# Patient Record
Sex: Female | Born: 1950 | Race: Black or African American | Hispanic: No | Marital: Single | Smoking: Former smoker
Health system: Southern US, Community
[De-identification: ages and names within clinical notes are randomized; demographics above are authoritative.]

## PROBLEM LIST (undated history)

## (undated) ENCOUNTER — Emergency Department (HOSPITAL_COMMUNITY): Admission: EM | Payer: Medicare Other | Source: Home / Self Care

## (undated) DIAGNOSIS — H9190 Unspecified hearing loss, unspecified ear: Secondary | ICD-10-CM

## (undated) DIAGNOSIS — R531 Weakness: Secondary | ICD-10-CM

## (undated) DIAGNOSIS — M8580 Other specified disorders of bone density and structure, unspecified site: Secondary | ICD-10-CM

## (undated) DIAGNOSIS — F79 Unspecified intellectual disabilities: Secondary | ICD-10-CM

## (undated) DIAGNOSIS — I951 Orthostatic hypotension: Secondary | ICD-10-CM

## (undated) DIAGNOSIS — N183 Chronic kidney disease, stage 3 unspecified: Secondary | ICD-10-CM

## (undated) DIAGNOSIS — I5030 Unspecified diastolic (congestive) heart failure: Secondary | ICD-10-CM

## (undated) DIAGNOSIS — K08109 Complete loss of teeth, unspecified cause, unspecified class: Secondary | ICD-10-CM

## (undated) DIAGNOSIS — F419 Anxiety disorder, unspecified: Secondary | ICD-10-CM

## (undated) DIAGNOSIS — F32A Depression, unspecified: Secondary | ICD-10-CM

## (undated) DIAGNOSIS — T466X5A Adverse effect of antihyperlipidemic and antiarteriosclerotic drugs, initial encounter: Secondary | ICD-10-CM

## (undated) DIAGNOSIS — R569 Unspecified convulsions: Secondary | ICD-10-CM

## (undated) DIAGNOSIS — R112 Nausea with vomiting, unspecified: Secondary | ICD-10-CM

## (undated) DIAGNOSIS — I69398 Other sequelae of cerebral infarction: Secondary | ICD-10-CM

## (undated) DIAGNOSIS — N184 Chronic kidney disease, stage 4 (severe): Secondary | ICD-10-CM

## (undated) DIAGNOSIS — Z800 Family history of malignant neoplasm of digestive organs: Secondary | ICD-10-CM

## (undated) DIAGNOSIS — E785 Hyperlipidemia, unspecified: Secondary | ICD-10-CM

## (undated) DIAGNOSIS — R001 Bradycardia, unspecified: Secondary | ICD-10-CM

## (undated) DIAGNOSIS — I6529 Occlusion and stenosis of unspecified carotid artery: Secondary | ICD-10-CM

## (undated) DIAGNOSIS — F4310 Post-traumatic stress disorder, unspecified: Secondary | ICD-10-CM

## (undated) DIAGNOSIS — M791 Myalgia, unspecified site: Secondary | ICD-10-CM

## (undated) DIAGNOSIS — H539 Unspecified visual disturbance: Secondary | ICD-10-CM

## (undated) DIAGNOSIS — I639 Cerebral infarction, unspecified: Secondary | ICD-10-CM

## (undated) DIAGNOSIS — M109 Gout, unspecified: Secondary | ICD-10-CM

## (undated) DIAGNOSIS — G4733 Obstructive sleep apnea (adult) (pediatric): Secondary | ICD-10-CM

## (undated) DIAGNOSIS — I10 Essential (primary) hypertension: Secondary | ICD-10-CM

## (undated) DIAGNOSIS — I2510 Atherosclerotic heart disease of native coronary artery without angina pectoris: Secondary | ICD-10-CM

## (undated) DIAGNOSIS — R55 Syncope and collapse: Secondary | ICD-10-CM

## (undated) DIAGNOSIS — I493 Ventricular premature depolarization: Secondary | ICD-10-CM

## (undated) DIAGNOSIS — K279 Peptic ulcer, site unspecified, unspecified as acute or chronic, without hemorrhage or perforation: Secondary | ICD-10-CM

## (undated) DIAGNOSIS — E119 Type 2 diabetes mellitus without complications: Secondary | ICD-10-CM

## (undated) DIAGNOSIS — Z9889 Other specified postprocedural states: Secondary | ICD-10-CM

## (undated) DIAGNOSIS — I509 Heart failure, unspecified: Secondary | ICD-10-CM

## (undated) HISTORY — DX: Unspecified diastolic (congestive) heart failure: I50.30

## (undated) HISTORY — DX: Other specified disorders of bone density and structure, unspecified site: M85.80

## (undated) HISTORY — DX: Family history of malignant neoplasm of digestive organs: Z80.0

## (undated) HISTORY — PX: DENTAL SURGERY: SHX609

## (undated) HISTORY — PX: BLADDER SURGERY: SHX569

## (undated) HISTORY — DX: Chronic kidney disease, stage 4 (severe): N18.4

## (undated) HISTORY — PX: CARDIAC CATHETERIZATION: SHX172

## (undated) HISTORY — PX: CORONARY ANGIOPLASTY WITH STENT PLACEMENT: SHX49

## (undated) HISTORY — PX: OTHER SURGICAL HISTORY: SHX169

## (undated) HISTORY — DX: Gout, unspecified: M10.9

## (undated) HISTORY — DX: Hyperlipidemia, unspecified: E78.5

---

## 1997-11-08 ENCOUNTER — Inpatient Hospital Stay (HOSPITAL_COMMUNITY): Admission: EM | Admit: 1997-11-08 | Discharge: 1997-11-09 | Payer: Self-pay | Admitting: Emergency Medicine

## 1998-03-25 ENCOUNTER — Encounter: Payer: Self-pay | Admitting: Emergency Medicine

## 1998-03-25 ENCOUNTER — Inpatient Hospital Stay (HOSPITAL_COMMUNITY): Admission: EM | Admit: 1998-03-25 | Discharge: 1998-03-26 | Payer: Self-pay | Admitting: Emergency Medicine

## 1998-06-15 ENCOUNTER — Encounter: Admission: RE | Admit: 1998-06-15 | Discharge: 1998-06-15 | Payer: Self-pay | Admitting: Internal Medicine

## 1998-06-29 ENCOUNTER — Encounter: Admission: RE | Admit: 1998-06-29 | Discharge: 1998-06-29 | Payer: Self-pay | Admitting: Hematology and Oncology

## 1998-10-14 ENCOUNTER — Encounter: Admission: RE | Admit: 1998-10-14 | Discharge: 1998-10-14 | Payer: Self-pay | Admitting: Internal Medicine

## 1998-10-21 ENCOUNTER — Encounter: Admission: RE | Admit: 1998-10-21 | Discharge: 1998-10-21 | Payer: Self-pay | Admitting: Internal Medicine

## 1999-03-18 ENCOUNTER — Emergency Department (HOSPITAL_COMMUNITY): Admission: EM | Admit: 1999-03-18 | Discharge: 1999-03-18 | Payer: Self-pay | Admitting: Emergency Medicine

## 1999-05-16 ENCOUNTER — Emergency Department (HOSPITAL_COMMUNITY): Admission: EM | Admit: 1999-05-16 | Discharge: 1999-05-16 | Payer: Self-pay | Admitting: Emergency Medicine

## 1999-11-07 ENCOUNTER — Ambulatory Visit (HOSPITAL_COMMUNITY): Admission: RE | Admit: 1999-11-07 | Discharge: 1999-11-07 | Payer: Self-pay | Admitting: Internal Medicine

## 1999-11-13 ENCOUNTER — Encounter: Payer: Self-pay | Admitting: Internal Medicine

## 1999-11-13 ENCOUNTER — Ambulatory Visit (HOSPITAL_COMMUNITY): Admission: RE | Admit: 1999-11-13 | Discharge: 1999-11-13 | Payer: Self-pay | Admitting: Internal Medicine

## 1999-11-27 ENCOUNTER — Encounter: Admission: RE | Admit: 1999-11-27 | Discharge: 1999-11-27 | Payer: Self-pay | Admitting: Internal Medicine

## 2000-06-01 ENCOUNTER — Emergency Department (HOSPITAL_COMMUNITY): Admission: EM | Admit: 2000-06-01 | Discharge: 2000-06-01 | Payer: Self-pay | Admitting: Emergency Medicine

## 2000-06-11 ENCOUNTER — Encounter: Payer: Self-pay | Admitting: Emergency Medicine

## 2000-06-11 ENCOUNTER — Inpatient Hospital Stay (HOSPITAL_COMMUNITY): Admission: EM | Admit: 2000-06-11 | Discharge: 2000-06-14 | Payer: Self-pay | Admitting: Emergency Medicine

## 2000-06-12 ENCOUNTER — Encounter: Payer: Self-pay | Admitting: Internal Medicine

## 2000-07-25 ENCOUNTER — Encounter: Admission: RE | Admit: 2000-07-25 | Discharge: 2000-07-25 | Payer: Self-pay | Admitting: Internal Medicine

## 2001-08-01 ENCOUNTER — Encounter: Payer: Self-pay | Admitting: Emergency Medicine

## 2001-08-01 ENCOUNTER — Emergency Department (HOSPITAL_COMMUNITY): Admission: EM | Admit: 2001-08-01 | Discharge: 2001-08-01 | Payer: Self-pay | Admitting: Emergency Medicine

## 2001-11-03 ENCOUNTER — Encounter: Admission: RE | Admit: 2001-11-03 | Discharge: 2001-11-03 | Payer: Self-pay | Admitting: Internal Medicine

## 2001-11-03 ENCOUNTER — Encounter: Payer: Self-pay | Admitting: Internal Medicine

## 2001-11-03 ENCOUNTER — Ambulatory Visit (HOSPITAL_COMMUNITY): Admission: RE | Admit: 2001-11-03 | Discharge: 2001-11-03 | Payer: Self-pay | Admitting: Internal Medicine

## 2001-12-28 ENCOUNTER — Emergency Department (HOSPITAL_COMMUNITY): Admission: EM | Admit: 2001-12-28 | Discharge: 2001-12-28 | Payer: Self-pay | Admitting: Emergency Medicine

## 2002-09-21 ENCOUNTER — Inpatient Hospital Stay (HOSPITAL_COMMUNITY): Admission: EM | Admit: 2002-09-21 | Discharge: 2002-09-23 | Payer: Self-pay | Admitting: Emergency Medicine

## 2002-09-21 ENCOUNTER — Encounter: Payer: Self-pay | Admitting: Emergency Medicine

## 2002-09-21 ENCOUNTER — Encounter: Payer: Self-pay | Admitting: *Deleted

## 2002-09-22 ENCOUNTER — Encounter: Payer: Self-pay | Admitting: Internal Medicine

## 2004-02-06 ENCOUNTER — Emergency Department: Payer: Self-pay | Admitting: Emergency Medicine

## 2004-02-06 ENCOUNTER — Other Ambulatory Visit: Payer: Self-pay

## 2004-02-11 ENCOUNTER — Ambulatory Visit: Payer: Self-pay | Admitting: Internal Medicine

## 2004-02-11 ENCOUNTER — Ambulatory Visit (HOSPITAL_COMMUNITY): Admission: RE | Admit: 2004-02-11 | Discharge: 2004-02-11 | Payer: Self-pay | Admitting: Internal Medicine

## 2004-02-21 ENCOUNTER — Ambulatory Visit: Payer: Self-pay | Admitting: Internal Medicine

## 2004-03-20 ENCOUNTER — Encounter (INDEPENDENT_AMBULATORY_CARE_PROVIDER_SITE_OTHER): Payer: Self-pay | Admitting: Internal Medicine

## 2004-03-20 ENCOUNTER — Ambulatory Visit: Payer: Self-pay | Admitting: Internal Medicine

## 2004-03-21 ENCOUNTER — Encounter: Admission: RE | Admit: 2004-03-21 | Discharge: 2004-05-12 | Payer: Self-pay | Admitting: Internal Medicine

## 2004-06-12 ENCOUNTER — Ambulatory Visit (HOSPITAL_COMMUNITY): Admission: RE | Admit: 2004-06-12 | Discharge: 2004-06-12 | Payer: Self-pay | Admitting: Internal Medicine

## 2004-06-12 ENCOUNTER — Ambulatory Visit: Payer: Self-pay | Admitting: Internal Medicine

## 2004-06-21 ENCOUNTER — Ambulatory Visit (HOSPITAL_COMMUNITY): Admission: RE | Admit: 2004-06-21 | Discharge: 2004-06-21 | Payer: Self-pay | Admitting: Internal Medicine

## 2005-01-07 ENCOUNTER — Emergency Department: Payer: Self-pay | Admitting: Emergency Medicine

## 2005-04-10 ENCOUNTER — Other Ambulatory Visit: Payer: Self-pay

## 2005-04-10 ENCOUNTER — Emergency Department: Payer: Self-pay | Admitting: Emergency Medicine

## 2005-08-31 ENCOUNTER — Ambulatory Visit: Payer: Self-pay | Admitting: Internal Medicine

## 2005-09-05 ENCOUNTER — Ambulatory Visit: Payer: Self-pay | Admitting: Internal Medicine

## 2005-09-07 ENCOUNTER — Ambulatory Visit: Payer: Self-pay | Admitting: Internal Medicine

## 2005-09-10 ENCOUNTER — Encounter (INDEPENDENT_AMBULATORY_CARE_PROVIDER_SITE_OTHER): Payer: Self-pay | Admitting: Internal Medicine

## 2005-09-10 ENCOUNTER — Ambulatory Visit (HOSPITAL_COMMUNITY): Admission: RE | Admit: 2005-09-10 | Discharge: 2005-09-10 | Payer: Self-pay | Admitting: Internal Medicine

## 2005-09-17 ENCOUNTER — Ambulatory Visit: Payer: Self-pay | Admitting: Internal Medicine

## 2006-03-04 DIAGNOSIS — E669 Obesity, unspecified: Secondary | ICD-10-CM

## 2006-03-04 DIAGNOSIS — K219 Gastro-esophageal reflux disease without esophagitis: Secondary | ICD-10-CM | POA: Insufficient documentation

## 2006-03-04 DIAGNOSIS — I10 Essential (primary) hypertension: Secondary | ICD-10-CM | POA: Insufficient documentation

## 2006-03-04 DIAGNOSIS — M25569 Pain in unspecified knee: Secondary | ICD-10-CM | POA: Insufficient documentation

## 2006-03-04 DIAGNOSIS — K279 Peptic ulcer, site unspecified, unspecified as acute or chronic, without hemorrhage or perforation: Secondary | ICD-10-CM

## 2006-03-04 DIAGNOSIS — N951 Menopausal and female climacteric states: Secondary | ICD-10-CM | POA: Insufficient documentation

## 2006-03-04 DIAGNOSIS — M25559 Pain in unspecified hip: Secondary | ICD-10-CM | POA: Insufficient documentation

## 2006-03-04 DIAGNOSIS — Z8711 Personal history of peptic ulcer disease: Secondary | ICD-10-CM | POA: Insufficient documentation

## 2006-03-04 DIAGNOSIS — F411 Generalized anxiety disorder: Secondary | ICD-10-CM | POA: Insufficient documentation

## 2011-10-19 ENCOUNTER — Emergency Department: Payer: Self-pay | Admitting: Emergency Medicine

## 2013-01-06 ENCOUNTER — Emergency Department: Payer: Self-pay | Admitting: Emergency Medicine

## 2013-01-09 ENCOUNTER — Emergency Department: Payer: Self-pay | Admitting: Emergency Medicine

## 2013-01-13 ENCOUNTER — Emergency Department: Payer: Self-pay | Admitting: Internal Medicine

## 2014-04-11 ENCOUNTER — Inpatient Hospital Stay: Payer: Self-pay | Admitting: Internal Medicine

## 2014-04-11 DIAGNOSIS — N2889 Other specified disorders of kidney and ureter: Secondary | ICD-10-CM | POA: Diagnosis not present

## 2014-04-11 DIAGNOSIS — D649 Anemia, unspecified: Secondary | ICD-10-CM | POA: Diagnosis not present

## 2014-04-11 DIAGNOSIS — I10 Essential (primary) hypertension: Secondary | ICD-10-CM | POA: Diagnosis not present

## 2014-04-11 DIAGNOSIS — Z720 Tobacco use: Secondary | ICD-10-CM | POA: Diagnosis not present

## 2014-04-11 DIAGNOSIS — R197 Diarrhea, unspecified: Secondary | ICD-10-CM | POA: Diagnosis not present

## 2014-04-11 DIAGNOSIS — N139 Obstructive and reflux uropathy, unspecified: Secondary | ICD-10-CM | POA: Diagnosis not present

## 2014-04-11 DIAGNOSIS — N320 Bladder-neck obstruction: Secondary | ICD-10-CM | POA: Diagnosis not present

## 2014-04-11 DIAGNOSIS — R1030 Lower abdominal pain, unspecified: Secondary | ICD-10-CM | POA: Diagnosis not present

## 2014-04-11 DIAGNOSIS — R112 Nausea with vomiting, unspecified: Secondary | ICD-10-CM | POA: Diagnosis not present

## 2014-04-11 DIAGNOSIS — N179 Acute kidney failure, unspecified: Secondary | ICD-10-CM | POA: Diagnosis not present

## 2014-04-11 LAB — COMPREHENSIVE METABOLIC PANEL
ALT: 21 U/L
ANION GAP: 10 (ref 7–16)
Albumin: 3.9 g/dL (ref 3.4–5.0)
Alkaline Phosphatase: 68 U/L
BILIRUBIN TOTAL: 0.5 mg/dL (ref 0.2–1.0)
BUN: 56 mg/dL — AB (ref 7–18)
CALCIUM: 9.9 mg/dL (ref 8.5–10.1)
CREATININE: 5.92 mg/dL — AB (ref 0.60–1.30)
Chloride: 115 mmol/L — ABNORMAL HIGH (ref 98–107)
Co2: 18 mmol/L — ABNORMAL LOW (ref 21–32)
EGFR (African American): 9 — ABNORMAL LOW
GFR CALC NON AF AMER: 8 — AB
GLUCOSE: 88 mg/dL (ref 65–99)
Osmolality: 300 (ref 275–301)
Potassium: 5.1 mmol/L (ref 3.5–5.1)
SGOT(AST): 20 U/L (ref 15–37)
SODIUM: 143 mmol/L (ref 136–145)
Total Protein: 8 g/dL (ref 6.4–8.2)

## 2014-04-11 LAB — LIPASE, BLOOD: LIPASE: 197 U/L (ref 73–393)

## 2014-04-11 LAB — URINALYSIS, COMPLETE
Bilirubin,UR: NEGATIVE
Glucose,UR: NEGATIVE mg/dL (ref 0–75)
Ketone: NEGATIVE
Nitrite: NEGATIVE
PH: 7 (ref 4.5–8.0)
Protein: NEGATIVE
SPECIFIC GRAVITY: 1.005 (ref 1.003–1.030)
Squamous Epithelial: 3

## 2014-04-11 LAB — CBC WITH DIFFERENTIAL/PLATELET
BASOS PCT: 0.8 %
Basophil #: 0 10*3/uL (ref 0.0–0.1)
EOS PCT: 1.6 %
Eosinophil #: 0.1 10*3/uL (ref 0.0–0.7)
HCT: 32.9 % — ABNORMAL LOW (ref 35.0–47.0)
HGB: 10.7 g/dL — AB (ref 12.0–16.0)
LYMPHS PCT: 22.7 %
Lymphocyte #: 0.8 10*3/uL — ABNORMAL LOW (ref 1.0–3.6)
MCH: 31.9 pg (ref 26.0–34.0)
MCHC: 32.6 g/dL (ref 32.0–36.0)
MCV: 98 fL (ref 80–100)
Monocyte #: 0.3 x10 3/mm (ref 0.2–0.9)
Monocyte %: 9.1 %
NEUTROS PCT: 65.8 %
Neutrophil #: 2.3 10*3/uL (ref 1.4–6.5)
PLATELETS: 151 10*3/uL (ref 150–440)
RBC: 3.36 10*6/uL — ABNORMAL LOW (ref 3.80–5.20)
RDW: 12.5 % (ref 11.5–14.5)
WBC: 3.5 10*3/uL — ABNORMAL LOW (ref 3.6–11.0)

## 2014-04-12 DIAGNOSIS — N179 Acute kidney failure, unspecified: Secondary | ICD-10-CM | POA: Diagnosis not present

## 2014-04-12 DIAGNOSIS — N320 Bladder-neck obstruction: Secondary | ICD-10-CM | POA: Diagnosis not present

## 2014-04-12 DIAGNOSIS — R9431 Abnormal electrocardiogram [ECG] [EKG]: Secondary | ICD-10-CM | POA: Diagnosis not present

## 2014-04-12 DIAGNOSIS — D649 Anemia, unspecified: Secondary | ICD-10-CM | POA: Diagnosis not present

## 2014-04-12 DIAGNOSIS — I10 Essential (primary) hypertension: Secondary | ICD-10-CM | POA: Diagnosis not present

## 2014-04-12 LAB — CK TOTAL AND CKMB (NOT AT ARMC)
CK, TOTAL: 88 U/L (ref 26–192)
CK-MB: 2.5 ng/mL (ref 0.5–3.6)

## 2014-04-12 LAB — BASIC METABOLIC PANEL
ANION GAP: 8 (ref 7–16)
BUN: 44 mg/dL — ABNORMAL HIGH (ref 7–18)
CALCIUM: 9 mg/dL (ref 8.5–10.1)
CHLORIDE: 115 mmol/L — AB (ref 98–107)
CREATININE: 4.4 mg/dL — AB (ref 0.60–1.30)
Co2: 21 mmol/L (ref 21–32)
EGFR (African American): 13 — ABNORMAL LOW
GFR CALC NON AF AMER: 11 — AB
Glucose: 86 mg/dL (ref 65–99)
OSMOLALITY: 297 (ref 275–301)
POTASSIUM: 4.9 mmol/L (ref 3.5–5.1)
Sodium: 144 mmol/L (ref 136–145)

## 2014-04-12 LAB — CBC WITH DIFFERENTIAL/PLATELET
BASOS ABS: 0 10*3/uL (ref 0.0–0.1)
Basophil %: 0.7 %
Eosinophil #: 0.1 10*3/uL (ref 0.0–0.7)
Eosinophil %: 2.1 %
HCT: 32.3 % — ABNORMAL LOW (ref 35.0–47.0)
HGB: 10.6 g/dL — AB (ref 12.0–16.0)
LYMPHS PCT: 19.1 %
Lymphocyte #: 0.9 10*3/uL — ABNORMAL LOW (ref 1.0–3.6)
MCH: 32 pg (ref 26.0–34.0)
MCHC: 32.8 g/dL (ref 32.0–36.0)
MCV: 98 fL (ref 80–100)
MONOS PCT: 6.8 %
Monocyte #: 0.3 x10 3/mm (ref 0.2–0.9)
Neutrophil #: 3.2 10*3/uL (ref 1.4–6.5)
Neutrophil %: 71.3 %
PLATELETS: 164 10*3/uL (ref 150–440)
RBC: 3.3 10*6/uL — ABNORMAL LOW (ref 3.80–5.20)
RDW: 12.6 % (ref 11.5–14.5)
WBC: 4.5 10*3/uL (ref 3.6–11.0)

## 2014-04-12 LAB — CLOSTRIDIUM DIFFICILE(ARMC)

## 2014-04-12 LAB — TROPONIN I
TROPONIN-I: 0.03 ng/mL
TROPONIN-I: 0.03 ng/mL
Troponin-I: 0.03 ng/mL

## 2014-04-13 DIAGNOSIS — N320 Bladder-neck obstruction: Secondary | ICD-10-CM | POA: Diagnosis not present

## 2014-04-13 DIAGNOSIS — I10 Essential (primary) hypertension: Secondary | ICD-10-CM | POA: Diagnosis not present

## 2014-04-13 DIAGNOSIS — R198 Other specified symptoms and signs involving the digestive system and abdomen: Secondary | ICD-10-CM | POA: Diagnosis not present

## 2014-04-13 DIAGNOSIS — N1330 Unspecified hydronephrosis: Secondary | ICD-10-CM | POA: Diagnosis not present

## 2014-04-13 DIAGNOSIS — D649 Anemia, unspecified: Secondary | ICD-10-CM | POA: Diagnosis not present

## 2014-04-13 DIAGNOSIS — N179 Acute kidney failure, unspecified: Secondary | ICD-10-CM | POA: Diagnosis not present

## 2014-04-13 LAB — CBC WITH DIFFERENTIAL/PLATELET
Basophil #: 0 10*3/uL (ref 0.0–0.1)
Basophil %: 0.5 %
EOS PCT: 2 %
Eosinophil #: 0.1 10*3/uL (ref 0.0–0.7)
HCT: 31.8 % — ABNORMAL LOW (ref 35.0–47.0)
HGB: 10.3 g/dL — ABNORMAL LOW (ref 12.0–16.0)
Lymphocyte #: 1.1 10*3/uL (ref 1.0–3.6)
Lymphocyte %: 24 %
MCH: 31.7 pg (ref 26.0–34.0)
MCHC: 32.3 g/dL (ref 32.0–36.0)
MCV: 98 fL (ref 80–100)
MONOS PCT: 8.5 %
Monocyte #: 0.4 x10 3/mm (ref 0.2–0.9)
NEUTROS PCT: 65 %
Neutrophil #: 3 10*3/uL (ref 1.4–6.5)
Platelet: 149 10*3/uL — ABNORMAL LOW (ref 150–440)
RBC: 3.24 10*6/uL — ABNORMAL LOW (ref 3.80–5.20)
RDW: 12.7 % (ref 11.5–14.5)
WBC: 4.6 10*3/uL (ref 3.6–11.0)

## 2014-04-13 LAB — BASIC METABOLIC PANEL
Anion Gap: 8 (ref 7–16)
BUN: 29 mg/dL — AB (ref 7–18)
CALCIUM: 9.1 mg/dL (ref 8.5–10.1)
Chloride: 115 mmol/L — ABNORMAL HIGH (ref 98–107)
Co2: 20 mmol/L — ABNORMAL LOW (ref 21–32)
Creatinine: 3.3 mg/dL — ABNORMAL HIGH (ref 0.60–1.30)
EGFR (African American): 18 — ABNORMAL LOW
EGFR (Non-African Amer.): 15 — ABNORMAL LOW
Glucose: 90 mg/dL (ref 65–99)
OSMOLALITY: 290 (ref 275–301)
POTASSIUM: 4.8 mmol/L (ref 3.5–5.1)
Sodium: 143 mmol/L (ref 136–145)

## 2014-04-13 LAB — URINE CULTURE

## 2014-04-14 DIAGNOSIS — D649 Anemia, unspecified: Secondary | ICD-10-CM | POA: Diagnosis not present

## 2014-04-14 DIAGNOSIS — I10 Essential (primary) hypertension: Secondary | ICD-10-CM | POA: Diagnosis not present

## 2014-04-14 DIAGNOSIS — N179 Acute kidney failure, unspecified: Secondary | ICD-10-CM | POA: Diagnosis not present

## 2014-04-14 DIAGNOSIS — N320 Bladder-neck obstruction: Secondary | ICD-10-CM | POA: Diagnosis not present

## 2014-04-14 DIAGNOSIS — Z720 Tobacco use: Secondary | ICD-10-CM | POA: Diagnosis not present

## 2014-04-14 LAB — BASIC METABOLIC PANEL
Anion Gap: 6 — ABNORMAL LOW (ref 7–16)
BUN: 22 mg/dL — ABNORMAL HIGH (ref 7–18)
Calcium, Total: 8.7 mg/dL (ref 8.5–10.1)
Chloride: 113 mmol/L — ABNORMAL HIGH (ref 98–107)
Co2: 23 mmol/L (ref 21–32)
Creatinine: 2.51 mg/dL — ABNORMAL HIGH (ref 0.60–1.30)
EGFR (African American): 25 — ABNORMAL LOW
GFR CALC NON AF AMER: 21 — AB
Glucose: 84 mg/dL (ref 65–99)
OSMOLALITY: 286 (ref 275–301)
POTASSIUM: 4.1 mmol/L (ref 3.5–5.1)
Sodium: 142 mmol/L (ref 136–145)

## 2014-04-14 LAB — CBC WITH DIFFERENTIAL/PLATELET
Basophil #: 0 10*3/uL (ref 0.0–0.1)
Basophil %: 0.4 %
Eosinophil #: 0.1 10*3/uL (ref 0.0–0.7)
Eosinophil %: 3.7 %
HCT: 32.7 % — ABNORMAL LOW (ref 35.0–47.0)
HGB: 10.6 g/dL — ABNORMAL LOW (ref 12.0–16.0)
Lymphocyte #: 1 10*3/uL (ref 1.0–3.6)
Lymphocyte %: 24.9 %
MCH: 31.5 pg (ref 26.0–34.0)
MCHC: 32.6 g/dL (ref 32.0–36.0)
MCV: 97 fL (ref 80–100)
Monocyte #: 0.3 x10 3/mm (ref 0.2–0.9)
Monocyte %: 7.7 %
NEUTROS ABS: 2.4 10*3/uL (ref 1.4–6.5)
Neutrophil %: 63.3 %
PLATELETS: 175 10*3/uL (ref 150–440)
RBC: 3.38 10*6/uL — AB (ref 3.80–5.20)
RDW: 12.1 % (ref 11.5–14.5)
WBC: 3.8 10*3/uL (ref 3.6–11.0)

## 2014-04-14 LAB — WBCS, STOOL

## 2014-04-15 DIAGNOSIS — D649 Anemia, unspecified: Secondary | ICD-10-CM | POA: Diagnosis not present

## 2014-04-15 DIAGNOSIS — Z720 Tobacco use: Secondary | ICD-10-CM | POA: Diagnosis not present

## 2014-04-15 DIAGNOSIS — I10 Essential (primary) hypertension: Secondary | ICD-10-CM | POA: Diagnosis not present

## 2014-04-15 DIAGNOSIS — N320 Bladder-neck obstruction: Secondary | ICD-10-CM | POA: Diagnosis not present

## 2014-04-15 DIAGNOSIS — N179 Acute kidney failure, unspecified: Secondary | ICD-10-CM | POA: Diagnosis not present

## 2014-04-15 LAB — BASIC METABOLIC PANEL
Anion Gap: 6 — ABNORMAL LOW (ref 7–16)
BUN: 21 mg/dL — ABNORMAL HIGH (ref 7–18)
CALCIUM: 9.2 mg/dL (ref 8.5–10.1)
CHLORIDE: 113 mmol/L — AB (ref 98–107)
CREATININE: 2.37 mg/dL — AB (ref 0.60–1.30)
Co2: 22 mmol/L (ref 21–32)
EGFR (African American): 27 — ABNORMAL LOW
GFR CALC NON AF AMER: 22 — AB
GLUCOSE: 107 mg/dL — AB (ref 65–99)
OSMOLALITY: 285 (ref 275–301)
Potassium: 4.2 mmol/L (ref 3.5–5.1)
Sodium: 141 mmol/L (ref 136–145)

## 2014-04-16 LAB — STOOL CULTURE

## 2014-04-21 ENCOUNTER — Ambulatory Visit: Payer: Self-pay | Admitting: Nephrology

## 2014-04-21 DIAGNOSIS — R339 Retention of urine, unspecified: Secondary | ICD-10-CM | POA: Diagnosis not present

## 2014-04-21 DIAGNOSIS — N179 Acute kidney failure, unspecified: Secondary | ICD-10-CM | POA: Diagnosis not present

## 2014-04-21 LAB — BASIC METABOLIC PANEL
Anion Gap: 4 — ABNORMAL LOW (ref 7–16)
BUN: 28 mg/dL — AB (ref 7–18)
CHLORIDE: 108 mmol/L — AB (ref 98–107)
Calcium, Total: 9.7 mg/dL (ref 8.5–10.1)
Co2: 26 mmol/L (ref 21–32)
Creatinine: 1.95 mg/dL — ABNORMAL HIGH (ref 0.60–1.30)
EGFR (African American): 33 — ABNORMAL LOW
EGFR (Non-African Amer.): 27 — ABNORMAL LOW
GLUCOSE: 104 mg/dL — AB (ref 65–99)
Osmolality: 281 (ref 275–301)
Potassium: 4.4 mmol/L (ref 3.5–5.1)
Sodium: 138 mmol/L (ref 136–145)

## 2014-05-10 DIAGNOSIS — R339 Retention of urine, unspecified: Secondary | ICD-10-CM | POA: Diagnosis not present

## 2014-05-10 DIAGNOSIS — N184 Chronic kidney disease, stage 4 (severe): Secondary | ICD-10-CM | POA: Diagnosis not present

## 2014-05-10 DIAGNOSIS — I10 Essential (primary) hypertension: Secondary | ICD-10-CM | POA: Diagnosis not present

## 2014-05-10 DIAGNOSIS — I509 Heart failure, unspecified: Secondary | ICD-10-CM | POA: Diagnosis not present

## 2014-05-10 DIAGNOSIS — E119 Type 2 diabetes mellitus without complications: Secondary | ICD-10-CM | POA: Diagnosis not present

## 2014-05-12 DIAGNOSIS — N179 Acute kidney failure, unspecified: Secondary | ICD-10-CM | POA: Diagnosis not present

## 2014-05-12 DIAGNOSIS — E1129 Type 2 diabetes mellitus with other diabetic kidney complication: Secondary | ICD-10-CM | POA: Diagnosis not present

## 2014-05-12 DIAGNOSIS — N390 Urinary tract infection, site not specified: Secondary | ICD-10-CM | POA: Diagnosis not present

## 2014-05-12 DIAGNOSIS — D649 Anemia, unspecified: Secondary | ICD-10-CM | POA: Diagnosis not present

## 2014-05-12 DIAGNOSIS — E559 Vitamin D deficiency, unspecified: Secondary | ICD-10-CM | POA: Diagnosis not present

## 2014-05-12 DIAGNOSIS — I10 Essential (primary) hypertension: Secondary | ICD-10-CM | POA: Diagnosis not present

## 2014-05-12 DIAGNOSIS — N189 Chronic kidney disease, unspecified: Secondary | ICD-10-CM | POA: Diagnosis not present

## 2014-05-25 DIAGNOSIS — N179 Acute kidney failure, unspecified: Secondary | ICD-10-CM | POA: Diagnosis not present

## 2014-05-25 DIAGNOSIS — Z466 Encounter for fitting and adjustment of urinary device: Secondary | ICD-10-CM | POA: Diagnosis not present

## 2014-05-25 DIAGNOSIS — R339 Retention of urine, unspecified: Secondary | ICD-10-CM | POA: Diagnosis not present

## 2014-05-31 DIAGNOSIS — R531 Weakness: Secondary | ICD-10-CM

## 2014-05-31 HISTORY — DX: Weakness: R53.1

## 2014-06-03 DIAGNOSIS — R339 Retention of urine, unspecified: Secondary | ICD-10-CM | POA: Diagnosis not present

## 2014-06-08 DIAGNOSIS — N2581 Secondary hyperparathyroidism of renal origin: Secondary | ICD-10-CM | POA: Diagnosis not present

## 2014-06-08 DIAGNOSIS — N183 Chronic kidney disease, stage 3 (moderate): Secondary | ICD-10-CM | POA: Diagnosis not present

## 2014-06-08 DIAGNOSIS — I10 Essential (primary) hypertension: Secondary | ICD-10-CM | POA: Diagnosis not present

## 2014-06-08 DIAGNOSIS — D631 Anemia in chronic kidney disease: Secondary | ICD-10-CM | POA: Diagnosis not present

## 2014-06-16 ENCOUNTER — Emergency Department: Payer: Self-pay | Admitting: Emergency Medicine

## 2014-06-16 DIAGNOSIS — Z79899 Other long term (current) drug therapy: Secondary | ICD-10-CM | POA: Diagnosis not present

## 2014-06-16 DIAGNOSIS — Z87891 Personal history of nicotine dependence: Secondary | ICD-10-CM | POA: Diagnosis not present

## 2014-06-16 DIAGNOSIS — E119 Type 2 diabetes mellitus without complications: Secondary | ICD-10-CM | POA: Diagnosis not present

## 2014-06-16 DIAGNOSIS — R404 Transient alteration of awareness: Secondary | ICD-10-CM | POA: Diagnosis not present

## 2014-06-16 DIAGNOSIS — I10 Essential (primary) hypertension: Secondary | ICD-10-CM | POA: Diagnosis not present

## 2014-06-16 DIAGNOSIS — R42 Dizziness and giddiness: Secondary | ICD-10-CM | POA: Diagnosis not present

## 2014-06-22 ENCOUNTER — Inpatient Hospital Stay: Payer: Self-pay | Admitting: Internal Medicine

## 2014-06-22 DIAGNOSIS — I10 Essential (primary) hypertension: Secondary | ICD-10-CM | POA: Diagnosis not present

## 2014-06-22 DIAGNOSIS — N390 Urinary tract infection, site not specified: Secondary | ICD-10-CM | POA: Diagnosis not present

## 2014-06-22 DIAGNOSIS — R42 Dizziness and giddiness: Secondary | ICD-10-CM | POA: Diagnosis not present

## 2014-06-22 DIAGNOSIS — R29818 Other symptoms and signs involving the nervous system: Secondary | ICD-10-CM | POA: Diagnosis not present

## 2014-06-22 DIAGNOSIS — I6523 Occlusion and stenosis of bilateral carotid arteries: Secondary | ICD-10-CM | POA: Diagnosis not present

## 2014-06-22 DIAGNOSIS — I63532 Cerebral infarction due to unspecified occlusion or stenosis of left posterior cerebral artery: Secondary | ICD-10-CM

## 2014-06-22 DIAGNOSIS — I5030 Unspecified diastolic (congestive) heart failure: Secondary | ICD-10-CM | POA: Diagnosis not present

## 2014-06-22 DIAGNOSIS — I639 Cerebral infarction, unspecified: Secondary | ICD-10-CM | POA: Diagnosis not present

## 2014-06-22 DIAGNOSIS — R531 Weakness: Secondary | ICD-10-CM | POA: Diagnosis not present

## 2014-06-22 HISTORY — DX: Cerebral infarction due to unspecified occlusion or stenosis of left posterior cerebral artery: I63.532

## 2014-06-23 ENCOUNTER — Ambulatory Visit: Payer: Self-pay | Admitting: Neurology

## 2014-06-23 DIAGNOSIS — I639 Cerebral infarction, unspecified: Secondary | ICD-10-CM | POA: Diagnosis not present

## 2014-06-23 DIAGNOSIS — E119 Type 2 diabetes mellitus without complications: Secondary | ICD-10-CM | POA: Diagnosis not present

## 2014-06-23 DIAGNOSIS — I10 Essential (primary) hypertension: Secondary | ICD-10-CM | POA: Diagnosis not present

## 2014-06-23 DIAGNOSIS — N390 Urinary tract infection, site not specified: Secondary | ICD-10-CM | POA: Diagnosis not present

## 2014-06-24 DIAGNOSIS — I10 Essential (primary) hypertension: Secondary | ICD-10-CM | POA: Diagnosis not present

## 2014-06-24 DIAGNOSIS — N390 Urinary tract infection, site not specified: Secondary | ICD-10-CM | POA: Diagnosis not present

## 2014-06-24 DIAGNOSIS — I639 Cerebral infarction, unspecified: Secondary | ICD-10-CM | POA: Diagnosis not present

## 2014-06-30 ENCOUNTER — Emergency Department: Payer: Self-pay | Admitting: Emergency Medicine

## 2014-06-30 DIAGNOSIS — I639 Cerebral infarction, unspecified: Secondary | ICD-10-CM | POA: Diagnosis not present

## 2014-06-30 DIAGNOSIS — I10 Essential (primary) hypertension: Secondary | ICD-10-CM | POA: Diagnosis not present

## 2014-06-30 DIAGNOSIS — I638 Other cerebral infarction: Secondary | ICD-10-CM | POA: Diagnosis not present

## 2014-06-30 DIAGNOSIS — E119 Type 2 diabetes mellitus without complications: Secondary | ICD-10-CM | POA: Diagnosis not present

## 2014-06-30 DIAGNOSIS — R209 Unspecified disturbances of skin sensation: Secondary | ICD-10-CM | POA: Diagnosis not present

## 2014-07-02 DIAGNOSIS — I10 Essential (primary) hypertension: Secondary | ICD-10-CM | POA: Diagnosis not present

## 2014-07-02 DIAGNOSIS — Z8673 Personal history of transient ischemic attack (TIA), and cerebral infarction without residual deficits: Secondary | ICD-10-CM | POA: Diagnosis not present

## 2014-07-02 DIAGNOSIS — E785 Hyperlipidemia, unspecified: Secondary | ICD-10-CM | POA: Diagnosis not present

## 2014-07-02 DIAGNOSIS — I509 Heart failure, unspecified: Secondary | ICD-10-CM | POA: Diagnosis not present

## 2014-07-02 DIAGNOSIS — I2510 Atherosclerotic heart disease of native coronary artery without angina pectoris: Secondary | ICD-10-CM | POA: Diagnosis not present

## 2014-07-05 DIAGNOSIS — N189 Chronic kidney disease, unspecified: Secondary | ICD-10-CM | POA: Diagnosis not present

## 2014-07-05 DIAGNOSIS — R339 Retention of urine, unspecified: Secondary | ICD-10-CM | POA: Diagnosis not present

## 2014-07-05 DIAGNOSIS — I10 Essential (primary) hypertension: Secondary | ICD-10-CM | POA: Diagnosis not present

## 2014-07-05 DIAGNOSIS — E669 Obesity, unspecified: Secondary | ICD-10-CM | POA: Diagnosis not present

## 2014-07-12 DIAGNOSIS — R339 Retention of urine, unspecified: Secondary | ICD-10-CM | POA: Diagnosis not present

## 2014-07-17 ENCOUNTER — Emergency Department: Payer: Self-pay | Admitting: Emergency Medicine

## 2014-07-17 DIAGNOSIS — R112 Nausea with vomiting, unspecified: Secondary | ICD-10-CM | POA: Diagnosis not present

## 2014-07-17 DIAGNOSIS — R197 Diarrhea, unspecified: Secondary | ICD-10-CM | POA: Diagnosis not present

## 2014-07-17 DIAGNOSIS — I10 Essential (primary) hypertension: Secondary | ICD-10-CM | POA: Diagnosis not present

## 2014-07-17 DIAGNOSIS — N3090 Cystitis, unspecified without hematuria: Secondary | ICD-10-CM | POA: Diagnosis not present

## 2014-07-17 DIAGNOSIS — E119 Type 2 diabetes mellitus without complications: Secondary | ICD-10-CM | POA: Diagnosis not present

## 2014-07-24 ENCOUNTER — Emergency Department: Payer: Self-pay | Admitting: Internal Medicine

## 2014-07-24 DIAGNOSIS — R030 Elevated blood-pressure reading, without diagnosis of hypertension: Secondary | ICD-10-CM | POA: Diagnosis not present

## 2014-07-24 DIAGNOSIS — I10 Essential (primary) hypertension: Secondary | ICD-10-CM | POA: Diagnosis not present

## 2014-07-24 DIAGNOSIS — F419 Anxiety disorder, unspecified: Secondary | ICD-10-CM | POA: Diagnosis not present

## 2014-07-24 DIAGNOSIS — I517 Cardiomegaly: Secondary | ICD-10-CM | POA: Diagnosis not present

## 2014-07-24 DIAGNOSIS — I639 Cerebral infarction, unspecified: Secondary | ICD-10-CM | POA: Diagnosis not present

## 2014-07-24 DIAGNOSIS — R51 Headache: Secondary | ICD-10-CM | POA: Diagnosis not present

## 2014-07-24 DIAGNOSIS — Z79899 Other long term (current) drug therapy: Secondary | ICD-10-CM | POA: Diagnosis not present

## 2014-07-24 DIAGNOSIS — E119 Type 2 diabetes mellitus without complications: Secondary | ICD-10-CM | POA: Diagnosis not present

## 2014-07-24 DIAGNOSIS — R110 Nausea: Secondary | ICD-10-CM | POA: Diagnosis not present

## 2014-07-24 LAB — COMPREHENSIVE METABOLIC PANEL
ALK PHOS: 53 U/L
ALT: 29 U/L
Albumin: 4.1 g/dL
Anion Gap: 9 (ref 7–16)
BUN: 22 mg/dL — ABNORMAL HIGH
Bilirubin,Total: 0.9 mg/dL
CREATININE: 1.6 mg/dL — AB
Calcium, Total: 9.5 mg/dL
Chloride: 107 mmol/L
Co2: 23 mmol/L
EGFR (African American): 39 — ABNORMAL LOW
EGFR (Non-African Amer.): 33 — ABNORMAL LOW
Glucose: 112 mg/dL — ABNORMAL HIGH
Potassium: 4.7 mmol/L
SGOT(AST): 31 U/L
SODIUM: 139 mmol/L
Total Protein: 7.3 g/dL

## 2014-07-24 LAB — URINALYSIS, COMPLETE
Bacteria: NONE SEEN
Bilirubin,UR: NEGATIVE
Blood: NEGATIVE
GLUCOSE, UR: NEGATIVE mg/dL (ref 0–75)
Ketone: NEGATIVE
Leukocyte Esterase: NEGATIVE
Nitrite: NEGATIVE
Ph: 5 (ref 4.5–8.0)
Protein: NEGATIVE
SPECIFIC GRAVITY: 1.008 (ref 1.003–1.030)
Squamous Epithelial: 1

## 2014-07-24 LAB — CBC
HCT: 41.3 % (ref 35.0–47.0)
HGB: 13.4 g/dL (ref 12.0–16.0)
MCH: 31.2 pg (ref 26.0–34.0)
MCHC: 32.4 g/dL (ref 32.0–36.0)
MCV: 96 fL (ref 80–100)
Platelet: 205 10*3/uL (ref 150–440)
RBC: 4.29 10*6/uL (ref 3.80–5.20)
RDW: 12.8 % (ref 11.5–14.5)
WBC: 4.9 10*3/uL (ref 3.6–11.0)

## 2014-07-24 LAB — TROPONIN I

## 2014-08-02 DIAGNOSIS — N3941 Urge incontinence: Secondary | ICD-10-CM | POA: Diagnosis not present

## 2014-08-02 DIAGNOSIS — R339 Retention of urine, unspecified: Secondary | ICD-10-CM | POA: Diagnosis not present

## 2014-08-09 ENCOUNTER — Emergency Department
Admit: 2014-08-09 | Disposition: A | Discharge status: Home / Self Care | Payer: Self-pay | Admitting: Emergency Medicine

## 2014-08-09 DIAGNOSIS — R110 Nausea: Secondary | ICD-10-CM | POA: Diagnosis not present

## 2014-08-09 DIAGNOSIS — J019 Acute sinusitis, unspecified: Secondary | ICD-10-CM | POA: Diagnosis not present

## 2014-08-09 DIAGNOSIS — I6789 Other cerebrovascular disease: Secondary | ICD-10-CM | POA: Diagnosis not present

## 2014-08-09 DIAGNOSIS — I10 Essential (primary) hypertension: Secondary | ICD-10-CM | POA: Diagnosis not present

## 2014-08-09 DIAGNOSIS — J309 Allergic rhinitis, unspecified: Secondary | ICD-10-CM | POA: Diagnosis not present

## 2014-08-09 DIAGNOSIS — R4781 Slurred speech: Secondary | ICD-10-CM | POA: Diagnosis not present

## 2014-08-11 DIAGNOSIS — R0982 Postnasal drip: Secondary | ICD-10-CM | POA: Diagnosis not present

## 2014-08-11 DIAGNOSIS — J309 Allergic rhinitis, unspecified: Secondary | ICD-10-CM | POA: Diagnosis not present

## 2014-08-11 DIAGNOSIS — R110 Nausea: Secondary | ICD-10-CM | POA: Diagnosis not present

## 2014-08-11 DIAGNOSIS — Z8673 Personal history of transient ischemic attack (TIA), and cerebral infarction without residual deficits: Secondary | ICD-10-CM | POA: Diagnosis not present

## 2014-08-17 ENCOUNTER — Encounter (HOSPITAL_COMMUNITY): Payer: Self-pay | Admitting: Emergency Medicine

## 2014-08-17 ENCOUNTER — Emergency Department (HOSPITAL_COMMUNITY)
Admission: EM | Admit: 2014-08-17 | Discharge: 2014-08-17 | Disposition: A | Discharge status: Home / Self Care | Payer: Medicare Other | Attending: Emergency Medicine | Admitting: Emergency Medicine

## 2014-08-17 DIAGNOSIS — R55 Syncope and collapse: Secondary | ICD-10-CM

## 2014-08-17 DIAGNOSIS — Z87891 Personal history of nicotine dependence: Secondary | ICD-10-CM | POA: Insufficient documentation

## 2014-08-17 DIAGNOSIS — I509 Heart failure, unspecified: Secondary | ICD-10-CM | POA: Diagnosis not present

## 2014-08-17 DIAGNOSIS — N289 Disorder of kidney and ureter, unspecified: Secondary | ICD-10-CM | POA: Diagnosis not present

## 2014-08-17 DIAGNOSIS — I252 Old myocardial infarction: Secondary | ICD-10-CM | POA: Insufficient documentation

## 2014-08-17 DIAGNOSIS — E119 Type 2 diabetes mellitus without complications: Secondary | ICD-10-CM | POA: Diagnosis not present

## 2014-08-17 DIAGNOSIS — Z8673 Personal history of transient ischemic attack (TIA), and cerebral infarction without residual deficits: Secondary | ICD-10-CM | POA: Insufficient documentation

## 2014-08-17 DIAGNOSIS — R110 Nausea: Secondary | ICD-10-CM | POA: Insufficient documentation

## 2014-08-17 DIAGNOSIS — R404 Transient alteration of awareness: Secondary | ICD-10-CM | POA: Diagnosis not present

## 2014-08-17 HISTORY — DX: Cerebral infarction, unspecified: I63.9

## 2014-08-17 HISTORY — DX: Heart failure, unspecified: I50.9

## 2014-08-17 LAB — CBC WITH DIFFERENTIAL/PLATELET
BASOS ABS: 0 10*3/uL (ref 0.0–0.1)
Basophils Relative: 0 % (ref 0–1)
EOS PCT: 1 % (ref 0–5)
Eosinophils Absolute: 0.1 10*3/uL (ref 0.0–0.7)
HCT: 39.7 % (ref 36.0–46.0)
Hemoglobin: 13.2 g/dL (ref 12.0–15.0)
LYMPHS PCT: 40 % (ref 12–46)
Lymphs Abs: 1.6 10*3/uL (ref 0.7–4.0)
MCH: 31.7 pg (ref 26.0–34.0)
MCHC: 33.2 g/dL (ref 30.0–36.0)
MCV: 95.2 fL (ref 78.0–100.0)
Monocytes Absolute: 0.2 10*3/uL (ref 0.1–1.0)
Monocytes Relative: 6 % (ref 3–12)
Neutro Abs: 2.1 10*3/uL (ref 1.7–7.7)
Neutrophils Relative %: 53 % (ref 43–77)
PLATELETS: 205 10*3/uL (ref 150–400)
RBC: 4.17 MIL/uL (ref 3.87–5.11)
RDW: 12.2 % (ref 11.5–15.5)
WBC: 4 10*3/uL (ref 4.0–10.5)

## 2014-08-17 LAB — COMPREHENSIVE METABOLIC PANEL
ALBUMIN: 4.1 g/dL (ref 3.5–5.2)
ALK PHOS: 57 U/L (ref 39–117)
ALT: 19 U/L (ref 0–35)
ANION GAP: 9 (ref 5–15)
AST: 18 U/L (ref 0–37)
BUN: 14 mg/dL (ref 6–23)
CALCIUM: 9.9 mg/dL (ref 8.4–10.5)
CO2: 27 mmol/L (ref 19–32)
CREATININE: 1.48 mg/dL — AB (ref 0.50–1.10)
Chloride: 104 mmol/L (ref 96–112)
GFR calc non Af Amer: 36 mL/min — ABNORMAL LOW (ref >90)
GFR, EST AFRICAN AMERICAN: 42 mL/min — AB (ref >90)
GLUCOSE: 81 mg/dL (ref 70–99)
Potassium: 3.8 mmol/L (ref 3.5–5.1)
Sodium: 140 mmol/L (ref 135–145)
TOTAL PROTEIN: 7.3 g/dL (ref 6.0–8.3)
Total Bilirubin: 0.7 mg/dL (ref 0.3–1.2)

## 2014-08-17 LAB — URINALYSIS, ROUTINE W REFLEX MICROSCOPIC
Bilirubin Urine: NEGATIVE
Glucose, UA: NEGATIVE mg/dL
HGB URINE DIPSTICK: NEGATIVE
Ketones, ur: NEGATIVE mg/dL
Leukocytes, UA: NEGATIVE
NITRITE: NEGATIVE
Protein, ur: NEGATIVE mg/dL
SPECIFIC GRAVITY, URINE: 1.007 (ref 1.005–1.030)
UROBILINOGEN UA: 0.2 mg/dL (ref 0.0–1.0)
pH: 7 (ref 5.0–8.0)

## 2014-08-17 LAB — TROPONIN I: Troponin I: <0.03 ng/mL (ref <0.031)

## 2014-08-17 MED ORDER — CLONIDINE HCL 0.2 MG PO TABS: 0.2000 mg | ORAL_TABLET | Freq: Two times a day (BID) | ORAL | Status: DC

## 2014-08-17 MED ORDER — ONDANSETRON HCL 4 MG/2ML IJ SOLN
4.0000 mg | Freq: Once | INTRAMUSCULAR | Status: AC
  Administered 2014-08-17: 4 mg via INTRAVENOUS
  Filled 2014-08-17: qty 2

## 2014-08-17 NOTE — ED Notes (Addendum)
Per EMS, pt had near syncopal episode. Pt had recent stroke in January. Pt noted to have difficult speaking and pt sts that is baseline. Pt c/o abdominal pain and right eye pressure after taking new medication hydrochlorothiazide . EMS vitals 148/68, 73HR, 98% Rm Air, CBG 72

## 2014-08-17 NOTE — ED Provider Notes (Signed)
CSN: [id number]     Arrival date & time 08/17/14  1710 History   First MD Initiated Contact with Patient 08/17/14 1743     Chief Complaint  Patient presents with  . Near Syncope     (Consider location/radiation/quality/duration/timing/severity/associated sxs/prior Treatment) Patient is a 64 y.o. female presenting with near-syncope. The history is provided by the patient.  Near Syncope  She relates that she was started on a blood pressure medicine after a stroke that she suffered 2 months ago. It is an orange pill which she takes 3 times a day and she frequently develops nausea after taking it. She will also have some episodes of lightheadedness. She had a similar episode today. She had to sit down but did not pass out. There is no associated chest pain, heaviness, tightness, pressure. There was no diaphoresis. Symptoms have resolved. She states that it is not consistent. There are times she takes the medication she does not get sick but she never had problems before being put on a medication and only occurs after taking it.  Past Medical History  Diagnosis Date  . Stroke   . CHF (congestive heart failure)   . Diabetes mellitus without complication   . MI (myocardial infarction)    History reviewed. No pertinent past surgical history. History reviewed. No pertinent family history. History  Substance Use Topics  . Smoking status: Former Smoker    Quit date: 03/30/2014  . Smokeless tobacco: Not on file  . Alcohol Use: No   OB History    No data available     Review of Systems  Cardiovascular: Positive for near-syncope.  All other systems reviewed and are negative.     Allergies  Review of patient's allergies indicates no known allergies.  Home Medications   Prior to Admission medications   Not on File   BP 177/75 mmHg  Pulse 60  Temp(Src) 98.3 F (36.8 C)  Resp 18  SpO2 99% Physical Exam  Nursing note and vitals reviewed.  64 year old female, resting  comfortably and in no acute distress. Vital signs are significant for hypertension. Oxygen saturation is 99%, which is normal. Head is normocephalic and atraumatic. PERRLA, EOMI. fundi show no hemorrhage, exudate, or papilledema. Oropharynx is clear. Neck is nontender and supple without adenopathy or JVD. There are no carotid bruits. Back is nontender and there is no CVA tenderness. Lungs are clear without rales, wheezes, or rhonchi. Chest is nontender. Heart has regular rate and rhythm without murmur. Abdomen is soft, flat, nontender without masses or hepatosplenomegaly and peristalsis is normoactive. Extremities have no cyanosis or edema, full range of motion is present. Skin is warm and dry without rash. Neurologic: Mental status is normal, cranial nerves are intact, there are no motor or sensory deficits.  ED Course  Procedures (including critical care time) Labs Review Results for orders placed or performed during the hospital encounter of 08/17/14  Comprehensive metabolic panel  Result Value Ref Range   Sodium 140 135 - 145 mmol/L   Potassium 3.8 3.5 - 5.1 mmol/L   Chloride 104 96 - 112 mmol/L   CO2 27 19 - 32 mmol/L   Glucose, Bld 81 70 - 99 mg/dL   BUN 14 6 - 23 mg/dL   Creatinine, Ser 1.48 (H) 0.50 - 1.10 mg/dL   Calcium 9.9 8.4 - 10.5 mg/dL   Total Protein 7.3 6.0 - 8.3 g/dL   Albumin 4.1 3.5 - 5.2 g/dL   AST 18 0 - 37 U/L  ALT 19 0 - 35 U/L   Alkaline Phosphatase 57 39 - 117 U/L   Total Bilirubin 0.7 0.3 - 1.2 mg/dL   GFR calc non Af Amer 36 (L) >90 mL/min   GFR calc Af Amer 42 (L) >90 mL/min   Anion gap 9 5 - 15  CBC with Differential  Result Value Ref Range   WBC 4.0 4.0 - 10.5 K/uL   RBC 4.17 3.87 - 5.11 MIL/uL   Hemoglobin 13.2 12.0 - 15.0 g/dL   HCT 39.7 36.0 - 46.0 %   MCV 95.2 78.0 - 100.0 fL   MCH 31.7 26.0 - 34.0 pg   MCHC 33.2 30.0 - 36.0 g/dL   RDW 12.2 11.5 - 15.5 %   Platelets 205 150 - 400 K/uL   Neutrophils Relative % 53 43 - 77 %   Neutro Abs  2.1 1.7 - 7.7 K/uL   Lymphocytes Relative 40 12 - 46 %   Lymphs Abs 1.6 0.7 - 4.0 K/uL   Monocytes Relative 6 3 - 12 %   Monocytes Absolute 0.2 0.1 - 1.0 K/uL   Eosinophils Relative 1 0 - 5 %   Eosinophils Absolute 0.1 0.0 - 0.7 K/uL   Basophils Relative 0 0 - 1 %   Basophils Absolute 0.0 0.0 - 0.1 K/uL  Urinalysis, Routine w reflex microscopic  Result Value Ref Range   Color, Urine YELLOW YELLOW   APPearance CLEAR CLEAR   Specific Gravity, Urine 1.007 1.005 - 1.030   pH 7.0 5.0 - 8.0   Glucose, UA NEGATIVE NEGATIVE mg/dL   Hgb urine dipstick NEGATIVE NEGATIVE   Bilirubin Urine NEGATIVE NEGATIVE   Ketones, ur NEGATIVE NEGATIVE mg/dL   Protein, ur NEGATIVE NEGATIVE mg/dL   Urobilinogen, UA 0.2 0.0 - 1.0 mg/dL   Nitrite NEGATIVE NEGATIVE   Leukocytes, UA NEGATIVE NEGATIVE  Troponin I  Result Value Ref Range   Troponin I <0.03 <0.031 ng/mL    EKG Interpretation   Date/Time:  Tuesday August 17 2014 17:15:23 EDT Ventricular Rate:  58 PR Interval:  255 QRS Duration: 91 QT Interval:  467 QTC Calculation: 459 R Axis:   52 Text Interpretation:  Sinus rhythm Prolonged PR interval Nonspecific T  abnormalities, lateral leads When compared with ECG of 09/22/2002, No  significant change was found Confirmed by [facility]  MD, Myisha Pickerel ([id number]) on  08/17/2014 7:08:09 PM      MDM   Final diagnoses:  Near syncope  Nausea  Renal insufficiency    Nausea and dizziness which appeared to be related to medication. I suspect that the medication that is causing her problems is hydralazine. Her records from [facility] are not viewable in [place]. Medication reconciliation is in process.  It does appear that the medication that she has been taking his hydralazine. I will discontinue hydralazine and replace it with clonidine. She is to follow-up with her PCP in one week to assess whether she needs to stand clonidine isn't be switched to a different medication.   Delora Fuel,  MD [id number]

## 2014-08-17 NOTE — Discharge Instructions (Signed)
Stop taking Hydralazine - you seem to be having a reaction to it. Start taking Clonidine - take it twice a day. See your doctor in one week to recheck your blood pressure and to decide if you should stay on the clonidine.  Clonidine tablets What is this medicine? CLONIDINE (KLOE ni deen) is used to treat high blood pressure. This medicine may be used for other purposes; ask your health care provider or pharmacist if you have questions. COMMON BRAND NAME(S): Catapres What should I tell my health care provider before I take this medicine? They need to know if you have any of these conditions: -kidney disease -an unusual or allergic reaction to clonidine, other medicines, foods, dyes, or preservatives -pregnant or trying to get pregnant -breast-feeding How should I use this medicine? Take this medicine by mouth with a glass of water. Follow the directions on the prescription label. Take your doses at regular intervals. Do not take your medicine more often than directed. Do not suddenly stop taking this medicine. You must gradually reduce the dose or you may get a dangerous increase in blood pressure. Ask your doctor or health care professional for advice. Talk to your pediatrician regarding the use of this medicine in children. Special care may be needed. Overdosage: If you think you have taken too much of this medicine contact a poison control center or emergency room at once. NOTE: This medicine is only for you. Do not share this medicine with others. What if I miss a dose? If you miss a dose, take it as soon as you can. If it is almost time for your next dose, take only that dose. Do not take double or extra doses. What may interact with this medicine? Do not take this medicine with any of the following medications: -MAOIs like Carbex, Eldepryl, Marplan, Nardil, and Parnate This medicine may also interact with the following medications: -barbiturate medicines for inducing sleep or treating  seizures like phenobarbital -certain medicines for blood pressure, heart disease, irregular heart beat -certain medicines for depression, anxiety, or psychotic disturbances -prescription pain medicines This list may not describe all possible interactions. Give your health care provider a list of all the medicines, herbs, non-prescription drugs, or dietary supplements you use. Also tell them if you smoke, drink alcohol, or use illegal drugs. Some items may interact with your medicine. What should I watch for while using this medicine? Visit your doctor or health care professional for regular checks on your progress. Check your heart rate and blood pressure regularly while you are taking this medicine. Ask your doctor or health care professional what your heart rate should be and when you should contact him or her. You may get drowsy or dizzy. Do not drive, use machinery, or do anything that needs mental alertness until you know how this medicine affects you. To avoid dizzy or fainting spells, do not stand or sit up quickly, especially if you are an older person. Alcohol can make you more drowsy and dizzy. Avoid alcoholic drinks. Your mouth may get dry. Chewing sugarless gum or sucking hard candy, and drinking plenty of water will help. Do not treat yourself for coughs, colds, or pain while you are taking this medicine without asking your doctor or health care professional for advice. Some ingredients may increase your blood pressure. If you are going to have surgery tell your doctor or health care professional that you are taking this medicine. What side effects may I notice from receiving this medicine? Side effects  that you should report to your doctor or health care professional as soon as possible: -allergic reactions like skin rash, itching or hives, swelling of the face, lips, or tongue -anxiety, nervousness -chest pain -depression -fast, irregular heartbeat -swelling of feet or  legs -unusually weak or tired Side effects that usually do not require medical attention (report to your doctor or health care professional if they continue or are bothersome): -change in sex drive or performance -constipation -headache This list may not describe all possible side effects. Call your doctor for medical advice about side effects. You may report side effects to FDA at 1-800-FDA-1088. Where should I keep my medicine? Keep out of the reach of children. Store at room temperature between 15 and 30 degrees C (59 and 86 degrees F). Protect from light. Keep container tightly closed. Throw away any unused medicine after the expiration date. NOTE: This sheet is a summary. It may not cover all possible information. If you have questions about this medicine, talk to your doctor, pharmacist, or health care provider.  2015, Elsevier/Gold Standard. (2010-10-11 13:01:28)

## 2014-08-18 ENCOUNTER — Emergency Department (HOSPITAL_COMMUNITY): Payer: Medicare Other

## 2014-08-18 ENCOUNTER — Emergency Department (HOSPITAL_COMMUNITY)
Admission: EM | Admit: 2014-08-18 | Discharge: 2014-08-18 | Disposition: A | Discharge status: Home / Self Care | Payer: Medicare Other | Attending: Emergency Medicine | Admitting: Emergency Medicine

## 2014-08-18 ENCOUNTER — Encounter (HOSPITAL_COMMUNITY): Payer: Self-pay | Admitting: Emergency Medicine

## 2014-08-18 DIAGNOSIS — R112 Nausea with vomiting, unspecified: Secondary | ICD-10-CM | POA: Diagnosis not present

## 2014-08-18 DIAGNOSIS — R7989 Other specified abnormal findings of blood chemistry: Secondary | ICD-10-CM | POA: Insufficient documentation

## 2014-08-18 DIAGNOSIS — Z79899 Other long term (current) drug therapy: Secondary | ICD-10-CM | POA: Diagnosis not present

## 2014-08-18 DIAGNOSIS — Z7951 Long term (current) use of inhaled steroids: Secondary | ICD-10-CM | POA: Diagnosis not present

## 2014-08-18 DIAGNOSIS — R55 Syncope and collapse: Secondary | ICD-10-CM | POA: Insufficient documentation

## 2014-08-18 DIAGNOSIS — I252 Old myocardial infarction: Secondary | ICD-10-CM | POA: Insufficient documentation

## 2014-08-18 DIAGNOSIS — Z8673 Personal history of transient ischemic attack (TIA), and cerebral infarction without residual deficits: Secondary | ICD-10-CM | POA: Diagnosis not present

## 2014-08-18 DIAGNOSIS — I509 Heart failure, unspecified: Secondary | ICD-10-CM | POA: Diagnosis not present

## 2014-08-18 DIAGNOSIS — Z87891 Personal history of nicotine dependence: Secondary | ICD-10-CM | POA: Insufficient documentation

## 2014-08-18 DIAGNOSIS — E119 Type 2 diabetes mellitus without complications: Secondary | ICD-10-CM | POA: Insufficient documentation

## 2014-08-18 DIAGNOSIS — R339 Retention of urine, unspecified: Secondary | ICD-10-CM | POA: Diagnosis not present

## 2014-08-18 DIAGNOSIS — K2970 Gastritis, unspecified, without bleeding: Secondary | ICD-10-CM | POA: Diagnosis not present

## 2014-08-18 DIAGNOSIS — R110 Nausea: Secondary | ICD-10-CM | POA: Diagnosis not present

## 2014-08-18 DIAGNOSIS — R1013 Epigastric pain: Secondary | ICD-10-CM | POA: Diagnosis not present

## 2014-08-18 DIAGNOSIS — R109 Unspecified abdominal pain: Secondary | ICD-10-CM | POA: Diagnosis present

## 2014-08-18 DIAGNOSIS — N3289 Other specified disorders of bladder: Secondary | ICD-10-CM | POA: Diagnosis not present

## 2014-08-18 DIAGNOSIS — J9811 Atelectasis: Secondary | ICD-10-CM | POA: Diagnosis not present

## 2014-08-18 LAB — CBC WITH DIFFERENTIAL/PLATELET
BASOS PCT: 1 % (ref 0–1)
Basophils Absolute: 0 10*3/uL (ref 0.0–0.1)
EOS ABS: 0 10*3/uL (ref 0.0–0.7)
Eosinophils Relative: 1 % (ref 0–5)
HCT: 40.7 % (ref 36.0–46.0)
HEMOGLOBIN: 13 g/dL (ref 12.0–15.0)
Lymphocytes Relative: 34 % (ref 12–46)
Lymphs Abs: 1 10*3/uL (ref 0.7–4.0)
MCH: 30.4 pg (ref 26.0–34.0)
MCHC: 31.9 g/dL (ref 30.0–36.0)
MCV: 95.1 fL (ref 78.0–100.0)
Monocytes Absolute: 0.2 10*3/uL (ref 0.1–1.0)
Monocytes Relative: 5 % (ref 3–12)
NEUTROS PCT: 59 % (ref 43–77)
Neutro Abs: 1.8 10*3/uL (ref 1.7–7.7)
PLATELETS: 190 10*3/uL (ref 150–400)
RBC: 4.28 MIL/uL (ref 3.87–5.11)
RDW: 12.3 % (ref 11.5–15.5)
WBC: 3 10*3/uL — ABNORMAL LOW (ref 4.0–10.5)

## 2014-08-18 LAB — URINALYSIS, ROUTINE W REFLEX MICROSCOPIC
BILIRUBIN URINE: NEGATIVE
Bilirubin Urine: NEGATIVE
GLUCOSE, UA: NEGATIVE mg/dL
Glucose, UA: NEGATIVE mg/dL
Hgb urine dipstick: NEGATIVE
Hgb urine dipstick: NEGATIVE
Ketones, ur: NEGATIVE mg/dL
Ketones, ur: NEGATIVE mg/dL
LEUKOCYTES UA: NEGATIVE
Leukocytes, UA: NEGATIVE
Nitrite: NEGATIVE
Nitrite: NEGATIVE
PH: 7 (ref 5.0–8.0)
PROTEIN: NEGATIVE mg/dL
Protein, ur: NEGATIVE mg/dL
Specific Gravity, Urine: 1.008 (ref 1.005–1.030)
Specific Gravity, Urine: 1.018 (ref 1.005–1.030)
UROBILINOGEN UA: 0.2 mg/dL (ref 0.0–1.0)
Urobilinogen, UA: 0.2 mg/dL (ref 0.0–1.0)
pH: 7.5 (ref 5.0–8.0)

## 2014-08-18 LAB — BASIC METABOLIC PANEL
Anion gap: 11 (ref 5–15)
BUN: 11 mg/dL (ref 6–23)
CO2: 22 mmol/L (ref 19–32)
Calcium: 9.6 mg/dL (ref 8.4–10.5)
Chloride: 106 mmol/L (ref 96–112)
Creatinine, Ser: 1.35 mg/dL — ABNORMAL HIGH (ref 0.50–1.10)
GFR calc Af Amer: 47 mL/min — ABNORMAL LOW (ref >90)
GFR calc non Af Amer: 40 mL/min — ABNORMAL LOW (ref >90)
GLUCOSE: 74 mg/dL (ref 70–99)
POTASSIUM: 3.9 mmol/L (ref 3.5–5.1)
Sodium: 139 mmol/L (ref 135–145)

## 2014-08-18 LAB — COMPREHENSIVE METABOLIC PANEL
ALK PHOS: 56 U/L (ref 39–117)
ALT: 26 U/L (ref 0–35)
AST: 50 U/L — AB (ref 0–37)
Albumin: 4.1 g/dL (ref 3.5–5.2)
Anion gap: 9 (ref 5–15)
BILIRUBIN TOTAL: 1.7 mg/dL — AB (ref 0.3–1.2)
BUN: 14 mg/dL (ref 6–23)
CALCIUM: 9.7 mg/dL (ref 8.4–10.5)
CO2: 26 mmol/L (ref 19–32)
Chloride: 105 mmol/L (ref 96–112)
Creatinine, Ser: 1.47 mg/dL — ABNORMAL HIGH (ref 0.50–1.10)
GFR, EST AFRICAN AMERICAN: 42 mL/min — AB (ref >90)
GFR, EST NON AFRICAN AMERICAN: 36 mL/min — AB (ref >90)
GLUCOSE: 96 mg/dL (ref 70–99)
POTASSIUM: 5.8 mmol/L — AB (ref 3.5–5.1)
Sodium: 140 mmol/L (ref 135–145)
Total Protein: 7.1 g/dL (ref 6.0–8.3)

## 2014-08-18 LAB — LIPASE, BLOOD: Lipase: 24 U/L (ref 11–59)

## 2014-08-18 MED ORDER — SODIUM CHLORIDE 0.9 % IV BOLUS (SEPSIS)
250.0000 mL | Freq: Once | INTRAVENOUS | Status: AC
  Administered 2014-08-18: 250 mL via INTRAVENOUS

## 2014-08-18 MED ORDER — IOHEXOL 300 MG/ML  SOLN
100.0000 mL | Freq: Once | INTRAMUSCULAR | Status: AC | PRN
  Administered 2014-08-18: 80 mL via INTRAVENOUS

## 2014-08-18 MED ORDER — SODIUM CHLORIDE 0.9 % IV SOLN
INTRAVENOUS | Status: DC
  Administered 2014-08-18: 13:00:00 via INTRAVENOUS

## 2014-08-18 MED ORDER — IOHEXOL 300 MG/ML  SOLN
25.0000 mL | Freq: Once | INTRAMUSCULAR | Status: AC | PRN
  Administered 2014-08-18: 25 mL via ORAL

## 2014-08-18 MED ORDER — ONDANSETRON HCL 4 MG/2ML IJ SOLN
4.0000 mg | Freq: Once | INTRAMUSCULAR | Status: AC
  Administered 2014-08-18: 4 mg via INTRAVENOUS
  Filled 2014-08-18: qty 2

## 2014-08-18 MED ORDER — HYDROMORPHONE HCL 1 MG/ML IJ SOLN
0.5000 mg | Freq: Once | INTRAMUSCULAR | Status: AC
  Administered 2014-08-18: 0.5 mg via INTRAVENOUS
  Filled 2014-08-18: qty 1

## 2014-08-18 NOTE — ED Notes (Signed)
Pt given a leg bag and a standard drainage bag for her catheter and instructed on use. Pt verbalized understanding.

## 2014-08-18 NOTE — ED Notes (Signed)
Pt up to restroom, 1-assist. Does well, good balance with steady gait

## 2014-08-18 NOTE — ED Notes (Signed)
Pt leaving for x ray at this time with [facility], transporter.

## 2014-08-18 NOTE — ED Notes (Signed)
Pt had 614 cc of urine in bladder

## 2014-08-18 NOTE — ED Provider Notes (Signed)
CSN: [id number]     Arrival date & time 08/18/14  1039 History   First MD Initiated Contact with Patient 08/18/14 1050     Chief Complaint  Patient presents with  . Near Syncope  . Abdominal Pain     (Consider location/radiation/quality/duration/timing/severity/associated sxs/prior Treatment) Patient is a 64 y.o. female presenting with near-syncope and abdominal pain. The history is provided by the patient.  Near Syncope Associated symptoms include abdominal pain. Pertinent negatives include no chest pain, no headaches and no shortness of breath.  Abdominal Pain Associated symptoms: nausea and vomiting   Associated symptoms: no chest pain, no diarrhea, no dysuria, no fever and no shortness of breath    patient seen in the emergency department on April 19 for the same complaint. Patient with persistent complaint of epigastric abdominal pain for 2-3 days. Associated with some nausea vomiting 1 day ago and none since. No diarrhea. No fever. Patient also with the feeling of near-syncope as if she's going to pass out but has not passed out. Denies chest pain shortness of breath fever headache.  Past Medical History  Diagnosis Date  . Stroke   . CHF (congestive heart failure)   . Diabetes mellitus without complication   . MI (myocardial infarction)    History reviewed. No pertinent past surgical history. History reviewed. No pertinent family history. History  Substance Use Topics  . Smoking status: Former Smoker    Quit date: 03/30/2014  . Smokeless tobacco: Not on file  . Alcohol Use: No   OB History    No data available     Review of Systems  Constitutional: Negative for fever.  HENT: Negative for congestion.   Respiratory: Negative for shortness of breath.   Cardiovascular: Positive for near-syncope. Negative for chest pain.  Gastrointestinal: Positive for nausea, vomiting and abdominal pain. Negative for diarrhea.  Genitourinary: Negative for dysuria.  Musculoskeletal:  Negative for back pain.  Skin: Negative for rash.  Neurological: Negative for headaches.  Hematological: Does not bruise/bleed easily.  Psychiatric/Behavioral: Negative for confusion.      Allergies  Review of patient's allergies indicates no known allergies.  Home Medications   Prior to Admission medications   Medication Sig Start Date End Date Taking? Authorizing Provider  amLODipine (NORVASC) 10 MG tablet Take 10 mg by mouth daily.   Yes Historical Provider, MD  aspirin EC 81 MG tablet Take 81 mg by mouth daily.   Yes Historical Provider, MD  fluticasone (FLONASE) 50 MCG/ACT nasal spray Place 2 sprays into both nostrils daily.   Yes Historical Provider, MD  hydrALAZINE (APRESOLINE) 50 MG tablet Take 50 mg by mouth 3 (three) times daily.   Yes Historical Provider, MD  isosorbide mononitrate (IMDUR) 30 MG 24 hr tablet Take 30 mg by mouth daily.   Yes Historical Provider, MD  loratadine (CLARITIN) 10 MG tablet Take 10 mg by mouth daily.   Yes Historical Provider, MD  lovastatin (MEVACOR) 40 MG tablet Take 40 mg by mouth at bedtime.   Yes Historical Provider, MD  metoprolol succinate (TOPROL-XL) 25 MG 24 hr tablet Take 25 mg by mouth daily.   Yes Historical Provider, MD  ondansetron (ZOFRAN) 4 MG tablet Take 4 mg by mouth every 8 (eight) hours as needed for nausea or vomiting.   Yes Historical Provider, MD  cloNIDine (CATAPRES) 0.2 MG tablet Take 1 tablet (0.2 mg total) by mouth 2 (two) times daily. Patient not taking: Reported on 08/18/2014 [id number]   Delora Fuel, MD  BP 171/75 mmHg  Pulse 48  Resp 13  SpO2 96% Physical Exam  Constitutional: She is oriented to person, place, and time. She appears well-developed and well-nourished. No distress.  HENT:  Head: Normocephalic and atraumatic.  Mouth/Throat: Oropharynx is clear and moist.  Eyes: Conjunctivae and EOM are normal. Pupils are equal, round, and reactive to light.  Neck: Normal range of motion. Neck supple.  Cardiovascular:  Normal rate, regular rhythm and normal heart sounds.   Pulmonary/Chest: Effort normal and breath sounds normal. No respiratory distress.  Abdominal: Soft. Bowel sounds are normal. She exhibits no distension. There is tenderness. There is no guarding.  Musculoskeletal: Normal range of motion.  Neurological: She is alert and oriented to person, place, and time. No cranial nerve deficit. She exhibits normal muscle tone. Coordination normal.  Skin: Skin is warm. No rash noted.  Nursing note and vitals reviewed.   ED Course  Procedures (including critical care time) Labs Review Labs Reviewed  COMPREHENSIVE METABOLIC PANEL - Abnormal; Notable for the following:    Potassium 5.8 (*)    Creatinine, Ser 1.47 (*)    AST 50 (*)    Total Bilirubin 1.7 (*)    GFR calc non Af Amer 36 (*)    GFR calc Af Amer 42 (*)    All other components within normal limits  CBC WITH DIFFERENTIAL/PLATELET - Abnormal; Notable for the following:    WBC 3.0 (*)    All other components within normal limits  LIPASE, BLOOD  BASIC METABOLIC PANEL   Results for orders placed or performed during the hospital encounter of 08/18/14  Comprehensive metabolic panel  Result Value Ref Range   Sodium 140 135 - 145 mmol/L   Potassium 5.8 (H) 3.5 - 5.1 mmol/L   Chloride 105 96 - 112 mmol/L   CO2 26 19 - 32 mmol/L   Glucose, Bld 96 70 - 99 mg/dL   BUN 14 6 - 23 mg/dL   Creatinine, Ser 1.47 (H) 0.50 - 1.10 mg/dL   Calcium 9.7 8.4 - 10.5 mg/dL   Total Protein 7.1 6.0 - 8.3 g/dL   Albumin 4.1 3.5 - 5.2 g/dL   AST 50 (H) 0 - 37 U/L   ALT 26 0 - 35 U/L   Alkaline Phosphatase 56 39 - 117 U/L   Total Bilirubin 1.7 (H) 0.3 - 1.2 mg/dL   GFR calc non Af Amer 36 (L) >90 mL/min   GFR calc Af Amer 42 (L) >90 mL/min   Anion gap 9 5 - 15  Lipase, blood  Result Value Ref Range   Lipase 24 11 - 59 U/L  CBC with Differential/Platelet  Result Value Ref Range   WBC 3.0 (L) 4.0 - 10.5 K/uL   RBC 4.28 3.87 - 5.11 MIL/uL   Hemoglobin  13.0 12.0 - 15.0 g/dL   HCT 40.7 36.0 - 46.0 %   MCV 95.1 78.0 - 100.0 fL   MCH 30.4 26.0 - 34.0 pg   MCHC 31.9 30.0 - 36.0 g/dL   RDW 12.3 11.5 - 15.5 %   Platelets 190 150 - 400 K/uL   Neutrophils Relative % 59 43 - 77 %   Neutro Abs 1.8 1.7 - 7.7 K/uL   Lymphocytes Relative 34 12 - 46 %   Lymphs Abs 1.0 0.7 - 4.0 K/uL   Monocytes Relative 5 3 - 12 %   Monocytes Absolute 0.2 0.1 - 1.0 K/uL   Eosinophils Relative 1 0 - 5 %  Eosinophils Absolute 0.0 0.0 - 0.7 K/uL   Basophils Relative 1 0 - 1 %   Basophils Absolute 0.0 0.0 - 0.1 K/uL    Imaging Review Dg Chest 2 View  08/18/2014   CLINICAL DATA:  Epigastric pain.  Nausea and vomiting  EXAM: CHEST  2 VIEW  COMPARISON:  None.  FINDINGS: Mild bibasilar airspace disease most consistent with atelectasis. No definite pneumonia or effusion. Negative for heart failure.  IMPRESSION: Mild bibasilar atelectasis.   Electronically Signed   By: Franchot Gallo M.D.   On: 08/18/2014 12:33   Ct Abdomen Pelvis W Contrast  08/18/2014   CLINICAL DATA:  64 year old female with epigastric abdominal pain for 3 days with nausea and vomiting. Initial encounter.  EXAM: CT ABDOMEN AND PELVIS WITH CONTRAST  TECHNIQUE: Multidetector CT imaging of the abdomen and pelvis was performed using the standard protocol following bolus administration of intravenous contrast.  CONTRAST:  77mL OMNIPAQUE IOHEXOL 300 MG/ML  SOLN  COMPARISON:  CT Abdomen and Pelvis 04/11/2014.  FINDINGS: Cardiomegaly. No pericardial or pleural effusion. Improved ventilation at the lung bases with less ground-glass and mosaic attenuation. Mild dependent atelectasis now.  Degenerative changes in the spine. No acute osseous abnormality identified.  No pelvic free fluid.  Negative rectum.  Negative uterus and adnexa.  Severe bladder distension. Estimated bladder volume 588 mL. No bladder wall thickening or perivesical stranding.  Negative sigmoid colon. Decompressed and negative left colon. Negative  transverse colon with gas and stool. Redundant hepatic flexure. Stool and oral contrast in the right colon. Normal appendix. Negative terminal ileum. No dilated small bowel. Decompressed stomach and duodenum.  Liver, gallbladder, spleen, pancreas and adrenal glands are within normal limits. The portal venous system is patent. Aortoiliac calcified atherosclerosis noted. Major arterial structures are patent. No abdominal free fluid.  Chronic hydronephrosis and extra renal pelvis. Symmetric renal contrast enhancement and excretion. Bilateral hydroureter without periureteral stranding or urologic calculus. No lymphadenopathy.  IMPRESSION: 1. Severe bladder distension (estimated bladder volume 588 mL) is felt responsible for bilateral hydroureter and hydronephrosis. Query bladder outlet obstruction. 2. No other acute or inflammatory process in the abdomen or pelvis. Normal appendix.   Electronically Signed   By: Genevie Ann M.D.   On: 08/18/2014 16:00     EKG Interpretation   Date/Time:  Wednesday August 18 2014 10:54:12 EDT Ventricular Rate:  52 PR Interval:  256 QRS Duration: 99 QT Interval:  466 QTC Calculation: 433 R Axis:   65 Text Interpretation:  Sinus rhythm Prolonged PR interval Borderline T  abnormalities, diffuse leads No significant change since last tracing  Confirmed by Sharlize Hoar  MD, Salif Tay [phone]) on 08/18/2014 4:11:56 PM      MDM   Final diagnoses:  Epigastric pain  Epigastric pain    Patient returned today with same complaint for which she was seen on the 19th. No new symptoms. Patient still feeling like she's going to pass out. Patient had normal labs with the exception of a slight elevation in the creatinine from the 19th. Patient did not had CT of the abdomen. Patient's reporting near syncopal feeling also associated with epigastric abdominal pain. States that these been present for 2-3 days. Associated with nausea just vomited just wants none lately and no diarrhea no  fevers.  Workup revealed elevated potassium wonder whether that's may be hemolysis causing creatinine is unchanged from before. EKG without acute changes of hyperkalemia. Repeat potassium ordered. CT raise concerns for overdistended bladder. Patient P following the CT scan. We'll  do a bladder scan to make sure she's emptying her bladder reasonably.  And to make sure that there is not an obstructive type picture. From a bowel standpoint no significant findings. Labs without any significant changes compared to the other day.  Patient's primary care doctor is in the [place] area.    Fredia Sorrow, MD 08/18/14 [phone]

## 2014-08-18 NOTE — ED Notes (Signed)
Patient repositioned after foley insertion

## 2014-08-18 NOTE — ED Notes (Signed)
Patient cleaned as per protocol, foley inserted.  2nd RN Robbi Garter, RN present

## 2014-08-18 NOTE — ED Provider Notes (Signed)
Care assumed from Dr. Rogene Houston. 65 yo female with epigastric pain.  CT showed distended bladder and metabolic panel showed mild hyperkalemia (question hemolysis).  Plan at time of transfer of care was to check bladder scan and recheck BMP.   Repeat BMP with normal K.  I suspect initial K was lab error.    Bladder scan positive for retention, foley placed with initial output of about 600 cc's.  On re-eval, pt stated that she is in the process of being evaluated for urinary retention.  She says she has a follow up appointment with Urology early next week.  In light of this, I think it is reasonable to discharge since she has close follow up.  She will also follow up with her PCP tomorrow.    Clinical Impression: 1. Urinary retention   2. Epigastric pain       Serita Grit, MD 08/18/14 [phone]

## 2014-08-18 NOTE — ED Notes (Signed)
Notified CT pt is finished with contrast. According to CT, pt is in line for procedure.

## 2014-08-18 NOTE — Discharge Instructions (Signed)
Abdominal Pain, Women Abdominal (stomach, pelvic, or belly) pain can be caused by many things. It is important to tell your doctor:  The location of the pain.  Does it come and go or is it present all the time?  Are there things that start the pain (eating certain foods, exercise)?  Are there other symptoms associated with the pain (fever, nausea, vomiting, diarrhea)? All of this is helpful to know when trying to find the cause of the pain. CAUSES   Stomach: virus or bacteria infection, or ulcer.  Intestine: appendicitis (inflamed appendix), regional ileitis (Crohn's disease), ulcerative colitis (inflamed colon), irritable bowel syndrome, diverticulitis (inflamed diverticulum of the colon), or cancer of the stomach or intestine.  Gallbladder disease or stones in the gallbladder.  Kidney disease, kidney stones, or infection.  Pancreas infection or cancer.  Fibromyalgia (pain disorder).  Diseases of the female organs:  Uterus: fibroid (non-cancerous) tumors or infection.  Fallopian tubes: infection or tubal pregnancy.  Ovary: cysts or tumors.  Pelvic adhesions (scar tissue).  Endometriosis (uterus lining tissue growing in the pelvis and on the pelvic organs).  Pelvic congestion syndrome (female organs filling up with blood just before the menstrual period).  Pain with the menstrual period.  Pain with ovulation (producing an egg).  Pain with an IUD (intrauterine device, birth control) in the uterus.  Cancer of the female organs.  Functional pain (pain not caused by a disease, may improve without treatment).  Psychological pain.  Depression. DIAGNOSIS  Your doctor will decide the seriousness of your pain by doing an examination.  Blood tests.  X-rays.  Ultrasound.  CT scan (computed tomography, special type of X-ray).  MRI (magnetic resonance imaging).  Cultures, for infection.  Barium enema (dye inserted in the large intestine, to better view it with  X-rays).  Colonoscopy (looking in intestine with a lighted tube).  Laparoscopy (minor surgery, looking in abdomen with a lighted tube).  Major abdominal exploratory surgery (looking in abdomen with a large incision). TREATMENT  The treatment will depend on the cause of the pain.   Many cases can be observed and treated at home.  Over-the-counter medicines recommended by your caregiver.  Prescription medicine.  Antibiotics, for infection.  Birth control pills, for painful periods or for ovulation pain.  Hormone treatment, for endometriosis.  Nerve blocking injections.  Physical therapy.  Antidepressants.  Counseling with a psychologist or psychiatrist.  Minor or major surgery. HOME CARE INSTRUCTIONS   Do not take laxatives, unless directed by your caregiver.  Take over-the-counter pain medicine only if ordered by your caregiver. Do not take aspirin because it can cause an upset stomach or bleeding.  Try a clear liquid diet (broth or water) as ordered by your caregiver. Slowly move to a bland diet, as tolerated, if the pain is related to the stomach or intestine.  Have a thermometer and take your temperature several times a day, and record it.  Bed rest and sleep, if it helps the pain.  Avoid sexual intercourse, if it causes pain.  Avoid stressful situations.  Keep your follow-up appointments and tests, as your caregiver orders.  If the pain does not go away with medicine or surgery, you may try:  Acupuncture.  Relaxation exercises (yoga, meditation).  Group therapy.  Counseling. SEEK MEDICAL CARE IF:   You notice certain foods cause stomach pain.  Your home care treatment is not helping your pain.  You need stronger pain medicine.  You want your IUD removed.  You feel faint or  lightheaded.  You develop nausea and vomiting.  You develop a rash.  You are having side effects or an allergy to your medicine. SEEK IMMEDIATE MEDICAL CARE IF:   Your  pain does not go away or gets worse.  You have a fever.  Your pain is felt only in portions of the abdomen. The right side could possibly be appendicitis. The left lower portion of the abdomen could be colitis or diverticulitis.  You are passing blood in your stools (bright red or black tarry stools, with or without vomiting).  You have blood in your urine.  You develop chills, with or without a fever.  You pass out. MAKE SURE YOU:   Understand these instructions.  Will watch your condition.  Will get help right away if you are not doing well or get worse. Document Released: 02/11/2007 Document Revised: 08/31/2013 Document Reviewed: 03/03/2009 [facility] Patient Information 2015 [place], [place]. This information is not intended to replace advice given to you by your health care provider. Make sure you discuss any questions you have with your health care provider.  Acute Urinary Retention Urinary retention means you are unable to pee completely or at all (empty your bladder). HOME CARE  Drink enough fluids to keep your pee (urine) clear or pale yellow.  If you are sent home with a tube that drains the bladder (catheter), there will be a drainage bag attached to it. There are two types of bags. One is big that you can wear at night without having to empty it. One is smaller and needs to be emptied more often.  Keep the drainage bag emptied.  Keep the drainage bag lower than the tube.  Only take medicine as told by your doctor. GET HELP IF:  You have a low-grade fever.  You have spasms or you are leaking pee when you have spasms. GET HELP RIGHT AWAY IF:   You have chills or a fever.  Your catheter stops draining pee.  Your catheter falls out.  You have increased bleeding that does not stop after you have rested and increased the amount of fluids you had been drinking. MAKE SURE YOU:   Understand these instructions.  Will watch your condition.  Will get help right  away if you are not doing well or get worse. Document Released: 10/03/2007 Document Revised: 04/21/2013 Document Reviewed: 09/25/2012 [facility] Patient Information 2015 [place], [place]. This information is not intended to replace advice given to you by your health care provider. Make sure you discuss any questions you have with your health care provider.

## 2014-08-18 NOTE — ED Notes (Signed)
Notified radiology that pt is ready for scans.

## 2014-08-18 NOTE — ED Notes (Signed)
Pt back from x-ray.

## 2014-08-18 NOTE — ED Notes (Signed)
Per EMS- pt from home to be evaluated for epigastric abdominal pain that has been going on for 2-3 days associated with nausea and vomiting. Pt was seen at [facility] ED last night for the same. Pt given 4 mg of Zofran en route by EMS. Pt has hx of stroke in January and has mild aphasia from it as a deficit. Pt also states she "was going down, almost passed out."; per EMS, syncopal episode was not witnessed. Pt is alert and oriented x4. VSS at this time.

## 2014-08-19 ENCOUNTER — Observation Stay (HOSPITAL_COMMUNITY)
Admission: EM | Admit: 2014-08-19 | Discharge: 2014-08-22 | Disposition: A | Discharge status: Home / Self Care | Payer: Medicare Other | Attending: Internal Medicine | Admitting: Internal Medicine

## 2014-08-19 ENCOUNTER — Encounter (HOSPITAL_COMMUNITY): Payer: Self-pay | Admitting: Emergency Medicine

## 2014-08-19 ENCOUNTER — Emergency Department
Admit: 2014-08-19 | Disposition: A | Discharge status: Home / Self Care | Payer: Self-pay | Admitting: Emergency Medicine

## 2014-08-19 DIAGNOSIS — R339 Retention of urine, unspecified: Secondary | ICD-10-CM

## 2014-08-19 DIAGNOSIS — R42 Dizziness and giddiness: Principal | ICD-10-CM | POA: Insufficient documentation

## 2014-08-19 DIAGNOSIS — R55 Syncope and collapse: Secondary | ICD-10-CM | POA: Insufficient documentation

## 2014-08-19 DIAGNOSIS — R064 Hyperventilation: Secondary | ICD-10-CM | POA: Diagnosis not present

## 2014-08-19 DIAGNOSIS — R51 Headache: Secondary | ICD-10-CM | POA: Diagnosis not present

## 2014-08-19 DIAGNOSIS — I129 Hypertensive chronic kidney disease with stage 1 through stage 4 chronic kidney disease, or unspecified chronic kidney disease: Secondary | ICD-10-CM | POA: Insufficient documentation

## 2014-08-19 DIAGNOSIS — H539 Unspecified visual disturbance: Secondary | ICD-10-CM | POA: Diagnosis not present

## 2014-08-19 DIAGNOSIS — I252 Old myocardial infarction: Secondary | ICD-10-CM | POA: Insufficient documentation

## 2014-08-19 DIAGNOSIS — I69351 Hemiplegia and hemiparesis following cerebral infarction affecting right dominant side: Secondary | ICD-10-CM

## 2014-08-19 DIAGNOSIS — Z79899 Other long term (current) drug therapy: Secondary | ICD-10-CM | POA: Diagnosis not present

## 2014-08-19 DIAGNOSIS — I509 Heart failure, unspecified: Secondary | ICD-10-CM | POA: Diagnosis not present

## 2014-08-19 DIAGNOSIS — I10 Essential (primary) hypertension: Secondary | ICD-10-CM | POA: Diagnosis not present

## 2014-08-19 DIAGNOSIS — Z8673 Personal history of transient ischemic attack (TIA), and cerebral infarction without residual deficits: Secondary | ICD-10-CM

## 2014-08-19 DIAGNOSIS — Z87891 Personal history of nicotine dependence: Secondary | ICD-10-CM | POA: Insufficient documentation

## 2014-08-19 DIAGNOSIS — E785 Hyperlipidemia, unspecified: Secondary | ICD-10-CM | POA: Insufficient documentation

## 2014-08-19 DIAGNOSIS — R519 Headache, unspecified: Secondary | ICD-10-CM

## 2014-08-19 DIAGNOSIS — E119 Type 2 diabetes mellitus without complications: Secondary | ICD-10-CM | POA: Insufficient documentation

## 2014-08-19 DIAGNOSIS — R262 Difficulty in walking, not elsewhere classified: Secondary | ICD-10-CM | POA: Diagnosis not present

## 2014-08-19 DIAGNOSIS — I69398 Other sequelae of cerebral infarction: Secondary | ICD-10-CM | POA: Insufficient documentation

## 2014-08-19 DIAGNOSIS — I69328 Other speech and language deficits following cerebral infarction: Secondary | ICD-10-CM | POA: Diagnosis not present

## 2014-08-19 DIAGNOSIS — N184 Chronic kidney disease, stage 4 (severe): Secondary | ICD-10-CM | POA: Diagnosis present

## 2014-08-19 DIAGNOSIS — R531 Weakness: Secondary | ICD-10-CM | POA: Diagnosis not present

## 2014-08-19 DIAGNOSIS — Z7982 Long term (current) use of aspirin: Secondary | ICD-10-CM | POA: Insufficient documentation

## 2014-08-19 DIAGNOSIS — R110 Nausea: Secondary | ICD-10-CM | POA: Diagnosis not present

## 2014-08-19 DIAGNOSIS — N183 Chronic kidney disease, stage 3 unspecified: Secondary | ICD-10-CM | POA: Diagnosis present

## 2014-08-19 DIAGNOSIS — I639 Cerebral infarction, unspecified: Secondary | ICD-10-CM | POA: Diagnosis not present

## 2014-08-19 DIAGNOSIS — R404 Transient alteration of awareness: Secondary | ICD-10-CM | POA: Diagnosis not present

## 2014-08-19 LAB — CBC WITH DIFFERENTIAL/PLATELET
BASOS ABS: 0 10*3/uL (ref 0.0–0.1)
BASOS ABS: 0 10*3/uL (ref 0.0–0.1)
BASOS PCT: 0.5 %
Basophils Relative: 0 % (ref 0–1)
EOS PCT: 1 % (ref 0–5)
Eosinophil #: 0.1 10*3/uL (ref 0.0–0.7)
Eosinophil %: 1.7 %
Eosinophils Absolute: 0 10*3/uL (ref 0.0–0.7)
HCT: 44.8 % (ref 35.0–47.0)
HCT: 42.8 % (ref 36.0–46.0)
HGB: 14.7 g/dL (ref 12.0–16.0)
Hemoglobin: 14.3 g/dL (ref 12.0–15.0)
LYMPHS ABS: 0.8 10*3/uL — AB (ref 1.0–3.6)
LYMPHS PCT: 22 % (ref 12–46)
Lymphocyte %: 20.6 %
Lymphs Abs: 1.1 10*3/uL (ref 0.7–4.0)
MCH: 31.3 pg (ref 26.0–34.0)
MCH: 32.1 pg (ref 26.0–34.0)
MCHC: 32.8 g/dL (ref 32.0–36.0)
MCHC: 33.4 g/dL (ref 30.0–36.0)
MCV: 96 fL (ref 80–100)
MCV: 96 fL (ref 78.0–100.0)
Monocyte #: 0.3 x10 3/mm (ref 0.2–0.9)
Monocyte %: 7.5 %
Monocytes Absolute: 0.4 10*3/uL (ref 0.1–1.0)
Monocytes Relative: 7 % (ref 3–12)
NEUTROS ABS: 2.8 10*3/uL (ref 1.4–6.5)
NEUTROS PCT: 70 % (ref 43–77)
Neutro Abs: 3.5 10*3/uL (ref 1.7–7.7)
Neutrophil %: 69.7 %
PLATELETS: 197 10*3/uL (ref 150–400)
Platelet: 186 10*3/uL (ref 150–440)
RBC: 4.69 10*6/uL (ref 3.80–5.20)
RBC: 4.46 MIL/uL (ref 3.87–5.11)
RDW: 12.9 % (ref 11.5–14.5)
RDW: 12.1 % (ref 11.5–15.5)
WBC: 4 10*3/uL (ref 3.6–11.0)
WBC: 5 10*3/uL (ref 4.0–10.5)

## 2014-08-19 LAB — URINALYSIS, COMPLETE
BILIRUBIN, UR: NEGATIVE
Bacteria: NONE SEEN
GLUCOSE, UR: NEGATIVE mg/dL (ref 0–75)
Ketone: NEGATIVE
Nitrite: NEGATIVE
Ph: 5 (ref 4.5–8.0)
Protein: 30
SPECIFIC GRAVITY: 1.017 (ref 1.003–1.030)

## 2014-08-19 LAB — COMPREHENSIVE METABOLIC PANEL
ALK PHOS: 59 U/L (ref 39–117)
ALT: 17 U/L (ref 0–35)
AST: 21 U/L (ref 0–37)
Albumin: 4.3 g/dL (ref 3.5–5.2)
Anion gap: 8 (ref 5–15)
BILIRUBIN TOTAL: 0.8 mg/dL (ref 0.3–1.2)
BUN: 14 mg/dL (ref 6–23)
CHLORIDE: 108 mmol/L (ref 96–112)
CO2: 23 mmol/L (ref 19–32)
Calcium: 10.2 mg/dL (ref 8.4–10.5)
Creatinine, Ser: 1.28 mg/dL — ABNORMAL HIGH (ref 0.50–1.10)
GFR, EST AFRICAN AMERICAN: 50 mL/min — AB (ref >90)
GFR, EST NON AFRICAN AMERICAN: 43 mL/min — AB (ref >90)
GLUCOSE: 90 mg/dL (ref 70–99)
POTASSIUM: 3.7 mmol/L (ref 3.5–5.1)
Sodium: 139 mmol/L (ref 135–145)
Total Protein: 7.9 g/dL (ref 6.0–8.3)

## 2014-08-19 LAB — BASIC METABOLIC PANEL
ANION GAP: 7 (ref 7–16)
BUN: 16 mg/dL
CALCIUM: 10.2 mg/dL
CHLORIDE: 109 mmol/L
Co2: 26 mmol/L
Creatinine: 1.39 mg/dL — ABNORMAL HIGH
EGFR (Non-African Amer.): 40 — ABNORMAL LOW
GFR CALC AF AMER: 46 — AB
Glucose: 110 mg/dL — ABNORMAL HIGH
POTASSIUM: 4.1 mmol/L
Sodium: 142 mmol/L

## 2014-08-19 LAB — TROPONIN I

## 2014-08-19 NOTE — Progress Notes (Signed)
EDCM spoke to patient at bedside.  Patient confirms she lives at home with her mother and her son.  Patient confirms her pcp is Dr. Jiles Crocker.  Patient confirms she saw Dr. Jiles Crocker last week and has a follow up appointment scheduled for next Tuesday. Patient reports her urologist is Dr. [name] in [place].  Patient reports she has seen her urologist 3 weeks ago and thinks she has an upcoming appointment on May 10th or 11th.  Patient confirms she has a history of CHF, but she does not have a cardiologist per patient.  Patient reports her pcp manages her blood pressure.  Patient thinks she has a neurologist but cannot remember his name at this time.  Patient confirms she has had a stroke in Jan 2016.  After stroke patient now has right leg weakness, damaged eye sight and speech is slightly slurred per patient.  Patient reports she has been on hydralizine since her stroke.  Patient reports "They gave me two today at the hospital."  Patient was at [facility] yesterday, where a foley catheter was placed for urinary retention.  Patient reports she is comfortable with foley care.  Patient was also seen at [facility] today for similar symptoms.  Patient reports when she stands up, "I get dizzy and my blood pressure goes up."  [facility] asked patient how does she know her blood pressure goes up?  Patient replied, "Because they took it.  It goes up to 200 over something."  Patient reports her blood pressure was taken at the hospital.  Patient went on to say, her "head gets full and feels tight, I feel like I'm going to pass out."  Patient also reports she has not been eating or drinking anything much lately. Patient reports she usually uses a cane to walk and is able to complete her ADL's on her own.  No further EDCM needs at this time.

## 2014-08-19 NOTE — H&P (Signed)
[facility] History and Physical  Leslie Houston [id number] DOB: 22-Oct-1950 DOA: 08/19/2014  Referring physician: Dr. Ralene Bathe ER physician. PCP: [name],[name] Dr. Bobetta Lime at [facility].  Chief Complaint: Dizziness.  HPI: Leslie Houston is a 64 y.o. female with history of stroke in February 2016 which left patient with right-sided visual defect and difficulty speaking, hypertension, hyperlipidemia, previous history of cigarette smoking presents to the ER with persistent dizziness. Patient states he has been dizzy whenever she tries to walk and patient is not sure if she had loss consciousness. Patient's symptoms have been going on for last 3 weeks and had 3 ER visits in the last 3 days. CT head done during her previous visit was negative for anything acute. In the ER patient feels dizzy on ambulating. Patient's EKG shows sinus bradycardia rate around 56 bpm but patient's heart rate improves on walking. Patient on exam has decreased vision on the right eye and has chronic headache on the right side from her previous stroke patient states. Patient is able to move all extremities. Denies any chest pain or shortness of breath or palpitations nausea vomiting diarrhea or any new medications recently taken. Since patient is getting dizzy on trying to walk patient will be admitted for further management including getting MRI of the brain and physical therapy.   Review of Systems: As presented in the history of presenting illness, rest negative.  Past Medical History  Diagnosis Date  . Stroke   . CHF (congestive heart failure)   . Diabetes mellitus without complication   . MI (myocardial infarction)    Past Surgical History  Procedure Laterality Date  . Cesarean section     Social History:  reports that she quit smoking about 4 months ago. She does not have any smokeless tobacco history on file. She reports that she does not drink alcohol or use illicit  drugs. Where does patient live home. Can patient participate in ADLs? Yes.  No Known Allergies  Family History:  Family History  Problem Relation Age of Onset  . Stroke Mother   . Stroke Father   . Prostate cancer Father   . Diabetes Mellitus II Daughter       Prior to Admission medications   Medication Sig Start Date End Date Taking? Authorizing Provider  amLODipine (NORVASC) 10 MG tablet Take 10 mg by mouth daily.   Yes Historical Provider, MD  aspirin EC 81 MG tablet Take 81 mg by mouth daily.   Yes Historical Provider, MD  cloNIDine (CATAPRES) 0.2 MG tablet Take 1 tablet (0.2 mg total) by mouth 2 (two) times daily. Patient taking differently: Take 0.2 mg by mouth 3 (three) times daily.  [id number]  Yes Delora Fuel, MD  fluticasone [facility]) 50 MCG/ACT nasal spray Place 2 sprays into both nostrils daily as needed for allergies (allergies).    Yes Historical Provider, MD  hydrALAZINE (APRESOLINE) 50 MG tablet Take 50 mg by mouth 3 (three) times daily.   Yes Historical Provider, MD  isosorbide mononitrate (IMDUR) 30 MG 24 hr tablet Take 30 mg by mouth daily.   Yes Historical Provider, MD  loratadine (CLARITIN) 10 MG tablet Take 10 mg by mouth daily.   Yes Historical Provider, MD  lovastatin (MEVACOR) 40 MG tablet Take 40 mg by mouth at bedtime.   Yes Historical Provider, MD  metoprolol succinate (TOPROL-XL) 25 MG 24 hr tablet Take 25 mg by mouth at bedtime.    Yes Historical Provider, MD  ondansetron (ZOFRAN) 4 MG tablet  Take 4 mg by mouth every 8 (eight) hours as needed for nausea or vomiting (nausea).    Yes Historical Provider, MD    Physical Exam: Filed Vitals:   08/19/14 2200 08/19/14 2230 08/19/14 2248 08/19/14 2300  BP: 149/64 158/56  146/63  Pulse: 59 53  52  Temp:   97.6 F (36.4 C)   TempSrc:      Resp: 15 15  19   Height:      Weight:      SpO2: 94% 95%  95%     General:  Moderately built and nourished.  Eyes: Anicteric no pallor. Poor vision on the right  eye.  ENT: No discharge from the ears eyes nose and mouth.  Neck: No mass felt. No JVD appreciated.  Cardiovascular: S1 and S2 heard.  Respiratory: No rhonchi or crepitations.  Abdomen: Soft nontender bowel sounds present.  Skin: No rash.  Musculoskeletal: No edema.  Psychiatric: Appears normal.  Neurologic: Alert awake oriented to time place and person. Moves all extremities. No facial asymmetry. Poor vision of the right eye. PERRLA positive. No tongue deviation.  Labs on Admission:  Basic Metabolic Panel:  Recent Labs Lab 08/17/14 1846 08/18/14 1150 08/18/14 1823 08/19/14 1822  NA 140 140 139 139  K 3.8 5.8* 3.9 3.7  CL 104 105 106 108  CO2 27 26 22 23   GLUCOSE 81 96 74 90  BUN 14 14 11 14   CREATININE 1.48* 1.47* 1.35* 1.28*  CALCIUM 9.9 9.7 9.6 10.2   Liver Function Tests:  Recent Labs Lab 08/17/14 1846 08/18/14 1150 08/19/14 1822  AST 18 50* 21  ALT 19 26 17   ALKPHOS 57 56 59  BILITOT 0.7 1.7* 0.8  PROT 7.3 7.1 7.9  ALBUMIN 4.1 4.1 4.3    Recent Labs Lab 08/18/14 1150  LIPASE 24   No results for input(s): AMMONIA in the last 168 hours. CBC:  Recent Labs Lab 08/17/14 1846 08/18/14 1150 08/19/14 1822  WBC 4.0 3.0* 5.0  NEUTROABS 2.1 1.8 3.5  HGB 13.2 13.0 14.3  HCT 39.7 40.7 42.8  MCV 95.2 95.1 96.0  PLT 205 190 197   Cardiac Enzymes:  Recent Labs Lab 08/17/14 1846 08/19/14 1822  TROPONINI <0.03 <0.03    BNP (last 3 results) No results for input(s): BNP in the last 8760 hours.  ProBNP (last 3 results) No results for input(s): PROBNP in the last 8760 hours.  CBG: No results for input(s): GLUCAP in the last 168 hours.  Radiological Exams on Admission: Dg Chest 2 View  08/18/2014   CLINICAL DATA:  Epigastric pain.  Nausea and vomiting  EXAM: CHEST  2 VIEW  COMPARISON:  None.  FINDINGS: Mild bibasilar airspace disease most consistent with atelectasis. No definite pneumonia or effusion. Negative for heart failure.  IMPRESSION:  Mild bibasilar atelectasis.   Electronically Signed   By: Franchot Gallo M.D.   On: 08/18/2014 12:33   Ct Abdomen Pelvis W Contrast  08/18/2014   CLINICAL DATA:  64 year old female with epigastric abdominal pain for 3 days with nausea and vomiting. Initial encounter.  EXAM: CT ABDOMEN AND PELVIS WITH CONTRAST  TECHNIQUE: Multidetector CT imaging of the abdomen and pelvis was performed using the standard protocol following bolus administration of intravenous contrast.  CONTRAST:  30mL OMNIPAQUE IOHEXOL 300 MG/ML  SOLN  COMPARISON:  CT Abdomen and Pelvis 04/11/2014.  FINDINGS: Cardiomegaly. No pericardial or pleural effusion. Improved ventilation at the lung bases with less ground-glass and mosaic attenuation. Mild dependent atelectasis  now.  Degenerative changes in the spine. No acute osseous abnormality identified.  No pelvic free fluid.  Negative rectum.  Negative uterus and adnexa.  Severe bladder distension. Estimated bladder volume 588 mL. No bladder wall thickening or perivesical stranding.  Negative sigmoid colon. Decompressed and negative left colon. Negative transverse colon with gas and stool. Redundant hepatic flexure. Stool and oral contrast in the right colon. Normal appendix. Negative terminal ileum. No dilated small bowel. Decompressed stomach and duodenum.  Liver, gallbladder, spleen, pancreas and adrenal glands are within normal limits. The portal venous system is patent. Aortoiliac calcified atherosclerosis noted. Major arterial structures are patent. No abdominal free fluid.  Chronic hydronephrosis and extra renal pelvis. Symmetric renal contrast enhancement and excretion. Bilateral hydroureter without periureteral stranding or urologic calculus. No lymphadenopathy.  IMPRESSION: 1. Severe bladder distension (estimated bladder volume 588 mL) is felt responsible for bilateral hydroureter and hydronephrosis. Query bladder outlet obstruction. 2. No other acute or inflammatory process in the abdomen  or pelvis. Normal appendix.   Electronically Signed   By: Genevie Ann M.D.   On: 08/18/2014 16:00   Ct Head Limited W/o Cm  08/19/2014   CLINICAL DATA:  Dizzy spells for 2 weeks. Presyncope. Recent cerebral infarction.  EXAM: CT HEAD WITHOUT CONTRAST  TECHNIQUE: Contiguous axial images were obtained from the base of the skull through the vertex without intravenous contrast.  COMPARISON:  MR brain 06/22/2014. CT head 06/22/2014. Most recent CT 07/24/2014.  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 No new areas of cerebral infarction are definitely identified.  No hemorrhage, mass lesion, hydrocephalus, or extra-axial fluid. Mild atrophy and small vessel disease. Calvarium intact. No sinus or mastoid disease. No change from March scan.  IMPRESSION: Chronic LEFT occipital infarct.  No acute features are evident.   Electronically Signed   By: Rolla Flatten M.D.   On: 08/19/2014 13:39    EKG: Independently reviewed. Sinus bradycardia with QTC of 437 ms and nonspecific T-wave changes.  Assessment/Plan Principal Problem:   Dizziness Active Problems:   Essential hypertension   History of stroke   CKD (chronic kidney disease) stage 3, GFR 30-59 ml/min   1. Dizziness - at this time since patient is having persistent dizziness I have ordered MRI brain and we will get physical therapy consult. Closely monitor in telemetry. Check orthostatics. Since patient also complains of headache have ordered sedimentation rate. 2. History of CVA - continue antiplatelet agents. 3. Hypertension - continue home medications and I have placed patient on when necessary IV hydralazine for systolic blood pressure more than 160. 4. Chronic in disease stage III - appears to be at baseline. Closely follow metabolic panel intake output.  Patient's charts mention of diabetes mellitus and CHF. Patient does not take any medication for these.  We will check hemoglobin A1c.  I have reviewed patient's charts which was faxed from [facility]. I also reviewed patient's recent chest x-ray done yesterday.   DVT Prophylaxis Lovenox.  Code Status: Full code.  Family Communication: Discussed with patient.  Disposition Plan: Admit for observation.    Teegan Guinther N. [facility] Pager [phone].  If 7PM-7AM, please contact night-coverage [email or web address] Password [facility] 08/19/2014, 11:43 PM

## 2014-08-19 NOTE — ED Notes (Signed)
Bed: WA07 Expected date:  Expected time:  Means of arrival:  Comments: EMS- 63yo F, dizziness, n/v

## 2014-08-19 NOTE — ED Notes (Signed)
MD Ralene Bathe to bedside updating patient about admission.

## 2014-08-19 NOTE — ED Notes (Signed)
Hospitalist at bedside 

## 2014-08-19 NOTE — ED Notes (Signed)
Pt presents to ED via [facility].  Per EMS, pt complains of headache, nausea, and vomiting.  Pt seen at [facility] less than two hours ago and discharged after full workup.  Called EMS less than an hour after discharge and requested [facility].  Pt seen multiple times in multiple ERs over the past week for the same complaints.  Pt states symptom onset was in January 2016 after CVA.  Pt reports to EMS that she may have had a syncopal episode in her recliner at home.  Pt A/Ox4 upon EMS arrival. No injuries noted, no acute distress noted.  Pt has foley catheter that was placed 4/20 for urinary retention.  Pt states when she lays down she feels fine.  Pt complaint of headache behind both eyes.  Pt reports blurred vision in the right eye following her stroke in January.  Pt reports occasional pressure behind right eye. Pt has a stutter since CVA that worsens when anxious. Stutter is mild at this time.

## 2014-08-19 NOTE — ED Provider Notes (Signed)
CSN: [id number]     Arrival date & time 08/19/14  1713 History   First MD Initiated Contact with Patient 08/19/14 1722     Chief Complaint  Patient presents with  . Headache  . Nausea    Patient is a 64 y.o. female presenting with headaches. The history is provided by the patient. No language interpreter was used.  Headache  Ms. Kau presents for evaluation of dizziness.  She reports that when she goes to stand she feels dizzy like she will pass out.  She has associated hot sensation in her face, nausea, frontal headache and right eye pressure.  Sxs improve when she goes to lie down.  She was seen in the [place] ER earlier today for similar sxs and discharged home.  She was also seen at [facility] two days ago.  She denies fevers, vomiting, SOB.  She occasionally has chest tightness with standing.  She had abdominal pain and diarrhea a few days ago, now gone.  She lives with her son and mother.  Sxs are moderate, waxing and waning, worsening.    Past Medical History  Diagnosis Date  . Stroke   . CHF (congestive heart failure)   . Diabetes mellitus without complication   . MI (myocardial infarction)    History reviewed. No pertinent past surgical history. History reviewed. No pertinent family history. History  Substance Use Topics  . Smoking status: Former Smoker    Quit date: 03/30/2014  . Smokeless tobacco: Not on file  . Alcohol Use: No   OB History    No data available     Review of Systems  Neurological: Positive for headaches.  All other systems reviewed and are negative.     Allergies  Review of patient's allergies indicates no known allergies.  Home Medications   Prior to Admission medications   Medication Sig Start Date End Date Taking? Authorizing Provider  amLODipine (NORVASC) 10 MG tablet Take 10 mg by mouth daily.   Yes Historical Provider, MD  aspirin EC 81 MG tablet Take 81 mg by mouth daily.   Yes Historical Provider, MD  cloNIDine (CATAPRES) 0.2 MG  tablet Take 1 tablet (0.2 mg total) by mouth 2 (two) times daily. Patient taking differently: Take 0.2 mg by mouth 3 (three) times daily.  [id number]  Yes Delora Fuel, MD  fluticasone [facility]) 50 MCG/ACT nasal spray Place 2 sprays into both nostrils daily as needed for allergies (allergies).    Yes Historical Provider, MD  hydrALAZINE (APRESOLINE) 50 MG tablet Take 50 mg by mouth 3 (three) times daily.   Yes Historical Provider, MD  isosorbide mononitrate (IMDUR) 30 MG 24 hr tablet Take 30 mg by mouth daily.   Yes Historical Provider, MD  loratadine (CLARITIN) 10 MG tablet Take 10 mg by mouth daily.   Yes Historical Provider, MD  lovastatin (MEVACOR) 40 MG tablet Take 40 mg by mouth at bedtime.   Yes Historical Provider, MD  metoprolol succinate (TOPROL-XL) 25 MG 24 hr tablet Take 25 mg by mouth at bedtime.    Yes Historical Provider, MD  ondansetron (ZOFRAN) 4 MG tablet Take 4 mg by mouth every 8 (eight) hours as needed for nausea or vomiting (nausea).    Yes Historical Provider, MD   BP 142/67 mmHg  Pulse 57  Temp(Src) 97.8 F (36.6 C) (Oral)  Resp 16  Ht 5\' 1"  (1.549 m)  Wt 174 lb (78.926 kg)  BMI 32.89 kg/m2  SpO2 100% Physical Exam  Constitutional: She  is oriented to person, place, and time. She appears well-developed and well-nourished.  HENT:  Head: Normocephalic and atraumatic.  Eyes: EOM are normal. Pupils are equal, round, and reactive to light.  Cardiovascular: Normal rate and regular rhythm.   No murmur heard. Pulmonary/Chest: Effort normal and breath sounds normal. No respiratory distress.  Abdominal: Soft. There is no rebound and no guarding.  Mild suprapubic tenderness  Genitourinary:  Foley catheter in place  Musculoskeletal: She exhibits no edema or tenderness.  Neurological: She is alert and oriented to person, place, and time. Coordination normal.  No facial asymmetry  Skin: Skin is warm and dry.  Psychiatric: She has a normal mood and affect. Her behavior is  normal.  Nursing note and vitals reviewed.   ED Course  Procedures (including critical care time) Labs Review Labs Reviewed - No data to display  Imaging Review Dg Chest 2 View  08/18/2014   CLINICAL DATA:  Epigastric pain.  Nausea and vomiting  EXAM: CHEST  2 VIEW  COMPARISON:  None.  FINDINGS: Mild bibasilar airspace disease most consistent with atelectasis. No definite pneumonia or effusion. Negative for heart failure.  IMPRESSION: Mild bibasilar atelectasis.   Electronically Signed   By: Franchot Gallo M.D.   On: 08/18/2014 12:33   Ct Abdomen Pelvis W Contrast  08/18/2014   CLINICAL DATA:  64 year old female with epigastric abdominal pain for 3 days with nausea and vomiting. Initial encounter.  EXAM: CT ABDOMEN AND PELVIS WITH CONTRAST  TECHNIQUE: Multidetector CT imaging of the abdomen and pelvis was performed using the standard protocol following bolus administration of intravenous contrast.  CONTRAST:  17mL OMNIPAQUE IOHEXOL 300 MG/ML  SOLN  COMPARISON:  CT Abdomen and Pelvis 04/11/2014.  FINDINGS: Cardiomegaly. No pericardial or pleural effusion. Improved ventilation at the lung bases with less ground-glass and mosaic attenuation. Mild dependent atelectasis now.  Degenerative changes in the spine. No acute osseous abnormality identified.  No pelvic free fluid.  Negative rectum.  Negative uterus and adnexa.  Severe bladder distension. Estimated bladder volume 588 mL. No bladder wall thickening or perivesical stranding.  Negative sigmoid colon. Decompressed and negative left colon. Negative transverse colon with gas and stool. Redundant hepatic flexure. Stool and oral contrast in the right colon. Normal appendix. Negative terminal ileum. No dilated small bowel. Decompressed stomach and duodenum.  Liver, gallbladder, spleen, pancreas and adrenal glands are within normal limits. The portal venous system is patent. Aortoiliac calcified atherosclerosis noted. Major arterial structures are patent. No  abdominal free fluid.  Chronic hydronephrosis and extra renal pelvis. Symmetric renal contrast enhancement and excretion. Bilateral hydroureter without periureteral stranding or urologic calculus. No lymphadenopathy.  IMPRESSION: 1. Severe bladder distension (estimated bladder volume 588 mL) is felt responsible for bilateral hydroureter and hydronephrosis. Query bladder outlet obstruction. 2. No other acute or inflammatory process in the abdomen or pelvis. Normal appendix.   Electronically Signed   By: Genevie Ann M.D.   On: 08/18/2014 16:00   Ct Head Limited W/o Cm  08/19/2014   CLINICAL DATA:  Dizzy spells for 2 weeks. Presyncope. Recent cerebral infarction.  EXAM: CT HEAD WITHOUT CONTRAST  TECHNIQUE: Contiguous axial images were obtained from the base of the skull through the vertex without intravenous contrast.  COMPARISON:  MR brain 06/22/2014. CT head 06/22/2014. Most recent CT 07/24/2014.  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 No new areas of cerebral infarction are definitely identified.  No hemorrhage, mass  lesion, hydrocephalus, or extra-axial fluid. Mild atrophy and small vessel disease. Calvarium intact. No sinus or mastoid disease. No change from March scan.  IMPRESSION: Chronic LEFT occipital infarct.  No acute features are evident.   Electronically Signed   By: Rolla Flatten M.D.   On: 08/19/2014 13:39     EKG Interpretation   Date/Time:  Thursday August 19 2014 18:31:50 EDT Ventricular Rate:  56 PR Interval:  243 QRS Duration: 91 QT Interval:  453 QTC Calculation: 437 R Axis:   64 Text Interpretation:  Sinus rhythm Prolonged PR interval Consider left  ventricular hypertrophy Nonspecific T abnormalities, diffuse leads  Confirmed by Hazle Coca [phone]) on 08/19/2014 6:34:50 PM      MDM   Final diagnoses:  Syncope, unspecified syncope type  Bad headache    Patient here for evaluation  of headache and near syncope when she stands. On repeat evaluation when the family arrived, family endorse that she had the patient has had multiple syncopal events when she stands, 2 of them occurred today while she was at another hospital. Patient denies any current headache but she does endorse headaches when she gets up. Reviewed [facility] from [facility], patient had a CT head performed earlier today that was negative for any acute abnormalities. Discussed with hospitalist regarding admission for evaluation of syncope.    Quintella Reichert, MD 08/19/14 [phone]

## 2014-08-19 NOTE — ED Notes (Signed)
Medical Records obtained and given to MD Ralene Bathe by NT Candice. Will update patient.

## 2014-08-19 NOTE — ED Notes (Signed)
Attempted to call report to 5 E. RN is off floor. Will call back.

## 2014-08-19 NOTE — ED Notes (Addendum)
Pt appears to be anxious upon ambulation. Pt ambulated with two RN's at side with steady gait but visitor reports not as normal. She reports that when she sit up and stands she gets a pressure in her face and states "I feel like I'm in a stance". Upon sitting and standing patient states "I'm going to pass out" she begins crying. Pt told to take slow deep breaths and she recovers and able to perform task. Will notify MD.

## 2014-08-19 NOTE — ED Notes (Signed)
Pt reporting that she  Passes out when she stands up. Per family her BP goes up when she stands and does not drop.

## 2014-08-19 NOTE — ED Notes (Signed)
Nurse currently starting IV 

## 2014-08-19 NOTE — ED Notes (Signed)
Attempted to cal report to 5 th floor.

## 2014-08-20 ENCOUNTER — Ambulatory Visit (HOSPITAL_COMMUNITY): Payer: Medicare Other

## 2014-08-20 ENCOUNTER — Observation Stay (HOSPITAL_COMMUNITY): Payer: Medicare Other

## 2014-08-20 DIAGNOSIS — I6523 Occlusion and stenosis of bilateral carotid arteries: Secondary | ICD-10-CM | POA: Diagnosis not present

## 2014-08-20 DIAGNOSIS — R339 Retention of urine, unspecified: Secondary | ICD-10-CM | POA: Diagnosis not present

## 2014-08-20 DIAGNOSIS — I10 Essential (primary) hypertension: Secondary | ICD-10-CM | POA: Diagnosis not present

## 2014-08-20 DIAGNOSIS — I639 Cerebral infarction, unspecified: Secondary | ICD-10-CM | POA: Diagnosis not present

## 2014-08-20 DIAGNOSIS — R42 Dizziness and giddiness: Secondary | ICD-10-CM | POA: Diagnosis not present

## 2014-08-20 DIAGNOSIS — N183 Chronic kidney disease, stage 3 (moderate): Secondary | ICD-10-CM | POA: Diagnosis not present

## 2014-08-20 LAB — COMPREHENSIVE METABOLIC PANEL
ALBUMIN: 4.3 g/dL (ref 3.5–5.2)
ALK PHOS: 57 U/L (ref 39–117)
ALT: 16 U/L (ref 0–35)
AST: 16 U/L (ref 0–37)
Anion gap: 7 (ref 5–15)
BUN: 12 mg/dL (ref 6–23)
CO2: 24 mmol/L (ref 19–32)
CREATININE: 1.17 mg/dL — AB (ref 0.50–1.10)
Calcium: 9.8 mg/dL (ref 8.4–10.5)
Chloride: 108 mmol/L (ref 96–112)
GFR calc non Af Amer: 48 mL/min — ABNORMAL LOW (ref >90)
GFR, EST AFRICAN AMERICAN: 55 mL/min — AB (ref >90)
Glucose, Bld: 88 mg/dL (ref 70–99)
Potassium: 3.6 mmol/L (ref 3.5–5.1)
SODIUM: 139 mmol/L (ref 135–145)
TOTAL PROTEIN: 7.5 g/dL (ref 6.0–8.3)
Total Bilirubin: 0.8 mg/dL (ref 0.3–1.2)

## 2014-08-20 LAB — RAPID URINE DRUG SCREEN, HOSP PERFORMED
Amphetamines: NOT DETECTED
BENZODIAZEPINES: NOT DETECTED
Barbiturates: NOT DETECTED
Cocaine: NOT DETECTED
OPIATES: NOT DETECTED
Tetrahydrocannabinol: NOT DETECTED

## 2014-08-20 LAB — CBC
HEMATOCRIT: 43.5 % (ref 36.0–46.0)
HEMOGLOBIN: 13.9 g/dL (ref 12.0–15.0)
MCH: 30.7 pg (ref 26.0–34.0)
MCHC: 32 g/dL (ref 30.0–36.0)
MCV: 96 fL (ref 78.0–100.0)
Platelets: 188 10*3/uL (ref 150–400)
RBC: 4.53 MIL/uL (ref 3.87–5.11)
RDW: 12.2 % (ref 11.5–15.5)
WBC: 3.5 10*3/uL — AB (ref 4.0–10.5)

## 2014-08-20 LAB — SEDIMENTATION RATE: Sed Rate: 13 mm/hr (ref 0–22)

## 2014-08-20 LAB — CORTISOL: Cortisol, Plasma: 14.5 ug/dL

## 2014-08-20 LAB — TSH: TSH: 0.793 u[IU]/mL (ref 0.350–4.500)

## 2014-08-20 MED ORDER — PRAVASTATIN SODIUM 40 MG PO TABS
40.0000 mg | ORAL_TABLET | Freq: Every day | ORAL | Status: DC
  Administered 2014-08-20 – 2014-08-21 (×2): 40 mg via ORAL
  Filled 2014-08-20 (×3): qty 1

## 2014-08-20 MED ORDER — ISOSORBIDE MONONITRATE ER 30 MG PO TB24
30.0000 mg | ORAL_TABLET | Freq: Every day | ORAL | Status: DC
  Administered 2014-08-20: 30 mg via ORAL
  Filled 2014-08-20 (×2): qty 1

## 2014-08-20 MED ORDER — SODIUM CHLORIDE 0.9 % IV SOLN
INTRAVENOUS | Status: DC
  Administered 2014-08-20 – 2014-08-21 (×2): via INTRAVENOUS

## 2014-08-20 MED ORDER — SODIUM CHLORIDE 0.9 % IV BOLUS (SEPSIS)
500.0000 mL | Freq: Once | INTRAVENOUS | Status: AC
Start: 2014-08-20 — End: 2014-08-20
  Administered 2014-08-20: 500 mL via INTRAVENOUS

## 2014-08-20 MED ORDER — ENOXAPARIN SODIUM 40 MG/0.4ML ~~LOC~~ SOLN
40.0000 mg | Freq: Every day | SUBCUTANEOUS | Status: DC
  Administered 2014-08-20 – 2014-08-22 (×3): 40 mg via SUBCUTANEOUS
  Filled 2014-08-20 (×4): qty 0.4

## 2014-08-20 MED ORDER — CLONIDINE HCL 0.2 MG PO TABS
0.2000 mg | ORAL_TABLET | Freq: Three times a day (TID) | ORAL | Status: DC
Start: 2014-08-20 — End: 2014-08-20
  Administered 2014-08-20 (×2): 0.2 mg via ORAL
  Filled 2014-08-20 (×3): qty 1

## 2014-08-20 MED ORDER — SODIUM CHLORIDE 0.9 % IJ SOLN
3.0000 mL | Freq: Two times a day (BID) | INTRAMUSCULAR | Status: DC
  Administered 2014-08-20 – 2014-08-22 (×5): 3 mL via INTRAVENOUS

## 2014-08-20 MED ORDER — ACETAMINOPHEN 325 MG PO TABS
650.0000 mg | ORAL_TABLET | Freq: Four times a day (QID) | ORAL | Status: DC | PRN
  Administered 2014-08-20 – 2014-08-21 (×2): 650 mg via ORAL
  Filled 2014-08-20 (×2): qty 2

## 2014-08-20 MED ORDER — SENNOSIDES-DOCUSATE SODIUM 8.6-50 MG PO TABS: 1.0000 | ORAL_TABLET | Freq: Every evening | ORAL | Status: DC | PRN

## 2014-08-20 MED ORDER — HYDRALAZINE HCL 50 MG PO TABS
50.0000 mg | ORAL_TABLET | Freq: Three times a day (TID) | ORAL | Status: DC
Start: 2014-08-20 — End: 2014-08-20
  Administered 2014-08-20 (×2): 50 mg via ORAL
  Filled 2014-08-20 (×3): qty 1

## 2014-08-20 MED ORDER — FLUTICASONE PROPIONATE 50 MCG/ACT NA SUSP
2.0000 | Freq: Every day | NASAL | Status: DC | PRN
  Filled 2014-08-20: qty 16

## 2014-08-20 MED ORDER — ASPIRIN EC 81 MG PO TBEC
81.0000 mg | DELAYED_RELEASE_TABLET | Freq: Every day | ORAL | Status: DC
  Administered 2014-08-20 – 2014-08-22 (×3): 81 mg via ORAL
  Filled 2014-08-20 (×3): qty 1

## 2014-08-20 MED ORDER — AMLODIPINE BESYLATE 10 MG PO TABS
10.0000 mg | ORAL_TABLET | Freq: Every day | ORAL | Status: DC
Start: 2014-08-20 — End: 2014-08-21
  Administered 2014-08-20: 10 mg via ORAL
  Filled 2014-08-20 (×2): qty 1

## 2014-08-20 MED ORDER — LORATADINE 10 MG PO TABS
10.0000 mg | ORAL_TABLET | Freq: Every day | ORAL | Status: DC
  Administered 2014-08-20 – 2014-08-22 (×3): 10 mg via ORAL
  Filled 2014-08-20 (×3): qty 1

## 2014-08-20 MED ORDER — ONDANSETRON HCL 4 MG/2ML IJ SOLN
4.0000 mg | Freq: Four times a day (QID) | INTRAMUSCULAR | Status: DC | PRN
  Administered 2014-08-20: 4 mg via INTRAVENOUS
  Filled 2014-08-20: qty 2

## 2014-08-20 MED ORDER — METOPROLOL SUCCINATE ER 25 MG PO TB24
25.0000 mg | ORAL_TABLET | Freq: Every day | ORAL | Status: DC
  Administered 2014-08-20: 25 mg via ORAL
  Filled 2014-08-20 (×3): qty 1

## 2014-08-20 MED ORDER — IOHEXOL 350 MG/ML SOLN
100.0000 mL | Freq: Once | INTRAVENOUS | Status: AC | PRN
  Administered 2014-08-20: 100 mL via INTRAVENOUS

## 2014-08-20 MED ORDER — HYDRALAZINE HCL 20 MG/ML IJ SOLN: 5.0000 mg | INTRAMUSCULAR | Status: DC | PRN

## 2014-08-20 MED ORDER — LORAZEPAM 2 MG/ML IJ SOLN: 0.5000 mg | Freq: Once | INTRAMUSCULAR | Status: DC

## 2014-08-20 NOTE — Progress Notes (Signed)
UR completed 

## 2014-08-20 NOTE — Progress Notes (Addendum)
[facility] Progress Note   Tannika Copeman [id number] DOB: Jan 21, 1951 DOA: 08/19/2014 PCP: Rico Sheehan  Brief narrative: Leslie Houston is a 64 y.o. female history of recent stroke in February 2016 with residual right-sided visual defect and difficulty speaking, history of hypertension hyperlipidemia who presented to the hospital for dizziness on standing. CT of the head has been negative. He continues to be dizzy and had a near syncopal event today and stood up at the bedside to do orthostatic vitals.   Subjective: Does not feel dizzy when sitting in bed. Does not complain of vertigo when she stands but mostly feels lightheaded. She has no other complaints  Assessment/Plan: Principal Problem:   Dizziness- near syncope today when standing -Orthostatic vitals are negative - she states she is too claustrophobic to have an MRI - We'll obtain a CTA of the head and neck and a carotid duplex to further evaluate  Active Problems:   Urinary retention -Likely catheter placed in the ER she was unable to void-we'll remove Foley and do voiding trial today    Essential hypertension - BP noted to drop late this AM- will have RN recheck and hold antihypertensives if continues to be low    History of stroke - cont baby aspirin    CKD (chronic kidney disease) stage 3, GFR 30-59 ml/min - stable   Appt with PCP: Code Status: full code Family Communication:  Disposition Plan: home when DVT prophylaxis: Lovenox Consultants: Procedures:  Antibiotics: Anti-infectives    None      Objective: Filed Weights   08/19/14 1725  Weight: 78.926 kg (174 lb)   No intake or output data in the 24 hours ending 08/20/14 1555   Vitals Filed Vitals:   08/20/14 0030 08/20/14 0045 08/20/14 0900 08/20/14 1443  BP: 142/56 167/72 154/45 86/51  Pulse: 54 51 56 54  Temp:  97.7 F (36.5 C) 97.7 F (36.5 C) 97.6 F (36.4 C)  TempSrc:  Oral Oral Oral  Resp: 16 16 18 18   Height:       Weight:      SpO2: 94% 99% 100% 100%    Exam:  General:  Pt is alert, not in acute distress  HEENT: No icterus, No thrush  Cardiovascular: regular rate and rhythm, S1/S2 No murmur  Respiratory: clear to auscultation bilaterally   Abdomen: Soft, +Bowel sounds, non tender, non distended, no guarding  MSK: No LE edema, cyanosis or clubbing  Data Reviewed: Basic Metabolic Panel:  Recent Labs Lab 08/17/14 1846 08/18/14 1150 08/18/14 1823 08/19/14 1822 08/20/14 0539  NA 140 140 139 139 139  K 3.8 5.8* 3.9 3.7 3.6  CL 104 105 106 108 108  CO2 27 26 22 23 24   GLUCOSE 81 96 74 90 88  BUN 14 14 11 14 12   CREATININE 1.48* 1.47* 1.35* 1.28* 1.17*  CALCIUM 9.9 9.7 9.6 10.2 9.8   Liver Function Tests:  Recent Labs Lab 08/17/14 1846 08/18/14 1150 08/19/14 1822 08/20/14 0539  AST 18 50* 21 16  ALT 19 26 17 16   ALKPHOS 57 56 59 57  BILITOT 0.7 1.7* 0.8 0.8  PROT 7.3 7.1 7.9 7.5  ALBUMIN 4.1 4.1 4.3 4.3    Recent Labs Lab 08/18/14 1150  LIPASE 24   No results for input(s): AMMONIA in the last 168 hours. CBC:  Recent Labs Lab 08/17/14 1846 08/18/14 1150 08/19/14 1822 08/20/14 0539  WBC 4.0 3.0* 5.0 3.5*  NEUTROABS 2.1 1.8 3.5  --   HGB 13.2 13.0 14.3  13.9  HCT 39.7 40.7 42.8 43.5  MCV 95.2 95.1 96.0 96.0  PLT 205 190 197 188   Cardiac Enzymes:  Recent Labs Lab 08/17/14 1846 08/19/14 1822  TROPONINI <0.03 <0.03   BNP (last 3 results) No results for input(s): BNP in the last 8760 hours.  ProBNP (last 3 results) No results for input(s): PROBNP in the last 8760 hours.  CBG: No results for input(s): GLUCAP in the last 168 hours.  No results found for this or any previous visit (from the past 240 hour(s)).   Studies:  Recent x-ray studies have been reviewed in detail by the Attending Physician  Scheduled Meds:  Scheduled Meds: . amLODipine  10 mg Oral Daily  . aspirin EC  81 mg Oral Daily  . cloNIDine  0.2 mg Oral TID  . enoxaparin  (LOVENOX) injection  40 mg Subcutaneous QHS  . hydrALAZINE  50 mg Oral TID  . isosorbide mononitrate  30 mg Oral Daily  . loratadine  10 mg Oral Daily  . LORazepam  0.5 mg Intravenous Once  . metoprolol succinate  25 mg Oral QHS  . pravastatin  40 mg Oral q1800  . sodium chloride  3 mL Intravenous Q12H   Continuous Infusions:   Time spent on care of this patient: 55 min  [place], MD 08/20/2014, 3:55 PM    [facility] Office  [phone] Pager - Text Page per [email or web address]  If 7PM-7AM, please contact night-coverage [email or web address]

## 2014-08-20 NOTE — Evaluation (Signed)
Physical Therapy Evaluation Patient Details Name: Leslie Houston MRN: [id number] DOB: [birth year] Today's Date: 08/20/2014   History of Present Illness  pt was admitted with dizziness.  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  Clinical Impression  RN in room to take Orthostatics as PT/OT performing evakluations., patient assisted to standing  And within 15 seconds noted to  Stiffen in trunk, head back, unresponsive. Assisted to supine. Patient "comes to" in less than a minute. Patient reports that her face begins to feel full and painful as episode  Increases. This occurred x 2 trials. Does not exhibit any symptoms in sitting with legs dependent. Patient will benefit from PT to address problems listed in note below.    Follow Up Recommendations Home health PT;Supervision/Assistance - 24 hour    Equipment Recommendations   (tbd)    Recommendations for Other Services       Precautions / Restrictions Precautions Precautions: Fall Precaution Comments: may "Pass out" when standing up Restrictions Weight Bearing Restrictions: No      Mobility  Bed Mobility Overal bed mobility: Needs Assistance Bed Mobility: Rolling;Sidelying to Sit Rolling: Min assist Sidelying to sit: Min assist       General bed mobility comments: assist for trunk when sitting up, after standing  and patient has 'syncopal" episode, patient assisted to sitting and  fallss back onto bed., this was x 2 experiences  Transfers Overall transfer level: Needs assistance Equipment used: 2 person hand held assist Transfers: Sit to/from Stand Sit to Stand: Min assist;Total assist         General transfer comment: pt became unresponsive twice when we stood twice,   Ambulation/Gait                Stairs            Wheelchair Mobility    Modified Rankin (Stroke Patients Only)       Balance Overall balance assessment: Needs assistance Sitting-balance support: Feet supported;Bilateral upper  extremity supported Sitting balance-Leahy Scale: Fair     Standing balance support: Bilateral upper extremity supported;During functional activity Standing balance-Leahy Scale: Poor Standing balance comment: becomes unresponsive                             Pertinent Vitals/Pain Pain Assessment: Faces Faces Pain Scale: Hurts even more Pain Location: R side of  face and head when sensation of "going out" starts. Pain Descriptors / Indicators: Pressure;Pounding Pain Intervention(s): Limited activity within patient's tolerance    Home Living Family/patient expects to be discharged to:: Private residence Living Arrangements: Children;Parent             Home Equipment: Cane - quad (no BSC) Additional Comments: pt always has someone with her    Prior Function Level of Independence: Needs assistance         Comments: recent decline due to dizziness/lightheadedness     Hand Dominance        Extremity/Trunk Assessment   Upper Extremity Assessment: Overall WFL for tasks assessed           Lower Extremity Assessment: Overall WFL for tasks assessed      Cervical / Trunk Assessment: Normal  Communication   Communication: No difficulties (does stutter)  Cognition Arousal/Alertness: Awake/alert Behavior During Therapy: WFL for tasks assessed/performed Overall Cognitive Status: Within Functional Limits for tasks assessed (pt does have word finding difficulties)  General Comments      Exercises        Assessment/Plan    PT Assessment Patient needs continued PT services  PT Diagnosis Difficulty walking;Other (comment) ("syncope-like episodes")   PT Problem List Decreased activity tolerance;Decreased balance;Decreased mobility;Cardiopulmonary status limiting activity;Decreased safety awareness;Pain  PT Treatment Interventions DME instruction;Gait training;Functional mobility training;Therapeutic activities;Therapeutic  exercise;Patient/family education   PT Goals (Current goals can be found in the Care Plan section) Acute Rehab PT Goals Patient Stated Goal: get this taken care of so I can go home and not come back PT Goal Formulation: With patient Time For Goal Achievement: 09/03/14 Potential to Achieve Goals: Good    Frequency Min 3X/week   Barriers to discharge        Co-evaluation PT/OT/SLP Co-Evaluation/Treatment: Yes Reason for Co-Treatment: Complexity of the patient's impairments (multi-system involvement);For patient/therapist safety PT goals addressed during session: Mobility/safety with mobility OT goals addressed during session: ADL's and self-care       End of Session   Activity Tolerance: Treatment limited secondary to medical complications (Comment) Patient left: in bed;with call bell/phone within reach;with bed alarm set Nurse Communication: Mobility status (RN in room during syncopal episodes, RN attempting to get orthostatics.)    Functional Assessment Tool Used: clinical judgement Functional Limitation: Mobility: Walking and moving around Mobility: Walking and Moving Around Current Status [id number]): 100 percent impaired, limited or restricted Mobility: Walking and Moving Around Goal Status [id number]): At least 1 percent but less than 20 percent impaired, limited or restricted    Time: 1313-1344 PT Time Calculation (min) (ACUTE ONLY): 31 min   Charges:   PT Evaluation $Initial PT Evaluation Tier I: 1 Procedure     PT G Codes:   PT G-Codes **NOT FOR INPATIENT CLASS** Functional Assessment Tool Used: clinical judgement Functional Limitation: Mobility: Walking and moving around Mobility: Walking and Moving Around Current Status [id number]): 100 percent impaired, limited or restricted Mobility: Walking and Moving Around Goal Status [id number]): At least 1 percent but less than 20 percent impaired, limited or restricted    Claretha Cooper 08/20/2014, 4:00 PM Tresa Endo  PT [phone]

## 2014-08-20 NOTE — Evaluation (Signed)
Occupational Therapy Evaluation Patient Details Name: Leslie Houston MRN: [id number] DOB: 02-16-1951 Today's Date: 08/20/2014    History of Present Illness pt was admitted with dizziness.  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   Clinical Impression   This 64 year old female was admitted for the above.  During evaluation, RN was present to take orthostatic vitals, PT BLACKED OUT/BECAME UNRESPONSIVE WHEN STANDING FOR BP X 2.  ADLs are limited by this.  Prior to onset of these episodes, pt was mod I with adls.  Goals are set for supervision to min guard in acute.    Follow Up Recommendations  Supervision/Assistance - 24 hour;SNF (depending upon progress)    Equipment Recommendations   (possibly 3:1 commode)    Recommendations for Other Services       Precautions / Restrictions Precautions Precautions: Fall Restrictions Weight Bearing Restrictions: No      Mobility Bed Mobility Overal bed mobility: Needs Assistance Bed Mobility: Rolling;Sidelying to Sit Rolling: Min assist Sidelying to sit: Min assist       General bed mobility comments: assist for trunk when sitting up  Transfers Overall transfer level: Needs assistance Equipment used: 2 person hand held assist Transfers: Sit to/from Stand Sit to Stand: Min assist;Total assist (min A to stand; total A to control descent:  unresponsive)         General transfer comment: pt became unresponsive twice when we stood twice    Balance Overall balance assessment: Needs assistance Sitting-balance support: Feet supported Sitting balance-Leahy Scale: Fair     Standing balance support: Bilateral upper extremity supported Standing balance-Leahy Scale: Poor                              ADL Overall ADL's : Needs assistance/impaired     Grooming: Set up;Sitting   Upper Body Bathing: Set up;Sitting   Lower Body Bathing: Minimal assistance;Bed level   Upper Body Dressing : Set up;Sitting   Lower  Body Dressing: Minimal assistance;Bed level   Toilet Transfer: Total assistance (bedpan)             General ADL Comments: Assessed pt for ADLs, bed level.  RN present taking orthostatic vitals:  unable to get vitals in standing as pt became unresponsive.  After a rest, tried again, but same result.  Initially pt said she was spinning when she got dizzy, and after episodes, she said this is what I've been trying to tell them.  She states she feels something, but she was unable to communicate that she felt anything different second time it happened.  Likely not a vestibular problem--this needs to be addressed first and if spinning is present, we can further assess that.  Nursing was able to get orthostatic vitals yesterday     Vision     Perception     Praxis      Pertinent Vitals/Pain Pain Assessment: Faces Faces Pain Scale: Hurts even more Pain Location: head/face Pain Descriptors / Indicators:  (fullness/pressure) Pain Intervention(s): Limited activity within patient's tolerance     Hand Dominance     Extremity/Trunk Assessment Upper Extremity Assessment Upper Extremity Assessment: Overall WFL for tasks assessed           Communication Communication Communication: No difficulties   Cognition Arousal/Alertness: Awake/alert Behavior During Therapy: WFL for tasks assessed/performed Overall Cognitive Status: Within Functional Limits for tasks assessed (pt does have word finding difficulties)  General Comments       Exercises       Shoulder Instructions      Home Living Family/patient expects to be discharged to:: Private residence Living Arrangements: Children;Parent                           Home Equipment: Cane - quad (no BSC)   Additional Comments: pt always has someone with her      Prior Functioning/Environment Level of Independence: Needs assistance        Comments: recent decline due to  dizziness/lightheadedness    OT Diagnosis: Generalized weakness;Acute pain;Disturbance of vision   OT Problem List: Decreased activity tolerance;Impaired balance (sitting and/or standing);Cardiopulmonary status limiting activity;Pain;Impaired vision/perception   OT Treatment/Interventions: Self-care/ADL training;DME and/or AE instruction;Balance training;Patient/family education;Therapeutic activities    OT Goals(Current goals can be found in the care plan section) Acute Rehab OT Goals Patient Stated Goal: get this taken care of so I can go home and not come back OT Goal Formulation: With patient Time For Goal Achievement: 08/27/14 Potential to Achieve Goals: Good ADL Goals Pt Will Perform Lower Body Bathing: with supervision;sit to/from stand Pt Will Perform Lower Body Dressing: with supervision;sit to/from stand Pt Will Transfer to Toilet: with min guard assist;ambulating;bedside commode;regular height toilet (vs) Pt Will Perform Toileting - Clothing Manipulation and hygiene: with supervision;sit to/from stand Additional ADL Goal #1: pt will complete bed mobility at supervision level from flat bed in preparation for adls/toilet transfers  OT Frequency: Min 2X/week   Barriers to D/C:            Co-evaluation PT/OT/SLP Co-Evaluation/Treatment: Yes Reason for Co-Treatment: For patient/therapist safety PT goals addressed during session: Mobility/safety with mobility OT goals addressed during session: ADL's and self-care      End of Session Nurse Communication:  (present for incident)  Activity Tolerance: Treatment limited secondary to medical complications (Comment) (pt "blacked out") Patient left: in bed;with call bell/phone within reach;with bed alarm set   Time: [id number] OT Time Calculation (min): 29 min Charges:  OT General Charges $OT Visit: 1 Procedure OT Evaluation $Initial OT Evaluation Tier I: 1 Procedure G-Codes: OT G-codes **NOT FOR INPATIENT  CLASS** Functional Assessment Tool Used: clinical observation and judgment Functional Limitation: Self care Self Care Current Status [id number]): At least 80 percent but less than 100 percent impaired, limited or restricted Self Care Goal Status [id number]): At least 1 percent but less than 20 percent impaired, limited or restricted  Leslie Houston 08/20/2014, 2:40 PM   Lesle Chris, OTR/L [phone] 08/20/2014

## 2014-08-20 NOTE — Progress Notes (Addendum)
RN spoke with patient about possibility of removing foley and attempting voiding trial. Patient shared with RN that urinary retention has been a problem since her stroke. States "I can empty my bladder but the doctors tell me I'm not getting all of it out." She mentioned "The doctor said he might have to put a tube in the front to drain my bladder." She stated that her current foley was placed at [facility] and she was sent home with it. Due to the retention issue being more than acute, this RN will leave the foley in tonight to assure this is addressed appropriately. Night MD aware.

## 2014-08-21 DIAGNOSIS — I10 Essential (primary) hypertension: Secondary | ICD-10-CM | POA: Diagnosis not present

## 2014-08-21 DIAGNOSIS — R42 Dizziness and giddiness: Secondary | ICD-10-CM | POA: Diagnosis not present

## 2014-08-21 DIAGNOSIS — R339 Retention of urine, unspecified: Secondary | ICD-10-CM | POA: Diagnosis not present

## 2014-08-21 DIAGNOSIS — N183 Chronic kidney disease, stage 3 (moderate): Secondary | ICD-10-CM | POA: Diagnosis not present

## 2014-08-21 LAB — HEMOGLOBIN A1C
Hgb A1c MFr Bld: 5 % (ref 4.8–5.6)
Mean Plasma Glucose: 97 mg/dL

## 2014-08-21 LAB — URINE CULTURE

## 2014-08-21 MED ORDER — METOPROLOL SUCCINATE ER 25 MG PO TB24
25.0000 mg | ORAL_TABLET | Freq: Every day | ORAL | Status: DC
  Administered 2014-08-22: 25 mg via ORAL
  Filled 2014-08-21 (×2): qty 1

## 2014-08-21 MED ORDER — AMLODIPINE BESYLATE 10 MG PO TABS
10.0000 mg | ORAL_TABLET | Freq: Every day | ORAL | Status: DC
  Administered 2014-08-21 – 2014-08-22 (×2): 10 mg via ORAL
  Filled 2014-08-21 (×2): qty 1

## 2014-08-21 MED ORDER — SODIUM CHLORIDE 0.45 % IV SOLN
INTRAVENOUS | Status: DC
  Administered 2014-08-21: 12:00:00 via INTRAVENOUS
  Administered 2014-08-22: 75 mL/h via INTRAVENOUS

## 2014-08-21 NOTE — H&P (Signed)
PATIENT NAME:  Leslie Houston, Leslie Houston MR#:  [id number] DATE OF BIRTH:  08/21/1950  DATE OF ADMISSION:  04/11/2014  PRIMARY CARE PHYSICIAN: Delia Chimes, NP in [place].  CHIEF COMPLAINT: Abdominal discomfort.   HISTORY OF PRESENT ILLNESS: This is a 64 year old female who thought she had a stomach virus. She had a lot of abdominal discomfort and pressure in her lower abdomen 2 days ago. She took some senna and took a woman's laxative, started having diarrhea today. She vomited on Thursday. She has been feeling sick to her stomach. She came into the ER for further evaluation, was found to be in acute renal failure with a creatinine of 5.92. Bladder scan showed greater than 999 mL in the bladder. A Foley catheter was placed and 1500 mL drained out. A CT scan showed symmetric bilateral mild to moderate dilatation of the ureters and extrarenal pelvis ease and bladder distention even though Foley was placed. Hospitalist services were contacted for further evaluation.   PAST MEDICAL HISTORY: CVA, congestive heart failure, heart disease.   PAST SURGICAL HISTORY: None.   ALLERGIES: No known drug allergies.   MEDICATIONS: The patient takes a blood pressure medication, but ran out 3 days ago; takes a medication for shingles, does not know what that is.   SOCIAL HISTORY: Positive smoker, 1/2 cigarette per day. No alcohol. No drug use. She used to do housework in the past, currently on disability.   FAMILY HISTORY: Father died of colon cancer, also had prostate cancer. Mother living, age 34, has a stroke and heart disease. Sister died of kidney failure, had diabetes.   REVIEW OF SYSTEMS: CONSTITUTIONAL: No fever, no chills, no sweats. No weight loss. No weight gain. No weakness or fatigue.  EYES: No blurry vision.  EARS, NOSE, MOUTH AND THROAT: No hearing loss. No sore throat. No difficulty swallowing.  CARDIOVASCULAR: No chest pain. No palpitations.  RESPIRATORY: Positive for shortness of breath. No  cough. No sputum. No hemoptysis.  GASTROINTESTINAL: No nausea. No vomiting. No abdominal pain. No diarrhea. No constipation. No bright red blood per rectum. No melena.  GENITOURINARY: No burning on urination or hematuria.  MUSCULOSKELETAL: No joint pain or muscle pain.  INTEGUMENT: No rashes or eruptions.  NEUROLOGIC: No fainting or blackouts.  PSYCHIATRIC: No anxiety or depression.  ENDOCRINE: No thyroid problems.  HEMATOLOGIC AND LYMPHATIC: No anemia. No easy bruising or bleeding.   PHYSICAL EXAMINATION:  VITAL SIGNS: Temperature 97.8, pulse 64, respirations 16, blood pressure 208/73, pulse oximetry 94% on room air.  GENERAL: No respiratory distress.  EYES: Conjunctivae and lids normal. Pupils equal, round and reactive to light. Extraocular muscles intact. No nystagmus.  EARS, NOSE, MOUTH AND THROAT: Tympanic membranes: No erythema. Nasal mucosa: No erythema. Throat: No erythema. No exudates seen. Lips and gums: No lesions.  NECK: No JVD. No bruits. No lymphadenopathy. No thyromegaly. No thyroid nodules palpated.  LUNGS: Clear to auscultation. No use of accessory muscles to breathe. No rhonchi, rales or wheeze heard.  CARDIOVASCULAR: S1, S2 normal. No gallops, rubs or murmurs heard. Carotid upstroke 2+ bilaterally. No bruits. Dorsalis pedis pulses 2+ bilaterally. No edema of the lower extremity.  ABDOMEN: Soft. After Foley catheter placed, nontender. No organomegaly/splenomegaly. Normoactive bowel sounds. No masses felt.  LYMPHATIC: No lymph nodes in the neck.  MUSCULOSKELETAL: No clubbing, edema, cyanosis.  SKIN: No rashes or ulcers seen. The patient does have some scabs on the lower extremity. NEUROLOGIC: Cranial nerves II-XII grossly intact. Deep tendon reflexes 2+ bilateral lower extremity.  PSYCHIATRIC: The patient  is alert, oriented to person, place and time.   LABORATORY AND RADIOLOGICAL DATA: CT scan of the abdomen and pelvis showed symmetric bilateral mild to moderate dilatation  of the ureters, an extra pelvic ease. Dilatation may be secondary to bladder distention. Urinalysis 1+ blood, 1+ leukocyte esterase. White blood cell count 3.5, H and H 10.7 and 32.9, platelet count of 151,000. Glucose 88, BUN 56, creatinine 5.92, sodium 143, potassium 5.1, chloride 115, CO2 of 18, calcium 9.9. Liver function tests normal range. Lipase 197.   ASSESSMENT AND PLAN:  1.  Acute renal failure, likely secondary to bladder outlet obstruction. The patient does have hydroureter, and mild hyperkalemia. Foley catheter placed and relieved the patient's lower abdominal pain. We will continue to monitor creatinine with IV fluid hydration. I will get a urology consultation for bladder outlet obstruction. Will need outpatient followup for this. Continue decompression with Foley catheter.  2.  Accelerated hypertension. The patient does not know her medications. Her pharmacy is closed, unable to contact for that. We will start Norvasc 5 mg stat and daily, and if blood pressure stays elevated then we will start metoprolol.  3.  Cystitis with hematuria. We will empirically give Rocephin, send off a urine culture.  4.  Anemia. We will continue to monitor.  5.  Tobacco abuse. The patient smokes a half cigarette per day. This can be quit easily. Smoking cessation counseling done 3 minutes by me. No need for nicotine patch.   TIME SPENT ON ADMISSION: Fifty-five minutes.    ____________________________ Tana Conch. [name], MD rjw:TT D: 04/11/2014 21:31:21 ET T: 04/11/2014 21:45:09 ET JOB#: [id number]  cc: Tana Conch. [name], MD, <Dictator> Delia Chimes, NP in [facility] MD ELECTRONICALLY SIGNED 04/21/2014 15:50

## 2014-08-21 NOTE — Consult Note (Signed)
Chief Complaint:  Subjective/Chief Complaint Feeling much better, ready for discharge.  Foley remains in place.  Renal ultrasound with minimal bilateral hydronephrosis.   VITAL SIGNS/ANCILLARY NOTES: **Vital Signs.:   16-Dec-15 09:00  Vital Signs Type Q 4hr  Temperature Temperature (F) 97.6  Celsius 36.4  Temperature Source oral  Pulse Pulse 61  Respirations Respirations 17  Systolic BP Systolic BP 401  Diastolic BP (mmHg) Diastolic BP (mmHg) 57  Mean BP 81  Pulse Ox % Pulse Ox % 99  Pulse Ox Activity Level  With exertion  Oxygen Delivery Room Air/ 21 %  *Intake and Output.:   Daily 16-Dec-15 07:00  Grand Totals Intake:  2046.65 Output:  6950    Net:  -4903.35 24 Hr.:  -4903.35  Oral Intake      In:  120  IV (Primary)      In:  1926.65  Urine ml     Out:  6950  Length of Stay Totals Intake:  6423.65 Output:  16400    Net:  -9976.35   Brief Assessment:  GEN well developed, well nourished, no acute distress   Cardiac Regular  -- LE edema   Respiratory normal resp effort   Gastrointestinal Normal  bladder nonpalpable   EXTR negative cyanosis/clubbing   Additional Physical Exam Foley catheter in place draining clear yellow urine   Lab Results: Routine Chem:  16-Dec-15 04:04   Glucose, Serum 84  BUN  22  Creatinine (comp)  2.51  Sodium, Serum 142  Potassium, Serum 4.1  Chloride, Serum  113  CO2, Serum 23  Calcium (Total), Serum 8.7  Anion Gap  6  Osmolality (calc) 286  eGFR (African American)  25  eGFR (Non-African American)  21 (eGFR values <50m/min/1.73 m2 may be an indication of chronic kidney disease (CKD). Calculated eGFR, using the MRDR Study equation, is useful in  patients with stable renal function. The eGFR calculation will not be reliable in acutely ill patients when serum creatinine is changing rapidly. It is not useful in patients on dialysis. The eGFR calculation may not be applicable to patients at the low and high extremes of body sizes,  pregnant women, and vegetarians.)  Routine Hem:  16-Dec-15 04:04   WBC (CBC) 3.8  RBC (CBC)  3.38  Hemoglobin (CBC)  10.6  Hematocrit (CBC)  32.7  Platelet Count (CBC) 175  MCV 97  MCH 31.5  MCHC 32.6  RDW 12.1  Neutrophil % 63.3  Lymphocyte % 24.9  Monocyte % 7.7  Eosinophil % 3.7  Basophil % 0.4  Neutrophil # 2.4  Lymphocyte # 1.0  Monocyte # 0.3  Eosinophil # 0.1  Basophil # 0.0 (Result(s) reported on 14 Apr 2014 at 04:52AM.)   Assessment/Plan:  Invasive Device Daily Assessment of Necessity:  Does the patient currently have any of the following indwelling devices? foley   Indwelling Urinary Catheter continued, requirement due to   Reason to continue Indwelling Urinary Catheter renal failure   Assessment/Plan:  Assessment 64year old female admitted with acute renal failure, moderate bilateral hydronephrosis, massive urinary retention with 1.5 L status post Foley catheter placement.  Her creatinine continues to trend down to 2.5 today, very high urine output consistent with postobstructive diuresis.  Underlying etiology of retention unclear, no evidence of infection, suspect neurogenic bladder.  Follow up renal ultrasound with resolution of hydronephrosis.   Plan -continue to trend creatinine, monitor electrolytes -Consider fluid replacement for postobstructive diuresis -Maintain Foley catheter ??1 week to decompress system -Will likely need outpatient urodynamics  evaluate bladder function -will need outpatient voiding trial next week in the office (please call [place] [phone] to arrange) -discharge per primary team   Electronic Signatures: Sherlynn Stalls (MD)  (Signed 16-Dec-15 09:53)  Authored: Chief Complaint, VITAL SIGNS/ANCILLARY NOTES, Brief Assessment, Lab Results, Assessment/Plan   Last Updated: 16-Dec-15 09:53 by Sherlynn Stalls (MD)

## 2014-08-21 NOTE — Progress Notes (Signed)
[facility] Progress Note   Leslie Houston [id number] DOB: 1950-10-28 DOA: 08/19/2014 PCP: Rico Sheehan  Brief narrative: Leslie Houston is a 64 y.o. female history of recent stroke in February 2016 with residual right-sided visual defect and difficulty speaking, history of hypertension hyperlipidemia who presented to the hospital for dizziness on standing. CT of the head has been negative. He continues to be dizzy and had a near syncopal event today and stood up at the bedside to do orthostatic vitals.   Subjective: Does not feel dizzy when sitting in bed. I have gotten her out of bed and ambulated her in the room out to the hall. She is not dizzy but does feel weak. Called later by RN that she is feeling dizzy again.   Assessment/Plan: Principal Problem:   Dizziness- near syncope on 4/22 when standing -Orthostatic vitals are negative but blood pressure did drop in the middle of the day likely secondary to antihypertensives-I did start some fluids for her yesterday to correct any dehydration that might be present. - dizziness clear had resolved when I ambulated her this AM but apparently recurred  - she states she is too claustrophobic to have an MRI -  CTA of the head and neck reveals 50-60% bilateral carotid stenosis- have discussed with vascular surgery, Dr Bridgett Larsson who does not feel that further work up is needed -Obtain echo today - symptoms not consistent with vertigo  Active Problems:   Urinary retention -Foley catheter placed in the ER apparently because she was unable to void (per patient) but there is no documentation of this-we'll remove Foley and do voiding trial today    Essential hypertension - BP noted to drop late yesterday- have cut back on antihypertensives -holding clonidine, Imdur were and hydralazine- continue Norvasc and metoprolol follow BP today   History of stroke - cont baby aspirin    CKD (chronic kidney disease) stage 3, GFR 30-59 ml/min -  stable   Appt with PCP: Code Status: full code Family Communication:  Disposition Plan: home when stable  DVT prophylaxis: Lovenox Consultants: Procedures:  Antibiotics: Anti-infectives    None      Objective: Filed Weights   08/19/14 1725  Weight: 78.926 kg (174 lb)    Intake/Output Summary (Last 24 hours) at 08/21/14 1304 Last data filed at 08/21/14 0616  Gross per 24 hour  Intake      0 ml  Output   1450 ml  Net  -1450 ml     Vitals Filed Vitals:   08/21/14 0103 08/21/14 0605 08/21/14 0829 08/21/14 1044  BP: 104/58 114/50 146/61 165/55  Pulse:  49 51 70  Temp:  97.5 F (36.4 C) 97.3 F (36.3 C) 97.4 F (36.3 C)  TempSrc:  Oral Oral Oral  Resp:  18 20 20   Height:      Weight:      SpO2:  100% 100% 100%    Exam:  General:  Pt is alert, not in acute distress  HEENT: No icterus, No thrush  Cardiovascular: regular rate and rhythm, S1/S2 No murmur  Respiratory: clear to auscultation bilaterally   Abdomen: Soft, +Bowel sounds, non tender, non distended, no guarding  MSK: No LE edema, cyanosis or clubbing  Data Reviewed: Basic Metabolic Panel:  Recent Labs Lab 08/17/14 1846 08/18/14 1150 08/18/14 1823 08/19/14 1822 08/20/14 0539  NA 140 140 139 139 139  K 3.8 5.8* 3.9 3.7 3.6  CL 104 105 106 108 108  CO2 27 26 22 23 24   GLUCOSE  81 96 74 90 88  BUN 14 14 11 14 12   CREATININE 1.48* 1.47* 1.35* 1.28* 1.17*  CALCIUM 9.9 9.7 9.6 10.2 9.8   Liver Function Tests:  Recent Labs Lab 08/17/14 1846 08/18/14 1150 08/19/14 1822 08/20/14 0539  AST 18 50* 21 16  ALT 19 26 17 16   ALKPHOS 57 56 59 57  BILITOT 0.7 1.7* 0.8 0.8  PROT 7.3 7.1 7.9 7.5  ALBUMIN 4.1 4.1 4.3 4.3    Recent Labs Lab 08/18/14 1150  LIPASE 24   No results for input(s): AMMONIA in the last 168 hours. CBC:  Recent Labs Lab 08/17/14 1846 08/18/14 1150 08/19/14 1822 08/20/14 0539  WBC 4.0 3.0* 5.0 3.5*  NEUTROABS 2.1 1.8 3.5  --   HGB 13.2 13.0 14.3 13.9  HCT  39.7 40.7 42.8 43.5  MCV 95.2 95.1 96.0 96.0  PLT 205 190 197 188   Cardiac Enzymes:  Recent Labs Lab 08/17/14 1846 08/19/14 1822  TROPONINI <0.03 <0.03   BNP (last 3 results) No results for input(s): BNP in the last 8760 hours.  ProBNP (last 3 results) No results for input(s): PROBNP in the last 8760 hours.  CBG: No results for input(s): GLUCAP in the last 168 hours.  No results found for this or any previous visit (from the past 240 hour(s)).   Studies:  Recent x-ray studies have been reviewed in detail by the Attending Physician  Scheduled Meds:  Scheduled Meds: . amLODipine  10 mg Oral Daily  . aspirin EC  81 mg Oral Daily  . enoxaparin (LOVENOX) injection  40 mg Subcutaneous QHS  . loratadine  10 mg Oral Daily  . LORazepam  0.5 mg Intravenous Once  . metoprolol succinate  25 mg Oral QHS  . pravastatin  40 mg Oral q1800  . sodium chloride  3 mL Intravenous Q12H   Continuous Infusions: . sodium chloride 75 mL/hr at 08/21/14 1218    Time spent on care of this patient: 27 min  [place], MD 08/21/2014, 1:04 PM    [facility] Office  [phone] Pager - Text Page per [email or web address]  If 7PM-7AM, please contact night-coverage [email or web address]

## 2014-08-21 NOTE — Consult Note (Signed)
Chief Complaint:  Subjective/Chief Complaint no complaints this morning.  Urine output via Foley high, 6 L.   VITAL SIGNS/ANCILLARY NOTES: **Vital Signs.:   15-Dec-15 04:49  Vital Signs Type Q 4hr  Temperature Temperature (F) 98.4  Celsius 36.8  Temperature Source oral  Pulse Pulse 69  Respirations Respirations 18  Systolic BP Systolic BP 628  Diastolic BP (mmHg) Diastolic BP (mmHg) 78  Mean BP 115  Pulse Ox % Pulse Ox % 97  Pulse Ox Activity Level  At rest  Oxygen Delivery Room Air/ 21 %  *Intake and Output.:   Daily 15-Dec-15 07:00  Grand Totals Intake:  3477 Output:  6000    Net:  -2523 24 Hr.:  -2523  Oral Intake      In:  1280  IV (Primary)      In:  2147  IV (Secondary)      In:  50  Urine ml     Out:  6000  Length of Stay Totals Intake:  4377 Output:  9450    Net:  -5073   Brief Assessment:  GEN well developed, well nourished, no acute distress   Cardiac Regular  -- LE edema   Respiratory normal resp effort   Gastrointestinal Normal  bladder nonpalpable   EXTR negative cyanosis/clubbing   Additional Physical Exam Foley catheter in place draining clear yellow urine   Lab Results: Routine Chem:  15-Dec-15 04:05   Glucose, Serum 90  BUN  29  Creatinine (comp)  3.30  Sodium, Serum 143  Potassium, Serum 4.8  Chloride, Serum  115  CO2, Serum  20  Calcium (Total), Serum 9.1  Anion Gap 8  Osmolality (calc) 290  eGFR (African American)  18  eGFR (Non-African American)  15 (eGFR values <56m/min/1.73 m2 may be an indication of chronic kidney disease (CKD). Calculated eGFR, using the MRDR Study equation, is useful in  patients with stable renal function. The eGFR calculation will not be reliable in acutely ill patients when serum creatinine is changing rapidly. It is not useful in patients on dialysis. The eGFR calculation may not be applicable to patients at the low and high extremes of body sizes, pregnant women, and vegetarians.)  Result Comment  POTASSIUM/CREATININE/BUN - Slight hemolysis, interpret results with  - caution.  Result(s) reported on 13 Apr 2014 at 05:07AM.  Routine Hem:  15-Dec-15 04:05   WBC (CBC) 4.6  RBC (CBC)  3.24  Hemoglobin (CBC)  10.3  Hematocrit (CBC)  31.8  Platelet Count (CBC)  149  MCV 98  MCH 31.7  MCHC 32.3  RDW 12.7  Neutrophil % 65.0  Lymphocyte % 24.0  Monocyte % 8.5  Eosinophil % 2.0  Basophil % 0.5  Neutrophil # 3.0  Lymphocyte # 1.1  Monocyte # 0.4  Eosinophil # 0.1  Basophil # 0.0 (Result(s) reported on 13 Apr 2014 at 05:07AM.)   Assessment/Plan:  Invasive Device Daily Assessment of Necessity:  Does the patient currently have any of the following indwelling devices? foley   Indwelling Urinary Catheter continued, requirement due to   Reason to continue Indwelling Urinary Catheter renal failure   Assessment/Plan:  Assessment 64year old female admitted with acute renal failure, moderate bilateral hydronephrosis, massive urinary retention with 1.5 L status post Foley catheter placement.  Her creatinine continues to trend down to 3.3 today, very high urine output consistent with postobstructive diuresis.  Underlying etiology of retention unclear, no evidence of infection, suspect neurogenic bladder.   Plan -continue to trend creatinine, monitor electrolytes -  Consider fluid replacement for postobstructive diuresis -recommend repeat renal ultrasound today to ensure resolution of hydronephrosis -Maintain Foley catheter ??1 week to decompress system -Will likely need outpatient urodynamics evaluate bladder function   Electronic Signatures: Sherlynn Stalls (MD)  (Signed 15-Dec-15 08:43)  Authored: Chief Complaint, VITAL SIGNS/ANCILLARY NOTES, Brief Assessment, Lab Results, Assessment/Plan   Last Updated: 15-Dec-15 08:43 by Sherlynn Stalls (MD)

## 2014-08-21 NOTE — Progress Notes (Signed)
Utilization Review completed.  

## 2014-08-22 DIAGNOSIS — R42 Dizziness and giddiness: Secondary | ICD-10-CM | POA: Diagnosis not present

## 2014-08-22 DIAGNOSIS — N183 Chronic kidney disease, stage 3 (moderate): Secondary | ICD-10-CM | POA: Diagnosis not present

## 2014-08-22 DIAGNOSIS — I10 Essential (primary) hypertension: Secondary | ICD-10-CM | POA: Diagnosis not present

## 2014-08-22 DIAGNOSIS — R339 Retention of urine, unspecified: Secondary | ICD-10-CM | POA: Diagnosis not present

## 2014-08-22 DIAGNOSIS — R55 Syncope and collapse: Secondary | ICD-10-CM | POA: Diagnosis not present

## 2014-08-22 MED ORDER — HYDRALAZINE HCL 10 MG PO TABS: 50.0000 mg | ORAL_TABLET | Freq: Three times a day (TID) | ORAL | Status: DC

## 2014-08-22 MED ORDER — HYDRALAZINE HCL 10 MG PO TABS: 10.0000 mg | ORAL_TABLET | Freq: Three times a day (TID) | ORAL | Status: DC

## 2014-08-22 NOTE — Progress Notes (Signed)
  Echocardiogram 2D Echocardiogram has been performed.  Lysle Rubens 08/22/2014, 1:34 PM

## 2014-08-22 NOTE — Discharge Summary (Signed)
Physician Discharge Summary  Leslie Houston [id number] DOB: [birth year] DOA: 08/19/2014  PCP: Rico Sheehan  Admit date: 08/19/2014 Discharge date: 08/22/2014  Time spent: 50 minutes  Recommendations for Outpatient Follow-up:  1. F/u BP and adjust medications as needed  Discharge Condition: stable Diet recommendation: low sodium, heart healthy  Discharge Diagnoses:  Principal Problem:   Dizziness/ near syncope Active Problems:   Hypotension with h/o Essential hypertension   History of stroke   CKD (chronic kidney disease) stage 3, GFR 30-59 ml/min   Urinary retention   History of present illness:  Leslie Houston is a 64 y.o. female history of recent stroke in February 2016 with residual right-sided visual defect and difficulty speaking, history of hypertension hyperlipidemia who presented to the hospital for dizziness on standing. CT of the head has been negative. She continues to be dizzy and had a near syncopal event in the hospital when she stood up at the bedside to do orthostatic vitals.   Hospital Course:  Principal Problem:  Dizziness- near syncope on 4/22 when standing - symptoms not consistent with vertigo -likely secondary to BP dropping in the daytime- noted to drop to SBP of 80s- antihypertensives adjusted and IVF given to treat any underlying dehydration which may be causing orthostasis and symptoms have resolved - for the past 24 hrs she has not had any further dizziness or near syncope with standing ans has been able to ambulate in the hall. - prior to noted above drop in BP further work up with a CTA of the head and neck was done (she refused and MRI) this reveals 50-60% bilateral carotid stenosis- have discussed with vascular surgery, Dr Bridgett Larsson who does not feel that further work up is needed -ECHO obtained as well as part of work up- this reveals G2 diastolic dysfunction- see report below    Active Problems:  Urinary retention -Foley catheter placed in  the ER apparently because she was unable to void (per patient) but there is no documentation of this-we'll removed - she did well and was able to urinate -she states she is seeing a urologist for issues with her bladder not emptying properly    Essential hypertension - BP noted to drop in mid afternoon to 79/46 on 4/22 which persisted through out the day  - see above- have cut back on antihypertensives which has resulted in a BP which is stable at 128/51 this afternoon -will be holding clonidine, and Norvasc on discharge -will decrease Hydralazine to 10 mg TID and resume it along with Imdur and Toprol  History of stroke - cont baby aspirin   CKD (chronic kidney disease) stage 3, GFR 30-59 ml/min - stable   Procedures:  2D ECHO  Left ventricle: The cavity size was normal. Wall thickness was normal. Systolic function was normal. The estimated ejection fraction was in the range of 55% to 60%. Wall motion was normal; there were no regional wall motion abnormalities. Features are consistent with a pseudonormal left ventricular filling pattern, with concomitant abnormal relaxation and increased filling pressure (grade 2 diastolic dysfunction). - Left atrium: The atrium was mildly to moderately dilated.  Consultations:  none  Discharge Exam: Filed Weights   08/19/14 1725  Weight: 78.926 kg (174 lb)   Filed Vitals:   08/22/14 1429  BP: 128/51  Pulse: 56  Temp: 97.3 F (36.3 C)  Resp: 18    General: AAO x 3, no distress Cardiovascular: RRR, no murmurs  Respiratory: clear to auscultation bilaterally GI: soft, non-tender, non-distended, bowel sound  positive  Discharge Instructions You were cared for by a hospitalist during your hospital stay. If you have any questions about your discharge medications or the care you received while you were in the hospital after you are discharged, you can call the unit and asked to speak with the hospitalist on call if the  hospitalist that took care of you is not available. Once you are discharged, your primary care physician will handle any further medical issues. Please note that NO REFILLS for any discharge medications will be authorized once you are discharged, as it is imperative that you return to your primary care physician (or establish a relationship with a primary care physician if you do not have one) for your aftercare needs so that they can reassess your need for medications and monitor your lab values.      Discharge Instructions    Diet - low sodium heart healthy    Complete by:  As directed      Increase activity slowly    Complete by:  As directed             Medication List    STOP taking these medications        amLODipine 10 MG tablet  Commonly known as:  NORVASC     cloNIDine 0.2 MG tablet  Commonly known as:  CATAPRES      TAKE these medications        aspirin EC 81 MG tablet  Take 81 mg by mouth daily.     fluticasone 50 MCG/ACT nasal spray  Commonly known as:  FLONASE  Place 2 sprays into both nostrils daily as needed for allergies (allergies).     hydrALAZINE 10 MG tablet  Commonly known as:  APRESOLINE  Take 1 tablet (10 mg total) by mouth 3 (three) times daily.     isosorbide mononitrate 30 MG 24 hr tablet  Commonly known as:  IMDUR  Take 30 mg by mouth daily.     loratadine 10 MG tablet  Commonly known as:  CLARITIN  Take 10 mg by mouth daily.     lovastatin 40 MG tablet  Commonly known as:  MEVACOR  Take 40 mg by mouth at bedtime.     metoprolol succinate 25 MG 24 hr tablet  Commonly known as:  TOPROL-XL  Take 25 mg by mouth at bedtime.     ondansetron 4 MG tablet  Commonly known as:  ZOFRAN  Take 4 mg by mouth every 8 (eight) hours as needed for nausea or vomiting (nausea).       No Known Allergies Follow-up Information    Follow up with [name],[name] In 1 week.   Specialty:  Internal Medicine   Why:  to have blood pressure checked        The results of significant diagnostics from this hospitalization (including imaging, microbiology, ancillary and laboratory) are listed below for reference.    Significant Diagnostic Studies: Ct Angio Head W/cm &/or Wo Cm  08/20/2014   CLINICAL DATA:  Left PCA infarct 2 months ago. New onset dizziness and presyncopal symptoms upon standing.  EXAM: CT ANGIOGRAPHY HEAD AND NECK  TECHNIQUE: Multidetector CT imaging of the head and neck was performed using the standard protocol during bolus administration of intravenous contrast. Multiplanar CT image reconstructions and MIPs were obtained to evaluate the vascular anatomy. Carotid stenosis measurements (when applicable) are obtained utilizing NASCET criteria, using the distal internal carotid diameter as the denominator.  CONTRAST:  172mL OMNIPAQUE IOHEXOL  350 MG/ML SOLN  COMPARISON:  None.  CT head without contrast 08/19/2014. MRI brain without contrast 06/22/2014.  FINDINGS: CT HEAD  Brain: The remote left PCA territory infarct is again seen. No acute infarct, hemorrhage, or mass lesion is present. The basal ganglia are intact. Gray-white differentiation is otherwise preserved.  Calvarium and skull base: Negative  Paranasal sinuses: Clear  Orbits: Within normal limits.  CTA NECK  Aortic arch: A common origin of the left common carotid artery and innominate artery are noted.  Right carotid system: The right common carotid artery is within normal limits. Dense atherosclerotic calcifications are present at the aortic arch. Calcified and noncalcified plaque narrows the proximal right internal carotid artery to approximately 50% of the distal lumen. The cervical left ICA scratch the cervical right ICA is tortuous without additional stenoses.  Left carotid system: A left common carotid artery is within normal limits. Calcified and noncalcified plaque narrow the lumen of the proximal left internal carotid artery to 1.9 mm. This compares with 4.1 mm distally. There  is mild tortuosity of the cervical left ICA otherwise without additional stenoses.  Vertebral arteries:The vertebral arteries originate from the subclavian arteries bilaterally. The right vertebral artery is the dominant vessel. There are no significant stenoses in the neck scratch the there are no significant vertebral artery stenoses in the neck.  Skeleton: There straightening of the normal cervical lordosis. Mild degenerative changes are present at each level from C2-3 through C6-7. Mild osseous foraminal narrowing is evident. No focal lytic or blastic lesions are present.  Other neck: Incidental note is made of or foraminal lobe of the thyroid. No discrete lesions are present within the thyroid. There is no significant adenopathy. Salivary glands are within normal limits. No focal mucosal or submucosal lesions are present.  CTA HEAD  Anterior circulation: Minimal atherosclerotic calcifications are present within the cavernous internal carotid arteries bilaterally. There is no significant stenosis. The terminal ICA is normal. A1 and M1 segments are within normal limits. The anterior communicating artery is patent. MCA bifurcations are within normal limits. ACA and MCA branch vessels are normal.  Posterior circulation: The right vertebral artery is slightly dominant to the left. The PICA origins are visualized and normal bilaterally. The vertebrobasilar junction is within normal limits. The basilar artery is normal. Both posterior cerebral arteries originate from the basilar tip. The PCA branch vessels are unremarkable.  Venous sinuses: The dural sinuses are patent. The right transverse sinus is the dominant vessel. The left internal jugular vein and sigmoid sinus are hypoplastic. The straight sinus and deep cerebral veins are patent.  Anatomic variants: None.  Delayed phase:No pathologic enhancement is present.  IMPRESSION: 1. Stable appearance of two-month old left PCA territory infarct. 2. No evidence for acute  infarct. 3. 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 4. No significant of posterior circulation stenosis identified. 5. Mild spondylosis of the cervical spine.   Electronically Signed   By: San Morelle M.D.   On: 08/20/2014 17:53   Dg Chest 2 View  08/18/2014   CLINICAL DATA:  Epigastric pain.  Nausea and vomiting  EXAM: CHEST  2 VIEW  COMPARISON:  None.  FINDINGS: Mild bibasilar airspace disease most consistent with atelectasis. No definite pneumonia or effusion. Negative for heart failure.  IMPRESSION: Mild bibasilar atelectasis.   Electronically Signed   By: Franchot Gallo M.D.   On: 08/18/2014 12:33   Ct Angio Neck W/cm &/or Wo/cm  08/20/2014   CLINICAL DATA:  Left PCA infarct 2 months ago. New onset dizziness and presyncopal symptoms upon standing.  EXAM: CT ANGIOGRAPHY HEAD AND NECK  TECHNIQUE: Multidetector CT imaging of the head and neck was performed using the standard protocol during bolus administration of intravenous contrast. Multiplanar CT image reconstructions and MIPs were obtained to evaluate the vascular anatomy. Carotid stenosis measurements (when applicable) are obtained utilizing NASCET criteria, using the distal internal carotid diameter as the denominator.  CONTRAST:  174mL OMNIPAQUE IOHEXOL 350 MG/ML SOLN  COMPARISON:  None.  CT head without contrast 08/19/2014. MRI brain without contrast 06/22/2014.  FINDINGS: CT HEAD  Brain: The remote left PCA territory infarct is again seen. No acute infarct, hemorrhage, or mass lesion is present. The basal ganglia are intact. Gray-white differentiation is otherwise preserved.  Calvarium and skull base: Negative  Paranasal sinuses: Clear  Orbits: Within normal limits.  CTA NECK  Aortic arch: A common origin of the left common carotid artery and innominate artery are noted.  Right carotid system: The right common carotid artery is within normal limits. Dense atherosclerotic calcifications are present at the  aortic arch. Calcified and noncalcified plaque narrows the proximal right internal carotid artery to approximately 50% of the distal lumen. The cervical left ICA scratch the cervical right ICA is tortuous without additional stenoses.  Left carotid system: A left common carotid artery is within normal limits. Calcified and noncalcified plaque narrow the lumen of the proximal left internal carotid artery to 1.9 mm. This compares with 4.1 mm distally. There is mild tortuosity of the cervical left ICA otherwise without additional stenoses.  Vertebral arteries:The vertebral arteries originate from the subclavian arteries bilaterally. The right vertebral artery is the dominant vessel. There are no significant stenoses in the neck scratch the there are no significant vertebral artery stenoses in the neck.  Skeleton: There straightening of the normal cervical lordosis. Mild degenerative changes are present at each level from C2-3 through C6-7. Mild osseous foraminal narrowing is evident. No focal lytic or blastic lesions are present.  Other neck: Incidental note is made of or foraminal lobe of the thyroid. No discrete lesions are present within the thyroid. There is no significant adenopathy. Salivary glands are within normal limits. No focal mucosal or submucosal lesions are present.  CTA HEAD  Anterior circulation: Minimal atherosclerotic calcifications are present within the cavernous internal carotid arteries bilaterally. There is no significant stenosis. The terminal ICA is normal. A1 and M1 segments are within normal limits. The anterior communicating artery is patent. MCA bifurcations are within normal limits. ACA and MCA branch vessels are normal.  Posterior circulation: The right vertebral artery is slightly dominant to the left. The PICA origins are visualized and normal bilaterally. The vertebrobasilar junction is within normal limits. The basilar artery is normal. Both posterior cerebral arteries originate from  the basilar tip. The PCA branch vessels are unremarkable.  Venous sinuses: The dural sinuses are patent. The right transverse sinus is the dominant vessel. The left internal jugular vein and sigmoid sinus are hypoplastic. The straight sinus and deep cerebral veins are patent.  Anatomic variants: None.  Delayed phase:No pathologic enhancement is present.  IMPRESSION: 1. Stable appearance of two-month old left PCA territory infarct. 2. No evidence for acute infarct. 3. 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 4. No significant of posterior circulation stenosis identified. 5. Mild spondylosis of the cervical spine.   Electronically Signed   By: San Morelle M.D.   On: 08/20/2014 17:53   Ct  Abdomen Pelvis W Contrast  08/18/2014   CLINICAL DATA:  64 year old female with epigastric abdominal pain for 3 days with nausea and vomiting. Initial encounter.  EXAM: CT ABDOMEN AND PELVIS WITH CONTRAST  TECHNIQUE: Multidetector CT imaging of the abdomen and pelvis was performed using the standard protocol following bolus administration of intravenous contrast.  CONTRAST:  18mL OMNIPAQUE IOHEXOL 300 MG/ML  SOLN  COMPARISON:  CT Abdomen and Pelvis 04/11/2014.  FINDINGS: Cardiomegaly. No pericardial or pleural effusion. Improved ventilation at the lung bases with less ground-glass and mosaic attenuation. Mild dependent atelectasis now.  Degenerative changes in the spine. No acute osseous abnormality identified.  No pelvic free fluid.  Negative rectum.  Negative uterus and adnexa.  Severe bladder distension. Estimated bladder volume 588 mL. No bladder wall thickening or perivesical stranding.  Negative sigmoid colon. Decompressed and negative left colon. Negative transverse colon with gas and stool. Redundant hepatic flexure. Stool and oral contrast in the right colon. Normal appendix. Negative terminal ileum. No dilated small bowel. Decompressed stomach and duodenum.  Liver, gallbladder,  spleen, pancreas and adrenal glands are within normal limits. The portal venous system is patent. Aortoiliac calcified atherosclerosis noted. Major arterial structures are patent. No abdominal free fluid.  Chronic hydronephrosis and extra renal pelvis. Symmetric renal contrast enhancement and excretion. Bilateral hydroureter without periureteral stranding or urologic calculus. No lymphadenopathy.  IMPRESSION: 1. Severe bladder distension (estimated bladder volume 588 mL) is felt responsible for bilateral hydroureter and hydronephrosis. Query bladder outlet obstruction. 2. No other acute or inflammatory process in the abdomen or pelvis. Normal appendix.   Electronically Signed   By: Genevie Ann M.D.   On: 08/18/2014 16:00   Mr Attempted Daymon Larsen Report  08/20/2014   This examination belongs to an outside facility and is stored  here for comparison purposes only.  Contact the originating outside  institution for any associated report or interpretation.  Ct Head Limited W/o Cm  08/19/2014   CLINICAL DATA:  Dizzy spells for 2 weeks. Presyncope. Recent cerebral infarction.  EXAM: CT HEAD WITHOUT CONTRAST  TECHNIQUE: Contiguous axial images were obtained from the base of the skull through the vertex without intravenous contrast.  COMPARISON:  MR brain 06/22/2014. CT head 06/22/2014. Most recent CT 07/24/2014.  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 No new areas of cerebral infarction are definitely identified.  No hemorrhage, mass lesion, hydrocephalus, or extra-axial fluid. Mild atrophy and small vessel disease. Calvarium intact. No sinus or mastoid disease. No change from March scan.  IMPRESSION: Chronic LEFT occipital infarct.  No acute features are evident.   Electronically Signed   By: Rolla Flatten M.D.   On: 08/19/2014 13:39    Microbiology: No results found for this or any previous visit (from the past 240  hour(s)).   Labs: Basic Metabolic Panel:  Recent Labs Lab 08/17/14 1846 08/18/14 1150 08/18/14 1823 08/19/14 1822 08/20/14 0539  NA 140 140 139 139 139  K 3.8 5.8* 3.9 3.7 3.6  CL 104 105 106 108 108  CO2 27 26 22 23 24   GLUCOSE 81 96 74 90 88  BUN 14 14 11 14 12   CREATININE 1.48* 1.47* 1.35* 1.28* 1.17*  CALCIUM 9.9 9.7 9.6 10.2 9.8   Liver Function Tests:  Recent Labs Lab 08/17/14 1846 08/18/14 1150 08/19/14 1822 08/20/14 0539  AST 18 50* 21 16  ALT 19 26 17 16   ALKPHOS 57 56 59 57  BILITOT 0.7 1.7* 0.8 0.8  PROT 7.3 7.1 7.9 7.5  ALBUMIN 4.1 4.1 4.3 4.3    Recent Labs Lab 08/18/14 1150  LIPASE 24   No results for input(s): AMMONIA in the last 168 hours. CBC:  Recent Labs Lab 08/17/14 1846 08/18/14 1150 08/19/14 1822 08/20/14 0539  WBC 4.0 3.0* 5.0 3.5*  NEUTROABS 2.1 1.8 3.5  --   HGB 13.2 13.0 14.3 13.9  HCT 39.7 40.7 42.8 43.5  MCV 95.2 95.1 96.0 96.0  PLT 205 190 197 188   Cardiac Enzymes:  Recent Labs Lab 08/17/14 1846 08/19/14 1822  TROPONINI <0.03 <0.03   BNP: BNP (last 3 results) No results for input(s): BNP in the last 8760 hours.  ProBNP (last 3 results) No results for input(s): PROBNP in the last 8760 hours.  CBG: No results for input(s): GLUCAP in the last 168 hours.     SignedDebbe Odea, MD [facility] 08/22/2014, 3:37 PM

## 2014-08-22 NOTE — Progress Notes (Signed)
CARE MANAGEMENT NOTE 08/22/2014  Patient:  NATACIA, JUILLERAT   Account Number:  [id number]  Date Initiated:  08/22/2014  Documentation initiated by:  [facility]  Subjective/Objective Assessment:   dizziness     Action/Plan:   Anticipated DC Date:  08/22/2014   Anticipated DC Plan:  [place]  CM consult      [facility] Choice  HOME HEALTH   Choice offered to / List presented to:  C-1 Patient   DME arranged  Vassie Moselle      DME agency  [facility].   Status of service:  Completed, signed off Medicare Important Message given?   (If response is "NO", the following Medicare IM given date fields will be blank) Date Medicare IM given:   Medicare IM given by:   Date Additional Medicare IM given:   Additional Medicare IM given by:    Discharge Disposition:  [place]  Per UR Regulation:    If discussed at Long Length of Stay Meetings, dates discussed:    Comments:  08/22/2014 1620 NCM spoke to pt and provided HHA list. Offered choice for [facility]. Pt requested [facility] for HH. Requesting RW for home. Contacted [facility] for DME and HH for scheduled dc home today. Jonnie Finner RN CCM Case Mgmt [phone]

## 2014-08-22 NOTE — Progress Notes (Addendum)
Pt VSS. Pt IV removed. Reviewed d/c instructions at bedside w/ family present. Reinforced medication regimen to pt.  No further questions.  Pt states she is missing her earrings but cannot recall when they were removed, there is no documentation on earrings. Charge nurse notified.  At [facility] 2 hours prior to arrival at [facility], where CT was performed. Kizzie Ide, RN

## 2014-08-23 ENCOUNTER — Encounter (HOSPITAL_COMMUNITY): Payer: Self-pay | Admitting: Emergency Medicine

## 2014-08-23 ENCOUNTER — Observation Stay (HOSPITAL_COMMUNITY)
Admission: EM | Admit: 2014-08-23 | Discharge: 2014-08-26 | Disposition: A | Discharge status: Home / Self Care | Payer: Medicare Other | Attending: Internal Medicine | Admitting: Internal Medicine

## 2014-08-23 ENCOUNTER — Emergency Department (HOSPITAL_COMMUNITY)
Admission: EM | Admit: 2014-08-23 | Discharge: 2014-08-23 | Disposition: A | Discharge status: Home / Self Care | Payer: Medicare Other | Source: Home / Self Care | Attending: Emergency Medicine | Admitting: Emergency Medicine

## 2014-08-23 DIAGNOSIS — Z7951 Long term (current) use of inhaled steroids: Secondary | ICD-10-CM | POA: Insufficient documentation

## 2014-08-23 DIAGNOSIS — H6692 Otitis media, unspecified, left ear: Secondary | ICD-10-CM | POA: Insufficient documentation

## 2014-08-23 DIAGNOSIS — Z7982 Long term (current) use of aspirin: Secondary | ICD-10-CM | POA: Insufficient documentation

## 2014-08-23 DIAGNOSIS — N390 Urinary tract infection, site not specified: Secondary | ICD-10-CM | POA: Diagnosis not present

## 2014-08-23 DIAGNOSIS — I252 Old myocardial infarction: Secondary | ICD-10-CM | POA: Insufficient documentation

## 2014-08-23 DIAGNOSIS — I2510 Atherosclerotic heart disease of native coronary artery without angina pectoris: Secondary | ICD-10-CM | POA: Diagnosis not present

## 2014-08-23 DIAGNOSIS — E669 Obesity, unspecified: Secondary | ICD-10-CM | POA: Insufficient documentation

## 2014-08-23 DIAGNOSIS — R569 Unspecified convulsions: Secondary | ICD-10-CM | POA: Diagnosis not present

## 2014-08-23 DIAGNOSIS — E119 Type 2 diabetes mellitus without complications: Secondary | ICD-10-CM

## 2014-08-23 DIAGNOSIS — Z79899 Other long term (current) drug therapy: Secondary | ICD-10-CM | POA: Insufficient documentation

## 2014-08-23 DIAGNOSIS — I509 Heart failure, unspecified: Secondary | ICD-10-CM

## 2014-08-23 DIAGNOSIS — Z8711 Personal history of peptic ulcer disease: Secondary | ICD-10-CM | POA: Diagnosis not present

## 2014-08-23 DIAGNOSIS — Z794 Long term (current) use of insulin: Secondary | ICD-10-CM | POA: Diagnosis not present

## 2014-08-23 DIAGNOSIS — R001 Bradycardia, unspecified: Principal | ICD-10-CM | POA: Insufficient documentation

## 2014-08-23 DIAGNOSIS — R42 Dizziness and giddiness: Secondary | ICD-10-CM | POA: Diagnosis not present

## 2014-08-23 DIAGNOSIS — Z8673 Personal history of transient ischemic attack (TIA), and cerebral infarction without residual deficits: Secondary | ICD-10-CM

## 2014-08-23 DIAGNOSIS — F419 Anxiety disorder, unspecified: Secondary | ICD-10-CM | POA: Diagnosis not present

## 2014-08-23 DIAGNOSIS — N179 Acute kidney failure, unspecified: Secondary | ICD-10-CM | POA: Insufficient documentation

## 2014-08-23 DIAGNOSIS — I10 Essential (primary) hypertension: Secondary | ICD-10-CM | POA: Diagnosis present

## 2014-08-23 DIAGNOSIS — K219 Gastro-esophageal reflux disease without esophagitis: Secondary | ICD-10-CM | POA: Diagnosis not present

## 2014-08-23 DIAGNOSIS — Z87891 Personal history of nicotine dependence: Secondary | ICD-10-CM

## 2014-08-23 DIAGNOSIS — I129 Hypertensive chronic kidney disease with stage 1 through stage 4 chronic kidney disease, or unspecified chronic kidney disease: Secondary | ICD-10-CM | POA: Insufficient documentation

## 2014-08-23 DIAGNOSIS — R339 Retention of urine, unspecified: Secondary | ICD-10-CM | POA: Diagnosis not present

## 2014-08-23 DIAGNOSIS — N184 Chronic kidney disease, stage 4 (severe): Secondary | ICD-10-CM | POA: Diagnosis present

## 2014-08-23 DIAGNOSIS — I69351 Hemiplegia and hemiparesis following cerebral infarction affecting right dominant side: Secondary | ICD-10-CM | POA: Diagnosis not present

## 2014-08-23 DIAGNOSIS — Z6831 Body mass index (BMI) 31.0-31.9, adult: Secondary | ICD-10-CM | POA: Insufficient documentation

## 2014-08-23 DIAGNOSIS — R404 Transient alteration of awareness: Secondary | ICD-10-CM | POA: Diagnosis not present

## 2014-08-23 DIAGNOSIS — R51 Headache: Secondary | ICD-10-CM | POA: Diagnosis not present

## 2014-08-23 DIAGNOSIS — R0789 Other chest pain: Secondary | ICD-10-CM | POA: Diagnosis not present

## 2014-08-23 DIAGNOSIS — R55 Syncope and collapse: Secondary | ICD-10-CM

## 2014-08-23 DIAGNOSIS — N183 Chronic kidney disease, stage 3 unspecified: Secondary | ICD-10-CM | POA: Diagnosis present

## 2014-08-23 LAB — CBC WITH DIFFERENTIAL/PLATELET
BASOS PCT: 0 % (ref 0–1)
Basophils Absolute: 0 10*3/uL (ref 0.0–0.1)
EOS PCT: 1 % (ref 0–5)
Eosinophils Absolute: 0 10*3/uL (ref 0.0–0.7)
HEMATOCRIT: 41.2 % (ref 36.0–46.0)
Hemoglobin: 13.6 g/dL (ref 12.0–15.0)
LYMPHS ABS: 1 10*3/uL (ref 0.7–4.0)
Lymphocytes Relative: 27 % (ref 12–46)
MCH: 31.6 pg (ref 26.0–34.0)
MCHC: 33 g/dL (ref 30.0–36.0)
MCV: 95.6 fL (ref 78.0–100.0)
MONO ABS: 0.2 10*3/uL (ref 0.1–1.0)
Monocytes Relative: 6 % (ref 3–12)
NEUTROS ABS: 2.6 10*3/uL (ref 1.7–7.7)
NEUTROS PCT: 66 % (ref 43–77)
Platelets: 186 10*3/uL (ref 150–400)
RBC: 4.31 MIL/uL (ref 3.87–5.11)
RDW: 12.1 % (ref 11.5–15.5)
WBC: 3.8 10*3/uL — ABNORMAL LOW (ref 4.0–10.5)

## 2014-08-23 LAB — BASIC METABOLIC PANEL
Anion gap: 7 (ref 5–15)
BUN: 17 mg/dL (ref 6–23)
CHLORIDE: 111 mmol/L (ref 96–112)
CO2: 24 mmol/L (ref 19–32)
Calcium: 10 mg/dL (ref 8.4–10.5)
Creatinine, Ser: 1.22 mg/dL — ABNORMAL HIGH (ref 0.50–1.10)
GFR, EST AFRICAN AMERICAN: 53 mL/min — AB (ref >90)
GFR, EST NON AFRICAN AMERICAN: 45 mL/min — AB (ref >90)
Glucose, Bld: 93 mg/dL (ref 70–99)
Potassium: 4.2 mmol/L (ref 3.5–5.1)
Sodium: 142 mmol/L (ref 135–145)

## 2014-08-23 LAB — CBG MONITORING, ED: Glucose-Capillary: 90 mg/dL (ref 70–99)

## 2014-08-23 NOTE — ED Notes (Signed)
Pt arrived via EMS with report of headache, dizziness and nausea. Pt reported that after taking a hot shower she felt sudden onset of dizziness and nausea and son called EMS. Speech clear, no facial drooping noted, bil equal hand grip, no pronation drifting, able to push/pull examiner's hand with feet. No numbness/tingling noted. Denies pain.

## 2014-08-23 NOTE — ED Provider Notes (Signed)
CSN: [id number]     Arrival date & time 08/23/14  1106 History   First MD Initiated Contact with Patient 08/23/14 1111     Chief Complaint  Patient presents with  . Dizziness     (Consider location/radiation/quality/duration/timing/severity/associated sxs/prior Treatment) HPI   Pt with hx stroke, DM, CHF, MI  With recent hospitalization for near syncope and hypotension, d/c yesterday, presents today with episode of lightheadedness while in a hot shower this morning.  States that her son became worried and called EMS.  After she got out of the shower her symptoms resolved.  States she has some fullness/pressure in her right maxillary sinus and nasal congestion but otherwise denies any current symptoms.  Denies dizziness (spinning), and current lightheadedness, headache, CP, SOB, focal neurologic deficits.  Has not taken her medications this morning and is aware of the changes that were made in her antihypertensives upon d/c from the hospital yesterday.    Past Medical History  Diagnosis Date  . Stroke   . CHF (congestive heart failure)   . Diabetes mellitus without complication   . MI (myocardial infarction)    Past Surgical History  Procedure Laterality Date  . Cesarean section     Family History  Problem Relation Age of Onset  . Stroke Mother   . Stroke Father   . Prostate cancer Father   . Diabetes Mellitus II Daughter    History  Substance Use Topics  . Smoking status: Former Smoker    Quit date: 03/30/2014  . Smokeless tobacco: Not on file  . Alcohol Use: No   OB History    No data available     Review of Systems  All other systems reviewed and are negative.     Allergies  Review of patient's allergies indicates no known allergies.  Home Medications   Prior to Admission medications   Medication Sig Start Date End Date Taking? Authorizing Provider  aspirin EC 81 MG tablet Take 81 mg by mouth daily.   Yes Historical Provider, MD  fluticasone (FLONASE) 50  MCG/ACT nasal spray Place 2 sprays into both nostrils daily as needed for allergies (allergies).    Yes Historical Provider, MD  hydrALAZINE (APRESOLINE) 10 MG tablet Take 1 tablet (10 mg total) by mouth 3 (three) times daily. 08/22/14  Yes Debbe Odea, MD  isosorbide mononitrate (IMDUR) 30 MG 24 hr tablet Take 30 mg by mouth daily.   Yes Historical Provider, MD  loratadine (CLARITIN) 10 MG tablet Take 10 mg by mouth daily.   Yes Historical Provider, MD  lovastatin (MEVACOR) 40 MG tablet Take 40 mg by mouth at bedtime.   Yes Historical Provider, MD  metoprolol succinate (TOPROL-XL) 25 MG 24 hr tablet Take 25 mg by mouth at bedtime.    Yes Historical Provider, MD  ondansetron (ZOFRAN) 4 MG tablet Take 4 mg by mouth every 8 (eight) hours as needed for nausea or vomiting (nausea).    Yes Historical Provider, MD   BP 133/58 mmHg  Pulse 54  Temp(Src) 98.4 F (36.9 C) (Oral)  Resp 18  SpO2 99% Physical Exam  Constitutional: She appears well-developed and well-nourished. No distress.  HENT:  Head: Normocephalic and atraumatic.  Neck: Neck supple.  Cardiovascular: Normal rate and regular rhythm.   Pulmonary/Chest: Effort normal and breath sounds normal. No respiratory distress. She has no wheezes. She has no rales.  Abdominal: Soft. She exhibits no distension. There is no tenderness. There is no rebound and no guarding.  Neurological: She  is alert.  CN II-XII intact, EOMs intact, no pronator drift, grip strengths equal bilaterally; strength 5/5 in all extremities, sensation intact in all extremities; finger to nose, heel to shin, rapid alternating movements normal.     Skin: She is not diaphoretic.  Nursing note and vitals reviewed.   ED Course  Procedures (including critical care time) Labs Review Labs Reviewed  BASIC METABOLIC PANEL - Abnormal; Notable for the following:    Creatinine, Ser 1.22 (*)    GFR calc non Af Amer 45 (*)    GFR calc Af Amer 53 (*)    All other components within  normal limits  CBC WITH DIFFERENTIAL/PLATELET - Abnormal; Notable for the following:    WBC 3.8 (*)    All other components within normal limits  CBG MONITORING, ED    Imaging Review No results found.   EKG Interpretation   Date/Time:  Monday August 23 2014 11:10:35 EDT Ventricular Rate:  49 PR Interval:  207 QRS Duration: 87 QT Interval:  451 QTC Calculation: 407 R Axis:   76 Text Interpretation:  Sinus bradycardia Probable LVH with secondary repol  abnrm Similar to prior Confirmed by Dina Rich  MD, Loma Sousa ([id number]) on  08/23/2014 12:14:20 PM       11:54 AM Discussed patient with Dr Tomi Bamberger.  Will order basic labs, EKG, continue to monitor patient for any symptoms.    MDM   Final diagnoses:  Lightheadedness    Afebrile, nontoxic patient with recent hospitalization for near syncope, found to be hypotensive due to antihypertensive medications.  Hypotension resolved with change of medications inpatient.  Pt had isolated episode of lightheadedness while in hot shower this morning, completely asymptomatic now.  No events on monitor, normotensive in department, ambulatory without difficulty in department.  Labs, EKG unremarkable.   D/C home with PCP follow up.  Discussed result, findings, treatment, and follow up  with patient.  Pt given return precautions.  Pt verbalizes understanding and agrees with plan.         Clayton Bibles, PA-C 08/23/14 [place], MD 08/26/14 [phone]

## 2014-08-23 NOTE — Discharge Instructions (Signed)
Read the information below.  You may return to the Emergency Department at any time for worsening condition or any new symptoms that concern you.  Please make sure to follow all instructions given to you yesterday when you were discharged from the hospital, including the change in your blood pressure medications.  Follow up with your primary care provider.  If you develop recurrent or worsening lightheadedness or you pass out, or develop any chest pain, shortness of breath, or stroke-like symptoms (difficulty walking or talking, numbness or weakness on one side of your body), call 911 or return to the Emergency Department immediately for a recheck.   Dizziness  Dizziness means you feel unsteady or lightheaded. You might feel like you are going to pass out (faint). HOME CARE   Drink enough fluids to keep your pee (urine) clear or pale yellow.  Take your medicines exactly as told by your doctor. If you take blood pressure medicine, always stand up slowly from the lying or sitting position. Hold on to something to steady yourself.  If you need to stand in one place for a long time, move your legs often. Tighten and relax your leg muscles.  Have someone stay with you until you feel okay.  Do not drive or use heavy machinery if you feel dizzy.  Do not drink alcohol. GET HELP RIGHT AWAY IF:   You feel dizzy or lightheaded and it gets worse.  You feel sick to your stomach (nauseous), or you throw up (vomit).  You have trouble talking or walking.  You feel weak or have trouble using your arms, hands, or legs.  You cannot think clearly or have trouble forming sentences.  You have chest pain, belly (abdominal) pain, sweating, or you are short of breath.  Your vision changes.  You are bleeding.  You have problems from your medicine that seem to be getting worse. MAKE SURE YOU:   Understand these instructions.  Will watch your condition.  Will get help right away if you are not doing  well or get worse. Document Released: 04/05/2011 Document Revised: 07/09/2011 Document Reviewed: 04/05/2011 [facility] Patient Information 2015 [place], [place]. This information is not intended to replace advice given to you by your health care provider. Make sure you discuss any questions you have with your health care provider.

## 2014-08-23 NOTE — ED Notes (Signed)
Pt alert and oriented x4. Respirations even and unlabored, bilateral symmetrical rise and fall of chest. Skin warm and dry. In no acute distress. Denies needs.   

## 2014-08-23 NOTE — ED Notes (Signed)
Pt was seen at [facility] earlier today and discharged home.  Pt's son st's when she got home she had a syncopal episode and he called 911 to bring pt back to hosp.  Pt alert and oriented at this time

## 2014-08-23 NOTE — ED Notes (Signed)
Bed: [id number] Expected date:  Expected time:  Means of arrival:  Comments: EMS- headache

## 2014-08-23 NOTE — ED Notes (Signed)
Pt escorted to discharge window. Pt verbalized understanding discharge instructions. In no acute distress.  

## 2014-08-23 NOTE — ED Notes (Signed)
Called to see patient, family stated that patient was having a seizure. Pt is alert and oriented x4. Equal grips strengths, sitting upright in wheelchair.

## 2014-08-24 ENCOUNTER — Observation Stay (HOSPITAL_COMMUNITY): Payer: Medicare Other

## 2014-08-24 DIAGNOSIS — N183 Chronic kidney disease, stage 3 (moderate): Secondary | ICD-10-CM

## 2014-08-24 DIAGNOSIS — R55 Syncope and collapse: Secondary | ICD-10-CM | POA: Insufficient documentation

## 2014-08-24 DIAGNOSIS — I10 Essential (primary) hypertension: Secondary | ICD-10-CM | POA: Diagnosis not present

## 2014-08-24 DIAGNOSIS — Z8673 Personal history of transient ischemic attack (TIA), and cerebral infarction without residual deficits: Secondary | ICD-10-CM

## 2014-08-24 DIAGNOSIS — J9811 Atelectasis: Secondary | ICD-10-CM | POA: Diagnosis not present

## 2014-08-24 DIAGNOSIS — R001 Bradycardia, unspecified: Secondary | ICD-10-CM

## 2014-08-24 LAB — CBC WITH DIFFERENTIAL/PLATELET
Basophils Absolute: 0 10*3/uL (ref 0.0–0.1)
Basophils Relative: 0 % (ref 0–1)
EOS ABS: 0.1 10*3/uL (ref 0.0–0.7)
Eosinophils Relative: 2 % (ref 0–5)
LYMPHS PCT: 34 % (ref 12–46)
Lymphs Abs: 1.3 10*3/uL (ref 0.7–4.0)
MONOS PCT: 9 % (ref 3–12)
Monocytes Absolute: 0.3 10*3/uL (ref 0.1–1.0)
Neutro Abs: 2.1 10*3/uL (ref 1.7–7.7)
Neutrophils Relative %: 55 % (ref 43–77)

## 2014-08-24 LAB — CBC
HCT: 42.9 % (ref 36.0–46.0)
HEMATOCRIT: 44.1 % (ref 36.0–46.0)
Hemoglobin: 14.7 g/dL (ref 12.0–15.0)
Hemoglobin: 14.3 g/dL (ref 12.0–15.0)
MCH: 31.3 pg (ref 26.0–34.0)
MCH: 31.6 pg (ref 26.0–34.0)
MCHC: 33.3 g/dL (ref 30.0–36.0)
MCHC: 33.3 g/dL (ref 30.0–36.0)
MCV: 94 fL (ref 78.0–100.0)
MCV: 94.7 fL (ref 78.0–100.0)
PLATELETS: 184 10*3/uL (ref 150–400)
Platelets: 173 10*3/uL (ref 150–400)
RBC: 4.69 MIL/uL (ref 3.87–5.11)
RBC: 4.53 MIL/uL (ref 3.87–5.11)
RDW: 12.1 % (ref 11.5–15.5)
RDW: 12.2 % (ref 11.5–15.5)
WBC: 4.1 10*3/uL (ref 4.0–10.5)
WBC: 3.9 10*3/uL — AB (ref 4.0–10.5)

## 2014-08-24 LAB — COMPREHENSIVE METABOLIC PANEL
ALBUMIN: 4 g/dL (ref 3.5–5.2)
ALBUMIN: 4.2 g/dL (ref 3.5–5.2)
ALT: 19 U/L (ref 0–35)
ALT: 21 U/L (ref 0–35)
AST: 23 U/L (ref 0–37)
AST: 26 U/L (ref 0–37)
Alkaline Phosphatase: 52 U/L (ref 39–117)
Alkaline Phosphatase: 53 U/L (ref 39–117)
Anion gap: 13 (ref 5–15)
Anion gap: 12 (ref 5–15)
BUN: 14 mg/dL (ref 6–23)
BUN: 13 mg/dL (ref 6–23)
CHLORIDE: 106 mmol/L (ref 96–112)
CO2: 21 mmol/L (ref 19–32)
CO2: 22 mmol/L (ref 19–32)
CREATININE: 1.28 mg/dL — AB (ref 0.50–1.10)
Calcium: 10.4 mg/dL (ref 8.4–10.5)
Calcium: 10.3 mg/dL (ref 8.4–10.5)
Chloride: 108 mmol/L (ref 96–112)
Creatinine, Ser: 1.25 mg/dL — ABNORMAL HIGH (ref 0.50–1.10)
GFR, EST AFRICAN AMERICAN: 50 mL/min — AB (ref >90)
GFR, EST AFRICAN AMERICAN: 51 mL/min — AB (ref >90)
GFR, EST NON AFRICAN AMERICAN: 43 mL/min — AB (ref >90)
GFR, EST NON AFRICAN AMERICAN: 44 mL/min — AB (ref >90)
GLUCOSE: 91 mg/dL (ref 70–99)
Glucose, Bld: 99 mg/dL (ref 70–99)
Potassium: 3.7 mmol/L (ref 3.5–5.1)
Potassium: 3.7 mmol/L (ref 3.5–5.1)
SODIUM: 140 mmol/L (ref 135–145)
Sodium: 142 mmol/L (ref 135–145)
TOTAL PROTEIN: 7.5 g/dL (ref 6.0–8.3)
Total Bilirubin: 0.8 mg/dL (ref 0.3–1.2)
Total Bilirubin: 0.5 mg/dL (ref 0.3–1.2)
Total Protein: 7.2 g/dL (ref 6.0–8.3)

## 2014-08-24 LAB — URINALYSIS, ROUTINE W REFLEX MICROSCOPIC
Bilirubin Urine: NEGATIVE
Glucose, UA: NEGATIVE mg/dL
Hgb urine dipstick: NEGATIVE
Ketones, ur: NEGATIVE mg/dL
Nitrite: NEGATIVE
PROTEIN: NEGATIVE mg/dL
SPECIFIC GRAVITY, URINE: 1.011 (ref 1.005–1.030)
Urobilinogen, UA: 0.2 mg/dL (ref 0.0–1.0)
pH: 6.5 (ref 5.0–8.0)

## 2014-08-24 LAB — GLUCOSE, CAPILLARY
GLUCOSE-CAPILLARY: 79 mg/dL (ref 70–99)
GLUCOSE-CAPILLARY: 90 mg/dL (ref 70–99)
GLUCOSE-CAPILLARY: 88 mg/dL (ref 70–99)
GLUCOSE-CAPILLARY: 102 mg/dL — AB (ref 70–99)
GLUCOSE-CAPILLARY: 110 mg/dL — AB (ref 70–99)

## 2014-08-24 LAB — TROPONIN I: Troponin I: 0.03 ng/mL (ref <0.031)

## 2014-08-24 LAB — URINE MICROSCOPIC-ADD ON

## 2014-08-24 LAB — CREATININE, SERUM
CREATININE: 1.25 mg/dL — AB (ref 0.50–1.10)
GFR calc Af Amer: 51 mL/min — ABNORMAL LOW (ref >90)
GFR calc non Af Amer: 44 mL/min — ABNORMAL LOW (ref >90)

## 2014-08-24 LAB — TSH: TSH: 1.351 u[IU]/mL (ref 0.350–4.500)

## 2014-08-24 MED ORDER — INSULIN ASPART 100 UNIT/ML ~~LOC~~ SOLN: 0.0000 [IU] | SUBCUTANEOUS | Status: DC

## 2014-08-24 MED ORDER — DEXTROSE 5 % IV SOLN
1.0000 g | INTRAVENOUS | Status: DC
  Administered 2014-08-25 – 2014-08-26 (×2): 1 g via INTRAVENOUS
  Filled 2014-08-24 (×3): qty 10

## 2014-08-24 MED ORDER — ENOXAPARIN SODIUM 30 MG/0.3ML ~~LOC~~ SOLN
30.0000 mg | SUBCUTANEOUS | Status: DC
Start: 2014-08-24 — End: 2014-08-26
  Administered 2014-08-25: 30 mg via SUBCUTANEOUS
  Filled 2014-08-24 (×3): qty 0.3

## 2014-08-24 MED ORDER — DEXTROSE 5 % IV SOLN
1.0000 g | Freq: Once | INTRAVENOUS | Status: AC
  Administered 2014-08-24: 1 g via INTRAVENOUS
  Filled 2014-08-24: qty 10

## 2014-08-24 MED ORDER — PRAVASTATIN SODIUM 40 MG PO TABS
40.0000 mg | ORAL_TABLET | Freq: Every day | ORAL | Status: DC
  Administered 2014-08-24 – 2014-08-25 (×2): 40 mg via ORAL
  Filled 2014-08-24 (×3): qty 1

## 2014-08-24 MED ORDER — ASPIRIN EC 81 MG PO TBEC
81.0000 mg | DELAYED_RELEASE_TABLET | Freq: Every day | ORAL | Status: DC
  Administered 2014-08-24 – 2014-08-26 (×3): 81 mg via ORAL
  Filled 2014-08-24 (×3): qty 1

## 2014-08-24 MED ORDER — SODIUM CHLORIDE 0.9 % IJ SOLN
3.0000 mL | Freq: Two times a day (BID) | INTRAMUSCULAR | Status: DC
  Administered 2014-08-24 – 2014-08-25 (×3): 3 mL via INTRAVENOUS

## 2014-08-24 MED ORDER — LORAZEPAM 2 MG/ML IJ SOLN: 1.0000 mg | Freq: Once | INTRAMUSCULAR | Status: DC

## 2014-08-24 MED ORDER — AMLODIPINE BESYLATE 10 MG PO TABS
10.0000 mg | ORAL_TABLET | Freq: Every day | ORAL | Status: DC
Start: 2014-08-24 — End: 2014-08-26
  Administered 2014-08-24 – 2014-08-26 (×3): 10 mg via ORAL
  Filled 2014-08-24 (×3): qty 1

## 2014-08-24 NOTE — Progress Notes (Signed)
PATIENT ARRIVED TO TELE UNIT FROM E.D. VIA STRETCHER. ASSISTED TO BED. TELE APPLIED. VITALS OBTAINED. ASSESSMENT PERFORMED.  PATIENT INSTRUCTED TO CALL FOR ASSISTANCE WHEN NEEDED.

## 2014-08-24 NOTE — Progress Notes (Signed)
PT Cancellation Note  Patient Details Name: Leslie Houston MRN: [id number] DOB: 04/19/1951   Cancelled Treatment:    Reason Eval/Treat Not Completed: Patient at procedure or test/unavailable. Pt off of the unit.  Will check back as schedule permits.   Andri Prestia LUBECK 08/24/2014, 9:56 AM

## 2014-08-24 NOTE — ED Notes (Signed)
Bladder scanner showed 398cc.

## 2014-08-24 NOTE — Consult Note (Signed)
NEURO HOSPITALIST CONSULT NOTE    Reason for Consult: Syncope versus Seizure.   HPI:                                                                                                                                          Leslie Houston is an 64 y.o. female brought to hospital after suffering recurrent episodes of syncope when standing or ambulating. patient has history of CVA back in February 2016 which has left her with a right upper quadrant field cut. During that hospitalization she was suffering dizzy spells when she stood up. patient was again admitted to hospital on 4/22 for dizziness. During that hospitalization her BP was noted to drop systolic when standing and resolved with hydration. 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 Patietn was discharged on 4/24.  Patient returned to hospital 4/25 after she felt dizzy and nauseated while taking a hot shower. Her son was worried and brought her to ED. She was discharged home but upon returning home her son stated she had a second syncopal episode. Per family--she had jerking during the syncopal episode. Patient has had multiple orthostatic BP while in hospital which showed no drop in BP. Her HR has remained brady cardic while in hospital. EEG has been obtained showing no abnormalities.  Pateint is a poor historian but states she will stand up and then feel as if the room is closing in, legs become weak, and she will fall to her knees. She tells me she has not lost consciousness but no one is in room to second this.    Son states she gets red in face and starts staring.  He he keep calling her name she will start to respond.  During event she was able to count fingers and state what day it is.  He states yesterday she she fell on the floor but she was able to respond to him with  nodding her head. When asked if she could hear him she nodded her head yes and then stuttered how many fingers he was holding up.   Past Medical History  Diagnosis Date  . Stroke   . CHF (congestive heart failure)   . Diabetes mellitus without complication   . MI (myocardial infarction)     Past Surgical History  Procedure Laterality Date  . Cesarean section      Family History  Problem Relation Age of Onset  . Stroke Mother   . Stroke Father   . Prostate cancer Father   . Diabetes Mellitus II Daughter      Social History:  reports that she quit smoking about 4  months ago. She does not have any smokeless tobacco history on file. She reports that she does not drink alcohol or use illicit drugs.  No Known Allergies  MEDICATIONS:                                                                                                                     Prior to Admission:  Prescriptions prior to admission  Medication Sig Dispense Refill Last Dose  . aspirin EC 81 MG tablet Take 81 mg by mouth daily.   08/23/2014 at Unknown time  . fluticasone (FLONASE) 50 MCG/ACT nasal spray Place 2 sprays into both nostrils daily as needed for allergies (allergies).    08/22/2014 at Unknown time  . hydrALAZINE (APRESOLINE) 10 MG tablet Take 1 tablet (10 mg total) by mouth 3 (three) times daily. 90 tablet 0 08/22/2014 at Unknown time  . isosorbide mononitrate (IMDUR) 30 MG 24 hr tablet Take 30 mg by mouth daily.   08/22/2014 at Unknown time  . loratadine (CLARITIN) 10 MG tablet Take 10 mg by mouth daily.   08/22/2014 at Unknown time  . lovastatin (MEVACOR) 40 MG tablet Take 40 mg by mouth at bedtime.   08/22/2014 at Unknown time  . metoprolol succinate (TOPROL-XL) 25 MG 24 hr tablet Take 25 mg by mouth at bedtime.    08/22/2014 at 1030 epm  . ondansetron (ZOFRAN) 4 MG tablet Take 4 mg by mouth every 8 (eight) hours as needed for nausea or vomiting (nausea).    08/22/2014 at Unknown time   Scheduled: .  amLODipine  10 mg Oral Daily  . aspirin EC  81 mg Oral Daily  . cefTRIAXone (ROCEPHIN)  IV  1 g Intravenous Q24H  . enoxaparin (LOVENOX) injection  30 mg Subcutaneous Q24H  . insulin aspart  0-9 Units Subcutaneous 6 times per day  . LORazepam  1 mg Intravenous Once  . pravastatin  40 mg Oral q1800  . sodium chloride  3 mL Intravenous Q12H     ROS:                                                                                                                                       History obtained from the patient  General ROS: negative for - chills, fatigue, fever, night sweats, weight gain or weight loss Psychological ROS: negative for - behavioral disorder, hallucinations, memory difficulties, mood swings or suicidal  ideation Ophthalmic ROS: negative for - blurry vision, double vision, eye pain or loss of vision ENT ROS: negative for - epistaxis, nasal discharge, oral lesions, sore throat, tinnitus or vertigo Allergy and Immunology ROS: negative for - hives or itchy/watery eyes Hematological and Lymphatic ROS: negative for - bleeding problems, bruising or swollen lymph nodes Endocrine ROS: negative for - galactorrhea, hair pattern changes, polydipsia/polyuria or temperature intolerance Respiratory ROS: negative for - cough, hemoptysis, shortness of breath or wheezing Cardiovascular ROS: negative for - chest pain, dyspnea on exertion, edema or irregular heartbeat Gastrointestinal ROS: negative for - abdominal pain, diarrhea, hematemesis, nausea/vomiting or stool incontinence Genito-Urinary ROS: negative for - dysuria, hematuria, incontinence or urinary frequency/urgency Musculoskeletal ROS: negative for - joint swelling or muscular weakness Neurological ROS: as noted in HPI Dermatological ROS: negative for rash and skin lesion changes   Blood pressure 179/67, pulse 60, temperature 97.7 F (36.5 C), temperature source Oral, resp. rate 18, height 5\' 1"  (1.549 m), weight 76.5 kg (168 lb  10.4 oz), SpO2 100 %.   Neurologic Examination:                                                                                                      HEENT-  Normocephalic, no lesions, without obvious abnormality.  Normal external eye and conjunctiva.  Normal TM's bilaterally.  Normal auditory canals and external ears. Normal external nose, mucus membranes and septum.  Normal pharynx. Cardiovascular- S1, S2 normal, pulses palpable throughout   Lungs- chest clear, no wheezing, rales, normal symmetric air entry Abdomen- normal findings: bowel sounds normal Extremities- no edema Lymph-no adenopathy palpable Musculoskeletal-no joint tenderness, deformity or swelling Skin-warm and dry, no hyperpigmentation, vitiligo, or suspicious lesions  Neurological Examination Mental Status: Alert, oriented, thought content appropriate.  Speech with stutter but fluent without evidence of aphasia.  Able to follow 3 step commands without difficulty. Cranial Nerves: II: Discs flat bilaterally; Visual fields shows a right upper quadrant field cut, pupils equal, round, reactive to light and accommodation III,IV, VI: ptosis not present, extra-ocular motions intact bilaterally V,VII: smile symmetric, facial light touch sensation normal bilaterally VIII: hearing normal bilaterally IX,X: uvula rises symmetrically XI: bilateral shoulder shrug XII: midline tongue extension Motor: Right : Upper extremity   5/5    Left:     Upper extremity   5/5  Lower extremity   5/5     Lower extremity   5/5 Tone and bulk:normal tone throughout; no atrophy noted Sensory: Pinprick and light touch intact throughout, bilaterally Deep Tendon Reflexes: 2+ and symmetric throughout Plantars: Right: downgoing   Left: downgoing Cerebellar: normal finger-to-nose,and normal heel-to-shin test Gait: not tested due to safety      Lab Results: Basic Metabolic Panel:  Recent Labs Lab 08/19/14 1822 08/20/14 0539 08/23/14 1213  08/24/14 0038 08/24/14 0424 08/24/14 0615  NA 139 139 142 142  --  140  K 3.7 3.6 4.2 3.7  --  3.7  CL 108 108 111 108  --  106  CO2 23 24 24 21   --  22  GLUCOSE 90 88 93  91  --  99  BUN 14 12 17 14   --  13  CREATININE 1.28* 1.17* 1.22* 1.28* 1.25* 1.25*  CALCIUM 10.2 9.8 10.0 10.4  --  10.3    Liver Function Tests:  Recent Labs Lab 08/18/14 1150 08/19/14 1822 08/20/14 0539 08/24/14 0038 08/24/14 0615  AST 50* 21 16 23 26   ALT 26 17 16 19 21   ALKPHOS 56 59 57 52 53  BILITOT 1.7* 0.8 0.8 0.8 0.5  PROT 7.1 7.9 7.5 7.5 7.2  ALBUMIN 4.1 4.3 4.3 4.2 4.0    Recent Labs Lab 08/18/14 1150  LIPASE 24   No results for input(s): AMMONIA in the last 168 hours.  CBC:  Recent Labs Lab 08/17/14 1846 08/18/14 1150 08/19/14 1822 08/20/14 0539 08/23/14 1213 08/24/14 0038 08/24/14 0424 08/24/14 0615  WBC 4.0 3.0* 5.0 3.5* 3.8* 4.1 3.9* DUPLICATE REQUEST  NEUTROABS 2.1 1.8 3.5  --  2.6  --   --  2.1  HGB 13.2 13.0 14.3 13.9 [id number] DUPLICATE REQUEST  HCT [id number] 40.7 42.8 43.5 [id number] Q000111Q 0000000 DUPLICATE REQUEST  MCV 123XX123 95.1 96.0 96.0 99991111 123456 [id number] DUPLICATE REQUEST  PLT 99991111 190 197 0000000 99991111 [id number] Q000111Q DUPLICATE REQUEST    Cardiac Enzymes:  Recent Labs Lab 08/17/14 1846 08/19/14 1822 08/24/14 0038  TROPONINI <0.03 <0.03 0.03    Lipid Panel: No results for input(s): CHOL, TRIG, HDL, CHOLHDL, VLDL, LDLCALC in the last 168 hours.  CBG:  Recent Labs Lab 08/23/14 1126 08/24/14 0606 08/24/14 0812  GLUCAP 90 79 90    Microbiology: Results for orders placed or performed in visit on 04/11/14  Urine culture     Status: None   Collection Time: 04/11/14  3:15 PM  Result Value Ref Range Status   Micro Text Report   Final       SOURCE: INDWELLING CATHETER    COMMENT                   MIXED BACTERIAL ORGANISMS   COMMENT                   RESULTS SUGGESTIVE OF CONTAMINATION   COMMENT                   SUGGEST RECOLLECTION   ANTIBIOTIC                                                       Stool culture     Status: None   Collection Time: 04/12/14 10:18 PM  Result Value Ref Range Status   Micro Text Report   Final       COMMENT                   NO SALMONELLA OR SHIGELLA ISOLATED   COMMENT                   NO PATHOGENIC E.COLI DETECTED   COMMENT                   NO CAMPYLOBACTER ANTIGEN DETECTED   ANTIBIOTIC  Clostridium Difficile [facility])     Status: None   Collection Time: 04/12/14 10:18 PM  Result Value Ref Range Status   Micro Text Report   Final       C.DIFFICILE ANTIGEN       C.DIFFICILE GDH ANTIGEN : NEGATIVE   C.DIFFICILE TOXIN A/B     C.DIFFICILE TOXINS A AND B : NEGATIVE   INTERPRETATION            Negative for C. difficile.    ANTIBIOTIC                                                        Coagulation Studies: No results for input(s): LABPROT, INR in the last 72 hours.  Imaging: X-ray Chest Pa And Lateral  08/24/2014   CLINICAL DATA:  Acute onset of syncope.  Initial encounter.  EXAM: CHEST  2 VIEW  COMPARISON:  Chest radiograph performed 08/18/2014  FINDINGS: The lungs are well-aerated. Mild bibasilar atelectasis is noted. There is no evidence of pleural effusion or pneumothorax.  The heart is normal in size; the mediastinal contour is within normal limits. No acute osseous abnormalities are seen.  IMPRESSION: Mild bibasilar atelectasis is noted.  Lungs otherwise clear.   Electronically Signed   By: Garald Balding M.D.   On: 08/24/2014 05:11    Etta Quill PA-C [facility] [phone]  08/24/2014, 12:01 PM   Assessment/Plan: 64 YO female with multiple recurrent episodes of syncope/presyncopal events.  EEG obtained and showed no epileptiform activity.  Multiple orthostatic BP showed elevation of SBP while standing and no orthostasis. Etiology for syncopal spells remains unclear, but is unlikely of CNS origin.  Recommend: 1) MRI brain (ordered) 2)  Agree with cardiology to have 1 month event monitor.  3) No further neurodiagnostic studies are indicated if MRI of the brain is unremarkable.  I personally participated in this patient's evaluation and management, including formulating the above clinical impression and management recommendations.  Rush Farmer M.D. [facility] [phone]

## 2014-08-24 NOTE — Procedures (Signed)
EEG report.  Brief clinical history:  64 year old moderately female readmitted for witnessed loss of consciousness. She was recently discharged on 08/22/14 for similar episodes. She has a history of hypertension and a recent PCA stroke 2 months ago. A CT of her head and neck showed an old posterior circulation stroke without acute abnormalities performed within the last week, no evidence of posterior circulation obstruction and moderate bilateral ICA stenosis  Technique: this is a 17 channel routine scalp EEG performed at the bedside with bipolar and monopolar montages arranged in accordance to the international 10/20 system of electrode placement. One channel was dedicated to EKG recording.  The study was performed during wakefulness, drowsiness, and stage 2 sleep. No activating procedures performed.  Description:In the wakeful state, the best background consisted of a medium amplitude, posterior dominant, well sustained, symmetric and reactive 10 Hz rhythm. Drowsiness demonstrated dropout of the alpha rhythm. Stage 2 sleep showed symmetric and synchronous sleep spindles without intermixed epileptiform discharges. No focal or generalized epileptiform discharges noted.  No pathologic areas of slowing seen.  EKG showed sinus rhythm.  Impression: this is a normal awake and asleep EEG. Please, be aware that a normal EEG does not exclude the possibility of epilepsy.  Clinical correlation is advised.   Dorian Pod, MD [facility]

## 2014-08-24 NOTE — Progress Notes (Signed)
EEG completed, results pending. 

## 2014-08-24 NOTE — H&P (Signed)
Hospitalist Admission History and Physical  Patient name: Leslie Houston Medical record number: [id number] Date of birth: [birth year] Age: 64 y.o. Gender: female  Primary Care Provider: [facility]  Chief Complaint: syncope   History of Present Illness:This is a 64 y.o. year old female with significant past medical history of grade 2 diastolic dysfunction, CVA, DM, HTN, CAD presenting with syncope. Patient noted to have been admitted March 21 of March 24 for syncope. Workup included 2-D echocardiogram which showed grade 2 diastolic dysfunction. CT angiogram of the head and neck negative for any acute infarct though with 50-60% stenosis of the internal carotid arteries bilaterally. Case discussed w/ vascular surgery per report-not felt to be candidate for intervention. Please see discharge summary for full details. Patient with noted marked orthostasis with systolic blood pressures dropping into the 80s. Patient's antihypertensive medications were held. Per patient she has not taken any of her antihypertensive medications since leaving the hospital. However, has had recurrent episodes of syncope upon ambulation. Denies any chest pain or shortness of breath. No neurocardiogenic prodrome. Symptoms are recurrent every time patient tends to ambulate. Presents to the ER afebrile, heart rate in the 40s to 90s still mainly in the 50s to 60s, heart rate in the tens, blood pressure in the 130s to 200s over 50s to 70s, satting 100% on room air. CBC and BMP within normal limits/stable from last admission. EKG sinus bradycardia, prolonged pr interval  Assessment and Plan: Leslie Houston is a 64 y.o. year old female presenting with syncope  Active Problems:   Syncope   1- Syncope -recurrent from last admission -2D ECHO w/ noted diastolic dysfunction -MRI w/ sedation (claustrophobia) -noted persistent bradycardia  -hold BB -cards c/s in am   2-Diastolic dysfunction  -2D ECHO 08/22/14 EF 50-55%,  grade 2 diastolic dysfunction  -hold BB given above -appears to be euvolemic -cards c/s in am   3-CVA -residual R sided weakness s/p CVA per pt -no worsening  -MRI  -follow   4-HTN  -elevated BP on presentation  -hold BPs meds given above -follow   5-DM  -SSI  -A1C   6-CAD -no active CP -trop neg x1 -cont ASA -tele bed  7-UTI -rocephin -urine culture  8-urinary retention  -foley  -[facility]rology studies w/ [facility] per report -follow up as clinically indicated   FEN/GI: heart healthy-carb modified diet  Prophylaxis: sub q heparin  Disposition: pending further evaluation  Code Status:Full Code    Patient Active Problem List   Diagnosis Date Noted  . Urinary retention 08/20/2014  . Syncope 08/19/2014  . Dizziness 08/19/2014  . History of stroke 08/19/2014  . CKD (chronic kidney disease) stage 3, GFR 30-59 ml/min 08/19/2014  . OBESITY 03/04/2006  . ANXIETY 03/04/2006  . Essential hypertension 03/04/2006  . GERD 03/04/2006  . PEPTIC ULCER DISEASE 03/04/2006  . MENOPAUSAL SYNDROME 03/04/2006  . HIP PAIN, LEFT 03/04/2006  . KNEE PAIN 03/04/2006   Past Medical History: Past Medical History  Diagnosis Date  . Stroke   . CHF (congestive heart failure)   . Diabetes mellitus without complication   . MI (myocardial infarction)     Past Surgical History: Past Surgical History  Procedure Laterality Date  . Cesarean section      Social History: History   Social History  . Marital Status: Single    Spouse Name: N/A  . Number of Children: N/A  . Years of Education: N/A   Social History Main Topics  . Smoking status: Former Smoker  Quit date: 03/30/2014  . Smokeless tobacco: Not on file  . Alcohol Use: No  . Drug Use: No  . Sexual Activity: Not on file   Other Topics Concern  . None   Social History Narrative    Family History: Family History  Problem Relation Age of Onset  . Stroke Mother   . Stroke Father   . Prostate cancer Father    . Diabetes Mellitus II Daughter     Allergies: No Known Allergies  Current Facility-Administered Medications  Medication Dose Route Frequency Provider Last Rate Last Dose  . cefTRIAXone (ROCEPHIN) 1 g in dextrose 5 % 50 mL IVPB  1 g Intravenous Q24H Deneise Lever, MD      . enoxaparin (LOVENOX) injection 30 mg  30 mg Subcutaneous Q24H Deneise Lever, MD      . sodium chloride 0.9 % injection 3 mL  3 mL Intravenous Q12H Deneise Lever, MD       Current Outpatient Prescriptions  Medication Sig Dispense Refill  . aspirin EC 81 MG tablet Take 81 mg by mouth daily.    . fluticasone (FLONASE) 50 MCG/ACT nasal spray Place 2 sprays into both nostrils daily as needed for allergies (allergies).     . hydrALAZINE (APRESOLINE) 10 MG tablet Take 1 tablet (10 mg total) by mouth 3 (three) times daily. 90 tablet 0  . isosorbide mononitrate (IMDUR) 30 MG 24 hr tablet Take 30 mg by mouth daily.    [name] loratadine (CLARITIN) 10 MG tablet Take 10 mg by mouth daily.    [name] lovastatin (MEVACOR) 40 MG tablet Take 40 mg by mouth at bedtime.    . metoprolol succinate (TOPROL-XL) 25 MG 24 hr tablet Take 25 mg by mouth at bedtime.     . ondansetron (ZOFRAN) 4 MG tablet Take 4 mg by mouth every 8 (eight) hours as needed for nausea or vomiting (nausea).      Review Of Systems: 12 point ROS negative except as noted above in HPI.  Physical Exam: Filed Vitals:   08/24/14 0300  BP: 169/79  Pulse: 50  Temp:   Resp: 15    General: alert, cooperative and mildly obese HEENT: PERRLA and extra ocular movement intact Heart: S1, S2 normal, no murmur, rub or gallop, regular rate and rhythm Lungs: clear to auscultation, no wheezes or rales and unlabored breathing Abdomen: abdomen is soft without significant tenderness, masses, organomegaly or guarding Extremities: extremities normal, atraumatic, no cyanosis or edema Skin:no rashes Neurology: grossly normal exam  Labs and Imaging: Lab Results  Component Value  Date/Time   NA 142 08/24/2014 12:38 AM   K 3.7 08/24/2014 12:38 AM   CL 108 08/24/2014 12:38 AM   CO2 21 08/24/2014 12:38 AM   BUN 14 08/24/2014 12:38 AM   CREATININE 1.28* 08/24/2014 12:38 AM   GLUCOSE 91 08/24/2014 12:38 AM   Lab Results  Component Value Date   WBC 4.1 08/24/2014   HGB 14.7 08/24/2014   HCT 44.1 08/24/2014   MCV 94.0 08/24/2014   PLT 173 08/24/2014   Urinalysis    Component Value Date/Time   COLORURINE YELLOW 08/24/2014 0220   APPEARANCEUR CLOUDY* 08/24/2014 0220   LABSPEC 1.011 08/24/2014 0220   PHURINE 6.5 08/24/2014 0220   GLUCOSEU NEGATIVE 08/24/2014 0220   HGBUR NEGATIVE 08/24/2014 0220   BILIRUBINUR NEGATIVE 08/24/2014 0220   KETONESUR NEGATIVE 08/24/2014 0220   PROTEINUR NEGATIVE 08/24/2014 0220   UROBILINOGEN 0.2 08/24/2014 0220   NITRITE NEGATIVE 08/24/2014 0220  LEUKOCYTESUR SMALL* 08/24/2014 0220      No results found.         Shanda Howells MD  Pager: [phone]

## 2014-08-24 NOTE — Evaluation (Signed)
Clinical/Bedside Swallow Evaluation Patient Details  Name: Leslie Houston MRN: [id number] Date of Birth: 05-Jul-1950  Today's Date: 08/24/2014 Time: SLP Start Time (ACUTE ONLY): 1340 SLP Stop Time (ACUTE ONLY): 1346 SLP Time Calculation (min) (ACUTE ONLY): 6 min  Past Medical History:  Past Medical History  Diagnosis Date  . Stroke   . CHF (congestive heart failure)   . Diabetes mellitus without complication   . MI (myocardial infarction)    Past Surgical History:  Past Surgical History  Procedure Laterality Date  . Cesarean section     HPI:  64 y.o. year old female with significant past medical history of grade 2 diastolic dysfunction, CVA, DM, HTN, CAD presenting with syncope. Per MD note patient noted to have been admitted March 21 of March 24 for syncope. Found to have  50-60% stenosis of the internal carotid arteries bilaterally without recommendations for intervention (surgical). CXR Mild bibasilar atelectasis is noted. Lungs otherwise clear. Pt passed RN swallow screen however swallow assessment per speech ordered by MD.   Assessment / Plan / Recommendation Clinical Impression  Swallow assessment brief as pt with high BP per RN with need for head in semi reclined position. Pt verbalizing she "does not feel good." Pt denies current or prior dysphagia with CVA 03/2014. Observed with thin water and 2 pills without difficulty. Transit functional, no indications of airway compromise. CXR without evidence pna; afebrile; room air. Recommend she continue regular texture diet (does not don upper denture plate wtih po's at home), pills with water. No follow up needed.    Aspiration Risk  Mild    Diet Recommendation Regular;Thin liquid   Liquid Administration via: Cup;Straw Medication Administration: Whole meds with liquid Supervision: Patient able to self feed Compensations: Slow rate;Small sips/bites Postural Changes and/or Swallow Maneuvers: Seated upright 90 degrees    Other   Recommendations Oral Care Recommendations: Oral care BID   Follow Up Recommendations  None    Frequency and Duration        Pertinent Vitals/Pain "I don't feel good". RN present and aware         Swallow Study Prior Functional Status  Type of Home: House Available Help at Discharge: Family;Available 24 hours/day       Oral/Motor/Sensory Function Overall Oral Motor/Sensory Function: Appears within functional limits for tasks assessed   Ice Chips Ice chips: Not tested   Thin Liquid Thin Liquid: Within functional limits Presentation: Cup    Nectar Thick Nectar Thick Liquid: Not tested   Honey Thick Honey Thick Liquid: Not tested   Puree Puree: Not tested   Solid   GO Functional Assessment Tool Used:  (skilled clinical observation) Functional Limitations: Swallowing Swallow Current Status [id number]): 0 percent impaired, limited or restricted Swallow Goal Status [id number]): 0 percent impaired, limited or restricted Swallow Discharge Status [id number]): 0 percent impaired, limited or restricted  Solid: Not tested       [name] 08/24/2014,2:01 PM  Orbie Pyo Colvin Caroli.[employer] [phone]

## 2014-08-24 NOTE — Progress Notes (Signed)
Patient Demographics  Leslie Houston, is a 64 y.o. female, DOB - Oct 12, 1950, [id number]  Admit date - 08/23/2014   Admitting Physician Deneise Lever, MD  Outpatient Primary MD for the patient is [facility]  LOS -    Chief Complaint  Patient presents with  . Loss of Consciousness        Subjective:   Leslie Houston today has, No headache, No chest pain, No abdominal pain - No Nausea, No new weakness tingling or numbness, No Cough - SOB. Lightheaded upon standing up.  Assessment & Plan    1. Near syncope upon standing up. Not orthostatic, blood pressure appropriately rising upon standing up. Recent posterior circulation stroke. Still has little slurred speech. Borderline bradycardic on telemetry. All rate controlling agents stopped. Cardiology on board. Planning for a Myoview tomorrow. EEG is unremarkable. Neurology also requested to evaluate.   2. Chronic grade 2 diastolic dysfunction EF [id number]. Currently euvolemic, beta blocker discontinued due to bradycardia. Monitor.   3. Recent CVA with right-sided weakness and some slurred speech. On aspirin and statin for secondary prevention, PT on board, repeat MRI ordered.   4. Essential hypertension. Will place on low-dose Norvasc and monitor.    5. DM type II - doubt she has diabetes, question diagnosis sliding scale for now in case sugars rise.  Lab Results  Component Value Date   HGBA1C 5.0 08/20/2014    CBG (last 3)   Recent Labs  08/23/14 1126 08/24/14 0606 08/24/14 0812  GLUCAP 90 79 90     6. UTI. On Rocephin. Outpatient urology follow-up for intermittent urinary retention.     Code Status: Full  Family Communication: None  Disposition Plan: TBD   Procedures   CXR, EEG, pending Myoview,     Consults  Cards, Neuro   Medications  Scheduled Meds: . aspirin EC  81 mg Oral Daily  . cefTRIAXone (ROCEPHIN)  IV  1 g Intravenous Q24H  . enoxaparin (LOVENOX) injection  30 mg Subcutaneous Q24H  . insulin aspart  0-9 Units Subcutaneous 6 times per day  . LORazepam  1 mg Intravenous Once  . pravastatin  40 mg Oral q1800  . sodium chloride  3 mL Intravenous Q12H   Continuous Infusions:  PRN Meds:.  DVT Prophylaxis  Lovenox    Lab Results  Component Value Date   PLT DUPLICATE REQUEST 123456    Antibiotics     Anti-infectives    Start     Dose/Rate Route Frequency Ordered Stop   08/24/14 0430  cefTRIAXone (ROCEPHIN) 1 g in dextrose 5 % 50 mL IVPB     1 g 100 mL/hr over 30 Minutes Intravenous Every 24 hours 08/24/14 0426     08/24/14 0315  cefTRIAXone (ROCEPHIN) 1 g in dextrose 5 % 50 mL IVPB     1 g 100 mL/hr over 30 Minutes Intravenous  Once 08/24/14 0303 08/24/14 0431          Objective:   Filed Vitals:   08/24/14 0330 08/24/14 0345 08/24/14 0400 08/24/14 0529  BP: 182/73 174/69 179/67   Pulse: 64 57 58 60  Temp:    97.7 F (36.5 C)  TempSrc:    Oral  Resp: 15 15 15 18   Height:  Weight:    76.5 kg (168 lb 10.4 oz)  SpO2: 100% 99% 100% 100%    Wt Readings from Last 3 Encounters:  08/24/14 76.5 kg (168 lb 10.4 oz)  08/19/14 78.926 kg (174 lb)     Intake/Output Summary (Last 24 hours) at 08/24/14 1133 Last data filed at 08/24/14 0900  Gross per 24 hour  Intake    120 ml  Output    600 ml  Net   -480 ml     Physical Exam  Awake Alert, Oriented X 3, No new F.N deficits, Normal affect, does have baseline slurred speech [place].AT,PERRAL Supple Neck,No JVD, No cervical lymphadenopathy appriciated.  Symmetrical Chest wall movement, Good air movement bilaterally, CTAB RRR,No Gallops,Rubs or new Murmurs, No Parasternal Heave +ve B.Sounds, Abd Soft, No tenderness, No organomegaly appriciated, No rebound - guarding or rigidity. No Cyanosis,  Clubbing or edema, No new Rash or bruise      Data Review   Micro Results No results found for this or any previous visit (from the past 240 hour(s)).  Radiology Reports    X-ray Chest Pa And Lateral  08/24/2014   CLINICAL DATA:  Acute onset of syncope.  Initial encounter.  EXAM: CHEST  2 VIEW  COMPARISON:  Chest radiograph performed 08/18/2014  FINDINGS: The lungs are well-aerated. Mild bibasilar atelectasis is noted. There is no evidence of pleural effusion or pneumothorax.  The heart is normal in size; the mediastinal contour is within normal limits. No acute osseous abnormalities are seen.  IMPRESSION: Mild bibasilar atelectasis is noted.  Lungs otherwise clear.   Electronically Signed   By: Garald Balding M.D.   On: 08/24/2014 05:11        CBC  Recent Labs Lab 08/17/14 1846 08/18/14 1150 08/19/14 1822 08/20/14 0539 08/23/14 1213 08/24/14 0038 08/24/14 0424 08/24/14 0615  WBC 4.0 3.0* 5.0 3.5* 3.8* 4.1 3.9* DUPLICATE REQUEST  HGB Q000111Q 13.0 14.3 13.9 [id number] 123456 DUPLICATE REQUEST  HCT [id number] 40.7 42.8 43.5 XX123456 Q000111Q 0000000 DUPLICATE REQUEST  PLT 99991111 190 197 0000000 99991111 A999333 Q000111Q DUPLICATE REQUEST  MCV 123XX123 95.1 96.0 96.0 99991111 123456 XX123456 DUPLICATE REQUEST  MCH AB-123456789 30.4 32.1 30.7 XX123456 123456 XX123456 DUPLICATE REQUEST  MCHC 33.2 31.9 33.4 32.0 123456 XX123456 XX123456 DUPLICATE REQUEST  RDW 123456 12.3 12.1 12.2 Q000111Q Q000111Q 123456 DUPLICATE REQUEST  LYMPHSABS 1.6 1.0 1.1  --  1.0  --   --  1.3  MONOABS 0.2 0.2 0.4  --  0.2  --   --  0.3  EOSABS 0.1 0.0 0.0  --  0.0  --   --  0.1  BASOSABS 0.0 0.0 0.0  --  0.0  --   --  0.0    Chemistries   Recent Labs Lab 08/18/14 1150  08/19/14 1822 08/20/14 0539 08/23/14 1213 08/24/14 0038 08/24/14 0424 08/24/14 0615  NA 140  < > 139 139 142 142  --  140  K 5.8*  < > 3.7 3.6 4.2 3.7  --  3.7  CL 105  < > 108 108 111 108  --  106  CO2 26  < > 23 24 24 21   --  22  GLUCOSE 96  < > 90 88 93 91  --  99  BUN 14  < > 14 12 17 14   --  13  CREATININE 1.47*   < > 1.28* 1.17* 1.22* 1.28* 1.25* 1.25*  CALCIUM 9.7  < > 10.2 9.8 10.0 10.4  --  10.3  AST 50*  --  21 16  --  23  --  26  ALT 26  --  17 16  --  19  --  21  ALKPHOS 56  --  59 57  --  52  --  53  BILITOT 1.7*  --  0.8 0.8  --  0.8  --  0.5  < > = values in this interval not displayed. ------------------------------------------------------------------------------------------------------------------ estimated creatinine clearance is 42 mL/min (by C-G formula based on Cr of 1.25). ------------------------------------------------------------------------------------------------------------------ No results for input(s): HGBA1C in the last 72 hours. ------------------------------------------------------------------------------------------------------------------ No results for input(s): CHOL, HDL, LDLCALC, TRIG, CHOLHDL, LDLDIRECT in the last 72 hours. ------------------------------------------------------------------------------------------------------------------  Recent Labs  08/24/14 0430  TSH 1.351   ------------------------------------------------------------------------------------------------------------------ No results for input(s): VITAMINB12, FOLATE, FERRITIN, TIBC, IRON, RETICCTPCT in the last 72 hours.  Coagulation profile No results for input(s): INR, PROTIME in the last 168 hours.  No results for input(s): DDIMER in the last 72 hours.  Cardiac Enzymes  Recent Labs Lab 08/17/14 1846 08/19/14 1822 08/24/14 0038  TROPONINI <0.03 <0.03 0.03   ------------------------------------------------------------------------------------------------------------------ Invalid input(s): POCBNP     Time Spent in minutes   35   Bennett Ram K M.D on 08/24/2014 at 11:33 AM  Between 7am to 7pm - Pager - [phone]  After 7pm go to [email or web address] - password [facility]  [facility]   Office  [phone]

## 2014-08-24 NOTE — Progress Notes (Signed)
UR completed 

## 2014-08-24 NOTE — Consult Note (Signed)
Reason for Consult: Witnessed syncope  Requesting Physician: Candiss Norse  HPI: Leslie Houston is a 64 year old moderately overweight single African-American female mother of 2 children readmitted for witnessed loss of consciousness. She was recently discharged on 08/22/14 for similar episodes. She has a history of hypertension and a recent PCA stroke 2 months ago. A CT of her head and neck showed an old posterior circulation stroke without acute abnormalities performed within the last week, no evidence of posterior circulation obstruction and moderate bilateral ICA stenosis. 2-D echo was essentially normal except for diastolic dysfunction. She is on beta blocker, nitrates and hydralazine for labile hypertension and there is a question of an orthostatic component. When she lost consciousness yesterday there also is a question of seizure activity witnessed by her son and his in-home teacher. Her rhythm has been sinus bradycardia with occasional PVCs. She is otherwise asymptomatic.   Problem List: Patient Active Problem List   Diagnosis Date Noted  . Urinary retention 08/20/2014  . Syncope 08/19/2014  . Dizziness 08/19/2014  . History of stroke 08/19/2014  . CKD (chronic kidney disease) stage 3, GFR 30-59 ml/min 08/19/2014  . OBESITY 03/04/2006  . ANXIETY 03/04/2006  . Essential hypertension 03/04/2006  . GERD 03/04/2006  . PEPTIC ULCER DISEASE 03/04/2006  . MENOPAUSAL SYNDROME 03/04/2006  . HIP PAIN, LEFT 03/04/2006  . KNEE PAIN 03/04/2006    PMHx:  Past Medical History  Diagnosis Date  . Stroke   . CHF (congestive heart failure)   . Diabetes mellitus without complication   . MI (myocardial infarction)    Past Surgical History  Procedure Laterality Date  . Cesarean section      FAMHx: Family History  Problem Relation Age of Onset  . Stroke Mother   . Stroke Father   . Prostate cancer Father   . Diabetes Mellitus II Daughter     SOCHx:  reports that she quit smoking  about 4 months ago. She does not have any smokeless tobacco history on file. She reports that she does not drink alcohol or use illicit drugs.  ALLERGIES: No Known Allergies  ROS: Pertinent items are noted in HPI.  HOME MEDICATIONS: Prescriptions prior to admission  Medication Sig Dispense Refill Last Dose  . aspirin EC 81 MG tablet Take 81 mg by mouth daily.   08/23/2014 at Unknown time  . fluticasone (FLONASE) 50 MCG/ACT nasal spray Place 2 sprays into both nostrils daily as needed for allergies (allergies).    08/22/2014 at Unknown time  . hydrALAZINE (APRESOLINE) 10 MG tablet Take 1 tablet (10 mg total) by mouth 3 (three) times daily. 90 tablet 0 08/22/2014 at Unknown time  . isosorbide mononitrate (IMDUR) 30 MG 24 hr tablet Take 30 mg by mouth daily.   08/22/2014 at Unknown time  . loratadine (CLARITIN) 10 MG tablet Take 10 mg by mouth daily.   08/22/2014 at Unknown time  . lovastatin (MEVACOR) 40 MG tablet Take 40 mg by mouth at bedtime.   08/22/2014 at Unknown time  . metoprolol succinate (TOPROL-XL) 25 MG 24 hr tablet Take 25 mg by mouth at bedtime.    08/22/2014 at 1030 epm  . ondansetron (ZOFRAN) 4 MG tablet Take 4 mg by mouth every 8 (eight) hours as needed for nausea or vomiting (nausea).    08/22/2014 at Unknown time    HOSPITAL MEDICATIONS: I have reviewed the patient's current medications.  VITALS: Blood pressure 179/67, pulse 60, temperature 97.7 F (36.5 C), temperature source Oral, resp. rate  18, height 5\' 1"  (1.549 m), weight 168 lb 10.4 oz (76.5 kg), SpO2 100 %.  INPUT/OUTPUT I/O last 3 completed shifts: In: -  Out: 200 [Urine:200]      PHYSICAL EXAM: General appearance: alert and no distress Neck: no adenopathy, no carotid bruit, no JVD, supple, symmetrical, trachea midline and thyroid not enlarged, symmetric, no tenderness/mass/nodules Lungs: clear to auscultation bilaterally Heart: regular rate and rhythm, S1, S2 normal, no murmur, click, rub or  gallop Extremities: extremities normal, atraumatic, no cyanosis or edema  LABS:  BMP  Recent Labs  08/23/14 1213 08/24/14 0038 08/24/14 0424 08/24/14 0615  NA 142 142  --  140  K 4.2 3.7  --  3.7  CL 111 108  --  106  CO2 24 21  --  22  GLUCOSE 93 91  --  99  BUN 17 14  --  13  CREATININE 1.22* 1.28* 1.25* 1.25*  CALCIUM 10.0 10.4  --  10.3  GFRNONAA 45* 43* 44* 44*  GFRAA 53* 50* 51* 51*    CBC  Recent Labs Lab [id number]  WBC DUPLICATE REQUEST  RBC DUPLICATE REQUEST  HGB DUPLICATE REQUEST  HCT DUPLICATE REQUEST  PLT DUPLICATE REQUEST  MCV DUPLICATE REQUEST    HEMOGLOBIN A1C Lab Results  Component Value Date   HGBA1C 5.0 08/20/2014   MPG 97 08/20/2014    Cardiac Panel (last 3 results)  Recent Labs  08/17/14 1846 08/19/14 1822 08/24/14 0038  TROPONINI <0.03 <0.03 0.03    BNP (last 3 results) No results for input(s): PROBNP in the last 8760 hours.  TSH  Recent Labs  08/20/14 0539 08/24/14 0430  TSH 0.793 1.351    CHOLESTEROL No results for input(s): CHOL in the last 8760 hours.  Hepatic Function Panel  Recent Labs  08/20/14 0539 08/24/14 0038 08/24/14 0615  PROT 7.5 7.5 7.2  ALBUMIN 4.3 4.2 4.0  AST 16 23 26   ALT 16 19 21   ALKPHOS 57 52 53  BILITOT 0.8 0.8 0.5    IMAGING: X-ray Chest Pa And Lateral  08/24/2014   CLINICAL DATA:  Acute onset of syncope.  Initial encounter.  EXAM: CHEST  2 VIEW  COMPARISON:  Chest radiograph performed 08/18/2014  FINDINGS: The lungs are well-aerated. Mild bibasilar atelectasis is noted. There is no evidence of pleural effusion or pneumothorax.  The heart is normal in size; the mediastinal contour is within normal limits. No acute osseous abnormalities are seen.  IMPRESSION: Mild bibasilar atelectasis is noted.  Lungs otherwise clear.   Electronically Signed   By: Garald Balding M.D.   On: 08/24/2014 05:11   EKG- Sinus bradycardia with nonspecific ST and T-wave changes, LVH with repolarization  changes. (I personally reviewed this EKG).  IMPRESSION: 1. syncope-patient has had new episode of syncope with the last 2 weeks. There is a question of an orthostatic component. CT scan of head and neck showed old PCA infarct with moderate bilateral ICA stenosis. Her rhythm has been sinus bradycardia. She has normal LV function.   RECOMMENDATION: 1. Hold beta blocker, neurology evaluation  to rule out a neurologic etiology such as seizure activity in the setting of recent PCA stroke, 1 month event monitor. She will also need orthostatic blood pressures checked while in the hospital. We will continue to follow her.  Time Spent Directly with Patient: 30 minutes  Cabrini Ruggieri J 08/24/2014, 8:28 AM

## 2014-08-24 NOTE — ED Provider Notes (Signed)
CSN: [id number]     Arrival date & time 08/23/14  1750 History   First MD Initiated Contact with Patient 08/23/14 2355     Chief Complaint  Patient presents with  . Loss of Consciousness     (Consider location/radiation/quality/duration/timing/severity/associated sxs/prior Treatment) Patient is a 64 y.o. female presenting with syncope. The history is provided by the patient and a friend.  Loss of Consciousness Associated symptoms: no chest pain, no confusion, no fever, no headaches, no shortness of breath, no vomiting and no weakness   pt with hx occipital cva a few months ago, c/o recurrent near syncopal/syncopal events in the past month. Today, had 2 recurrent syncopal events, once while standing, the other while sitting upright in ED.  Generally worse w stands up. Notes several episodes and recent admission for same.  Indicates was told was on too much bp medication and was recently adjusted - pt unsure of specifics, states did not fill new med yet. Pt  w several recent evals for same symptoms.  Within past week pt w ct abd, ct head, and ct angio head/neck neg for acute process.  Pt states bp med recently adjusted, but hasnt filled new rx yet.   Pt/friend deny any injury/trauma with any prior syncopal events. No headaches. No neck or back pain. Pt states normal appetite. No nvd. No recent blood loss or melena. Pt denies cp or discomfort. No unusual doe. No cough or uri c/o. No fever or chills. No abd pain. No dysuria or gu c/o. Pt denies palpitations. Denies hx dysrhythmia. Hx heart disease. No leg pain or swelling. No pleuritic pain. No hx dvt or pe.       Past Medical History  Diagnosis Date  . Stroke   . CHF (congestive heart failure)   . Diabetes mellitus without complication   . MI (myocardial infarction)    Past Surgical History  Procedure Laterality Date  . Cesarean section     Family History  Problem Relation Age of Onset  . Stroke Mother   . Stroke Father   . Prostate  cancer Father   . Diabetes Mellitus II Daughter    History  Substance Use Topics  . Smoking status: Former Smoker    Quit date: 03/30/2014  . Smokeless tobacco: Not on file  . Alcohol Use: No   OB History    No data available     Review of Systems  Constitutional: Negative for fever and chills.  HENT: Negative for sore throat.   Eyes: Negative for pain, redness and visual disturbance.  Respiratory: Negative for cough and shortness of breath.   Cardiovascular: Positive for syncope. Negative for chest pain and leg swelling.  Gastrointestinal: Negative for vomiting, abdominal pain, diarrhea and blood in stool.  Endocrine: Negative for polyuria.  Genitourinary: Negative for dysuria and flank pain.  Musculoskeletal: Negative for back pain and neck pain.  Skin: Negative for rash.  Neurological: Positive for syncope and light-headedness. Negative for speech difficulty, weakness, numbness and headaches.  Hematological: Does not bruise/bleed easily.  Psychiatric/Behavioral: Negative for confusion.      Allergies  Review of patient's allergies indicates no known allergies.  Home Medications   Prior to Admission medications   Medication Sig Start Date End Date Taking? Authorizing Provider  aspirin EC 81 MG tablet Take 81 mg by mouth daily.   Yes Historical Provider, MD  fluticasone (FLONASE) 50 MCG/ACT nasal spray Place 2 sprays into both nostrils daily as needed for allergies (allergies).  Yes Historical Provider, MD  hydrALAZINE (APRESOLINE) 10 MG tablet Take 1 tablet (10 mg total) by mouth 3 (three) times daily. 08/22/14  Yes Debbe Odea, MD  isosorbide mononitrate (IMDUR) 30 MG 24 hr tablet Take 30 mg by mouth daily.   Yes Historical Provider, MD  loratadine (CLARITIN) 10 MG tablet Take 10 mg by mouth daily.   Yes Historical Provider, MD  lovastatin (MEVACOR) 40 MG tablet Take 40 mg by mouth at bedtime.   Yes Historical Provider, MD  metoprolol succinate (TOPROL-XL) 25 MG 24  hr tablet Take 25 mg by mouth at bedtime.    Yes Historical Provider, MD  ondansetron (ZOFRAN) 4 MG tablet Take 4 mg by mouth every 8 (eight) hours as needed for nausea or vomiting (nausea).    Yes Historical Provider, MD   BP 217/78 mmHg  Pulse 68  Temp(Src) 97.8 F (36.6 C) (Oral)  Resp 18  Ht 5\' 1"  (1.549 m)  Wt 174 lb (78.926 kg)  BMI 32.89 kg/m2  SpO2 100% Physical Exam  Constitutional: She is oriented to person, place, and time. She appears well-developed and well-nourished. No distress.  HENT:  Head: Atraumatic.  Mouth/Throat: Oropharynx is clear and moist.  Eyes: Conjunctivae and EOM are normal. Pupils are equal, round, and reactive to light. No scleral icterus.  Neck: Normal range of motion. Neck supple. No tracheal deviation present. No thyromegaly present.  No bruit  Cardiovascular: Normal rate, regular rhythm, normal heart sounds and intact distal pulses.  Exam reveals no gallop and no friction rub.   No murmur heard. Pulmonary/Chest: Effort normal and breath sounds normal. No respiratory distress.  Abdominal: Soft. Normal appearance and bowel sounds are normal. She exhibits no distension and no mass. There is no tenderness. There is no rebound and no guarding.  No puls mass  Genitourinary:  No cva tenderness  Musculoskeletal: She exhibits no edema or tenderness.  Neurological: She is alert and oriented to person, place, and time. No cranial nerve deficit.  No pronator drift. Equal grips. stre 5/5 bil. sens grossly intact.   Skin: Skin is warm and dry. No rash noted. She is not diaphoretic.  Psychiatric: She has a normal mood and affect.  Nursing note and vitals reviewed.   ED Course  Procedures (including critical care time) Labs Review  Results for orders placed or performed during the hospital encounter of 08/23/14  CBC  Result Value Ref Range   WBC 4.1 4.0 - 10.5 K/uL   RBC 4.69 3.87 - 5.11 MIL/uL   Hemoglobin 14.7 12.0 - 15.0 g/dL   HCT 44.1 36.0 - 46.0 %    MCV 94.0 78.0 - 100.0 fL   MCH 31.3 26.0 - 34.0 pg   MCHC 33.3 30.0 - 36.0 g/dL   RDW 12.1 11.5 - 15.5 %   Platelets 173 150 - 400 K/uL  Comprehensive metabolic panel  Result Value Ref Range   Sodium 142 135 - 145 mmol/L   Potassium 3.7 3.5 - 5.1 mmol/L   Chloride 108 96 - 112 mmol/L   CO2 21 19 - 32 mmol/L   Glucose, Bld 91 70 - 99 mg/dL   BUN 14 6 - 23 mg/dL   Creatinine, Ser 1.28 (H) 0.50 - 1.10 mg/dL   Calcium 10.4 8.4 - 10.5 mg/dL   Total Protein 7.5 6.0 - 8.3 g/dL   Albumin 4.2 3.5 - 5.2 g/dL   AST 23 0 - 37 U/L   ALT 19 0 - 35 U/L  Alkaline Phosphatase 52 39 - 117 U/L   Total Bilirubin 0.8 0.3 - 1.2 mg/dL   GFR calc non Af Amer 43 (L) >90 mL/min   GFR calc Af Amer 50 (L) >90 mL/min   Anion gap 13 5 - 15  Urinalysis, Routine w reflex microscopic  Result Value Ref Range   Color, Urine YELLOW YELLOW   APPearance CLOUDY (A) CLEAR   Specific Gravity, Urine 1.011 1.005 - 1.030   pH 6.5 5.0 - 8.0   Glucose, UA NEGATIVE NEGATIVE mg/dL   Hgb urine dipstick NEGATIVE NEGATIVE   Bilirubin Urine NEGATIVE NEGATIVE   Ketones, ur NEGATIVE NEGATIVE mg/dL   Protein, ur NEGATIVE NEGATIVE mg/dL   Urobilinogen, UA 0.2 0.0 - 1.0 mg/dL   Nitrite NEGATIVE NEGATIVE   Leukocytes, UA SMALL (A) NEGATIVE  Troponin I  Result Value Ref Range   Troponin I 0.03 <0.031 ng/mL  Urine microscopic-add on  Result Value Ref Range   Squamous Epithelial / LPF MANY (A) RARE   WBC, UA 21-50 <3 WBC/hpf   RBC / HPF 0-2 <3 RBC/hpf   Bacteria, UA MANY (A) RARE   Ct Angio Head W/cm &/or Wo Cm  08/20/2014   CLINICAL DATA:  Left PCA infarct 2 months ago. New onset dizziness and presyncopal symptoms upon standing.  EXAM: CT ANGIOGRAPHY HEAD AND NECK  TECHNIQUE: Multidetector CT imaging of the head and neck was performed using the standard protocol during bolus administration of intravenous contrast. Multiplanar CT image reconstructions and MIPs were obtained to evaluate the vascular anatomy. Carotid stenosis  measurements (when applicable) are obtained utilizing NASCET criteria, using the distal internal carotid diameter as the denominator.  CONTRAST:  170mL OMNIPAQUE IOHEXOL 350 MG/ML SOLN  COMPARISON:  None.  CT head without contrast 08/19/2014. MRI brain without contrast 06/22/2014.  FINDINGS: CT HEAD  Brain: The remote left PCA territory infarct is again seen. No acute infarct, hemorrhage, or mass lesion is present. The basal ganglia are intact. Gray-white differentiation is otherwise preserved.  Calvarium and skull base: Negative  Paranasal sinuses: Clear  Orbits: Within normal limits.  CTA NECK  Aortic arch: A common origin of the left common carotid artery and innominate artery are noted.  Right carotid system: The right common carotid artery is within normal limits. Dense atherosclerotic calcifications are present at the aortic arch. Calcified and noncalcified plaque narrows the proximal right internal carotid artery to approximately 50% of the distal lumen. The cervical left ICA scratch the cervical right ICA is tortuous without additional stenoses.  Left carotid system: A left common carotid artery is within normal limits. Calcified and noncalcified plaque narrow the lumen of the proximal left internal carotid artery to 1.9 mm. This compares with 4.1 mm distally. There is mild tortuosity of the cervical left ICA otherwise without additional stenoses.  Vertebral arteries:The vertebral arteries originate from the subclavian arteries bilaterally. The right vertebral artery is the dominant vessel. There are no significant stenoses in the neck scratch the there are no significant vertebral artery stenoses in the neck.  Skeleton: There straightening of the normal cervical lordosis. Mild degenerative changes are present at each level from C2-3 through C6-7. Mild osseous foraminal narrowing is evident. No focal lytic or blastic lesions are present.  Other neck: Incidental note is made of or foraminal lobe of the  thyroid. No discrete lesions are present within the thyroid. There is no significant adenopathy. Salivary glands are within normal limits. No focal mucosal or submucosal lesions are present.  CTA  HEAD  Anterior circulation: Minimal atherosclerotic calcifications are present within the cavernous internal carotid arteries bilaterally. There is no significant stenosis. The terminal ICA is normal. A1 and M1 segments are within normal limits. The anterior communicating artery is patent. MCA bifurcations are within normal limits. ACA and MCA branch vessels are normal.  Posterior circulation: The right vertebral artery is slightly dominant to the left. The PICA origins are visualized and normal bilaterally. The vertebrobasilar junction is within normal limits. The basilar artery is normal. Both posterior cerebral arteries originate from the basilar tip. The PCA branch vessels are unremarkable.  Venous sinuses: The dural sinuses are patent. The right transverse sinus is the dominant vessel. The left internal jugular vein and sigmoid sinus are hypoplastic. The straight sinus and deep cerebral veins are patent.  Anatomic variants: None.  Delayed phase:No pathologic enhancement is present.  IMPRESSION: 1. Stable appearance of two-month old left PCA territory infarct. 2. No evidence for acute infarct. 3. 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 4. No significant of posterior circulation stenosis identified. 5. Mild spondylosis of the cervical spine.   Electronically Signed   By: San Morelle M.D.   On: 08/20/2014 17:53   Dg Chest 2 View  08/18/2014   CLINICAL DATA:  Epigastric pain.  Nausea and vomiting  EXAM: CHEST  2 VIEW  COMPARISON:  None.  FINDINGS: Mild bibasilar airspace disease most consistent with atelectasis. No definite pneumonia or effusion. Negative for heart failure.  IMPRESSION: Mild bibasilar atelectasis.   Electronically Signed   By: Franchot Gallo M.D.   On: 08/18/2014  12:33   Ct Angio Neck W/cm &/or Wo/cm  08/20/2014   CLINICAL DATA:  Left PCA infarct 2 months ago. New onset dizziness and presyncopal symptoms upon standing.  EXAM: CT ANGIOGRAPHY HEAD AND NECK  TECHNIQUE: Multidetector CT imaging of the head and neck was performed using the standard protocol during bolus administration of intravenous contrast. Multiplanar CT image reconstructions and MIPs were obtained to evaluate the vascular anatomy. Carotid stenosis measurements (when applicable) are obtained utilizing NASCET criteria, using the distal internal carotid diameter as the denominator.  CONTRAST:  137mL OMNIPAQUE IOHEXOL 350 MG/ML SOLN  COMPARISON:  None.  CT head without contrast 08/19/2014. MRI brain without contrast 06/22/2014.  FINDINGS: CT HEAD  Brain: The remote left PCA territory infarct is again seen. No acute infarct, hemorrhage, or mass lesion is present. The basal ganglia are intact. Gray-white differentiation is otherwise preserved.  Calvarium and skull base: Negative  Paranasal sinuses: Clear  Orbits: Within normal limits.  CTA NECK  Aortic arch: A common origin of the left common carotid artery and innominate artery are noted.  Right carotid system: The right common carotid artery is within normal limits. Dense atherosclerotic calcifications are present at the aortic arch. Calcified and noncalcified plaque narrows the proximal right internal carotid artery to approximately 50% of the distal lumen. The cervical left ICA scratch the cervical right ICA is tortuous without additional stenoses.  Left carotid system: A left common carotid artery is within normal limits. Calcified and noncalcified plaque narrow the lumen of the proximal left internal carotid artery to 1.9 mm. This compares with 4.1 mm distally. There is mild tortuosity of the cervical left ICA otherwise without additional stenoses.  Vertebral arteries:The vertebral arteries originate from the subclavian arteries bilaterally. The right  vertebral artery is the dominant vessel. There are no significant stenoses in the neck scratch the there are no significant vertebral artery stenoses  in the neck.  Skeleton: There straightening of the normal cervical lordosis. Mild degenerative changes are present at each level from C2-3 through C6-7. Mild osseous foraminal narrowing is evident. No focal lytic or blastic lesions are present.  Other neck: Incidental note is made of or foraminal lobe of the thyroid. No discrete lesions are present within the thyroid. There is no significant adenopathy. Salivary glands are within normal limits. No focal mucosal or submucosal lesions are present.  CTA HEAD  Anterior circulation: Minimal atherosclerotic calcifications are present within the cavernous internal carotid arteries bilaterally. There is no significant stenosis. The terminal ICA is normal. A1 and M1 segments are within normal limits. The anterior communicating artery is patent. MCA bifurcations are within normal limits. ACA and MCA branch vessels are normal.  Posterior circulation: The right vertebral artery is slightly dominant to the left. The PICA origins are visualized and normal bilaterally. The vertebrobasilar junction is within normal limits. The basilar artery is normal. Both posterior cerebral arteries originate from the basilar tip. The PCA branch vessels are unremarkable.  Venous sinuses: The dural sinuses are patent. The right transverse sinus is the dominant vessel. The left internal jugular vein and sigmoid sinus are hypoplastic. The straight sinus and deep cerebral veins are patent.  Anatomic variants: None.  Delayed phase:No pathologic enhancement is present.  IMPRESSION: 1. Stable appearance of two-month old left PCA territory infarct. 2. No evidence for acute infarct. 3. 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 4. No significant of posterior circulation stenosis identified. 5. Mild spondylosis of the cervical  spine.   Electronically Signed   By: San Morelle M.D.   On: 08/20/2014 17:53   Ct Abdomen Pelvis W Contrast  08/18/2014   CLINICAL DATA:  64 year old female with epigastric abdominal pain for 3 days with nausea and vomiting. Initial encounter.  EXAM: CT ABDOMEN AND PELVIS WITH CONTRAST  TECHNIQUE: Multidetector CT imaging of the abdomen and pelvis was performed using the standard protocol following bolus administration of intravenous contrast.  CONTRAST:  70mL OMNIPAQUE IOHEXOL 300 MG/ML  SOLN  COMPARISON:  CT Abdomen and Pelvis 04/11/2014.  FINDINGS: Cardiomegaly. No pericardial or pleural effusion. Improved ventilation at the lung bases with less ground-glass and mosaic attenuation. Mild dependent atelectasis now.  Degenerative changes in the spine. No acute osseous abnormality identified.  No pelvic free fluid.  Negative rectum.  Negative uterus and adnexa.  Severe bladder distension. Estimated bladder volume 588 mL. No bladder wall thickening or perivesical stranding.  Negative sigmoid colon. Decompressed and negative left colon. Negative transverse colon with gas and stool. Redundant hepatic flexure. Stool and oral contrast in the right colon. Normal appendix. Negative terminal ileum. No dilated small bowel. Decompressed stomach and duodenum.  Liver, gallbladder, spleen, pancreas and adrenal glands are within normal limits. The portal venous system is patent. Aortoiliac calcified atherosclerosis noted. Major arterial structures are patent. No abdominal free fluid.  Chronic hydronephrosis and extra renal pelvis. Symmetric renal contrast enhancement and excretion. Bilateral hydroureter without periureteral stranding or urologic calculus. No lymphadenopathy.  IMPRESSION: 1. Severe bladder distension (estimated bladder volume 588 mL) is felt responsible for bilateral hydroureter and hydronephrosis. Query bladder outlet obstruction. 2. No other acute or inflammatory process in the abdomen or pelvis.  Normal appendix.   Electronically Signed   By: Genevie Ann M.D.   On: 08/18/2014 16:00   Mr Attempted Daymon Larsen Report  08/20/2014   This examination belongs to an outside facility and is stored  here  for comparison purposes only.  Contact the originating outside  institution for any associated report or interpretation.  Ct Head Limited W/o Cm  08/19/2014   CLINICAL DATA:  Dizzy spells for 2 weeks. Presyncope. Recent cerebral infarction.  EXAM: CT HEAD WITHOUT CONTRAST  TECHNIQUE: Contiguous axial images were obtained from the base of the skull through the vertex without intravenous contrast.  COMPARISON:  MR brain 06/22/2014. CT head 06/22/2014. Most recent CT 07/24/2014.  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 No new areas of cerebral infarction are definitely identified.  No hemorrhage, mass lesion, hydrocephalus, or extra-axial fluid. Mild atrophy and small vessel disease. Calvarium intact. No sinus or mastoid disease. No change from March scan.  IMPRESSION: Chronic LEFT occipital infarct.  No acute features are evident.   Electronically Signed   By: Rolla Flatten M.D.   On: 08/19/2014 13:39       EKG Interpretation   Date/Time:  Tuesday August 24 2014 01:09:59 EDT Ventricular Rate:  53 PR Interval:  236 QRS Duration: 89 QT Interval:  527 QTC Calculation: 495 R Axis:   72 Text Interpretation:  Sinus bradycardia Prolonged PR interval Nonspecific  T abnormalities, diffuse leads Borderline prolonged QT interval Confirmed  by Ashok Cordia  MD, Lennette Bihari ([id number]) on 08/24/2014 1:38:12 AM      MDM   Iv ns. Labs. Monitor.  Reviewed nursing notes and prior charts for additional history.   Pt  w several recent evals for same symptoms.  Within past week pt w ct abd, ct head, and ct angio head/neck neg for acute process.  Pt states bp med recently adjusted, but hasnt filled new rx yet.   Pt/friend deny any  injury/trauma with any prior syncopal events.   Of note, on current ecg hr low 50's, and on review recent prior ecgs hr as low as upper 40's.  ?multifactorial recurrent near syncopal episodes related to bp meds, brady, b blocker, positional changes.   Labs remain pending.   Po fluids, meal.   uti on labs. Urine culture sent.  Given recurrent syncopal events, uti, will admit.  In addition to tx uti, pt may benefit from holding bblocker, and possibly cardiology eval/f/u/holter re recurrent syncope.   Hospitalists paged for admission.     Lajean Saver, MD 08/24/14 [phone]

## 2014-08-24 NOTE — Evaluation (Signed)
Physical Therapy Evaluation Patient Details Name: Leslie Houston MRN: [id number] DOB: March 14, 1951 Today's Date: 08/24/2014   History of Present Illness  Pt admitted with syncope and dizziness.  Pt recently d/c from [facility] for same issues.  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   Clinical Impression  Pt admitted with above diagnosis. Pt currently with functional limitations due to the deficits listed below (see PT Problem List).  Pt will benefit from skilled PT to increase their independence and safety with mobility to allow discharge to the venue listed below.  Orthostatics taken and with standing BP up to 202/88 and pt having syncopal episode and having to be A to EOB.  Pt was able to make a quick verbalization prior to passing out, that she was going to.  Pt returned supine and BP was 204/79 with HR 67 upon exit with nursing aware.  See orthostatics in vital section/flowsheets for all values.     Follow Up Recommendations Home health PT;Supervision/Assistance - 24 hour;Supervision for mobility/OOB    Equipment Recommendations  None recommended by PT    Recommendations for Other Services       Precautions / Restrictions Precautions Precautions: Fall Precaution Comments: Syncopal episode with standing during PT eval Restrictions Weight Bearing Restrictions: No      Mobility  Bed Mobility Overal bed mobility: Modified Independent Bed Mobility: Supine to Sit;Sit to Supine     Supine to sit: Modified independent (Device/Increase time) Sit to supine: Mod assist   General bed mobility comments: After BP taken in supine, came to EOB with MOD I.  Pt returned supine with MOD A after having syncopal episode.  Transfers Overall transfer level: Needs assistance Equipment used: Rolling walker (2 wheeled) Transfers: Sit to/from Stand Sit to Stand: Min assist         General transfer comment: Pt stood with MIN A for orthostatic BP.  At end of BP reading (202/88), pt had syncopal  episode ( pt able to verbalize she was having feeling of going out before passing out) and began to give way with PT assisting pt to EOB.  Pt came to within a few seconds but a little groggy initally.  Ambulation/Gait                Stairs            Wheelchair Mobility    Modified Rankin (Stroke Patients Only)       Balance   Sitting-balance support: Feet supported;Bilateral upper extremity supported Sitting balance-Leahy Scale: Good     Standing balance support: Bilateral upper extremity supported;During functional activity Standing balance-Leahy Scale: Poor Standing balance comment: Pt had syncopal episode                             Pertinent Vitals/Pain Pain Assessment: No/denies pain  Supine BP 153/97 HR 64 Sitting 170/70  HR 65 Standing 202/88 HR 84 **SYNCOPAL episode** Supine BP ~ 8 minutes later 204/79    Home Living Family/patient expects to be discharged to:: Private residence Living Arrangements: Children;Parent Available Help at Discharge: Family;Available 24 hours/day Type of Home: House Home Access: Stairs to enter   [employer] of Steps: 1 Home Layout: One level Home Equipment: Walker - 2 wheels      Prior Function Level of Independence: Independent with assistive device(s);Needs assistance   Gait / Transfers Assistance Needed: Pt reports she was using RW and does fine until she has a  spell of dizziness.       Comments: recent decline due to dizziness/lightheadedness     Hand Dominance        Extremity/Trunk Assessment   Upper Extremity Assessment: Overall WFL for tasks assessed           Lower Extremity Assessment: Generalized weakness      Cervical / Trunk Assessment: Normal  Communication   Communication: No difficulties  Cognition Arousal/Alertness: Awake/alert Behavior During Therapy: WFL for tasks assessed/performed Overall Cognitive Status: Within Functional Limits for tasks assessed                       General Comments General comments (skin integrity, edema, etc.): Pt states she can feel when she is going to pass out and feels a heaviness in her head and a feeling in her eyes.  Pt was able to make a quick verbalization to PT prior to her actual syncopal episode.  Pt reports there is not consistency with it, and she can sometimes get up and walk, and then other times, passes out.  Son called and spoke with PT on phone, and he is very concerned that this keeps occuring.    Exercises        Assessment/Plan    PT Assessment Patient needs continued PT services  PT Diagnosis Difficulty walking;Other (comment) (syncope)   PT Problem List Decreased activity tolerance;Decreased balance;Cardiopulmonary status limiting activity;Decreased mobility  PT Treatment Interventions Gait training;Functional mobility training;Therapeutic exercise;Therapeutic activities;Patient/family education   PT Goals (Current goals can be found in the Care Plan section) Acute Rehab PT Goals Patient Stated Goal: get this figured out so it can stop happening.  Pt concerned that people think she is making it up. PT Goal Formulation: With patient Time For Goal Achievement: 09/07/14 Potential to Achieve Goals: Good    Frequency Min 3X/week   Barriers to discharge        Co-evaluation               End of Session Equipment Utilized During Treatment: Gait belt Activity Tolerance: Treatment limited secondary to medical complications (Comment) (increased BP and syncopal episode) Patient left: in bed;with call bell/phone within reach Nurse Communication: Mobility status;Other (comment) (BP, syncope)    Functional Assessment Tool Used: clinical judgement Functional Limitation: Mobility: Walking and moving around Mobility: Walking and Moving Around Current Status [id number]): 100 percent impaired, limited or restricted Mobility: Walking and Moving Around Goal Status [phone]): At least 1  percent but less than 20 percent impaired, limited or restricted    Time: 1024-1052 PT Time Calculation (min) (ACUTE ONLY): 28 min   Charges:   PT Evaluation $Initial PT Evaluation Tier I: 1 Procedure PT Treatments $Therapeutic Activity: 8-22 mins   PT G Codes:   PT G-Codes **NOT FOR INPATIENT CLASS** Functional Assessment Tool Used: clinical judgement Functional Limitation: Mobility: Walking and moving around Mobility: Walking and Moving Around Current Status [id number]): 100 percent impaired, limited or restricted Mobility: Walking and Moving Around Goal Status [id number]): At least 1 percent but less than 20 percent impaired, limited or restricted    [facility] LUBECK 08/24/2014, 12:04 PM

## 2014-08-25 ENCOUNTER — Observation Stay (HOSPITAL_COMMUNITY): Payer: Medicare Other

## 2014-08-25 DIAGNOSIS — R001 Bradycardia, unspecified: Secondary | ICD-10-CM | POA: Diagnosis not present

## 2014-08-25 DIAGNOSIS — N390 Urinary tract infection, site not specified: Secondary | ICD-10-CM | POA: Insufficient documentation

## 2014-08-25 DIAGNOSIS — I638 Other cerebral infarction: Secondary | ICD-10-CM | POA: Diagnosis not present

## 2014-08-25 DIAGNOSIS — R42 Dizziness and giddiness: Secondary | ICD-10-CM | POA: Diagnosis not present

## 2014-08-25 DIAGNOSIS — I10 Essential (primary) hypertension: Secondary | ICD-10-CM | POA: Diagnosis not present

## 2014-08-25 DIAGNOSIS — R55 Syncope and collapse: Secondary | ICD-10-CM | POA: Diagnosis not present

## 2014-08-25 DIAGNOSIS — N183 Chronic kidney disease, stage 3 (moderate): Secondary | ICD-10-CM | POA: Diagnosis not present

## 2014-08-25 LAB — CBC WITH DIFFERENTIAL/PLATELET
BASOS ABS: 0 10*3/uL (ref 0.0–0.1)
Basophils Relative: 1 % (ref 0–1)
EOS ABS: 0.1 10*3/uL (ref 0.0–0.7)
EOS PCT: 2 % (ref 0–5)
HCT: 45.4 % (ref 36.0–46.0)
Hemoglobin: 15.3 g/dL — ABNORMAL HIGH (ref 12.0–15.0)
Lymphocytes Relative: 35 % (ref 12–46)
Lymphs Abs: 1.3 10*3/uL (ref 0.7–4.0)
MCH: 31.9 pg (ref 26.0–34.0)
MCHC: 33.7 g/dL (ref 30.0–36.0)
MCV: 94.8 fL (ref 78.0–100.0)
MONOS PCT: 12 % (ref 3–12)
Monocytes Absolute: 0.4 10*3/uL (ref 0.1–1.0)
Neutro Abs: 1.9 10*3/uL (ref 1.7–7.7)
Neutrophils Relative %: 50 % (ref 43–77)
Platelets: 185 10*3/uL (ref 150–400)
RBC: 4.79 MIL/uL (ref 3.87–5.11)
RDW: 12.2 % (ref 11.5–15.5)
WBC: 3.8 10*3/uL — AB (ref 4.0–10.5)

## 2014-08-25 LAB — COMPREHENSIVE METABOLIC PANEL
ALK PHOS: 53 U/L (ref 39–117)
ALT: 20 U/L (ref 0–35)
ANION GAP: 13 (ref 5–15)
AST: 21 U/L (ref 0–37)
Albumin: 3.8 g/dL (ref 3.5–5.2)
BILIRUBIN TOTAL: 0.9 mg/dL (ref 0.3–1.2)
BUN: 16 mg/dL (ref 6–23)
CHLORIDE: 104 mmol/L (ref 96–112)
CO2: 22 mmol/L (ref 19–32)
Calcium: 10.1 mg/dL (ref 8.4–10.5)
Creatinine, Ser: 1.45 mg/dL — ABNORMAL HIGH (ref 0.50–1.10)
GFR, EST AFRICAN AMERICAN: 43 mL/min — AB (ref >90)
GFR, EST NON AFRICAN AMERICAN: 37 mL/min — AB (ref >90)
GLUCOSE: 79 mg/dL (ref 70–99)
Potassium: 4 mmol/L (ref 3.5–5.1)
Sodium: 139 mmol/L (ref 135–145)
Total Protein: 7.4 g/dL (ref 6.0–8.3)

## 2014-08-25 LAB — GLUCOSE, CAPILLARY
GLUCOSE-CAPILLARY: 89 mg/dL (ref 70–99)
GLUCOSE-CAPILLARY: 70 mg/dL (ref 70–99)
GLUCOSE-CAPILLARY: 110 mg/dL — AB (ref 70–99)
Glucose-Capillary: 102 mg/dL — ABNORMAL HIGH (ref 70–99)
Glucose-Capillary: 90 mg/dL (ref 70–99)

## 2014-08-25 LAB — HEMOGLOBIN A1C
HEMOGLOBIN A1C: 5.1 % (ref 4.8–5.6)
Mean Plasma Glucose: 100 mg/dL

## 2014-08-25 MED ORDER — SODIUM CHLORIDE 0.9 % IV SOLN
INTRAVENOUS | Status: AC
  Administered 2014-08-25: 12:00:00 via INTRAVENOUS

## 2014-08-25 NOTE — Consult Note (Signed)
PATIENT NAME:  Leslie Houston, Leslie Houston MR#:  [id number] DATE OF BIRTH:  [birth year]  DATE OF CONSULTATION:  04/12/2014  REFERRING PHYSICIAN:   CONSULTING PHYSICIAN:  Sherlynn Stalls, MD  REQUESTING PHYSICIAN:  Dr. Myrtis Ser.    CONSULTANT:  Dr. [name], urology.   CHIEF COMPLAINT: Urinary retention, acute renal failure.   HISTORY OF PRESENT ILLNESS: This is a 64 year old female with several days of lower abdominal discomfort and pressure. She was found to have acute renal failure with a creatinine of 5.92 in the Emergency Room. A Foley catheter was then placed and 1500 mL of urine were drained. Noncontrast CT scan showed symmetric bilateral mild to moderate hydroureteronephrosis consistent with severe urinary retention.   She does note that over the past several weeks, she has been voiding extremely frequently very small amounts up to every 5 minute. She also has had urinary incontinence at nighttime which is also new for her. She denies any dysuria or gross hematuria. She does have a history of stress urinary incontinence at baseline, but otherwise denies a history of urinary retention or previous voiding issues prior to 1 month ago.   She does have a family history of renal failure and dialysis, her twin sister died of renal failure.   PAST MEDICAL HISTORY:   1.  History of diabetes.   2.  CVA.  3.  CHF.   4.  Heart disease.   PAST SURGICAL HISTORY:   She denies any previous surgeries.   ALLERGIES: No known drug allergies.    MEDICATIONS:  The patient was unsure of the exact doses of home medications, but does endorse taking hydrochlorothiazide, a pill for her stomach, lisinopril, and another medication for shingles. Medications and doses unknown otherwise.   SOCIAL HISTORY: She does have a history of smoking and is a current half a pack a day smoker. She denies any alcohol or other drug use.   FAMILY HISTORY: Father with colon cancer and prostate cancer. Mother with history  of heart disease and stroke, still living. Her sister died of renal failure and diabetes.   REVIEW OF SYSTEMS:  CONSTITUTIONAL: No fevers, chills, weight loss or weight gain.  EYES: No blurred vision.   EARS, NOSE, AND THROAT:  No sore throat.  No difficulty swallowing or hearing loss.  CARDIOVASCULAR: No chest pain. No palpitations.  RESPIRATORY: Some occasional shortness of breath. No cough. No hemoptysis.  GASTROINTESTINAL: No nausea.  No vomiting. She has had constipation and more recently loose stools.  GENITOURINARY: As per HPI.   MUSCULOSKELETAL: No back pain or muscle pain.  SKIN: No rashes or suspicious lesions.  PSYCHIATRIC: No anxiety or depression.  ENDOCRINE: No thyroid problem or skin changes. No hot flashes.  HEMATOLOGIC:  No anemia or easy bruising.   PHYSICAL EXAMINATION:   VITAL SIGNS:  97.8, pulse 51, blood pressure 161/83, 18 respirations per minute, 95% on room air. Urine output since 7:00 a.m. today is 2400 over a 12 hour period.  GENERAL:  No acute distress, alert and oriented.   EYES: Normal conjunctivae. Pupils equal, round, and reactive to light.  EARS, NOSE, AND THROAT: Poor dentition. Moist mucous membranes.  NECK:  No lymphadenopathy or thyroid abnormalities.  LUNGS: No use of accessory muscles. No increased work of breathing.  CARDIOVASCULAR: No lower extremity edema. No clubbing, cyanosis.  ABDOMEN: Soft, nontender, nondistended, obese. Bladder nonpalpable.   LYMPHATIC: No generalized lymphadenopathy.  SKIN:  No rashes, bruising, or suspicious lesions.  NEUROLOGIC: Cranial nerves grossly intact. Moving  all 4 extremities. No deficits. PSYCHIATRIC: Alert and oriented.  GENITOURINARY: No CVA tenderness bilaterally. 16 French silicone Foley catheter is in place draining light pink, slightly cloudy urine.   LABORATORY DATA: Creatinine 4.0 this morning, down from 5.92 on day of admission, baseline creatinine is unknown. LFTs within normal limits. WBCs 4.5,  hemoglobin 10.6, hematocrit 32.3, platelets 164,000. Urine culture from admission pending, colonies too small to currently read.  Urine is clear, straw-colored, negative glucose, negative bilirubin, negative ketones, specific gravity 1.005, pH 7, less than 1 red blood cell per high-powered field, 11 white blood cells per high-powered field, trace bacteria, 3 epithelial cells per high-powered field.   IMAGING:  CT abdomen and pelvis with oral contrast only shows bilateral moderate hydroureteronephrosis down to the level of the bladder, the bladder wall is not particularly thickened, in this particular CT scan the bladder is still slightly distended despite the Foley catheter in place. No other obvious pathology. No renal calculi.   ASSESSMENT AND PLAN: This is a 64 year old female with acute renal failure and massive urinary retention with 1.5 liters upon Foley catheter placement. Her creatinine is now trending down since the catheter was placed yesterday and she is continuing to make a large amount of urine consistent with mild postobstructive diuresis. Underlying etiology for her urinary retention includes neurogenic bladder (history of diabetes and history of stroke), medication or substance use, or infection. Her urinalysis is not particularly concerning for infection, although urine culture is pending. We discussed that she will likely need a Foley catheter for at least a week to adequately decompress her system and allow her renal function to recover hopefully to her baseline. I recommend renal ultrasound tomorrow to evaluate for resolution of her hydronephrosis. She will need to be followed closely on an outpatient basis for a voiding trial and possible urodynamics or CIC teaching. I will continue to follow this patient.     Thank you for allowing me to participate in the care of this nice lady.     ____________________________ Sherlynn Stalls, MD ajb:bu D: 04/12/2014 17:56:57 ET T: 04/12/2014  18:22:42 ET JOB#: [id number]  cc: Sherlynn Stalls, MD, <Dictator> Sherlynn Stalls MD ELECTRONICALLY SIGNED 05/12/2014 15:37

## 2014-08-25 NOTE — Progress Notes (Addendum)
Patient Demographics  Leslie Houston, is a 64 y.o. female, DOB - June 03, 1950, [id number]  Admit date - 08/23/2014   Admitting Physician Deneise Lever, MD  Outpatient Primary MD for the patient is [facility]  LOS -    Chief Complaint  Patient presents with  . Loss of Consciousness        Subjective:   Leslie Houston today has, No headache, No chest pain, No abdominal pain - No Nausea, No new weakness tingling or numbness, No Cough - SOB. Lightheaded upon standing up.  Assessment & Plan    1. Near syncope upon standing up. Not orthostatic, blood pressure appropriately rising upon standing up. Recent posterior circulation stroke. Still has little slurred speech. Borderline bradycardic on telemetry. All rate controlling agents stopped. Cardiology on board.  CT head and EEG is unremarkable. Neurology cleared for DC.   2. Chronic grade 2 diastolic dysfunction EF [id number]. Currently euvolemic, beta blocker discontinued due to bradycardia. Monitor.   3. Recent CVA with right-sided weakness and some slurred speech. On aspirin and statin for secondary prevention, PT on board, repeat MRI refused by Patient, CT -ve, cleared by Neuro.   4. Essential hypertension. Will place on low-dose Norvasc and monitor.    5. DM type II - doubt she has diabetes, question diagnosis sliding scale for now in case sugars rise.  Lab Results  Component Value Date   HGBA1C 5.1 08/24/2014    CBG (last 3)   Recent Labs  08/25/14 0035 08/25/14 0412 08/25/14 0825  GLUCAP 90 89 102*     6. UTI. On Rocephin. Outpatient urology follow-up for intermittent urinary retention.    7.ARF - Hydrate, check Bladder scan, BMP in am     Code Status: Full  Family Communication: None  Disposition Plan:  ? SNF   Procedures   CXR, CT Head, EEG,     Consults  Cards, Neuro   Medications  Scheduled Meds: . amLODipine  10 mg Oral Daily  . aspirin EC  81 mg Oral Daily  . cefTRIAXone (ROCEPHIN)  IV  1 g Intravenous Q24H  . enoxaparin (LOVENOX) injection  30 mg Subcutaneous Q24H  . insulin aspart  0-9 Units Subcutaneous 6 times per day  . LORazepam  1 mg Intravenous Once  . pravastatin  40 mg Oral q1800  . sodium chloride  3 mL Intravenous Q12H   Continuous Infusions: . sodium chloride     PRN Meds:.  DVT Prophylaxis  Lovenox    Lab Results  Component Value Date   PLT 185 08/25/2014    Antibiotics     Anti-infectives    Start     Dose/Rate Route Frequency Ordered Stop   08/24/14 0430  cefTRIAXone (ROCEPHIN) 1 g in dextrose 5 % 50 mL IVPB     1 g 100 mL/hr over 30 Minutes Intravenous Every 24 hours 08/24/14 0426     08/24/14 0315  cefTRIAXone (ROCEPHIN) 1 g in dextrose 5 % 50 mL IVPB     1 g 100 mL/hr over 30 Minutes Intravenous  Once 08/24/14 0303 08/24/14 0431          Objective:   Filed Vitals:   08/24/14 1432 08/24/14 2002 08/25/14 0413 08/25/14 1000  BP:  140/54    Pulse:  58 64   Temp: 98.2 F (36.8 C) 97.7 F (36.5 C) 98.1 F (36.7 C) 98.4 F (36.9 C)  TempSrc: Oral Oral Oral Oral  Resp: 17 18 18    Height:      Weight:   76.3 kg (168 lb 3.4 oz)   SpO2:  98% 97% 99%    Wt Readings from Last 3 Encounters:  08/25/14 76.3 kg (168 lb 3.4 oz)  08/19/14 78.926 kg (174 lb)     Intake/Output Summary (Last 24 hours) at 08/25/14 1136 Last data filed at 08/25/14 0900  Gross per 24 hour  Intake    250 ml  Output   1300 ml  Net  -1050 ml     Physical Exam  Awake Alert, Oriented X 3, No new F.N deficits, Normal affect, does have baseline slurred speech [place].AT,PERRAL Supple Neck,No JVD, No cervical lymphadenopathy appriciated.  Symmetrical Chest wall movement, Good air movement bilaterally, CTAB RRR,No Gallops,Rubs or new Murmurs, No Parasternal  Heave +ve B.Sounds, Abd Soft, No tenderness, No organomegaly appriciated, No rebound - guarding or rigidity. No Cyanosis, Clubbing or edema, No new Rash or bruise      Data Review   Micro Results No results found for this or any previous visit (from the past 240 hour(s)).  Radiology Reports    X-ray Chest Pa And Lateral  08/24/2014   CLINICAL DATA:  Acute onset of syncope.  Initial encounter.  EXAM: CHEST  2 VIEW  COMPARISON:  Chest radiograph performed 08/18/2014  FINDINGS: The lungs are well-aerated. Mild bibasilar atelectasis is noted. There is no evidence of pleural effusion or pneumothorax.  The heart is normal in size; the mediastinal contour is within normal limits. No acute osseous abnormalities are seen.  IMPRESSION: Mild bibasilar atelectasis is noted.  Lungs otherwise clear.   Electronically Signed   By: Garald Balding M.D.   On: 08/24/2014 05:11        CBC  Recent Labs Lab 08/18/14 1150 08/19/14 1822  08/23/14 1213 08/24/14 0038 08/24/14 0424 08/24/14 0615 08/25/14 0400  WBC 3.0* 5.0  < > 3.8* 4.1 3.9* DUPLICATE REQUEST 3.8*  HGB 13.0 14.3  < > 99991111 [id number] 123456 DUPLICATE REQUEST 99991111*  HCT 40.7 42.8  < > XX123456 Q000111Q 0000000 DUPLICATE REQUEST 123456  PLT 190 197  < > 99991111 A999333 Q000111Q DUPLICATE REQUEST [id number]  MCV 95.1 96.0  < > 99991111 123456 XX123456 DUPLICATE REQUEST 0000000  MCH 30.4 32.1  < > XX123456 123456 XX123456 DUPLICATE REQUEST [id number]  MCHC 31.9 33.4  < > 123456 XX123456 XX123456 DUPLICATE REQUEST XX123456  RDW 12.3 12.1  < > Q000111Q Q000111Q 123456 DUPLICATE REQUEST 123456  LYMPHSABS 1.0 1.1  --  1.0  --   --  1.3 1.3  MONOABS 0.2 0.4  --  0.2  --   --  0.3 0.4  EOSABS 0.0 0.0  --  0.0  --   --  0.1 0.1  BASOSABS 0.0 0.0  --  0.0  --   --  0.0 0.0  < > = values in this interval not displayed.  Chemistries   Recent Labs Lab 08/19/14 1822 08/20/14 0539 08/23/14 1213 08/24/14 0038 08/24/14 0424 08/24/14 0615 08/25/14 0400  NA 139 139 142 142  --  140 139  K 3.7 3.6 4.2 3.7  --  3.7 4.0  CL 108 108 111 108   --  106 104  CO2 23 24 24 21   --  22 22  GLUCOSE 90 88 93 91  --  99 79  BUN 14 12 17 14   --  13 16  CREATININE 1.28* 1.17* 1.22* 1.28* 1.25* 1.25* 1.45*  CALCIUM 10.2 9.8 10.0 10.4  --  10.3 10.1  AST 21 16  --  23  --  26 21  ALT 17 16  --  19  --  21 20  ALKPHOS 59 57  --  52  --  53 53  BILITOT 0.8 0.8  --  0.8  --  0.5 0.9   ------------------------------------------------------------------------------------------------------------------ estimated creatinine clearance is 36.1 mL/min (by C-G formula based on Cr of 1.45). ------------------------------------------------------------------------------------------------------------------  Recent Labs  08/24/14 0424  HGBA1C 5.1   ------------------------------------------------------------------------------------------------------------------ No results for input(s): CHOL, HDL, LDLCALC, TRIG, CHOLHDL, LDLDIRECT in the last 72 hours. ------------------------------------------------------------------------------------------------------------------  Recent Labs  08/24/14 0430  TSH 1.351   ------------------------------------------------------------------------------------------------------------------ No results for input(s): VITAMINB12, FOLATE, FERRITIN, TIBC, IRON, RETICCTPCT in the last 72 hours.  Coagulation profile No results for input(s): INR, PROTIME in the last 168 hours.  No results for input(s): DDIMER in the last 72 hours.  Cardiac Enzymes  Recent Labs Lab 08/19/14 1822 08/24/14 0038  TROPONINI <0.03 0.03   ------------------------------------------------------------------------------------------------------------------ Invalid input(s): POCBNP   Time Spent in minutes   35   SINGH,PRASHANT K M.D on 08/25/2014 at 11:36 AM  Between 7am to 7pm - Pager - [phone]  After 7pm go to [email or web address] - password [facility]  [facility]   Office  [phone]

## 2014-08-25 NOTE — Discharge Summary (Signed)
PATIENT NAME:  Leslie Houston, Leslie Houston MR#:  [id number] DATE OF BIRTH:  June 11, 1950  DATE OF ADMISSION:  04/11/2014 DATE OF DISCHARGE:  04/15/2014  ADMITTING PHYSICIAN: Leslie Conch. [name], MD  DISCHARGING PHYSICIAN: Leslie Lighter, MD  CONSULTATIONS IN THE HOSPITAL: Leslie Stalls, MD, urology.   PRIMARY CARE PHYSICIAN: Currently none.   DISCHARGE DIAGNOSES INCLUDE:  1.  Acute urinary retention; no obstruction. Being discharged with a Foley catheter.  2.  Acute renal failure.  3.  Hypertension.  4.  Chronic anemia.  5.  Acute cystitis.  6.  Tobacco use disorder.   DISCHARGE MEDICATION:  1.  Hydralazine 50 mg p.o. t.i.d.  2.  Imdur 30 mg p.o. daily.  3.  Norvasc 10 mg p.o. daily.   DISCHARGE DIET: Low-sodium diet.   DISCHARGE ACTIVITY: As tolerated.   FOLLOWUP INSTRUCTIONS:  1.  Foley with leg bag for a week.  2.  Urology followup in 1 week.  3.  Nephrology follow-up with Dr. Candiss Houston in 1 week.  4.  PCP follow-up in 2 weeks.  5.  Basic metabolic panel check with nephrology in 1 week.   LABORATORIES AND IMAGING STUDIES PRIOR TO DISCHARGE: Sodium is 141, potassium 4.8, chloride 113, bicarbonate 22, BUN 21, creatinine 2.37, glucose 107, and calcium of 9.2.   WBC 3.8, hemoglobin 10.6, hematocrit 32.7, platelet count is 175,000.  Ultrasound of the kidneys bilaterally showing mild bilateral pelviectasis, Foley catheter is present, bladder wall thickening and debris with the bladder lumen noted. No hydronephrosis noted on the ultrasound of the kidneys on 04/13/2014. Clostridium difficile is negative. Stool WBCs are negative. Stool cultures are negative. CT of the abdomen and pelvis without contrast on admission showing normal liver, gallbladder, spleen, pancreas and adrenal glands. Prominence of both ureters. No stones noted. Mild to moderate dilatation of the ureters noted.   BRIEF HOSPITAL COURSE: [name] is a 64 year old African American female with past medical history significant  for hypertension, questionable CVA or CAD. The patient has not been taking any medications for almost a year, and presents to the hospital secondary to abdominal pain.  1.  Acute urinary retention with mild to moderate hydroureters bilaterally. The patient had a bladder scan done on admission, which showed up to a liter of in the bladder with no external obstruction or compression. CT did not show any obstruction; but because changes are being reflected in the ureters and the kidneys, she had a Foley catheter put in. She also presented with acute renal failure. She was seen by urology, who recommended to continue with the Foley catheter and outpatient followup for a voiding trial, at which time they we will do needed urodynamic studies. So, the patient is being discharged with a Foley catheter at this time.  2.  Acute renal failure, presented with elevated creatinine of 5.9. Baseline creatinine is not known. Kidneys appeared normal on the ultrasound and the CT. Her creatinine improved, with IV fluids and Foley catheter placement to almost 3 at the time of discharge. Outpatient followup with Dr. Candiss Houston has been recommended and blood work will be done as an outpatient, as well.  3.  Uncontrolled hypertension. Not taking any home medications at this time. Will need a PCP. Started on Imdur, hydralazine and Norvasc at this time. . Toprol has been discontinued because of bradycardia in the hospital.  4.  Acute cystitis. Finished antibiotics in the hospital.  5.  Anemia of chronic disease, stable at the time of discharge.  6.  Her course has  been otherwise uneventful in the hospital.   DISCHARGE CONDITION: Stable.   DISCHARGE DISPOSITION: Home.   TIME SPENT ON DISCHARGE: 40 minutes.    ____________________________ Leslie Lighter, MD rk:MT D: 04/16/2014 14:34:09 ET T: 04/16/2014 21:10:45 ET JOB#: [id number]  cc: Leslie Lighter, MD, <Dictator> Leslie Stalls, MD Leslie Iba, MD Leslie Lighter MD ELECTRONICALLY SIGNED 05/04/2014 14:47

## 2014-08-25 NOTE — Progress Notes (Addendum)
Subjective: patient has had no further spells. Patient refused MRI and obtained CT head which was normal.   Objective: Current vital signs: BP 140/54 mmHg  Pulse 64  Temp(Src) 98.1 F (36.7 C) (Oral)  Resp 18  Ht 5\' 1"  (1.549 m)  Wt 76.3 kg (168 lb 3.4 oz)  BMI 31.80 kg/m2  SpO2 97% Vital signs in last 24 hours: Temp:  [97.7 F (36.5 C)-98.2 F (36.8 C)] 98.1 F (36.7 C) (04/27 0413) Pulse Rate:  [58-64] 64 (04/27 0413) Resp:  [17-18] 18 (04/27 0413) BP: (140)/(54) 140/54 mmHg (04/26 2002) SpO2:  [97 %-98 %] 97 % (04/27 0413) Weight:  [76.3 kg (168 lb 3.4 oz)] 76.3 kg (168 lb 3.4 oz) (04/27 0413)  Intake/Output from previous day: 04/26 0701 - 04/27 0700 In: 250 [P.O.:240; I.V.:10] Out: 1400 [Urine:1400] Intake/Output this shift:   Nutritional status: Diet Heart Room service appropriate?: Yes; Fluid consistency:: Thin  Neurologic Exam: Mental Status: Alert, oriented, thought content appropriate. Speech with stutter but fluent without evidence of aphasia. Able to follow 3 step commands without difficulty. Cranial Nerves: II: Discs flat bilaterally; Visual fields shows a right upper quadrant field cut, pupils equal, round, reactive to light and accommodation III,IV, VI: ptosis not present, extra-ocular motions intact bilaterally V,VII: smile symmetric, facial light touch sensation normal bilaterally VIII: hearing normal bilaterally IX,X: uvula rises symmetrically XI: bilateral shoulder shrug XII: midline tongue extension Motor: Right :Upper extremity 5/5Left: Upper extremity 5/5 Lower extremity 5/5Lower extremity 5/5 Tone and bulk:normal tone throughout; no atrophy noted Sensory: Pinprick and light touch intact throughout, bilaterally Deep Tendon Reflexes: 2+ and symmetric throughout Plantars: Right: downgoingLeft:  downgoing Cerebellar: normal finger-to-nose,and normal heel-to-shin test Gait: not tested due to safety  Lab Results: Basic Metabolic Panel:  Recent Labs Lab 08/20/14 0539 08/23/14 1213 08/24/14 0038 08/24/14 0424 08/24/14 0615 08/25/14 0400  NA 139 142 142  --  140 139  K 3.6 4.2 3.7  --  3.7 4.0  CL 108 111 108  --  106 104  CO2 24 24 21   --  22 22  GLUCOSE 88 93 91  --  99 79  BUN 12 17 14   --  13 16  CREATININE 1.17* 1.22* 1.28* 1.25* 1.25* 1.45*  CALCIUM 9.8 10.0 10.4  --  10.3 10.1    Liver Function Tests:  Recent Labs Lab 08/19/14 1822 08/20/14 0539 08/24/14 0038 08/24/14 0615 08/25/14 0400  AST 21 16 23 26 21   ALT 17 16 19 21 20   ALKPHOS 59 57 52 53 53  BILITOT 0.8 0.8 0.8 0.5 0.9  PROT 7.9 7.5 7.5 7.2 7.4  ALBUMIN 4.3 4.3 4.2 4.0 3.8    Recent Labs Lab 08/18/14 1150  LIPASE 24   No results for input(s): AMMONIA in the last 168 hours.  CBC:  Recent Labs Lab 08/18/14 1150 08/19/14 1822  08/23/14 1213 08/24/14 0038 08/24/14 0424 08/24/14 0615 08/25/14 0400  WBC 3.0* 5.0  < > 3.8* 4.1 3.9* DUPLICATE REQUEST 3.8*  NEUTROABS 1.8 3.5  --  2.6  --   --  2.1 1.9  HGB 13.0 14.3  < > 99991111 [id number] DUPLICATE REQUEST [id number]*  HCT 40.7 42.8  < > [id number] Q000111Q 0000000 DUPLICATE REQUEST 123456  MCV 95.1 96.0  < > 99991111 123456 [id number] DUPLICATE REQUEST 0000000  PLT 190 197  < > 99991111 [id number] [id number] DUPLICATE REQUEST [id number]  < > = values in this interval not displayed.  Cardiac Enzymes:  Recent Labs Lab 08/19/14  1822 08/24/14 0038  TROPONINI <0.03 0.03    Lipid Panel: No results for input(s): CHOL, TRIG, HDL, CHOLHDL, VLDL, LDLCALC in the last 168 hours.  CBG:  Recent Labs Lab 08/24/14 1603 08/24/14 1959 08/25/14 0035 08/25/14 0412 08/25/14 0825  GLUCAP 102* 110* 90 89 102*    Microbiology: Results for orders placed or performed in visit on 04/11/14  Urine culture     Status: None   Collection Time: 04/11/14  3:15 PM  Result Value Ref Range Status   Micro  Text Report   Final       SOURCE: INDWELLING CATHETER    COMMENT                   MIXED BACTERIAL ORGANISMS   COMMENT                   RESULTS SUGGESTIVE OF CONTAMINATION   COMMENT                   SUGGEST RECOLLECTION   ANTIBIOTIC                                                      Stool culture     Status: None   Collection Time: 04/12/14 10:18 PM  Result Value Ref Range Status   Micro Text Report   Final       COMMENT                   NO SALMONELLA OR SHIGELLA ISOLATED   COMMENT                   NO PATHOGENIC E.COLI DETECTED   COMMENT                   NO CAMPYLOBACTER ANTIGEN DETECTED   ANTIBIOTIC                                                      Clostridium Difficile [facility])     Status: None   Collection Time: 04/12/14 10:18 PM  Result Value Ref Range Status   Micro Text Report   Final       C.DIFFICILE ANTIGEN       C.DIFFICILE GDH ANTIGEN : NEGATIVE   C.DIFFICILE TOXIN A/B     C.DIFFICILE TOXINS A AND B : NEGATIVE   INTERPRETATION            Negative for C. difficile.    ANTIBIOTIC                                                        Coagulation Studies: No results for input(s): LABPROT, INR in the last 72 hours.  Imaging: X-ray Chest Pa And Lateral  08/24/2014   CLINICAL DATA:  Acute onset of syncope.  Initial encounter.  EXAM: CHEST  2 VIEW  COMPARISON:  Chest radiograph performed 08/18/2014  FINDINGS: The lungs are well-aerated. Mild bibasilar atelectasis is noted. There  is no evidence of pleural effusion or pneumothorax.  The heart is normal in size; the mediastinal contour is within normal limits. No acute osseous abnormalities are seen.  IMPRESSION: Mild bibasilar atelectasis is noted.  Lungs otherwise clear.   Electronically Signed   By: Garald Balding M.D.   On: 08/24/2014 05:11   Ct Head Wo Contrast  08/25/2014   CLINICAL DATA:  Dizziness, more severe with standing.  EXAM: CT HEAD WITHOUT CONTRAST  TECHNIQUE: Contiguous axial images were obtained  from the base of the skull through the vertex without intravenous contrast.  COMPARISON:  August 20, 2014  FINDINGS: Mild diffuse atrophy is stable. There is no intracranial mass, hemorrhage, extra-axial fluid collection, or midline shift. The prior infarct involving the medial to mid left occipital lobe is stable. There is mild patchy small vessel disease in the centra semiovale bilaterally. There is no new gray-white compartment lesion. No acute infarct apparent. The bony calvarium appears intact. The mastoid air cells are clear.  IMPRESSION: Stable left occipital lobe infarct. Mild atrophy with mild periventricular small vessel disease, stable. No intracranial mass, hemorrhage, or acute appearing infarct.   Electronically Signed   By: Lowella Grip III M.D.   On: 08/25/2014 09:09    Medications:  Scheduled: . amLODipine  10 mg Oral Daily  . aspirin EC  81 mg Oral Daily  . cefTRIAXone (ROCEPHIN)  IV  1 g Intravenous Q24H  . enoxaparin (LOVENOX) injection  30 mg Subcutaneous Q24H  . insulin aspart  0-9 Units Subcutaneous 6 times per day  . LORazepam  1 mg Intravenous Once  . pravastatin  40 mg Oral q1800  . sodium chloride  3 mL Intravenous Q12H    Assessment/Plan:  64 YO female with multiple recurrent episodes of syncope/presyncopal events. EEG obtained and showed no epileptiform activity.Multiple orthostatic BP showed elevation of SBP while standing and no orthostasis. MRI was requested but patient refused.  CT head was obtained and negative. She has had no further events while in hospital. At this time agree with cardiology to have event monitor. As stated prior, etiology unclear, but unlikely of CNS origin.    Neurology will sign off at this point.   Etta Quill PA-C [facility] [phone]  I personally participated in this patient's evaluation and management, including formulating above clinical impression and management recommendations.  Rush Farmer M.D. [facility] [phone]  08/25/2014, 9:59 AM

## 2014-08-25 NOTE — Progress Notes (Signed)
Physical Therapy Treatment Patient Details Name: Leslie Houston MRN: [id number] DOB: 1950/06/30 Today's Date: 08/25/2014    History of Present Illness Pt admitted with syncope and dizziness.  Pt recently d/c from [facility] for same issues.  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     PT Comments    Pt very anxious re: returning home due to repeated episodes and repeated hospital admissions. She had no episodes or symptoms during PT today. Was unable to persuade her to walk outside her room due to very anxious "it happens when I'm up... I never know when. It makes no sense. I know some people think I'm crazy, but I'm not." Demonstrated safe use of RW and good balance (releasing RW and reaching above shoulder height for items and down to level of her knees). Do not feel HHPT is going to be of any additional value, as source of syncope remains unknown. Her description sounds cardiac in nature. Screened for vestibular issues with none found.    Follow Up Recommendations  No PT follow up;Supervision for mobility/OOB     Equipment Recommendations  None recommended by PT    Recommendations for Other Services       Precautions / Restrictions Precautions Precautions: Fall Precaution Comments: Syncopal episode with standing during PT eval    Mobility  Bed Mobility Overal bed mobility: Modified Independent                Transfers Overall transfer level: Needs assistance Equipment used: Rolling walker (2 wheeled) Transfers: Sit to/from Stand Sit to Stand: Min guard         General transfer comment: x 2; minguard for safey due to syncopal episodes; no imbalance, no dizziness, no cues needed for use of RW  Ambulation/Gait Ambulation/Gait assistance: Min guard Ambulation Distance (Feet): 15 Feet (to bathroom, 25 ft) Assistive device: Rolling walker (2 wheeled) Gait Pattern/deviations: Step-through pattern;Decreased stride length   Gait velocity interpretation: Below normal  speed for age/gender General Gait Details: pt very anxious re: ambulation due to recent syncope (4/26) and "never know when it's going to happen"   Stairs            Wheelchair Mobility    Modified Rankin (Stroke Patients Only)       Balance                                    Cognition Arousal/Alertness: Awake/alert Behavior During Therapy: Anxious Overall Cognitive Status: Within Functional Limits for tasks assessed                      Exercises      General Comments        Pertinent Vitals/Pain Pain Assessment: No/denies pain    Home Living                      Prior Function            PT Goals (current goals can now be found in the care plan section) Acute Rehab PT Goals Time For Goal Achievement: 09/07/14 Progress towards PT goals: Progressing toward goals    Frequency  Min 3X/week    PT Plan Discharge plan needs to be updated    Co-evaluation             End of Session Equipment Utilized During Treatment: Gait belt Activity Tolerance: Patient tolerated  treatment well Patient left: in chair;with call bell/phone within reach     Time: 1147-1213 PT Time Calculation (min) (ACUTE ONLY): 26 min  Charges:  $Gait Training: 8-22 mins $Therapeutic Activity: 8-22 mins                    G Codes:      Daijon Wenke 09/23/14, 12:22 PM Pager [phone]
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
## 2014-08-25 NOTE — Clinical Social Work Note (Signed)
Clinical Social Work Assessment  Patient Details  Name: Leslie Houston MRN: [id number] Date of Birth: Jun 18, 1950  Date of referral:  08/25/14               Reason for consult:  Facility Placement                Permission sought to share information with:  Family Supports Permission granted to share information::  Yes, Verbal Permission Granted  Name::     Nature conservation officer::     Relationship::  pt sister in Sports coach and pt son  Contact Information:     Housing/Transportation Living arrangements for the past 2 months:  Single Family Home Source of Information:  Patient Patient Interpreter Needed:  None Criminal Activity/Legal Involvement Pertinent to Current Situation/Hospitalization:  No - Comment as needed Significant Relationships:  Siblings, Adult Children Lives with:  Adult Children Do you feel safe going back to the place where you live?  Yes Need for family participation in patient care:  Yes (Comment)  Care giving concerns:  Pt currently lives at home with pt son and states that he is unable to provide much supervision or assistance- pt son is dialysis pt so he is gone for long periods of time during the day.   Social Worker assessment / plan:  CSW spoke with pt concerning consult for SNF- pt had requested [facility]  CSW explained that the pt is observation status and would not have SNF stay covered by Medicare.  Pt understands and states that her sister in law had stated she could come stay with her brother at time of DC- pt states that she feels safe with this plan.  Employment status:  Retired Forensic scientist:  Medicare PT Recommendations:  No Follow Up Information / Referral to community resources:  Outpatient Psychiatric Care (Comment Required) (per RN pt has high stress/ traumatic past)  Patient/Family's Response to care: Pt wished that she could go to [facility] but understands why she is unable to at this time.  Pt is somewhat frustrated by being told what  to do by family and by doctors (ex. Being told she needs 24 hour supervision).  Patient/Family's Understanding of and Emotional Response to Diagnosis, Current Treatment, and Prognosis:  Pt does not have great insight into current medical condition and is now exploring whether or not her episodes could be due to stress- pt is interested in medication to help reduce stress/blood pressure  Emotional Assessment Appearance:  Appears stated age Attitude/Demeanor/Rapport:    Affect (typically observed):  Tearful/Crying, Appropriate, Pleasant, Agitated Orientation:  Oriented to Self, Oriented to Place, Oriented to  Time, Oriented to Situation Alcohol / Substance use:    Psych involvement (Current and /or in the community):  No (Comment)  Discharge Needs  Concerns to be addressed:  Home Safety Concerns Readmission within the last 30 days:  Yes (observation) Current discharge risk:  Physical Impairment Barriers to Discharge:  Continued Medical Work up   [employer], LCSW 08/25/2014, 3:37 PM

## 2014-08-25 NOTE — Progress Notes (Signed)
Patient Name: Leslie Houston Date of Encounter: 08/25/2014     Principal Problem:   Syncope Active Problems:   Essential hypertension   History of stroke   CKD (chronic kidney disease) stage 3, GFR 30-59 ml/min   Bradycardia   Faintness    SUBJECTIVE  Had an episode this AM while talking to neuro PA. No events on tele. Otherwise feeling well. Wants to talk to primary service about going to a rehab facility   CURRENT MEDS . amLODipine  10 mg Oral Daily  . aspirin EC  81 mg Oral Daily  . cefTRIAXone (ROCEPHIN)  IV  1 g Intravenous Q24H  . enoxaparin (LOVENOX) injection  30 mg Subcutaneous Q24H  . insulin aspart  0-9 Units Subcutaneous 6 times per day  . LORazepam  1 mg Intravenous Once  . pravastatin  40 mg Oral q1800  . sodium chloride  3 mL Intravenous Q12H    OBJECTIVE  Filed Vitals:   08/24/14 1432 08/24/14 2002 08/25/14 0413 08/25/14 1000  BP:  140/54    Pulse:  58 64   Temp: 98.2 F (36.8 C) 97.7 F (36.5 C) 98.1 F (36.7 C) 98.4 F (36.9 C)  TempSrc: Oral Oral Oral Oral  Resp: 17 18 18    Height:      Weight:   168 lb 3.4 oz (76.3 kg)   SpO2:  98% 97% 99%    Intake/Output Summary (Last 24 hours) at 08/25/14 1015 Last data filed at 08/25/14 0423  Gross per 24 hour  Intake    130 ml  Output   1000 ml  Net   -870 ml   Filed Weights   08/23/14 1758 08/24/14 0529 08/25/14 0413  Weight: 174 lb (78.926 kg) 168 lb 10.4 oz (76.5 kg) 168 lb 3.4 oz (76.3 kg)    PHYSICAL EXAM  General: Pleasant, NAD. Neuro: Alert and oriented X 3. Moves all extremities spontaneously. Psych: Normal affect. HEENT:  Normal  Neck: Supple without bruits or JVD. Lungs:  Resp regular and unlabored, CTA. Heart: RRR no s3, s4, or murmurs. Abdomen: Soft, non-tender, non-distended, BS + x 4.  Extremities: No clubbing, cyanosis or edema. DP/PT/Radials 2+ and equal bilaterally.  Accessory Clinical Findings  CBC  Recent Labs  08/24/14 0615 [id number]  WBC DUPLICATE  REQUEST 3.8*  NEUTROABS 2.1 1.9  HGB DUPLICATE REQUEST [id number]*  HCT DUPLICATE REQUEST [id number]  MCV DUPLICATE REQUEST 0000000  PLT DUPLICATE REQUEST [id number]   Basic Metabolic Panel  Recent Labs  08/24/14 0615 08/25/14 0400  NA 140 139  K 3.7 4.0  CL 106 104  CO2 22 22  GLUCOSE 99 79  BUN 13 16  CREATININE 1.25* 1.45*  CALCIUM 10.3 10.1   Liver Function Tests  Recent Labs  08/24/14 0615 08/25/14 0400  AST 26 21  ALT 21 20  ALKPHOS 53 53  BILITOT 0.5 0.9  PROT 7.2 7.4  ALBUMIN 4.0 3.8   No results for input(s): LIPASE, AMYLASE in the last 72 hours. Cardiac Enzymes  Recent Labs  08/24/14 0038  TROPONINI 0.03   Hemoglobin A1C  Recent Labs  08/24/14 0424  HGBA1C 5.1   Fasting Lipid Panel No results for input(s): CHOL, HDL, LDLCALC, TRIG, CHOLHDL, LDLDIRECT in the last 72 hours. Thyroid Function Tests  Recent Labs  08/24/14 0430  TSH 1.351    TELE  NSR with some sinus brady and PVCs  Radiology/Studies  Ct Angio Head W/cm &/or Wo Cm  08/20/2014   CLINICAL DATA:  Left PCA infarct 2 months ago. New onset dizziness and presyncopal symptoms upon standing.  EXAM: CT ANGIOGRAPHY HEAD AND NECK  TECHNIQUE: Multidetector CT imaging of the head and neck was performed using the standard protocol during bolus administration of intravenous contrast. Multiplanar CT image reconstructions and MIPs were obtained to evaluate the vascular anatomy. Carotid stenosis measurements (when applicable) are obtained utilizing NASCET criteria, using the distal internal carotid diameter as the denominator.  CONTRAST:  161mL OMNIPAQUE IOHEXOL 350 MG/ML SOLN  COMPARISON:  None.  CT head without contrast 08/19/2014. MRI brain without contrast 06/22/2014.  FINDINGS: CT HEAD  Brain: The remote left PCA territory infarct is again seen. No acute infarct, hemorrhage, or mass lesion is present. The basal ganglia are intact. Gray-white differentiation is otherwise preserved.  Calvarium and skull base:  Negative  Paranasal sinuses: Clear  Orbits: Within normal limits.  CTA NECK  Aortic arch: A common origin of the left common carotid artery and innominate artery are noted.  Right carotid system: The right common carotid artery is within normal limits. Dense atherosclerotic calcifications are present at the aortic arch. Calcified and noncalcified plaque narrows the proximal right internal carotid artery to approximately 50% of the distal lumen. The cervical left ICA scratch the cervical right ICA is tortuous without additional stenoses.  Left carotid system: A left common carotid artery is within normal limits. Calcified and noncalcified plaque narrow the lumen of the proximal left internal carotid artery to 1.9 mm. This compares with 4.1 mm distally. There is mild tortuosity of the cervical left ICA otherwise without additional stenoses.  Vertebral arteries:The vertebral arteries originate from the subclavian arteries bilaterally. The right vertebral artery is the dominant vessel. There are no significant stenoses in the neck scratch the there are no significant vertebral artery stenoses in the neck.  Skeleton: There straightening of the normal cervical lordosis. Mild degenerative changes are present at each level from C2-3 through C6-7. Mild osseous foraminal narrowing is evident. No focal lytic or blastic lesions are present.  Other neck: Incidental note is made of or foraminal lobe of the thyroid. No discrete lesions are present within the thyroid. There is no significant adenopathy. Salivary glands are within normal limits. No focal mucosal or submucosal lesions are present.  CTA HEAD  Anterior circulation: Minimal atherosclerotic calcifications are present within the cavernous internal carotid arteries bilaterally. There is no significant stenosis. The terminal ICA is normal. A1 and M1 segments are within normal limits. The anterior communicating artery is patent. MCA bifurcations are within normal limits. ACA  and MCA branch vessels are normal.  Posterior circulation: The right vertebral artery is slightly dominant to the left. The PICA origins are visualized and normal bilaterally. The vertebrobasilar junction is within normal limits. The basilar artery is normal. Both posterior cerebral arteries originate from the basilar tip. The PCA branch vessels are unremarkable.  Venous sinuses: The dural sinuses are patent. The right transverse sinus is the dominant vessel. The left internal jugular vein and sigmoid sinus are hypoplastic. The straight sinus and deep cerebral veins are patent.  Anatomic variants: None.  Delayed phase:No pathologic enhancement is present.  IMPRESSION: 1. Stable appearance of two-month old left PCA territory infarct. 2. No evidence for acute infarct. 3. 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 4. No significant of posterior circulation stenosis identified. 5. Mild spondylosis of the cervical spine.   Electronically Signed   By: San Morelle M.D.   On: 08/20/2014 17:53   X-ray  Chest Pa And Lateral  08/24/2014   CLINICAL DATA:  Acute onset of syncope.  Initial encounter.  EXAM: CHEST  2 VIEW  COMPARISON:  Chest radiograph performed 08/18/2014  FINDINGS: The lungs are well-aerated. Mild bibasilar atelectasis is noted. There is no evidence of pleural effusion or pneumothorax.  The heart is normal in size; the mediastinal contour is within normal limits. No acute osseous abnormalities are seen.  IMPRESSION: Mild bibasilar atelectasis is noted.  Lungs otherwise clear.   Electronically Signed   By: Garald Balding M.D.   On: 08/24/2014 05:11   Dg Chest 2 View  08/18/2014   CLINICAL DATA:  Epigastric pain.  Nausea and vomiting  EXAM: CHEST  2 VIEW  COMPARISON:  None.  FINDINGS: Mild bibasilar airspace disease most consistent with atelectasis. No definite pneumonia or effusion. Negative for heart failure.  IMPRESSION: Mild bibasilar atelectasis.   Electronically  Signed   By: Franchot Gallo M.D.   On: 08/18/2014 12:33   Ct Head Wo Contrast  08/25/2014   CLINICAL DATA:  Dizziness, more severe with standing.  EXAM: CT HEAD WITHOUT CONTRAST  TECHNIQUE: Contiguous axial images were obtained from the base of the skull through the vertex without intravenous contrast.  COMPARISON:  August 20, 2014  FINDINGS: Mild diffuse atrophy is stable. There is no intracranial mass, hemorrhage, extra-axial fluid collection, or midline shift. The prior infarct involving the medial to mid left occipital lobe is stable. There is mild patchy small vessel disease in the centra semiovale bilaterally. There is no new gray-white compartment lesion. No acute infarct apparent. The bony calvarium appears intact. The mastoid air cells are clear.  IMPRESSION: Stable left occipital lobe infarct. Mild atrophy with mild periventricular small vessel disease, stable. No intracranial mass, hemorrhage, or acute appearing infarct.   Electronically Signed   By: Lowella Grip III M.D.   On: 08/25/2014 09:09   Ct Angio Neck W/cm &/or Wo/cm  08/20/2014   CLINICAL DATA:  Left PCA infarct 2 months ago. New onset dizziness and presyncopal symptoms upon standing.  EXAM: CT ANGIOGRAPHY HEAD AND NECK  TECHNIQUE: Multidetector CT imaging of the head and neck was performed using the standard protocol during bolus administration of intravenous contrast. Multiplanar CT image reconstructions and MIPs were obtained to evaluate the vascular anatomy. Carotid stenosis measurements (when applicable) are obtained utilizing NASCET criteria, using the distal internal carotid diameter as the denominator.  CONTRAST:  140mL OMNIPAQUE IOHEXOL 350 MG/ML SOLN  COMPARISON:  None.  CT head without contrast 08/19/2014. MRI brain without contrast 06/22/2014.  FINDINGS: CT HEAD  Brain: The remote left PCA territory infarct is again seen. No acute infarct, hemorrhage, or mass lesion is present. The basal ganglia are intact. Gray-white  differentiation is otherwise preserved.  Calvarium and skull base: Negative  Paranasal sinuses: Clear  Orbits: Within normal limits.  CTA NECK  Aortic arch: A common origin of the left common carotid artery and innominate artery are noted.  Right carotid system: The right common carotid artery is within normal limits. Dense atherosclerotic calcifications are present at the aortic arch. Calcified and noncalcified plaque narrows the proximal right internal carotid artery to approximately 50% of the distal lumen. The cervical left ICA scratch the cervical right ICA is tortuous without additional stenoses.  Left carotid system: A left common carotid artery is within normal limits. Calcified and noncalcified plaque narrow the lumen of the proximal left internal carotid artery to 1.9 mm. This compares with 4.1 mm distally. There is  mild tortuosity of the cervical left ICA otherwise without additional stenoses.  Vertebral arteries:The vertebral arteries originate from the subclavian arteries bilaterally. The right vertebral artery is the dominant vessel. There are no significant stenoses in the neck scratch the there are no significant vertebral artery stenoses in the neck.  Skeleton: There straightening of the normal cervical lordosis. Mild degenerative changes are present at each level from C2-3 through C6-7. Mild osseous foraminal narrowing is evident. No focal lytic or blastic lesions are present.  Other neck: Incidental note is made of or foraminal lobe of the thyroid. No discrete lesions are present within the thyroid. There is no significant adenopathy. Salivary glands are within normal limits. No focal mucosal or submucosal lesions are present.  CTA HEAD  Anterior circulation: Minimal atherosclerotic calcifications are present within the cavernous internal carotid arteries bilaterally. There is no significant stenosis. The terminal ICA is normal. A1 and M1 segments are within normal limits. The anterior  communicating artery is patent. MCA bifurcations are within normal limits. ACA and MCA branch vessels are normal.  Posterior circulation: The right vertebral artery is slightly dominant to the left. The PICA origins are visualized and normal bilaterally. The vertebrobasilar junction is within normal limits. The basilar artery is normal. Both posterior cerebral arteries originate from the basilar tip. The PCA branch vessels are unremarkable.  Venous sinuses: The dural sinuses are patent. The right transverse sinus is the dominant vessel. The left internal jugular vein and sigmoid sinus are hypoplastic. The straight sinus and deep cerebral veins are patent.  Anatomic variants: None.  Delayed phase:No pathologic enhancement is present.  IMPRESSION: 1. Stable appearance of two-month old left PCA territory infarct. 2. No evidence for acute infarct. 3. 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 4. No significant of posterior circulation stenosis identified. 5. Mild spondylosis of the cervical spine.   Electronically Signed   By: San Morelle M.D.   On: 08/20/2014 17:53   Ct Abdomen Pelvis W Contrast  08/18/2014   CLINICAL DATA:  64 year old female with epigastric abdominal pain for 3 days with nausea and vomiting. Initial encounter.  EXAM: CT ABDOMEN AND PELVIS WITH CONTRAST  TECHNIQUE: Multidetector CT imaging of the abdomen and pelvis was performed using the standard protocol following bolus administration of intravenous contrast.  CONTRAST:  69mL OMNIPAQUE IOHEXOL 300 MG/ML  SOLN  COMPARISON:  CT Abdomen and Pelvis 04/11/2014.  FINDINGS: Cardiomegaly. No pericardial or pleural effusion. Improved ventilation at the lung bases with less ground-glass and mosaic attenuation. Mild dependent atelectasis now.  Degenerative changes in the spine. No acute osseous abnormality identified.  No pelvic free fluid.  Negative rectum.  Negative uterus and adnexa.  Severe bladder distension.  Estimated bladder volume 588 mL. No bladder wall thickening or perivesical stranding.  Negative sigmoid colon. Decompressed and negative left colon. Negative transverse colon with gas and stool. Redundant hepatic flexure. Stool and oral contrast in the right colon. Normal appendix. Negative terminal ileum. No dilated small bowel. Decompressed stomach and duodenum.  Liver, gallbladder, spleen, pancreas and adrenal glands are within normal limits. The portal venous system is patent. Aortoiliac calcified atherosclerosis noted. Major arterial structures are patent. No abdominal free fluid.  Chronic hydronephrosis and extra renal pelvis. Symmetric renal contrast enhancement and excretion. Bilateral hydroureter without periureteral stranding or urologic calculus. No lymphadenopathy.  IMPRESSION: 1. Severe bladder distension (estimated bladder volume 588 mL) is felt responsible for bilateral hydroureter and hydronephrosis. Query bladder outlet obstruction. 2. No other acute or  inflammatory process in the abdomen or pelvis. Normal appendix.   Electronically Signed   By: Genevie Ann M.D.   On: 08/18/2014 16:00   Mr Attempted Daymon Larsen Report  08/20/2014   This examination belongs to an outside facility and is stored  here for comparison purposes only.  Contact the originating outside  institution for any associated report or interpretation.  Ct Head Limited W/o Cm  08/19/2014   CLINICAL DATA:  Dizzy spells for 2 weeks. Presyncope. Recent cerebral infarction.  EXAM: CT HEAD WITHOUT CONTRAST  TECHNIQUE: Contiguous axial images were obtained from the base of the skull through the vertex without intravenous contrast.  COMPARISON:  MR brain 06/22/2014. CT head 06/22/2014. Most recent CT 07/24/2014.  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 No new areas of cerebral infarction are definitely identified.  No hemorrhage, mass lesion,  hydrocephalus, or extra-axial fluid. Mild atrophy and small vessel disease. Calvarium intact. No sinus or mastoid disease. No change from March scan.  IMPRESSION: Chronic LEFT occipital infarct.  No acute features are evident.   Electronically Signed   By: Rolla Flatten M.D.   On: 08/19/2014 13:39    ASSESSMENT AND PLAN  64 YO female with a history of HTN, recent PCA stroke (~2 mo ago) and DM who was readmitted to [facility] on 08/23/14 with multiple recurrent episodes of syncope/presyncopal events. She was recently discharged on 08/22/14 for similar episodes.   Syncope-  Seen by neurology. EEG obtained and showed no epileptiform activity.Multiple orthostatic BP showed elevation of SBP while standing and no orthostasis. MRI was requested but patient refused. CT head was obtained and negative. She has had no further events while in hospital. -- Her rhythm has been sinus bradycardia with occasional PVCs. She has normal LV function. She is otherwise asymptomatic. Will arranged an event monitor.  Judy Pimple PA-C  Pager [phone]  Patient seen, examined. Available data reviewed. Agree with findings, assessment, and plan as outlined by Angelena Form, PA-C. The patient is independently interviewed and examined. Lungs are clear. Heart is regular rate and rhythm without murmur. There is no peripheral edema. I have reviewed her diagnostic studies and telemetry. Orthostatic vital signs are normal. I do not see any clear cardiac cause of her dizziness/syncope. I think an event monitor is reasonable as has been suggested. This will be arranged. BP has been elevated - pt has a wide pulse pressure. She previously had orthostasis on more aggressive antihypertensive Rx. I think best to continue amlodipine 10 mg daily without further escalation in Rx at this time.  The patient is concerned about returning home after this hospitalization. She requests an evaluation for placement at [facility] skilled  nursing facility. Will consult the case manager. Please call if any other cardiac issues arise.  Sherren Mocha, M.D. 08/25/2014 11:26 AM

## 2014-08-25 NOTE — Progress Notes (Signed)
UR completed 

## 2014-08-25 NOTE — Progress Notes (Signed)
Pt is not eligible for SNF- does not have 3 night inpatient qualifying stay for Medicare coverage.  Pt is able to stay with her brother/sister in law at time of DC and feels safe with this plan.  CSW signing off.  Domenica Reamer, [place] Social Worker [phone]

## 2014-08-26 ENCOUNTER — Telehealth: Payer: Self-pay | Admitting: Physician Assistant

## 2014-08-26 DIAGNOSIS — R42 Dizziness and giddiness: Secondary | ICD-10-CM | POA: Diagnosis not present

## 2014-08-26 DIAGNOSIS — I10 Essential (primary) hypertension: Secondary | ICD-10-CM | POA: Diagnosis not present

## 2014-08-26 DIAGNOSIS — R001 Bradycardia, unspecified: Secondary | ICD-10-CM | POA: Diagnosis not present

## 2014-08-26 DIAGNOSIS — N183 Chronic kidney disease, stage 3 (moderate): Secondary | ICD-10-CM | POA: Diagnosis not present

## 2014-08-26 LAB — CBC WITH DIFFERENTIAL/PLATELET
BASOS ABS: 0 10*3/uL (ref 0.0–0.1)
Basophils Relative: 1 % (ref 0–1)
Eosinophils Absolute: 0.1 10*3/uL (ref 0.0–0.7)
Eosinophils Relative: 3 % (ref 0–5)
HEMATOCRIT: 43.6 % (ref 36.0–46.0)
HEMOGLOBIN: 14.3 g/dL (ref 12.0–15.0)
LYMPHS ABS: 1.5 10*3/uL (ref 0.7–4.0)
Lymphocytes Relative: 45 % (ref 12–46)
MCH: 31.6 pg (ref 26.0–34.0)
MCHC: 32.8 g/dL (ref 30.0–36.0)
MCV: 96.2 fL (ref 78.0–100.0)
Monocytes Absolute: 0.4 10*3/uL (ref 0.1–1.0)
Monocytes Relative: 10 % (ref 3–12)
NEUTROS ABS: 1.4 10*3/uL — AB (ref 1.7–7.7)
NEUTROS PCT: 41 % — AB (ref 43–77)
Platelets: 186 10*3/uL (ref 150–400)
RBC: 4.53 MIL/uL (ref 3.87–5.11)
RDW: 12.2 % (ref 11.5–15.5)
WBC: 3.4 10*3/uL — AB (ref 4.0–10.5)

## 2014-08-26 LAB — COMPREHENSIVE METABOLIC PANEL
ALBUMIN: 3.5 g/dL (ref 3.5–5.2)
ALT: 18 U/L (ref 0–35)
AST: 16 U/L (ref 0–37)
Alkaline Phosphatase: 49 U/L (ref 39–117)
Anion gap: 11 (ref 5–15)
BUN: 16 mg/dL (ref 6–23)
CO2: 22 mmol/L (ref 19–32)
CREATININE: 1.38 mg/dL — AB (ref 0.50–1.10)
Calcium: 9.6 mg/dL (ref 8.4–10.5)
Chloride: 106 mmol/L (ref 96–112)
GFR calc non Af Amer: 39 mL/min — ABNORMAL LOW (ref >90)
GFR, EST AFRICAN AMERICAN: 45 mL/min — AB (ref >90)
Glucose, Bld: 75 mg/dL (ref 70–99)
Potassium: 4 mmol/L (ref 3.5–5.1)
Sodium: 139 mmol/L (ref 135–145)
TOTAL PROTEIN: 6.6 g/dL (ref 6.0–8.3)
Total Bilirubin: 0.6 mg/dL (ref 0.3–1.2)

## 2014-08-26 LAB — GLUCOSE, CAPILLARY
GLUCOSE-CAPILLARY: 70 mg/dL (ref 70–99)
Glucose-Capillary: 82 mg/dL (ref 70–99)
Glucose-Capillary: 89 mg/dL (ref 70–99)
Glucose-Capillary: 90 mg/dL (ref 70–99)
Glucose-Capillary: 97 mg/dL (ref 70–99)
Glucose-Capillary: 98 mg/dL (ref 70–99)

## 2014-08-26 LAB — URINE CULTURE: Colony Count: >=100000

## 2014-08-26 MED ORDER — ACETAMINOPHEN 325 MG PO TABS
650.0000 mg | ORAL_TABLET | Freq: Four times a day (QID) | ORAL | Status: DC | PRN
  Administered 2014-08-26: 650 mg via ORAL
  Filled 2014-08-26: qty 2

## 2014-08-26 NOTE — Progress Notes (Signed)
UR completed 

## 2014-08-26 NOTE — Telephone Encounter (Signed)
Ok,noted
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
## 2014-08-26 NOTE — Clinical Social Work Note (Signed)
CSW spoke to patient's son to discuss discharge plan.  CSW explained to patient's son that if she needs an ambulance transport, CSW can set it up unless someone will be able to pick her up.  Patient's son stated that patient will be going to brother in [place].  CSW will set up ambulance transport once patient is ready to discharge, son is going to call once a time is determined.  Jones Broom. [place], MSW, [place] 08/26/2014 10:52 AM

## 2014-08-26 NOTE — Discharge Instructions (Signed)
Follow with Primary MD [name],[name] in 7 days   Get CBC, CMP, 2 view Chest X ray checked  by Primary MD next visit.    Activity: As tolerated with Full fall precautions use walker/cane & assistance as needed   Disposition Home    Diet: Heart Healthy   For Heart failure patients - Check your Weight same time everyday, if you gain over 2 pounds, or you develop in leg swelling, experience more shortness of breath or chest pain, call your Primary MD immediately. Follow Cardiac Low Salt Diet and 1.5 lit/day fluid restriction.   On your next visit with your primary care physician please Get Medicines reviewed and adjusted.   Please request your Prim.MD to go over all Hospital Tests and Procedure/Radiological results at the follow up, please get all Hospital records sent to your Prim MD by signing hospital release before you go home.   If you experience worsening of your admission symptoms, develop shortness of breath, life threatening emergency, suicidal or homicidal thoughts you must seek medical attention immediately by calling 911 or calling your MD immediately  if symptoms less severe.  You Must read complete instructions/literature along with all the possible adverse reactions/side effects for all the Medicines you take and that have been prescribed to you. Take any new Medicines after you have completely understood and accpet all the possible adverse reactions/side effects.   Do not drive, operating heavy machinery, perform activities at heights, swimming or participation in water activities or provide baby sitting services if your were admitted for syncope or siezures until you have seen by Primary MD or a Neurologist and advised to do so again.  Do not drive when taking Pain medications.    Do not take more than prescribed Pain, Sleep and Anxiety Medications  Special Instructions: If you have smoked or chewed Tobacco  in the last 2 yrs please stop smoking, stop any regular  Alcohol  and or any Recreational drug use.  Wear Seat belts while driving.   Please note  You were cared for by a hospitalist during your hospital stay. If you have any questions about your discharge medications or the care you received while you were in the hospital after you are discharged, you can call the unit and asked to speak with the hospitalist on call if the hospitalist that took care of you is not available. Once you are discharged, your primary care physician will handle any further medical issues. Please note that NO REFILLS for any discharge medications will be authorized once you are discharged, as it is imperative that you return to your primary care physician (or establish a relationship with a primary care physician if you do not have one) for your aftercare needs so that they can reassess your need for medications and monitor your lab values.

## 2014-08-26 NOTE — Care Management Note (Unsigned)
    Page 1 of 1   08/26/2014     10:55:23 AM CARE MANAGEMENT NOTE 08/26/2014  Patient:  Leslie Houston, Leslie Houston   Account Number:  [id number]  Date Initiated:  08/26/2014  Documentation initiated by:  Elenor Quinones  Subjective/Objective Assessment:   Pt admitted with syncopal episode     Action/Plan:   Plan is discharge to brother-in-laws home that will provide out of bed mobility supervison as ordered.  Pt will go home with [facility] RN,PT and aide   Anticipated DC Date:  08/26/2014   Anticipated DC Plan:  [place]  CM consult      Choice offered to / List presented to:  C-1 Patient        [facility] arranged  HH-1 RN  [facility]      [facility] [place].   Status of service:  Completed, signed off Medicare Important Message given?  NO (If response is "NO", the following Medicare IM given date fields will be blank) Date Medicare IM given:   Medicare IM given by:   Date Additional Medicare IM given:   Additional Medicare IM given by:    Discharge Disposition:  [place]  Per UR Regulation:    If discussed at Long Length of Stay Meetings, dates discussed:    Comments:  08/26/14 Elenor Quinones, RN, BSN [phone]  PT did not recommend any DME.  CM offered [facility] choice to pt, pt selected [facility].  CM spoke with pts son Rhea Belton regarding where pt would reside post discharge.  Pt son informed CM that pt would go to brother-in-law home post discharge and there she would have supervision as recommended by PT. Post discharge address will be [address], [place], [place] [address] contact number remains [phone].  CM contacted Butch Penny with [facility]: referral was accepted, discharge address given,contact information for son given, informed  of expected dishcarge today.  No additional CM needs at this time.

## 2014-08-26 NOTE — Progress Notes (Signed)
Pt/family given discharge instructions, medication lists, follow up appointments, and when to call the doctor.  Pt/family verbalizes understanding. Pt given release of information sheet to take to primary care doctor.  Patient did not know address/location or phone number for primary care doctor.  Patient state that she just started going there and did not know if name was correct.  I spoke with son on phone to make aware that I would not be able to given to medical records until missing information was added.  Pt/son agree to give to primary care doctor and fill in information. Payton Emerald, RN

## 2014-08-26 NOTE — Telephone Encounter (Signed)
New Message        TCM appt on 09/03/14 at 9:30 w/ Richardson Dopp.

## 2014-08-28 DIAGNOSIS — R339 Retention of urine, unspecified: Secondary | ICD-10-CM | POA: Diagnosis not present

## 2014-08-28 DIAGNOSIS — I5030 Unspecified diastolic (congestive) heart failure: Secondary | ICD-10-CM | POA: Diagnosis not present

## 2014-08-28 DIAGNOSIS — Z87891 Personal history of nicotine dependence: Secondary | ICD-10-CM | POA: Diagnosis not present

## 2014-08-28 DIAGNOSIS — I252 Old myocardial infarction: Secondary | ICD-10-CM | POA: Diagnosis not present

## 2014-08-28 DIAGNOSIS — I69351 Hemiplegia and hemiparesis following cerebral infarction affecting right dominant side: Secondary | ICD-10-CM | POA: Diagnosis not present

## 2014-08-28 DIAGNOSIS — R42 Dizziness and giddiness: Secondary | ICD-10-CM | POA: Diagnosis not present

## 2014-08-28 DIAGNOSIS — N183 Chronic kidney disease, stage 3 (moderate): Secondary | ICD-10-CM | POA: Diagnosis not present

## 2014-08-28 DIAGNOSIS — I129 Hypertensive chronic kidney disease with stage 1 through stage 4 chronic kidney disease, or unspecified chronic kidney disease: Secondary | ICD-10-CM | POA: Diagnosis not present

## 2014-08-28 DIAGNOSIS — I2510 Atherosclerotic heart disease of native coronary artery without angina pectoris: Secondary | ICD-10-CM | POA: Diagnosis not present

## 2014-08-29 NOTE — Consult Note (Signed)
PATIENT NAME:  Leslie Houston, STADTLER MR#:  [id number] DATE OF BIRTH:  [birth year]  DATE OF CONSULTATION:  06/23/2014  CONSULTING PHYSICIAN:  Leotis Pain, MD  Been up and year-old MR # colon six 64 year old African American female with past medical history of hypertension, diabetes, presents to Emergency Department with right visual loss.  The patient outside of window for TPA, which was not given status post imaging. The patient has left PCA infarct with consistent right homonymous hemianopsia. No focal deficits.   PAST MEDICAL HISTORY: Diastolic congestive heart failure, hypertension, coronary artery disease and diabetes.   SURGERIES:  None.   ALLERGIES: No known allergies.   SOCIAL HISTORY: Currently does not smoke. No EtOH use.   FAMILY HISTORY: Significant for colon cancer in the father.   HOME MEDICATIONS:  Have been reviewed.    REVIEW OF SYSTEMS:  No shortness of breath. No chest pain. No abdominal pain. No weakness on one side of the body compared to the other. Right homonymous hemianopsia.   NEUROLOGICAL EVALUATION: The patient is alert, awake and oriented to time, place, location and the reason why she is in the hospital. Extraocular movements are intact. Facial sensation intact. Facial motor is intact. Tongue is midline. Right homonymous hemianopsia present on examination. Motor five out of five bilateral upper and lower extremities. Sensation intact. Coordination: Finger-to-nose intact.   IMPRESSION: A 64 year old female with hypertension, diabetes presenting with right homonymous hemianopsia. The patient has a left PCA infarct. The patient was not on aspirin at home.   PLAN: Aspirin statin, this patient is ordered blood pressure control, physical therapy, occupational therapy. Discharge planning likely tomorrow.   Thank you; it was a pleasure seeing this patient. Please call me with questions.    ____________________________ Leotis Pain, MD yz:at D: 06/23/2014 17:05:37  ET T: 06/23/2014 19:38:26 ET JOB#: [id number]  cc: Leotis Pain, MD, <Dictator> Leotis Pain MD ELECTRONICALLY SIGNED 07/15/2014 12:06

## 2014-08-29 NOTE — Discharge Summary (Signed)
PATIENT NAME:  Leslie, Houston MR#:  [id number] DATE OF BIRTH:  Feb 02, 1951  DATE OF ADMISSION:  06/22/2014 DATE OF DISCHARGE:  06/24/2014  PRESENTING COMPLAINT: Visual disturbance, right eye.   DISCHARGE DIAGNOSES: 1.  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  2.  Hypertension.   CONDITION ON DISCHARGE: Fair.   CODE STATUS: FULL.  DISCHARGE MEDICATIONS: 1.  Hydralazine 50 mg t.i.d.  2.  Metoprolol 25 mg p.o. daily.  3.  Amlodipine 10 mg daily.  4.  Imdur 30 mg daily.  5.  Meclizine 25 mg 1 tablet 3 times a day as needed.  6.  Lovastatin 40 mg daily.  7.  Aspirin 81 mg daily.  8.  Cipro 250 p.o. b.i.d.  9.  Acetaminophen 325 two tablets every 4 hours as needed.   DISCHARGE INSTRUCTIONS: 1.  Follow up with your primary care physician at [facility].  2.  Outpatient physical therapy.   DIAGNOSTIC DATA: Echo Doppler showed EF of 60% to 65%. No source of CVA or TIA detected. Normal LVE function.   MRI of the brain showed acute infarct, left PCA territory, involving inferior optic tract and occipital lobe.   Ultrasound carotid Doppler showed mild bilateral carotid bifurcation plaque resulting in less than 50% diameter stenosis.   CBC within normal limits.   BRIEF SUMMARY OF HOSPITAL COURSE: Leslie Houston is a 64 year old African American female with past medical history of hypertension who comes to the Emergency Room with right eye visual disturbance. She was admitted with:  1.  Acute left PCA territory stroke with right-sided weakness, numbness and right vision loss. The patient was started on aspirin and statin. Blood pressure medication was held for permissive hypertension, however resumed at discharge. Neurology consultation was obtained. MRI results as above. The patient was not a TPA candidate since symptoms were more than 24 hours old.  2.  Hypertension. The patient's medications were held.  3.  Type 2 diabetes, borderline. The  patient's A1c is 4.9. She is not on any medications.  4.  Bacteriuria. Cipro was given for 3 days.   Overall hospital stay otherwise remained stable. She remained a FULL code.   TIME SPENT: 40 minutes.  ____________________________ Hart Rochester Posey Pronto, MD sap:sb D: 06/25/2014 07:09:58 ET T: 06/25/2014 16:28:21 ET JOB#: [id number]  cc: Toddy Boyd A. Posey Pronto, MD, <Dictator> [facility] Ilda Basset MD ELECTRONICALLY SIGNED 06/29/2014 17:37

## 2014-08-29 NOTE — H&P (Signed)
PATIENT NAME:  Leslie Houston, Leslie Houston MR#:  [id number] DATE OF BIRTH:  [birth year]  DATE OF ADMISSION:  06/22/2014  PRIMARY CARE PROVIDER: Bobetta Lime, MD   CHIEF COMPLAINT: Right-sided vision loss and right-sided weakness with numbness.   HISTORY OF PRESENTING ILLNESS: A 64 year old African American female patient with history of hypertension, diabetes, presents to the Emergency Room complaining of one day of right-sided vision loss and right-sided weakness with numbness. The patient had her symptoms yesterday. She was hoping this would improve but today, presented here with her sister-in-law. The patient also has some dysarthria. This was acute in onset, never had similar symptoms in the past. At this point, the patient is unable to ambulate. A CT scan of the head shows left occipital acute CVA. The patient has no problems with swallowing.   PAST MEDICAL HISTORY: 1. Diastolic congestive heart failure.  2. Hypertension.  3. Mild CAD without PCI.  4. Borderline diabetes mellitus.   PAST SURGICAL HISTORY: None.   ALLERGIES: No known drug allergies.   SOCIAL HISTORY: The patient used to smoke in the past, quit in December 2015. Does not drink any alcohol. No illicit drug use. Occasionally uses a cane.   CODE STATUS: Full code.   FAMILY HISTORY: Father died of colon cancer, also had prostate cancer. Mother had stroke and heart disease.   HOME MEDICATIONS: 1. Amlodipine 10 mg daily.  2. Hydralazine 50 mg oral 3 times a day.  3. Isosorbide mononitrate 30 mg daily.  4. Meclizine 25 mg oral 3 times a day as needed.  5. Metoprolol 25 mg extended-release daily.   REVIEW OF SYSTEMS: CONSTITUTIONAL: No fever or fatigue.  EYES: Has loss of right vision.  EARS, NOSE, AND THROAT: No tinnitus, ear pain, hearing loss.  RESPIRATORY: No cough, wheeze, hemoptysis.  CARDIOVASCULAR: No chest pain, orthopnea, edema.  GASTROINTESTINAL: No nausea, vomiting, diarrhea, abdominal pain.  GENITOURINARY: No  dysuria, hematuria, or frequency. ENDOCRINE: No polyuria, nocturia, or thyroid problems.  HEMATOLOGIC AND LYMPHATIC: No anemia, easy bruising, bleeding. INTEGUMENTARY: No acne, rash, lesion.  MUSCULOSKELETAL: Has some arthritis.  NEUROLOGIC: Has right-sided numbness, weakness.  PSYCHIATRIC: No anxiety or depression.   PHYSICAL EXAMINATION: VITAL SIGNS: Temperature of 98.2, blood pressure 180/90, saturating 98% on room air, pulse of 49.  GENERAL: Obese African American female patient lying in bed, tearful, anxious.  PSYCHIATRIC: Alert and oriented x3, but anxious.  HEENT: Atraumatic, normocephalic. Oral mucosa moist and pink. External ears and nose normal. No pallor. No icterus. Pupils are bilaterally equal and reactive to light.  NECK: Supple. No thyromegaly. No palpable lymph nodes. Trachea midline. No carotid bruit or JVD.  CARDIOVASCULAR: S1, S2, without any murmurs. Peripheral pulses 2+. No edema.  RESPIRATORY: Normal work of breathing. Clear to auscultation on both sides.  GASTROINTESTINAL: Soft abdomen, nontender. Bowel sounds present. No hepatosplenomegaly palpable.  GENITOURINARY: No CVA tenderness or bladder distention.  SKIN: Warm and dry. No petechiae, rash, ulcers.  MUSCULOSKELETAL: No joint swelling, redness, effusion of the large joints. Normal muscle tone.  NEUROLOGICAL: Motor strength is 3/5 In right upper lower extremity and 4/5 in right upper extremity. Decreased sensations on the right side all over. Has right-sided vision loss. Other cranial nerves normal. Gait not tested.  LYMPHATIC: No cervical lymphadenopathy.   LABORATORY STUDIES AND IMAGING STUDIES: CT scan of the head shows A 3 cm area of left occipital lobe acute CVA.   Glucose 100, BUN 20, creatinine 1.27. AST, ALT, alkaline phosphatase, bilirubin normal. Troponin less than 0.02. WBC 4,  hemoglobin 13.3, platelets 224,000, INR 1.1. Urinalysis shows 3+ bacteria and 30 WBC.    EKG shows normal sinus rhythm with A  first-degree AV block. No acute ST changes.   ASSESSMENT AND PLAN: 1. Acute left occipital cerebrovascular accident with right-sided weakness, numbness, and right vision loss. The patient will be admitted onto telemetry floor. We will get an echocardiogram, carotid Dopplers. Start her on aspirin, statin. Hold blood pressure medications for permissive hypertension. The patient will be on neurological checks, telemetry. Get an MRI of the brain. Consult neurology. The patient is not a candidate for tPA, as her symptom onset is over 12 hours at this point. Consult physical therapy. The patient does not have any swallowing issues. Discharge planning.  2. Hypertension. Hold medications for permissive hypertension.  3. Diastolic congestive heart failure is stable. No signs of fluid overload.  4. Bacteriuria. The patient does not have any urinary symptoms. Has 3+ bacteria and 30 WBCs. We will send for cultures. No fever. Normal white count.  5. Borderline diabetes. She is not on any medications. We will put her on sliding scale insulin. Check Hb A1c  . Start medications if needed.  6. Deep vein thrombosis prophylaxis with Lovenox.   CODE STATUS: Full code.   TIME SPENT ON THIS CASE: 45 minutes.    ____________________________ Leia Alf Hiroyuki Ozanich, MD srs:mw D: 06/22/2014 12:02:08 ET T: 06/22/2014 12:20:33 ET JOB#: [id number]  cc: Alveta Heimlich R. Jeanclaude Wentworth, MD, <Dictator> Bobetta Lime, MD Neita Carp MD ELECTRONICALLY SIGNED 06/22/2014 19:12

## 2014-08-30 DIAGNOSIS — N183 Chronic kidney disease, stage 3 (moderate): Secondary | ICD-10-CM | POA: Diagnosis not present

## 2014-08-30 DIAGNOSIS — R42 Dizziness and giddiness: Secondary | ICD-10-CM | POA: Diagnosis not present

## 2014-08-30 DIAGNOSIS — I129 Hypertensive chronic kidney disease with stage 1 through stage 4 chronic kidney disease, or unspecified chronic kidney disease: Secondary | ICD-10-CM | POA: Diagnosis not present

## 2014-08-30 DIAGNOSIS — I5030 Unspecified diastolic (congestive) heart failure: Secondary | ICD-10-CM | POA: Diagnosis not present

## 2014-08-30 DIAGNOSIS — I69351 Hemiplegia and hemiparesis following cerebral infarction affecting right dominant side: Secondary | ICD-10-CM | POA: Diagnosis not present

## 2014-08-30 DIAGNOSIS — R339 Retention of urine, unspecified: Secondary | ICD-10-CM | POA: Diagnosis not present

## 2014-08-31 DIAGNOSIS — I129 Hypertensive chronic kidney disease with stage 1 through stage 4 chronic kidney disease, or unspecified chronic kidney disease: Secondary | ICD-10-CM | POA: Diagnosis not present

## 2014-08-31 DIAGNOSIS — R339 Retention of urine, unspecified: Secondary | ICD-10-CM | POA: Diagnosis not present

## 2014-08-31 DIAGNOSIS — R42 Dizziness and giddiness: Secondary | ICD-10-CM | POA: Diagnosis not present

## 2014-08-31 DIAGNOSIS — N183 Chronic kidney disease, stage 3 (moderate): Secondary | ICD-10-CM | POA: Diagnosis not present

## 2014-08-31 DIAGNOSIS — I69351 Hemiplegia and hemiparesis following cerebral infarction affecting right dominant side: Secondary | ICD-10-CM | POA: Diagnosis not present

## 2014-08-31 DIAGNOSIS — I5030 Unspecified diastolic (congestive) heart failure: Secondary | ICD-10-CM | POA: Diagnosis not present

## 2014-09-02 DIAGNOSIS — R42 Dizziness and giddiness: Secondary | ICD-10-CM | POA: Diagnosis not present

## 2014-09-02 DIAGNOSIS — I69351 Hemiplegia and hemiparesis following cerebral infarction affecting right dominant side: Secondary | ICD-10-CM | POA: Diagnosis not present

## 2014-09-02 DIAGNOSIS — N183 Chronic kidney disease, stage 3 (moderate): Secondary | ICD-10-CM | POA: Diagnosis not present

## 2014-09-02 DIAGNOSIS — I129 Hypertensive chronic kidney disease with stage 1 through stage 4 chronic kidney disease, or unspecified chronic kidney disease: Secondary | ICD-10-CM | POA: Diagnosis not present

## 2014-09-02 DIAGNOSIS — R339 Retention of urine, unspecified: Secondary | ICD-10-CM | POA: Diagnosis not present

## 2014-09-02 DIAGNOSIS — I5030 Unspecified diastolic (congestive) heart failure: Secondary | ICD-10-CM | POA: Diagnosis not present

## 2014-09-02 NOTE — Progress Notes (Signed)
Cardiology Office Note   Date:  09/03/2014   ID:  Leslie Houston, DOB 12-01-1950, MRN [id number]  Patient Care Team: Bobetta Lime, MD as PCP - General [name], MD as Consulting Physician (Urology) Lavonia Dana, MD as Referring Physician (Nephrology)   Cardiologist:  Dr. Quay Burow  >> will FU in [place] office (lives in [place])    Chief Complaint  Patient presents with  . Hospitalization Follow-up    Admitted 4/25-4/28 with recurrent syncope     History of Present Illness: Leslie Houston is a 64 y.o. female with a hx of recent CVA (PCA) 05/2014, recurrent syncope, HTN, orthostatic hypotension, carotid stenosis, CKD, DM2, PUD.  She had an admission 4/21-4/24 with near syncope thought to be related to orthostatic BP drop.  Admitted again 4/25-4/28 with syncope.  Prior Echo 4/24 demonstrated normal LVF.  Orthostatics were normal.  She was seen by neuro due to question of seizure like activity at the time of her syncope.  EEG was neg.  She was noted to be bradycardic.  Seen by cardiology.  Her beta-blocker was stopped.  No clear cardiac cause was noted.  She was to be set up for an OP event monitor.  She returns for FU.  She is here with her friend and sister-in-law. She is doing well since discharge. She denies chest pain, shortness of breath, orthopnea, PND or edema. She denies any further syncope. She has occasional dizziness. She denies orthostatic intolerance. She denies spinning.  She denies near syncope.    Studies/Reports Reviewed Today:  Echo 08/22/14 - EF 55% to 60%. Wall motion was normal; Grade 2 diastolic dysfunction). - Left atrium: The atrium was mildly to moderately dilated.  Head and Neck CTA 08/20/14 IMPRESSION: 1. Stable appearance of two-month old left PCA territory infarct. 2. No evidence for acute infarct. 3. 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 4. No significant of posterior circulation stenosis  identified. 5. Mild spondylosis of the cervical spine.   Past Medical History  Diagnosis Date  . Stroke   . CHF (congestive heart failure)   . Diabetes mellitus without complication   . MI (myocardial infarction)     Past Surgical History  Procedure Laterality Date  . Cesarean section       Current Outpatient Prescriptions  Medication Sig Dispense Refill  . amLODipine (NORVASC) 10 MG tablet Take 10 mg by mouth daily.    [name] aspirin EC 81 MG tablet Take 81 mg by mouth daily.    . fluticasone (FLONASE) 50 MCG/ACT nasal spray Place 2 sprays into both nostrils daily as needed for allergies (allergies).     . hydrALAZINE (APRESOLINE) 10 MG tablet Take 1 tablet (10 mg total) by mouth 3 (three) times daily. 90 tablet 0  . isosorbide mononitrate (IMDUR) 30 MG 24 hr tablet Take 30 mg by mouth daily.    [name] loratadine (CLARITIN) 10 MG tablet Take 10 mg by mouth daily.    [name] lovastatin (MEVACOR) 40 MG tablet Take 40 mg by mouth at bedtime.    . ondansetron (ZOFRAN) 4 MG tablet Take 4 mg by mouth every 8 (eight) hours as needed for nausea or vomiting (nausea).      No current facility-administered medications for this visit.    Allergies:   Review of patient's allergies indicates no known allergies.    Social History:  The patient  reports that she quit smoking about 5 months ago. She does not have any smokeless tobacco history  on file. She reports that she does not drink alcohol or use illicit drugs.   Family History:  The patient's family history includes Diabetes Mellitus II in her daughter; Heart attack in her mother; Prostate cancer in her father; Stroke in her father and mother.    ROS:   Please see the history of present illness.   Review of Systems  Psychiatric/Behavioral: The patient is nervous/anxious.   All other systems reviewed and are negative.    PHYSICAL EXAM: VS:  BP 142/68 mmHg  Pulse 68  Ht 5\' 1"  (1.549 m)  Wt 170 lb (77.111 kg)  BMI 32.14 kg/m2    Wt Readings  from Last 3 Encounters:  09/03/14 170 lb (77.111 kg)  08/26/14 167 lb 8.8 oz (76 kg)  08/19/14 174 lb (78.926 kg)     GEN: Well nourished, well developed, in no acute distress HEENT: normal Neck: no JVD,  no masses Cardiac:  Normal S1/S2, RRR; no murmur ,  no rubs or gallops, no edema  Respiratory:  clear to auscultation bilaterally, no wheezing, rhonchi or rales. GI: soft, nontender, nondistended, + BS MS: no deformity or atrophy Skin: warm and dry  Neuro:  CNs II-XII intact, Strength and sensation are intact Psych: Normal affect   EKG:  EKG is ordered today.  It demonstrates:   NSR, HR 68, first-degree AV block, PR 212 ms, normal axis, LVH, inferolateral T-wave inversions, no change since prior tracing   Recent Labs: 08/24/2014: TSH 1.351 08/26/2014: ALT 18; BUN 16; Creatinine 1.38*; Hemoglobin 14.3; Platelets 186; Potassium 4.0; Sodium 139    Lipid Panel No results found for: CHOL, TRIG, HDL, CHOLHDL, VLDL, LDLCALC, LDLDIRECT    ASSESSMENT AND PLAN:  Syncope, unspecified syncope type Etiology not clear. She does not drive. Echocardiogram demonstrated normal LV function. Event monitor will be arranged as planned.  Bradycardia Heart rate is normal on ECG today. Event monitor will be arranged as outlined above. She is no longer on beta blocker therapy.  Essential hypertension Borderline control. Blood pressure tends to go up in the doctor's office. Her blood pressures at home are optimal.  History of stroke She has residual right temporal visual field deficit. She has home health physical therapy coming to her home.  Continue aspirin, statin.  CKD (chronic kidney disease) stage 3, GFR 30-59 ml/min Recent creatinine 1.38.  Carotid stenosis, bilateral  She will need follow-up carotid Dopplers in approximately 1 year. This can be arranged at her follow-up visit.  Current medicines are reviewed at length with the patient today.  Concerns regarding medicines are as outlined  above.  The following changes have been made:    None    Labs/ tests ordered today include:  Orders Placed This Encounter  Procedures  . Cardiac event monitor  . EKG 12-Lead    Disposition:   FU with Dr. Ida Rogue or Dr. Kathlyn Sacramento in [place] in 6 weeks.,    Signed, Versie Starks, MHS 09/03/2014 9:43 AM    [facility] [place], [place], [place]  [address] Phone: [phone]; Fax: [phone]

## 2014-09-03 ENCOUNTER — Encounter: Payer: Self-pay | Admitting: Physician Assistant

## 2014-09-03 ENCOUNTER — Ambulatory Visit (INDEPENDENT_AMBULATORY_CARE_PROVIDER_SITE_OTHER): Payer: Medicare Other | Admitting: Physician Assistant

## 2014-09-03 ENCOUNTER — Ambulatory Visit (INDEPENDENT_AMBULATORY_CARE_PROVIDER_SITE_OTHER): Payer: Medicare Other

## 2014-09-03 VITALS — BP 142/68 | HR 68 | Ht 61.0 in | Wt 170.0 lb

## 2014-09-03 DIAGNOSIS — Z8673 Personal history of transient ischemic attack (TIA), and cerebral infarction without residual deficits: Secondary | ICD-10-CM

## 2014-09-03 DIAGNOSIS — R001 Bradycardia, unspecified: Secondary | ICD-10-CM

## 2014-09-03 DIAGNOSIS — I6523 Occlusion and stenosis of bilateral carotid arteries: Secondary | ICD-10-CM

## 2014-09-03 DIAGNOSIS — N183 Chronic kidney disease, stage 3 unspecified: Secondary | ICD-10-CM

## 2014-09-03 DIAGNOSIS — I10 Essential (primary) hypertension: Secondary | ICD-10-CM

## 2014-09-03 DIAGNOSIS — R55 Syncope and collapse: Secondary | ICD-10-CM

## 2014-09-03 NOTE — Patient Instructions (Signed)
Medication Instructions:  Your physician recommends that you continue on your current medications as directed. Please refer to the Current Medication list given to you today.   Labwork: NONE  Testing/Procedures: Your physician has recommended that you wear an event monitor. Event monitors are medical devices that record the heart's electrical activity. Doctors most often [place] these monitors to diagnose arrhythmias. Arrhythmias are problems with the speed or rhythm of the heartbeat. The monitor is a small, portable device. You can wear one while you do your normal daily activities. This is usually used to diagnose what is causing palpitations/syncope (passing out).    Follow-Up: PT WOULD LIKE TO FOLLOW UP WITH EITHER DR. Rockey Situ OR DR. Fletcher Anon IN THE [place] OFFICE  Any Other Special Instructions Will Be Listed Below (If Applicable).

## 2014-09-06 ENCOUNTER — Encounter (HOSPITAL_COMMUNITY): Payer: Self-pay | Admitting: Emergency Medicine

## 2014-09-06 ENCOUNTER — Emergency Department (HOSPITAL_COMMUNITY)
Admission: EM | Admit: 2014-09-06 | Discharge: 2014-09-06 | Disposition: A | Discharge status: Home / Self Care | Payer: Medicare Other | Attending: Emergency Medicine | Admitting: Emergency Medicine

## 2014-09-06 ENCOUNTER — Emergency Department (HOSPITAL_COMMUNITY): Payer: Medicare Other

## 2014-09-06 DIAGNOSIS — I10 Essential (primary) hypertension: Secondary | ICD-10-CM | POA: Insufficient documentation

## 2014-09-06 DIAGNOSIS — Z8673 Personal history of transient ischemic attack (TIA), and cerebral infarction without residual deficits: Secondary | ICD-10-CM | POA: Diagnosis not present

## 2014-09-06 DIAGNOSIS — I509 Heart failure, unspecified: Secondary | ICD-10-CM | POA: Insufficient documentation

## 2014-09-06 DIAGNOSIS — R079 Chest pain, unspecified: Secondary | ICD-10-CM | POA: Diagnosis not present

## 2014-09-06 DIAGNOSIS — I252 Old myocardial infarction: Secondary | ICD-10-CM | POA: Diagnosis not present

## 2014-09-06 DIAGNOSIS — R42 Dizziness and giddiness: Secondary | ICD-10-CM | POA: Diagnosis not present

## 2014-09-06 DIAGNOSIS — R51 Headache: Secondary | ICD-10-CM | POA: Diagnosis not present

## 2014-09-06 DIAGNOSIS — E119 Type 2 diabetes mellitus without complications: Secondary | ICD-10-CM | POA: Insufficient documentation

## 2014-09-06 DIAGNOSIS — Z87891 Personal history of nicotine dependence: Secondary | ICD-10-CM | POA: Insufficient documentation

## 2014-09-06 DIAGNOSIS — Z79899 Other long term (current) drug therapy: Secondary | ICD-10-CM | POA: Insufficient documentation

## 2014-09-06 DIAGNOSIS — R519 Headache, unspecified: Secondary | ICD-10-CM

## 2014-09-06 DIAGNOSIS — R404 Transient alteration of awareness: Secondary | ICD-10-CM | POA: Diagnosis not present

## 2014-09-06 DIAGNOSIS — Z7982 Long term (current) use of aspirin: Secondary | ICD-10-CM | POA: Diagnosis not present

## 2014-09-06 DIAGNOSIS — F419 Anxiety disorder, unspecified: Secondary | ICD-10-CM

## 2014-09-06 HISTORY — DX: Essential (primary) hypertension: I10

## 2014-09-06 LAB — URINALYSIS, ROUTINE W REFLEX MICROSCOPIC
Bilirubin Urine: NEGATIVE
Glucose, UA: NEGATIVE mg/dL
Hgb urine dipstick: NEGATIVE
Ketones, ur: NEGATIVE mg/dL
LEUKOCYTES UA: NEGATIVE
NITRITE: NEGATIVE
Protein, ur: NEGATIVE mg/dL
Specific Gravity, Urine: 1.005 (ref 1.005–1.030)
UROBILINOGEN UA: 0.2 mg/dL (ref 0.0–1.0)
pH: 6.5 (ref 5.0–8.0)

## 2014-09-06 LAB — BASIC METABOLIC PANEL
ANION GAP: 8 (ref 5–15)
BUN: 13 mg/dL (ref 6–20)
CALCIUM: 10 mg/dL (ref 8.9–10.3)
CHLORIDE: 108 mmol/L (ref 101–111)
CO2: 24 mmol/L (ref 22–32)
Creatinine, Ser: 1.29 mg/dL — ABNORMAL HIGH (ref 0.44–1.00)
GFR calc Af Amer: 49 mL/min — ABNORMAL LOW (ref >60)
GFR calc non Af Amer: 42 mL/min — ABNORMAL LOW (ref >60)
Glucose, Bld: 92 mg/dL (ref 70–99)
Potassium: 4 mmol/L (ref 3.5–5.1)
SODIUM: 140 mmol/L (ref 135–145)

## 2014-09-06 LAB — CBC WITH DIFFERENTIAL/PLATELET
BASOS ABS: 0 10*3/uL (ref 0.0–0.1)
Basophils Relative: 0 % (ref 0–1)
EOS ABS: 0 10*3/uL (ref 0.0–0.7)
EOS PCT: 1 % (ref 0–5)
HCT: 39.7 % (ref 36.0–46.0)
Hemoglobin: 13.4 g/dL (ref 12.0–15.0)
LYMPHS ABS: 1.1 10*3/uL (ref 0.7–4.0)
LYMPHS PCT: 30 % (ref 12–46)
MCH: 32 pg (ref 26.0–34.0)
MCHC: 33.8 g/dL (ref 30.0–36.0)
MCV: 94.7 fL (ref 78.0–100.0)
Monocytes Absolute: 0.2 10*3/uL (ref 0.1–1.0)
Monocytes Relative: 5 % (ref 3–12)
Neutro Abs: 2.3 10*3/uL (ref 1.7–7.7)
Neutrophils Relative %: 64 % (ref 43–77)
PLATELETS: 176 10*3/uL (ref 150–400)
RBC: 4.19 MIL/uL (ref 3.87–5.11)
RDW: 12 % (ref 11.5–15.5)
WBC: 3.5 10*3/uL — AB (ref 4.0–10.5)

## 2014-09-06 LAB — I-STAT TROPONIN, ED
Troponin i, poc: 0 ng/mL (ref 0.00–0.08)
Troponin i, poc: 0 ng/mL (ref 0.00–0.08)

## 2014-09-06 MED ORDER — ACETAMINOPHEN 500 MG PO TABS: 1000.0000 mg | ORAL_TABLET | Freq: Once | ORAL | Status: DC

## 2014-09-06 MED ORDER — METOCLOPRAMIDE HCL 5 MG/ML IJ SOLN
10.0000 mg | Freq: Once | INTRAMUSCULAR | Status: AC
  Administered 2014-09-06: 10 mg via INTRAVENOUS
  Filled 2014-09-06: qty 2

## 2014-09-06 MED ORDER — DIPHENHYDRAMINE HCL 50 MG/ML IJ SOLN
25.0000 mg | Freq: Once | INTRAMUSCULAR | Status: AC
  Administered 2014-09-06: 25 mg via INTRAVENOUS
  Filled 2014-09-06: qty 1

## 2014-09-06 MED ORDER — MECLIZINE HCL 12.5 MG PO TABS: 12.5000 mg | ORAL_TABLET | Freq: Three times a day (TID) | ORAL | Status: DC | PRN

## 2014-09-06 MED ORDER — SODIUM CHLORIDE 0.9 % IV BOLUS (SEPSIS): 1000.0000 mL | Freq: Once | INTRAVENOUS | Status: DC

## 2014-09-06 MED ORDER — MECLIZINE HCL 25 MG PO TABS
25.0000 mg | ORAL_TABLET | Freq: Once | ORAL | Status: AC
  Administered 2014-09-06: 25 mg via ORAL
  Filled 2014-09-06: qty 1

## 2014-09-06 NOTE — ED Notes (Signed)
Pt is in stable condition upon d/c and is escorted by wheelchair from ED by this RN.

## 2014-09-06 NOTE — ED Notes (Signed)
Pt arrived from home by [facility] with c/o dizziness, headache and chest tightness.  Pt chest pain resolved on its own. Pt got up this morning around 0600 feeling fine, then about 0710 pt was walking in house after getting a bath and became dizzy, got a headache and had a tightness feeling on the left side of chest. Currently not dizzy and only complaint is headache that is across the front of her head. Pt stated it felt like her BP was going up when all of the symptoms started. Stroke scale negative for EMS, pt is wearing a cardiac monitor until June and was recently discharged from hospital after having stroke that affected right side 5 months ago. BP-152/75 HR-67 CBG-98. Pt has not had BP medications this morning.

## 2014-09-06 NOTE — ED Notes (Signed)
Pt taken to CT.

## 2014-09-06 NOTE — ED Notes (Signed)
Pt ambulated to restroom with a stand by assist. No deficits noted upon ambulation.

## 2014-09-06 NOTE — ED Notes (Signed)
Melissa, RN at bedside attempting IV start. 1st attempt by NIS, RN unsuccessful

## 2014-09-06 NOTE — ED Provider Notes (Addendum)
CSN: [id number]     Arrival date & time 09/06/14  [id number] History   First MD Initiated Contact with Patient 09/06/14 [phone]     Chief Complaint  Patient presents with  . Dizziness  . Headache  . Chest Pain     (Consider location/radiation/quality/duration/timing/severity/associated sxs/prior Treatment) The history is provided by the patient.  Leslie Houston is a 64 y.o. female hx of stroke with right-sided weakness and right visual field defect, MI, diabetes here presenting with dizziness and lightheadedness. Patient was admitted recently for similar symptoms. She was thought to have symptomatic bradycardia at the time but was taken off beta blockers and heart rate has been stable. She had her possible follow-up 4 days ago with cardiology and she was set up for Holter monitor. She woke up this morning and felt lightheaded and dizzy. She sat up and felt that her blood pressure actually went up but didn't actually pass out. She has some chest pressure today that resolved. Denies any abdominal pain.    Past Medical History  Diagnosis Date  . Stroke   . CHF (congestive heart failure)   . Diabetes mellitus without complication   . MI (myocardial infarction)   . Hypertension    Past Surgical History  Procedure Laterality Date  . Cesarean section     Family History  Problem Relation Age of Onset  . Stroke Mother   . Stroke Father   . Prostate cancer Father   . Diabetes Mellitus II Daughter   . Heart attack Mother    History  Substance Use Topics  . Smoking status: Former Smoker    Quit date: 03/30/2014  . Smokeless tobacco: Not on file  . Alcohol Use: No   OB History    No data available     Review of Systems  Cardiovascular: Positive for chest pain.  Neurological: Positive for dizziness and headaches.  All other systems reviewed and are negative.     Allergies  Review of patient's allergies indicates no known allergies.  Home Medications   Prior to Admission  medications   Medication Sig Start Date End Date Taking? Authorizing Provider  acetaminophen (TYLENOL) 500 MG tablet Take 500 mg by mouth every 6 (six) hours as needed for mild pain.   Yes Historical Provider, MD  amLODipine (NORVASC) 10 MG tablet Take 10 mg by mouth daily.   Yes Historical Provider, MD  aspirin EC 81 MG tablet Take 81 mg by mouth daily.   Yes Historical Provider, MD  fluticasone (FLONASE) 50 MCG/ACT nasal spray Place 2 sprays into both nostrils daily as needed for allergies (allergies).    Yes Historical Provider, MD  hydrALAZINE (APRESOLINE) 10 MG tablet Take 1 tablet (10 mg total) by mouth 3 (three) times daily. 08/22/14  Yes Debbe Odea, MD  isosorbide mononitrate (IMDUR) 30 MG 24 hr tablet Take 30 mg by mouth daily.   Yes Historical Provider, MD  loratadine (CLARITIN) 10 MG tablet Take 10 mg by mouth daily.   Yes Historical Provider, MD  lovastatin (MEVACOR) 40 MG tablet Take 40 mg by mouth at bedtime.   Yes Historical Provider, MD  ondansetron (ZOFRAN) 4 MG tablet Take 4 mg by mouth every 8 (eight) hours as needed for nausea or vomiting (nausea).    Yes Historical Provider, MD  PRESCRIPTION MEDICATION Take 1 tablet by mouth daily. For anxiety   Yes Historical Provider, MD   BP 149/83 mmHg  Pulse 59  Temp(Src) 98.2 F (36.8 C) (Oral)  Resp  23  SpO2 100% Physical Exam  Constitutional: She is oriented to person, place, and time.  Anxious   HENT:  Head: Normocephalic.  Mouth/Throat: Oropharynx is clear and moist.  Eyes: Conjunctivae are normal. Pupils are equal, round, and reactive to light.  Neck: Normal range of motion. Neck supple.  Cardiovascular: Normal rate, regular rhythm and normal heart sounds.   Pulmonary/Chest: Effort normal and breath sounds normal. No respiratory distress. She has no wheezes. She has no rales.  Abdominal: Soft. Bowel sounds are normal. She exhibits no distension. There is no tenderness. There is no rebound and no guarding.   Musculoskeletal: Normal range of motion. She exhibits no edema or tenderness.  Neurological: She is alert and oriented to person, place, and time.  R visual field defect (chronic), strength 4/5 R side (chronic), 5/5 L side.   Skin: Skin is warm and dry.  Psychiatric: She has a normal mood and affect. Her behavior is normal. Judgment and thought content normal.  Nursing note and vitals reviewed.   ED Course  Procedures (including critical care time) Labs Review Labs Reviewed  CBC WITH DIFFERENTIAL/PLATELET - Abnormal; Notable for the following:    WBC 3.5 (*)    All other components within normal limits  BASIC METABOLIC PANEL - Abnormal; Notable for the following:    Creatinine, Ser 1.29 (*)    GFR calc non Af Amer 42 (*)    GFR calc Af Amer 49 (*)    All other components within normal limits  URINALYSIS, ROUTINE W REFLEX MICROSCOPIC  I-STAT TROPOININ, ED  I-STAT TROPOININ, ED    Imaging Review Ct Head Wo Contrast  09/06/2014   CLINICAL DATA:  64 year old diabetic hypertensive female awoke with face fullness and increase blood pressure with headache. Prior stroke. Initial encounter.  EXAM: CT HEAD WITHOUT CONTRAST  TECHNIQUE: Contiguous axial images were obtained from the base of the skull through the vertex without intravenous contrast.  COMPARISON:  08/25/2014.  FINDINGS: No intracranial hemorrhage.  Remote left occipital lobe infarct. No CT evidence of large acute infarct.  No intracranial mass lesion noted on this unenhanced exam.  No hydrocephalus  Mastoid air cells and middle ear cavities as well as this is this is clear. Minimal exophthalmos.  IMPRESSION: Remote left occipital lobe infarct. No CT evidence of large acute infarct.   Electronically Signed   By: Genia Del M.D.   On: 09/06/2014 16:22     EKG Interpretation   Date/Time:  Monday Sep 06 2014 09:47:21 EDT Ventricular Rate:  62 PR Interval:  216 QRS Duration: 87 QT Interval:  429 QTC Calculation: 436 R Axis:    54 Text Interpretation:  Sinus rhythm Borderline prolonged PR interval  Nonspecific T abnormalities, diffuse leads No significant change since  last tracing Confirmed by Dennies Coate  MD, Avarae Zwart ([id number]) on 09/06/2014 10:06:53 AM      MDM   Final diagnoses:  Nonintractable episodic headache, unspecified headache type  Dizziness  Anxiety    Leslie Houston is a 64 y.o. female here with dizziness. Will get orthostatics, repeat labs. No signs of acute stroke. Given chest pressure, will get delta trop. Symptoms likely exacerbated by anxiety.   4:26 PM Delta trop neg. Not orthostatic. Vitals stable for 6 hrs in the ED. CT showed old L occipital infarct. Hasn't tolerated MRI in the past. Nl neuro exam now. Had recent admission with extensive workup. I think likely anxious about her symptoms. Given old occipital infarcts, likely be chronically dizzy. Will give  prn meclizine.     Wandra Arthurs, MD 09/06/14 [id number]  Wandra Arthurs, MD 09/06/14 [phone]

## 2014-09-06 NOTE — ED Notes (Signed)
Phlebotomy called to obtain repeat i-stat troponin on pt.

## 2014-09-06 NOTE — Discharge Instructions (Signed)
Continue your current medications.   Take meclizine as needed for dizziness.   See your doctor for follow up.   You have an event monitor on.   Return to ER if you have worse headaches, weakness, passing out, dizziness.

## 2014-09-06 NOTE — ED Notes (Signed)
Called CT and spoke with Merrilee Seashore on status of pt placement. States there are 2 pt in front.

## 2014-09-07 DIAGNOSIS — N183 Chronic kidney disease, stage 3 (moderate): Secondary | ICD-10-CM | POA: Diagnosis not present

## 2014-09-07 DIAGNOSIS — R809 Proteinuria, unspecified: Secondary | ICD-10-CM | POA: Diagnosis not present

## 2014-09-07 DIAGNOSIS — I5030 Unspecified diastolic (congestive) heart failure: Secondary | ICD-10-CM | POA: Diagnosis not present

## 2014-09-07 DIAGNOSIS — I10 Essential (primary) hypertension: Secondary | ICD-10-CM | POA: Diagnosis not present

## 2014-09-07 DIAGNOSIS — R42 Dizziness and giddiness: Secondary | ICD-10-CM | POA: Diagnosis not present

## 2014-09-07 DIAGNOSIS — R339 Retention of urine, unspecified: Secondary | ICD-10-CM | POA: Diagnosis not present

## 2014-09-07 DIAGNOSIS — N2581 Secondary hyperparathyroidism of renal origin: Secondary | ICD-10-CM | POA: Diagnosis not present

## 2014-09-07 DIAGNOSIS — I69351 Hemiplegia and hemiparesis following cerebral infarction affecting right dominant side: Secondary | ICD-10-CM | POA: Diagnosis not present

## 2014-09-07 DIAGNOSIS — I129 Hypertensive chronic kidney disease with stage 1 through stage 4 chronic kidney disease, or unspecified chronic kidney disease: Secondary | ICD-10-CM | POA: Diagnosis not present

## 2014-09-07 DIAGNOSIS — D631 Anemia in chronic kidney disease: Secondary | ICD-10-CM | POA: Diagnosis not present

## 2014-09-08 ENCOUNTER — Telehealth: Payer: Self-pay | Admitting: Cardiovascular Disease

## 2014-09-08 DIAGNOSIS — R55 Syncope and collapse: Secondary | ICD-10-CM | POA: Diagnosis not present

## 2014-09-08 DIAGNOSIS — R569 Unspecified convulsions: Secondary | ICD-10-CM | POA: Diagnosis not present

## 2014-09-08 DIAGNOSIS — I10 Essential (primary) hypertension: Secondary | ICD-10-CM | POA: Diagnosis not present

## 2014-09-08 DIAGNOSIS — Z8673 Personal history of transient ischemic attack (TIA), and cerebral infarction without residual deficits: Secondary | ICD-10-CM | POA: Diagnosis not present

## 2014-09-08 DIAGNOSIS — F329 Major depressive disorder, single episode, unspecified: Secondary | ICD-10-CM | POA: Diagnosis not present

## 2014-09-08 DIAGNOSIS — F419 Anxiety disorder, unspecified: Secondary | ICD-10-CM | POA: Diagnosis not present

## 2014-09-08 NOTE — Telephone Encounter (Signed)
Leslie Houston is calling for critical EKG.Marland KitchenMarland Kitchen

## 2014-09-08 NOTE — Telephone Encounter (Signed)
[employer] called w/ alert - pt reported seizure. No change to rhythm/monitor activity. Strips read NSR 80-100 BPM. Fax report pending.

## 2014-09-08 NOTE — Telephone Encounter (Signed)
Dr. Debara Pickett ([employer]) signed & reviewed fax transmission. No significant finding visualized on strip report.

## 2014-09-09 ENCOUNTER — Encounter (HOSPITAL_COMMUNITY): Payer: Self-pay | Admitting: Emergency Medicine

## 2014-09-09 ENCOUNTER — Observation Stay (HOSPITAL_COMMUNITY)
Admission: EM | Admit: 2014-09-09 | Discharge: 2014-09-13 | Disposition: A | Discharge status: Home / Self Care | Payer: Medicare Other | Attending: Internal Medicine | Admitting: Internal Medicine

## 2014-09-09 ENCOUNTER — Emergency Department (HOSPITAL_COMMUNITY): Payer: Medicare Other

## 2014-09-09 ENCOUNTER — Observation Stay (HOSPITAL_COMMUNITY): Payer: Medicare Other

## 2014-09-09 ENCOUNTER — Telehealth: Payer: Self-pay | Admitting: Cardiovascular Disease

## 2014-09-09 DIAGNOSIS — Z87891 Personal history of nicotine dependence: Secondary | ICD-10-CM | POA: Diagnosis not present

## 2014-09-09 DIAGNOSIS — I10 Essential (primary) hypertension: Secondary | ICD-10-CM | POA: Diagnosis present

## 2014-09-09 DIAGNOSIS — I69351 Hemiplegia and hemiparesis following cerebral infarction affecting right dominant side: Secondary | ICD-10-CM

## 2014-09-09 DIAGNOSIS — I2510 Atherosclerotic heart disease of native coronary artery without angina pectoris: Secondary | ICD-10-CM | POA: Diagnosis not present

## 2014-09-09 DIAGNOSIS — R079 Chest pain, unspecified: Principal | ICD-10-CM

## 2014-09-09 DIAGNOSIS — I129 Hypertensive chronic kidney disease with stage 1 through stage 4 chronic kidney disease, or unspecified chronic kidney disease: Secondary | ICD-10-CM | POA: Insufficient documentation

## 2014-09-09 DIAGNOSIS — N184 Chronic kidney disease, stage 4 (severe): Secondary | ICD-10-CM | POA: Diagnosis present

## 2014-09-09 DIAGNOSIS — Z8673 Personal history of transient ischemic attack (TIA), and cerebral infarction without residual deficits: Secondary | ICD-10-CM | POA: Insufficient documentation

## 2014-09-09 DIAGNOSIS — N183 Chronic kidney disease, stage 3 unspecified: Secondary | ICD-10-CM | POA: Diagnosis present

## 2014-09-09 DIAGNOSIS — E119 Type 2 diabetes mellitus without complications: Secondary | ICD-10-CM | POA: Diagnosis not present

## 2014-09-09 DIAGNOSIS — Z7982 Long term (current) use of aspirin: Secondary | ICD-10-CM | POA: Insufficient documentation

## 2014-09-09 DIAGNOSIS — I6523 Occlusion and stenosis of bilateral carotid arteries: Secondary | ICD-10-CM

## 2014-09-09 DIAGNOSIS — Z79899 Other long term (current) drug therapy: Secondary | ICD-10-CM | POA: Diagnosis not present

## 2014-09-09 DIAGNOSIS — R55 Syncope and collapse: Secondary | ICD-10-CM | POA: Diagnosis not present

## 2014-09-09 DIAGNOSIS — G40909 Epilepsy, unspecified, not intractable, without status epilepticus: Secondary | ICD-10-CM | POA: Diagnosis not present

## 2014-09-09 DIAGNOSIS — I6529 Occlusion and stenosis of unspecified carotid artery: Secondary | ICD-10-CM | POA: Diagnosis present

## 2014-09-09 DIAGNOSIS — I509 Heart failure, unspecified: Secondary | ICD-10-CM | POA: Insufficient documentation

## 2014-09-09 DIAGNOSIS — I493 Ventricular premature depolarization: Secondary | ICD-10-CM | POA: Insufficient documentation

## 2014-09-09 DIAGNOSIS — K279 Peptic ulcer, site unspecified, unspecified as acute or chronic, without hemorrhage or perforation: Secondary | ICD-10-CM | POA: Diagnosis not present

## 2014-09-09 DIAGNOSIS — G9389 Other specified disorders of brain: Secondary | ICD-10-CM | POA: Diagnosis not present

## 2014-09-09 DIAGNOSIS — R569 Unspecified convulsions: Secondary | ICD-10-CM | POA: Diagnosis not present

## 2014-09-09 DIAGNOSIS — R001 Bradycardia, unspecified: Secondary | ICD-10-CM | POA: Insufficient documentation

## 2014-09-09 DIAGNOSIS — I951 Orthostatic hypotension: Secondary | ICD-10-CM | POA: Insufficient documentation

## 2014-09-09 HISTORY — DX: Ventricular premature depolarization: I49.3

## 2014-09-09 HISTORY — DX: Unspecified convulsions: R56.9

## 2014-09-09 HISTORY — DX: Bradycardia, unspecified: R00.1

## 2014-09-09 HISTORY — DX: Atherosclerotic heart disease of native coronary artery without angina pectoris: I25.10

## 2014-09-09 HISTORY — DX: Chronic kidney disease, stage 3 unspecified: N18.30

## 2014-09-09 HISTORY — DX: Orthostatic hypotension: I95.1

## 2014-09-09 HISTORY — DX: Syncope and collapse: R55

## 2014-09-09 HISTORY — DX: Occlusion and stenosis of unspecified carotid artery: I65.29

## 2014-09-09 HISTORY — DX: Type 2 diabetes mellitus without complications: E11.9

## 2014-09-09 HISTORY — DX: Peptic ulcer, site unspecified, unspecified as acute or chronic, without hemorrhage or perforation: K27.9

## 2014-09-09 HISTORY — DX: Chronic kidney disease, stage 3 (moderate): N18.3

## 2014-09-09 LAB — GLUCOSE, CAPILLARY: GLUCOSE-CAPILLARY: 87 mg/dL (ref 65–99)

## 2014-09-09 LAB — BASIC METABOLIC PANEL
Anion gap: 9 (ref 5–15)
BUN: 17 mg/dL (ref 6–20)
CO2: 23 mmol/L (ref 22–32)
Calcium: 9.7 mg/dL (ref 8.9–10.3)
Chloride: 109 mmol/L (ref 101–111)
Creatinine, Ser: 1.33 mg/dL — ABNORMAL HIGH (ref 0.44–1.00)
GFR calc Af Amer: 47 mL/min — ABNORMAL LOW (ref >60)
GFR calc non Af Amer: 41 mL/min — ABNORMAL LOW (ref >60)
Glucose, Bld: 100 mg/dL — ABNORMAL HIGH (ref 65–99)
Potassium: 3.8 mmol/L (ref 3.5–5.1)
Sodium: 141 mmol/L (ref 135–145)

## 2014-09-09 LAB — URINALYSIS, ROUTINE W REFLEX MICROSCOPIC
Bilirubin Urine: NEGATIVE
Glucose, UA: NEGATIVE mg/dL
Hgb urine dipstick: NEGATIVE
Ketones, ur: NEGATIVE mg/dL
Leukocytes, UA: NEGATIVE
Nitrite: NEGATIVE
Protein, ur: NEGATIVE mg/dL
Specific Gravity, Urine: 1.012 (ref 1.005–1.030)
Urobilinogen, UA: 0.2 mg/dL (ref 0.0–1.0)
pH: 5.5 (ref 5.0–8.0)

## 2014-09-09 LAB — CK: Total CK: 35 U/L — ABNORMAL LOW (ref 38–234)

## 2014-09-09 LAB — CBC
HCT: 37.8 % (ref 36.0–46.0)
Hemoglobin: 12.7 g/dL (ref 12.0–15.0)
MCH: 31.4 pg (ref 26.0–34.0)
MCHC: 33.6 g/dL (ref 30.0–36.0)
MCV: 93.3 fL (ref 78.0–100.0)
Platelets: 177 10*3/uL (ref 150–400)
RBC: 4.05 MIL/uL (ref 3.87–5.11)
RDW: 12 % (ref 11.5–15.5)
WBC: 3.8 10*3/uL — ABNORMAL LOW (ref 4.0–10.5)

## 2014-09-09 LAB — TROPONIN I

## 2014-09-09 LAB — I-STAT TROPONIN, ED: Troponin i, poc: 0.01 ng/mL (ref 0.00–0.08)

## 2014-09-09 MED ORDER — SODIUM CHLORIDE 0.9 % IJ SOLN
3.0000 mL | Freq: Two times a day (BID) | INTRAMUSCULAR | Status: DC
  Administered 2014-09-09 – 2014-09-13 (×7): 3 mL via INTRAVENOUS

## 2014-09-09 MED ORDER — ACETAMINOPHEN 325 MG PO TABS
650.0000 mg | ORAL_TABLET | Freq: Four times a day (QID) | ORAL | Status: DC | PRN
  Administered 2014-09-10 – 2014-09-12 (×3): 650 mg via ORAL
  Filled 2014-09-09 (×3): qty 2

## 2014-09-09 MED ORDER — ENOXAPARIN SODIUM 40 MG/0.4ML ~~LOC~~ SOLN
40.0000 mg | SUBCUTANEOUS | Status: DC
  Administered 2014-09-09 – 2014-09-12 (×4): 40 mg via SUBCUTANEOUS
  Filled 2014-09-09 (×4): qty 0.4

## 2014-09-09 MED ORDER — LORAZEPAM 2 MG/ML IJ SOLN
2.0000 mg | Freq: Once | INTRAMUSCULAR | Status: AC
  Administered 2014-09-09: 2 mg via INTRAVENOUS
  Filled 2014-09-09: qty 1

## 2014-09-09 MED ORDER — ASPIRIN EC 81 MG PO TBEC
81.0000 mg | DELAYED_RELEASE_TABLET | Freq: Every day | ORAL | Status: DC
  Administered 2014-09-10 – 2014-09-13 (×4): 81 mg via ORAL
  Filled 2014-09-09 (×8): qty 1

## 2014-09-09 MED ORDER — MECLIZINE HCL 25 MG PO TABS: 12.5000 mg | ORAL_TABLET | Freq: Three times a day (TID) | ORAL | Status: DC | PRN

## 2014-09-09 MED ORDER — AMLODIPINE BESYLATE 10 MG PO TABS
10.0000 mg | ORAL_TABLET | Freq: Every day | ORAL | Status: DC
  Administered 2014-09-10 – 2014-09-13 (×4): 10 mg via ORAL
  Filled 2014-09-09 (×4): qty 1

## 2014-09-09 MED ORDER — LORAZEPAM 2 MG/ML IJ SOLN
1.0000 mg | Freq: Once | INTRAMUSCULAR | Status: AC
  Administered 2014-09-09: 1 mg via INTRAVENOUS

## 2014-09-09 MED ORDER — CITALOPRAM HYDROBROMIDE 10 MG PO TABS
10.0000 mg | ORAL_TABLET | Freq: Every day | ORAL | Status: DC
  Administered 2014-09-10 – 2014-09-13 (×4): 10 mg via ORAL
  Filled 2014-09-09 (×4): qty 1

## 2014-09-09 MED ORDER — ACETAMINOPHEN 650 MG RE SUPP: 650.0000 mg | Freq: Four times a day (QID) | RECTAL | Status: DC | PRN

## 2014-09-09 MED ORDER — LORAZEPAM 2 MG/ML IJ SOLN
INTRAMUSCULAR | Status: AC
  Filled 2014-09-09: qty 1

## 2014-09-09 NOTE — ED Provider Notes (Signed)
Patient presented to the ER with seizures and chest pain. Patient has recently started having focal seizures. She is in the process of having this worked up. She has had 4 or 5 of them today and has been complaining of chest pain associated with seizures. Patient has staring episodes and unresponsive periods followed by sharp pains in the center of her chest.  Face to face Exam: HEENT - PERRLA Lungs - CTAB Heart - RRR, no M/R/G Abd - S/NT/ND Neuro - alert, oriented x3  Plan: Patient has had at least 1 seizure here in the ER. Initial cardiac evaluation unremarkable. Will consult hospitalist for admission for further workup of new onset and worsening seizures.  Orpah Greek, MD 09/09/14 [phone]

## 2014-09-09 NOTE — ED Notes (Addendum)
Per EMS Patient family states at 1010 patient "sterried off and arms started shaking family states lasted about a min. Patient states after the event her chest began to feel tight. Patient was given 324 ASA and 2 nitro with EMS patient has no pain on arrival. Alert & O x4. Patient has a HX of Stroke and has some Left-sided weakness.

## 2014-09-09 NOTE — Telephone Encounter (Signed)
Preventive staff called with an update on this pt having a passing out episode, and stated the pt. Was heading to the hospital, nothing was recorded on her monitor to reflect why the pt. Had this syncopal episode

## 2014-09-09 NOTE — H&P (Signed)
History and Physical  Leslie Houston [id number] DOB: 10-Jan-1951 DOA: 09/09/2014   PCP: Bobetta Lime, MD  Referring Physician: ED/ Dr. Betsey Holiday  Chief Complaint: near syncope  HPI:  64 year old female with a history of grade 2 diastolic dysfunction, CVA,  HTN, CAD presenting with syncope. Patient noted to have been admitted March 21 of March 24 for syncope. Workup included 2-D echocardiogram which showed grade 2 diastolic dysfunction. CT angiogram of the head and neck negative for any acute infarct though with 50-60% stenosis of the internal carotid arteries bilaterally. Case discussed w/ vascular surgery per report-not felt to be candidate for intervention. Please see discharge summary for full details. The patient was noted to be orthostatic at that time and her blood pressure medicines were held. The patient has had 2 separate admissions from 08/19/2014 through 08/22/2014 and from 08/23/2014 through 08/26/2014 for near syncope. Repeat orthostatics were negative. Because of the patient's hypertension, the patient was started back on her amlodipine, but her metoprolol and hydralazine were held. The patient was seen by cardiology, and she was set up with a Holter monitor after her discharge on 08/23/2014. Reports from the Holter monitor did not show any significant dysrhythmias. The patient had an EEG which was negative. She was seen by neurology and offered an MRI of the brain, but the patient refused. As result, the patient was discharged in stable condition. She represented to the emergency department with one-day history of seizure-like activity and staring spells which last 30 seconds. Interestingly, during her spells, the patient is able to respond to her environment. There is no loss of consciousness or bowel or bladder incontinence or tongue biting. She denies any fevers, chills, nausea, vomiting, diarrhea, abdominal pain, dysuria, hematuria. She stated that she has had some chest  discomfort that started on the morning of admission. She states that this occurs at rest without any exacerbating or alleviating factors. She denies any coughing or hemoptysis. The patient was seen by her primary care provider on the day prior to admission who recommended admission, but the patient refused until today. The patient states that a sister with chest discomfort she has episodes of feeling drunk and her eyes getting crossed. In the emergency department, chest x-ray were negative. The patient was afebrile and hemodynamically stable. BMP and CBC were unremarkable. Urinalysis did not show any pyuria.Point of care troponin was negative. EKG shows sinus rhythm with T-wave inversion in V4-V6 and 2, 3, aVF, but this was unchanged from previous EKGs. Assessment/Plan: Near syncope/syncope -Recheck orthostatics -Orthostatic vital signs on her last admission were negative -Patient had extensive workup from cardiology and neurology -Holter monitor did not reveal significant dysrhythmia -I have reconsulted cardiology  to see the patient -Urine drug screen -Urinalysis Seizure-like activity -Repeat EEG -Emergency department had consulted neurology to see the patient -I question whether the patient may be having PNES (pseudoseizure) -The patient is agreeable to having MRI of the brain if she is given Ativan--I have ordered MRI of the brain -Check CPK and prolactin Atypical chest pain -EKG unchanged -Cycle troponins -08/22/2014 echocardiogram showed EF 55-60 percent, grade 2 diastolic dysfunction CKD stage III -Baseline creatinine [id number] Chronic diastolic dysfunction -Euvolemic presently Essential hypertension -The patient will need to tolerate a higher systemic blood pressure given her near syncopal episodes -Hold metoprolol and hydralazine as discussed -Continue amlodipine History of stroke -Continue aspirin and statin Anxiety -Continue Celexa       Past Medical History    Diagnosis Date  .  Stroke     a. L PCA CVA in 05/2014.  . Diabetes mellitus   . Hypertension   . CKD (chronic kidney disease), stage III   . Sinus bradycardia   . PVC's (premature ventricular contractions)   . Orthostatic hypotension   . Carotid stenosis     a. CT angio head 07/2014 - 50-60% stenoses of prox ICA bilaterally.  . PUD (peptic ulcer disease)   . Syncope   . Seizures   . CAD (coronary artery disease)     a. Pt reports in 2000 she had a stent to a coronary artery, details unclear, outside hospital.  . CHF (congestive heart failure)     a. Pt reports in 2000 she had CHF, details unclear, outside hospital.   Past Surgical History  Procedure Laterality Date  . Cesarean section     Social History:  reports that she quit smoking about 5 months ago. She does not have any smokeless tobacco history on file. She reports that she does not drink alcohol or use illicit drugs.   Family History  Problem Relation Age of Onset  . Stroke Mother   . Stroke Father   . Prostate cancer Father   . Diabetes Mellitus II Daughter   . Heart attack Mother      No Known Allergies    Prior to Admission medications   Medication Sig Start Date End Date Taking? Authorizing Provider  acetaminophen (TYLENOL) 325 MG tablet Take 650 mg by mouth every 6 (six) hours as needed.   Yes Historical Provider, MD  amLODipine (NORVASC) 10 MG tablet Take 10 mg by mouth daily.   Yes Historical Provider, MD  aspirin 81 MG tablet Take 81 mg by mouth daily.   Yes Historical Provider, MD  citalopram (CELEXA) 10 MG tablet Take 10 mg by mouth daily.   Yes Historical Provider, MD  fluticasone (FLONASE) 50 MCG/ACT nasal spray Place 2 sprays into both nostrils daily as needed for allergies (allergies).    Yes Historical Provider, MD  isosorbide mononitrate (IMDUR) 30 MG 24 hr tablet Take 30 mg by mouth daily.   Yes Historical Provider, MD  loratadine (CLARITIN) 10 MG tablet Take 10 mg by mouth daily.   Yes  Historical Provider, MD  lovastatin (MEVACOR) 40 MG tablet Take 40 mg by mouth at bedtime.   Yes Historical Provider, MD  meclizine (ANTIVERT) 12.5 MG tablet Take 1 tablet (12.5 mg total) by mouth 3 (three) times daily as needed for dizziness. 09/06/14  Yes Wandra Arthurs, MD  metoprolol succinate (TOPROL-XL) 25 MG 24 hr tablet Take 25 mg by mouth daily.   Yes Historical Provider, MD  ondansetron (ZOFRAN) 4 MG tablet Take 4 mg by mouth every 8 (eight) hours as needed for nausea or vomiting (nausea).    Yes Historical Provider, MD  hydrALAZINE (APRESOLINE) 10 MG tablet Take 1 tablet (10 mg total) by mouth 3 (three) times daily. 08/22/14   Debbe Odea, MD    Review of Systems:  Constitutional:  No weight loss, night sweats, Head&Eyes: No headache.  No vision loss.  No eye pain or scotoma ENT:  No Difficulty swallowing,Tooth/dental problems,Sore throat,  No ear ache, post nasal drip,  Cardio-vascular:  No  Orthopnea, PND, swelling in lower extremities,  dizziness, palpitations  GI:  No  abdominal pain, nausea, vomiting, diarrhea, loss of appetite, hematochezia, melena, heartburn, indigestion, Resp:  No shortness of breath with exertion or at rest. No cough. No coughing up of blood .No  wheezing.No chest wall deformity  Skin:  no rash or lesions.  GU:  no dysuria, change in color of urine, no urgency or frequency. No flank pain.  Musculoskeletal:  No joint pain or swelling. No decreased range of motion. No back pain.  Psych:  No change in mood or affect. No depression or anxiety. Neurologic: No headache, no dysesthesia, no focal weakness, no vision loss  Physical Exam: Filed Vitals:   09/09/14 1600 09/09/14 1615 09/09/14 1630 09/09/14 1743  BP: 115/70 126/75 144/69 119/63  Pulse: 69 74 72 70  Temp:    98 F (36.7 C)  TempSrc:    Oral  Resp: 18 19 17 18   SpO2: 95% 96% 96%    General:  A&O x 3, NAD, nontoxic, pleasant/cooperative Head/Eye: No conjunctival hemorrhage, no icterus,  /AT, No nystagmus ENT:  No icterus,  No thrush, good dentition, no pharyngeal exudate Neck:  No masses, no lymphadenpathy, no bruits CV:  RRR, no rub, no gallop, no S3 Lung:  CTAB, good air movement, no wheeze, no rhonchi Abdomen: soft/NT, +BS, nondistended, no peritoneal signs Ext: No cyanosis, No rashes, No petechiae, No lymphangitis, No edema   Labs on Admission:  Basic Metabolic Panel:  Recent Labs Lab 09/06/14 1026 09/09/14 1248  NA 140 141  K 4.0 3.8  CL 108 109  CO2 24 23  GLUCOSE 92 100*  BUN 13 17  CREATININE 1.29* 1.33*  CALCIUM 10.0 9.7   Liver Function Tests: No results for input(s): AST, ALT, ALKPHOS, BILITOT, PROT, ALBUMIN in the last 168 hours. No results for input(s): LIPASE, AMYLASE in the last 168 hours. No results for input(s): AMMONIA in the last 168 hours. CBC:  Recent Labs Lab 09/06/14 1026 09/09/14 1248  WBC 3.5* 3.8*  NEUTROABS 2.3  --   HGB 13.4 12.7  HCT 39.7 37.8  MCV 94.7 93.3  PLT 176 177   Cardiac Enzymes: No results for input(s): CKTOTAL, CKMB, CKMBINDEX, TROPONINI in the last 168 hours. BNP: Invalid input(s): POCBNP CBG:  Recent Labs Lab 09/09/14 1741  GLUCAP 87    Radiological Exams on Admission: Dg Chest 2 View  09/09/2014   CLINICAL DATA:  Chronic progressive left sided chest pain.  EXAM: CHEST  2 VIEW  COMPARISON:  08/24/2014  FINDINGS: Heart size and pulmonary vascularity are normal and the lungs are clear. No effusions. No significant osseous abnormality.  IMPRESSION: No active cardiopulmonary disease.   Electronically Signed   By: Lorriane Shire M.D.   On: 09/09/2014 12:59    EKG: Independently reviewed. Sinus with TWI II, III, aVF and v4-v6    Time spent:60 minutes Code Status:   FULL Family Communication:   Family at bedside   Fitzhugh Vizcarrondo, DO  [facility] Pager [phone]  If 7PM-7AM, please contact night-coverage [email or web address] Password [facility] 09/09/2014, 7:02 PM

## 2014-09-09 NOTE — Consult Note (Signed)
Cardiology Consultation Note  Patient ID: Leslie Houston, MRN: [id number], DOB/AGE: October 08, 1950 64 y.o. Admit date: 09/09/2014   Date of Consult: 09/09/2014 Primary Physician: Bobetta Lime, MD Primary Cardiologist: Dr. Quay Burow >> will FU in [place] office (lives in [place])   Chief Complaint: went unresponsive Reason for Consultation: chest pain, LOC -> witnessed seizure in ED  HPI: Leslie Houston is a 64 y/o F with history of ?CAD (patient reports h/o stenting in 2000 and CHF - outside hospital), L PCA CVA in 05/2014, HTN, CKD stage III, sinus bradycardia, PVCs, orthostatic hypotension, carotid stenosis (50-60% BICA by CT head 07/2014), DM2, PUD, and recent recurrent syncope who presented to [facility] with an episode of altered consciousness. She has been admitted several times recently with recurrent loss of consciousness. She was admitted 4/21-4/24 with near syncope thought to be related to orthostatic BP drop.2D Echo 08/22/14 showed EF 55-60%, no RWMA, grade 2 DD, mild-mod dilated LA. She was readmitted 4/25-4/28 with syncope. Orthostatics were normal. She was seen by neuro due to question of seizure like activity at the time of her syncope.EEG was negative. She was noted to have sinus bradycardia with HR 40s at times, mostly 50s-60s. Beta blocker was stopped. No clear cardiac cause was noted.   She was seen in the ER again 5/9 for dizziness, lightheadedness, and chest discomfort. She was not orthostatic. Troponins were negative. EDP felt given old occipital infarct on CT she was likely to be chronically dizzy. She has continued to have episodes of LOC several times a week. On 5/11 our office was notified by [employer] Event Monitoring as the patient had reported a seizure-type episode. Event monitoring showed NSR 80-100BPM. The same thing happened twice at home today. She describes these as occurring either when she is about to go stand up, or when she is in the process of  moving from one place to another. Today she was still sitting/lying down with all of her episodes though. She suddenly will feel nauseated with upper chest tightness/throat area, then feels her eyes crossed, then stares off into space with mild twitching in her extremities. She describes it as feeling like she is aware of her surroundings but is paralyzed. Chest pain is no longer present when the episode resolves. She denies any exertional chest pain or dyspnea with the physical therapy she has been doing at home. She reports her BP lately tends to go up when she stands up (A999333 systolic on one home reading) rather than fall. She denies any palpitations, LEE, fever, chills, focal weakness. She has persistent right visual field loss ever since her stroke in 05/2014. The seizures are new ever since the stroke. In the ER, she had 2 episodes of seizures in the ER. She received IV ativan. Telemetry NSR. Event monitor confirms no cardiac events/arrhythmias today. BMET unremarkable. CXR: no active CP disease.  Past Medical History  Diagnosis Date  . Stroke     a. L PCA CVA in 05/2014.  . Diabetes mellitus   . Hypertension   . CKD (chronic kidney disease), stage III   . Sinus bradycardia   . PVC's (premature ventricular contractions)   . Orthostatic hypotension   . Carotid stenosis     a. CT angio head 07/2014 - 50-60% stenoses of prox ICA bilaterally.  . PUD (peptic ulcer disease)   . Syncope   . Seizures   . CAD (coronary artery disease)     a. Pt reports in 2000 she had a  stent to a coronary artery, details unclear, outside hospital.  . CHF (congestive heart failure)     a. Pt reports in 2000 she had CHF, details unclear, outside hospital.      Most Recent Cardiac Studies: 2D echo 07/2014 - Left ventricle: The cavity size was normal. Wall thickness was normal. Systolic function was normal. The estimated ejection fraction was in the range of 55% to 60%. Wall motion was normal; there were no  regional wall motion abnormalities. Features are consistent with a pseudonormal left ventricular filling pattern, with concomitant abnormal relaxation and increased filling pressure (grade 2 diastolic dysfunction). - Left atrium: The atrium was mildly to moderately dilated.   Surgical History:  Past Surgical History  Procedure Laterality Date  . Cesarean section       Home Meds: Prior to Admission medications   Medication Sig Start Date End Date Taking? Authorizing Provider  acetaminophen (TYLENOL) 325 MG tablet Take 650 mg by mouth every 6 (six) hours as needed.   Yes Historical Provider, MD  amLODipine (NORVASC) 10 MG tablet Take 10 mg by mouth daily.   Yes Historical Provider, MD  aspirin 81 MG tablet Take 81 mg by mouth daily.   Yes Historical Provider, MD  citalopram (CELEXA) 10 MG tablet Take 10 mg by mouth daily.   Yes Historical Provider, MD  fluticasone (FLONASE) 50 MCG/ACT nasal spray Place 2 sprays into both nostrils daily as needed for allergies (allergies).    Yes Historical Provider, MD  isosorbide mononitrate (IMDUR) 30 MG 24 hr tablet Take 30 mg by mouth daily.   Yes Historical Provider, MD  loratadine (CLARITIN) 10 MG tablet Take 10 mg by mouth daily.   Yes Historical Provider, MD  lovastatin (MEVACOR) 40 MG tablet Take 40 mg by mouth at bedtime.   Yes Historical Provider, MD  meclizine (ANTIVERT) 12.5 MG tablet Take 1 tablet (12.5 mg total) by mouth 3 (three) times daily as needed for dizziness. 09/06/14  Yes Wandra Arthurs, MD  metoprolol succinate (TOPROL-XL) 25 MG 24 hr tablet Take 25 mg by mouth daily.   Yes Historical Provider, MD  ondansetron (ZOFRAN) 4 MG tablet Take 4 mg by mouth every 8 (eight) hours as needed for nausea or vomiting (nausea).    Yes Historical Provider, MD  hydrALAZINE (APRESOLINE) 10 MG tablet Take 1 tablet (10 mg total) by mouth 3 (three) times daily. 08/22/14   Debbe Odea, MD    Inpatient Medications:     Allergies: No Known  Allergies  History   Social History  . Marital Status: Single    Spouse Name: N/A  . Number of Children: N/A  . Years of Education: N/A   Occupational History  . Not on file.   Social History Main Topics  . Smoking status: Former Smoker    Quit date: 03/30/2014  . Smokeless tobacco: Not on file  . Alcohol Use: No  . Drug Use: No  . Sexual Activity: Not on file   Other Topics Concern  . Not on file   Social History Narrative     Family History  Problem Relation Age of Onset  . Stroke Mother   . Stroke Father   . Prostate cancer Father   . Diabetes Mellitus II Daughter   . Heart attack Mother      Review of Systems: Gets occasional hot flashes. No vomiting. No palpitations. No LEE. All other systems reviewed and are otherwise negative except as noted above.  Labs: No results  for input(s): CKTOTAL, CKMB, TROPONINI in the last 72 hours. Lab Results  Component Value Date   WBC 3.8* 09/09/2014   HGB 12.7 09/09/2014   HCT 37.8 09/09/2014   MCV 93.3 09/09/2014   PLT 177 09/09/2014     Recent Labs Lab 09/09/14 1248  NA 141  K 3.8  CL 109  CO2 23  BUN 17  CREATININE 1.33*  CALCIUM 9.7  GLUCOSE 100*    Radiology/Studies:  Dg Chest 2 View  09/09/2014   CLINICAL DATA:  Chronic progressive left sided chest pain.  EXAM: CHEST  2 VIEW  COMPARISON:  08/24/2014  FINDINGS: Heart size and pulmonary vascularity are normal and the lungs are clear. No effusions. No significant osseous abnormality.  IMPRESSION: No active cardiopulmonary disease.   Electronically Signed   By: Lorriane Shire M.D.   On: 09/09/2014 12:59   Ct Head Wo Contrast  09/06/2014   CLINICAL DATA:  64 year old diabetic hypertensive female awoke with face fullness and increase blood pressure with headache. Prior stroke. Initial encounter.  EXAM: CT HEAD WITHOUT CONTRAST  TECHNIQUE: Contiguous axial images were obtained from the base of the skull through the vertex without intravenous contrast.   COMPARISON:  08/25/2014.  FINDINGS: No intracranial hemorrhage.  Remote left occipital lobe infarct. No CT evidence of large acute infarct.  No intracranial mass lesion noted on this unenhanced exam.  No hydrocephalus  Mastoid air cells and middle ear cavities as well as this is this is clear. Minimal exophthalmos.  IMPRESSION: Remote left occipital lobe infarct. No CT evidence of large acute infarct.   Electronically Signed   By: Genia Del M.D.   On: 09/06/2014 16:22    Wt Readings from Last 3 Encounters:  09/03/14 170 lb (77.111 kg)  08/26/14 167 lb 8.8 oz (76 kg)  08/19/14 174 lb (78.926 kg)    EKG: NSR 68bpm, 1st degree AVB, nonspecific T wave changes - LVH with repol abnormality, similar to prior EKG  Physical Exam: Blood pressure 144/69, pulse 72, temperature 97.6 F (36.4 C), temperature source Oral, resp. rate 17, SpO2 96 %. General: Well developed, well nourished AAF, in no acute distress. Laying 20 degrees in bed Head: Normocephalic, atraumatic, sclera non-icteric, no xanthomas, nares are without discharge.  Neck: JVD not elevated. Lungs: Clear bilaterally to auscultation without wheezes, rales, or rhonchi. Breathing is unlabored. Heart: RRR with S1 S2. No murmurs, rubs, or gallops appreciated. Abdomen: Soft, non-tender, non-distended with normoactive bowel sounds. No hepatomegaly. No rebound/guarding. No obvious abdominal masses. Msk:  Strength and tone appear normal for age. Extremities: No clubbing or cyanosis. No edema.  Distal pedal pulses are 2+ and equal bilaterally. Neuro: Alert and oriented X 3. No facial asymmetry. Moves all extremities spontaneously. Psych:  Responds to questions appropriately with a normal affect.    Assessment and Plan:  1. Recurrent seizures with antecedent chest pain 2. History of orthostatic hypotension and essential hypertension 3. CAD s/p stenting and CHF per patient report in 2000 4. Recent stroke 05/2014 5. Sinus bradycardia 6. CKD  stage III  Thus far, her recurrent episodes of loss of consciousness are non-arrhythmogenic in nature. She had a witnessed seizure in the ER. Recommend further workup of seizure disorder per internal medicine. Question related to recent stroke. Likely needs neurology input this admission. Chest pain is atypical and only temporally related to the event. She has no other anginal symptoms outside of these episodes. Given history of CAD, agree with cycling cardiac enzymes. Recent 2D echo  showed normal LV function and normal wall motion, so will discuss further work-up with Dr. Percival Spanish. Continue aspirin, amlodipine, statin and Imdur when OK with primary team. Avoid BB with history of sinus bradycardia - not sure why this is back on her med list as it was discontinued by cardiology last month. Fortunately her monitoring has not shown any bradycardia though. See below for additional thoughts.  SignedMelina Copa PA-C 09/09/2014, 4:49 PM Pager: [phone]  History and all data above reviewed.  Patient examined.  I agree with the findings as above.  We are called to see the patient.    We did retrieve strips and she has had NSR.  She had one of her episodes today while in the ED.  There were no arrhythmias.  These are described above.  She does get chest discomfort before these occur but she does not get this discomfort at other times.  She has a limited functional status secondary to a previous stroke.  However, she does walk with a walker.  The patient exam reveals COR:RRR  ,  Lungs: Clear  ,  Abd: Positive bowel sounds, no rebound no guarding, Ext No edema  .  All available labs, radiology testing, previous records reviewed. Agree with documented assessment and plan.   Chest pain:  There is an apparent history of CAD but we don't have these data.  However, current symptoms are very atypical.  Cycle enzymes.  I doubt that further cardiovascular testing is indicated.   We have reviewed the telemetry in the ED and  the event monitor.  There is no cardiac etiology to her events. Please call with further questions.   Jeneen Rinks Chastelyn Athens  5:26 PM  09/09/2014

## 2014-09-09 NOTE — Progress Notes (Signed)
Pt. Experienced focal like seizure activity while standing up attempting to take orthostatic vitals. Pt put back in the bed. Pt. Was alert and oriented and asymptomatic about 30 seconds after episode .Cardiology and attending MD notified. Wardell Honour, RN

## 2014-09-10 ENCOUNTER — Observation Stay (HOSPITAL_COMMUNITY): Payer: Medicare Other

## 2014-09-10 DIAGNOSIS — N183 Chronic kidney disease, stage 3 (moderate): Secondary | ICD-10-CM

## 2014-09-10 DIAGNOSIS — Z8673 Personal history of transient ischemic attack (TIA), and cerebral infarction without residual deficits: Secondary | ICD-10-CM | POA: Diagnosis not present

## 2014-09-10 DIAGNOSIS — R55 Syncope and collapse: Secondary | ICD-10-CM | POA: Diagnosis not present

## 2014-09-10 DIAGNOSIS — R569 Unspecified convulsions: Secondary | ICD-10-CM

## 2014-09-10 DIAGNOSIS — R079 Chest pain, unspecified: Secondary | ICD-10-CM | POA: Diagnosis not present

## 2014-09-10 LAB — CBC
HCT: 39.6 % (ref 36.0–46.0)
Hemoglobin: 13.2 g/dL (ref 12.0–15.0)
MCH: 31.3 pg (ref 26.0–34.0)
MCHC: 33.3 g/dL (ref 30.0–36.0)
MCV: 93.8 fL (ref 78.0–100.0)
Platelets: 176 10*3/uL (ref 150–400)
RBC: 4.22 MIL/uL (ref 3.87–5.11)
RDW: 12.2 % (ref 11.5–15.5)
WBC: 3.5 10*3/uL — ABNORMAL LOW (ref 4.0–10.5)

## 2014-09-10 LAB — RAPID URINE DRUG SCREEN, HOSP PERFORMED
Amphetamines: NOT DETECTED
Barbiturates: NOT DETECTED
Benzodiazepines: NOT DETECTED
Cocaine: NOT DETECTED
Opiates: NOT DETECTED
Tetrahydrocannabinol: NOT DETECTED

## 2014-09-10 LAB — BASIC METABOLIC PANEL
Anion gap: 10 (ref 5–15)
BUN: 14 mg/dL (ref 6–20)
CALCIUM: 9.6 mg/dL (ref 8.9–10.3)
CO2: 25 mmol/L (ref 22–32)
Chloride: 106 mmol/L (ref 101–111)
Creatinine, Ser: 1.32 mg/dL — ABNORMAL HIGH (ref 0.44–1.00)
GFR calc Af Amer: 48 mL/min — ABNORMAL LOW (ref >60)
GFR, EST NON AFRICAN AMERICAN: 41 mL/min — AB (ref >60)
GLUCOSE: 87 mg/dL (ref 65–99)
POTASSIUM: 4.2 mmol/L (ref 3.5–5.1)
Sodium: 141 mmol/L (ref 135–145)

## 2014-09-10 LAB — GLUCOSE, CAPILLARY
Glucose-Capillary: 84 mg/dL (ref 65–99)
Glucose-Capillary: 104 mg/dL — ABNORMAL HIGH (ref 65–99)
Glucose-Capillary: 81 mg/dL (ref 65–99)

## 2014-09-10 LAB — TROPONIN I

## 2014-09-10 LAB — PROLACTIN: Prolactin: 8.9 ng/mL (ref 4.8–23.3)

## 2014-09-10 NOTE — Progress Notes (Signed)
Pt had near syncopal episode while sitting on BSC. Family at bedside and immediately called myself in there where I noted pt on bedside commode, alert and responsive but was weak and not holding herself up very well. Pt did not fall off of the BSC. Primary MD and Neurologist on floor and immediately came in the assess episode. Pt safely put back to bed. Will closely montior

## 2014-09-10 NOTE — Progress Notes (Signed)
[facility] PROGRESS NOTE  Leslie Houston [id number] DOB: November 18, 1950 DOA: 09/09/2014 PCP: Bobetta Lime, MD  Assessment/Plan: 1. Recurrent syncope. -Patient undergoing extensive workup for recurrent syncope that has included transthoracic echocardiogram, CTA of head and neck, CT scan of brain without contrast, MRI of brain, EEG which has all been unrevealing. Orthostatics have been negative. Cardiology feeling this is not likely to be cardiac in origin and recommended a neurology consultation. -Ambulated patient today with walker, became weak upon leaving room and required assistance with standing and was sat down on chair. She did not lose consciousness. Blood pressures were checked on sitting with systolic blood pressures in the 140s. -I spoke with neurology for official consultation.  2.  Essential hypertension. -As mentioned above, patient has not had orthostatic hypotension. Systolic blood pressures fluctuating between 116 and 140. -Continue Norvasc 10 mg by mouth daily  3.  History of CVA. -Repeat MRI of brain not revealing acute intracranial abnormality. No findings on brain imaging to account for present symptoms. -Continue antiplatelet therapy with aspirin 81 mg by mouth daily.  4.  Stage III chronic kidney disease. -Creatinine stable at 1.3.  Code Status: Full code Family Communication: Family not present Disposition Plan: Neurology consultation placed today.   Consultants:  Cardiology  Neurology   HPI/Subjective: Patient is a 64 year old female with a past medical history of chronic diastolic congestive heart failure, history of CVA, hypertension, coronary artery disease, having a history recurrent syncope with previous hospitalizations undergoing syncope workup. Workup has included transthoracic echocardiogram performed on 08/22/2014 that showed grade 2 diastolic dysfunction with EF of 55-60%, CTA of head and neck performed on 08/20/2014 that showed  stable appearance of 80 month old left PCA territory infarct, 50-60% stenosis of the proximal internal carotid arteries bilaterally, without significant posterior circulation stenosis identified. During this hospitalization she had an MRI of brain performed on 09/09/2014 that did not reveal acute intracranial process. She was evaluated by cardiology, tele strops reviewed. Cardiology did not recommend further cardiac workup.   Objective: Filed Vitals:   09/10/14 0834  BP:   Pulse:   Temp: 98.2 F (36.8 C)  Resp:     Intake/Output Summary (Last 24 hours) at 09/10/14 1526 Last data filed at 09/10/14 1118  Gross per 24 hour  Intake    360 ml  Output      0 ml  Net    360 ml   Filed Weights   09/10/14 0516  Weight: 77.384 kg (170 lb 9.6 oz)    Exam:   General:  Patient is in no acute distress, however upon ambulation rhythm, she became faint, did not lose consciousness.  Cardiovascular: Regular rate and rhythm normal S1-S2 no murmurs rubs or gallops  Respiratory: Normal respiratory effort, lungs are clear to auscultation bilaterally  Abdomen: Soft nontender nondistended  Musculoskeletal: No edema   Data Reviewed: Basic Metabolic Panel:  Recent Labs Lab 09/06/14 1026 09/09/14 1248 09/10/14 0455  NA 140 141 141  K 4.0 3.8 4.2  CL 108 109 106  CO2 24 23 25   GLUCOSE 92 100* 87  BUN 13 17 14   CREATININE 1.29* 1.33* 1.32*  CALCIUM 10.0 9.7 9.6   Liver Function Tests: No results for input(s): AST, ALT, ALKPHOS, BILITOT, PROT, ALBUMIN in the last 168 hours. No results for input(s): LIPASE, AMYLASE in the last 168 hours. No results for input(s): AMMONIA in the last 168 hours. CBC:  Recent Labs Lab 09/06/14 1026 09/09/14 1248 09/10/14 0455  WBC 3.5* 3.8* 3.5*  NEUTROABS 2.3  --   --   HGB 13.4 12.7 13.2  HCT 39.7 37.8 39.6  MCV 94.7 93.3 93.8  PLT 176 177 176   Cardiac Enzymes:  Recent Labs Lab 09/09/14 1900 09/10/14 0455  CKTOTAL 35*  --   TROPONINI  <0.03 <0.03   BNP (last 3 results) No results for input(s): BNP in the last 8760 hours.  ProBNP (last 3 results) No results for input(s): PROBNP in the last 8760 hours.  CBG:  Recent Labs Lab 09/09/14 1741 09/10/14 0740 09/10/14 1125  GLUCAP 87 84 104*    No results found for this or any previous visit (from the past 240 hour(s)).   Studies: Dg Chest 2 View  09/09/2014   CLINICAL DATA:  Chronic progressive left sided chest pain.  EXAM: CHEST  2 VIEW  COMPARISON:  08/24/2014  FINDINGS: Heart size and pulmonary vascularity are normal and the lungs are clear. No effusions. No significant osseous abnormality.  IMPRESSION: No active cardiopulmonary disease.   Electronically Signed   By: Lorriane Shire M.D.   On: 09/09/2014 12:59   Mr Brain Wo Contrast  09/10/2014   EXAM: MRI HEAD WITHOUT CONTRAST  TECHNIQUE: Multiplanar, multiecho pulse sequences of the brain and surrounding structures were obtained without intravenous contrast.  COMPARISON:  Prior CT from 09/06/2014.  FINDINGS: Mild diffuse cerebral atrophy is present. Scattered patchy and confluent T2/FLAIR hyperintensity within the periventricular and deep white matter is most consistent with chronic small vessel ischemic disease, fairly mild for patient age. Encephalomalacia with gliosis within the left occipital lobe extending anteriorly towards the mesial left temporal lobe is compatible with remote left PCA territory infarct.  Hippocampi are symmetric in appearance with normal size and morphology normal signal intensity.  No abnormal foci of restricted diffusion to suggest acute intracranial infarct identified. Gray-white matter differentiation otherwise maintained. Normal intravascular flow voids preserved. No acute intracranial hemorrhage.  No mass lesion or midline shift. No hydrocephalus. No extra-axial fluid collection.  Craniocervical junction within normal limits. Mild degenerative changes present within the visualized upper  cervical spine. Pituitary gland normal. No acute abnormality about the orbits. Paranasal sinuses are clear. No mastoid effusion. Inner ear structures normal.  Bone marrow signal intensity normal. Scalp soft tissues unremarkable.  IMPRESSION: 1. No acute intracranial process identified. 2. Remote left PCA territory infarct. 3. Mild chronic small vessel ischemic disease involving the supratentorial white matter.   Electronically Signed   By: Jeannine Boga M.D.   On: 09/10/2014 00:18    Scheduled Meds: . amLODipine  10 mg Oral Daily  . aspirin EC  81 mg Oral Daily  . citalopram  10 mg Oral Daily  . enoxaparin (LOVENOX) injection  40 mg Subcutaneous Q24H  . sodium chloride  3 mL Intravenous Q12H   Continuous Infusions:   Active Problems:   History of stroke   CKD (chronic kidney disease) stage 3, GFR 30-59 ml/min   Near syncope   Carotid stenosis   CKD (chronic kidney disease), stage III   Hypertension   Diabetes mellitus   Pain in the chest   Seizure    Time spent: 35 min    Kelvin Cellar  [facility] Pager [phone]. If 7PM-7AM, please contact night-coverage at [email or web address], password [email or web address] 09/10/2014, 3:26 PM

## 2014-09-10 NOTE — Progress Notes (Signed)
Pt had orders to perform orthostatic vitals standing at 1, 3 ,5 and 10 minutes if she was able to. Pt BP at 1 min was 128/82 with a pulse of 87. At 3 minutes her BP was 158/102 with a pulse of 92. At the 3 minute mark had a near syncopal episode and was eased to the side of the bed. At this point, I did not feel comfortable continuing because the patient reported some lightheadedness and weakness. Will continue to monitor  Shanon Rosser

## 2014-09-10 NOTE — Progress Notes (Signed)
[facility]  Patient Status: Active (receiving services up to time of hospitalization)  [facility] is providing the following services: RN and PT  If patient discharges after hours, please call [phone].   Leslie Houston 09/10/2014, 10:10 AM

## 2014-09-10 NOTE — Procedures (Signed)
ELECTROENCEPHALOGRAM REPORT  Date of Study: 09/10/2014  Patient's Name: Leslie Houston MRN: [id number] Date of Birth: 07/05/1950  Referring Provider: Dr. Shanon Brow Tat  Clinical History: This is a 64 year old woman with syncope. She presented with a one-day history of seizure-like activity and staring spells which last 30 seconds.The patient is able to respond to her environment.  Medications: acetaminophen (TYLENOL) tablet 650 mg amLODipine (NORVASC) tablet 10 mg aspirin EC tablet 81 mg citalopram (CELEXA) tablet 10 mg enoxaparin (LOVENOX) injection 40 mg meclizine (ANTIVERT) tablet 12.5 mg  Technical Summary: A multichannel digital EEG recording measured by the international 10-20 system with electrodes applied with paste and impedances below 5000 ohms performed in our laboratory with EKG monitoring in an awake and asleep patient.  Hyperventilation and photic stimulation were not performed.  The digital EEG was referentially recorded, reformatted, and digitally filtered in a variety of bipolar and referential montages for optimal display.    Description: The patient is awake and asleep during the recording.  During maximal wakefulness, there is a symmetric, medium voltage 11 Hz posterior dominant rhythm that attenuates with eye opening.  The record is symmetric.  There is an excess amount of diffuse low voltage sharply contoured beta activity seen throughout the recording. During drowsiness and sleep, there is an increase in theta slowing of the background, with shifting asymmetry over the bilateral temporal regions. Vertex waves and symmetric sleep spindles were seen.  Hyperventilation and photic stimulation were not performed.  There were no epileptiform discharges or electrographic seizures seen.    EKG lead was unremarkable.  Impression: This awake and asleep EEG is within normal limits for age except for excess amount of diffuse low voltage beta activity.  Clinical  Correlation: Diffuse low voltage beta activity is commonly seen with sedating medications such as benzodiazepines.  In the absence of sedating medications, anxiety and hyperthyroidism may produce generalized beta activity.  The absence of epileptiform discharges does not exclude a clinical diagnosis of epilepsy.  Clinical correlation is advised.   Ellouise Newer, M.D.

## 2014-09-10 NOTE — Progress Notes (Signed)
EEG Completed; Results Pending  

## 2014-09-10 NOTE — Consult Note (Signed)
NEURO HOSPITALIST CONSULT NOTE    Reason for Consult: Syncope  HPI:                                                                                                                                          Leslie Houston is an 64 y.o. female with a history of grade 2 diastolic dysfunction, CVA, HTN, CAD presenting with syncope. Patient noted to have been admitted March 21 of March 24 for syncope. Workup included 2-D echocardiogram which showed grade 2 diastolic dysfunction. CT angiogram of the head and neck negative for any acute infarct though with 50-60% stenosis of the internal carotid arteries bilaterally. Case discussed w/ vascular surgery per report-not felt to be candidate for intervention.  The patient has had 2 separate admissions from 08/19/2014 through 08/22/2014 and from 08/23/2014 through 08/26/2014 for near syncope. Repeat orthostatics were negative. Because of the patient's hypertension, the patient was started back on her amlodipine, but her metoprolol and hydralazine were held. The patient was seen by cardiology, and she was set up with a Holter monitor after her discharge on 08/23/2014. Reports from the Holter monitor did not show any significant dysrhythmias. The patient had an EEG which was negative. She was seen by neurology and offered an MRI of the brain, but the patient refused. patient was discharged in stable condition. She againpresented to the emergency department on this admission with one-day history of seizure-like activity and staring spells which last 30 seconds. During her spells, the patient is able to respond to her environment. There is no loss of consciousness or bowel or bladder incontinence or tongue biting. On admission this hospitalization she has had negative orthostatics times two. Repeat EEG again was negative. MRI showed no acute findings.  Per staff she had another episode where she was walked in the hall and she suddenly said her whole  body felt heavy. She was sat back in chair and slowly resolved.  There was no LOC.    Past Medical History  Diagnosis Date  . Stroke     a. L PCA CVA in 05/2014.  . Diabetes mellitus   . Hypertension   . CKD (chronic kidney disease), stage III   . Sinus bradycardia   . PVC's (premature ventricular contractions)   . Orthostatic hypotension   . Carotid stenosis     a. CT angio head 07/2014 - 50-60% stenoses of prox ICA bilaterally.  . PUD (peptic ulcer disease)   . Syncope   . Seizures   . CAD (coronary artery disease)     a. Pt reports in 2000 she had a stent to a coronary artery, details unclear, outside hospital.  . CHF (congestive heart failure)     a. Pt reports in 2000 she had CHF, details unclear,  outside hospital.    Past Surgical History  Procedure Laterality Date  . Cesarean section      Family History  Problem Relation Age of Onset  . Stroke Mother   . Stroke Father   . Prostate cancer Father   . Diabetes Mellitus II Daughter   . Heart attack Mother     Social History:  reports that she quit smoking about 5 months ago. She does not have any smokeless tobacco history on file. She reports that she does not drink alcohol or use illicit drugs.  No Known Allergies  MEDICATIONS:                                                                                                                     Scheduled: . amLODipine  10 mg Oral Daily  . aspirin EC  81 mg Oral Daily  . citalopram  10 mg Oral Daily  . enoxaparin (LOVENOX) injection  40 mg Subcutaneous Q24H  . sodium chloride  3 mL Intravenous Q12H     ROS:                                                                                                                                       History obtained from the patient  General ROS: negative for - chills, fatigue, fever, night sweats, weight gain or weight loss Psychological ROS: negative for - behavioral disorder, hallucinations, memory difficulties, mood  swings or suicidal ideation Ophthalmic ROS: negative for - blurry vision, double vision, eye pain or loss of vision ENT ROS: negative for - epistaxis, nasal discharge, oral lesions, sore throat, tinnitus or vertigo Allergy and Immunology ROS: negative for - hives or itchy/watery eyes Hematological and Lymphatic ROS: negative for - bleeding problems, bruising or swollen lymph nodes Endocrine ROS: negative for - galactorrhea, hair pattern changes, polydipsia/polyuria or temperature intolerance Respiratory ROS: negative for - cough, hemoptysis, shortness of breath or wheezing Cardiovascular ROS: negative for - chest pain, dyspnea on exertion, edema or irregular heartbeat Gastrointestinal ROS: negative for - abdominal pain, diarrhea, hematemesis, nausea/vomiting or stool incontinence Genito-Urinary ROS: negative for - dysuria, hematuria, incontinence or urinary frequency/urgency Musculoskeletal ROS: negative for - joint swelling or muscular weakness Neurological ROS: as noted in HPI Dermatological ROS: negative for rash and skin lesion changes   Blood pressure 116/70, pulse 65, temperature 98.2 F (36.8 C), temperature source Oral, resp. rate 18, weight 77.384  kg (170 lb 9.6 oz), SpO2 100 %.   Neurologic Examination:                                                                                                      HEENT-  Normocephalic, no lesions, without obvious abnormality.  Normal external eye and conjunctiva.  Normal TM's bilaterally.  Normal auditory canals and external ears. Normal external nose, mucus membranes and septum.  Normal pharynx. Cardiovascular- S1, S2 normal, pulses palpable throughout   Lungs- chest clear, no wheezing, rales, normal symmetric air entry Abdomen- normal findings: bowel sounds normal Extremities- no edema Lymph-no adenopathy palpable Musculoskeletal-no joint tenderness, deformity or swelling Skin-warm and dry, no hyperpigmentation, vitiligo, or suspicious  lesions  Neurological Examination Mental Status: Alert, oriented, thought content appropriate.  Speech fluent without evidence of aphasia.  Able to follow 3 step commands without difficulty. Cranial Nerves: II: Discs flat bilaterally; Visual fields shows a right hemianopsia (old), pupils equal, round, reactive to light and accommodation III,IV, VI: ptosis not present, extra-ocular motions intact bilaterally V,VII: smile symmetric, facial light touch sensation normal bilaterally VIII: hearing normal bilaterally IX,X: uvula rises symmetrically XI: bilateral shoulder shrug XII: midline tongue extension Motor: Right : Upper extremity   5/5    Left:     Upper extremity   5/5  Lower extremity   5/5     Lower extremity   5/5 Tone and bulk:normal tone throughout; no atrophy noted Sensory: Pinprick and light touch intact throughout, bilaterally Deep Tendon Reflexes: 2+ and symmetric throughout Plantars: Mute toes Cerebellar: normal finger-to-nose and normal heel-to-shin test Gait: Walks with a walker with short shuffle gait.       Lab Results: Basic Metabolic Panel:  Recent Labs Lab 09/06/14 1026 09/09/14 1248 09/10/14 0455  NA 140 141 141  K 4.0 3.8 4.2  CL 108 109 106  CO2 24 23 25   GLUCOSE 92 100* 87  BUN 13 17 14   CREATININE 1.29* 1.33* 1.32*  CALCIUM 10.0 9.7 9.6    Liver Function Tests: No results for input(s): AST, ALT, ALKPHOS, BILITOT, PROT, ALBUMIN in the last 168 hours. No results for input(s): LIPASE, AMYLASE in the last 168 hours. No results for input(s): AMMONIA in the last 168 hours.  CBC:  Recent Labs Lab 09/06/14 1026 09/09/14 1248 09/10/14 0455  WBC 3.5* 3.8* 3.5*  NEUTROABS 2.3  --   --   HGB 13.4 12.7 13.2  HCT 39.7 37.8 39.6  MCV 94.7 93.3 93.8  PLT 176 177 176    Cardiac Enzymes:  Recent Labs Lab 09/09/14 1900 09/10/14 0455  CKTOTAL 35*  --   TROPONINI <0.03 <0.03    Lipid Panel: No results for input(s): CHOL, TRIG, HDL,  CHOLHDL, VLDL, LDLCALC in the last 168 hours.  CBG:  Recent Labs Lab 09/09/14 1741 09/10/14 0740 09/10/14 1125  GLUCAP 87 49 104*    Microbiology: Results for orders placed or performed during the hospital encounter of 08/23/14  Urine culture     Status: None   Collection Time: 08/24/14  2:20 AM  Result Value  Ref Range Status   Specimen Description URINE, RANDOM  Final   Special Requests NONE  Final   Colony Count   Final    >=100,000 COLONIES/ML Performed at [facility]    Culture   Final    KLEBSIELLA PNEUMONIAE Performed at [facility]    Report Status 08/26/2014 FINAL  Final   Organism ID, Bacteria KLEBSIELLA PNEUMONIAE  Final      Susceptibility   Klebsiella pneumoniae - MIC*    AMPICILLIN >=32 RESISTANT Resistant     CEFAZOLIN <=4 SENSITIVE Sensitive     CEFTRIAXONE <=1 SENSITIVE Sensitive     CIPROFLOXACIN <=0.25 SENSITIVE Sensitive     GENTAMICIN <=1 SENSITIVE Sensitive     LEVOFLOXACIN <=0.12 SENSITIVE Sensitive     NITROFURANTOIN 64 INTERMEDIATE Intermediate     TOBRAMYCIN <=1 SENSITIVE Sensitive     TRIMETH/SULFA <=20 SENSITIVE Sensitive     PIP/TAZO <=4 SENSITIVE Sensitive     * KLEBSIELLA PNEUMONIAE    Coagulation Studies: No results for input(s): LABPROT, INR in the last 72 hours.  Imaging: Dg Chest 2 View  09/09/2014   CLINICAL DATA:  Chronic progressive left sided chest pain.  EXAM: CHEST  2 VIEW  COMPARISON:  08/24/2014  FINDINGS: Heart size and pulmonary vascularity are normal and the lungs are clear. No effusions. No significant osseous abnormality.  IMPRESSION: No active cardiopulmonary disease.   Electronically Signed   By: Lorriane Shire M.D.   On: 09/09/2014 12:59   Mr Brain Wo Contrast  09/10/2014   EXAM: MRI HEAD WITHOUT CONTRAST  TECHNIQUE: Multiplanar, multiecho pulse sequences of the brain and surrounding structures were obtained without intravenous contrast.  COMPARISON:  Prior CT from 09/06/2014.  FINDINGS: Mild  diffuse cerebral atrophy is present. Scattered patchy and confluent T2/FLAIR hyperintensity within the periventricular and deep white matter is most consistent with chronic small vessel ischemic disease, fairly mild for patient age. Encephalomalacia with gliosis within the left occipital lobe extending anteriorly towards the mesial left temporal lobe is compatible with remote left PCA territory infarct.  Hippocampi are symmetric in appearance with normal size and morphology normal signal intensity.  No abnormal foci of restricted diffusion to suggest acute intracranial infarct identified. Gray-white matter differentiation otherwise maintained. Normal intravascular flow voids preserved. No acute intracranial hemorrhage.  No mass lesion or midline shift. No hydrocephalus. No extra-axial fluid collection.  Craniocervical junction within normal limits. Mild degenerative changes present within the visualized upper cervical spine. Pituitary gland normal. No acute abnormality about the orbits. Paranasal sinuses are clear. No mastoid effusion. Inner ear structures normal.  Bone marrow signal intensity normal. Scalp soft tissues unremarkable.  IMPRESSION: 1. No acute intracranial process identified. 2. Remote left PCA territory infarct. 3. Mild chronic small vessel ischemic disease involving the supratentorial white matter.   Electronically Signed   By: Jeannine Boga M.D.   On: 09/10/2014 00:18       Assessment and plan per attending neurologist  Etta Quill PA-C [facility] [phone]  09/10/2014, 3:24 PM   Assessment/Plan: 64 yo F with recurrent events. Her description of the events sounds like hypoperfusion, though no change in BP has been documented on orthostatics. Especially the fact that it only really happens now when standing would support this. I am concerned for a delayed orthostatic VS change and would recommend checking orthostatics with longer delay. I do not think that the  episodes are consistent with seizure. If no change on her VS, then I am not sure  if I have any further work-up to offer.   1) Orthostatic VS to look for delayed orthostasis.  2) If no change then no further workup and treatment would be supportive(e.g. PT, taking time between position changes, etc)  Roland Rack, MD [facility] [phone]  If 7pm- 7am, please page neurology on call as listed in [place].

## 2014-09-11 DIAGNOSIS — I10 Essential (primary) hypertension: Secondary | ICD-10-CM

## 2014-09-11 DIAGNOSIS — R55 Syncope and collapse: Secondary | ICD-10-CM | POA: Diagnosis not present

## 2014-09-11 DIAGNOSIS — R079 Chest pain, unspecified: Secondary | ICD-10-CM | POA: Diagnosis not present

## 2014-09-11 DIAGNOSIS — E119 Type 2 diabetes mellitus without complications: Secondary | ICD-10-CM

## 2014-09-11 DIAGNOSIS — N183 Chronic kidney disease, stage 3 (moderate): Secondary | ICD-10-CM | POA: Diagnosis not present

## 2014-09-11 DIAGNOSIS — I129 Hypertensive chronic kidney disease with stage 1 through stage 4 chronic kidney disease, or unspecified chronic kidney disease: Secondary | ICD-10-CM | POA: Diagnosis not present

## 2014-09-11 LAB — GLUCOSE, CAPILLARY
GLUCOSE-CAPILLARY: 101 mg/dL — AB (ref 65–99)
GLUCOSE-CAPILLARY: 213 mg/dL — AB (ref 65–99)
Glucose-Capillary: 118 mg/dL — ABNORMAL HIGH (ref 65–99)
Glucose-Capillary: 95 mg/dL (ref 65–99)
Glucose-Capillary: 130 mg/dL — ABNORMAL HIGH (ref 65–99)

## 2014-09-11 MED ORDER — ONDANSETRON HCL 4 MG/2ML IJ SOLN
4.0000 mg | Freq: Four times a day (QID) | INTRAMUSCULAR | Status: DC | PRN
  Administered 2014-09-11 – 2014-09-13 (×2): 4 mg via INTRAVENOUS
  Filled 2014-09-11 (×2): qty 2

## 2014-09-11 NOTE — Progress Notes (Signed)
Attempted to repeat orthostatic order this am. Pt BP lying down was 148/77 HR 76. Pt sat on the side of the bed BP was 170/78 HR 76. Pt stood on side of the bed for approximately 50 seconds before she had a syncopal episode and was eased to the side of the bed. Did not continue to get orthostatic vitals. Will continue to monitor.

## 2014-09-11 NOTE — Progress Notes (Signed)
Called to pt room by family members. Patient family members stated "pt was having an episode." I called pt name and asked her where she was and she answered questions appropriately. Pt BP was 185/74 HR 72. Pt alert and oriented x4. Will continue to monitor.

## 2014-09-11 NOTE — Progress Notes (Signed)
[facility] PROGRESS NOTE  Leslie Houston [id number] DOB: [birth year] DOA: 09/09/2014 PCP: Bobetta Lime, MD  Assessment/Plan: 1. Recurrent syncope. -Patient undergoing extensive workup for recurrent syncope that has included transthoracic echocardiogram, CTA of head and neck, CT scan of brain without contrast, MRI of brain, EEG which has all been unrevealing. Orthostatics have been negative. Cardiology feeling this is not likely to be cardiac in origin and recommended a neurology consultation. -Ambulated patient today with walker, became weak upon leaving room and required assistance with standing and was sat down on chair. She did not lose consciousness. Blood pressures were checked on sitting with systolic blood pressures in the 140s. -Case was discussed with neurology. It is unlikely that these episodes are present seizure disorder. She has undergone extensive cardiac and neurologic workups that have been unremarkable. Dr Leonel Ramsay did not recommend further neurological work up.   2.  Essential hypertension. -She had not been noted to have episodes of hypotension that would explain current symptoms.  -Continue Norvasc 10 mg by mouth daily  3.  History of CVA. -Repeat MRI of brain not revealing acute intracranial abnormality. No findings on brain imaging to account for present symptoms. -Continue antiplatelet therapy with aspirin 81 mg by mouth daily.  4.  Stage III chronic kidney disease. -Creatinine stable at 1.3.  5. Deconditioning  -Will consult PT  Code Status: Full code Family Communication: Family not present Disposition Plan: Neurology consultation placed today.   Consultants:  Cardiology  Neurology   HPI/Subjective: Patient is a 64 year old female with a past medical history of chronic diastolic congestive heart failure, history of CVA, hypertension, coronary artery disease, having a history recurrent syncope with previous hospitalizations undergoing  syncope workup. Workup has included transthoracic echocardiogram performed on 08/22/2014 that showed grade 2 diastolic dysfunction with EF of 55-60%, CTA of head and neck performed on 08/20/2014 that showed stable appearance of 109 month old left PCA territory infarct, 50-60% stenosis of the proximal internal carotid arteries bilaterally, without significant posterior circulation stenosis identified. During this hospitalization she had an MRI of brain performed on 09/09/2014 that did not reveal acute intracranial process. She was evaluated by cardiology, tele strops reviewed. Cardiology did not recommend further cardiac workup.   Objective: Filed Vitals:   09/11/14 1256  BP:   Pulse:   Temp: 97.6 F (36.4 C)  Resp:    No intake or output data in the 24 hours ending 09/11/14 1432 Filed Weights   09/10/14 0516 09/11/14 0631  Weight: 77.384 kg (170 lb 9.6 oz) 77.384 kg (170 lb 9.6 oz)    Exam:   General:  Patient is in no acute distress  Cardiovascular: Regular rate and rhythm normal S1-S2 no murmurs rubs or gallops  Respiratory: Normal respiratory effort, lungs are clear to auscultation bilaterally  Abdomen: Soft nontender nondistended  Musculoskeletal: No edema   Data Reviewed: Basic Metabolic Panel:  Recent Labs Lab 09/06/14 1026 09/09/14 1248 09/10/14 0455  NA 140 141 141  K 4.0 3.8 4.2  CL 108 109 106  CO2 24 23 25   GLUCOSE 92 100* 87  BUN 13 17 14   CREATININE 1.29* 1.33* 1.32*  CALCIUM 10.0 9.7 9.6   Liver Function Tests: No results for input(s): AST, ALT, ALKPHOS, BILITOT, PROT, ALBUMIN in the last 168 hours. No results for input(s): LIPASE, AMYLASE in the last 168 hours. No results for input(s): AMMONIA in the last 168 hours. CBC:  Recent Labs Lab 09/06/14 1026 09/09/14 1248 09/10/14 0455  WBC 3.5* 3.8* 3.5*  NEUTROABS 2.3  --   --   HGB 13.4 12.7 13.2  HCT 39.7 37.8 39.6  MCV 94.7 93.3 93.8  PLT 176 177 176   Cardiac Enzymes:  Recent Labs Lab  09/09/14 1900 09/10/14 0455  CKTOTAL 35*  --   TROPONINI <0.03 <0.03   BNP (last 3 results) No results for input(s): BNP in the last 8760 hours.  ProBNP (last 3 results) No results for input(s): PROBNP in the last 8760 hours.  CBG:  Recent Labs Lab 09/10/14 1648 09/10/14 2038 09/11/14 0811 09/11/14 0812 09/11/14 1143  GLUCAP 81 101* 213* 118* 95    No results found for this or any previous visit (from the past 240 hour(s)).   Studies: Mr Brain Wo Contrast  09/10/2014   EXAM: MRI HEAD WITHOUT CONTRAST  TECHNIQUE: Multiplanar, multiecho pulse sequences of the brain and surrounding structures were obtained without intravenous contrast.  COMPARISON:  Prior CT from 09/06/2014.  FINDINGS: Mild diffuse cerebral atrophy is present. Scattered patchy and confluent T2/FLAIR hyperintensity within the periventricular and deep white matter is most consistent with chronic small vessel ischemic disease, fairly mild for patient age. Encephalomalacia with gliosis within the left occipital lobe extending anteriorly towards the mesial left temporal lobe is compatible with remote left PCA territory infarct.  Hippocampi are symmetric in appearance with normal size and morphology normal signal intensity.  No abnormal foci of restricted diffusion to suggest acute intracranial infarct identified. Gray-white matter differentiation otherwise maintained. Normal intravascular flow voids preserved. No acute intracranial hemorrhage.  No mass lesion or midline shift. No hydrocephalus. No extra-axial fluid collection.  Craniocervical junction within normal limits. Mild degenerative changes present within the visualized upper cervical spine. Pituitary gland normal. No acute abnormality about the orbits. Paranasal sinuses are clear. No mastoid effusion. Inner ear structures normal.  Bone marrow signal intensity normal. Scalp soft tissues unremarkable.  IMPRESSION: 1. No acute intracranial process identified. 2. Remote left  PCA territory infarct. 3. Mild chronic small vessel ischemic disease involving the supratentorial white matter.   Electronically Signed   By: Jeannine Boga M.D.   On: 09/10/2014 00:18    Scheduled Meds: . amLODipine  10 mg Oral Daily  . aspirin EC  81 mg Oral Daily  . citalopram  10 mg Oral Daily  . enoxaparin (LOVENOX) injection  40 mg Subcutaneous Q24H  . sodium chloride  3 mL Intravenous Q12H   Continuous Infusions:   Active Problems:   History of stroke   CKD (chronic kidney disease) stage 3, GFR 30-59 ml/min   Near syncope   Carotid stenosis   CKD (chronic kidney disease), stage III   Hypertension   Diabetes mellitus   Pain in the chest   Seizure    Time spent: 35 min    Kelvin Cellar  [facility] Pager [phone]. If 7PM-7AM, please contact night-coverage at [email or web address], password [facility] 09/11/2014, 2:32 PM

## 2014-09-12 DIAGNOSIS — I10 Essential (primary) hypertension: Secondary | ICD-10-CM | POA: Diagnosis not present

## 2014-09-12 DIAGNOSIS — R079 Chest pain, unspecified: Secondary | ICD-10-CM | POA: Diagnosis not present

## 2014-09-12 DIAGNOSIS — Z8673 Personal history of transient ischemic attack (TIA), and cerebral infarction without residual deficits: Secondary | ICD-10-CM | POA: Diagnosis not present

## 2014-09-12 DIAGNOSIS — N183 Chronic kidney disease, stage 3 (moderate): Secondary | ICD-10-CM | POA: Diagnosis not present

## 2014-09-12 NOTE — Evaluation (Signed)
Physical Therapy Evaluation Patient Details Name: Remmie Bembenek MRN: [id number] DOB: 1951/02/27 Today's Date: 09/12/2014   History of Present Illness    64 year old female with a past medical history of chronic diastolic congestive heart failure, history of CVA, hypertension, coronary artery disease, having a history recurrent syncope with previous hospitalizations undergoing syncope workup. Currently, neurology and cardiology unrevealing.  Clinical Impression   Pt presents with significant mobility limitations due to abnormal cardiovascular response to positional changes. While pt is functionally physically independent with most mobility activities, she is unable to tolerate positional changes and is symptomatic with increased challenge. See orthostatic results below.   Pt presents with s/s consistent with orthostatic hypertension, wondering if baroreflex dysfunction/sympathetic system dysfunction could explain.    Pt would not benefit from SNF at d/c as mobility limitations appear to by strictly medical (vs. Physical) in source.  Recommend medical intervention and d/c home with HHPT, 24 hour supervision once medical status resolved.  Nothing further to add from PT, thank you for referral.   Orthostatic BPs  POSITION BP HR COMMENTS  Supine (HOB 30) 153/65 69 HA 5/10  Sitting 170/71 86 Lightheaded, HA same  Sitting after 3 minutes 180/90 90 same  Sitting after 6 min 168/78 91 same  Standing 151/96 110 Feels episode coming, unable to remain standing >30 sec  Supine (HOB 0) 166/72 74 HA 7/10  Supine after 3 minutes 156/65 66 Feels better      Follow Up Recommendations Home health PT;Supervision for mobility/OOB;Supervision/Assistance - 24 hour    Equipment Recommendations  None recommended by PT    Recommendations for Other Services       Precautions / Restrictions Precautions Precautions: Fall Precaution Comments: Syncopal episode with standing during PT  eval Restrictions Weight Bearing Restrictions: No      Mobility  Bed Mobility Overal bed mobility: Modified Independent Bed Mobility: Supine to Sit;Sit to Supine     Supine to sit: Modified independent (Device/Increase time) Sit to supine: Modified independent (Device/Increase time)   General bed mobility comments: limited by symptomic orthostatic responses, but able to perform without physical assist  Transfers Overall transfer level: Needs assistance Equipment used: Rolling walker (2 wheeled) Transfers: Sit to/from Stand Sit to Stand: Supervision         General transfer comment: close supervision due to syncopal events, pt lacks confidence and prefers to hold on to RW, unable to tolerate standing >30 seconds due to symptomatic onset of orthstasis  Ambulation/Gait             General Gait Details: unable due to symptomatic orthostasis  Stairs            Wheelchair Mobility    Modified Rankin (Stroke Patients Only)       Balance     Sitting balance-Leahy Scale: Good       Standing balance-Leahy Scale: Fair Standing balance comment: short duration, static,                              Pertinent Vitals/Pain Pain Assessment: 0-10 Pain Score: 5  Pain Location: headache right side, increases with upright/standing  Pain Intervention(s): Limited activity within patient's tolerance;Monitored during session;Repositioned    Home Living Family/patient expects to be discharged to:: Private residence Living Arrangements: Children;Parent Available Help at Discharge: Family;Available 24 hours/day Type of Home: House Home Access: Stairs to enter Entrance Stairs-Rails: None Entrance Stairs-Number of Steps: 1 Home Layout: One level Home Equipment: Environmental consultant -  2 wheels;Cane - single point Additional Comments: has 24 hour supervision, uses cane primarily with occasional use of walker    Prior Function Level of Independence: Independent with  assistive device(s)   Gait / Transfers Assistance Needed: requires assist when up due to 1 mo h/o presyncope/dizziness     Comments: otherwise reports able to perform ADLs without assistance, participating with HHPT     Hand Dominance        Extremity/Trunk Assessment   Upper Extremity Assessment: Overall WFL for tasks assessed           Lower Extremity Assessment: Overall WFL for tasks assessed      Cervical / Trunk Assessment: Normal  Communication   Communication: No difficulties  Cognition Arousal/Alertness: Awake/alert Behavior During Therapy: Anxious Overall Cognitive Status: Within Functional Limits for tasks assessed                      General Comments      Exercises        Assessment/Plan    PT Assessment Patient needs continued PT services;All further PT needs can be met in the next venue of care  PT Diagnosis Difficulty walking   PT Problem List Pain;Cardiopulmonary status limiting activity;Decreased mobility;Decreased activity tolerance  PT Treatment Interventions     PT Goals (Current goals can be found in the Care Plan section) Acute Rehab PT Goals Patient Stated Goal: find out what's causing this and fix it PT Goal Formulation: All assessment and education complete, DC therapy    Frequency Other (Comment) (d/c acute PT; refer to HHPT once med issue resolved)   Barriers to discharge   medical status    Co-evaluation               End of Session Equipment Utilized During Treatment: Gait belt Activity Tolerance: Treatment limited secondary to medical complications (Comment) (symptomatic orthostatic hypertension ) Patient left: in bed;with call bell/phone within reach Nurse Communication: Mobility status    Functional Assessment Tool Used: clinical judgement Functional Limitation: Mobility: Walking and moving around Mobility: Walking and Moving Around Current Status (N1916): At least 80 percent but less than 100 percent  impaired, limited or restricted Mobility: Walking and Moving Around Goal Status [phone]): At least 20 percent but less than 40 percent impaired, limited or restricted    Time: 0855-0930 PT Time Calculation (min) (ACUTE ONLY): 35 min   Charges:   PT Evaluation $Initial PT Evaluation Tier I: 1 Procedure     PT G Codes:   PT G-Codes **NOT FOR INPATIENT CLASS** Functional Assessment Tool Used: clinical judgement Functional Limitation: Mobility: Walking and moving around Mobility: Walking and Moving Around Current Status (K5997): At least 80 percent but less than 100 percent impaired, limited or restricted Mobility: Walking and Moving Around Goal Status [phone]): At least 20 percent but less than 40 percent impaired, limited or restricted    Herbie Drape 09/12/2014, 9:47 AM

## 2014-09-12 NOTE — Telephone Encounter (Signed)
ok 

## 2014-09-12 NOTE — Progress Notes (Deleted)
[facility]  Patient Status: Active (up until hospitalization)   [facility] is providing the following services: RN,PT  If patient discharges after hours, please call [phone].   Leslie Houston 09/12/2014, 3:12 PM

## 2014-09-12 NOTE — Progress Notes (Signed)
[facility] PROGRESS NOTE  Leslie Houston [id number] DOB: 04-05-1951 DOA: 09/09/2014 PCP: Bobetta Lime, MD  Assessment/Plan: 1. Recurrent syncope. -Patient undergoing extensive workup for recurrent syncope that has included transthoracic echocardiogram, CTA of head and neck, CT scan of brain without contrast, MRI of brain, EEG which has all been unrevealing. Orthostatics have been negative. Cardiology feeling this is not likely to be cardiac in origin and recommended a neurology consultation. -Ambulated patient today with walker, became weak upon leaving room and required assistance with standing and was sat down on chair. She did not lose consciousness. Blood pressures were checked on sitting with systolic blood pressures in the 140s. -Case was discussed with neurology. It is unlikely that these episodes are present seizure disorder. She has undergone extensive cardiac and neurologic workups that have been unremarkable. Dr Leonel Ramsay did not recommend further neurological work up.  -Unclear etiology  2.  Essential hypertension. -She had not been noted to have episodes of hypotension that would explain current symptoms.  -Continue Norvasc 10 mg by mouth daily  3.  History of CVA. -Repeat MRI of brain not revealing acute intracranial abnormality. No findings on brain imaging to account for present symptoms. -Continue antiplatelet therapy with aspirin 81 mg by mouth daily.  4.  Stage III chronic kidney disease. -Creatinine stable at 1.3.  5. Deconditioning  -Physical therapy was consulted who did not feel patient would benefit from SNF placement and recommended HH with home PT This plan was discussed with family who did not agree with plan.    Code Status: Full code Family Communication: Spoke with family over telephone Disposition Plan: I explained to family PT's recommendations, they are refusing to take her home stating that "there will be consequences" if she goes home  and has another spell. Will discuss this issue further with SW/CM.    Consultants:  Cardiology  Neurology   HPI/Subjective: Patient is a 64 year old female with a past medical history of chronic diastolic congestive heart failure, history of CVA, hypertension, coronary artery disease, having a history recurrent syncope with previous hospitalizations undergoing syncope workup. Workup has included transthoracic echocardiogram performed on 08/22/2014 that showed grade 2 diastolic dysfunction with EF of 55-60%, CTA of head and neck performed on 08/20/2014 that showed stable appearance of 75 month old left PCA territory infarct, 50-60% stenosis of the proximal internal carotid arteries bilaterally, without significant posterior circulation stenosis identified. During this hospitalization she had an MRI of brain performed on 09/09/2014 that did not reveal acute intracranial process. She was evaluated by cardiology, tele strops reviewed. Cardiology did not recommend further cardiac workup.   Objective: Filed Vitals:   09/12/14 0957  BP: 158/65  Pulse:   Temp:   Resp:     Intake/Output Summary (Last 24 hours) at 09/12/14 1249 Last data filed at 09/12/14 1200  Gross per 24 hour  Intake    363 ml  Output      0 ml  Net    363 ml   Filed Weights   09/10/14 0516 09/11/14 0631 09/12/14 0400  Weight: 77.384 kg (170 lb 9.6 oz) 77.384 kg (170 lb 9.6 oz) 76.658 kg (169 lb)    Exam:   General:  Patient is in no acute distress  Cardiovascular: Regular rate and rhythm normal S1-S2 no murmurs rubs or gallops  Respiratory: Normal respiratory effort, lungs are clear to auscultation bilaterally  Abdomen: Soft nontender nondistended  Musculoskeletal: No edema   Data Reviewed: Basic Metabolic Panel:  Recent Labs Lab 09/06/14  1026 09/09/14 1248 09/10/14 0455  NA 140 141 141  K 4.0 3.8 4.2  CL 108 109 106  CO2 24 23 25   GLUCOSE 92 100* 87  BUN 13 17 14   CREATININE 1.29* 1.33* 1.32*   CALCIUM 10.0 9.7 9.6   Liver Function Tests: No results for input(s): AST, ALT, ALKPHOS, BILITOT, PROT, ALBUMIN in the last 168 hours. No results for input(s): LIPASE, AMYLASE in the last 168 hours. No results for input(s): AMMONIA in the last 168 hours. CBC:  Recent Labs Lab 09/06/14 1026 09/09/14 1248 09/10/14 0455  WBC 3.5* 3.8* 3.5*  NEUTROABS 2.3  --   --   HGB 13.4 12.7 13.2  HCT 39.7 37.8 39.6  MCV 94.7 93.3 93.8  PLT 176 177 176   Cardiac Enzymes:  Recent Labs Lab 09/09/14 1900 09/10/14 0455  CKTOTAL 35*  --   TROPONINI <0.03 <0.03   BNP (last 3 results) No results for input(s): BNP in the last 8760 hours.  ProBNP (last 3 results) No results for input(s): PROBNP in the last 8760 hours.  CBG:  Recent Labs Lab 09/10/14 2038 09/11/14 0811 09/11/14 0812 09/11/14 1143 09/11/14 1736  GLUCAP 101* 213* 118* 95 130*    No results found for this or any previous visit (from the past 240 hour(s)).   Studies: No results found.  Scheduled Meds: . amLODipine  10 mg Oral Daily  . aspirin EC  81 mg Oral Daily  . citalopram  10 mg Oral Daily  . enoxaparin (LOVENOX) injection  40 mg Subcutaneous Q24H  . sodium chloride  3 mL Intravenous Q12H   Continuous Infusions:   Active Problems:   History of stroke   CKD (chronic kidney disease) stage 3, GFR 30-59 ml/min   Near syncope   Carotid stenosis   CKD (chronic kidney disease), stage III   Hypertension   Diabetes mellitus   Pain in the chest   Seizure    Time spent: 25 min    Kelvin Cellar  [facility] Pager [phone]. If 7PM-7AM, please contact night-coverage at [email or web address], password [facility] 09/12/2014, 12:49 PM

## 2014-09-13 ENCOUNTER — Telehealth: Payer: Self-pay | Admitting: Physician Assistant

## 2014-09-13 DIAGNOSIS — R569 Unspecified convulsions: Secondary | ICD-10-CM | POA: Insufficient documentation

## 2014-09-13 DIAGNOSIS — R55 Syncope and collapse: Secondary | ICD-10-CM | POA: Diagnosis not present

## 2014-09-13 DIAGNOSIS — N183 Chronic kidney disease, stage 3 (moderate): Secondary | ICD-10-CM | POA: Diagnosis not present

## 2014-09-13 DIAGNOSIS — I10 Essential (primary) hypertension: Secondary | ICD-10-CM | POA: Diagnosis not present

## 2014-09-13 DIAGNOSIS — R079 Chest pain, unspecified: Secondary | ICD-10-CM | POA: Diagnosis not present

## 2014-09-13 NOTE — Consult Note (Addendum)
   [facility] CM Inpatient Consult   09/13/2014  Leslie Houston [birth year] [id number] Patient evaluated for [facility] [employer] Management services. Cross check performed for eligibility in designated delegation for [employer].  Patient is not eligible for [facility] Care Management services. Inpatient RNCM notified.  For any additional questions or new referrals please contact:  Natividad Brood, [facility] Liaison  [phone] business mobile phone

## 2014-09-13 NOTE — Progress Notes (Signed)
Ambulated pt in hallway.  Pt tolerated ambulation well and did not have any signs or symptoms of her episodes.  Will continue to closely monitor.

## 2014-09-13 NOTE — Progress Notes (Signed)
In pt room and pt stated she was having another one of her "episodes."  Pt started to stare off and was unable to communicate verbally.  Episode lasted from 8:59-9:00am.  Pt able to answer orientation questions after episode.  VSS.  MD notified and no new orders received.  Will continue to monitor.

## 2014-09-13 NOTE — Discharge Summary (Signed)
Physician Discharge Summary  Leslie Houston [id number] DOB: [birth year] DOA: 09/09/2014  PCP: Bobetta Lime, MD  Admit date: 09/09/2014 Discharge date: 09/13/2014  Time spent: 35 minutes  Recommendations for Outpatient Follow-up:  1. Patient having recurring presyncopal spells that has resulted in multiple previous hospitalizations, undergoing extensive workups and evaluation by multiple consultants. On this hospitalization both cardiology and neurology evaluated patient and did not have further recommendations. Unclear etiology could be related to anxiety. Physical therapy consulted who recommended home health services with home PT. No further workup recommended, patient discharged. 2. Home health services for home PT/RN set up prior to discharge   Discharge Diagnoses:  Active Problems:   History of stroke   CKD (chronic kidney disease) stage 3, GFR 30-59 ml/min   Near syncope   Carotid stenosis   CKD (chronic kidney disease), stage III   Hypertension   Diabetes mellitus   Pain in the chest   Seizure   Seizure-like activity   Discharge Condition: Stable  Diet recommendation: Heart healthy  Filed Weights   09/11/14 0631 09/12/14 0400 09/13/14 0439  Weight: 77.384 kg (170 lb 9.6 oz) 76.658 kg (169 lb) 76.659 kg (169 lb)    History of present illness:  64 year old female with a history of grade 2 diastolic dysfunction, CVA, HTN, CAD presenting with syncope. Patient noted to have been admitted March 21 of March 24 for syncope. Workup included 2-D echocardiogram which showed grade 2 diastolic dysfunction. CT angiogram of the head and neck negative for any acute infarct though with 50-60% stenosis of the internal carotid arteries bilaterally. Case discussed w/ vascular surgery per report-not felt to be candidate for intervention. Please see discharge summary for full details. The patient was noted to be orthostatic at that time and her blood pressure medicines were held. The  patient has had 2 separate admissions from 08/19/2014 through 08/22/2014 and from 08/23/2014 through 08/26/2014 for near syncope. Repeat orthostatics were negative. Because of the patient's hypertension, the patient was started back on her amlodipine, but her metoprolol and hydralazine were held. The patient was seen by cardiology, and she was set up with a Holter monitor after her discharge on 08/23/2014. Reports from the Holter monitor did not show any significant dysrhythmias. The patient had an EEG which was negative. She was seen by neurology and offered an MRI of the brain, but the patient refused. As result, the patient was discharged in stable condition. She represented to the emergency department with one-day history of seizure-like activity and staring spells which last 30 seconds. Interestingly, during her spells, the patient is able to respond to her environment. There is no loss of consciousness or bowel or bladder incontinence or tongue biting. She denies any fevers, chills, nausea, vomiting, diarrhea, abdominal pain, dysuria, hematuria. She stated that she has had some chest discomfort that started on the morning of admission. She states that this occurs at rest without any exacerbating or alleviating factors. She denies any coughing or hemoptysis. The patient was seen by her primary care provider on the day prior to admission who recommended admission, but the patient refused until today. The patient states that a sister with chest discomfort she has episodes of feeling drunk and her eyes getting crossed. In the emergency department, chest x-ray were negative. The patient was afebrile and hemodynamically stable. BMP and CBC were unremarkable. Urinalysis did not show any pyuria.Point of care troponin was negative. EKG shows sinus rhythm with T-wave inversion in V4-V6 and 2, 3, aVF, but this was  unchanged from previous EKGs  Hospital Course:  Patient is a 64 year old female with a past medical  history of CVA, diastolic dysfunction, admitted to the medicine service on 09/09/2014. She has had multiple recent hospitalizations including ER visits for presyncope undergoing an extensive workup. She was recently admitted on 08/19/2014 and 08/23/2014 for workup of presyncope. This has included multiple CT scans of brain without contrast that have been negative for acute intracranial abnormalities. She had a CT scan of brain with contrast performed on 08/20/2014 showing 50-60% stenosis of the proximal internal carotid arteries bilaterally, stable appearance of old PCA territory infarct. Transthoracic echocardiogram performed on 08/22/2014 showing ejection fraction of 55-60% without wall motion abnormalities, grade 2 diastolic dysfunction. She has had 2 EEGs which have not shown seizure-like activity. Labs have not shown obvious source of infection with unremarkable chest x-ray and urinalysis. During this hospitalization troponins were cycled and remained negative 3 sets. Telemetry did not show changes, specifically bradycardia was not noted during this hospitalization. Orthostatics were checked multiple occasions during this hospitalization remaining negative. If anything it appeared that her blood pressures would increase with standing. She remained in normal sinus rhythm on telemetry. She was evaluated by Dr. Percival Spanish of cardiology  felt that symptoms were not of cardiac origin. MRI of the brain performed on 09/09/2014 show no acute intracranial process. During this hospitalization she was also evaluated by Dr Leonel Ramsay of neurology who evaluated patient on 09/11/2014  after patient had an episode of presyncope. She was able to respond to questions appropriately, there was no tongue biting or urinary incontinence.  No further neurologic workup was recommended. Physical therapy felt she would not benefit from skilled nursing facility placement. During this hospitalization had extensive conversations with  family members and caregivers who had many concerns regarding these episodes representing seizures. I explained these episodes were highly unlikely related to seizures particularly as symptoms appear to be related to standing up and ambulating. Furthermore, neurology did not recommend starting antiepileptic medications. Prior to discharge she was ambulated with nursing staff, did well with assistance.     Consultations:  Neurology   Cardiology  Physical therapy  Discharge Exam: Filed Vitals:   09/13/14 1201  BP: 155/83  Pulse: 72  Temp: 98.4 F (36.9 C)  Resp: 20    General:  patient is in no acute distress, she is awake alert pleasant, does not appear ill, she was ambulated by nursing staff Cardiovascular: regular rate and rhythm normal S1-S2 no murmurs rubs or gallops  Respiratory: normal respiratory effort, lungs are clear to auscultation bilaterally  Abdomen: Soft nontender nondistended  Discharge Instructions   Discharge Instructions    Call MD for:  difficulty breathing, headache or visual disturbances    Complete by:  As directed      Call MD for:  extreme fatigue    Complete by:  As directed      Call MD for:  hives    Complete by:  As directed      Call MD for:  persistant dizziness or light-headedness    Complete by:  As directed      Call MD for:  persistant nausea and vomiting    Complete by:  As directed      Call MD for:  redness, tenderness, or signs of infection (pain, swelling, redness, odor or green/yellow discharge around incision site)    Complete by:  As directed      Call MD for:  severe uncontrolled pain  Complete by:  As directed      Call MD for:  temperature >100.4    Complete by:  As directed      Diet - low sodium heart healthy    Complete by:  As directed      Increase activity slowly    Complete by:  As directed           Current Discharge Medication List    CONTINUE these medications which have NOT CHANGED   Details   acetaminophen (TYLENOL) 325 MG tablet Take 650 mg by mouth every 6 (six) hours as needed.    amLODipine (NORVASC) 10 MG tablet Take 10 mg by mouth daily.    aspirin 81 MG tablet Take 81 mg by mouth daily.    citalopram (CELEXA) 10 MG tablet Take 10 mg by mouth daily.    fluticasone (FLONASE) 50 MCG/ACT nasal spray Place 2 sprays into both nostrils daily as needed for allergies (allergies).     loratadine (CLARITIN) 10 MG tablet Take 10 mg by mouth daily.    lovastatin (MEVACOR) 40 MG tablet Take 40 mg by mouth at bedtime.    ondansetron (ZOFRAN) 4 MG tablet Take 4 mg by mouth every 8 (eight) hours as needed for nausea or vomiting (nausea).       STOP taking these medications     isosorbide mononitrate (IMDUR) 30 MG 24 hr tablet      meclizine (ANTIVERT) 12.5 MG tablet      metoprolol succinate (TOPROL-XL) 25 MG 24 hr tablet      hydrALAZINE (APRESOLINE) 10 MG tablet        No Known Allergies Follow-up Information    Follow up with Lenor Coffin, MD In 1 week.   Specialty:  Neurology   Contact information:   [place] [address] [phone]       Follow up with Bobetta Lime, MD In 1 week.   Contact information:   [address] [place] [address] [place] [address] [phone]       Follow up with Minus Breeding, MD In 2 weeks.   Specialty:  Cardiology   Contact information:   [address] [place] [address] [phone]        The results of significant diagnostics from this hospitalization (including imaging, microbiology, ancillary and laboratory) are listed below for reference.    Significant Diagnostic Studies: Ct Angio Head W/cm &/or Wo Cm  08/20/2014   CLINICAL DATA:  Left PCA infarct 2 months ago. New onset dizziness and presyncopal symptoms upon standing.  EXAM: CT ANGIOGRAPHY HEAD AND NECK  TECHNIQUE: Multidetector CT imaging of the head and neck was performed using the standard protocol during  bolus administration of intravenous contrast. Multiplanar CT image reconstructions and MIPs were obtained to evaluate the vascular anatomy. Carotid stenosis measurements (when applicable) are obtained utilizing NASCET criteria, using the distal internal carotid diameter as the denominator.  CONTRAST:  156mL OMNIPAQUE IOHEXOL 350 MG/ML SOLN  COMPARISON:  None.  CT head without contrast 08/19/2014. MRI brain without contrast 06/22/2014.  FINDINGS: CT HEAD  Brain: The remote left PCA territory infarct is again seen. No acute infarct, hemorrhage, or mass lesion is present. The basal ganglia are intact. Gray-white differentiation is otherwise preserved.  Calvarium and skull base: Negative  Paranasal sinuses: Clear  Orbits: Within normal limits.  CTA NECK  Aortic arch: A common origin of the left common carotid artery and innominate artery are noted.  Right carotid system: The right common carotid artery is within normal limits. Dense atherosclerotic calcifications are present at the aortic arch. Calcified and noncalcified plaque narrows the proximal right internal carotid artery to approximately 50% of the distal lumen. The cervical left ICA scratch the cervical right ICA is tortuous without additional stenoses.  Left carotid system: A left common carotid artery is within normal limits. Calcified and noncalcified plaque narrow the lumen of the proximal left internal carotid artery to 1.9 mm. This compares with 4.1 mm distally. There is mild tortuosity of the cervical left ICA otherwise without additional stenoses.  Vertebral arteries:The vertebral arteries originate from the subclavian arteries bilaterally. The right vertebral artery is the dominant vessel. There are no significant stenoses in the neck scratch the there are no significant vertebral artery stenoses in the neck.  Skeleton: There straightening of the normal cervical lordosis. Mild degenerative changes are present at each level from C2-3 through C6-7. Mild  osseous foraminal narrowing is evident. No focal lytic or blastic lesions are present.  Other neck: Incidental note is made of or foraminal lobe of the thyroid. No discrete lesions are present within the thyroid. There is no significant adenopathy. Salivary glands are within normal limits. No focal mucosal or submucosal lesions are present.  CTA HEAD  Anterior circulation: Minimal atherosclerotic calcifications are present within the cavernous internal carotid arteries bilaterally. There is no significant stenosis. The terminal ICA is normal. A1 and M1 segments are within normal limits. The anterior communicating artery is patent. MCA bifurcations are within normal limits. ACA and MCA branch vessels are normal.  Posterior circulation: The right vertebral artery is slightly dominant to the left. The PICA origins are visualized and normal bilaterally. The vertebrobasilar junction is within normal limits. The basilar artery is normal. Both posterior cerebral arteries originate from the basilar tip. The PCA branch vessels are unremarkable.  Venous sinuses: The dural sinuses are patent. The right transverse sinus is the dominant vessel. The left internal jugular vein and sigmoid sinus are hypoplastic. The straight sinus and deep cerebral veins are patent.  Anatomic variants: None.  Delayed phase:No pathologic enhancement is present.  IMPRESSION: 1. Stable appearance of two-month old left PCA territory infarct. 2. No evidence for acute infarct. 3. 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 4. No significant of posterior circulation stenosis identified. 5. Mild spondylosis of the cervical spine.   Electronically Signed   By: San Morelle M.D.   On: 08/20/2014 17:53   Dg Chest 2 View  09/09/2014   CLINICAL DATA:  Chronic progressive left sided chest pain.  EXAM: CHEST  2 VIEW  COMPARISON:  08/24/2014  FINDINGS: Heart size and pulmonary vascularity are normal and the lungs are clear.  No effusions. No significant osseous abnormality.  IMPRESSION: No active cardiopulmonary disease.   Electronically Signed   By: Lorriane Shire M.D.   On: 09/09/2014 12:59   X-ray Chest Pa And Lateral  08/24/2014   CLINICAL DATA:  Acute onset of syncope.  Initial encounter.  EXAM: CHEST  2 VIEW  COMPARISON:  Chest radiograph performed 08/18/2014  FINDINGS: The lungs are well-aerated. Mild bibasilar atelectasis is noted. There is no evidence of pleural effusion or pneumothorax.  The heart is normal in size; the mediastinal contour is within normal limits. No acute osseous abnormalities are seen.  IMPRESSION: Mild bibasilar atelectasis is noted.  Lungs otherwise clear.   Electronically Signed   By: Garald Balding M.D.   On: 08/24/2014 05:11  Dg Chest 2 View  08/18/2014   CLINICAL DATA:  Epigastric pain.  Nausea and vomiting  EXAM: CHEST  2 VIEW  COMPARISON:  None.  FINDINGS: Mild bibasilar airspace disease most consistent with atelectasis. No definite pneumonia or effusion. Negative for heart failure.  IMPRESSION: Mild bibasilar atelectasis.   Electronically Signed   By: Franchot Gallo M.D.   On: 08/18/2014 12:33   Ct Head Wo Contrast  09/06/2014   CLINICAL DATA:  64 year old diabetic hypertensive female awoke with face fullness and increase blood pressure with headache. Prior stroke. Initial encounter.  EXAM: CT HEAD WITHOUT CONTRAST  TECHNIQUE: Contiguous axial images were obtained from the base of the skull through the vertex without intravenous contrast.  COMPARISON:  08/25/2014.  FINDINGS: No intracranial hemorrhage.  Remote left occipital lobe infarct. No CT evidence of large acute infarct.  No intracranial mass lesion noted on this unenhanced exam.  No hydrocephalus  Mastoid air cells and middle ear cavities as well as this is this is clear. Minimal exophthalmos.  IMPRESSION: Remote left occipital lobe infarct. No CT evidence of large acute infarct.   Electronically Signed   By: Genia Del M.D.   On:  09/06/2014 16:22   Ct Head Wo Contrast  08/25/2014   CLINICAL DATA:  Dizziness, more severe with standing.  EXAM: CT HEAD WITHOUT CONTRAST  TECHNIQUE: Contiguous axial images were obtained from the base of the skull through the vertex without intravenous contrast.  COMPARISON:  August 20, 2014  FINDINGS: Mild diffuse atrophy is stable. There is no intracranial mass, hemorrhage, extra-axial fluid collection, or midline shift. The prior infarct involving the medial to mid left occipital lobe is stable. There is mild patchy small vessel disease in the centra semiovale bilaterally. There is no new gray-white compartment lesion. No acute infarct apparent. The bony calvarium appears intact. The mastoid air cells are clear.  IMPRESSION: Stable left occipital lobe infarct. Mild atrophy with mild periventricular small vessel disease, stable. No intracranial mass, hemorrhage, or acute appearing infarct.   Electronically Signed   By: Lowella Grip III M.D.   On: 08/25/2014 09:09   Ct Angio Neck W/cm &/or Wo/cm  08/20/2014   CLINICAL DATA:  Left PCA infarct 2 months ago. New onset dizziness and presyncopal symptoms upon standing.  EXAM: CT ANGIOGRAPHY HEAD AND NECK  TECHNIQUE: Multidetector CT imaging of the head and neck was performed using the standard protocol during bolus administration of intravenous contrast. Multiplanar CT image reconstructions and MIPs were obtained to evaluate the vascular anatomy. Carotid stenosis measurements (when applicable) are obtained utilizing NASCET criteria, using the distal internal carotid diameter as the denominator.  CONTRAST:  136mL OMNIPAQUE IOHEXOL 350 MG/ML SOLN  COMPARISON:  None.  CT head without contrast 08/19/2014. MRI brain without contrast 06/22/2014.  FINDINGS: CT HEAD  Brain: The remote left PCA territory infarct is again seen. No acute infarct, hemorrhage, or mass lesion is present. The basal ganglia are intact. Gray-white differentiation is otherwise preserved.   Calvarium and skull base: Negative  Paranasal sinuses: Clear  Orbits: Within normal limits.  CTA NECK  Aortic arch: A common origin of the left common carotid artery and innominate artery are noted.  Right carotid system: The right common carotid artery is within normal limits. Dense atherosclerotic calcifications are present at the aortic arch. Calcified and noncalcified plaque narrows the proximal right internal carotid artery to approximately 50% of the distal lumen. The cervical left ICA scratch the cervical right ICA is tortuous without additional  stenoses.  Left carotid system: A left common carotid artery is within normal limits. Calcified and noncalcified plaque narrow the lumen of the proximal left internal carotid artery to 1.9 mm. This compares with 4.1 mm distally. There is mild tortuosity of the cervical left ICA otherwise without additional stenoses.  Vertebral arteries:The vertebral arteries originate from the subclavian arteries bilaterally. The right vertebral artery is the dominant vessel. There are no significant stenoses in the neck scratch the there are no significant vertebral artery stenoses in the neck.  Skeleton: There straightening of the normal cervical lordosis. Mild degenerative changes are present at each level from C2-3 through C6-7. Mild osseous foraminal narrowing is evident. No focal lytic or blastic lesions are present.  Other neck: Incidental note is made of or foraminal lobe of the thyroid. No discrete lesions are present within the thyroid. There is no significant adenopathy. Salivary glands are within normal limits. No focal mucosal or submucosal lesions are present.  CTA HEAD  Anterior circulation: Minimal atherosclerotic calcifications are present within the cavernous internal carotid arteries bilaterally. There is no significant stenosis. The terminal ICA is normal. A1 and M1 segments are within normal limits. The anterior communicating artery is patent. MCA bifurcations are  within normal limits. ACA and MCA branch vessels are normal.  Posterior circulation: The right vertebral artery is slightly dominant to the left. The PICA origins are visualized and normal bilaterally. The vertebrobasilar junction is within normal limits. The basilar artery is normal. Both posterior cerebral arteries originate from the basilar tip. The PCA branch vessels are unremarkable.  Venous sinuses: The dural sinuses are patent. The right transverse sinus is the dominant vessel. The left internal jugular vein and sigmoid sinus are hypoplastic. The straight sinus and deep cerebral veins are patent.  Anatomic variants: None.  Delayed phase:No pathologic enhancement is present.  IMPRESSION: 1. Stable appearance of two-month old left PCA territory infarct. 2. No evidence for acute infarct. 3. 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 4. No significant of posterior circulation stenosis identified. 5. Mild spondylosis of the cervical spine.   Electronically Signed   By: San Morelle M.D.   On: 08/20/2014 17:53   Mr Brain Wo Contrast  09/10/2014   EXAM: MRI HEAD WITHOUT CONTRAST  TECHNIQUE: Multiplanar, multiecho pulse sequences of the brain and surrounding structures were obtained without intravenous contrast.  COMPARISON:  Prior CT from 09/06/2014.  FINDINGS: Mild diffuse cerebral atrophy is present. Scattered patchy and confluent T2/FLAIR hyperintensity within the periventricular and deep white matter is most consistent with chronic small vessel ischemic disease, fairly mild for patient age. Encephalomalacia with gliosis within the left occipital lobe extending anteriorly towards the mesial left temporal lobe is compatible with remote left PCA territory infarct.  Hippocampi are symmetric in appearance with normal size and morphology normal signal intensity.  No abnormal foci of restricted diffusion to suggest acute intracranial infarct identified. Gray-white matter  differentiation otherwise maintained. Normal intravascular flow voids preserved. No acute intracranial hemorrhage.  No mass lesion or midline shift. No hydrocephalus. No extra-axial fluid collection.  Craniocervical junction within normal limits. Mild degenerative changes present within the visualized upper cervical spine. Pituitary gland normal. No acute abnormality about the orbits. Paranasal sinuses are clear. No mastoid effusion. Inner ear structures normal.  Bone marrow signal intensity normal. Scalp soft tissues unremarkable.  IMPRESSION: 1. No acute intracranial process identified. 2. Remote left PCA territory infarct. 3. Mild chronic small vessel ischemic disease involving the supratentorial white matter.  Electronically Signed   By: Jeannine Boga M.D.   On: 09/10/2014 00:18   Ct Abdomen Pelvis W Contrast  08/18/2014   CLINICAL DATA:  64 year old female with epigastric abdominal pain for 3 days with nausea and vomiting. Initial encounter.  EXAM: CT ABDOMEN AND PELVIS WITH CONTRAST  TECHNIQUE: Multidetector CT imaging of the abdomen and pelvis was performed using the standard protocol following bolus administration of intravenous contrast.  CONTRAST:  57mL OMNIPAQUE IOHEXOL 300 MG/ML  SOLN  COMPARISON:  CT Abdomen and Pelvis 04/11/2014.  FINDINGS: Cardiomegaly. No pericardial or pleural effusion. Improved ventilation at the lung bases with less ground-glass and mosaic attenuation. Mild dependent atelectasis now.  Degenerative changes in the spine. No acute osseous abnormality identified.  No pelvic free fluid.  Negative rectum.  Negative uterus and adnexa.  Severe bladder distension. Estimated bladder volume 588 mL. No bladder wall thickening or perivesical stranding.  Negative sigmoid colon. Decompressed and negative left colon. Negative transverse colon with gas and stool. Redundant hepatic flexure. Stool and oral contrast in the right colon. Normal appendix. Negative terminal ileum. No dilated  small bowel. Decompressed stomach and duodenum.  Liver, gallbladder, spleen, pancreas and adrenal glands are within normal limits. The portal venous system is patent. Aortoiliac calcified atherosclerosis noted. Major arterial structures are patent. No abdominal free fluid.  Chronic hydronephrosis and extra renal pelvis. Symmetric renal contrast enhancement and excretion. Bilateral hydroureter without periureteral stranding or urologic calculus. No lymphadenopathy.  IMPRESSION: 1. Severe bladder distension (estimated bladder volume 588 mL) is felt responsible for bilateral hydroureter and hydronephrosis. Query bladder outlet obstruction. 2. No other acute or inflammatory process in the abdomen or pelvis. Normal appendix.   Electronically Signed   By: Genevie Ann M.D.   On: 08/18/2014 16:00   Mr Attempted Daymon Larsen Report  08/20/2014   This examination belongs to an outside facility and is stored  here for comparison purposes only.  Contact the originating outside  institution for any associated report or interpretation.  Ct Head Limited W/o Cm  08/19/2014   CLINICAL DATA:  Dizzy spells for 2 weeks. Presyncope. Recent cerebral infarction.  EXAM: CT HEAD WITHOUT CONTRAST  TECHNIQUE: Contiguous axial images were obtained from the base of the skull through the vertex without intravenous contrast.  COMPARISON:  MR brain 06/22/2014. CT head 06/22/2014. Most recent CT 07/24/2014.  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 No new areas of cerebral infarction are definitely identified.  No hemorrhage, mass lesion, hydrocephalus, or extra-axial fluid. Mild atrophy and small vessel disease. Calvarium intact. No sinus or mastoid disease. No change from March scan.  IMPRESSION: Chronic LEFT occipital infarct.  No acute features are evident.   Electronically Signed   By: Rolla Flatten M.D.   On: 08/19/2014 13:39     Microbiology: No results found for this or any previous visit (from the past 240 hour(s)).   Labs: Basic Metabolic Panel:  Recent Labs Lab 09/09/14 1248 09/10/14 0455  NA 141 141  K 3.8 4.2  CL 109 106  CO2 23 25  GLUCOSE 100* 87  BUN 17 14  CREATININE 1.33* 1.32*  CALCIUM 9.7 9.6   Liver Function Tests: No results for input(s): AST, ALT, ALKPHOS, BILITOT, PROT, ALBUMIN in the last 168 hours. No results for input(s): LIPASE, AMYLASE in the last 168 hours. No results for input(s): AMMONIA in the last 168 hours. CBC:  Recent Labs Lab 09/09/14 1248 09/10/14  0455  WBC 3.8* 3.5*  HGB 12.7 13.2  HCT 37.8 39.6  MCV 93.3 93.8  PLT 177 176   Cardiac Enzymes:  Recent Labs Lab 09/09/14 1900 09/10/14 0455  CKTOTAL 35*  --   TROPONINI <0.03 <0.03   BNP: BNP (last 3 results) No results for input(s): BNP in the last 8760 hours.  ProBNP (last 3 results) No results for input(s): PROBNP in the last 8760 hours.  CBG:  Recent Labs Lab 09/10/14 2038 09/11/14 0811 09/11/14 0812 09/11/14 1143 09/11/14 1736  GLUCAP 101* 213* 118* 95 130*       Signed:  Omarrion Carmer  [facility] 09/13/2014, 2:27 PM

## 2014-09-13 NOTE — Telephone Encounter (Signed)
S/w Leslie Houston and her friend Izora Gala, Leslie Houston is being released from hospital today, but they wanted some results about Leslie Houston's monitor. I asked Leslie Houston since she is in the hospital with monitor on, what did they find at the hospital, she said "they could not find any reason why she is having these episodes".  I stated Leslie Houston still has monitor on and it is not complete, nor has it been read yet by Dr. Gwenlyn Found, Leslie Houston's cardiologist. I explained to Leslie Houston and her friend if there was something truly life threatening the monitor company would call her and call us as well. This seemed to reassure the Leslie Houston. I advised once monitor is complete and read by Dr. Gwenlyn Found his nurse will call results. Both Leslie Houston and friend said ok and thank you.

## 2014-09-13 NOTE — Telephone Encounter (Signed)
New message     Want monitor results 

## 2014-09-13 NOTE — Care Management Note (Addendum)
Case Management Note  Patient Details  Name: Leslie Houston MRN: [id number] Date of Birth: 10/22/1950  Subjective/Objective:      Plan is for d/c today.               Action/Plan: CM did speak with pt and received permission to contact son while at HD today. HD days for son are M,W, F- per son pt's daughter will not be able to stay during the day. CM did ask about other family members. Son unable to give CM an answer. Pt received calls from multiple family members- that seemed concerned while CM was in the room. At this time SNF is not an option. Pt does not have 3 day qualifying stay and PT recommendations reflect Home with [facility] services.  CM did speak with son in regards that the plan is for d/c home today. Son did state that he may take pt to [place] did state that [facility] services will be resumed with [facility]. Personal Care Provider list given to pt and [name] at bedside. CM did speak with son and pt in regards to [facility] and that pt may be eligible for Aide services, However family would need to call to set up services and to see if eligible. CM and MD spent extensive time in pt's room discussing plan of care. CM did call [facility] and [place] to see if pt was eligible for services in the community. [facility] is aware that pt will be d/c today, unsure if pt will go home vs another acute care setting. No further needs from CM at this time.   1525 09-13-14 Received call from [facility]. Pt will receive resources from the [place] due to PCP is in [place]. No further needs at this time.   Expected Discharge Date:                  Expected Discharge Plan:  [place]  In-House Referral:  Clinical Social Work  Discharge planning Services  CM Consult  Post Acute Care Choice:  Resumption of Svcs/PTA Provider Choice offered to:  Patient  DME Arranged:    DME Agency:     HH Arranged:   RN, PT, Nurse's Aide [place] Agency:  [place]  Status of Service:  Completed, signed off  Medicare Important Message Given:  No Date Medicare IM Given:    Medicare IM give by:    Date Additional Medicare IM Given:    Additional Medicare Important Message give by:     If discussed at [place] of Stay Meetings, dates discussed:    Additional Comments:  Bethena Roys, RN 09/13/2014, 2:34 PM

## 2014-09-13 NOTE — Clinical Social Work Note (Signed)
Clinical Social Work Assessment  Patient Details  Name: Leslie Houston MRN: [id number] Date of Birth: [birth year]  Date of referral:  09/13/14               Reason for consult:  Discharge Planning                Permission sought to share information with:  Family Supports Permission granted to share information::  Yes, Verbal Permission Granted  Name::     Jennika Rosenstiel  Agency::     Relationship::  Son  Contact Information:  [phone]  Housing/Transportation Living arrangements for the past 2 months:  [place] of Information:  Patient, Adult Children Patient Interpreter Needed:  None Criminal Activity/Legal Involvement Pertinent to Current Situation/Hospitalization:  No - Comment as needed Significant Relationships:  Adult Children, Community Support, Friend Lives with:  Adult Children Do you feel safe going back to the place where you live?  Yes Need for family participation in patient care:  Yes (Comment)  Care giving concerns:  Pt requiring 24 hr supervision at home   Social Worker assessment / plan:  CSW spoke with pt at bedside about recommendation. Pt believes her son and family friend [name] will be able to arrange 24hr care. CSW asked if CSW could speak with pt son to confirm, pt agreeable. CSW spoke with pt son who informed CSW that he feels we have not found the source of pt issues and are not treating her. CSW offered support and advised pt son to speak with nurse or MD about concerns. Pt son is aware that recommendation is for home with 24 hr supervision. He states he is at home with pt except when he goes to dialysis. CSW asked pt son to begin looking at options for when he is not with pt. Pt son understanding but continued to be hesitant about discharge because of above concerns. Pt active with [facility]. RNCM notified. CSW paged MD to update. CSW signing off.   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   Patient/Family's Understanding of and Emotional Response to Diagnosis, Current Treatment, and Prognosis:  Pt and pt son with limited understanding of pt condition. Pt son upset/concerned about current treatment plan.  Emotional Assessment Appearance:  Appears stated age, Well-Groomed Attitude/Demeanor/Rapport:  Other (Cooperative) Affect (typically observed):  Calm, Quiet Orientation:  Oriented to Self, Oriented to Place, Oriented to  Time, Oriented to Situation Alcohol / Substance use:  Not Applicable Psych involvement (Current and /or in the community):  No (Comment)  Discharge Needs  Concerns to be addressed:  Discharge Planning Concerns Readmission within the last 30 days:  No Current discharge risk:  Other (Medical Issues) Barriers to Discharge:  Continued Medical Work up, [employer], [place]

## 2014-09-14 DIAGNOSIS — I5030 Unspecified diastolic (congestive) heart failure: Secondary | ICD-10-CM | POA: Diagnosis not present

## 2014-09-14 DIAGNOSIS — I129 Hypertensive chronic kidney disease with stage 1 through stage 4 chronic kidney disease, or unspecified chronic kidney disease: Secondary | ICD-10-CM | POA: Diagnosis not present

## 2014-09-14 DIAGNOSIS — R339 Retention of urine, unspecified: Secondary | ICD-10-CM | POA: Diagnosis not present

## 2014-09-14 DIAGNOSIS — R42 Dizziness and giddiness: Secondary | ICD-10-CM | POA: Diagnosis not present

## 2014-09-14 DIAGNOSIS — I69351 Hemiplegia and hemiparesis following cerebral infarction affecting right dominant side: Secondary | ICD-10-CM | POA: Diagnosis not present

## 2014-09-14 DIAGNOSIS — N183 Chronic kidney disease, stage 3 (moderate): Secondary | ICD-10-CM | POA: Diagnosis not present

## 2014-09-15 ENCOUNTER — Encounter: Payer: Self-pay | Admitting: Neurology

## 2014-09-15 ENCOUNTER — Ambulatory Visit (INDEPENDENT_AMBULATORY_CARE_PROVIDER_SITE_OTHER): Payer: Medicare Other | Admitting: Neurology

## 2014-09-15 VITALS — BP 173/86 | HR 74 | Ht 61.0 in | Wt 164.0 lb

## 2014-09-15 DIAGNOSIS — R569 Unspecified convulsions: Secondary | ICD-10-CM | POA: Diagnosis not present

## 2014-09-15 DIAGNOSIS — I639 Cerebral infarction, unspecified: Secondary | ICD-10-CM | POA: Diagnosis not present

## 2014-09-15 MED ORDER — LEVETIRACETAM 500 MG PO TABS: 500.0000 mg | ORAL_TABLET | Freq: Two times a day (BID) | ORAL | Status: DC

## 2014-09-15 NOTE — Progress Notes (Signed)
PATIENT: Leslie Houston DOB: November 24, 1950  Chief Complaint  Patient presents with  . Seizures    She had seizures as a child but she was told she grew out ot them.  She started having them again after a stroke in January 2016.  She is here with her caregiver, [name], who has one her episodes recorded.  She has been to the hospital mulltiple times for these events.  She has not been placed on any anticonvulsants.    HISTORICAL  Leslie Houston is a 64 yo RH female with her care giver [name], her PCP is 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, require prolonged ICU stay, mild mental delay, went to tenth grade, worked at the tobacco farm, never drive a car also had a history of coronary artery disease, cardiac stent, hypertension, hyperlipidemia,  She had a history of epilepsy, generalized tonic-clonic seizure when she was young, since her most recent left occipital stroke early 2016, she began to have frequent spells, smelling dust smell, eyes crossed, transient confusion , Sometimes fell to the ground, lasting less than a minute, she can have few spells in one day, AND last for a few days without recurrence,   She presented hospital multiple times for similar presentation, I have reviewed MRI of brain in May 2016, large left occipital stroke, mild small vessel disease at supratentorium.   Laboratory showed normal BMP, with exception of creatinine 1.3, mild low WBC 3.8  Echocardiogram April 2016, normal ejection fraction 55-60%, no significant structural abnormality,  She is now wearing a cardiac monitoring,   REVIEW OF SYSTEMS: Full 14 system review of systems performed and notable only for weight loss, palpitation, blurry vision, loss of vision, eye pain, urination problems, allergies, headaches, weakness, dizziness, seizures, passing out, anxiety, change in appetite  ALLERGIES: No Known Allergies    HOME MEDICATIONS: Current Outpatient Prescriptions  Medication Sig Dispense Refill  . acetaminophen (TYLENOL) 325 MG tablet Take 650 mg by mouth every 6 (six) hours as needed.    [name] amLODipine (NORVASC) 10 MG tablet Take 10 mg by mouth daily.    [name] aspirin 81 MG tablet Take 81 mg by mouth daily.    . citalopram (CELEXA) 10 MG tablet Take 10 mg by mouth daily.    . fluticasone (FLONASE) 50 MCG/ACT nasal spray Place 2 sprays into both nostrils daily as needed for allergies (allergies).     . loratadine (CLARITIN) 10 MG tablet Take 10 mg by mouth daily.    [name] lovastatin (MEVACOR) 40 MG tablet Take 40 mg by mouth at bedtime.    . ondansetron (ZOFRAN) 4 MG tablet Take 4 mg by mouth every 8 (eight) hours as needed for nausea or vomiting (nausea).       PAST MEDICAL HISTORY: Past Medical History  Diagnosis Date  . Stroke     a. L PCA CVA in 05/2014.  . Diabetes mellitus   . Hypertension   . CKD (chronic kidney disease), stage III   . Sinus bradycardia   . PVC's (premature ventricular contractions)   . Orthostatic hypotension   . Carotid stenosis     a. CT angio head 07/2014 - 50-60% stenoses of prox ICA bilaterally.  . PUD (peptic ulcer disease)   . Syncope   . Seizures   . CAD (coronary artery disease)     a. Pt reports in 2000 she had  a stent to a coronary artery, details unclear, outside hospital.  . CHF (congestive heart failure)     a. Pt reports in 2000 she had CHF, details unclear, outside hospital.    PAST SURGICAL HISTORY: Past Surgical History  Procedure Laterality Date  . Cesarean section    . Dental surgery    . Heart stent      FAMILY HISTORY: Family History  Problem Relation Age of Onset  . Stroke Mother   . Stroke Father   . Prostate cancer Father   . Diabetes Mellitus II Daughter   . Heart attack Mother   . Colon cancer Father   . Multiple sclerosis Daughter   . Kidney disease Son   . Cancer Paternal Grandfather   . Lung cancer Mother     SOCIAL  HISTORY:  History   Social History  . Marital Status: Single    Spouse Name: N/A  . Number of Children: 2  . Years of Education: 12   Occupational History  . Disabled    Social History Main Topics  . Smoking status: Former Smoker    Quit date: 03/30/2014  . Smokeless tobacco: Not on file  . Alcohol Use: No  . Drug Use: No  . Sexual Activity: Not on file   Other Topics Concern  . Not on file   Social History Narrative   Lives at home with her son.   Right-handed.   1 cup coffee per day.     PHYSICAL EXAM   Filed Vitals:   09/15/14 0749  BP: 173/86  Pulse: 74  Height: 5\' 1"  (1.549 m)  Weight: 164 lb (74.39 kg)    Not recorded      Body mass index is 31 kg/(m^2).  PHYSICAL EXAMNIATION:  Gen: NAD, conversant, well nourised, obese, well groomed                     Cardiovascular: Regular rate rhythm, no peripheral edema, warm, nontender. Eyes: Conjunctivae clear without exudates or hemorrhage Neck: Supple, no carotid bruise. Pulmonary: Clear to auscultation bilaterally   NEUROLOGICAL EXAM:  MENTAL STATUS: Speech:    Speech is normal; fluent and spontaneous with normal comprehension.  Cognition:    The patient is oriented to person, place, and time;       CRANIAL NERVES: CN II: Visual fields are full to confrontation. Fundoscopic exam is normal with sharp discs and no vascular changes. Venous pulsations are present bilaterally. Pupils are 4 mm and briskly reactive to light. Visual acuity is 20/20 bilaterally. CN III, IV, VI: extraocular movement are normal. No ptosis. CN V: Facial sensation is intact to pinprick in all 3 divisions bilaterally. Corneal responses are intact.  CN VII: Face is symmetric with normal eye closure and smile. CN VIII: Hearing is normal to rubbing fingers CN IX, X: Palate elevates symmetrically. Phonation is normal. CN XI: Head turning and shoulder shrug are intact CN XII: Tongue is midline with normal movements and no  atrophy.  MOTOR: There is no pronator drift of out-stretched arms. Muscle bulk and tone are normal. Muscle strength is normal.  REFLEXES: Reflexes are 2+ and symmetric at the biceps, triceps, knees, and ankles. Plantar responses are flexor.  SENSORY: Light touch, pinprick, position sense, and vibration sense are intact in fingers and toes.  COORDINATION: Rapid alternating movements and fine finger movements are intact. There is no dysmetria on finger-to-nose and heel-knee-shin. There are no abnormal or extraneous movements.   GAIT/STANCE: Need  to push up to get up from seated position, wide based, mildly unsteady   DIAGNOSTIC DATA (LABS, IMAGING, TESTING) - I reviewed patient records, labs, notes, testing and imaging myself where available.  Lab Results  Component Value Date   WBC 3.5* 09/10/2014   HGB 13.2 09/10/2014   HCT 39.6 09/10/2014   MCV 93.8 09/10/2014   PLT 176 09/10/2014      Component Value Date/Time   NA 141 09/10/2014 0455   NA 142 08/19/2014 0822   K 4.2 09/10/2014 0455   K 4.1 08/19/2014 0822   CL 106 09/10/2014 0455   CL 109 08/19/2014 0822   CO2 25 09/10/2014 0455   CO2 26 08/19/2014 0822   GLUCOSE 87 09/10/2014 0455   GLUCOSE 110* 08/19/2014 0822   BUN 14 09/10/2014 0455   BUN 16 08/19/2014 0822   CREATININE 1.32* 09/10/2014 0455   CREATININE 1.39* 08/19/2014 0822   CALCIUM 9.6 09/10/2014 0455   CALCIUM 10.2 08/19/2014 0822   PROT 6.6 08/26/2014 0432   PROT 7.3 07/24/2014 0915   ALBUMIN 3.5 08/26/2014 0432   ALBUMIN 4.1 07/24/2014 0915   AST 16 08/26/2014 0432   AST 31 07/24/2014 0915   ALT 18 08/26/2014 0432   ALT 29 07/24/2014 0915   ALKPHOS 49 08/26/2014 0432   ALKPHOS 53 07/24/2014 0915   BILITOT 0.6 08/26/2014 0432   GFRNONAA 41* 09/10/2014 0455   GFRNONAA 40* 08/19/2014 0822   GFRAA 48* 09/10/2014 0455   GFRAA 46* 08/19/2014 0822   No results found for: CHOL, HDL, LDLCALC, LDLDIRECT, TRIG, CHOLHDL Lab Results  Component Value  Date   HGBA1C 5.1 08/24/2014   No results found for: [id number] Lab Results  Component Value Date   TSH 1.351 08/24/2014      ASSESSMENT AND PLAN  Deyanara Dircks is a 64 y.o. female with history of epilepsy, left occipital stroke, now presenting with frequent spells, consistent with complex partial seizures.  1, EEG 2, start Keppra 500 mg twice a day 3, document all events 4, return to clinic in one month    Marcial Pacas, M.D. Ph.D.  [facility] [address] [place], [place] [address] Ph: [phone] Fax: [phone]

## 2014-09-16 DIAGNOSIS — R42 Dizziness and giddiness: Secondary | ICD-10-CM | POA: Diagnosis not present

## 2014-09-16 DIAGNOSIS — I129 Hypertensive chronic kidney disease with stage 1 through stage 4 chronic kidney disease, or unspecified chronic kidney disease: Secondary | ICD-10-CM | POA: Diagnosis not present

## 2014-09-16 DIAGNOSIS — N183 Chronic kidney disease, stage 3 (moderate): Secondary | ICD-10-CM | POA: Diagnosis not present

## 2014-09-16 DIAGNOSIS — I69351 Hemiplegia and hemiparesis following cerebral infarction affecting right dominant side: Secondary | ICD-10-CM | POA: Diagnosis not present

## 2014-09-16 DIAGNOSIS — I5030 Unspecified diastolic (congestive) heart failure: Secondary | ICD-10-CM | POA: Diagnosis not present

## 2014-09-16 DIAGNOSIS — R339 Retention of urine, unspecified: Secondary | ICD-10-CM | POA: Diagnosis not present

## 2014-09-21 DIAGNOSIS — I129 Hypertensive chronic kidney disease with stage 1 through stage 4 chronic kidney disease, or unspecified chronic kidney disease: Secondary | ICD-10-CM | POA: Diagnosis not present

## 2014-09-21 DIAGNOSIS — N183 Chronic kidney disease, stage 3 (moderate): Secondary | ICD-10-CM | POA: Diagnosis not present

## 2014-09-21 DIAGNOSIS — I5030 Unspecified diastolic (congestive) heart failure: Secondary | ICD-10-CM | POA: Diagnosis not present

## 2014-09-21 DIAGNOSIS — R42 Dizziness and giddiness: Secondary | ICD-10-CM | POA: Diagnosis not present

## 2014-09-21 DIAGNOSIS — R339 Retention of urine, unspecified: Secondary | ICD-10-CM | POA: Diagnosis not present

## 2014-09-21 DIAGNOSIS — I69351 Hemiplegia and hemiparesis following cerebral infarction affecting right dominant side: Secondary | ICD-10-CM | POA: Diagnosis not present

## 2014-09-23 DIAGNOSIS — N398 Other specified disorders of urinary system: Secondary | ICD-10-CM | POA: Diagnosis not present

## 2014-09-23 DIAGNOSIS — N179 Acute kidney failure, unspecified: Secondary | ICD-10-CM | POA: Diagnosis not present

## 2014-09-23 DIAGNOSIS — N189 Chronic kidney disease, unspecified: Secondary | ICD-10-CM | POA: Diagnosis not present

## 2014-09-23 DIAGNOSIS — N183 Chronic kidney disease, stage 3 (moderate): Secondary | ICD-10-CM | POA: Diagnosis not present

## 2014-09-23 DIAGNOSIS — I129 Hypertensive chronic kidney disease with stage 1 through stage 4 chronic kidney disease, or unspecified chronic kidney disease: Secondary | ICD-10-CM | POA: Diagnosis not present

## 2014-09-23 DIAGNOSIS — R42 Dizziness and giddiness: Secondary | ICD-10-CM | POA: Diagnosis not present

## 2014-09-23 DIAGNOSIS — R339 Retention of urine, unspecified: Secondary | ICD-10-CM | POA: Diagnosis not present

## 2014-09-23 DIAGNOSIS — I5030 Unspecified diastolic (congestive) heart failure: Secondary | ICD-10-CM | POA: Diagnosis not present

## 2014-09-23 DIAGNOSIS — I69351 Hemiplegia and hemiparesis following cerebral infarction affecting right dominant side: Secondary | ICD-10-CM | POA: Diagnosis not present

## 2014-09-24 NOTE — ED Provider Notes (Signed)
CSN: [id number]     Arrival date & time 09/09/14  1123 History   First MD Initiated Contact with Patient 09/09/14 1125     Chief Complaint  Patient presents with  . Chest Pain  . Seizures     (Consider location/radiation/quality/duration/timing/severity/associated sxs/prior Treatment) HPI Patient presents to the emergency department with chest pain and seizures.  Patient apparently had a seizure while she was at home today on several occasions the patient did not fully lose consciousness, but her eyes rolled back in her head and she began to shake.  The family states they called EMS.  When she had a prolonged unresponsive episode.  He states that she woke up with chest pain in the center of her chest.  This is been ongoing for several weeks.  The patient denies headache, blurred vision, back pain, neck pain, shortness of breath, cough, fever, weakness, dizziness, abdominal pain, nausea, vomiting, diarrhea, or syncope.  Patient states that nothing seems make her condition better or worse.  The family is concerned because she is having increasing seizure activity  chest pain since Past Medical History  Diagnosis Date  . Stroke     a. L PCA CVA in 05/2014.  . Diabetes mellitus   . Hypertension   . CKD (chronic kidney disease), stage III   . Sinus bradycardia   . PVC's (premature ventricular contractions)   . Orthostatic hypotension   . Carotid stenosis     a. CT angio head 07/2014 - 50-60% stenoses of prox ICA bilaterally.  . PUD (peptic ulcer disease)   . Syncope   . Seizures   . CAD (coronary artery disease)     a. Pt reports in 2000 she had a stent to a coronary artery, details unclear, outside hospital.  . CHF (congestive heart failure)     a. Pt reports in 2000 she had CHF, details unclear, outside hospital.   Past Surgical History  Procedure Laterality Date  . Cesarean section    . Dental surgery    . Stint    . Heart stent     Family History  Problem Relation Age of Onset   . Stroke Mother   . Stroke Father   . Prostate cancer Father   . Diabetes Mellitus II Daughter   . Heart attack Mother   . Colon cancer Father   . Multiple sclerosis Daughter   . Kidney disease Son   . Cancer Paternal Grandfather   . Lung cancer Mother    History  Substance Use Topics  . Smoking status: Former Smoker    Quit date: 03/30/2014  . Smokeless tobacco: Not on file  . Alcohol Use: No   OB History    No data available     Review of Systems All other systems negative except as documented in the HPI. All pertinent positives and negatives as reviewed in the HPI.  Allergies  Review of patient's allergies indicates no known allergies.  Home Medications   Prior to Admission medications   Medication Sig Start Date End Date Taking? Authorizing Provider  acetaminophen (TYLENOL) 325 MG tablet Take 650 mg by mouth every 6 (six) hours as needed.   Yes Historical Provider, MD  amLODipine (NORVASC) 10 MG tablet Take 10 mg by mouth daily.   Yes Historical Provider, MD  aspirin 81 MG tablet Take 81 mg by mouth daily.   Yes Historical Provider, MD  citalopram (CELEXA) 10 MG tablet Take 10 mg by mouth daily.   Yes  Historical Provider, MD  fluticasone (FLONASE) 50 MCG/ACT nasal spray Place 2 sprays into both nostrils daily as needed for allergies (allergies).    Yes Historical Provider, MD  loratadine (CLARITIN) 10 MG tablet Take 10 mg by mouth daily.   Yes Historical Provider, MD  lovastatin (MEVACOR) 40 MG tablet Take 40 mg by mouth at bedtime.   Yes Historical Provider, MD  ondansetron (ZOFRAN) 4 MG tablet Take 4 mg by mouth every 8 (eight) hours as needed for nausea or vomiting (nausea).    Yes Historical Provider, MD  levETIRAcetam (KEPPRA) 500 MG tablet Take 1 tablet (500 mg total) by mouth 2 (two) times daily. 09/15/14   Marcial Pacas, MD   BP 155/83 mmHg  Pulse 72  Temp(Src) 98.4 F (36.9 C) (Oral)  Resp 20  Ht 5\' 1"  (1.549 m)  Wt 169 lb (76.659 kg)  BMI 31.95 kg/m2  SpO2  99% Physical Exam  Constitutional: She is oriented to person, place, and time. She appears well-developed and well-nourished. No distress.  HENT:  Head: Normocephalic and atraumatic.  Mouth/Throat: Oropharynx is clear and moist. No oropharyngeal exudate.  Eyes: Pupils are equal, round, and reactive to light.  Neck: Normal range of motion. Neck supple.  Cardiovascular: Normal rate, regular rhythm, normal heart sounds and intact distal pulses.  Exam reveals no gallop and no friction rub.   No murmur heard. Pulmonary/Chest: Effort normal and breath sounds normal. No respiratory distress.  Neurological: She is alert and oriented to person, place, and time. She exhibits normal muscle tone. Coordination normal.  Skin: Skin is warm and dry. No rash noted. No erythema.  Nursing note and vitals reviewed.   ED Course  Procedures (including critical care time) Labs Review Labs Reviewed  CBC - Abnormal; Notable for the following:    WBC 3.8 (*)    All other components within normal limits  BASIC METABOLIC PANEL - Abnormal; Notable for the following:    Glucose, Bld 100 (*)    Creatinine, Ser 1.33 (*)    GFR calc non Af Amer 41 (*)    GFR calc Af Amer 47 (*)    All other components within normal limits  BASIC METABOLIC PANEL - Abnormal; Notable for the following:    Creatinine, Ser 1.32 (*)    GFR calc non Af Amer 41 (*)    GFR calc Af Amer 48 (*)    All other components within normal limits  CBC - Abnormal; Notable for the following:    WBC 3.5 (*)    All other components within normal limits  CK - Abnormal; Notable for the following:    Total CK 35 (*)    All other components within normal limits  GLUCOSE, CAPILLARY - Abnormal; Notable for the following:    Glucose-Capillary 104 (*)    All other components within normal limits  GLUCOSE, CAPILLARY - Abnormal; Notable for the following:    Glucose-Capillary 101 (*)    All other components within normal limits  GLUCOSE, CAPILLARY -  Abnormal; Notable for the following:    Glucose-Capillary 213 (*)    All other components within normal limits  GLUCOSE, CAPILLARY - Abnormal; Notable for the following:    Glucose-Capillary 118 (*)    All other components within normal limits  GLUCOSE, CAPILLARY - Abnormal; Notable for the following:    Glucose-Capillary 130 (*)    All other components within normal limits  URINALYSIS, ROUTINE W REFLEX MICROSCOPIC  TROPONIN I  TROPONIN I  PROLACTIN  URINE RAPID DRUG SCREEN (HOSP PERFORMED)  GLUCOSE, CAPILLARY  GLUCOSE, CAPILLARY  GLUCOSE, CAPILLARY  GLUCOSE, CAPILLARY  I-STAT TROPOININ, ED    Imaging Review No results found.   EKG Interpretation   Date/Time:  Thursday Sep 09 2014 11:27:05 EDT Ventricular Rate:  68 PR Interval:  218 QRS Duration: 97 QT Interval:  407 QTC Calculation: 433 R Axis:   59 Text Interpretation:  Sinus rhythm Borderline prolonged PR interval  Probable LVH with secondary repol abnrm No significant change since last  tracing Confirmed by POLLINA  MD, Yesha Muchow [phone]) on 09/09/2014 2:16:40  PM     Patient be admitted to the hospital for further evaluation and care.  Spoke with the [facility] who will be down to evaluate the patient    Dalia Heading, PA-C 09/24/14 0143  Orpah Greek, MD 09/25/14 2021

## 2014-09-28 ENCOUNTER — Ambulatory Visit (INDEPENDENT_AMBULATORY_CARE_PROVIDER_SITE_OTHER): Payer: Medicare Other | Admitting: Neurology

## 2014-09-28 DIAGNOSIS — R569 Unspecified convulsions: Secondary | ICD-10-CM | POA: Diagnosis not present

## 2014-09-28 DIAGNOSIS — N183 Chronic kidney disease, stage 3 (moderate): Secondary | ICD-10-CM | POA: Diagnosis not present

## 2014-09-28 DIAGNOSIS — R42 Dizziness and giddiness: Secondary | ICD-10-CM | POA: Diagnosis not present

## 2014-09-28 DIAGNOSIS — R339 Retention of urine, unspecified: Secondary | ICD-10-CM | POA: Diagnosis not present

## 2014-09-28 DIAGNOSIS — I639 Cerebral infarction, unspecified: Secondary | ICD-10-CM

## 2014-09-28 DIAGNOSIS — I69351 Hemiplegia and hemiparesis following cerebral infarction affecting right dominant side: Secondary | ICD-10-CM | POA: Diagnosis not present

## 2014-09-28 DIAGNOSIS — I5030 Unspecified diastolic (congestive) heart failure: Secondary | ICD-10-CM | POA: Diagnosis not present

## 2014-09-28 DIAGNOSIS — I129 Hypertensive chronic kidney disease with stage 1 through stage 4 chronic kidney disease, or unspecified chronic kidney disease: Secondary | ICD-10-CM | POA: Diagnosis not present

## 2014-09-29 ENCOUNTER — Telehealth: Payer: Self-pay | Admitting: *Deleted

## 2014-09-29 NOTE — Telephone Encounter (Signed)
Pt requested Refill Isosorbide mononitrate 30 mg . Pt did not know that medication was D/C at last hospitalization on 09/09/14. Pt is aware that we can not call in medication due to it being d/c and MD would have to review and decide if we should send in. Pt understanding and is ok with seeing Dr. And being advised once she comes in for appointment.

## 2014-09-29 NOTE — Telephone Encounter (Signed)
Pt daughter is asking if we can refill this medication  Called PCP and they would not fill it.  Pt is going to be coming June 16 th   1. Which medications need to be refilled?   2. Which pharmacy is medication to be sent to? [facility]   3. Do they need a 30 day or 90 day supply?   4. Would they like a call back once the medication has been sent to the pharmacy? Yes  Pt took her last one today. Would like to know if we can fill it.

## 2014-09-30 DIAGNOSIS — N183 Chronic kidney disease, stage 3 (moderate): Secondary | ICD-10-CM | POA: Diagnosis not present

## 2014-09-30 DIAGNOSIS — R42 Dizziness and giddiness: Secondary | ICD-10-CM | POA: Diagnosis not present

## 2014-09-30 DIAGNOSIS — I129 Hypertensive chronic kidney disease with stage 1 through stage 4 chronic kidney disease, or unspecified chronic kidney disease: Secondary | ICD-10-CM | POA: Diagnosis not present

## 2014-09-30 DIAGNOSIS — R339 Retention of urine, unspecified: Secondary | ICD-10-CM | POA: Diagnosis not present

## 2014-09-30 DIAGNOSIS — I5030 Unspecified diastolic (congestive) heart failure: Secondary | ICD-10-CM | POA: Diagnosis not present

## 2014-09-30 DIAGNOSIS — I69351 Hemiplegia and hemiparesis following cerebral infarction affecting right dominant side: Secondary | ICD-10-CM | POA: Diagnosis not present

## 2014-10-01 DIAGNOSIS — D631 Anemia in chronic kidney disease: Secondary | ICD-10-CM | POA: Diagnosis not present

## 2014-10-01 DIAGNOSIS — I10 Essential (primary) hypertension: Secondary | ICD-10-CM | POA: Diagnosis not present

## 2014-10-01 DIAGNOSIS — N2581 Secondary hyperparathyroidism of renal origin: Secondary | ICD-10-CM | POA: Diagnosis not present

## 2014-10-01 DIAGNOSIS — N183 Chronic kidney disease, stage 3 (moderate): Secondary | ICD-10-CM | POA: Diagnosis not present

## 2014-10-01 NOTE — Procedures (Signed)
   HISTORY: 64 yo female with history of left occipital stroke, also history of epilepsy since young  TECHNIQUE:  16 channel EEG was performed based on standard 10-16 international system. One channel was dedicated to EKG, which has demonstrates sinus rhythm of 54 beats per minutes.  Upon awakening, the posterior background activity was well-developed, with mixed alpha and beta range activities, reactive to eye opening and closure.  There was no evidence of epileptiform discharge.  Photic stimulation was performed, which induced a symmetric photic driving.  Hyperventilation was performed, there was no abnormality elicit.  No sleep was achieved.  CONCLUSION: This is a  normal awake EEG.  There is no electrodiagnostic evidence of epileptiform discharge

## 2014-10-05 ENCOUNTER — Telehealth: Payer: Self-pay | Admitting: Cardiology

## 2014-10-05 DIAGNOSIS — R42 Dizziness and giddiness: Secondary | ICD-10-CM | POA: Diagnosis not present

## 2014-10-05 DIAGNOSIS — R339 Retention of urine, unspecified: Secondary | ICD-10-CM | POA: Diagnosis not present

## 2014-10-05 DIAGNOSIS — N183 Chronic kidney disease, stage 3 (moderate): Secondary | ICD-10-CM | POA: Diagnosis not present

## 2014-10-05 DIAGNOSIS — I69351 Hemiplegia and hemiparesis following cerebral infarction affecting right dominant side: Secondary | ICD-10-CM | POA: Diagnosis not present

## 2014-10-05 DIAGNOSIS — I5030 Unspecified diastolic (congestive) heart failure: Secondary | ICD-10-CM | POA: Diagnosis not present

## 2014-10-05 DIAGNOSIS — I129 Hypertensive chronic kidney disease with stage 1 through stage 4 chronic kidney disease, or unspecified chronic kidney disease: Secondary | ICD-10-CM | POA: Diagnosis not present

## 2014-10-07 DIAGNOSIS — R339 Retention of urine, unspecified: Secondary | ICD-10-CM | POA: Diagnosis not present

## 2014-10-07 DIAGNOSIS — N183 Chronic kidney disease, stage 3 (moderate): Secondary | ICD-10-CM | POA: Diagnosis not present

## 2014-10-07 DIAGNOSIS — I5030 Unspecified diastolic (congestive) heart failure: Secondary | ICD-10-CM | POA: Diagnosis not present

## 2014-10-07 DIAGNOSIS — I129 Hypertensive chronic kidney disease with stage 1 through stage 4 chronic kidney disease, or unspecified chronic kidney disease: Secondary | ICD-10-CM | POA: Diagnosis not present

## 2014-10-07 DIAGNOSIS — R42 Dizziness and giddiness: Secondary | ICD-10-CM | POA: Diagnosis not present

## 2014-10-07 DIAGNOSIS — I69351 Hemiplegia and hemiparesis following cerebral infarction affecting right dominant side: Secondary | ICD-10-CM | POA: Diagnosis not present

## 2014-10-12 ENCOUNTER — Encounter (HOSPITAL_COMMUNITY): Payer: Self-pay | Admitting: Emergency Medicine

## 2014-10-12 ENCOUNTER — Emergency Department (HOSPITAL_COMMUNITY)
Admission: EM | Admit: 2014-10-12 | Discharge: 2014-10-12 | Disposition: A | Discharge status: Home / Self Care | Payer: Medicare Other | Attending: Emergency Medicine | Admitting: Emergency Medicine

## 2014-10-12 DIAGNOSIS — R110 Nausea: Secondary | ICD-10-CM | POA: Diagnosis not present

## 2014-10-12 DIAGNOSIS — Z8673 Personal history of transient ischemic attack (TIA), and cerebral infarction without residual deficits: Secondary | ICD-10-CM | POA: Diagnosis not present

## 2014-10-12 DIAGNOSIS — Z8719 Personal history of other diseases of the digestive system: Secondary | ICD-10-CM | POA: Insufficient documentation

## 2014-10-12 DIAGNOSIS — Z87891 Personal history of nicotine dependence: Secondary | ICD-10-CM | POA: Insufficient documentation

## 2014-10-12 DIAGNOSIS — N183 Chronic kidney disease, stage 3 (moderate): Secondary | ICD-10-CM | POA: Insufficient documentation

## 2014-10-12 DIAGNOSIS — G40909 Epilepsy, unspecified, not intractable, without status epilepticus: Secondary | ICD-10-CM | POA: Insufficient documentation

## 2014-10-12 DIAGNOSIS — I129 Hypertensive chronic kidney disease with stage 1 through stage 4 chronic kidney disease, or unspecified chronic kidney disease: Secondary | ICD-10-CM | POA: Insufficient documentation

## 2014-10-12 DIAGNOSIS — Z7982 Long term (current) use of aspirin: Secondary | ICD-10-CM | POA: Diagnosis not present

## 2014-10-12 DIAGNOSIS — R42 Dizziness and giddiness: Secondary | ICD-10-CM | POA: Diagnosis not present

## 2014-10-12 DIAGNOSIS — I2510 Atherosclerotic heart disease of native coronary artery without angina pectoris: Secondary | ICD-10-CM | POA: Diagnosis not present

## 2014-10-12 DIAGNOSIS — R404 Transient alteration of awareness: Secondary | ICD-10-CM | POA: Diagnosis not present

## 2014-10-12 DIAGNOSIS — Z79899 Other long term (current) drug therapy: Secondary | ICD-10-CM | POA: Diagnosis not present

## 2014-10-12 DIAGNOSIS — E119 Type 2 diabetes mellitus without complications: Secondary | ICD-10-CM | POA: Diagnosis not present

## 2014-10-12 DIAGNOSIS — I509 Heart failure, unspecified: Secondary | ICD-10-CM | POA: Diagnosis not present

## 2014-10-12 DIAGNOSIS — R51 Headache: Secondary | ICD-10-CM | POA: Insufficient documentation

## 2014-10-12 DIAGNOSIS — R531 Weakness: Secondary | ICD-10-CM | POA: Diagnosis not present

## 2014-10-12 LAB — CBC WITH DIFFERENTIAL/PLATELET
Basophils Absolute: 0 10*3/uL (ref 0.0–0.1)
Basophils Relative: 0 % (ref 0–1)
Eosinophils Absolute: 0 10*3/uL (ref 0.0–0.7)
Eosinophils Relative: 1 % (ref 0–5)
HCT: 38 % (ref 36.0–46.0)
Hemoglobin: 13 g/dL (ref 12.0–15.0)
Lymphocytes Relative: 20 % (ref 12–46)
Lymphs Abs: 0.7 10*3/uL (ref 0.7–4.0)
MCH: 31.9 pg (ref 26.0–34.0)
MCHC: 34.2 g/dL (ref 30.0–36.0)
MCV: 93.4 fL (ref 78.0–100.0)
Monocytes Absolute: 0.2 10*3/uL (ref 0.1–1.0)
Monocytes Relative: 6 % (ref 3–12)
Neutro Abs: 2.6 10*3/uL (ref 1.7–7.7)
Neutrophils Relative %: 73 % (ref 43–77)
Platelets: 161 10*3/uL (ref 150–400)
RBC: 4.07 MIL/uL (ref 3.87–5.11)
RDW: 12.2 % (ref 11.5–15.5)
WBC: 3.5 10*3/uL — ABNORMAL LOW (ref 4.0–10.5)

## 2014-10-12 LAB — URINALYSIS, ROUTINE W REFLEX MICROSCOPIC
Bilirubin Urine: NEGATIVE
Glucose, UA: NEGATIVE mg/dL
Hgb urine dipstick: NEGATIVE
Ketones, ur: NEGATIVE mg/dL
Leukocytes, UA: NEGATIVE
Nitrite: NEGATIVE
Protein, ur: NEGATIVE mg/dL
Specific Gravity, Urine: 1.009 (ref 1.005–1.030)
Urobilinogen, UA: 0.2 mg/dL (ref 0.0–1.0)
pH: 5.5 (ref 5.0–8.0)

## 2014-10-12 LAB — COMPREHENSIVE METABOLIC PANEL
ALT: 12 U/L — ABNORMAL LOW (ref 14–54)
AST: 15 U/L (ref 15–41)
Albumin: 3.5 g/dL (ref 3.5–5.0)
Alkaline Phosphatase: 52 U/L (ref 38–126)
Anion gap: 7 (ref 5–15)
BUN: 17 mg/dL (ref 6–20)
CO2: 26 mmol/L (ref 22–32)
Calcium: 9.5 mg/dL (ref 8.9–10.3)
Chloride: 110 mmol/L (ref 101–111)
Creatinine, Ser: 1.23 mg/dL — ABNORMAL HIGH (ref 0.44–1.00)
GFR calc Af Amer: 52 mL/min — ABNORMAL LOW (ref >60)
GFR calc non Af Amer: 45 mL/min — ABNORMAL LOW (ref >60)
Glucose, Bld: 104 mg/dL — ABNORMAL HIGH (ref 65–99)
Potassium: 3.8 mmol/L (ref 3.5–5.1)
Sodium: 143 mmol/L (ref 135–145)
Total Bilirubin: 0.4 mg/dL (ref 0.3–1.2)
Total Protein: 6.5 g/dL (ref 6.5–8.1)

## 2014-10-12 MED ORDER — ACETAMINOPHEN 500 MG PO TABS
1000.0000 mg | ORAL_TABLET | Freq: Once | ORAL | Status: AC
  Administered 2014-10-12: 1000 mg via ORAL
  Filled 2014-10-12: qty 2

## 2014-10-12 MED ORDER — SODIUM CHLORIDE 0.9 % IV BOLUS (SEPSIS)
500.0000 mL | Freq: Once | INTRAVENOUS | Status: AC
  Administered 2014-10-12: 500 mL via INTRAVENOUS

## 2014-10-12 MED ORDER — ONDANSETRON 4 MG PO TBDP: 8.0000 mg | ORAL_TABLET | Freq: Once | ORAL | Status: DC

## 2014-10-12 MED ORDER — LEVETIRACETAM 500 MG PO TABS
500.0000 mg | ORAL_TABLET | Freq: Once | ORAL | Status: AC
  Administered 2014-10-12: 500 mg via ORAL
  Filled 2014-10-12 (×2): qty 1

## 2014-10-12 NOTE — ED Notes (Signed)
Dr. Goldston at bedside.  

## 2014-10-12 NOTE — Discharge Instructions (Signed)
Please contact her primary care provider inform them of your visit today. Please schedule follow-up appointment the next 2-3 days. Please monitor for new or worsening signs or symptoms, return immediately if any present.

## 2014-10-12 NOTE — Telephone Encounter (Signed)
Pt. Has an appointment with Dr. Rockey Situ in [place] on 10-14-14

## 2014-10-12 NOTE — ED Provider Notes (Signed)
CSN: [id number]     Arrival date & time 10/12/14  [id number] History   First MD Initiated Contact with Patient 10/12/14 [phone]     Chief Complaint  Patient presents with  . Weakness  . Nausea  . Headache   HPI   64 year old female with a history of seizures presents today reporting she feels like she is going to have a seizure. Patient notes that she woke up this morning slowly developed a "strange" sensation that she felt before previous seizures. She notes she was having a mild headache and associated nausea. She reports a history of stroke. Pt reports that the headache is similar to previous, usually resolve with Tylenol or ibuprofen, no radiation of symptoms, no red flags. Patient reports the nausea has been persistent since her stroke, and this is normal for her, she feels symptomatic improvement with oral antinausea medication this morning. Patient reports that she's been seen in the ED and admitted multiple times previously for similar presentations, with completely normal workups. Patient has no acute concerns addition to those noted above. She denies fever, chills, chest pain, shortness of breath, diaphoresis, back pain, lower extremity swelling or edema, abdominal pain, changes in urine color clarity or characteristics, reports she's been eating and drinking normal, normal bowel movements.  Following information obtained from most recent hospital admission-  "Patient is a 65 year old female with a past medical history of CVA, diastolic dysfunction, admitted to the medicine service on 09/09/2014. She has had multiple recent hospitalizations including ER visits for presyncope undergoing an extensive workup. She was recently admitted on 08/19/2014 and 08/23/2014 for workup of presyncope. This has included multiple CT scans of brain without contrast that have been negative for acute intracranial abnormalities. She had a CT scan of brain with contrast performed on 08/20/2014 showing 50-60% stenosis of the  proximal internal carotid arteries bilaterally, stable appearance of old PCA territory infarct. Transthoracic echocardiogram performed on 08/22/2014 showing ejection fraction of 55-60% without wall motion abnormalities, grade 2 diastolic dysfunction. She has had 2 EEGs which have not shown seizure-like activity"  Past Medical History  Diagnosis Date  . Stroke     a. L PCA CVA in 05/2014.  . Diabetes mellitus   . Hypertension   . CKD (chronic kidney disease), stage III   . Sinus bradycardia   . PVC's (premature ventricular contractions)   . Orthostatic hypotension   . Carotid stenosis     a. CT angio head 07/2014 - 50-60% stenoses of prox ICA bilaterally.  . PUD (peptic ulcer disease)   . Syncope   . Seizures   . CAD (coronary artery disease)     a. Pt reports in 2000 she had a stent to a coronary artery, details unclear, outside hospital.  . CHF (congestive heart failure)     a. Pt reports in 2000 she had CHF, details unclear, outside hospital.   Past Surgical History  Procedure Laterality Date  . Cesarean section    . Dental surgery    . Stint    . Heart stent     Family History  Problem Relation Age of Onset  . Stroke Mother   . Stroke Father   . Prostate cancer Father   . Diabetes Mellitus II Daughter   . Heart attack Mother   . Colon cancer Father   . Multiple sclerosis Daughter   . Kidney disease Son   . Cancer Paternal Grandfather   . Lung cancer Mother    History  Substance Use  Topics  . Smoking status: Former Smoker    Quit date: 03/30/2014  . Smokeless tobacco: Not on file  . Alcohol Use: No   OB History    No data available     Review of Systems  All other systems reviewed and are negative.   Allergies  Review of patient's allergies indicates no known allergies.  Home Medications   Prior to Admission medications   Medication Sig Start Date End Date Taking? Authorizing Provider  acetaminophen (TYLENOL) 325 MG tablet Take 650 mg by mouth every 6  (six) hours as needed.   Yes Historical Provider, MD  amLODipine (NORVASC) 10 MG tablet Take 10 mg by mouth daily.   Yes Historical Provider, MD  aspirin 81 MG tablet Take 81 mg by mouth daily.   Yes Historical Provider, MD  citalopram (CELEXA) 10 MG tablet Take 10 mg by mouth daily.   Yes Historical Provider, MD  fluticasone (FLONASE) 50 MCG/ACT nasal spray Place 2 sprays into both nostrils daily as needed for allergies (allergies).    Yes Historical Provider, MD  levETIRAcetam (KEPPRA) 500 MG tablet Take 1 tablet (500 mg total) by mouth 2 (two) times daily. 09/15/14  Yes Marcial Pacas, MD  loratadine (CLARITIN) 10 MG tablet Take 10 mg by mouth daily.   Yes Historical Provider, MD  lovastatin (MEVACOR) 40 MG tablet Take 40 mg by mouth at bedtime.   Yes Historical Provider, MD  Meclizine HCl 25 MG CHEW Chew 1 tablet by mouth as needed (nausea; motion sickness).   Yes Historical Provider, MD  PRESCRIPTION MEDICATION Take 25 mg by mouth 3 (three) times daily as needed (motion sickness).   Yes Historical Provider, MD   BP 133/87 mmHg  Pulse 52  Temp(Src) 97.9 F (36.6 C) (Oral)  Resp 18  Ht 5\' 1"  (1.549 m)  Wt 164 lb (74.39 kg)  BMI 31.00 kg/m2  SpO2 95% Physical Exam  Constitutional: She is oriented to person, place, and time. She appears well-developed and well-nourished.  HENT:  Head: Normocephalic and atraumatic.  Mouth/Throat: Oropharynx is clear and moist.  Eyes: Conjunctivae are normal. Pupils are equal, round, and reactive to light. Right eye exhibits no discharge. Left eye exhibits no discharge. No scleral icterus.  Neck: Normal range of motion. Neck supple. No JVD present. No tracheal deviation present.  Cardiovascular: Regular rhythm, normal heart sounds and intact distal pulses.   Pulmonary/Chest: Effort normal. No stridor.  Abdominal: Soft. She exhibits no distension and no mass. There is no tenderness. There is no rebound and no guarding.  Musculoskeletal: Normal range of motion.  She exhibits no edema or tenderness.  Neurological: She is alert and oriented to person, place, and time. No cranial nerve deficit or sensory deficit. Coordination normal.  Patient has decreased right-sided strength compared to left, 4-5 right hip flexion, 5 out of 5 upper extremity strength including grip. Sensation intact throughout. Patient has no dysarthria, reduced vision to her right eye, this is baseline. Remainder of cranial nerve functioning normal. Alert and oriented, with intact recall.  Skin: Skin is warm and dry.  Psychiatric: She has a normal mood and affect. Her behavior is normal. Judgment and thought content normal.  Nursing note and vitals reviewed.   ED Course  Procedures (including critical care time) Labs Review Labs Reviewed  CBC WITH DIFFERENTIAL/PLATELET - Abnormal; Notable for the following:    WBC 3.5 (*)    All other components within normal limits  COMPREHENSIVE METABOLIC PANEL - Abnormal; Notable for  the following:    Glucose, Bld 104 (*)    Creatinine, Ser 1.23 (*)    ALT 12 (*)    GFR calc non Af Amer 45 (*)    GFR calc Af Amer 52 (*)    All other components within normal limits  URINALYSIS, ROUTINE W REFLEX MICROSCOPIC (NOT AT [facility])    Imaging Review No results found.   EKG Interpretation   Date/Time:  Tuesday October 12 2014 08:29:42 EDT Ventricular Rate:  54 PR Interval:  213 QRS Duration: 101 QT Interval:  437 QTC Calculation: 414 R Axis:   52 Text Interpretation:  Sinus rhythm Borderline prolonged PR interval  Borderline T abnormalities, diffuse leads Baseline wander in lead(s) III  aVF no significant change since May 2016 Confirmed by Regenia Skeeter  MD, SCOTT  ([id number]) on 10/12/2014 8:49:31 AM      MDM   Final diagnoses:  Weakness    Labs: CBC CBC, CMP, urinalysis- significant for elevated creatinine, normal for patient she is aware of this  Imaging: None indicated  Consults: None  Therapeutics: Keppra, normal saline,  Tylenol  Assessment: Patient presents with weakness today. Patient has been seen previously for the same, she reports that she's feels that she may have a seizure. Patient's labs, vital signs, physical exam showed no significant findings, patient was evaluated in the ED and monitored with no seizure activity. Patient reports she is feeling closer to baseline with some baseline fatigue. No acute findings or concerns at time of discharge. She'll be discharged home with instructions contact her primary care provider follow-up schedule evaluation in 2-3 days. She is instructed monitor for new or worsening signs or symptoms and return to emergency room immediately if any present. Patient verbalizes her understanding and agreement a splint and no further questions or concerns.        Okey Regal, PA-C 10/12/14 1731  Sherwood Gambler, MD 10/13/14 [phone]

## 2014-10-12 NOTE — ED Notes (Signed)
EMS - Patient coming from home with c/o weakness, headache and nausea ongoing since 06:00.  Family stated to EMS these were the cardinal signs prior to patient having seizure.  Patient takes Andres Labrum at home and has not taken her dose today.  Patient is alert and oriented.  Stroke in December with deficits of occassional headaches and partial right eye blindness and seizures diagnosed 2 months ago.

## 2014-10-13 ENCOUNTER — Encounter: Payer: Self-pay | Admitting: *Deleted

## 2014-10-13 DIAGNOSIS — N183 Chronic kidney disease, stage 3 (moderate): Secondary | ICD-10-CM | POA: Diagnosis not present

## 2014-10-13 DIAGNOSIS — I129 Hypertensive chronic kidney disease with stage 1 through stage 4 chronic kidney disease, or unspecified chronic kidney disease: Secondary | ICD-10-CM | POA: Diagnosis not present

## 2014-10-13 DIAGNOSIS — R339 Retention of urine, unspecified: Secondary | ICD-10-CM | POA: Diagnosis not present

## 2014-10-13 DIAGNOSIS — R42 Dizziness and giddiness: Secondary | ICD-10-CM | POA: Diagnosis not present

## 2014-10-13 DIAGNOSIS — I5030 Unspecified diastolic (congestive) heart failure: Secondary | ICD-10-CM | POA: Diagnosis not present

## 2014-10-13 DIAGNOSIS — I69351 Hemiplegia and hemiparesis following cerebral infarction affecting right dominant side: Secondary | ICD-10-CM | POA: Diagnosis not present

## 2014-10-14 ENCOUNTER — Institutional Professional Consult (permissible substitution): Payer: Medicare Other | Admitting: Cardiovascular Disease

## 2014-10-14 ENCOUNTER — Encounter: Payer: Self-pay | Admitting: *Deleted

## 2014-10-21 ENCOUNTER — Telehealth: Payer: Self-pay | Admitting: *Deleted

## 2014-10-21 DIAGNOSIS — R339 Retention of urine, unspecified: Secondary | ICD-10-CM | POA: Diagnosis not present

## 2014-10-21 DIAGNOSIS — N183 Chronic kidney disease, stage 3 (moderate): Secondary | ICD-10-CM | POA: Diagnosis not present

## 2014-10-21 DIAGNOSIS — R42 Dizziness and giddiness: Secondary | ICD-10-CM | POA: Diagnosis not present

## 2014-10-21 DIAGNOSIS — I5030 Unspecified diastolic (congestive) heart failure: Secondary | ICD-10-CM | POA: Diagnosis not present

## 2014-10-21 DIAGNOSIS — I129 Hypertensive chronic kidney disease with stage 1 through stage 4 chronic kidney disease, or unspecified chronic kidney disease: Secondary | ICD-10-CM | POA: Diagnosis not present

## 2014-10-21 DIAGNOSIS — I69351 Hemiplegia and hemiparesis following cerebral infarction affecting right dominant side: Secondary | ICD-10-CM | POA: Diagnosis not present

## 2014-10-21 NOTE — Telephone Encounter (Signed)
Dr Gwenlyn Found reviewed the monitor strips from the cardiac monitor that she wore.  It showed normal sinus rhythm.  I contacted patient and spoke with her son Leslie Houston), who is on the dpr release, and gave him the monitor results.  He voiced understanding and said that he would relay the info to [name].

## 2014-10-26 ENCOUNTER — Encounter: Payer: Self-pay | Admitting: Neurology

## 2014-10-26 ENCOUNTER — Ambulatory Visit (INDEPENDENT_AMBULATORY_CARE_PROVIDER_SITE_OTHER): Payer: Medicare Other | Admitting: Neurology

## 2014-10-26 VITALS — BP 155/75 | HR 63 | Ht 61.0 in | Wt 167.0 lb

## 2014-10-26 DIAGNOSIS — R519 Headache, unspecified: Secondary | ICD-10-CM

## 2014-10-26 DIAGNOSIS — R569 Unspecified convulsions: Secondary | ICD-10-CM

## 2014-10-26 DIAGNOSIS — I5030 Unspecified diastolic (congestive) heart failure: Secondary | ICD-10-CM | POA: Diagnosis not present

## 2014-10-26 DIAGNOSIS — I639 Cerebral infarction, unspecified: Secondary | ICD-10-CM | POA: Diagnosis not present

## 2014-10-26 DIAGNOSIS — R339 Retention of urine, unspecified: Secondary | ICD-10-CM | POA: Diagnosis not present

## 2014-10-26 DIAGNOSIS — R51 Headache: Secondary | ICD-10-CM

## 2014-10-26 DIAGNOSIS — I129 Hypertensive chronic kidney disease with stage 1 through stage 4 chronic kidney disease, or unspecified chronic kidney disease: Secondary | ICD-10-CM | POA: Diagnosis not present

## 2014-10-26 DIAGNOSIS — R42 Dizziness and giddiness: Secondary | ICD-10-CM | POA: Diagnosis not present

## 2014-10-26 DIAGNOSIS — N183 Chronic kidney disease, stage 3 (moderate): Secondary | ICD-10-CM | POA: Diagnosis not present

## 2014-10-26 DIAGNOSIS — I69351 Hemiplegia and hemiparesis following cerebral infarction affecting right dominant side: Secondary | ICD-10-CM | POA: Diagnosis not present

## 2014-10-26 MED ORDER — TOPIRAMATE 100 MG PO TABS: 100.0000 mg | ORAL_TABLET | Freq: Two times a day (BID) | ORAL | Status: DC

## 2014-10-26 NOTE — Progress Notes (Signed)
Chief Complaint  Patient presents with  . Seizures    She is here with her caregiver, Leslie Houston.  She is reporting one episode of seizure-like activity on 10/12/14.  She had not missed any medication.  She would like to discuss her EEG results.      PATIENT: Leslie Houston DOB: [id number]  Chief Complaint  Patient presents with  . Seizures    She is here with her caregiver, Leslie Houston.  She is reporting one episode of seizure-like activity on 10/12/14.  She had not missed any medication.  She would like to discuss her EEG results.    HISTORICAL  Leslie Houston is a 64 yo RH female with her care giver Leslie Houston, her PCP is 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 also had a history of coronary artery disease, cardiac stent, hypertension, hyperlipidemia,  She had a history of epilepsy, generalized tonic-clonic seizure when she was young, since her most recent left occipital stroke early 2016, she began to have frequent spells, smelling dust smell, eyes crossed, transient confusion , Sometimes fell to the ground, lasting less than a minute, she can have few spells in one day, AND last for a few days without recurrence,   She presented hospital multiple times for similar presentation, I have reviewed MRI of brain in May 2016, large left occipital stroke, mild small vessel disease at supratentorium.   Laboratory showed normal BMP, with exception of creatinine 1.3, mild low WBC 3.8  Echocardiogram April 2016, normal ejection fraction 55-60%, no significant structural abnormality,  She is now wearing a cardiac monitoring,   UPDATE June 28th 2016: She is with her care giver Leslie Houston, She only had one episode of seizure like event, sudden onset of confusion, instead of daily similar episode since she started taking  Keppra 500 mg twice a day, she has frequent almost daily headaches, We have reviewed EEG result, that was normal in June 2016  I have reviewed MRI of brain scan with patient, chronic left occipital stroke, supratentorium small vessel disease, no acute lesions. REVIEW OF SYSTEMS: Full 14 system review of systems performed and notable only for weight loss, palpitation, blurry vision, loss of vision, eye pain, urination problems, allergies, headaches, weakness, dizziness, seizures, passing out, anxiety, change in appetite  ALLERGIES: No Known Allergies   HOME MEDICATIONS: Current Outpatient Prescriptions  Medication Sig Dispense Refill  . acetaminophen (TYLENOL) 325 MG tablet Take 650 mg by mouth every 6 (six) hours as needed.    [name] amLODipine (NORVASC) 10 MG tablet Take 10 mg by mouth daily.    [name] aspirin 81 MG tablet Take 81 mg by mouth daily.    . citalopram (CELEXA) 10 MG tablet Take 10 mg by mouth daily.    . fluticasone (FLONASE) 50 MCG/ACT nasal spray Place 2 sprays into both nostrils daily as needed for allergies (allergies).     . loratadine (CLARITIN) 10 MG tablet Take 10 mg by mouth daily.    [name] lovastatin (MEVACOR) 40 MG tablet Take 40 mg by mouth at bedtime.    . ondansetron (ZOFRAN) 4 MG tablet Take 4 mg by mouth every 8 (eight) hours as needed for nausea or vomiting (nausea).       PAST MEDICAL HISTORY: Past Medical History  Diagnosis Date  . Stroke  a. L PCA CVA in 05/2014.  . Diabetes mellitus   . Hypertension   . CKD (chronic kidney disease), stage III   . Sinus bradycardia   . PVC's (premature ventricular contractions)   . Orthostatic hypotension   . Carotid stenosis     a. CT angio head 07/2014 - 50-60% stenoses of prox ICA bilaterally.  . PUD (peptic ulcer disease)   . Syncope   . Seizures   . CAD (coronary artery disease)     a. Pt reports in 2000 she had a stent to a coronary artery, details unclear, outside hospital.  . CHF (congestive heart failure)     a.  Pt reports in 2000 she had CHF, details unclear, outside hospital.  . Gout     PAST SURGICAL HISTORY: Past Surgical History  Procedure Laterality Date  . Cesarean section    . Dental surgery    . Heart stent      FAMILY HISTORY: Family History  Problem Relation Age of Onset  . Stroke Mother   . Stroke Father   . Prostate cancer Father   . Diabetes Mellitus II Daughter   . Heart attack Mother   . Colon cancer Father   . Multiple sclerosis Daughter   . Kidney disease Son   . Cancer Paternal Grandfather   . Lung cancer Mother     SOCIAL HISTORY:  History   Social History  . Marital Status: Single    Spouse Name: N/A  . Number of Children: 2  . Years of Education: 12   Occupational History  . Disabled    Social History Main Topics  . Smoking status: Former Smoker    Quit date: 03/30/2014  . Smokeless tobacco: Not on file  . Alcohol Use: No  . Drug Use: No  . Sexual Activity: Not on file   Other Topics Concern  . Not on file   Social History Narrative   Lives at home with her son.   Right-handed.   1 cup coffee per day.     PHYSICAL EXAM   Filed Vitals:   10/26/14 1436  BP: 155/75  Pulse: 63  Height: 5\' 1"  (1.549 m)  Weight: 167 lb (75.751 kg)    Not recorded      Body mass index is 31.57 kg/(m^2).  PHYSICAL EXAMNIATION:  Gen: NAD, conversant, well nourised, obese, well groomed                     Cardiovascular: Regular rate rhythm, no peripheral edema, warm, nontender. Eyes: Conjunctivae clear without exudates or hemorrhage Neck: Supple, no carotid bruise. Pulmonary: Clear to auscultation bilaterally   NEUROLOGICAL EXAM:  MENTAL STATUS: Speech:    Speech is normal; fluent and spontaneous with normal comprehension.  Cognition:    The patient is oriented to person, place, and time;       CRANIAL NERVES: CN II: Visual fields are full to confrontation. Pupils are equal round reactive to light CN III, IV, VI: extraocular movement are  normal. No ptosis. CN V: Facial sensation is intact to pinprick in all 3 divisions bilaterally. Corneal responses are intact.  CN VII: Face is symmetric with normal eye closure and smile. CN VIII: Hearing is normal to rubbing fingers CN IX, X: Palate elevates symmetrically. Phonation is normal. CN XI: Head turning and shoulder shrug are intact CN XII: Tongue is midline with normal movements and no atrophy.  MOTOR: There is no pronator drift of out-stretched arms.  Muscle bulk and tone are normal. Muscle strength is normal.  REFLEXES: Reflexes are 2+ and symmetric at the biceps, triceps, knees, and ankles. Plantar responses are flexor.  SENSORY: Light touch, pinprick, position sense, and vibration sense are intact in fingers and toes.  COORDINATION: Rapid alternating movements and fine finger movements are intact. There is no dysmetria on finger-to-nose and heel-knee-shin. There are no abnormal or extraneous movements.   GAIT/STANCE: Need to push up to get up from seated position, wide based, mildly unsteady   DIAGNOSTIC DATA (LABS, IMAGING, TESTING) - I reviewed patient records, labs, notes, testing and imaging myself where available.  Lab Results  Component Value Date   WBC 3.5* 10/12/2014   HGB 13.0 10/12/2014   HCT 38.0 10/12/2014   MCV 93.4 10/12/2014   PLT 161 10/12/2014      Component Value Date/Time   NA 143 10/12/2014 0945   NA 142 08/19/2014 0822   K 3.8 10/12/2014 0945   K 4.1 08/19/2014 0822   CL 110 10/12/2014 0945   CL 109 08/19/2014 0822   CO2 26 10/12/2014 0945   CO2 26 08/19/2014 0822   GLUCOSE 104* 10/12/2014 0945   GLUCOSE 110* 08/19/2014 0822   BUN 17 10/12/2014 0945   BUN 16 08/19/2014 0822   CREATININE 1.23* 10/12/2014 0945   CREATININE 1.39* 08/19/2014 0822   CALCIUM 9.5 10/12/2014 0945   CALCIUM 10.2 08/19/2014 0822   PROT 6.5 10/12/2014 0945   PROT 7.3 07/24/2014 0915   ALBUMIN 3.5 10/12/2014 0945   ALBUMIN 4.1 07/24/2014 0915   AST 15  10/12/2014 0945   AST 31 07/24/2014 0915   ALT 12* 10/12/2014 0945   ALT 29 07/24/2014 0915   ALKPHOS 52 10/12/2014 0945   ALKPHOS 53 07/24/2014 0915   BILITOT 0.4 10/12/2014 0945   GFRNONAA 45* 10/12/2014 0945   GFRNONAA 40* 08/19/2014 0822   GFRAA 52* 10/12/2014 0945   GFRAA 46* 08/19/2014 0822   No results found for: CHOL, HDL, LDLCALC, LDLDIRECT, TRIG, CHOLHDL Lab Results  Component Value Date   HGBA1C 5.1 08/24/2014   No results found for: [id number] Lab Results  Component Value Date   TSH 1.351 08/24/2014      ASSESSMENT AND PLAN  Leslie Houston is a 64 y.o. female with history of epilepsy, left occipital stroke, now presenting with frequent spells, consistent with complex partial seizures.  complex partial seizure,  She still has occasional recurrent episode while taking Keppra 500 mg twice a day, she also has anxiety, frequent headaches will change to Topamax 100 mg twice a day  Migraine headaches:   When necessary NSAIDs  Left occipital stroke:  Daily aspirin  Marcial Pacas, M.D. Ph.D.  [facility] [address], [place], [place] [address] Ph: [phone] Fax: [phone]

## 2014-10-27 ENCOUNTER — Ambulatory Visit: Payer: Medicare Other | Admitting: Physical Therapy

## 2014-11-04 ENCOUNTER — Ambulatory Visit: Payer: Medicare Other | Admitting: Physical Therapy

## 2014-11-08 ENCOUNTER — Ambulatory Visit: Payer: Medicare Other | Admitting: Physical Therapy

## 2014-11-15 ENCOUNTER — Encounter (HOSPITAL_COMMUNITY): Payer: Self-pay | Admitting: Emergency Medicine

## 2014-11-15 ENCOUNTER — Emergency Department (HOSPITAL_COMMUNITY)
Admission: EM | Admit: 2014-11-15 | Discharge: 2014-11-15 | Disposition: A | Discharge status: Home / Self Care | Payer: Medicare Other | Attending: Physician Assistant | Admitting: Physician Assistant

## 2014-11-15 ENCOUNTER — Emergency Department (HOSPITAL_COMMUNITY): Payer: Medicare Other

## 2014-11-15 DIAGNOSIS — Z79899 Other long term (current) drug therapy: Secondary | ICD-10-CM | POA: Insufficient documentation

## 2014-11-15 DIAGNOSIS — I129 Hypertensive chronic kidney disease with stage 1 through stage 4 chronic kidney disease, or unspecified chronic kidney disease: Secondary | ICD-10-CM | POA: Diagnosis not present

## 2014-11-15 DIAGNOSIS — Z8711 Personal history of peptic ulcer disease: Secondary | ICD-10-CM | POA: Insufficient documentation

## 2014-11-15 DIAGNOSIS — I509 Heart failure, unspecified: Secondary | ICD-10-CM | POA: Diagnosis not present

## 2014-11-15 DIAGNOSIS — Z8673 Personal history of transient ischemic attack (TIA), and cerebral infarction without residual deficits: Secondary | ICD-10-CM | POA: Insufficient documentation

## 2014-11-15 DIAGNOSIS — R42 Dizziness and giddiness: Secondary | ICD-10-CM | POA: Diagnosis not present

## 2014-11-15 DIAGNOSIS — R55 Syncope and collapse: Secondary | ICD-10-CM | POA: Insufficient documentation

## 2014-11-15 DIAGNOSIS — Z87891 Personal history of nicotine dependence: Secondary | ICD-10-CM | POA: Diagnosis not present

## 2014-11-15 DIAGNOSIS — Z7951 Long term (current) use of inhaled steroids: Secondary | ICD-10-CM | POA: Insufficient documentation

## 2014-11-15 DIAGNOSIS — J449 Chronic obstructive pulmonary disease, unspecified: Secondary | ICD-10-CM | POA: Diagnosis not present

## 2014-11-15 DIAGNOSIS — Z7982 Long term (current) use of aspirin: Secondary | ICD-10-CM | POA: Insufficient documentation

## 2014-11-15 DIAGNOSIS — G40909 Epilepsy, unspecified, not intractable, without status epilepticus: Secondary | ICD-10-CM | POA: Insufficient documentation

## 2014-11-15 DIAGNOSIS — Z8739 Personal history of other diseases of the musculoskeletal system and connective tissue: Secondary | ICD-10-CM | POA: Insufficient documentation

## 2014-11-15 DIAGNOSIS — R079 Chest pain, unspecified: Secondary | ICD-10-CM | POA: Diagnosis not present

## 2014-11-15 DIAGNOSIS — E119 Type 2 diabetes mellitus without complications: Secondary | ICD-10-CM | POA: Insufficient documentation

## 2014-11-15 DIAGNOSIS — I2510 Atherosclerotic heart disease of native coronary artery without angina pectoris: Secondary | ICD-10-CM | POA: Diagnosis not present

## 2014-11-15 DIAGNOSIS — N183 Chronic kidney disease, stage 3 (moderate): Secondary | ICD-10-CM | POA: Insufficient documentation

## 2014-11-15 DIAGNOSIS — R404 Transient alteration of awareness: Secondary | ICD-10-CM | POA: Diagnosis not present

## 2014-11-15 LAB — CBC WITH DIFFERENTIAL/PLATELET
Basophils Absolute: 0 10*3/uL (ref 0.0–0.1)
Basophils Relative: 0 % (ref 0–1)
Eosinophils Absolute: 0.1 10*3/uL (ref 0.0–0.7)
Eosinophils Relative: 2 % (ref 0–5)
HCT: 38.8 % (ref 36.0–46.0)
Hemoglobin: 13.2 g/dL (ref 12.0–15.0)
Lymphocytes Relative: 26 % (ref 12–46)
Lymphs Abs: 0.9 10*3/uL (ref 0.7–4.0)
MCH: 31.9 pg (ref 26.0–34.0)
MCHC: 34 g/dL (ref 30.0–36.0)
MCV: 93.7 fL (ref 78.0–100.0)
Monocytes Absolute: 0.2 10*3/uL (ref 0.1–1.0)
Monocytes Relative: 6 % (ref 3–12)
Neutro Abs: 2.3 10*3/uL (ref 1.7–7.7)
Neutrophils Relative %: 66 % (ref 43–77)
Platelets: 173 10*3/uL (ref 150–400)
RBC: 4.14 MIL/uL (ref 3.87–5.11)
RDW: 11.7 % (ref 11.5–15.5)
WBC: 3.5 10*3/uL — ABNORMAL LOW (ref 4.0–10.5)

## 2014-11-15 LAB — URINALYSIS, ROUTINE W REFLEX MICROSCOPIC
Bilirubin Urine: NEGATIVE
Glucose, UA: NEGATIVE mg/dL
Hgb urine dipstick: NEGATIVE
Ketones, ur: NEGATIVE mg/dL
Leukocytes, UA: NEGATIVE
Nitrite: NEGATIVE
Protein, ur: NEGATIVE mg/dL
Specific Gravity, Urine: 1.009 (ref 1.005–1.030)
Urobilinogen, UA: 0.2 mg/dL (ref 0.0–1.0)
pH: 5 (ref 5.0–8.0)

## 2014-11-15 LAB — BASIC METABOLIC PANEL
Anion gap: 8 (ref 5–15)
BUN: 16 mg/dL (ref 6–20)
CO2: 23 mmol/L (ref 22–32)
Calcium: 10 mg/dL (ref 8.9–10.3)
Chloride: 107 mmol/L (ref 101–111)
Creatinine, Ser: 1.26 mg/dL — ABNORMAL HIGH (ref 0.44–1.00)
GFR calc Af Amer: 51 mL/min — ABNORMAL LOW (ref >60)
GFR calc non Af Amer: 44 mL/min — ABNORMAL LOW (ref >60)
Glucose, Bld: 97 mg/dL (ref 65–99)
Potassium: 4.3 mmol/L (ref 3.5–5.1)
Sodium: 138 mmol/L (ref 135–145)

## 2014-11-15 LAB — TROPONIN I

## 2014-11-15 MED ORDER — ACETAMINOPHEN 325 MG PO TABS
325.0000 mg | ORAL_TABLET | Freq: Once | ORAL | Status: AC
  Administered 2014-11-15: 325 mg via ORAL
  Filled 2014-11-15: qty 1

## 2014-11-15 NOTE — ED Provider Notes (Addendum)
CSN: [id number]     Arrival date & time 11/15/14  1031 History   First MD Initiated Contact with Patient 11/15/14 1040     Chief Complaint  Patient presents with  . near syncopal      (Consider location/radiation/quality/duration/timing/severity/associated sxs/prior Treatment) The history is provided by the patient.   Patient is a 64 year old female presenting with presyncope.  She had an episode today where she stood up too quickly and felt unsteady.  She describes is as my "about to have a seizure" feeling.  She states it is hte same as the last 6 months.  Patient has had multipel admissions for this in the last couple of months for this and extensive in patient and out patient work up. . On discharge summary on 5/16 there had been input from PT, cardiology and neurology with no known etiology. EEG showed no seizure activity, thought to have a component of orthostatic hypotension.       Past Medical History  Diagnosis Date  . Stroke     a. L PCA CVA in 05/2014.  . Diabetes mellitus   . Hypertension   . CKD (chronic kidney disease), stage III   . Sinus bradycardia   . PVC's (premature ventricular contractions)   . Orthostatic hypotension   . Carotid stenosis     a. CT angio head 07/2014 - 50-60% stenoses of prox ICA bilaterally.  . PUD (peptic ulcer disease)   . Syncope   . Seizures   . CAD (coronary artery disease)     a. Pt reports in 2000 she had a stent to a coronary artery, details unclear, outside hospital.  . CHF (congestive heart failure)     a. Pt reports in 2000 she had CHF, details unclear, outside hospital.  . Gout    Past Surgical History  Procedure Laterality Date  . Cesarean section    . Dental surgery    . Stint    . Heart stent     Family History  Problem Relation Age of Onset  . Stroke Mother   . Stroke Father   . Prostate cancer Father   . Diabetes Mellitus II Daughter   . Heart attack Mother   . Colon cancer Father   . Multiple sclerosis Daughter    . Kidney disease Son   . Cancer Paternal Grandfather   . Lung cancer Mother    History  Substance Use Topics  . Smoking status: Former Smoker    Quit date: 03/30/2014  . Smokeless tobacco: Not on file  . Alcohol Use: No   OB History    No data available     Review of Systems  Constitutional: Negative for activity change and fatigue.  HENT: Negative for congestion and drooling.   Eyes: Negative for discharge.  Respiratory: Negative for cough and chest tightness.   Cardiovascular: Negative for chest pain.  Gastrointestinal: Negative for abdominal distention.  Genitourinary: Negative for dysuria and difficulty urinating.  Musculoskeletal: Negative for joint swelling.  Skin: Negative for rash.  Allergic/Immunologic: Negative for immunocompromised state.  Neurological: Positive for dizziness. Negative for seizures and speech difficulty.  Psychiatric/Behavioral: Negative for behavioral problems and agitation.      Allergies  Review of patient's allergies indicates no known allergies.  Home Medications   Prior to Admission medications   Medication Sig Start Date End Date Taking? Authorizing Provider  acetaminophen (TYLENOL) 325 MG tablet Take 650 mg by mouth every 6 (six) hours as needed.  Yes Historical Provider, MD  amLODipine (NORVASC) 10 MG tablet Take 10 mg by mouth daily.   Yes Historical Provider, MD  aspirin 81 MG tablet Take 81 mg by mouth daily.   Yes Historical Provider, MD  citalopram (CELEXA) 10 MG tablet Take 10 mg by mouth daily.   Yes Historical Provider, MD  dimenhyDRINATE (DRAMAMINE) 50 MG tablet Take 25 mg by mouth 3 (three) times daily.   Yes Historical Provider, MD  fluticasone (FLONASE) 50 MCG/ACT nasal spray Place 2 sprays into both nostrils daily as needed for allergies (allergies).    Yes Historical Provider, MD  levETIRAcetam (KEPPRA) 500 MG tablet Take 500 mg by mouth 2 (two) times daily. 11/03/14  Yes Historical Provider, MD  loratadine (CLARITIN)  10 MG tablet Take 10 mg by mouth daily.   Yes Historical Provider, MD  lovastatin (MEVACOR) 40 MG tablet Take 40 mg by mouth at bedtime.   Yes Historical Provider, MD  Meclizine HCl 25 MG CHEW Chew 1 tablet by mouth as needed (nausea; motion sickness).   Yes Historical Provider, MD  ondansetron (ZOFRAN) 4 MG tablet Take 4 mg by mouth every 4 (four) hours.   Yes Historical Provider, MD  topiramate (TOPAMAX) 100 MG tablet Take 1 tablet (100 mg total) by mouth 2 (two) times daily. 10/26/14  Yes Marcial Pacas, MD   BP 147/60 mmHg  Pulse 52  Temp(Src) 97.9 F (36.6 C) (Oral)  Resp 14  SpO2 98% Physical Exam  Constitutional: She is oriented to person, place, and time. She appears well-developed and well-nourished.  HENT:  Head: Normocephalic and atraumatic.  Eyes: Conjunctivae are normal. Right eye exhibits no discharge.  Can't see well out of right eye  Neck: Neck supple.  Cardiovascular: Normal rate, regular rhythm and normal heart sounds.   No murmur heard. Pulmonary/Chest: Effort normal and breath sounds normal. She has no wheezes. She has no rales.  Abdominal: Soft. She exhibits no distension. There is no tenderness.  Musculoskeletal: Normal range of motion. She exhibits no edema.  Neurological: She is oriented to person, place, and time. She displays normal reflexes. No cranial nerve deficit. Coordination normal.  Skin: Skin is warm and dry. No rash noted. She is not diaphoretic.  Psychiatric: She has a normal mood and affect. Her behavior is normal.  Nursing note and vitals reviewed.   ED Course  Procedures (including critical care time) Labs Review Labs Reviewed  CBC WITH DIFFERENTIAL/PLATELET  BASIC METABOLIC PANEL  TROPONIN I  URINALYSIS, ROUTINE W REFLEX MICROSCOPIC (NOT AT [facility])    Imaging Review No results found.   EKG Interpretation   Date/Time:  Monday November 15 2014 10:37:01 EDT Ventricular Rate:  55 PR Interval:    QRS Duration: 90 QT Interval:  455 QTC  Calculation: 435 R Axis:   59 Text Interpretation:  No signficant EKG changes from June, no acute  ischemia.  Confirmed by Gerald Leitz ([id number]) on 11/15/2014 10:43:22 AM      MDM   Final diagnoses:  None    Patietn is a 64 year old female presenting with presyncope. As outlined in HPI and dischrage summary from May, patient has had length in patinet and outpatient work up for these same events.  Will evaluate for infectious etiology.  Will get urine. Patietn feels back to baseline now.  If patient is able to ambulate appropriately, will consider discharge with instructions to follow upw with PCP.   12:43 PM Patient ambulated without assistance.  Feels baseline. PCP called,  agree with plan and follow up.   Malik Paar Julio Alm, MD 11/15/14 1244  Brigetta Beckstrom Julio Alm, MD 11/15/14 1247

## 2014-11-15 NOTE — ED Notes (Signed)
TO ED via [facility] from home with c/o near syncopal episode-- witnessed by EMS-- pt had far off gaze, lost balance, lasted approx 10 seconds. Pt and family state this is like a regular seizure for pt. Pt states she has partial seizures since she had a stroke. Pt c/o headache yesterday , relieved with tylenol 325mg . Pt says that she felt like she was going to have a seizure this morning. No dizziness at present--

## 2014-11-15 NOTE — Discharge Instructions (Signed)
Near-Syncope Near-syncope (commonly known as near fainting) is sudden weakness, dizziness, or feeling like you might pass out. During an episode of near-syncope, you may also develop pale skin, have tunnel vision, or feel sick to your stomach (nauseous). Near-syncope may occur when getting up after sitting or while standing for a long time. It is caused by a sudden decrease in blood flow to the brain. This decrease can result from various causes or triggers, most of which are not serious. However, because near-syncope can sometimes be a sign of something serious, a medical evaluation is required. The specific cause is often not determined. HOME CARE INSTRUCTIONS  Monitor your condition for any changes. The following actions may help to alleviate any discomfort you are experiencing:  Have someone stay with you until you feel stable.  Lie down right away and prop your feet up if you start feeling like you might faint. Breathe deeply and steadily. Wait until all the symptoms have passed. Most of these episodes last only a few minutes. You may feel tired for several hours.   Drink enough fluids to keep your urine clear or pale yellow.   If you are taking blood pressure or heart medicine, get up slowly when seated or lying down. Take several minutes to sit and then stand. This can reduce dizziness.  Follow up with your health care provider as directed. SEEK IMMEDIATE MEDICAL CARE IF:   You have a severe headache.   You have unusual pain in the chest, abdomen, or back.   You are bleeding from the mouth or rectum, or you have black or tarry stool.   You have an irregular or very fast heartbeat.   You have repeated fainting or have seizure-like jerking during an episode.   You faint when sitting or lying down.   You have confusion.   You have difficulty walking.   You have severe weakness.   You have vision problems.  MAKE SURE YOU:   Understand these instructions.  Will  watch your condition.  Will get help right away if you are not doing well or get worse. Document Released: 04/16/2005 Document Revised: 04/21/2013 Document Reviewed: 09/19/2012 Exit[employer] Patient Information 2015 [employer]. This information is not intended to replace advice given to you by your health care provider. Make sure you discuss any questions you have with your health care provider.  

## 2014-11-15 NOTE — ED Notes (Signed)
Lab called, urine specimen spilled in bag, need to re-collect.

## 2014-12-06 ENCOUNTER — Other Ambulatory Visit: Payer: Self-pay

## 2014-12-06 DIAGNOSIS — F418 Other specified anxiety disorders: Secondary | ICD-10-CM

## 2014-12-06 MED ORDER — CITALOPRAM HYDROBROMIDE 10 MG PO TABS: 10.0000 mg | ORAL_TABLET | Freq: Every day | ORAL | Status: DC

## 2014-12-06 NOTE — Telephone Encounter (Signed)
Received this request from [facility]  Refill request was sent to Dr. Bobetta Lime for approval and submission.

## 2014-12-14 ENCOUNTER — Telehealth: Payer: Self-pay | Admitting: Urology

## 2014-12-14 NOTE — Telephone Encounter (Signed)
Received a fax from [facility] Therapy indicating that this patient had an appointment on 11/08/2014, but did not show for the appointment.  They have attempted to contact her with no success.

## 2014-12-17 ENCOUNTER — Other Ambulatory Visit: Payer: Self-pay

## 2014-12-17 DIAGNOSIS — I10 Essential (primary) hypertension: Secondary | ICD-10-CM

## 2014-12-17 NOTE — Telephone Encounter (Signed)
Got a fax from [facility] requesting a refill of her Amlodipine 10mg .  Refill request was sent to Dr. Bobetta Lime for approval and submission.

## 2014-12-18 MED ORDER — AMLODIPINE BESYLATE 10 MG PO TABS: 10.0000 mg | ORAL_TABLET | Freq: Every day | ORAL | Status: DC

## 2015-01-24 ENCOUNTER — Telehealth: Payer: Self-pay | Admitting: *Deleted

## 2015-01-24 NOTE — Telephone Encounter (Signed)
Patients appt has been rescheduled

## 2015-01-24 NOTE — Telephone Encounter (Signed)
Spoke to [place] - she is going to call me back to get her appt rescheduled after she talks with her transportation.

## 2015-01-25 ENCOUNTER — Ambulatory Visit: Payer: Medicare Other | Admitting: Neurology

## 2015-01-31 ENCOUNTER — Ambulatory Visit (INDEPENDENT_AMBULATORY_CARE_PROVIDER_SITE_OTHER): Payer: Medicare Other | Admitting: Neurology

## 2015-01-31 ENCOUNTER — Encounter: Payer: Self-pay | Admitting: Neurology

## 2015-01-31 VITALS — BP 155/77 | HR 65 | Ht 61.0 in | Wt 167.0 lb

## 2015-01-31 DIAGNOSIS — I63532 Cerebral infarction due to unspecified occlusion or stenosis of left posterior cerebral artery: Secondary | ICD-10-CM | POA: Diagnosis not present

## 2015-01-31 DIAGNOSIS — I639 Cerebral infarction, unspecified: Secondary | ICD-10-CM

## 2015-01-31 DIAGNOSIS — R569 Unspecified convulsions: Secondary | ICD-10-CM

## 2015-01-31 MED ORDER — LEVETIRACETAM ER 750 MG PO TB24: ORAL_TABLET | ORAL | Status: DC

## 2015-01-31 NOTE — Progress Notes (Signed)
Chief Complaint  Patient presents with  . Seizures    She was not able to tolerate Topamax and restarted Keppra 500mg  twice daily.  She has had two seizures since being back on Keppra.  Denies missing any doses of medication.  She went to the ED for treatment with both events.  . Cerebrovascular Accident    She continues to take aspirin 81mg  daily.  Says her eyes have felt dry since having her stroke and this symptom is becoming more bothersome.   Chief Complaint  Patient presents with  . Seizures    She was not able to tolerate Topamax and restarted Keppra 500mg  twice daily.  She has had two seizures since being back on Keppra.  Denies missing any doses of medication.  She went to the ED for treatment with both events.  . Cerebrovascular Accident    She continues to take aspirin 81mg  daily.  Says her eyes have felt dry since having her stroke and this symptom is becoming more bothersome.      PATIENT: Leslie Houston DOB: May 08, 1950  Chief Complaint  Patient presents with  . Seizures    She was not able to tolerate Topamax and restarted Keppra 500mg  twice daily.  She has had two seizures since being back on Keppra.  Denies missing any doses of medication.  She went to the ED for treatment with both events.  . Cerebrovascular Accident    She continues to take aspirin 81mg  daily.  Says her eyes have felt dry since having her stroke and this symptom is becoming more bothersome.    HISTORICAL  Leslie Houston is a 64 yo RH female with her care giver [name], her PCP is 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  She presented to hospital multiple times for similar presentation, I have reviewed MRI of brain in May 2016, large left occipital stroke, mild small vessel disease at supratentorium.   Laboratory showed normal BMP, with exception of creatinine 1.3, mild low WBC 3.8  Echocardiogram April 2016, normal ejection fraction 55-60%, no significant structural abnormality,  She is now wearing a cardiac monitoring,   UPDATE June 28th 2016: She is with her care giver [name], She only had one episode of seizure like event, sudden onset of confusion, instead of daily similar episode since she started taking Keppra 500 mg twice a day, she has frequent almost daily headaches, We have reviewed EEG result, that was normal in June 2016 I have reviewed MRI of brain scan with patient, chronic left occipital stroke, supratentorium small vessel disease, no acute lesions  UPDATE Jan 31 2015: She could not tolerate Topamax, made her feel different, had diarrhea, she is back on Keppr 500mg  bid, she has two spells since June 2016,  She was taken to the hospital in June 14 and July 18 for seizure-like event, she is a little bit confused  about her medications, she is tolerating Keppra much better  ALLERGIES: No Known Allergies   HOME MEDICATIONS: Current Outpatient Prescriptions  Medication Sig Dispense Refill  . acetaminophen (TYLENOL) 325 MG tablet Take 650 mg by mouth every 6 (six) hours as needed.    [name] amLODipine (NORVASC) 10 MG tablet Take 10 mg by mouth daily.    [name] aspirin 81 MG tablet Take 81 mg by mouth daily.    . citalopram (CELEXA) 10 MG tablet Take 10 mg by mouth daily.    . fluticasone (FLONASE) 50 MCG/ACT nasal spray Place 2 sprays into both nostrils daily as needed for  allergies (allergies).     . loratadine (CLARITIN) 10 MG tablet Take 10 mg by mouth daily.    [name] lovastatin (MEVACOR) 40 MG tablet Take 40 mg by mouth at bedtime.    . ondansetron (ZOFRAN) 4 MG tablet Take 4 mg by mouth every 8 (eight) hours as needed for nausea or vomiting (nausea).       PAST MEDICAL HISTORY: Past Medical History  Diagnosis Date  . Stroke [facility])     a. L PCA CVA in 05/2014.  . Diabetes mellitus ([place])   . Hypertension   . CKD (chronic kidney disease), stage III   . Sinus bradycardia   . PVC's (premature ventricular contractions)   . Orthostatic hypotension   . Carotid stenosis     a. CT angio head 07/2014 - 50-60% stenoses of prox ICA bilaterally.  . PUD (peptic ulcer disease)   . Syncope   . Seizures ([place])   . CAD (coronary artery disease)     a. Pt reports in 2000 she had a stent to a coronary artery, details unclear, outside hospital.  . CHF (congestive heart failure) ([place])     a. Pt reports in 2000 she had CHF, details unclear, outside hospital.  . Gout     PAST SURGICAL HISTORY: Past Surgical History  Procedure Laterality Date  . Cesarean section    . Dental surgery    . Heart stent      FAMILY HISTORY: Family History  Problem Relation Age of Onset  . Stroke Mother   . Stroke Father   . Prostate cancer Father   . Diabetes Mellitus II Daughter   . Heart attack Mother   . Colon cancer Father   . Multiple sclerosis Daughter   . Kidney disease Son   . Cancer Paternal Grandfather   . Lung cancer Mother     SOCIAL HISTORY:  Social History   Social History  . Marital Status: Single    Spouse Name: N/A  . Number of Children: 2  . Years of Education: 12   Occupational History  . Disabled    Social History Main Topics  . Smoking status: Former Smoker    Quit date: 03/30/2014  . Smokeless tobacco: Not on file  . Alcohol Use: No  . Drug Use: No  . Sexual Activity: Not on file   Other Topics Concern  . Not on file   Social History  Narrative   Lives at home with her son.   Right-handed.   1 cup coffee per day.     PHYSICAL EXAM   Filed Vitals:   01/31/15 1619  BP: 155/77  Pulse: 65  Height: 5\' 1"  (1.549 m)  Weight: 167 lb (75.751 kg)    Not recorded      Body mass index is 31.57 kg/(m^2).  PHYSICAL EXAMNIATION:  Gen: NAD, conversant,  well nourised, obese, well groomed                     Cardiovascular: Regular rate rhythm, no peripheral edema, warm, nontender. Eyes: Conjunctivae clear without exudates or hemorrhage Neck: Supple, no carotid bruise. Pulmonary: Clear to auscultation bilaterally   NEUROLOGICAL EXAM:  MENTAL STATUS: Speech:    Speech is normal; fluent and spontaneous with normal comprehension.  Cognition:    The patient is oriented to person, place, and time;       CRANIAL NERVES: CN II: Visual fields are full to confrontation. Pupils are equal round reactive to light CN III, IV, VI: extraocular movement are normal. No ptosis. CN V: Facial sensation is intact to pinprick in all 3 divisions bilaterally. Corneal responses are intact.  CN VII: Face is symmetric with normal eye closure and smile. CN VIII: Hearing is normal to rubbing fingers CN IX, X: Palate elevates symmetrically. Phonation is normal. CN XI: Head turning and shoulder shrug are intact CN XII: Tongue is midline with normal movements and no atrophy.  MOTOR: There is no pronator drift of out-stretched arms. Muscle bulk and tone are normal. Muscle strength is normal.  REFLEXES: Reflexes are 2+ and symmetric at the biceps, triceps, knees, and ankles. Plantar responses are flexor.  SENSORY: Light touch, pinprick, position sense, and vibration sense are intact in fingers and toes.  COORDINATION: Rapid alternating movements and fine finger movements are intact. There is no dysmetria on finger-to-nose and heel-knee-shin. There are no abnormal or extraneous movements.   GAIT/STANCE: Need to push up to get up from  seated position, wide based, mildly unsteady   DIAGNOSTIC DATA (LABS, IMAGING, TESTING) - I reviewed patient records, labs, notes, testing and imaging myself where available.  Lab Results  Component Value Date   WBC 3.5* 11/15/2014   HGB 13.2 11/15/2014   HCT 38.8 11/15/2014   MCV 93.7 11/15/2014   PLT 173 11/15/2014      Component Value Date/Time   NA 138 11/15/2014 1123   NA 142 08/19/2014 0822   K 4.3 11/15/2014 1123   K 4.1 08/19/2014 0822   CL 107 11/15/2014 1123   CL 109 08/19/2014 0822   CO2 23 11/15/2014 1123   CO2 26 08/19/2014 0822   GLUCOSE 97 11/15/2014 1123   GLUCOSE 110* 08/19/2014 0822   BUN 16 11/15/2014 1123   BUN 16 08/19/2014 0822   CREATININE 1.26* 11/15/2014 1123   CREATININE 1.39* 08/19/2014 0822   CALCIUM 10.0 11/15/2014 1123   CALCIUM 10.2 08/19/2014 0822   PROT 6.5 10/12/2014 0945   PROT 7.3 07/24/2014 0915   ALBUMIN 3.5 10/12/2014 0945   ALBUMIN 4.1 07/24/2014 0915   AST 15 10/12/2014 0945   AST 31 07/24/2014 0915   ALT 12* 10/12/2014 0945   ALT 29 07/24/2014 0915   ALKPHOS 52 10/12/2014 0945   ALKPHOS 53 07/24/2014 0915   BILITOT 0.4 10/12/2014 0945   BILITOT 0.9 07/24/2014 0915   GFRNONAA 44* 11/15/2014 1123   GFRNONAA 40* 08/19/2014 0822   GFRNONAA 27* 04/21/2014 1442   GFRAA 51* 11/15/2014 1123   GFRAA 46* 08/19/2014 0822   GFRAA 33* 04/21/2014 1442   No results found for: CHOL, HDL, LDLCALC, LDLDIRECT, TRIG, CHOLHDL Lab Results  Component Value Date   HGBA1C 5.1 08/24/2014   No results found for: [id number] Lab Results  Component Value Date   TSH 1.351 08/24/2014      ASSESSMENT AND PLAN  Leslie Houston is a  64 y.o. female with history of epilepsy, left occipital stroke, now presenting with frequent spells, consistent with complex partial seizures.  complex partial seizure,   She still has occasional recurrent episode while taking Keppra 500 mg twice a day, I will change her to Keppra XR 750 mg 2 tablets every night     document all the event   return to clinic in 6 months  Migraine headaches:   When necessary NSAIDs  Left occipital stroke:  Daily aspirin  Marcial Pacas, M.D. Ph.D.  [facility] [address], [place], [place] [address] Ph: [phone] Fax: [phone]

## 2015-02-16 ENCOUNTER — Other Ambulatory Visit: Payer: Self-pay

## 2015-02-16 DIAGNOSIS — I10 Essential (primary) hypertension: Secondary | ICD-10-CM

## 2015-02-16 NOTE — Telephone Encounter (Signed)
Got a fax from [facility] requesting a refill of this patient's amlodipine 10mg  #30.  Refill request was sent to Dr. Bobetta Lime for approval and submission.

## 2015-02-18 MED ORDER — AMLODIPINE BESYLATE 10 MG PO TABS: 10.0000 mg | ORAL_TABLET | Freq: Every day | ORAL | Status: DC

## 2015-02-21 ENCOUNTER — Other Ambulatory Visit: Payer: Self-pay

## 2015-02-21 NOTE — Telephone Encounter (Signed)
Refill request was sent to Dr. Ashany Sundaram for approval and submission.  

## 2015-02-22 ENCOUNTER — Other Ambulatory Visit: Payer: Self-pay

## 2015-02-22 MED ORDER — LOVASTATIN 40 MG PO TABS: 40.0000 mg | ORAL_TABLET | Freq: Every day | ORAL | Status: DC

## 2015-02-22 NOTE — Telephone Encounter (Signed)
Got a fax from [facility] requesting a refill of this patient's Lovastatin 40mg , but it has already been sent in by [name] today.

## 2015-05-06 ENCOUNTER — Other Ambulatory Visit: Payer: Self-pay

## 2015-05-06 MED ORDER — LORATADINE 10 MG PO TABS: 10.0000 mg | ORAL_TABLET | Freq: Every day | ORAL | Status: DC

## 2015-05-20 ENCOUNTER — Ambulatory Visit: Payer: Self-pay | Admitting: Urology

## 2015-05-31 ENCOUNTER — Encounter: Payer: Self-pay | Admitting: Urology

## 2015-05-31 ENCOUNTER — Ambulatory Visit: Payer: Self-pay | Admitting: Urology

## 2015-06-06 ENCOUNTER — Other Ambulatory Visit: Payer: Self-pay

## 2015-06-06 DIAGNOSIS — F418 Other specified anxiety disorders: Secondary | ICD-10-CM

## 2015-06-08 MED ORDER — CITALOPRAM HYDROBROMIDE 10 MG PO TABS: 10.0000 mg | ORAL_TABLET | Freq: Every day | ORAL | Status: DC

## 2015-06-11 IMAGING — CR DG CHEST 1V PORT
1 series · 1 of 1 positions shown · non-contrast
Comparison: None.

CLINICAL DATA: Nausea and headache

EXAM:
PORTABLE CHEST - 1 VIEW

[ap]
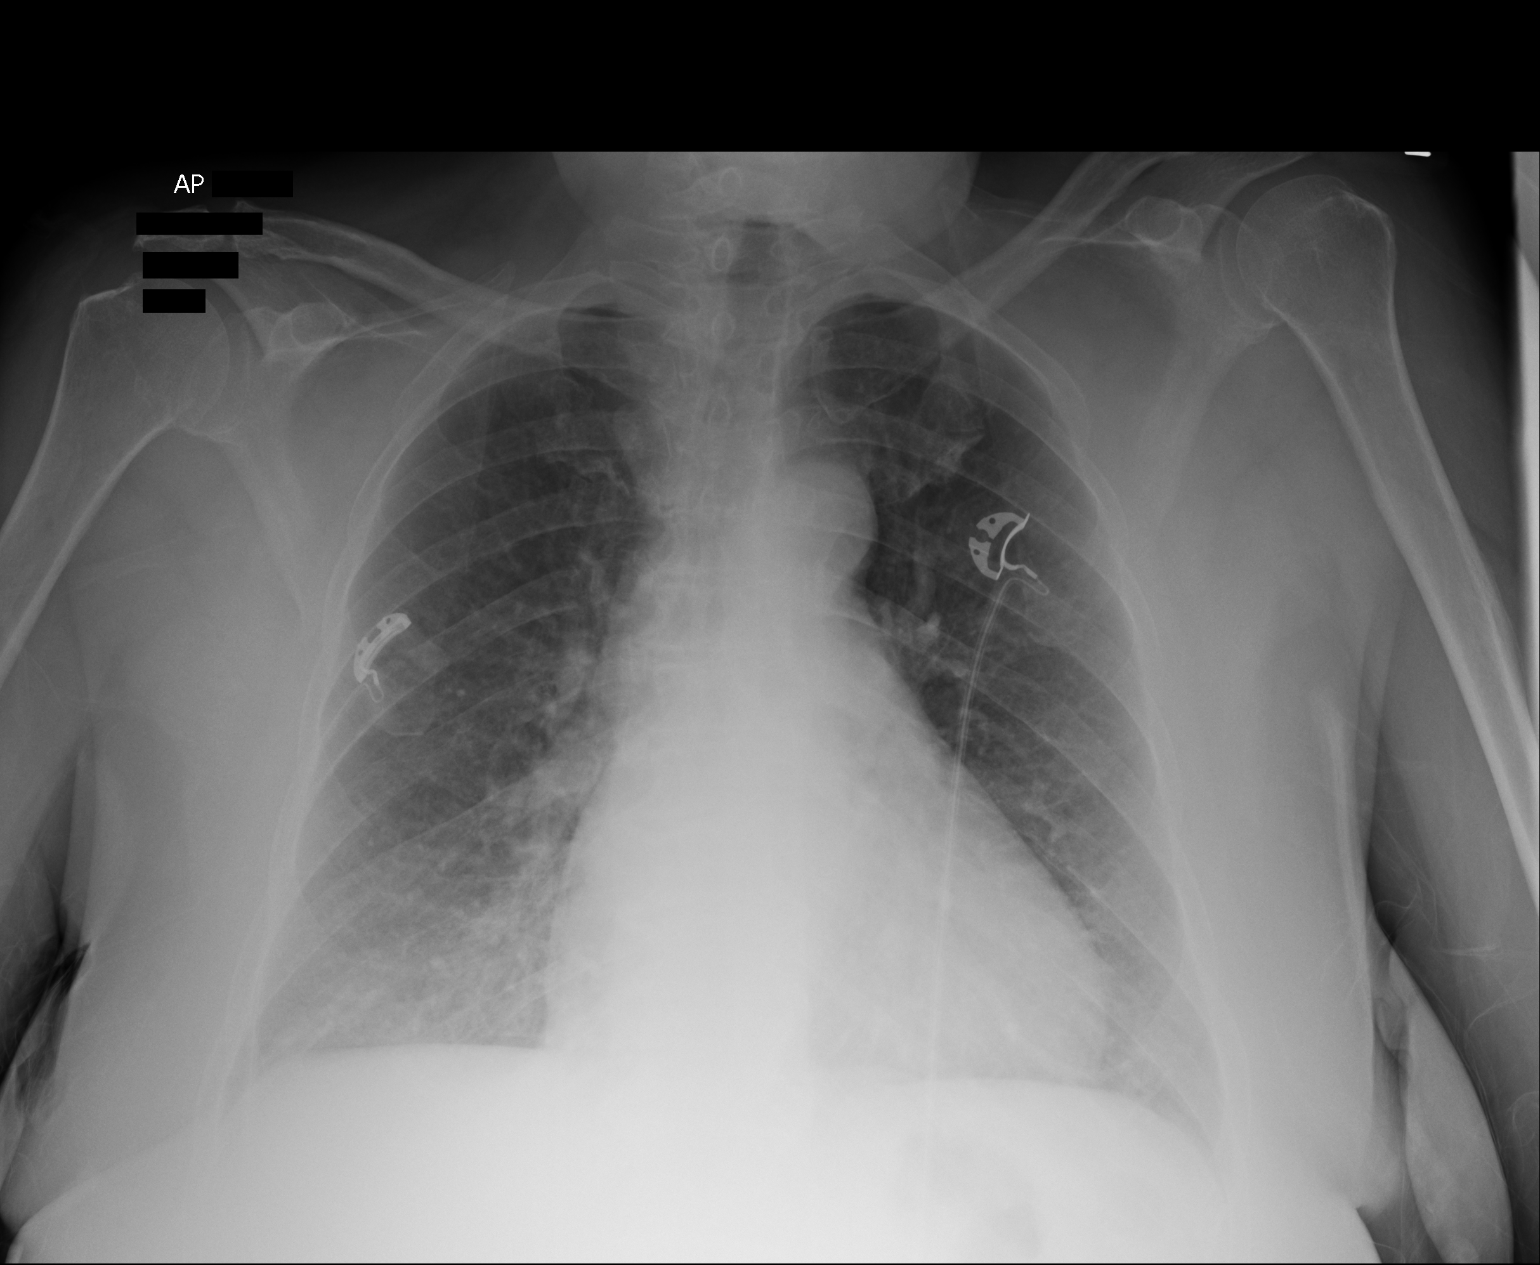

[1 of 1 positions shown; findings below may reference images not displayed]

FINDINGS: There is underlying emphysematous change. There is no edema or
consolidation. Heart is mildly enlarged with pulmonary vascularity
within normal limits. No adenopathy. No bone lesions.
IMPRESSION: There is a degree of underlying emphysematous change. No edema or
consolidation. Heart prominent.

## 2015-06-11 IMAGING — CT CT HEAD WITHOUT CONTRAST
1 series · 16 of 28 positions shown, 20 images · non-contrast
Comparison: 06/30/2014

CLINICAL DATA: Headache, reported multiple stroke events over 5
months

EXAM:
CT HEAD WITHOUT CONTRAST
TECHNIQUE: Contiguous axial images were obtained from the base of the skull
through the vertex without intravenous contrast.

[Series 2: soft tissue · axial · 0.42mm/px · z∈[+344,+469]mm · 16 of 28 slices shown, 20 images]
[im 2/28  brain]
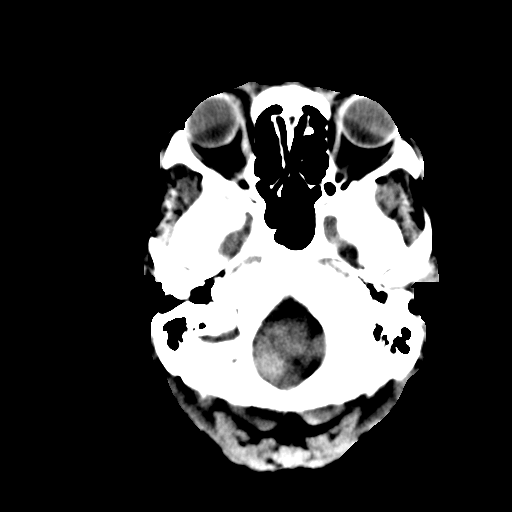
[im 2/28  bone]
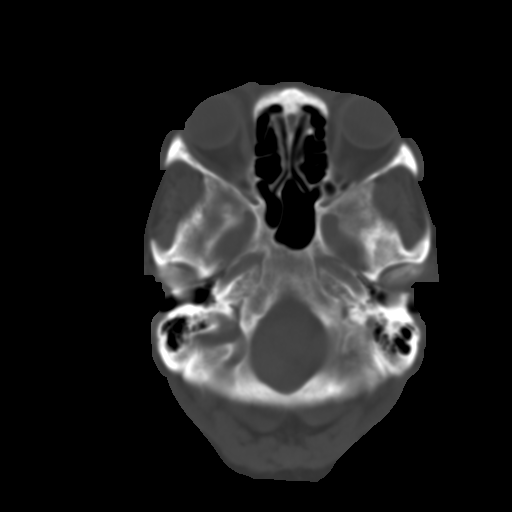
[im 4/28  brain]
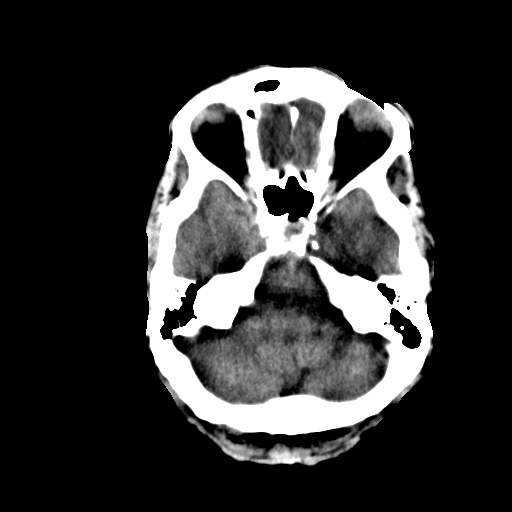
[im 6/28  brain]
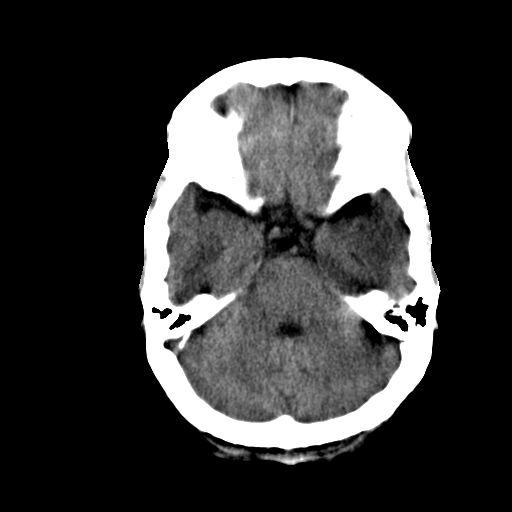
[im 7/28  brain]
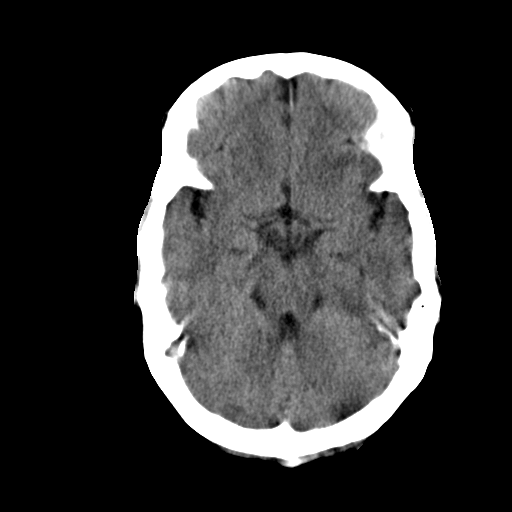
[im 9/28  brain]
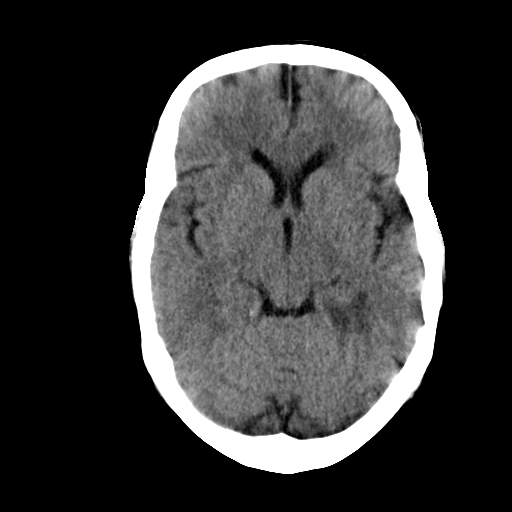
[im 9/28  bone]
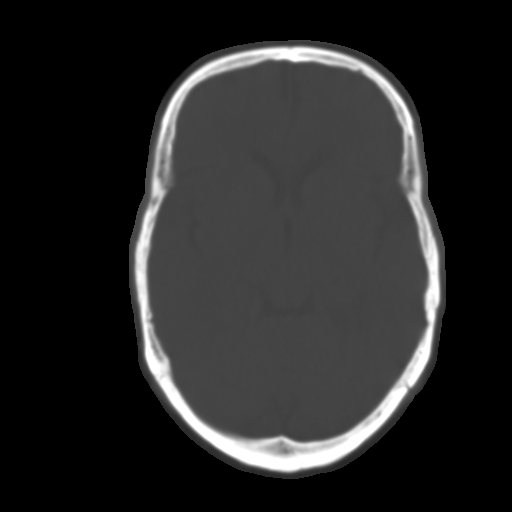
[im 10/28  brain]
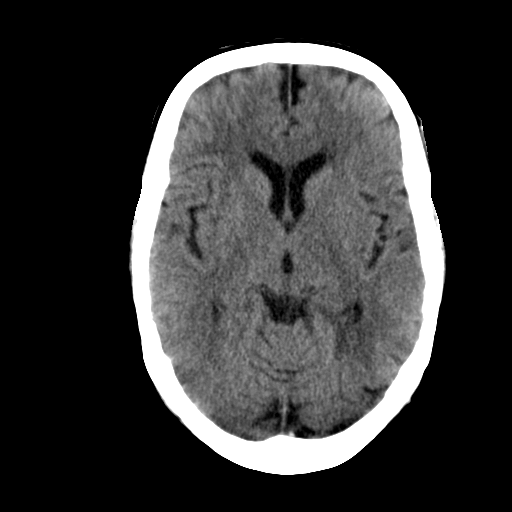
[im 12/28  brain]
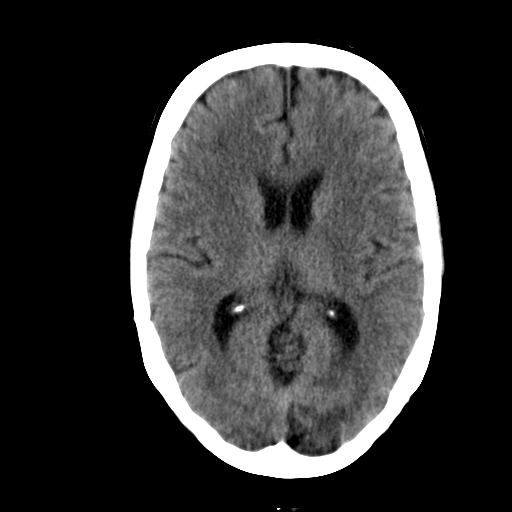
[im 14/28  brain]
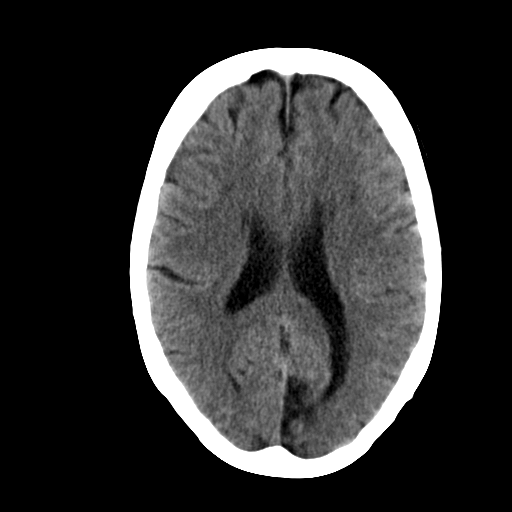
[im 15/28  brain]
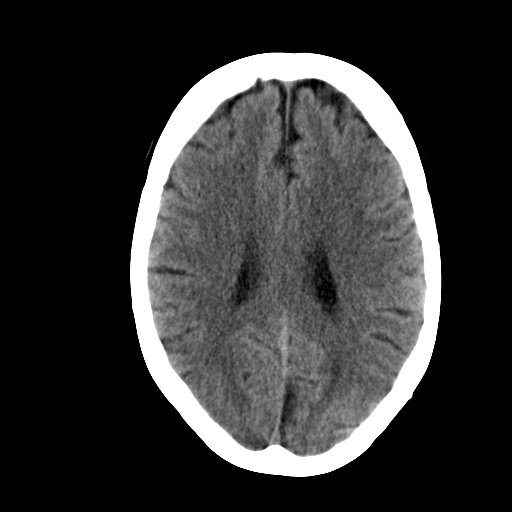
[im 15/28  bone]
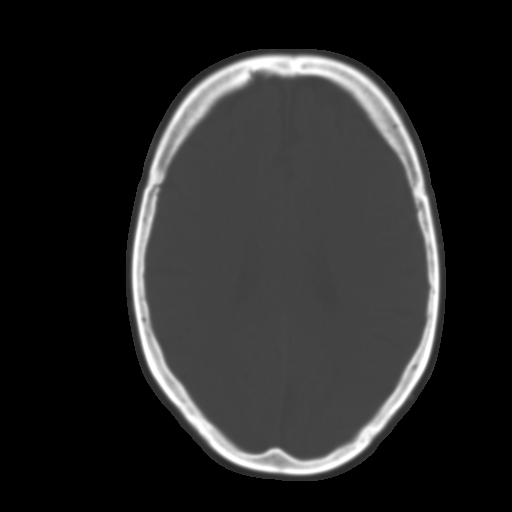
[im 17/28  brain]
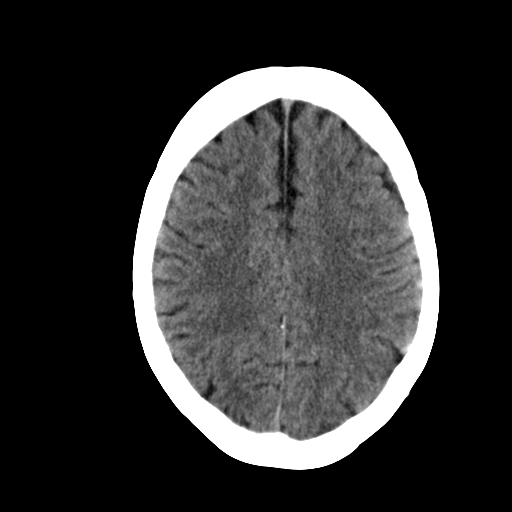
[im 19/28  brain]
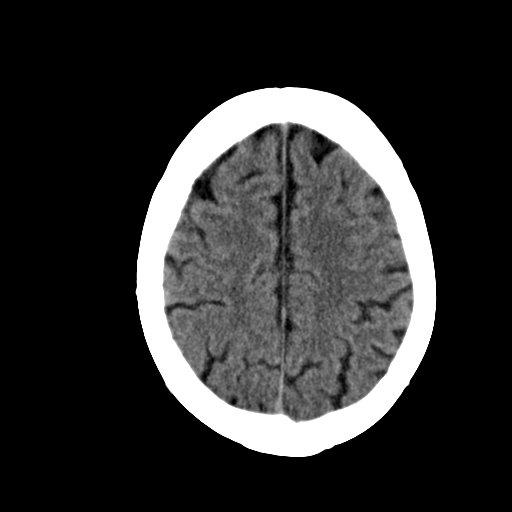
[im 20/28  brain]
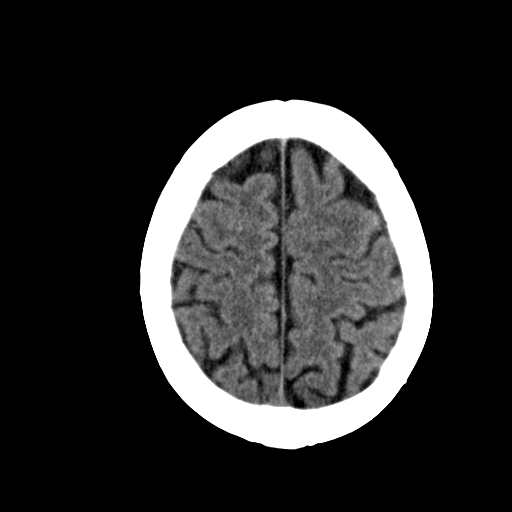
[im 22/28  brain]
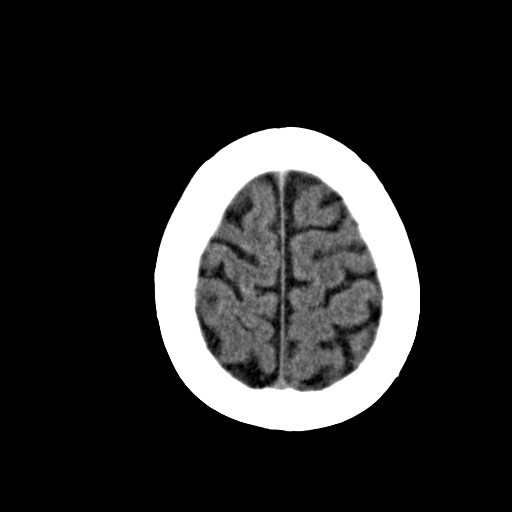
[im 22/28  bone]
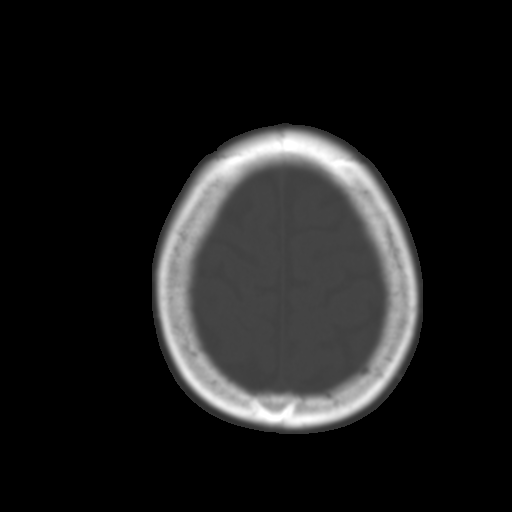
[im 23/28  brain]
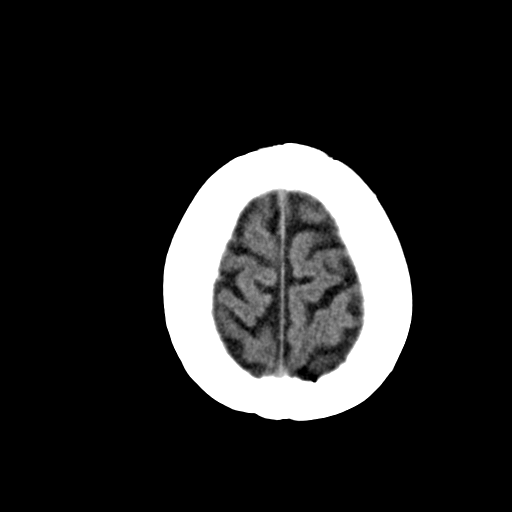
[im 25/28  brain]
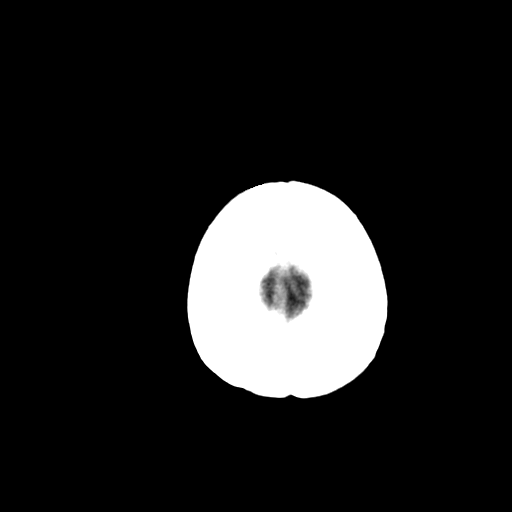
[im 27/28  brain]
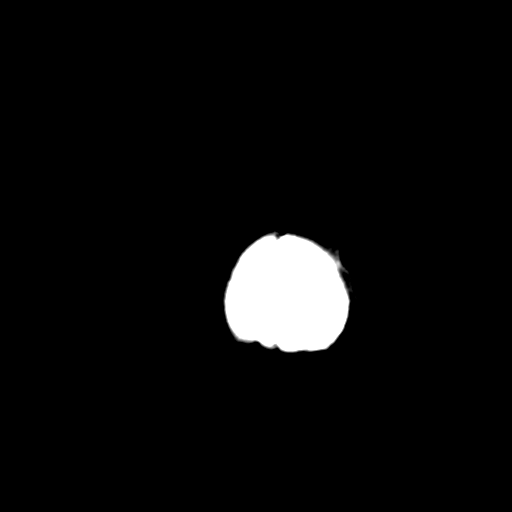

[16 of 28 positions shown; findings below may reference images not displayed]

FINDINGS: Apparent continued evolution of left occipital encephalomalacia
since the prior exam. No acute hemorrhage, infarct, or mass lesion
is identified. No midline shift. Minimal cortical volume loss with
proportional ventricular prominence reidentified. Orbits and
paranasal sinuses are intact.
IMPRESSION: No acute intracranial finding. Continued evolution of left occipital
previously seen infarct.

## 2015-07-12 IMAGING — CR DG CHEST 2V
2 series · 2 of 2 positions shown · non-contrast
Comparison: Chest radiograph performed 08/18/2014

CLINICAL DATA: Acute onset of syncope.  Initial encounter.

EXAM:
CHEST  2 VIEW

[chest lat]
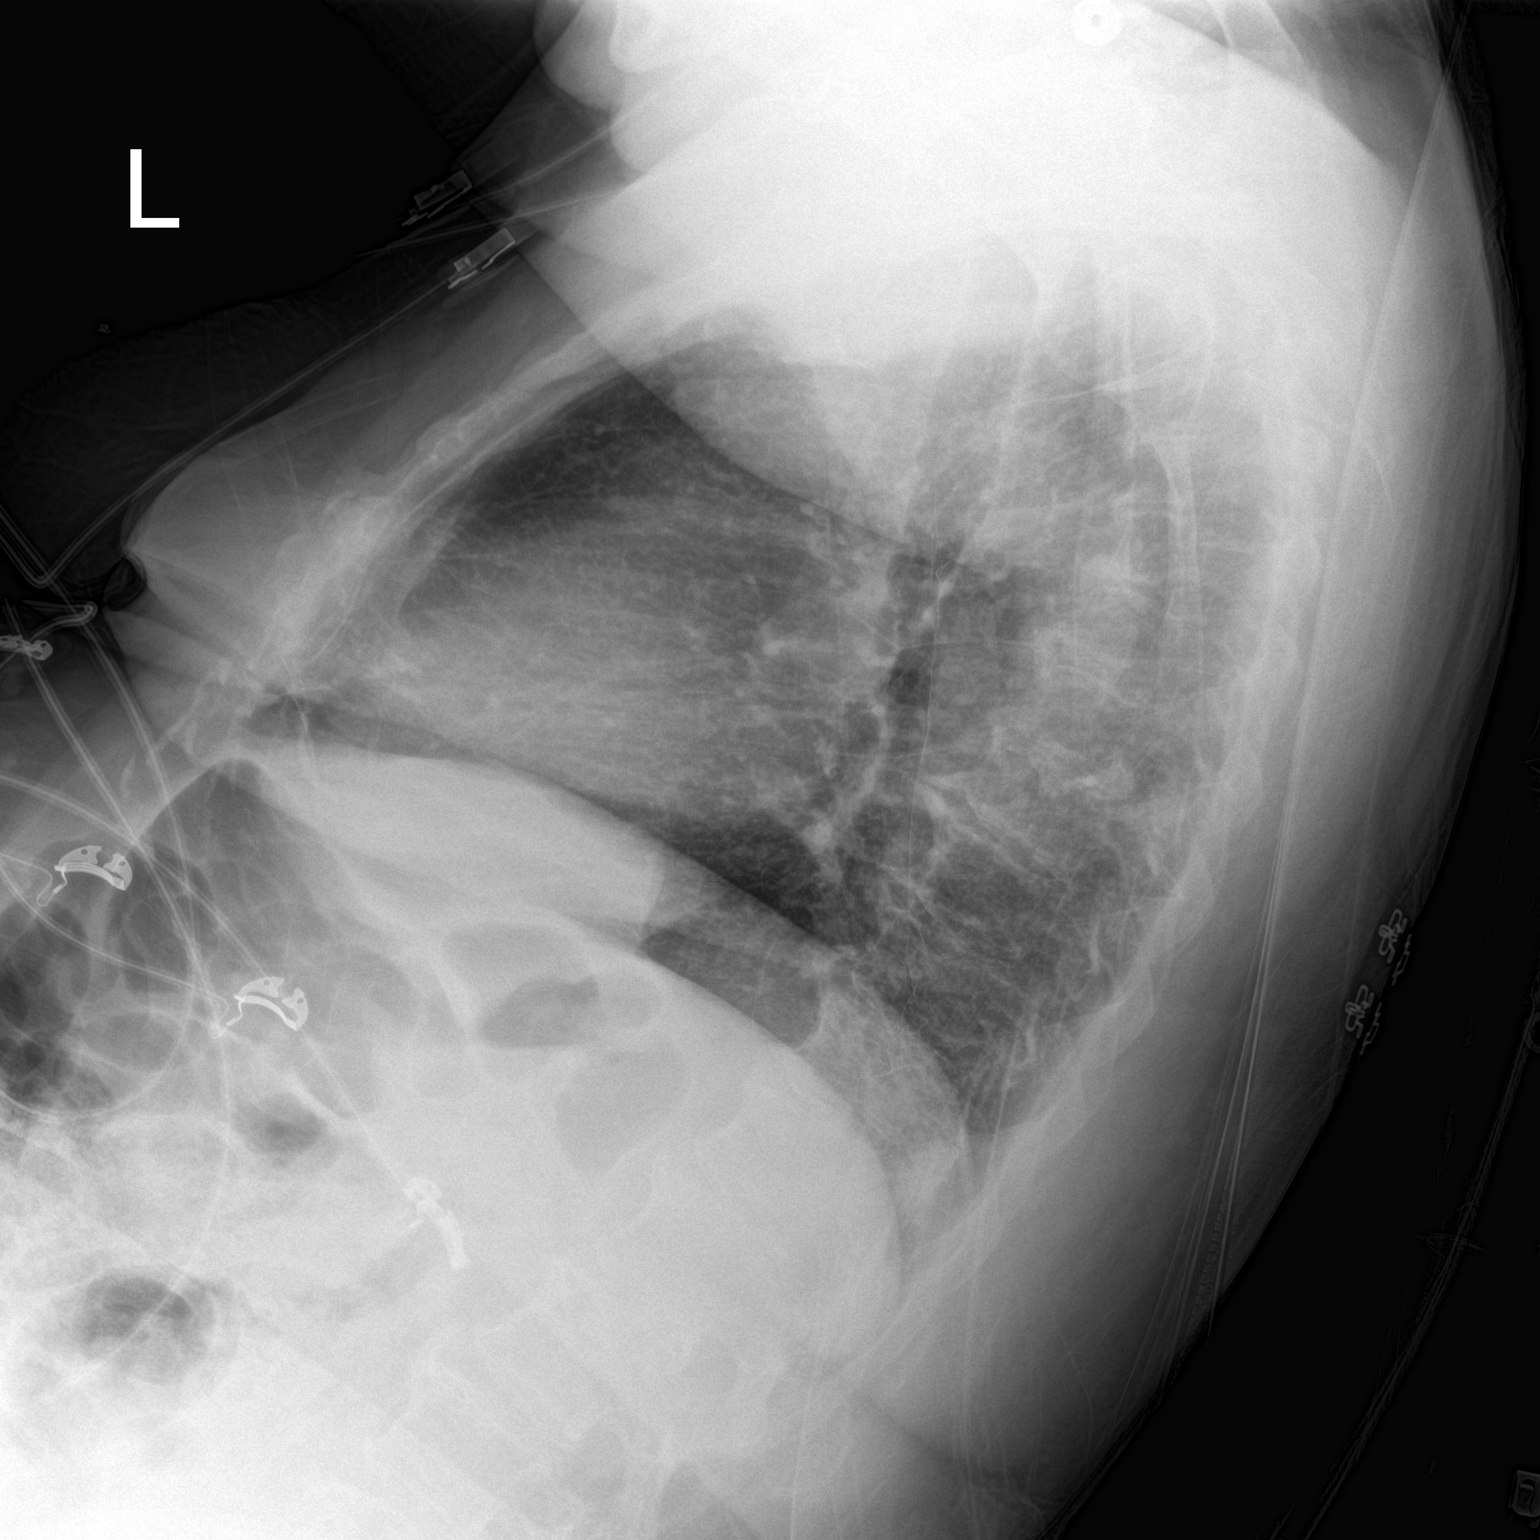

[chest ap]
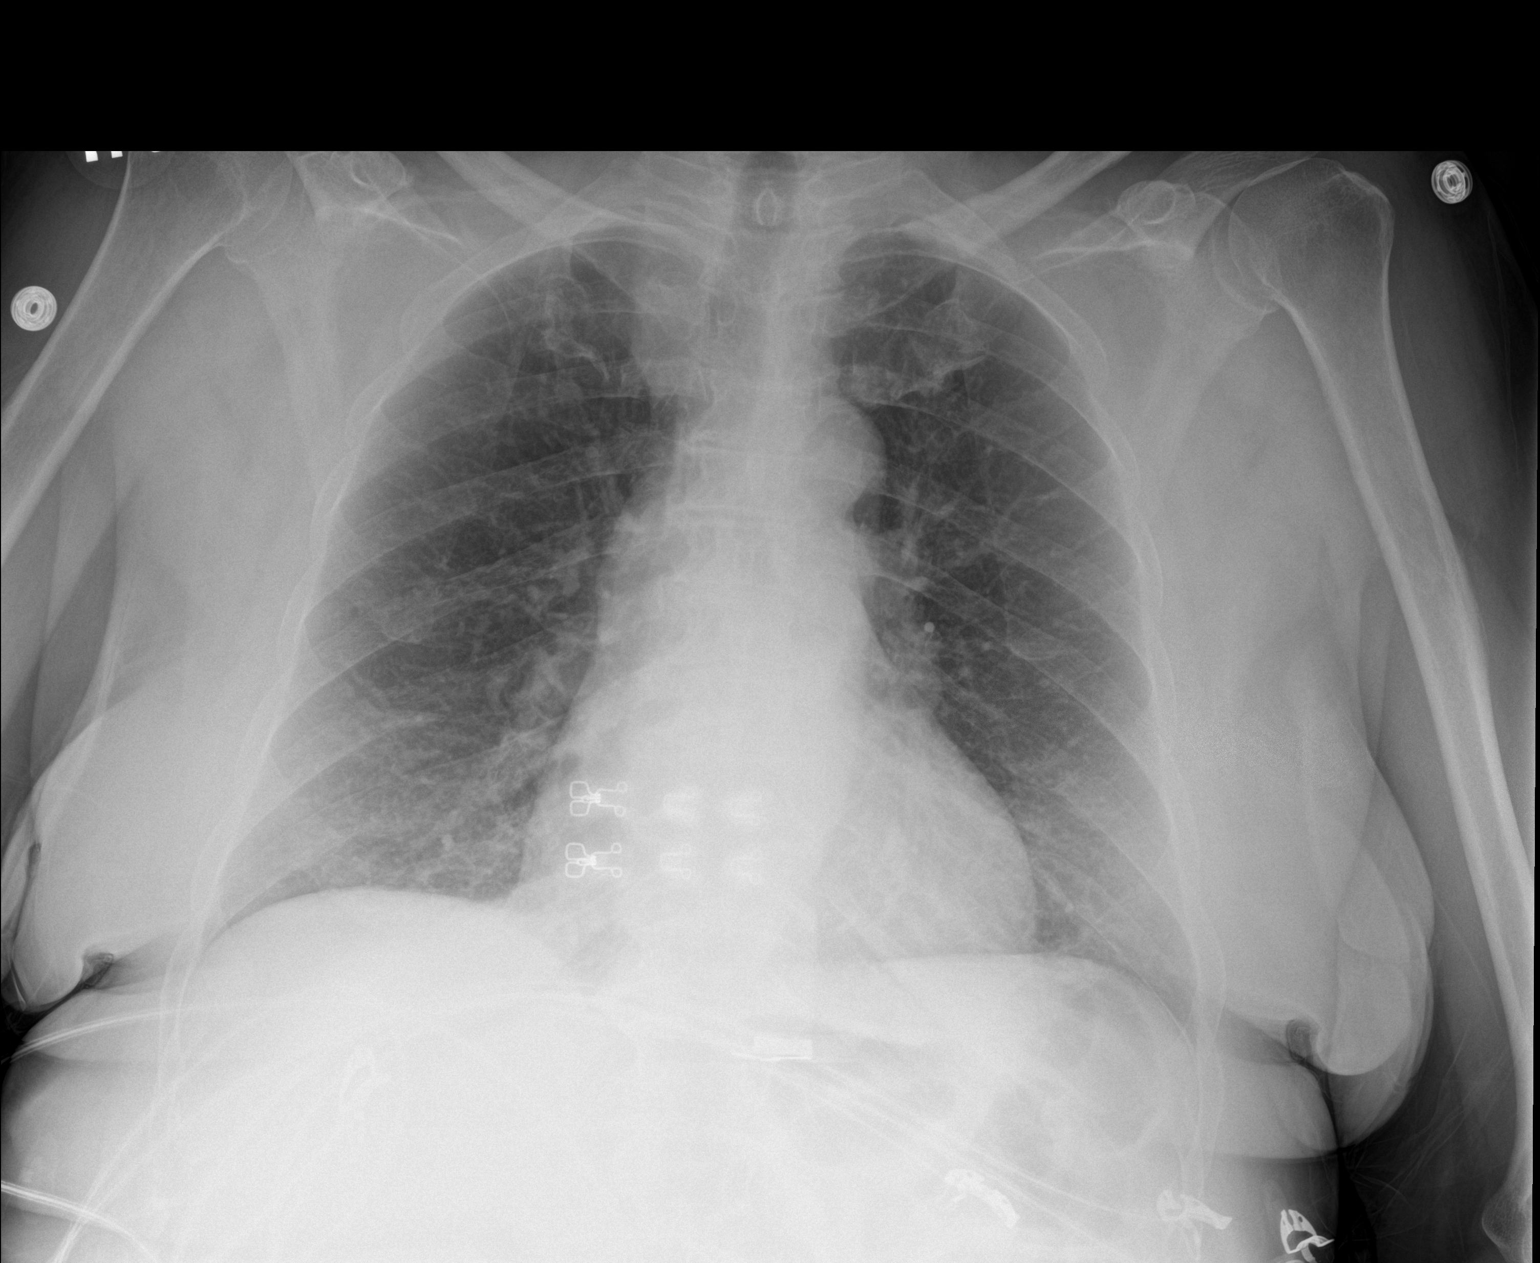

[2 of 2 positions shown; findings below may reference images not displayed]

FINDINGS: The lungs are well-aerated. Mild bibasilar atelectasis is noted.
There is no evidence of pleural effusion or pneumothorax.

The heart is normal in size; the mediastinal contour is within
normal limits. No acute osseous abnormalities are seen.
IMPRESSION: Mild bibasilar atelectasis is noted.  Lungs otherwise clear.

## 2015-07-19 ENCOUNTER — Other Ambulatory Visit: Payer: Self-pay

## 2015-07-20 MED ORDER — LORATADINE 10 MG PO TABS: 10.0000 mg | ORAL_TABLET | Freq: Every day | ORAL | Status: DC

## 2015-07-21 ENCOUNTER — Other Ambulatory Visit: Payer: Self-pay | Admitting: Family Medicine

## 2015-08-01 ENCOUNTER — Ambulatory Visit: Payer: Medicare Other | Admitting: Nurse Practitioner

## 2015-08-04 ENCOUNTER — Other Ambulatory Visit: Payer: Self-pay

## 2015-08-04 DIAGNOSIS — I10 Essential (primary) hypertension: Secondary | ICD-10-CM

## 2015-08-04 MED ORDER — AMLODIPINE BESYLATE 10 MG PO TABS: 10.0000 mg | ORAL_TABLET | Freq: Every day | ORAL | Status: DC

## 2015-08-04 NOTE — Telephone Encounter (Signed)
Got a fax from [facility] requesting a refill of this patient's Amlodipine 10mg .  Refill request was sent to Dr. Steele Sizer for approval and submission.

## 2015-08-15 ENCOUNTER — Ambulatory Visit: Payer: Self-pay | Admitting: Family Medicine

## 2015-08-17 ENCOUNTER — Encounter: Payer: Self-pay | Admitting: Nurse Practitioner

## 2015-08-17 ENCOUNTER — Ambulatory Visit (INDEPENDENT_AMBULATORY_CARE_PROVIDER_SITE_OTHER): Payer: Medicare Other | Admitting: Nurse Practitioner

## 2015-08-17 VITALS — BP 170/80 | HR 58 | Ht 61.0 in | Wt 166.0 lb

## 2015-08-17 DIAGNOSIS — Z8673 Personal history of transient ischemic attack (TIA), and cerebral infarction without residual deficits: Secondary | ICD-10-CM | POA: Diagnosis not present

## 2015-08-17 DIAGNOSIS — I10 Essential (primary) hypertension: Secondary | ICD-10-CM | POA: Diagnosis not present

## 2015-08-17 DIAGNOSIS — R569 Unspecified convulsions: Secondary | ICD-10-CM

## 2015-08-17 NOTE — Patient Instructions (Addendum)
Continue Keppra 500 twice a day for now Check trough Keppra level on Friday continue aspirin for secondary stroke prevention May need to adjust dose Follow-up in 6 months

## 2015-08-17 NOTE — Progress Notes (Signed)
[facility]  PATIENT: Arwen Delfino DOB: Feb 14, 1951   REASON FOR VISIT: follow-up for history of stroke and seizure disorder HISTORY FROM:patient and caregiver    HISTORY OF PRESENT ILLNESS:Makalynn Popko is a 65 yo RH female with her care giver [name], her PCP is 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, require prolonged ICU stay, mild mental delay, went to tenth grade, worked at the tobacco farm, never drive a car, she also had a history of coronary artery disease, cardiac stent, hypertension, hyperlipidemia, She had a history of epilepsy, generalized tonic-clonic seizure when she was young, since her most recent left occipital stroke early 2016, she began to have frequent spells, smelling dust smell, eyes crossed, transient confusion , she sometimes fell to the ground, lasting less than a minute, she can have few spells in one day.She presented to hospital multiple times for similar presentation, I have reviewed MRI of brain in May 2016, large left occipital stroke, mild small vessel disease at supratentorium.  Laboratory showed normal BMP, with exception of creatinine 1.3, mild low WBC 3.8 Echocardiogram April 2016, normal ejection fraction 55-60%, no significant structural abnormality,  She is now wearing a cardiac monitoring,   UPDATE June 28th 2016: She is with her care giver [name], She only had one episode of seizure like event, sudden onset of confusion, instead of daily similar episode since she started taking Keppra 500 mg twice a day, she has frequent almost daily headaches, We have reviewed EEG result, that was normal in June 2016 I have reviewed MRI of brain scan with patient, chronic left occipital stroke, supratentorium small vessel disease, no acute lesions  UPDATE Jan 31 2015: She could not tolerate Topamax, made her feel different, had diarrhea, she is back on Keppr 500mg   bid, she has two spells since June 2016,  She was taken to the hospital in June 14 and July 18 for seizure-like event, she is a little bit confused about her medications, she is tolerating Keppra much better  UPDATE April 19th 2017CM. Ms. Wier, 65 year old female returns for follow-up. She is previously followed by Dr. Krista Blue.She was placed on extended release Keppra after her last visit however she only took the medication for a couple of days and says she called in to the office but I do not see a phone note.She resumed 500 twice daily. Her mother died 04/13/23 and she had 2 seizures around that time and no further seizure events. She does not drive.She has not had further stroke or TIA symptoms. She has not had any falls. She returns for reevaluation     REVIEW OF SYSTEMS: Full 14 system review of systems performed and notable only for those listed, all others are neg:  Constitutional: neg  Cardiovascular: neg Ear/Nose/Throat: neg  Skin: neg Eyes: neg Respiratory: neg Gastroitestinal: neg  Hematology/Lymphatic: neg  Endocrine: neg Musculoskeletal:neg Allergy/Immunology: neg Neurological: neg Psychiatric: neg Sleep : neg   ALLERGIES: No Known Allergies  HOME MEDICATIONS: Outpatient Prescriptions Prior to Visit  Medication Sig Dispense Refill  . acetaminophen (TYLENOL) 325 MG tablet Take 650 mg by mouth every 6 (six) hours as needed.    [name] amLODipine (NORVASC) 10 MG tablet Take 1 tablet (10 mg total) by mouth daily. 90 tablet 0  . aspirin 81 MG tablet Take 81 mg by mouth daily.    . citalopram (CELEXA) 10 MG tablet Take 1 tablet (10  mg total) by mouth daily. 30 tablet 5  . dimenhyDRINATE (DRAMAMINE) 50 MG tablet Take 25 mg by mouth 3 (three) times daily.    . fluticasone (FLONASE) 50 MCG/ACT nasal spray Place 2 sprays into both nostrils daily as needed for allergies (allergies).     . Levetiracetam (KEPPRA XR) 750 MG TB24 2 tabs po qhs 60 tablet 11  . loratadine (CLARITIN) 10  MG tablet Take 1 tablet (10 mg total) by mouth daily. 90 tablet 1  . lovastatin (MEVACOR) 40 MG tablet Take 1 tablet (40 mg total) by mouth at bedtime. 90 tablet 3  . Meclizine HCl 25 MG CHEW Chew 1 tablet by mouth as needed (nausea; motion sickness).    . ondansetron (ZOFRAN) 4 MG tablet Take 4 mg by mouth every 4 (four) hours.     No facility-administered medications prior to visit.    PAST MEDICAL HISTORY: Past Medical History  Diagnosis Date  . Stroke [facility])     a. L PCA CVA in 05/2014.  . Diabetes mellitus ([place])   . Hypertension   . CKD (chronic kidney disease), stage III   . Sinus bradycardia   . PVC's (premature ventricular contractions)   . Orthostatic hypotension   . Carotid stenosis     a. CT angio head 07/2014 - 50-60% stenoses of prox ICA bilaterally.  . PUD (peptic ulcer disease)   . Syncope   . Seizures ([place])   . CAD (coronary artery disease)     a. Pt reports in 2000 she had a stent to a coronary artery, details unclear, outside hospital.  . CHF (congestive heart failure) ([place])     a. Pt reports in 2000 she had CHF, details unclear, outside hospital.  . Gout     PAST SURGICAL HISTORY: Past Surgical History  Procedure Laterality Date  . Cesarean section    . Dental surgery    . Stint    . Heart stent      FAMILY HISTORY: Family History  Problem Relation Age of Onset  . Stroke Mother   . Stroke Father   . Prostate cancer Father   . Diabetes Mellitus II Daughter   . Heart attack Mother   . Colon cancer Father   . Multiple sclerosis Daughter   . Kidney disease Son   . Cancer Paternal Grandfather   . Lung cancer Mother     SOCIAL HISTORY: Social History   Social History  . Marital Status: Single    Spouse Name: N/A  . Number of Children: 2  . Years of Education: 12   Occupational History  . Disabled    Social History Main Topics  . Smoking status: Former Smoker    Quit date: 03/30/2014  . Smokeless tobacco: Not on file  . Alcohol Use: No    . Drug Use: No  . Sexual Activity: Not on file   Other Topics Concern  . Not on file   Social History Narrative   Lives at home with her son.   Right-handed.   1 cup coffee per day.     PHYSICAL EXAM  Filed Vitals:   08/17/15 1630  BP: 170/80  Pulse: 58  Height: 5\' 1"  (1.549 m)  Weight: 166 lb (75.297 kg)   Body mass index is 31.38 kg/(m^2).  Generalized: Well developed, in no acute distress  Head: normocephalic and atraumatic,. Oropharynx benign  Neck: Supple, no carotid bruits  Cardiac: Regular rate rhythm, no murmur  Musculoskeletal: No deformity  Neurological examination   Mentation: Alert oriented to time, place, history taking. Attention span and concentration appropriate. Recent and remote memory intact.  Follows all commands speech and language fluent.   Cranial nerve II-XII: Fundoscopic exam reveals sharp disc margins.Pupils were equal round reactive to light extraocular movements were full, visual field were full on confrontational test. Right field cut. Facial sensation and strength were normal. hearing was intact to finger rubbing bilaterally. Uvula tongue midline. head turning and shoulder shrug were normal and symmetric.Tongue protrusion into cheek strength was normal. Motor: normal bulk and tone, full strength in the BUE, BLE, fine finger movements normal, no pronator drift. No focal weakness Sensory: normal and symmetric to light touch, pinprick, and  Vibration,  Coordination: finger-nose-finger, heel-to-shin bilaterally, no dysmetria Reflexes: Brachioradialis 2/2, biceps 2/2, triceps 2/2, patellar 2/2, Achilles 2/2, plantar responses were flexor bilaterally. Gait and Station: Rising up from seated position without assistance, wide based, mildly unsteady gait no assistive device.  DIAGNOSTIC DATA (LABS, IMAGING, TESTING) - I reviewed patient records, labs, notes, testing and imaging myself where available.  Lab Results  Component Value Date   WBC 3.5*  11/15/2014   HGB 13.2 11/15/2014   HCT 38.8 11/15/2014   MCV 93.7 11/15/2014   PLT 173 11/15/2014      Component Value Date/Time   NA 138 11/15/2014 1123   NA 142 08/19/2014 0822   K 4.3 11/15/2014 1123   K 4.1 08/19/2014 0822   CL 107 11/15/2014 1123   CL 109 08/19/2014 0822   CO2 23 11/15/2014 1123   CO2 26 08/19/2014 0822   GLUCOSE 97 11/15/2014 1123   GLUCOSE 110* 08/19/2014 0822   BUN 16 11/15/2014 1123   BUN 16 08/19/2014 0822   CREATININE 1.26* 11/15/2014 1123   CREATININE 1.39* 08/19/2014 0822   CALCIUM 10.0 11/15/2014 1123   CALCIUM 10.2 08/19/2014 0822   PROT 6.5 10/12/2014 0945   PROT 7.3 07/24/2014 0915   ALBUMIN 3.5 10/12/2014 0945   ALBUMIN 4.1 07/24/2014 0915   AST 15 10/12/2014 0945   AST 31 07/24/2014 0915   ALT 12* 10/12/2014 0945   ALT 29 07/24/2014 0915   ALKPHOS 52 10/12/2014 0945   ALKPHOS 53 07/24/2014 0915   BILITOT 0.4 10/12/2014 0945   BILITOT 0.9 07/24/2014 0915   GFRNONAA 44* 11/15/2014 1123   GFRNONAA 40* 08/19/2014 0822   GFRNONAA 27* 04/21/2014 1442   GFRAA 42* 11/15/2014 1123   GFRAA 60* 08/19/2014 0822   GFRAA 33* 04/21/2014 1442     ASSESSMENT AND PLAN  65 y.o. year old female  has a past medical history of Stroke ([place]); migraine headaches and complex partial seizure disorder.  PLAN;Continue Keppra 500 twice a day for now Check trough Keppra level on Friday continue aspirin for secondary stroke prevention May need to adjust Keppra dose Follow-up in 6 months Dennie Bible, [facility], [facility], APRN  [facility] [address], [place], [place] [address] [phone]

## 2015-08-17 NOTE — Progress Notes (Signed)
I have reviewed and agreed above plan. 

## 2015-08-19 ENCOUNTER — Other Ambulatory Visit (INDEPENDENT_AMBULATORY_CARE_PROVIDER_SITE_OTHER): Payer: Self-pay

## 2015-08-19 DIAGNOSIS — R569 Unspecified convulsions: Secondary | ICD-10-CM | POA: Diagnosis not present

## 2015-08-19 DIAGNOSIS — Z0289 Encounter for other administrative examinations: Secondary | ICD-10-CM

## 2015-08-22 ENCOUNTER — Telehealth: Payer: Self-pay | Admitting: Nurse Practitioner

## 2015-08-22 LAB — LEVETIRACETAM LEVEL: LEVETIRACETAM: 16.3 ug/mL (ref 10.0–40.0)

## 2015-08-22 NOTE — Telephone Encounter (Signed)
-----   Message from Dennie Bible, NP sent at 08/22/2015  8:21 AM EDT ----- Keppra level good continue same dose

## 2015-08-22 NOTE — Telephone Encounter (Signed)
Called and spoke to patient Keppra level was good and keep on same Dose. Patient understood details.

## 2015-08-22 NOTE — Progress Notes (Signed)
Quick Note:  Called and spoke to patient relayed to [place] level was good and to keep RX at same dose. Patient understood. ______

## 2015-09-05 ENCOUNTER — Other Ambulatory Visit: Payer: Self-pay | Admitting: *Deleted

## 2015-09-05 MED ORDER — LEVETIRACETAM 500 MG PO TABS: 500.0000 mg | ORAL_TABLET | Freq: Two times a day (BID) | ORAL | Status: DC

## 2015-09-16 ENCOUNTER — Ambulatory Visit: Payer: Self-pay | Admitting: Family Medicine

## 2015-10-05 ENCOUNTER — Ambulatory Visit: Payer: Self-pay | Admitting: Family Medicine

## 2015-10-07 ENCOUNTER — Ambulatory Visit: Payer: Self-pay | Admitting: Family Medicine

## 2015-11-02 ENCOUNTER — Other Ambulatory Visit: Payer: Self-pay

## 2015-11-02 DIAGNOSIS — I10 Essential (primary) hypertension: Secondary | ICD-10-CM

## 2015-11-02 MED ORDER — AMLODIPINE BESYLATE 10 MG PO TABS: 10.0000 mg | ORAL_TABLET | Freq: Every day | ORAL | Status: DC

## 2015-11-02 NOTE — Telephone Encounter (Signed)
Refill request was sent to Dr. Krichna Sowles for approval and submission.  

## 2015-12-02 ENCOUNTER — Other Ambulatory Visit: Payer: Self-pay

## 2015-12-02 DIAGNOSIS — F418 Other specified anxiety disorders: Secondary | ICD-10-CM

## 2015-12-02 MED ORDER — LOVASTATIN 40 MG PO TABS: 40.0000 mg | ORAL_TABLET | Freq: Every day | ORAL | 0 refills | Status: DC

## 2015-12-02 MED ORDER — CITALOPRAM HYDROBROMIDE 10 MG PO TABS: 10.0000 mg | ORAL_TABLET | Freq: Every day | ORAL | 0 refills | Status: DC

## 2015-12-02 NOTE — Telephone Encounter (Signed)
Patient had three no shows in a row, then cancelled a fourth visit between April 17th and June 9th She needs to be seen I will approve five days of medicine I will suggest discharge from practice for next missed appointment or refusal to come in for visit

## 2015-12-05 ENCOUNTER — Telehealth: Payer: Self-pay | Admitting: Family Medicine

## 2015-12-05 DIAGNOSIS — F418 Other specified anxiety disorders: Secondary | ICD-10-CM

## 2015-12-05 MED ORDER — LOVASTATIN 40 MG PO TABS: 40.0000 mg | ORAL_TABLET | Freq: Every day | ORAL | 0 refills | Status: DC

## 2015-12-05 MED ORDER — CITALOPRAM HYDROBROMIDE 10 MG PO TABS: 10.0000 mg | ORAL_TABLET | Freq: Every day | ORAL | 0 refills | Status: DC

## 2015-12-05 NOTE — Telephone Encounter (Signed)
Will get fasting labs at visit; Rxs sent

## 2015-12-06 ENCOUNTER — Other Ambulatory Visit: Payer: Self-pay

## 2015-12-06 MED ORDER — ASPIRIN EC 81 MG PO TBEC: 81.0000 mg | DELAYED_RELEASE_TABLET | Freq: Every day | ORAL | 1 refills | Status: DC

## 2015-12-30 ENCOUNTER — Encounter: Payer: Self-pay | Admitting: Family Medicine

## 2015-12-30 ENCOUNTER — Telehealth: Payer: Self-pay | Admitting: Family Medicine

## 2015-12-30 ENCOUNTER — Ambulatory Visit (INDEPENDENT_AMBULATORY_CARE_PROVIDER_SITE_OTHER): Payer: Medicare Other | Admitting: Family Medicine

## 2015-12-30 VITALS — BP 126/78 | HR 62 | Temp 98.0°F | Resp 14 | Wt 156.0 lb

## 2015-12-30 DIAGNOSIS — R569 Unspecified convulsions: Secondary | ICD-10-CM

## 2015-12-30 DIAGNOSIS — R339 Retention of urine, unspecified: Secondary | ICD-10-CM

## 2015-12-30 DIAGNOSIS — Z1231 Encounter for screening mammogram for malignant neoplasm of breast: Secondary | ICD-10-CM | POA: Diagnosis not present

## 2015-12-30 DIAGNOSIS — N183 Chronic kidney disease, stage 3 unspecified: Secondary | ICD-10-CM

## 2015-12-30 DIAGNOSIS — L84 Corns and callosities: Secondary | ICD-10-CM | POA: Diagnosis not present

## 2015-12-30 DIAGNOSIS — Z5181 Encounter for therapeutic drug level monitoring: Secondary | ICD-10-CM | POA: Diagnosis not present

## 2015-12-30 DIAGNOSIS — Z1159 Encounter for screening for other viral diseases: Secondary | ICD-10-CM | POA: Diagnosis not present

## 2015-12-30 DIAGNOSIS — E119 Type 2 diabetes mellitus without complications: Secondary | ICD-10-CM

## 2015-12-30 DIAGNOSIS — I10 Essential (primary) hypertension: Secondary | ICD-10-CM | POA: Diagnosis not present

## 2015-12-30 LAB — COMPLETE METABOLIC PANEL WITH GFR
ALBUMIN: 4.4 g/dL (ref 3.6–5.1)
ALT: 11 U/L (ref 6–29)
AST: 16 U/L (ref 10–35)
Alkaline Phosphatase: 62 U/L (ref 33–130)
BILIRUBIN TOTAL: 0.4 mg/dL (ref 0.2–1.2)
BUN: 22 mg/dL (ref 7–25)
CALCIUM: 9.8 mg/dL (ref 8.6–10.4)
CO2: 27 mmol/L (ref 20–31)
CREATININE: 1.38 mg/dL — AB (ref 0.50–0.99)
Chloride: 104 mmol/L (ref 98–110)
GFR, EST AFRICAN AMERICAN: 46 mL/min — AB (ref >=60)
GFR, EST NON AFRICAN AMERICAN: 40 mL/min — AB (ref >=60)
Glucose, Bld: 78 mg/dL (ref 65–99)
POTASSIUM: 4.1 mmol/L (ref 3.5–5.3)
Sodium: 138 mmol/L (ref 135–146)
Total Protein: 7.3 g/dL (ref 6.1–8.1)

## 2015-12-30 LAB — CBC WITH DIFFERENTIAL/PLATELET
BASOS ABS: 27 {cells}/uL (ref 0–200)
Basophils Relative: 1 %
EOS ABS: 54 {cells}/uL (ref 15–500)
Eosinophils Relative: 2 %
HEMATOCRIT: 40.6 % (ref 35.0–45.0)
HEMOGLOBIN: 13.6 g/dL (ref 11.7–15.5)
LYMPHS ABS: 1080 {cells}/uL (ref 850–3900)
LYMPHS PCT: 40 %
MCH: 32.2 pg (ref 27.0–33.0)
MCHC: 33.5 g/dL (ref 32.0–36.0)
MCV: 96.2 fL (ref 80.0–100.0)
MONO ABS: 297 {cells}/uL (ref 200–950)
MPV: 9.9 fL (ref 7.5–12.5)
Monocytes Relative: 11 %
NEUTROS PCT: 46 %
Neutro Abs: 1242 cells/uL — ABNORMAL LOW (ref 1500–7800)
Platelets: 203 10*3/uL (ref 140–400)
RBC: 4.22 MIL/uL (ref 3.80–5.10)
RDW: 12.2 % (ref 11.0–15.0)
WBC: 2.7 10*3/uL — ABNORMAL LOW (ref 3.8–10.8)

## 2015-12-30 LAB — HEPATITIS C ANTIBODY: HCV Ab: NEGATIVE

## 2015-12-30 LAB — LIPID PANEL
CHOL/HDL RATIO: 2 ratio (ref <=5.0)
Cholesterol: 148 mg/dL (ref 125–200)
HDL: 74 mg/dL (ref >=46)
LDL Cholesterol: 63 mg/dL (ref <130)
Triglycerides: 53 mg/dL (ref <150)
VLDL: 11 mg/dL (ref <30)

## 2015-12-30 NOTE — Progress Notes (Signed)
BP 126/78   Pulse 62   Temp 98 F (36.7 C) (Oral)   Resp 14   Wt 156 lb (70.8 kg)   SpO2 97%   BMI 29.48 kg/m    Subjective:    Patient ID: Leslie Houston, female    DOB: 26-Sep-1950, 65 y.o.   MRN: [id number]  HPI: Leslie Houston is a 65 y.o. female  Chief Complaint  Patient presents with  . Medication Refill   Patient is new to me today; she has not been seen at this practice since at least June 2016  Type 2 diabetes mellitus, diet-controlled; just watching what she eats; does drink Kool-Aid, but agrees to stay away; does eat white bread, but tries to eat wheat more often; last eye exam was awhile ago, but will go to [place]; no numbness or sores on the feet, then remembers she has something on the bottom of one of her feet  CKD; sees nephrologist; only takes tylenol; avoids NSAIDs  Chronic UTIs; was seeing urologist  Was having seizures and controlled, managed by Dr. Krista Blue  HTN; checks BP away from doctor, 118/67, sometimes 126/76; knows to stay away from salt  Patient declines flu shot  Depression screen [facility] 2/9 12/30/2015  Decreased Interest 0  Down, Depressed, Hopeless 0  PHQ - 2 Score 0    Relevant past medical, surgical, family and social history reviewed Past Medical History:  Diagnosis Date  . CAD (coronary artery disease)    a. Pt reports in 2000 she had a stent to a coronary artery, details unclear, outside hospital.  . Carotid stenosis    a. CT angio head 07/2014 - 50-60% stenoses of prox ICA bilaterally.  . CHF (congestive heart failure) ([place])    a. Pt reports in 2000 she had CHF, details unclear, outside hospital.  . CKD (chronic kidney disease), stage III   . Diabetes mellitus ([place])   . Gout   . Hypertension   . Orthostatic hypotension   . PUD (peptic ulcer disease)   . PVC's (premature ventricular contractions)   . Seizures ([place])   . Sinus bradycardia   . Stroke [facility])    a. L PCA CVA in 05/2014.  [name] Syncope    Past Surgical History:    Procedure Laterality Date  . CESAREAN SECTION    . DENTAL SURGERY    . Heart stent    . stint     Family History  Problem Relation Age of Onset  . Stroke Mother   . Heart attack Mother   . Lung cancer Mother   . Stroke Father   . Prostate cancer Father   . Colon cancer Father   . Diabetes Mellitus II Daughter   . Multiple sclerosis Daughter   . Kidney disease Son   . Cancer Paternal Grandfather    Social History  Substance Use Topics  . Smoking status: Former Smoker    Quit date: 03/30/2014  . Smokeless tobacco: Not on file  . Alcohol use No    Interim medical history since last visit reviewed. Allergies and medications reviewed  Review of Systems Per HPI unless specifically indicated above     Objective:    BP 126/78   Pulse 62   Temp 98 F (36.7 C) (Oral)   Resp 14   Wt 156 lb (70.8 kg)   SpO2 97%   BMI 29.48 kg/m   Wt Readings from Last 3 Encounters:  12/30/15 156 lb (70.8 kg)  08/17/15 166 lb (  75.3 kg)  01/31/15 167 lb (75.8 kg)    Physical Exam  Constitutional: She appears well-developed and well-nourished. No distress.  HENT:  Head: Normocephalic and atraumatic.  Eyes: EOM are normal. No scleral icterus.  Neck: No thyromegaly present.  Cardiovascular: Normal rate, regular rhythm and normal heart sounds.   No murmur heard. Pulmonary/Chest: Effort normal and breath sounds normal. No respiratory distress. She has no wheezes.  Abdominal: Soft. Bowel sounds are normal. She exhibits no distension.  Musculoskeletal: Normal range of motion. She exhibits no edema.  Neurological: She is alert. She exhibits normal muscle tone.  Skin: Skin is warm and dry. She is not diaphoretic. No pallor.  Psychiatric: She has a normal mood and affect.    Diabetic Foot Form - Detailed   Diabetic Foot Exam - detailed Diabetic Foot exam was performed with the following findings:  Yes 12/30/2015  4:57 PM  Visual Foot Exam completed.:  Yes  Are the toenails ingrown?:   No Normal Range of Motion:  Yes Pulse Foot Exam completed.:  Yes  Right Dorsalis Pedis:  Present Left Dorsalis Pedis:  Present  Sensory Foot Exam Completed.:  Yes Semmes-Weinstein Monofilament Test R Site 1-Great Toe:  Pos L Site 1-Great Toe:  Pos  R Site 4:  Pos L Site 4:  Pos  R Site 5:  Pos L Site 5:  Pos    Comments:  Callus versus verrucous lesion plantar surface of each foot    Results for orders placed or performed in visit on 08/17/15  Levetiracetam level  Result Value Ref Range   Levetiracetam Lvl 16.3 10.0 - 40.0 ug/mL      Assessment & Plan:   Problem List Items Addressed This Visit      Cardiovascular and Mediastinum   Hypertension - Primary    Continue to avoid salt and try to follow the DASH guidelines        Endocrine   Diabetes mellitus (HCC)    Check A1c, urine microalbumin; foot exam by MD      Relevant Orders   Hemoglobin A1c   Lipid panel   Microalbumin / creatinine urine ratio     Genitourinary   Urinary retention    Managed by urologist      CKD (chronic kidney disease) stage 3, GFR 30-59 ml/min    Avoid NSAIDs; use tylenol per package directions; stay hydrated; check labs        Other   Visit for screening mammogram    Order mammo      Relevant Orders   MM DIGITAL SCREENING BILATERAL   Seizures ([place])    Managed by neurologist, Dr. Krista Blue      Need for hepatitis C screening test    Discussed one-time hep C screening recommendation for individuals born between 1945-1965 per USPSTF guidelines; patient agrees with testing; Hep C Ab ordered      Relevant Orders   Hepatitis C antibody   Encounter for medication monitoring    Check labs      Relevant Orders   CBC with Differential/Platelet   COMPLETE METABOLIC PANEL WITH GFR    Other Visit Diagnoses    Callus of foot       Relevant Orders   Ambulatory referral to Podiatry      Follow up plan: Return in about 3 months (around 03/30/2016) for diabetes, cholesterol, blood  pressure.  An after-visit summary was printed and given to the patient at [facility].  Please see the patient instructions  which may contain other information and recommendations beyond what is mentioned above in the assessment and plan.  Meds ordered this encounter  Medications  . acetaminophen (TYLENOL) 500 MG tablet    Sig: Take 1 tablet (500 mg total) by mouth every 6 (six) hours as needed.    Orders Placed This Encounter  Procedures  . MM DIGITAL SCREENING BILATERAL  . CBC with Differential/Platelet  . Hemoglobin A1c  . Lipid panel  . COMPLETE METABOLIC PANEL WITH GFR  . Hepatitis C antibody  . Microalbumin / creatinine urine ratio  . Ambulatory referral to Podiatry

## 2015-12-30 NOTE — Assessment & Plan Note (Signed)
Check labs 

## 2015-12-30 NOTE — Assessment & Plan Note (Signed)
Managed by neurologist, Dr. Krista Blue

## 2015-12-30 NOTE — Telephone Encounter (Signed)
Thank you, I'll put that referral in right now

## 2015-12-30 NOTE — Patient Instructions (Signed)
Your goal blood pressure is less than 140 mmHg on top. Try to follow the DASH guidelines (DASH stands for Dietary Approaches to Stop Hypertension) Try to limit the sodium in your diet.  Ideally, consume less than 1.5 grams (less than 1,500mg ) per day. Do not add salt when cooking or at the table.  Check the sodium amount on labels when shopping, and choose items lower in sodium when given a choice. Avoid or limit foods that already contain a lot of sodium. Eat a diet rich in fruits and vegetables and whole grains.  Please do see your eye doctor regularly, and have your eyes examined every year (or more often per his or her recommendation) Check your feet every night and let me know right away of any sores, infections, numbness, etc. Try to limit sweets, white bread, white rice, white potatoes It is okay with me for you to not check your fingerstick blood sugars (per [employer] of Endocrinology Best Practices), unless you are interested and feel it would be helpful for you  Try to limit saturated fats in your diet (bologna, hot dogs, barbeque, cheeseburgers, hamburgers, steak, bacon, sausage, cheese, etc.) and get more fresh fruits, vegetables, and whole grains  We'll get labs today

## 2015-12-30 NOTE — Assessment & Plan Note (Signed)
Discussed one-time hep C screening recommendation for individuals born between 1945-1965 per USPSTF guidelines; patient agrees with testing; Hep C Ab ordered 

## 2015-12-30 NOTE — Assessment & Plan Note (Addendum)
Avoid NSAIDs; use tylenol per package directions; stay hydrated; check labs

## 2015-12-30 NOTE — Assessment & Plan Note (Signed)
Continue to avoid salt and try to follow the DASH guidelines

## 2015-12-30 NOTE — Assessment & Plan Note (Signed)
Managed by urologist 

## 2015-12-30 NOTE — Assessment & Plan Note (Signed)
Order mammo 

## 2015-12-30 NOTE — Assessment & Plan Note (Signed)
Check A1c, urine microalbumin; foot exam by MD

## 2015-12-31 ENCOUNTER — Other Ambulatory Visit: Payer: Self-pay | Admitting: Family Medicine

## 2015-12-31 DIAGNOSIS — N183 Chronic kidney disease, stage 3 unspecified: Secondary | ICD-10-CM

## 2015-12-31 DIAGNOSIS — D709 Neutropenia, unspecified: Secondary | ICD-10-CM | POA: Insufficient documentation

## 2015-12-31 LAB — MICROALBUMIN / CREATININE URINE RATIO
CREATININE, URINE: 103 mg/dL (ref 20–320)
Microalb Creat Ratio: 8 mcg/mg creat (ref <30)
Microalb, Ur: 0.8 mg/dL

## 2015-12-31 LAB — HEMOGLOBIN A1C
Hgb A1c MFr Bld: 4.8 % (ref <5.7)
Mean Plasma Glucose: 91 mg/dL

## 2015-12-31 NOTE — Assessment & Plan Note (Signed)
Not sure if medicine-related; recheck in 1 month

## 2015-12-31 NOTE — Progress Notes (Signed)
Entered orders for recheck labs in about 1 month

## 2015-12-31 NOTE — Assessment & Plan Note (Signed)
Recheck after stopping all NSAIDs, hydrating

## 2016-01-05 ENCOUNTER — Other Ambulatory Visit: Payer: Self-pay

## 2016-01-05 DIAGNOSIS — F418 Other specified anxiety disorders: Secondary | ICD-10-CM

## 2016-01-05 MED ORDER — CITALOPRAM HYDROBROMIDE 10 MG PO TABS: 10.0000 mg | ORAL_TABLET | Freq: Every day | ORAL | 5 refills | Status: DC

## 2016-01-05 NOTE — Telephone Encounter (Signed)
rx approved

## 2016-01-17 ENCOUNTER — Ambulatory Visit: Payer: Medicare Other | Admitting: Podiatry

## 2016-01-31 ENCOUNTER — Other Ambulatory Visit: Payer: Self-pay

## 2016-01-31 DIAGNOSIS — I10 Essential (primary) hypertension: Secondary | ICD-10-CM

## 2016-01-31 MED ORDER — AMLODIPINE BESYLATE 10 MG PO TABS: 10.0000 mg | ORAL_TABLET | Freq: Every day | ORAL | 5 refills | Status: DC

## 2016-01-31 NOTE — Telephone Encounter (Signed)
Reviewed last BP, note; Rx approved

## 2016-02-20 ENCOUNTER — Ambulatory Visit: Payer: Medicare Other | Admitting: Nurse Practitioner

## 2016-02-20 ENCOUNTER — Encounter: Payer: Self-pay | Admitting: Nurse Practitioner

## 2016-02-20 ENCOUNTER — Ambulatory Visit (INDEPENDENT_AMBULATORY_CARE_PROVIDER_SITE_OTHER): Payer: Medicare Other | Admitting: Nurse Practitioner

## 2016-02-20 VITALS — BP 145/63 | HR 54 | Ht 61.0 in | Wt 162.0 lb

## 2016-02-20 DIAGNOSIS — R569 Unspecified convulsions: Secondary | ICD-10-CM | POA: Diagnosis not present

## 2016-02-20 DIAGNOSIS — Z8673 Personal history of transient ischemic attack (TIA), and cerebral infarction without residual deficits: Secondary | ICD-10-CM

## 2016-02-20 DIAGNOSIS — I10 Essential (primary) hypertension: Secondary | ICD-10-CM

## 2016-02-20 MED ORDER — LEVETIRACETAM 500 MG PO TABS: ORAL_TABLET | ORAL | 6 refills | Status: DC

## 2016-02-20 NOTE — Progress Notes (Signed)
[facility]  PATIENT: Leslie Houston DOB: [birth year]   REASON FOR VISIT: follow-up for history of stroke and recent seizure events HISTORY FROM:patient and caregiver    HISTORY OF PRESENT ILLNESS:Leslie Houston is a 65 yo RH female with her care giver [name], her PCP is 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, require prolonged ICU stay, mild mental delay, went to tenth grade, worked at the tobacco farm, never drive a car, she also had a history of coronary artery disease, cardiac stent, hypertension, hyperlipidemia, She had a history of epilepsy, generalized tonic-clonic seizure when she was young, since her most recent left occipital stroke early 2016, she began to have frequent spells, smelling dust smell, eyes crossed, transient confusion , she sometimes fell to the ground, lasting less than a minute, she can have few spells in one day.She presented to hospital multiple times for similar presentation, I have reviewed MRI of brain in May 2016, large left occipital stroke, mild small vessel disease at supratentorium.  Laboratory showed normal BMP, with exception of creatinine 1.3, mild low WBC 3.8 Echocardiogram April 2016, normal ejection fraction 55-60%, no significant structural abnormality,  She is now wearing a cardiac monitoring,   UPDATE June 28th 2016:YY She is with her care giver [name], She only had one episode of seizure like event, sudden onset of confusion, instead of daily similar episode since she started taking Keppra 500 mg twice a day, she has frequent almost daily headaches, We have reviewed EEG result, that was normal in June 2016 I have reviewed MRI of brain scan with patient, chronic left occipital stroke, supratentorium small vessel disease, no acute lesions  UPDATE Jan 31 2015:YY She could not tolerate Topamax, made her feel different, had diarrhea, she is back on Keppr  500mg  bid, she has two spells since June 2016,  She was taken to the hospital in June 14 and July 18 for seizure-like event, she is a little bit confused about her medications, she is tolerating Keppra much better  UPDATE April 19th 2017CM. Leslie Houston, 65 year old female returns for follow-up. She is previously followed by Dr. Krista Blue.She was placed on extended release Keppra after her last visit however she only took the medication for a couple of days and says she called in to the office but I do not see a phone note.She resumed 500 twice daily. Her mother died 04/26/2023 and she had 2 seizures around that time and no further seizure events. She does not drive.She has not had further stroke or TIA symptoms. She has not had any falls. She returns for reevaluation UPDATE 02/20/2016 CMMs. Leslie Houston, 65 year old female returns for follow-up with her caregiver. She has history of stroke and seizure disorder. She is currently on Keppra 500 twice daily she reports 2 episodes of seizure events since last seen. Most recent Keppra level was 16.3 in  April. She has not had further stroke or TIA symptoms and is currently on aspirin with minimal bruising and no bleeding. She gets little exercise but does walk. She denies any recent falls. She returns for reevaluation    REVIEW OF SYSTEMS: Full 14 system review of systems performed and notable only for those listed, all others are neg:  Constitutional: neg  Cardiovascular: neg Ear/Nose/Throat: neg  Skin: neg Eyes: Blurred vision Respiratory: neg Gastroitestinal: neg  Hematology/Lymphatic: neg  Endocrine: neg Musculoskeletal: Joint pain Allergy/Immunology: neg Neurological: Seizure disorder Psychiatric:  neg Sleep : neg   ALLERGIES: No Known Allergies  HOME MEDICATIONS: Outpatient Medications Prior to Visit  Medication Sig Dispense Refill  . acetaminophen (TYLENOL) 500 MG tablet Take 1 tablet (500 mg total) by mouth every 6 (six) hours as needed.    [name]  amLODipine (NORVASC) 10 MG tablet Take 1 tablet (10 mg total) by mouth daily. 30 tablet 5  . aspirin EC 81 MG tablet Take 1 tablet (81 mg total) by mouth daily. 30 tablet 1  . citalopram (CELEXA) 10 MG tablet Take 1 tablet (10 mg total) by mouth daily. 30 tablet 5  . fluticasone (FLONASE) 50 MCG/ACT nasal spray Place 2 sprays into both nostrils daily as needed for allergies (allergies).     [name] levETIRAcetam (KEPPRA) 500 MG tablet Take 1 tablet (500 mg total) by mouth 2 (two) times daily. 60 tablet 11  . loratadine (CLARITIN) 10 MG tablet Take 1 tablet (10 mg total) by mouth daily. 90 tablet 1  . lovastatin (MEVACOR) 40 MG tablet Take 1 tablet (40 mg total) by mouth at bedtime. 30 tablet 0  . Meclizine HCl 25 MG CHEW Chew 1 tablet by mouth as needed (nausea; motion sickness).     No facility-administered medications prior to visit.     PAST MEDICAL HISTORY: Past Medical History:  Diagnosis Date  . CAD (coronary artery disease)    a. Pt reports in 2000 she had a stent to a coronary artery, details unclear, outside hospital.  . Carotid stenosis    a. CT angio head 07/2014 - 50-60% stenoses of prox ICA bilaterally.  . CHF (congestive heart failure) ([place])    a. Pt reports in 2000 she had CHF, details unclear, outside hospital.  . CKD (chronic kidney disease), stage III   . Diabetes mellitus ([place])   . Gout   . Hypertension   . Orthostatic hypotension   . PUD (peptic ulcer disease)   . PVC's (premature ventricular contractions)   . Seizures ([place])   . Sinus bradycardia   . Stroke [facility])    a. L PCA CVA in 05/2014.  [name] Syncope     PAST SURGICAL HISTORY: Past Surgical History:  Procedure Laterality Date  . CESAREAN SECTION    . DENTAL SURGERY    . Heart stent    . stint      FAMILY HISTORY: Family History  Problem Relation Age of Onset  . Stroke Mother   . Heart attack Mother   . Lung cancer Mother   . Stroke Father   . Prostate cancer Father   . Colon cancer Father   . Diabetes  Mellitus II Daughter   . Multiple sclerosis Daughter   . Kidney disease Son   . Cancer Paternal Grandfather     SOCIAL HISTORY: Social History   Social History  . Marital status: Single    Spouse name: N/A  . Number of children: 2  . Years of education: 12   Occupational History  . Disabled    Social History Main Topics  . Smoking status: Former Smoker    Quit date: 03/30/2014  . Smokeless tobacco: Never Used  . Alcohol use No  . Drug use: No  . Sexual activity: Not on file   Other Topics Concern  . Not on file   Social History Narrative   Lives at home with her son.   Right-handed.   1 cup coffee per day.     PHYSICAL EXAM  Vitals:   02/20/16  1335  BP: (!) 145/63  Pulse: (!) 54  Weight: 162 lb (73.5 kg)  Height: 5\' 1"  (1.549 m)   Body mass index is 30.61 kg/m.  Generalized: Well developed, in no acute distress  Head: normocephalic and atraumatic,. Oropharynx benign  Neck: Supple, no carotid bruits  Cardiac: Regular rate rhythm, no murmur  Musculoskeletal: No deformity   Neurological examination   Mentation: Alert oriented to time, place, history taking. Attention span and concentration appropriate. Recent and remote memory intact.  Follows all commands speech and language fluent.   Cranial nerve II-XII: Fundoscopic exam reveals sharp disc margins.Pupils were equal round reactive to light extraocular movements were full, visual field were full on confrontational test. Right field cut. Facial sensation and strength were normal. hearing was intact to finger rubbing bilaterally. Uvula tongue midline. head turning and shoulder shrug were normal and symmetric.Tongue protrusion into cheek strength was normal. Motor: normal bulk and tone, full strength in the BUE, BLE, fine finger movements normal, no pronator drift. No focal weakness Sensory: normal and symmetric to light touch, pinprick, and  Vibration,  Coordination: finger-nose-finger, heel-to-shin  bilaterally, no dysmetria Reflexes: Brachioradialis 2/2, biceps 2/2, triceps 2/2, patellar 2/2, Achilles 2/2, plantar responses were flexor bilaterally. Gait and Station: Rising up from seated position without assistance, wide based, mildly unsteady gait no assistive device.  DIAGNOSTIC DATA (LABS, IMAGING, TESTING) - I reviewed patient records, labs, notes, testing and imaging myself where available.  Lab Results  Component Value Date   WBC 2.7 (L) 12/30/2015   HGB 13.6 12/30/2015   HCT 40.6 12/30/2015   MCV 96.2 12/30/2015   PLT 203 12/30/2015      Component Value Date/Time   NA 138 12/30/2015 1429   NA 142 08/19/2014 0822   K 4.1 12/30/2015 1429   K 4.1 08/19/2014 0822   CL 104 12/30/2015 1429   CL 109 08/19/2014 0822   CO2 27 12/30/2015 1429   CO2 26 08/19/2014 0822   GLUCOSE 78 12/30/2015 1429   GLUCOSE 110 (H) 08/19/2014 0822   BUN 22 12/30/2015 1429   BUN 16 08/19/2014 0822   CREATININE 1.38 (H) 12/30/2015 1429   CALCIUM 9.8 12/30/2015 1429   CALCIUM 10.2 08/19/2014 0822   PROT 7.3 12/30/2015 1429   PROT 7.3 07/24/2014 0915   ALBUMIN 4.4 12/30/2015 1429   ALBUMIN 4.1 07/24/2014 0915   AST 16 12/30/2015 1429   AST 31 07/24/2014 0915   ALT 11 12/30/2015 1429   ALT 29 07/24/2014 0915   ALKPHOS 62 12/30/2015 1429   ALKPHOS 53 07/24/2014 0915   BILITOT 0.4 12/30/2015 1429   BILITOT 0.9 07/24/2014 0915   GFRNONAA 40 (L) 12/30/2015 1429   GFRAA 46 (L) 12/30/2015 1429     ASSESSMENT AND PLAN  65 y.o. year old female  has a past medical history of Stroke [facility]); migraine headaches and complex partial seizure disorder with 2 seizure events since last seen.   PLAN;Increase  Keppra to 500 mg every morning and 2 caps at night will refill Call for seizure activity continue aspirin for secondary stroke prevention Keep systolic blood pressure 846/65 or less, today's reading 145/63 continue blood pressure medications Follow-up in 6 months Dennie Bible, [facility], [facility],  [facility] [address] [place], [place] [address] [phone]

## 2016-02-20 NOTE — Patient Instructions (Addendum)
Increase  Keppra to 500 mg every morning and 2 caps at night continue aspirin for secondary stroke prevention Keep systolic blood pressure 015/61 or less, today's reading 145/63 continue blood pressure medications Call for seizure activity Follow-up in 6 months

## 2016-02-27 NOTE — Progress Notes (Signed)
I have reviewed and agreed above plan. 

## 2016-03-03 ENCOUNTER — Other Ambulatory Visit: Payer: Self-pay | Admitting: Family Medicine

## 2016-03-04 NOTE — Telephone Encounter (Signed)
Please remind patient to have labs done; CBC and BMP were due in early October; she can come in this week; thank you (I'm sending Rx as requested)

## 2016-03-05 ENCOUNTER — Other Ambulatory Visit: Payer: Self-pay

## 2016-03-05 DIAGNOSIS — N183 Chronic kidney disease, stage 3 unspecified: Secondary | ICD-10-CM

## 2016-03-05 DIAGNOSIS — D709 Neutropenia, unspecified: Secondary | ICD-10-CM

## 2016-03-05 NOTE — Telephone Encounter (Signed)
[facility] notified

## 2016-03-30 ENCOUNTER — Ambulatory Visit: Payer: Medicare Other | Admitting: Family Medicine

## 2016-04-02 ENCOUNTER — Other Ambulatory Visit: Payer: Self-pay

## 2016-04-02 MED ORDER — LORATADINE 10 MG PO TABS: 10.0000 mg | ORAL_TABLET | Freq: Every day | ORAL | 3 refills | Status: DC | PRN

## 2016-04-02 NOTE — Telephone Encounter (Signed)
rx approved

## 2016-04-02 NOTE — Telephone Encounter (Signed)
Patient requesting refill of Claritin to [facility].

## 2016-04-18 ENCOUNTER — Ambulatory Visit (INDEPENDENT_AMBULATORY_CARE_PROVIDER_SITE_OTHER): Payer: Medicare Other | Admitting: Family Medicine

## 2016-04-18 ENCOUNTER — Other Ambulatory Visit: Payer: Self-pay | Admitting: Family Medicine

## 2016-04-18 ENCOUNTER — Encounter: Payer: Self-pay | Admitting: Family Medicine

## 2016-04-18 DIAGNOSIS — D709 Neutropenia, unspecified: Secondary | ICD-10-CM | POA: Diagnosis not present

## 2016-04-18 DIAGNOSIS — N183 Chronic kidney disease, stage 3 unspecified: Secondary | ICD-10-CM

## 2016-04-18 DIAGNOSIS — I10 Essential (primary) hypertension: Secondary | ICD-10-CM

## 2016-04-18 DIAGNOSIS — Z23 Encounter for immunization: Secondary | ICD-10-CM | POA: Insufficient documentation

## 2016-04-18 DIAGNOSIS — R569 Unspecified convulsions: Secondary | ICD-10-CM | POA: Diagnosis not present

## 2016-04-18 DIAGNOSIS — E119 Type 2 diabetes mellitus without complications: Secondary | ICD-10-CM

## 2016-04-18 LAB — CBC WITH DIFFERENTIAL/PLATELET
BASOS ABS: 30 {cells}/uL (ref 0–200)
BASOS PCT: 1 %
EOS ABS: 60 {cells}/uL (ref 15–500)
Eosinophils Relative: 2 %
HEMATOCRIT: 40.2 % (ref 35.0–45.0)
HEMOGLOBIN: 13.5 g/dL (ref 11.7–15.5)
LYMPHS ABS: 1440 {cells}/uL (ref 850–3900)
Lymphocytes Relative: 48 %
MCH: 32 pg (ref 27.0–33.0)
MCHC: 33.6 g/dL (ref 32.0–36.0)
MCV: 95.3 fL (ref 80.0–100.0)
MONO ABS: 270 {cells}/uL (ref 200–950)
MPV: 9.9 fL (ref 7.5–12.5)
Monocytes Relative: 9 %
NEUTROS ABS: 1200 {cells}/uL — AB (ref 1500–7800)
Neutrophils Relative %: 40 %
Platelets: 215 10*3/uL (ref 140–400)
RBC: 4.22 MIL/uL (ref 3.80–5.10)
RDW: 12.3 % (ref 11.0–15.0)
WBC: 3 10*3/uL — AB (ref 3.8–10.8)

## 2016-04-18 LAB — BASIC METABOLIC PANEL WITH GFR
BUN: 17 mg/dL (ref 7–25)
CO2: 26 mmol/L (ref 20–31)
CREATININE: 1.28 mg/dL — AB (ref 0.50–0.99)
Calcium: 10 mg/dL (ref 8.6–10.4)
Chloride: 106 mmol/L (ref 98–110)
GFR, EST AFRICAN AMERICAN: 50 mL/min — AB (ref >=60)
GFR, Est Non African American: 44 mL/min — ABNORMAL LOW (ref >=60)
Glucose, Bld: 71 mg/dL (ref 65–99)
Potassium: 4.6 mmol/L (ref 3.5–5.3)
SODIUM: 140 mmol/L (ref 135–146)

## 2016-04-18 MED ORDER — AMLODIPINE BESYLATE 5 MG PO TABS: 5.0000 mg | ORAL_TABLET | Freq: Every day | ORAL | 3 refills | Status: DC

## 2016-04-18 NOTE — Assessment & Plan Note (Signed)
Managed by Dr. Krista Blue

## 2016-04-18 NOTE — Assessment & Plan Note (Signed)
Offered and given today

## 2016-04-18 NOTE — Assessment & Plan Note (Signed)
Recheck CBC today. 

## 2016-04-18 NOTE — Progress Notes (Signed)
BP 122/63 (BP Location: Left Arm, Patient Position: Sitting, Cuff Size: Normal)   Pulse 68   Temp 98 F (36.7 C) (Oral)   Resp 16   Wt 163 lb (73.9 kg)   SpO2 98%   BMI 30.80 kg/m    Subjective:    Patient ID: Leslie Houston, female    DOB: December 15, 1950, 65 y.o.   MRN: [id number]  HPI: Leslie Houston is a 65 y.o. female  Chief Complaint  Patient presents with  . Follow-up   Patient is here for f/u The neurologist told her not to get a flu shot because of fear for seizures The seizures have been really tough to treat; seeing Dr. Krista Blue in [place] (neurologist) Last seizure was about 9 months ago maybe, perhaps 1-2 light ones since then  Reduced kidney function; she had not been taking any NSAIDs prior to that check she says  She is seeing urologist; urine is okay; no strong odor; no dysuria  Her white count was low too; no recent infections, no boils  Reviewed A1c and lipids and they were excellent  Hx of stroke; a little weakness in the right arm and right leg; politely declined referral to PT  Depression screen [facility] 2/9 04/18/2016 12/30/2015  Decreased Interest 0 0  Down, Depressed, Hopeless 0 0  PHQ - 2 Score 0 0   Relevant past medical, surgical, family and social history reviewed Past Medical History:  Diagnosis Date  . CAD (coronary artery disease)    a. Pt reports in 2000 she had a stent to a coronary artery, details unclear, outside hospital.  . Carotid stenosis    a. CT angio head 07/2014 - 50-60% stenoses of prox ICA bilaterally.  . CHF (congestive heart failure) ([place])    a. Pt reports in 2000 she had CHF, details unclear, outside hospital.  . CKD (chronic kidney disease), stage III   . Diabetes mellitus ([place])   . Gout   . Hypertension   . Orthostatic hypotension   . PUD (peptic ulcer disease)   . PVC's (premature ventricular contractions)   . Seizures ([place])   . Sinus bradycardia   . Stroke [facility])    a. L PCA CVA in 05/2014.  [name] Syncope    Past  Surgical History:  Procedure Laterality Date  . CESAREAN SECTION    . DENTAL SURGERY    . Heart stent    . stint     Family History  Problem Relation Age of Onset  . Stroke Mother   . Heart attack Mother   . Lung cancer Mother   . Stroke Father   . Prostate cancer Father   . Colon cancer Father   . Diabetes Mellitus II Daughter   . Multiple sclerosis Daughter   . Kidney disease Son   . Cancer Paternal Grandfather    Social History  Substance Use Topics  . Smoking status: Former Smoker    Quit date: 03/30/2014  . Smokeless tobacco: Never Used  . Alcohol use No   Interim medical history since last visit reviewed. Allergies and medications reviewed  Review of Systems Per HPI unless specifically indicated above     Objective:    BP 122/63 (BP Location: Left Arm, Patient Position: Sitting, Cuff Size: Normal)   Pulse 68   Temp 98 F (36.7 C) (Oral)   Resp 16   Wt 163 lb (73.9 kg)   SpO2 98%   BMI 30.80 kg/m   Wt Readings from Last 3  Encounters:  04/18/16 163 lb (73.9 kg)  02/20/16 162 lb (73.5 kg)  12/30/15 156 lb (70.8 kg)    Physical Exam  Constitutional: She appears well-developed and well-nourished. No distress.  HENT:  Head: Normocephalic and atraumatic.  Eyes: EOM are normal. No scleral icterus.  Neck: No thyromegaly present.  Cardiovascular: Normal rate, regular rhythm and normal heart sounds.   No murmur heard. Pulmonary/Chest: Effort normal and breath sounds normal. No respiratory distress. She has no wheezes.  Abdominal: Soft. Bowel sounds are normal. She exhibits no distension.  Musculoskeletal: Normal range of motion. She exhibits no edema.  Neurological: She is alert. She exhibits normal muscle tone.  Skin: Skin is warm and dry. She is not diaphoretic. No pallor.  Psychiatric: She has a normal mood and affect.  Fair historian   Results for orders placed or performed in visit on 12/30/15  CBC with Differential/Platelet  Result Value Ref Range     WBC 2.7 (L) 3.8 - 10.8 K/uL   RBC 4.22 3.80 - 5.10 MIL/uL   Hemoglobin 13.6 11.7 - 15.5 g/dL   HCT 40.6 35.0 - 45.0 %   MCV 96.2 80.0 - 100.0 fL   MCH 32.2 27.0 - 33.0 pg   MCHC 33.5 32.0 - 36.0 g/dL   RDW 12.2 11.0 - 15.0 %   Platelets 203 140 - 400 K/uL   MPV 9.9 7.5 - 12.5 fL   Neutro Abs 1,242 (L) 1,500 - 7,800 cells/uL   Lymphs Abs 1,080 850 - 3,900 cells/uL   Monocytes Absolute 297 200 - 950 cells/uL   Eosinophils Absolute 54 15 - 500 cells/uL   Basophils Absolute 27 0 - 200 cells/uL   Neutrophils Relative % 46 %   Lymphocytes Relative 40 %   Monocytes Relative 11 %   Eosinophils Relative 2 %   Basophils Relative 1 %   Smear Review Criteria for review not met   Hemoglobin A1c  Result Value Ref Range   Hgb A1c MFr Bld 4.8 <5.7 %   Mean Plasma Glucose 91 mg/dL  Lipid panel  Result Value Ref Range   Cholesterol 148 125 - 200 mg/dL   Triglycerides 53 <150 mg/dL   HDL 74 >=46 mg/dL   Total CHOL/HDL Ratio 2.0 <=5.0 Ratio   VLDL 11 <30 mg/dL   LDL Cholesterol 63 <130 mg/dL  COMPLETE METABOLIC PANEL WITH GFR  Result Value Ref Range   Sodium 138 135 - 146 mmol/L   Potassium 4.1 3.5 - 5.3 mmol/L   Chloride 104 98 - 110 mmol/L   CO2 27 20 - 31 mmol/L   Glucose, Bld 78 65 - 99 mg/dL   BUN 22 7 - 25 mg/dL   Creat 1.38 (H) 0.50 - 0.99 mg/dL   Total Bilirubin 0.4 0.2 - 1.2 mg/dL   Alkaline Phosphatase 62 33 - 130 U/L   AST 16 10 - 35 U/L   ALT 11 6 - 29 U/L   Total Protein 7.3 6.1 - 8.1 g/dL   Albumin 4.4 3.6 - 5.1 g/dL   Calcium 9.8 8.6 - 10.4 mg/dL   GFR, Est African American 46 (L) >=60 mL/min   GFR, Est Non African American 40 (L) >=60 mL/min  Hepatitis C antibody  Result Value Ref Range   HCV Ab NEGATIVE NEGATIVE  Microalbumin / creatinine urine ratio  Result Value Ref Range   Creatinine, Urine 103 20 - 320 mg/dL   Microalb, Ur 0.8 Not estab mg/dL   Microalb Creat Ratio 8 <  30 mcg/mg creat      Assessment & Plan:   Problem List Items Addressed This Visit       Cardiovascular and Mediastinum   Essential hypertension    Excellent control; try the DASH guidelines      Relevant Medications   amLODipine (NORVASC) 5 MG tablet     Endocrine   Diabetes mellitus ([place])    She may have had diabetes years ago, but her A1c last time was excellent, not even prediabetic range        Genitourinary   CKD (chronic kidney disease) stage 3, GFR 30-59 ml/min    Avoid NSAIDs; encouraged adequate hydration        Other   Seizures ([place])    Managed by Dr. Krista Blue      Neutropenia [facility])    Recheck CBC today      Need for vaccination with 13-polyvalent pneumococcal conjugate vaccine    Offered and given today      Relevant Orders   Pneumococcal conjugate vaccine 13-valent IM (Completed)      Follow up plan: Return in about 2 weeks (around 05/02/2016) for [place] visit for blood pressure; 6 months with Dr. Sanda Klein.  An after-visit summary was printed and given to the patient at [place].  Please see the patient instructions which may contain other information and recommendations beyond what is mentioned above in the assessment and plan.  Meds ordered this encounter  Medications  . amLODipine (NORVASC) 5 MG tablet    Sig: Take 1 tablet (5 mg total) by mouth daily.    Dispense:  90 tablet    Refill:  3    Cancel the refills of the 10 mg; we're lowering the dose    Orders Placed This Encounter  Procedures  . Pneumococcal conjugate vaccine 13-valent IM

## 2016-04-18 NOTE — Assessment & Plan Note (Signed)
Avoid NSAIDs; encouraged adequate hydration

## 2016-04-18 NOTE — Assessment & Plan Note (Signed)
Excellent control; try the DASH guidelines

## 2016-04-18 NOTE — Patient Instructions (Addendum)
Your goal blood pressure is less than 150 mmHg on top. Try to follow the DASH guidelines (DASH stands for Dietary Approaches to Stop Hypertension) Try to limit the sodium in your diet.  Ideally, consume less than 1.5 grams (less than 1,500mg ) per day. Do not add salt when cooking or at the table.  Check the sodium amount on labels when shopping, and choose items lower in sodium when given a choice. Avoid or limit foods that already contain a lot of sodium. Eat a diet rich in fruits and vegetables and whole grains.  Try to limit saturated fats in your diet (bologna, hot dogs, barbeque, cheeseburgers, hamburgers, steak, bacon, sausage, cheese, etc.) and get more fresh fruits, vegetables, and whole grains  I do recommend yearly flu shots; for individuals who don't want flu shots, try to practice excellent hand hygiene, and avoid nursing homes, day cares, and hospitals during peak flu season; taking additional vitamin C daily during flu/cold season may help boost your immune system too  Please do see your eye doctor regularly, and have your eyes examined every year (or more often per his or her recommendation) Check your feet every night and let me know right away of any sores, infections, numbness, etc. Try to limit sweets, white bread, white rice, white potatoes It is okay with me for you to not check your fingerstick blood sugars (per [employer] of Endocrinology Best Practices), unless you are interested and feel it would be helpful for you  Reduce the dose of the amlodipine from 10 mg daily to 5 mg daily; return to see the CMA in 2-3 weeks for a blood pressure recheck

## 2016-04-18 NOTE — Assessment & Plan Note (Signed)
She may have had diabetes years ago, but her A1c last time was excellent, not even prediabetic range

## 2016-04-19 ENCOUNTER — Other Ambulatory Visit: Payer: Self-pay | Admitting: Family Medicine

## 2016-04-19 DIAGNOSIS — D709 Neutropenia, unspecified: Secondary | ICD-10-CM

## 2016-04-19 NOTE — Progress Notes (Signed)
Ordered recheck CBC for March

## 2016-04-19 NOTE — Assessment & Plan Note (Signed)
Slightly improved, recheck in 3 months

## 2016-07-05 ENCOUNTER — Other Ambulatory Visit: Payer: Self-pay

## 2016-07-05 MED ORDER — LORATADINE 10 MG PO TABS: 10.0000 mg | ORAL_TABLET | Freq: Every day | ORAL | 0 refills | Status: DC | PRN

## 2016-07-05 NOTE — Telephone Encounter (Signed)
Patient requesting refill of Loratadine to [facility]

## 2016-08-14 ENCOUNTER — Other Ambulatory Visit: Payer: Self-pay | Admitting: Family Medicine

## 2016-08-20 ENCOUNTER — Ambulatory Visit: Payer: Medicare Other | Admitting: Nurse Practitioner

## 2016-08-21 ENCOUNTER — Encounter: Payer: Self-pay | Admitting: Nurse Practitioner

## 2016-10-18 ENCOUNTER — Ambulatory Visit: Payer: Medicare Other | Admitting: Family Medicine

## 2016-11-02 ENCOUNTER — Other Ambulatory Visit: Payer: Self-pay | Admitting: Nurse Practitioner

## 2016-11-12 ENCOUNTER — Other Ambulatory Visit: Payer: Self-pay | Admitting: Family Medicine

## 2016-11-12 NOTE — Telephone Encounter (Signed)
Patient no showed for her last visit Please ask her to reschedule, we'll get labs at that visit please I'll send in short-term supply of medicine Thank you

## 2016-11-12 NOTE — Telephone Encounter (Signed)
Tried calling patient, no answer,no option to leave a message.

## 2016-11-19 ENCOUNTER — Telehealth: Payer: Self-pay

## 2016-11-19 ENCOUNTER — Ambulatory Visit (INDEPENDENT_AMBULATORY_CARE_PROVIDER_SITE_OTHER): Payer: Medicare Other | Admitting: Family Medicine

## 2016-11-19 ENCOUNTER — Encounter: Payer: Self-pay | Admitting: Family Medicine

## 2016-11-19 ENCOUNTER — Other Ambulatory Visit: Payer: Self-pay | Admitting: Family Medicine

## 2016-11-19 VITALS — BP 128/76 | HR 60 | Temp 97.7°F | Resp 14 | Wt 157.5 lb

## 2016-11-19 DIAGNOSIS — R110 Nausea: Secondary | ICD-10-CM

## 2016-11-19 DIAGNOSIS — G8929 Other chronic pain: Secondary | ICD-10-CM

## 2016-11-19 DIAGNOSIS — Z13820 Encounter for screening for osteoporosis: Secondary | ICD-10-CM

## 2016-11-19 DIAGNOSIS — I6523 Occlusion and stenosis of bilateral carotid arteries: Secondary | ICD-10-CM

## 2016-11-19 DIAGNOSIS — D709 Neutropenia, unspecified: Secondary | ICD-10-CM

## 2016-11-19 DIAGNOSIS — R634 Abnormal weight loss: Secondary | ICD-10-CM | POA: Diagnosis not present

## 2016-11-19 DIAGNOSIS — Z780 Asymptomatic menopausal state: Secondary | ICD-10-CM

## 2016-11-19 DIAGNOSIS — Z1231 Encounter for screening mammogram for malignant neoplasm of breast: Secondary | ICD-10-CM

## 2016-11-19 DIAGNOSIS — Z5181 Encounter for therapeutic drug level monitoring: Secondary | ICD-10-CM | POA: Diagnosis not present

## 2016-11-19 DIAGNOSIS — E119 Type 2 diabetes mellitus without complications: Secondary | ICD-10-CM

## 2016-11-19 DIAGNOSIS — R140 Abdominal distension (gaseous): Secondary | ICD-10-CM

## 2016-11-19 DIAGNOSIS — F418 Other specified anxiety disorders: Secondary | ICD-10-CM

## 2016-11-19 DIAGNOSIS — R569 Unspecified convulsions: Secondary | ICD-10-CM | POA: Diagnosis not present

## 2016-11-19 DIAGNOSIS — M1A3720 Chronic gout due to renal impairment, left ankle and foot, without tophus (tophi): Secondary | ICD-10-CM

## 2016-11-19 DIAGNOSIS — M25511 Pain in right shoulder: Secondary | ICD-10-CM

## 2016-11-19 DIAGNOSIS — Z1211 Encounter for screening for malignant neoplasm of colon: Secondary | ICD-10-CM

## 2016-11-19 DIAGNOSIS — I10 Essential (primary) hypertension: Secondary | ICD-10-CM | POA: Diagnosis not present

## 2016-11-19 DIAGNOSIS — H04123 Dry eye syndrome of bilateral lacrimal glands: Secondary | ICD-10-CM

## 2016-11-19 DIAGNOSIS — N183 Chronic kidney disease, stage 3 unspecified: Secondary | ICD-10-CM

## 2016-11-19 DIAGNOSIS — Z1239 Encounter for other screening for malignant neoplasm of breast: Secondary | ICD-10-CM

## 2016-11-19 LAB — COMPLETE METABOLIC PANEL WITH GFR
ALBUMIN: 4.2 g/dL (ref 3.6–5.1)
ALK PHOS: 64 U/L (ref 33–130)
ALT: 8 U/L (ref 6–29)
AST: 12 U/L (ref 10–35)
BILIRUBIN TOTAL: 0.5 mg/dL (ref 0.2–1.2)
BUN: 19 mg/dL (ref 7–25)
CO2: 24 mmol/L (ref 20–31)
Calcium: 9.8 mg/dL (ref 8.6–10.4)
Chloride: 105 mmol/L (ref 98–110)
Creat: 1.23 mg/dL — ABNORMAL HIGH (ref 0.50–0.99)
GFR, EST AFRICAN AMERICAN: 52 mL/min — AB (ref >=60)
GFR, EST NON AFRICAN AMERICAN: 45 mL/min — AB (ref >=60)
GLUCOSE: 82 mg/dL (ref 65–99)
Potassium: 3.9 mmol/L (ref 3.5–5.3)
SODIUM: 141 mmol/L (ref 135–146)
TOTAL PROTEIN: 7.3 g/dL (ref 6.1–8.1)

## 2016-11-19 LAB — LIPID PANEL
CHOL/HDL RATIO: 1.9 ratio (ref <5.0)
CHOLESTEROL: 143 mg/dL (ref <200)
HDL: 74 mg/dL (ref >50)
LDL CALC: 60 mg/dL (ref <100)
Triglycerides: 45 mg/dL (ref <150)
VLDL: 9 mg/dL (ref <30)

## 2016-11-19 LAB — TSH: TSH: 0.63 mIU/L

## 2016-11-19 MED ORDER — LOVASTATIN 40 MG PO TABS: 40.0000 mg | ORAL_TABLET | Freq: Every day | ORAL | 1 refills | Status: DC

## 2016-11-19 MED ORDER — CITALOPRAM HYDROBROMIDE 10 MG PO TABS: 10.0000 mg | ORAL_TABLET | Freq: Every day | ORAL | 1 refills | Status: DC

## 2016-11-19 MED ORDER — AMLODIPINE BESYLATE 5 MG PO TABS: 5.0000 mg | ORAL_TABLET | Freq: Every day | ORAL | 3 refills | Status: DC

## 2016-11-19 NOTE — Assessment & Plan Note (Signed)
Check labs 

## 2016-11-19 NOTE — Progress Notes (Signed)
BP 128/76   Pulse 60   Temp 97.7 F (36.5 C) (Oral)   Resp 14   Wt 157 lb 8 oz (71.4 kg)   SpO2 99%   BMI 29.76 kg/m    Subjective:    Patient ID: Leslie Houston, female    DOB: 16-Jun-1950, 66 y.o.   MRN: [id number]  HPI: Leslie Houston is a 66 y.o. female  Chief Complaint  Patient presents with  . Medication Refill  . Dry Eye  . Temporomandibular Joint Pain    cannot take tylenol, the told her not to due to WBC?  . Nausea    in morning    HPI Patient is here for f/u Nauseated for 10 days or so, watery mouth; no vomiting; no belly pain; upset stomach, some abd bloating Just losing weight; was eating healthy, but slacked up lately; stomach not taking it No fevers She is having some dry eyes, feels gravely Not taking  Having pain in her shoulders; knees are okay; feet swell up and can't hardly walk sometimes Feels warm and has gout Low white cell count CKD; not taking aleve or nsaids, thought not to take tylenol High cholesterol; on statin; trying to stay away from bacon, sausage High blood pressure; on amlodipine Last seizures; had a small one last month, June 2018; got upset from family issue; "little small one"; sees neurologist Type 2 diabetes; dry eyes and dry mouth; nocturia just sometimes; not checking sugars  Depression screen [facility] 2/9 11/19/2016 04/18/2016 12/30/2015  Decreased Interest 0 0 0  Down, Depressed, Hopeless 0 0 0  PHQ - 2 Score 0 0 0    Relevant past medical, surgical, family and social history reviewed Past Medical History:  Diagnosis Date  . CAD (coronary artery disease)    a. Pt reports in 2000 she had a stent to a coronary artery, details unclear, outside hospital.  . Carotid stenosis    a. CT angio head 07/2014 - 50-60% stenoses of prox ICA bilaterally.  . CHF (congestive heart failure) ([place])    a. Pt reports in 2000 she had CHF, details unclear, outside hospital.  . CKD (chronic kidney disease), stage III   . Diabetes mellitus  ([place])   . Gout   . Hypertension   . Orthostatic hypotension   . PUD (peptic ulcer disease)   . PVC's (premature ventricular contractions)   . Seizures ([place])   . Sinus bradycardia   . Stroke [facility])    a. L PCA CVA in 05/2014.  [name] Syncope    Past Surgical History:  Procedure Laterality Date  . CESAREAN SECTION    . DENTAL SURGERY    . Heart stent    . stint     Family History  Problem Relation Age of Onset  . Stroke Mother   . Heart attack Mother   . Lung cancer Mother   . Stroke Father   . Prostate cancer Father   . Colon cancer Father   . Diabetes Mellitus II Daughter   . Multiple sclerosis Daughter   . Kidney disease Son   . Cancer Paternal Grandfather    Social History   Social History  . Marital status: Single    Spouse name: N/A  . Number of children: 2  . Years of education: 12   Occupational History  . Disabled    Social History Main Topics  . Smoking status: Former Smoker    Quit date: 03/30/2014  . Smokeless tobacco: Never Used  .  Alcohol use No  . Drug use: No  . Sexual activity: Not Currently   Other Topics Concern  . Not on file   Social History Narrative   Lives at home with her son.   Right-handed.   1 cup coffee per day.    Interim medical history since last visit reviewed. Allergies and medications reviewed  Review of Systems Per HPI unless specifically indicated above     Objective:    BP 128/76   Pulse 60   Temp 97.7 F (36.5 C) (Oral)   Resp 14   Wt 157 lb 8 oz (71.4 kg)   SpO2 99%   BMI 29.76 kg/m   Wt Readings from Last 3 Encounters:  11/19/16 157 lb 8 oz (71.4 kg)  04/18/16 163 lb (73.9 kg)  02/20/16 162 lb (73.5 kg)    Physical Exam  Constitutional: She appears well-developed and well-nourished. No distress.  Weight loss 5.5 pounds over last 7 months  HENT:  Head: Normocephalic and atraumatic.  Eyes: EOM are normal. No scleral icterus.  Neck: No thyromegaly present.  Cardiovascular: Normal rate, regular rhythm  and normal heart sounds.   No murmur heard. Pulmonary/Chest: Effort normal and breath sounds normal. No respiratory distress. She has no wheezes.  Abdominal: Soft. Bowel sounds are normal. She exhibits no distension.  Musculoskeletal: Normal range of motion. She exhibits no edema.  Neurological: She is alert. She exhibits normal muscle tone.  Skin: Skin is warm and dry. She is not diaphoretic. No pallor.  Psychiatric: She has a normal mood and affect.  Fair historian   Diabetic Foot Form - Detailed   Diabetic Foot Exam - detailed Diabetic Foot exam was performed with the following findings:  Yes 11/19/2016  4:24 PM  Visual Foot Exam completed.:  Yes  Pulse Foot Exam completed.:  Yes  Right Dorsalis Pedis:  Present Left Dorsalis Pedis:  Present  Sensory Foot Exam Completed.:  Yes Semmes-Weinstein Monofilament Test R Site 1-Great Toe:  Pos L Site 1-Great Toe:  Pos        Results for orders placed or performed in visit on 93/90/30  BASIC METABOLIC PANEL WITH GFR  Result Value Ref Range   Sodium 140 135 - 146 mmol/L   Potassium 4.6 3.5 - 5.3 mmol/L   Chloride 106 98 - 110 mmol/L   CO2 26 20 - 31 mmol/L   Glucose, Bld 71 65 - 99 mg/dL   BUN 17 7 - 25 mg/dL   Creat 1.28 (H) 0.50 - 0.99 mg/dL   Calcium 10.0 8.6 - 10.4 mg/dL   GFR, Est African American 50 (L) >=60 mL/min   GFR, Est Non African American 44 (L) >=60 mL/min  CBC with Differential/Platelet  Result Value Ref Range   WBC 3.0 (L) 3.8 - 10.8 K/uL   RBC 4.22 3.80 - 5.10 MIL/uL   Hemoglobin 13.5 11.7 - 15.5 g/dL   HCT 40.2 35.0 - 45.0 %   MCV 95.3 80.0 - 100.0 fL   MCH 32.0 27.0 - 33.0 pg   MCHC 33.6 32.0 - 36.0 g/dL   RDW 12.3 11.0 - 15.0 %   Platelets 215 140 - 400 K/uL   MPV 9.9 7.5 - 12.5 fL   Neutro Abs 1,200 (L) 1,500 - 7,800 cells/uL   Lymphs Abs 1,440 850 - 3,900 cells/uL   Monocytes Absolute 270 200 - 950 cells/uL   Eosinophils Absolute 60 15 - 500 cells/uL   Basophils Absolute 30 0 - 200 cells/uL  Neutrophils Relative % 40 %   Lymphocytes Relative 48 %   Monocytes Relative 9 %   Eosinophils Relative 2 %   Basophils Relative 1 %   Smear Review Criteria for review not met       Assessment & Plan:   Problem List Items Addressed This Visit      Cardiovascular and Mediastinum   Essential hypertension    Well-controlled today; try to limit salt      Relevant Medications   lovastatin (MEVACOR) 40 MG tablet   amLODipine (NORVASC) 5 MG tablet   Carotid stenosis    Sees vascular doctor      Relevant Medications   lovastatin (MEVACOR) 40 MG tablet   amLODipine (NORVASC) 5 MG tablet     Endocrine   Diabetes mellitus ([place])    Foot exam by MD; check A1c and glucose      Relevant Medications   lovastatin (MEVACOR) 40 MG tablet   Other Relevant Orders   Hemoglobin A1c   Lipid panel   Microalbumin / creatinine urine ratio     Genitourinary   CKD (chronic kidney disease) stage 3, GFR 30-59 ml/min    Avoid NSAIDs      Relevant Orders   COMPLETE METABOLIC PANEL WITH GFR     Other   Seizures ([place])    Managed by neurologist      Neutropenia ([place])    Check CBC today      Encounter for medication monitoring    Check labs      Relevant Orders   COMPLETE METABOLIC PANEL WITH GFR    Other Visit Diagnoses    Chronic right shoulder pain    -  Primary   refer to orthopaedist   Relevant Medications   citalopram (CELEXA) 10 MG tablet   Other Relevant Orders   Ambulatory referral to Orthopedic Surgery   Screening for osteoporosis       Screening for breast cancer       Relevant Orders   MM Digital Screening   Screen for colon cancer       Relevant Orders   Ambulatory referral to Gastroenterology   Postmenopausal status       Relevant Orders   DG Bone Density   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  An after-visit summary was printed and given to the patient at [place].  Please see the patient instructions which may contain other information and recommendations beyond what is mentioned above in the assessment and plan.  Meds ordered this encounter  Medications  . lovastatin (MEVACOR) 40 MG tablet    Sig: Take 1 tablet (40 mg total) by mouth at bedtime.  Dispense:  90 tablet    Refill:  1  . citalopram (CELEXA) 10 MG tablet    Sig: Take 1 tablet (10 mg total) by mouth daily.    Dispense:  90 tablet    Refill:  1  . amLODipine (NORVASC) 5 MG tablet    Sig: Take 1 tablet (5 mg total) by mouth daily.    Dispense:  90 tablet    Refill:  3    Cancel the refills of the 10 mg; we're lowering the dose    Orders Placed This Encounter  Procedures  . MM Digital Screening  . DG Bone Density  . US Transvaginal Non-OB  . US Pelvis Complete  . Uric acid  . COMPLETE METABOLIC PANEL WITH GFR  . Hemoglobin A1c  . Lipid panel  . Microalbumin / creatinine urine ratio  . TSH  . Ambulatory referral to Gastroenterology  . Ambulatory referral to Orthopedic Surgery  . Ambulatory referral to Ophthalmology

## 2016-11-19 NOTE — Patient Instructions (Addendum)
If you need something for aches or pains, try to use Tylenol (acetaminophen) instead of non-steroidals (which include Aleve, ibuprofen, Advil, Motrin, and naproxen); non-steroidals can cause long-term kidney damage Always take the Tylenol per package directions We'll check out your ovaries We'll get labs today We'll have you see the eye doctor and the shoulder doctor If you have not heard anything from my staff in a week about any orders/referrals/studies from today, please contact us here to follow-up [phone] Please do see your eye doctor regularly, and have your eyes examined every year (or more often per his or her recommendation) Check your feet every night and let me know right away of any sores, infections, numbness, etc. Try to limit sweets, white bread, white rice, white potatoes It is okay with me for you to not check your fingerstick blood sugars (per [employer] of Endocrinology Best Practices), unless you are interested and feel it would be helpful for you

## 2016-11-19 NOTE — Assessment & Plan Note (Signed)
Managed by neurologist

## 2016-11-19 NOTE — Telephone Encounter (Signed)
I called this patient to inform her that she has been scheduled to have her [place] on Thursday, November 22, 2016 at 2pm at [facility], but there was no answer. I was not able to leave a message.  Patient is to start drinking water 32oz 1 hour prior to her imaging and to be finished within 30 minutes without using the restroom.

## 2016-11-19 NOTE — Assessment & Plan Note (Signed)
Check CBC today.  

## 2016-11-19 NOTE — Assessment & Plan Note (Signed)
Avoid NSAIDs 

## 2016-11-19 NOTE — Assessment & Plan Note (Signed)
Sees vascular doctor

## 2016-11-19 NOTE — Assessment & Plan Note (Signed)
Foot exam by MD; check A1c and glucose

## 2016-11-19 NOTE — Assessment & Plan Note (Signed)
Well-controlled today; try to limit salt

## 2016-11-20 LAB — MICROALBUMIN / CREATININE URINE RATIO
Creatinine, Urine: 85 mg/dL (ref 20–320)
MICROALB UR: 0.4 mg/dL
Microalb Creat Ratio: 5 mcg/mg creat (ref <30)

## 2016-11-20 LAB — HEMOGLOBIN A1C
Hgb A1c MFr Bld: 4.8 % (ref <5.7)
Mean Plasma Glucose: 91 mg/dL

## 2016-11-20 LAB — URIC ACID: URIC ACID, SERUM: 4.5 mg/dL (ref 2.5–7.0)

## 2016-11-22 ENCOUNTER — Ambulatory Visit: Payer: Medicare Other

## 2016-11-22 ENCOUNTER — Telehealth: Payer: Self-pay | Admitting: Family Medicine

## 2016-11-22 NOTE — Telephone Encounter (Signed)
Called and gave pt the number to reschedule the appt. [phone].

## 2016-11-22 NOTE — Telephone Encounter (Signed)
Yes, please contact her; she depends on others for transportation

## 2016-11-22 NOTE — Telephone Encounter (Signed)
Kim from [facility] at [facility] wanted to inform you that the patient did not show for her appointment today. She is not certain if you wanted to contact the patient so that she can reschedule

## 2016-11-26 DIAGNOSIS — H2513 Age-related nuclear cataract, bilateral: Secondary | ICD-10-CM | POA: Diagnosis not present

## 2016-11-26 LAB — HM DIABETES EYE EXAM

## 2016-11-27 NOTE — Progress Notes (Signed)
[facility]  PATIENT: Leslie Houston DOB: [birth year]   REASON FOR VISIT: follow-up for history of stroke and recent seizure events HISTORY FROM:patient and caregiver    HISTORY OF PRESENT ILLNESS:Leslie Houston is a 66 yo RH female with her care giver Leslie Houston, her PCP is 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, require prolonged ICU stay, mild mental delay, went to tenth grade, worked at the tobacco farm, never drive a car, she also had a history of coronary artery disease, cardiac stent, hypertension, hyperlipidemia, She had a history of epilepsy, generalized tonic-clonic seizure when she was young, since her most recent left occipital stroke early 2016, she began to have frequent spells, smelling dust smell, eyes crossed, transient confusion , she sometimes fell to the ground, lasting less than a minute, she can have few spells in one day.She presented to hospital multiple times for similar presentation, I have reviewed MRI of brain in May 2016, large left occipital stroke, mild small vessel disease at supratentorium.  Laboratory showed normal BMP, with exception of creatinine 1.3, mild low WBC 3.8 Echocardiogram April 2016, normal ejection fraction 55-60%, no significant structural abnormality,  She is now wearing a cardiac monitoring,   UPDATE June 28th 2016:YY She is with her care giver [name], She only had one episode of seizure like event, sudden onset of confusion, instead of daily similar episode since she started taking Keppra 500 mg twice a day, she has frequent almost daily headaches, We have reviewed EEG result, that was normal in June 2016 I have reviewed MRI of brain scan with patient, chronic left occipital stroke, supratentorium small vessel disease, no acute lesions  UPDATE Jan 31 2015:YY She could not tolerate Topamax, made her feel different, had diarrhea, she is back on Keppr  500mg  bid, she has two spells since June 2016,  She was taken to the hospital in June 14 and July 18 for seizure-like event, she is a little bit confused about her medications, she is tolerating Keppra much better  UPDATE April 19th 2017CM. Leslie Houston, 66 year old female returns for follow-up. She is previously followed by Dr. Krista Blue.She was placed on extended release Keppra after her last visit however she only took the medication for a couple of days and says she called in to the office but I do not see a phone note.She resumed 500 twice daily. Her mother died 04-05-2023 and she had 2 seizures around that time and no further seizure events. She does not drive.She has not had further stroke or TIA symptoms. She has not had any falls. She returns for reevaluation UPDATE 02/20/2016 CMMs. Holberg, 66 year old female returns for follow-up with her caregiver. She has history of stroke and seizure disorder. She is currently on Keppra 500 twice daily she reports 2 episodes of seizure events since last seen. Most recent Keppra level was 16.3 in  April. She has not had further stroke or TIA symptoms and is currently on aspirin with minimal bruising and no bleeding. She gets little exercise but does walk. She denies any recent falls. She returns for reevaluation UPDATE 08/01/2018CM Leslie Houston, 66 year old female returns for follow-up with her friend. She has a history of stroke and seizure disorder. She remains on aspirin for secondary stroke prevention without further stroke or TIA symptoms. She has minimal bruising and bleeding. She is also on lovastatin without complaints of myalgias. Blood pressure in the office today  140/80. Reviewed labs from 11/19/2016. Hemoglobin A1c 4.8. LDL 60. She remains on Keppra and no seizure events. She gets little exercise no recent falls. She returns for reevaluation   REVIEW OF SYSTEMS: Full 14 system review of systems performed and notable only for those listed, all others are neg:    Constitutional: neg  Cardiovascular: neg Ear/Nose/Throat: neg  Skin: neg Eyes: Blurred vision Respiratory: neg Gastroitestinal: neg  Hematology/Lymphatic: neg  Endocrine: neg Musculoskeletal: Joint pain Allergy/Immunology: neg Neurological: Seizure disorder Psychiatric: Depression Sleep : neg   ALLERGIES: No Known Allergies  HOME MEDICATIONS: Outpatient Medications Prior to Visit  Medication Sig Dispense Refill  . amLODipine (NORVASC) 5 MG tablet Take 1 tablet (5 mg total) by mouth daily. 90 tablet 3  . ASPIR-LOW 81 MG EC tablet TAKE 1 TABLET BY MOUTH DAILY 30 tablet 11  . citalopram (CELEXA) 10 MG tablet Take 1 tablet (10 mg total) by mouth daily. 90 tablet 1  . fluticasone (FLONASE) 50 MCG/ACT nasal spray Place 2 sprays into both nostrils daily as needed for allergies (allergies).     Leslie Houston levETIRAcetam (KEPPRA) 500 MG tablet TAKE 1 TABLET BY MOUTH IN THE AM AND 2 IN THE PM 90 tablet 5  . loratadine (CLARITIN) 10 MG tablet TAKE 1 TABLET BY MOUTH DAILY AS NEEDED FOR ALLERGIES. 90 tablet 3  . lovastatin (MEVACOR) 40 MG tablet Take 1 tablet (40 mg total) by mouth at bedtime. 90 tablet 1   No facility-administered medications prior to visit.     PAST MEDICAL HISTORY: Past Medical History:  Diagnosis Date  . CAD (coronary artery disease)    a. Pt reports in 2000 she had a stent to a coronary artery, details unclear, outside hospital.  . Carotid stenosis    a. CT angio head 07/2014 - 50-60% stenoses of prox ICA bilaterally.  . CHF (congestive heart failure) ([place])    a. Pt reports in 2000 she had CHF, details unclear, outside hospital.  . CKD (chronic kidney disease), stage III   . Diabetes mellitus ([place])   . Gout   . Hypertension   . Orthostatic hypotension   . PUD (peptic ulcer disease)   . PVC's (premature ventricular contractions)   . Seizures ([place])   . Sinus bradycardia   . Stroke [facility])    a. L PCA CVA in 05/2014.  Leslie Houston Syncope     PAST SURGICAL HISTORY: Past  Surgical History:  Procedure Laterality Date  . CESAREAN SECTION    . DENTAL SURGERY    . Heart stent    . stint      FAMILY HISTORY: Family History  Problem Relation Age of Onset  . Stroke Mother   . Heart attack Mother   . Lung cancer Mother   . Stroke Father   . Prostate cancer Father   . Colon cancer Father   . Diabetes Mellitus II Daughter   . Multiple sclerosis Daughter   . Kidney disease Son   . Heart failure Son   . Cancer Paternal Grandfather     SOCIAL HISTORY: Social History   Social History  . Marital status: Single    Spouse name: N/A  . Number of children: 2  . Years of education: 12   Occupational History  . Disabled    Social History Main Topics  . Smoking status: Former Smoker    Quit date: 03/30/2014  . Smokeless tobacco: Never Used  . Alcohol use No  . Drug use: No  . Sexual  activity: Not Currently   Other Topics Concern  . Not on file   Social History Narrative   Lives at home with her son.   Right-handed.   1 cup coffee per day.     PHYSICAL EXAM  Vitals:   11/28/16 1418  BP: (!) 140/80   Pulse: 60  Weight: 154 lb (69.9 kg)   Body mass index is 29.1 kg/m.  Generalized: Well developed, in no acute distress  Head: normocephalic and atraumatic,. Oropharynx benign  Neck: Supple, no carotid bruits  Cardiac: Regular rate rhythm, no murmur  Musculoskeletal: No deformity   Neurological examination   Mentation: Alert oriented to time, place, history taking. Attention span and concentration appropriate. Recent and remote memory intact.  Follows all commands speech and language fluent.   Cranial nerve II-XII: .Pupils were equal round reactive to light extraocular movements were full, visual field were full on confrontational test. Right field cut. Facial sensation and strength were normal. hearing was intact to finger rubbing bilaterally. Uvula tongue midline. head turning and shoulder shrug were normal and symmetric.Tongue  protrusion into cheek strength was normal. Motor: normal bulk and tone, full strength in the BUE, BLE, fine finger movements normal, no pronator drift. No focal weakness Sensory: normal and symmetric to light touch, pinprick, and  Vibration,  Coordination: finger-nose-finger, heel-to-shin bilaterally, no dysmetria Reflexes: 1+ upper lower and symmetric, plantar responses were flexor bilaterally. Gait and Station: Rising up from seated position without assistance, wide based, mildly unsteady gait no assistive device.  DIAGNOSTIC DATA (LABS, IMAGING, TESTING) - I reviewed patient records, labs, notes, testing and imaging myself where available.  Lab Results  Component Value Date   WBC 3.0 (L) 04/18/2016   HGB 13.5 04/18/2016   HCT 40.2 04/18/2016   MCV 95.3 04/18/2016   PLT 215 04/18/2016      Component Value Date/Time   NA 141 11/19/2016 1141   NA 142 08/19/2014 0822   K 3.9 11/19/2016 1141   K 4.1 08/19/2014 0822   CL 105 11/19/2016 1141   CL 109 08/19/2014 0822   CO2 24 11/19/2016 1141   CO2 26 08/19/2014 0822   GLUCOSE 82 11/19/2016 1141   GLUCOSE 110 (H) 08/19/2014 0822   BUN 19 11/19/2016 1141   BUN 16 08/19/2014 0822   CREATININE 1.23 (H) 11/19/2016 1141   CALCIUM 9.8 11/19/2016 1141   CALCIUM 10.2 08/19/2014 0822   PROT 7.3 11/19/2016 1141   PROT 7.3 07/24/2014 0915   ALBUMIN 4.2 11/19/2016 1141   ALBUMIN 4.1 07/24/2014 0915   AST 12 11/19/2016 1141   AST 31 07/24/2014 0915   ALT 8 11/19/2016 1141   ALT 29 07/24/2014 0915   ALKPHOS 64 11/19/2016 1141   ALKPHOS 53 07/24/2014 0915   BILITOT 0.5 11/19/2016 1141   BILITOT 0.9 07/24/2014 0915   GFRNONAA 45 (L) 11/19/2016 1141   GFRAA 52 (L) 11/19/2016 1141     ASSESSMENT AND PLAN  66 y.o. year old female  has a past medical history of Stroke [facility]); migraine headaches and complex partial seizure disorder.   PLAN;Continue   Keppra to 500 mg every morning and 2 caps at night will refill Call for seizure  activity continue aspirin for secondary stroke prevention Keep systolic blood pressure 952/84 or less, today's reading 140/80 continue blood pressure medications Continue lovastatin most recent LDL on 11/19/2016 with 60 Follow-up in 1 year, next with Dr. Krista Blue I spent 25 min in total face to face time with the patient  more than 50% of which was spent counseling and coordination of care, reviewing test results reviewing medications and discussing and reviewing the diagnosis of stroke and management of risk factors and seizure disorder and further treatment options. , Rayburn Ma, [facility], APRN  [facility] [address] [place], [place] [address] [phone]

## 2016-11-28 ENCOUNTER — Ambulatory Visit (INDEPENDENT_AMBULATORY_CARE_PROVIDER_SITE_OTHER): Payer: Medicare Other | Admitting: Nurse Practitioner

## 2016-11-28 ENCOUNTER — Encounter: Payer: Self-pay | Admitting: Nurse Practitioner

## 2016-11-28 VITALS — BP 140/80 | HR 60 | Wt 154.0 lb

## 2016-11-28 DIAGNOSIS — Z8673 Personal history of transient ischemic attack (TIA), and cerebral infarction without residual deficits: Secondary | ICD-10-CM | POA: Diagnosis not present

## 2016-11-28 DIAGNOSIS — I6523 Occlusion and stenosis of bilateral carotid arteries: Secondary | ICD-10-CM

## 2016-11-28 DIAGNOSIS — R569 Unspecified convulsions: Secondary | ICD-10-CM

## 2016-11-28 DIAGNOSIS — I10 Essential (primary) hypertension: Secondary | ICD-10-CM | POA: Diagnosis not present

## 2016-11-28 NOTE — Patient Instructions (Signed)
Continue   Keppra to 500 mg every morning and 2 caps at night will refill Call for seizure activity continue aspirin for secondary stroke prevention Keep systolic blood pressure 153/79 or less, today's reading 140/80 continue blood pressure medications Continue lovastatin most recent LDL on 11/19/2016 with 60 Follow-up in 1 year, next with Dr. Krista Blue

## 2016-11-29 ENCOUNTER — Telehealth: Payer: Self-pay

## 2016-11-29 ENCOUNTER — Other Ambulatory Visit: Payer: Self-pay

## 2016-11-29 DIAGNOSIS — Z86010 Personal history of colonic polyps: Secondary | ICD-10-CM

## 2016-11-29 NOTE — Telephone Encounter (Signed)
Gastroenterology Pre-Procedure Review  Request Date:  Requesting Physician: Dr.   PATIENT REVIEW QUESTIONS: The patient responded to the following health history questions as indicated:    1. Are you having any GI issues? no 2. Do you have a personal history of Polyps?  3. Do you have a family history of Colon Cancer or Polyps? yes (Father, colon cancer) 4. Diabetes Mellitus? no 5. Joint replacements in the past 12 months?no 6. Major health problems in the past 3 months?no 7. Any artificial heart valves, MVP, or defibrillator?no    MEDICATIONS & ALLERGIES:    Patient reports the following regarding taking any anticoagulation/antiplatelet therapy:   Plavix, Coumadin, Eliquis, Xarelto, Lovenox, Pradaxa, Brilinta, or Effient? no Aspirin? yes (ASA 81mg )  Patient confirms/reports the following medications:  Current Outpatient Prescriptions  Medication Sig Dispense Refill  . amLODipine (NORVASC) 5 MG tablet Take 1 tablet (5 mg total) by mouth daily. 90 tablet 3  . ASPIR-LOW 81 MG EC tablet TAKE 1 TABLET BY MOUTH DAILY 30 tablet 11  . citalopram (CELEXA) 10 MG tablet Take 1 tablet (10 mg total) by mouth daily. 90 tablet 1  . fluticasone (FLONASE) 50 MCG/ACT nasal spray Place 2 sprays into both nostrils daily as needed for allergies (allergies).     [name] levETIRAcetam (KEPPRA) 500 MG tablet TAKE 1 TABLET BY MOUTH IN THE AM AND 2 IN THE PM 90 tablet 5  . loratadine (CLARITIN) 10 MG tablet TAKE 1 TABLET BY MOUTH DAILY AS NEEDED FOR ALLERGIES. 90 tablet 3  . lovastatin (MEVACOR) 40 MG tablet Take 1 tablet (40 mg total) by mouth at bedtime. 90 tablet 1  . UNABLE TO FIND Med Name: eye drops for dry eye     No current facility-administered medications for this visit.     Patient confirms/reports the following allergies:  No Known Allergies  No orders of the defined types were placed in this encounter.   AUTHORIZATION INFORMATION Primary Insurance: 1D#: Group #:  Secondary  Insurance: 1D#: Group #:  SCHEDULE INFORMATION: Date: 01/18/17 Time: Location: [place]

## 2016-11-29 NOTE — Progress Notes (Signed)
I have reviewed and agreed above plan. 

## 2017-01-18 ENCOUNTER — Ambulatory Visit: Admission: RE | Admit: 2017-01-18 | Payer: Medicare Other | Source: Ambulatory Visit | Admitting: Gastroenterology

## 2017-01-18 ENCOUNTER — Encounter: Admission: RE | Payer: Self-pay | Source: Ambulatory Visit

## 2017-01-18 SURGERY — COLONOSCOPY WITH PROPOFOL
Anesthesia: General

## 2017-03-20 ENCOUNTER — Telehealth: Payer: Self-pay | Admitting: Family Medicine

## 2017-03-20 ENCOUNTER — Other Ambulatory Visit: Payer: Self-pay

## 2017-03-20 DIAGNOSIS — D709 Neutropenia, unspecified: Secondary | ICD-10-CM

## 2017-03-20 NOTE — Telephone Encounter (Signed)
Pt notified, will come next week

## 2017-03-20 NOTE — Telephone Encounter (Signed)
Patient is overdue for having her CBC rechecked; please re-order and ask her to come by next week for that; thank you

## 2017-05-13 ENCOUNTER — Other Ambulatory Visit: Payer: Self-pay | Admitting: Family Medicine

## 2017-05-13 NOTE — Telephone Encounter (Signed)
January 23rd appointment Rx approved

## 2017-05-22 ENCOUNTER — Ambulatory Visit: Payer: Medicare Other | Admitting: Family Medicine

## 2017-06-16 NOTE — Telephone Encounter (Signed)
Patient has been seen Closing out old note

## 2017-07-24 ENCOUNTER — Telehealth: Payer: Self-pay | Admitting: Nurse Practitioner

## 2017-07-24 MED ORDER — CLONAZEPAM 1 MG PO TABS: 1.0000 mg | ORAL_TABLET | Freq: Two times a day (BID) | ORAL | 0 refills | Status: DC | PRN

## 2017-07-24 NOTE — Telephone Encounter (Signed)
Please call patient, I have called in clonazepam 1 mg up to twice a day as needed for anxiety,  Further management of her depression anxiety should be done by her primary care physician.

## 2017-07-24 NOTE — Telephone Encounter (Signed)
Pt has stated as the day of the funeral approaches she is getting more and more upset and would like to know if something could be called in for her nerves.  If so please use  [place] (Phone) [phone] (Fax)

## 2017-07-24 NOTE — Telephone Encounter (Signed)
Pt states that her son has passed and she would like to know if NP Hoyle Sauer would recommend her to go to the funeral since she had a light seizure this past Sunday and she has had strokes.  Pt states NP Hoyle Sauer has advised her to avoid events with crowds and excitement.  Please call

## 2017-07-24 NOTE — Telephone Encounter (Signed)
Spoke with patient and offered condolences. Informed her Dr Krista Blue sent in a prescription for clonazepam, take 1 tablet up to two times a day. Advised she wait a little while before going to pick up medicine as Rx was just sent in.  Advised if she needs further medication for anxiety, to call her PCP.  Patient is on Celexa.  She stated her son passed last Friday , and she had a "light seizure on Sunday". She verbalized the grief and distress she has felt. This RN again offered sympathies to her. She verbalized understanding of call.

## 2017-07-24 NOTE — Addendum Note (Signed)
Addended by: Marcial Pacas on: 07/24/2017 02:37 PM   Modules accepted: Orders

## 2017-08-13 ENCOUNTER — Telehealth: Payer: Self-pay

## 2017-08-13 NOTE — Telephone Encounter (Signed)
Copied from [place] [id number]. Topic: Inquiry >> Aug 13, 2017 11:28 AM Margot Ables wrote: Reason for CRM: Patient is a resident and they are needing pts disability status. She will fax a corrected form to [phone].

## 2017-08-13 NOTE — Telephone Encounter (Signed)
I need a little more information Who called? Do we have a release? What is this place?

## 2017-08-14 NOTE — Telephone Encounter (Signed)
They will fax form.

## 2017-09-06 ENCOUNTER — Other Ambulatory Visit: Payer: Self-pay | Admitting: Nurse Practitioner

## 2017-09-26 ENCOUNTER — Other Ambulatory Visit: Payer: Self-pay | Admitting: Neurology

## 2017-10-23 ENCOUNTER — Other Ambulatory Visit: Payer: Self-pay | Admitting: Family Medicine

## 2017-10-23 NOTE — Telephone Encounter (Signed)
Patient has not been seen in almost a year; she was supposed to have been seen in January Please call her, have her schedule an appointment, first available with ME I'll refill medicine this one time and we look forward to seeing her

## 2017-10-23 NOTE — Telephone Encounter (Signed)
Spoke with Leslie Houston and informed him to have the patient to give the office a call.

## 2017-11-20 ENCOUNTER — Telehealth: Payer: Self-pay | Admitting: Family Medicine

## 2017-11-20 NOTE — Telephone Encounter (Signed)
Patient is calling and she scheduled a appointment on 12/03/17 to get her medication. She would like to know can she get enough medication to last her to her appointment. She states she has not been able to come to the doctor before her son has recently passed away and she has been going through a hard time. Please advise.

## 2017-11-20 NOTE — Telephone Encounter (Signed)
Please see the open phone note from June Review that and close out/sign after any additional action taken Patient needs an appointment

## 2017-11-20 NOTE — Telephone Encounter (Signed)
Called [phone] @ 4:04pm was not able to leave a message. Dr Sanda Klein is requesting that the pt be seen asap for any refills.

## 2017-11-21 MED ORDER — LOVASTATIN 40 MG PO TABS: 40.0000 mg | ORAL_TABLET | Freq: Every day | ORAL | 0 refills | Status: DC

## 2017-11-21 NOTE — Telephone Encounter (Signed)
Pt.notified

## 2017-11-21 NOTE — Telephone Encounter (Signed)
I sent a month of statin; we're sorry to hear of her loss and look forward to seeing her soon If she needs something else, let me know

## 2017-11-28 ENCOUNTER — Ambulatory Visit: Payer: Medicare Other | Admitting: Family Medicine

## 2017-12-02 ENCOUNTER — Encounter: Payer: Self-pay | Admitting: Neurology

## 2017-12-02 ENCOUNTER — Ambulatory Visit (INDEPENDENT_AMBULATORY_CARE_PROVIDER_SITE_OTHER): Payer: Medicare Other | Admitting: Neurology

## 2017-12-02 VITALS — BP 186/85 | HR 58 | Resp 20 | Wt 182.0 lb

## 2017-12-02 DIAGNOSIS — R569 Unspecified convulsions: Secondary | ICD-10-CM

## 2017-12-02 MED ORDER — LEVETIRACETAM 500 MG PO TABS: ORAL_TABLET | ORAL | 4 refills | Status: DC

## 2017-12-02 NOTE — Progress Notes (Signed)
[facility]  PATIENT: Leslie Houston DOB: 11/18/1950   HISTORY OF PRESENT ILLNESS:Leslie Houston is a 67 yo RH female with her care giver [name], her PCP is [name].  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  She had a history of epilepsy, generalized tonic-clonic seizure when she was young, since her most recent left occipital stroke early 2016, she began to have frequent spells, smelling dust smell, eyes crossed, transient confusion , she sometimes fell to the ground, lasting less than a minute, she can have few spells in one day.She presented to hospital multiple times for similar presentation, I have reviewed MRI of brain in May 2016, large left occipital stroke, mild small vessel disease at supratentorium.  Laboratory showed normal BMP, with exception of creatinine 1.3, mild low WBC 3.8 Echocardiogram 09-01-14, normal ejection fraction 55-60%, no significant structural abnormality,  She is now wearing a cardiac monitoring,   UPDATE June 28th 2016:YY She is with her care giver [name], She only had one episode of seizure like event, sudden onset of confusion, instead of daily similar episode since she started taking Keppra 500 mg twice a day, she has frequent almost daily headaches, We have reviewed EEG result, that was normal in June 2016 I have reviewed MRI of brain scan with patient, chronic left occipital stroke, supratentorium small vessel disease, no acute lesions  UPDATE Jan 31 2015:YY She could not tolerate Topamax, made her feel different, had diarrhea, she is back on Keppr 500mg  bid, she has two spells since June 2016,  She was taken to the hospital in June 14 and July 18 for  seizure-like event, she is a little bit confused about her medications, she is tolerating Keppra much better  UPDATE December 02 2017: She is taking keppra 500 mg 1/2, she has two seizure since Jan 2019, she feel like that she is going to pass out, lying down keep still, she will be fine in few minutes.  One was in Jan 2019. 2nd one was when her son died in 08-31-17, she does not want changes her medication.    REVIEW OF SYSTEMS: Full 14 system review of systems performed and notable only for those listed, all others are neg:  Headache, seizure, joint pain, swelling, back pain, aching muscles, muscle cramps, walking difficulty, neck pain, stiffness, snoring, blurred vision, eye itching, redness, change, excessive sweating  ALLERGIES: No Known Allergies  HOME MEDICATIONS: Outpatient Medications Prior to Visit  Medication Sig Dispense Refill  . amLODipine (NORVASC) 5 MG tablet TAKE 1 TABLET BY MOUTH ONCE DAILY *NEW DOSE* 90 tablet 3  . ASPIR-LOW 81 MG EC tablet TAKE 1 TABLET BY MOUTH DAILY 30 tablet 11  . citalopram (CELEXA) 10 MG tablet Take 1 tablet (10 mg total) by mouth daily. 90 tablet 1  . clonazePAM (KLONOPIN) 1 MG tablet Take 1 tablet (1 mg total) by mouth 2 (two) times daily as needed for anxiety. 45 tablet 0  . fluticasone (FLONASE) 50 MCG/ACT nasal spray Place 2 sprays into both nostrils daily as needed for allergies (allergies).     [name] levETIRAcetam (KEPPRA) 500 MG tablet TAKE 1 TABLET BY MOUTH IN THE AM AND 2 IN THE PM 90 tablet 5  .  loratadine (CLARITIN) 10 MG tablet TAKE 1 TABLET BY MOUTH DAILY AS NEEDED FOR ALLERGIES. 90 tablet 3  . lovastatin (MEVACOR) 40 MG tablet Take 1 tablet (40 mg total) by mouth at bedtime. 30 tablet 0  . UNABLE TO FIND Med Name: eye drops for dry eye     No facility-administered medications prior to visit.     PAST MEDICAL HISTORY: Past Medical History:  Diagnosis Date  . CAD (coronary artery disease)    a. Pt reports in 2000 she had a stent to a  coronary artery, details unclear, outside hospital.  . Carotid stenosis    a. CT angio head 07/2014 - 50-60% stenoses of prox ICA bilaterally.  . CHF (congestive heart failure) ([place])    a. Pt reports in 2000 she had CHF, details unclear, outside hospital.  . CKD (chronic kidney disease), stage III ([address])   . Diabetes mellitus ([place])   . Gout   . Hypertension   . Orthostatic hypotension   . PUD (peptic ulcer disease)   . PVC's (premature ventricular contractions)   . Seizures ([place])   . Sinus bradycardia   . Stroke [facility])    a. L PCA CVA in 05/2014.  [name] Syncope     PAST SURGICAL HISTORY: Past Surgical History:  Procedure Laterality Date  . CESAREAN SECTION    . DENTAL SURGERY    . Heart stent    . stint      FAMILY HISTORY: Family History  Problem Relation Age of Onset  . Stroke Mother   . Heart attack Mother   . Lung cancer Mother   . Stroke Father   . Prostate cancer Father   . Colon cancer Father   . Diabetes Mellitus II Daughter   . Multiple sclerosis Daughter   . Kidney disease Son   . Heart failure Son   . Cancer Paternal Grandfather     SOCIAL HISTORY: Social History   Socioeconomic History  . Marital status: Single    Spouse name: Not on file  . Number of children: 2  . Years of education: 58  . Highest education level: Not on file  Occupational History  . Occupation: Disabled  Social Needs  . Financial resource strain: Not on file  . Food insecurity:    Worry: Not on file    Inability: Not on file  . Transportation needs:    Medical: Not on file    Non-medical: Not on file  Tobacco Use  . Smoking status: Former Smoker    Last attempt to quit: 03/30/2014    Years since quitting: 3.6  . Smokeless tobacco: Never Used  Substance and Sexual Activity  . Alcohol use: No  . Drug use: No  . Sexual activity: Not Currently  Lifestyle  . Physical activity:    Days per week: Not on file    Minutes per session: Not on file  . Stress: Not on file    Relationships  . Social connections:    Talks on phone: Not on file    Gets together: Not on file    Attends religious service: Not on file    Active member of club or organization: Not on file    Attends meetings of clubs or organizations: Not on file    Relationship status: Not on file  . Intimate partner violence:    Fear of current or ex partner: Not on file    Emotionally abused: Not on file    Physically abused:  Not on file    Forced sexual activity: Not on file  Other Topics Concern  . Not on file  Social History Narrative   Lives at home with her son.   Right-handed.   1 cup coffee per day.     PHYSICAL EXAM  Vitals:   11/28/16 1418  BP: (!) 140/80   Pulse: 60  Weight: 154 lb (69.9 kg)   Body mass index is 34.39 kg/m.  Generalized: Well developed, in no acute distress  Head: normocephalic and atraumatic,. Oropharynx benign  Neck: Supple, no carotid bruits  Cardiac: Regular rate rhythm, no murmur  Musculoskeletal: No deformity   Neurological examination   Mentation: Alert oriented to time, place, history taking. Attention span and concentration appropriate. Recent and remote memory intact.  Follows all commands speech and language fluent.   Cranial nerve II-XII: .Pupils were equal round reactive to light extraocular movements were full, visual field were full on confrontational test. Right field cut. Facial sensation and strength were normal. hearing was intact to finger rubbing bilaterally. Uvula tongue midline. head turning and shoulder shrug were normal and symmetric.Tongue protrusion into cheek strength was normal. Motor: normal bulk and tone, full strength in the BUE, BLE, fine finger movements normal, no pronator drift. No focal weakness Sensory: normal and symmetric to light touch, pinprick, and  Vibration,  Coordination: finger-nose-finger, heel-to-shin bilaterally, no dysmetria Reflexes: 1+ upper lower and symmetric, plantar responses were flexor  bilaterally. Gait and Station: Rising up from seated position without assistance, wide based, mildly unsteady gait no assistive device.  DIAGNOSTIC DATA (LABS, IMAGING, TESTING) - I reviewed patient records, labs, notes, testing and imaging myself where available.  Lab Results  Component Value Date   WBC 3.0 (L) 04/18/2016   HGB 13.5 04/18/2016   HCT 40.2 04/18/2016   MCV 95.3 04/18/2016   PLT 215 04/18/2016      Component Value Date/Time   NA 141 11/19/2016 1141   NA 142 08/19/2014 0822   K 3.9 11/19/2016 1141   K 4.1 08/19/2014 0822   CL 105 11/19/2016 1141   CL 109 08/19/2014 0822   CO2 24 11/19/2016 1141   CO2 26 08/19/2014 0822   GLUCOSE 82 11/19/2016 1141   GLUCOSE 110 (H) 08/19/2014 0822   BUN 19 11/19/2016 1141   BUN 16 08/19/2014 0822   CREATININE 1.23 (H) 11/19/2016 1141   CALCIUM 9.8 11/19/2016 1141   CALCIUM 10.2 08/19/2014 0822   PROT 7.3 11/19/2016 1141   PROT 7.3 07/24/2014 0915   ALBUMIN 4.2 11/19/2016 1141   ALBUMIN 4.1 07/24/2014 0915   AST 12 11/19/2016 1141   AST 31 07/24/2014 0915   ALT 8 11/19/2016 1141   ALT 29 07/24/2014 0915   ALKPHOS 64 11/19/2016 1141   ALKPHOS 53 07/24/2014 0915   BILITOT 0.5 11/19/2016 1141   BILITOT 0.9 07/24/2014 0915   GFRNONAA 45 (L) 11/19/2016 1141   GFRAA 52 (L) 11/19/2016 1141     ASSESSMENT AND PLAN  67 y.o. year old female  Complex partial seizure. Hx of stroke  Keep keppra 500mg  one in the morning, 2 tabs at night.  Keep asa daily.  Marcial Pacas, M.D. Ph.D.  [facility] [address], [place] [address] Phone: [phone] Fax:      [phone]

## 2017-12-03 ENCOUNTER — Encounter: Payer: Self-pay | Admitting: Family Medicine

## 2017-12-03 ENCOUNTER — Ambulatory Visit (INDEPENDENT_AMBULATORY_CARE_PROVIDER_SITE_OTHER): Payer: Medicare Other | Admitting: Family Medicine

## 2017-12-03 VITALS — BP 140/74 | HR 59 | Temp 97.6°F | Resp 14 | Ht 61.0 in | Wt 181.5 lb

## 2017-12-03 DIAGNOSIS — I10 Essential (primary) hypertension: Secondary | ICD-10-CM

## 2017-12-03 DIAGNOSIS — E2839 Other primary ovarian failure: Secondary | ICD-10-CM

## 2017-12-03 DIAGNOSIS — R635 Abnormal weight gain: Secondary | ICD-10-CM | POA: Diagnosis not present

## 2017-12-03 DIAGNOSIS — R569 Unspecified convulsions: Secondary | ICD-10-CM

## 2017-12-03 DIAGNOSIS — Z1211 Encounter for screening for malignant neoplasm of colon: Secondary | ICD-10-CM

## 2017-12-03 DIAGNOSIS — N183 Chronic kidney disease, stage 3 unspecified: Secondary | ICD-10-CM

## 2017-12-03 DIAGNOSIS — Z1231 Encounter for screening mammogram for malignant neoplasm of breast: Secondary | ICD-10-CM | POA: Diagnosis not present

## 2017-12-03 DIAGNOSIS — E119 Type 2 diabetes mellitus without complications: Secondary | ICD-10-CM | POA: Diagnosis not present

## 2017-12-03 DIAGNOSIS — F419 Anxiety disorder, unspecified: Secondary | ICD-10-CM | POA: Insufficient documentation

## 2017-12-03 DIAGNOSIS — Z598 Other problems related to housing and economic circumstances: Secondary | ICD-10-CM

## 2017-12-03 DIAGNOSIS — Z8673 Personal history of transient ischemic attack (TIA), and cerebral infarction without residual deficits: Secondary | ICD-10-CM

## 2017-12-03 DIAGNOSIS — D709 Neutropenia, unspecified: Secondary | ICD-10-CM

## 2017-12-03 DIAGNOSIS — Z5987 Material hardship: Secondary | ICD-10-CM

## 2017-12-03 DIAGNOSIS — Z5181 Encounter for therapeutic drug level monitoring: Secondary | ICD-10-CM

## 2017-12-03 DIAGNOSIS — Z1239 Encounter for other screening for malignant neoplasm of breast: Secondary | ICD-10-CM

## 2017-12-03 LAB — TSH: TSH: 0.69 mIU/L (ref 0.40–4.50)

## 2017-12-03 MED ORDER — CITALOPRAM HYDROBROMIDE 20 MG PO TABS: 20.0000 mg | ORAL_TABLET | Freq: Every day | ORAL | 3 refills | Status: DC

## 2017-12-03 MED ORDER — LOVASTATIN 40 MG PO TABS: 40.0000 mg | ORAL_TABLET | Freq: Every day | ORAL | 3 refills | Status: DC

## 2017-12-03 NOTE — Assessment & Plan Note (Addendum)
Adjust med, try DASH guidelines; note to nephrologist as to why she is not taking an ARB

## 2017-12-03 NOTE — Progress Notes (Signed)
BP 140/74   Pulse (!) 59   Temp 97.6 F (36.4 C) (Oral)   Resp 14   Ht 5\' 1"  (1.549 m)   Wt 181 lb 8 oz (82.3 kg)   SpO2 99%   BMI 34.29 kg/m    Subjective:    Patient ID: Leslie Houston, female    DOB: [birth year], 67 y.o.   MRN: [id number]  HPI: [name] is a 67 y.o. female  Chief Complaint  Patient presents with  . Follow-up    HPI  Patient is here with a caregiver; caregiver reports that she has inadequate resources, food is an issue, even though she is gaining weight  Hypertension rx amlodipine 5mg  daily. Neurologist appointment yesterday was hypertensive but states she was very nervous. 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  Patient is rx celexa 10mg  daily and klonopin 1mg  tab- took it for 2 days in February and hasn't taken any since. Has trouble sleeping- she gets to sleep fine but then wakes up and starts thinking about it and has trouble getting back to sleep. Goes to bed around 9pm and wakes up around 2-3am and has trouble getting back to sleep. Doesn't take any sleep aids. Hasn't talked to a counsel  Seizures Takes keppra 500mg  daily. saw Neurology yesterday. 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  Last seizure was in march She is only doing one in the morning and one at night, and she says that Dr. Krista Blue knows that  High cholesterol Last lipids were over a year ago; avoiding fatty meats Lab Results  Component Value Date   CHOL 143 11/19/2016   CHOL 148 12/30/2015   Lab Results  Component Value Date   HDL 74  11/19/2016   HDL 74 12/30/2015   Lab Results  Component Value Date   LDLCALC 60 11/19/2016   LDLCALC 63 12/30/2015   Lab Results  Component Value Date   TRIG 45 11/19/2016   TRIG 53 12/30/2015   Lab Results  Component Value Date   CHOLHDL 1.9 11/19/2016   CHOLHDL 2.0 12/30/2015   No results found for: LDLDIRECT  CKD stage 3 Avoiding NSAIDs, no decongestants; good water drinker; seeing nephrologist  Depression screen [facility] 2/9 12/03/2017 11/19/2016 04/18/2016 12/30/2015  Decreased Interest 0 0 0 0  Down, Depressed, Hopeless 0 0 0 0  PHQ - 2 Score 0 0 0 0  Altered sleeping 2 - - -  Tired, decreased energy 2 - - -  Change in appetite 0 - - -  Feeling bad or failure about yourself  0 - - -  Trouble concentrating 0 - - -  Moving slowly or fidgety/restless 0 - - -  Suicidal thoughts 0 - - -  PHQ-9 Score 4 - - -  Difficult doing work/chores Not difficult at all - - -    Relevant past medical, surgical, family and social history reviewed Past Medical History:  Diagnosis Date  . CAD (coronary artery disease)    a. Pt reports in 2000 she had a  stent to a coronary artery, details unclear, outside hospital.  . Carotid stenosis    a. CT angio head 07/2014 - 50-60% stenoses of prox ICA bilaterally.  . CHF (congestive heart failure) ([place])    a. Pt reports in 2000 she had CHF, details unclear, outside hospital.  . CKD (chronic kidney disease), stage III ([place])   . Diabetes mellitus ([place])   . Gout   . Hypertension   . Orthostatic hypotension   . PUD (peptic ulcer disease)   . PVC's (premature ventricular contractions)   . Seizures ([place])   . Sinus bradycardia   . Stroke [facility])    a. L PCA CVA in 05/2014.  [name] Syncope    Past Surgical History:  Procedure Laterality Date  . CESAREAN SECTION    . DENTAL SURGERY    . Heart stent    . stint     Family History  Problem Relation Age of Onset  . Stroke Mother   . Heart attack Mother   . Lung cancer Mother   . Stroke Father   .  Prostate cancer Father   . Colon cancer Father   . Diabetes Mellitus II Daughter   . Multiple sclerosis Daughter   . Kidney disease Son   . Heart failure Son   . Cancer Paternal Grandfather    Social History   Tobacco Use  . Smoking status: Former Smoker    Last attempt to quit: 03/30/2014    Years since quitting: 3.6  . Smokeless tobacco: Never Used  Substance Use Topics  . Alcohol use: No  . Drug use: No    Interim medical history since last visit reviewed. Allergies and medications reviewed  Review of Systems Per HPI unless specifically indicated above     Objective:    BP 140/74   Pulse (!) 59   Temp 97.6 F (36.4 C) (Oral)   Resp 14   Ht 5\' 1"  (1.549 m)   Wt 181 lb 8 oz (82.3 kg)   SpO2 99%   BMI 34.29 kg/m   Wt Readings from Last 3 Encounters:  12/03/17 181 lb 8 oz (82.3 kg)  12/02/17 182 lb (82.6 kg)  11/28/16 154 lb (69.9 kg)    Physical Exam  Constitutional: She appears well-developed and well-nourished. No distress.  HENT:  Head: Normocephalic and atraumatic.  Eyes: EOM are normal. No scleral icterus.  Neck: No thyromegaly present.  Cardiovascular: Regular rhythm and normal heart sounds. Bradycardia present.  No murmur heard. Borderline bradycardia  Pulmonary/Chest: Effort normal and breath sounds normal. No respiratory distress. She has no wheezes.  Abdominal: Soft. Bowel sounds are normal. She exhibits no distension.  Musculoskeletal: She exhibits no edema.  Neurological: She is alert.  Skin: Skin is warm and dry. She is not diaphoretic. No pallor.  Psychiatric: She has a normal mood and affect. Her behavior is normal. Judgment and thought content normal.  Good historian   Diabetic Foot Form - Detailed   Diabetic Foot Exam - detailed Diabetic Foot exam was performed with the following findings:  Yes 12/03/2017  2:48 PM  Visual Foot Exam completed.:  Yes  Pulse Foot Exam completed.:  Yes  Right Dorsalis Pedis:  Present Left Dorsalis Pedis:   Present  Sensory Foot Exam Completed.:  Yes Semmes-Weinstein Monofilament Test R Site 1-Great Toe:  Pos L Site 1-Great Toe:  Pos        Results for orders placed or performed in visit on 11/27/16  HM DIABETES EYE  EXAM  Result Value Ref Range   HM Diabetic Eye Exam No Retinopathy No Retinopathy      Assessment & Plan:   Problem List Items Addressed This Visit      Cardiovascular and Mediastinum   Essential hypertension    Adjust med, try DASH guidelines; note to nephrologist as to why she is not taking an ARB      Relevant Medications   lovastatin (MEVACOR) 40 MG tablet     Endocrine   Diabetes mellitus ([place]) - Primary    Foot exam by MD today; check A1c      Relevant Medications   lovastatin (MEVACOR) 40 MG tablet   Other Relevant Orders   Hemoglobin A1C   Urine Microalbumin w/creat. ratio   Microalbumin / creatinine urine ratio   Lipid panel   Hemoglobin A1c     Genitourinary   CKD (chronic kidney disease) stage 3, GFR 30-59 ml/min (HCC)    Check creatinine and K+; patient will continue to see nephrologist; will ask them why she is not on ARB for renal protection; avoiding NSAIDs        Other   Seizures ([place])    Managed by neurologist; on medicine      Neutropenia ([place])    Check labs      Relevant Orders   CBC with Differential/Platelet   History of stroke    Weakness on the right side (arm >> leg)      Anxiety    Increase SSRI to 20 mg; close f/u; patient to see counselor, list of providers given      Relevant Medications   citalopram (CELEXA) 20 MG tablet    Other Visit Diagnoses    Screening for breast cancer       Relevant Orders   MM 3D SCREEN BREAST BILATERAL   Screen for colon cancer       Relevant Orders   Cologuard   Estrogen deficiency       Relevant Orders   DG Bone Density   Medication monitoring encounter       Relevant Orders   CBC with Differential/Platelet   COMPLETE METABOLIC PANEL WITH GFR   Weight gain        Relevant Orders   TSH   Inadequate material resources       Relevant Orders   Ambulatory referral to Connected Care       Follow up plan: Return in about 3 months (around 03/05/2018) for follow-up visit with Dr. Sanda Klein.  An after-visit summary was printed and given to the patient at [place].  Please see the patient instructions which may contain other information and recommendations beyond what is mentioned above in the assessment and plan.  Meds ordered this encounter  Medications  . citalopram (CELEXA) 20 MG tablet    Sig: Take 1 tablet (20 mg total) by mouth daily.    Dispense:  30 tablet    Refill:  3  . lovastatin (MEVACOR) 40 MG tablet    Sig: Take 1 tablet (40 mg total) by mouth at bedtime.    Dispense:  30 tablet    Refill:  3    Orders Placed This Encounter  Procedures  . MM 3D SCREEN BREAST BILATERAL  . DG Bone Density  . Hemoglobin A1C  . Urine Microalbumin w/creat. ratio  . Cologuard  . CBC with Differential/Platelet  . Microalbumin / creatinine urine ratio  . Lipid panel  . Hemoglobin A1c  . COMPLETE METABOLIC PANEL  WITH GFR  . TSH  . Ambulatory referral to Connected Care

## 2017-12-03 NOTE — Assessment & Plan Note (Addendum)
Check creatinine and K+; patient will continue to see nephrologist; will ask them why she is not on ARB for renal protection; avoiding NSAIDs

## 2017-12-03 NOTE — Assessment & Plan Note (Signed)
Managed by neurologist; on medicine

## 2017-12-03 NOTE — Assessment & Plan Note (Signed)
Check labs 

## 2017-12-03 NOTE — Assessment & Plan Note (Signed)
Foot exam by MD today; check A1c 

## 2017-12-03 NOTE — Patient Instructions (Addendum)
Please notify her kidney doctor about the blood pressure and see if she can take a medicine to help preserve her kidney function (called an ARB) Try to follow the DASH guidelines (DASH stands for Dietary Approaches to Stop Hypertension). Try to limit the sodium in your diet to no more than 1,500mg  of sodium per day. Certainly try to not exceed 2,000 mg per day at the very most. Do not add salt when cooking or at the table.  Check the sodium amount on labels when shopping, and choose items lower in sodium when given a choice. Avoid or limit foods that already contain a lot of sodium. Eat a diet rich in fruits and vegetables and whole grains, and try to lose weight if overweight or obese Try to limit saturated fats in your diet (bologna, hot dogs, barbeque, cheeseburgers, hamburgers, steak, bacon, sausage, cheese, etc.) and get more fresh fruits, vegetables, and whole grains

## 2017-12-03 NOTE — Assessment & Plan Note (Signed)
Increase SSRI to 20 mg; close f/u; patient to see counselor, list of providers given

## 2017-12-03 NOTE — Assessment & Plan Note (Signed)
Weakness on the right side (arm >> leg)

## 2017-12-04 LAB — COMPLETE METABOLIC PANEL WITH GFR
AG Ratio: 1.4 (calc) (ref 1.0–2.5)
ALBUMIN MSPROF: 4.2 g/dL (ref 3.6–5.1)
ALT: 8 U/L (ref 6–29)
AST: 13 U/L (ref 10–35)
Alkaline phosphatase (APISO): 75 U/L (ref 33–130)
BUN/Creatinine Ratio: 17 (calc) (ref 6–22)
BUN: 22 mg/dL (ref 7–25)
CALCIUM: 10 mg/dL (ref 8.6–10.4)
CO2: 27 mmol/L (ref 20–32)
CREATININE: 1.33 mg/dL — AB (ref 0.50–0.99)
Chloride: 106 mmol/L (ref 98–110)
GFR, EST AFRICAN AMERICAN: 47 mL/min/{1.73_m2} — AB (ref >=60)
GFR, EST NON AFRICAN AMERICAN: 41 mL/min/{1.73_m2} — AB (ref >=60)
GLUCOSE: 81 mg/dL (ref 65–99)
Globulin: 3 g/dL (calc) (ref 1.9–3.7)
Potassium: 4.6 mmol/L (ref 3.5–5.3)
Sodium: 138 mmol/L (ref 135–146)
TOTAL PROTEIN: 7.2 g/dL (ref 6.1–8.1)
Total Bilirubin: 0.4 mg/dL (ref 0.2–1.2)

## 2017-12-04 LAB — HEMOGLOBIN A1C
HEMOGLOBIN A1C: 4.9 %{Hb} (ref <5.7)
Hgb A1c MFr Bld: 4.9 % of total Hgb (ref <5.7)
Mean Plasma Glucose: 94 (calc)
Mean Plasma Glucose: 94 (calc)
eAG (mmol/L): 5.2 (calc)
eAG (mmol/L): 5.2 (calc)

## 2017-12-04 LAB — MICROALBUMIN / CREATININE URINE RATIO
CREATININE, URINE: 67 mg/dL (ref 20–275)
Creatinine, Urine: 67 mg/dL (ref 20–275)
MICROALB/CREAT RATIO: 18 ug/mg{creat} (ref <30)
Microalb Creat Ratio: 18 mcg/mg creat (ref <30)
Microalb, Ur: 1.2 mg/dL
Microalb, Ur: 1.2 mg/dL

## 2017-12-04 LAB — CBC WITH DIFFERENTIAL/PLATELET
BASOS PCT: 0.7 %
Basophils Absolute: 21 cells/uL (ref 0–200)
EOS PCT: 1.7 %
Eosinophils Absolute: 51 cells/uL (ref 15–500)
HCT: 41.1 % (ref 35.0–45.0)
Hemoglobin: 14.1 g/dL (ref 11.7–15.5)
LYMPHS ABS: 1014 {cells}/uL (ref 850–3900)
MCH: 32.4 pg (ref 27.0–33.0)
MCHC: 34.3 g/dL (ref 32.0–36.0)
MCV: 94.5 fL (ref 80.0–100.0)
MPV: 10 fL (ref 7.5–12.5)
Monocytes Relative: 9 %
Neutro Abs: 1644 cells/uL (ref 1500–7800)
Neutrophils Relative %: 54.8 %
PLATELETS: 195 10*3/uL (ref 140–400)
RBC: 4.35 10*6/uL (ref 3.80–5.10)
RDW: 11.2 % (ref 11.0–15.0)
Total Lymphocyte: 33.8 %
WBC mixed population: 270 cells/uL (ref 200–950)
WBC: 3 10*3/uL — AB (ref 3.8–10.8)

## 2017-12-04 LAB — LIPID PANEL
CHOLESTEROL: 142 mg/dL (ref <200)
HDL: 62 mg/dL (ref >50)
LDL Cholesterol (Calc): 67 mg/dL (calc)
NON-HDL CHOLESTEROL (CALC): 80 mg/dL (ref <130)
TRIGLYCERIDES: 53 mg/dL (ref <150)
Total CHOL/HDL Ratio: 2.3 (calc) (ref <5.0)

## 2017-12-05 ENCOUNTER — Encounter: Payer: Self-pay | Admitting: Family Medicine

## 2017-12-17 ENCOUNTER — Ambulatory Visit: Payer: Medicare Other

## 2018-01-01 ENCOUNTER — Ambulatory Visit: Payer: Medicare Other

## 2018-01-23 ENCOUNTER — Ambulatory Visit
Admission: RE | Admit: 2018-01-23 | Discharge: 2018-01-23 | Disposition: A | Discharge status: Home / Self Care | Payer: Medicare Other | Source: Ambulatory Visit | Attending: Family Medicine | Admitting: Family Medicine

## 2018-01-23 DIAGNOSIS — M8589 Other specified disorders of bone density and structure, multiple sites: Secondary | ICD-10-CM | POA: Diagnosis not present

## 2018-01-23 DIAGNOSIS — Z780 Asymptomatic menopausal state: Secondary | ICD-10-CM | POA: Diagnosis not present

## 2018-01-23 DIAGNOSIS — E2839 Other primary ovarian failure: Secondary | ICD-10-CM

## 2018-01-24 ENCOUNTER — Encounter: Payer: Self-pay | Admitting: Family Medicine

## 2018-01-24 DIAGNOSIS — M8580 Other specified disorders of bone density and structure, unspecified site: Secondary | ICD-10-CM

## 2018-01-24 HISTORY — DX: Other specified disorders of bone density and structure, unspecified site: M85.80

## 2018-01-29 ENCOUNTER — Telehealth: Payer: Self-pay | Admitting: Family Medicine

## 2018-01-29 DIAGNOSIS — D709 Neutropenia, unspecified: Secondary | ICD-10-CM

## 2018-01-29 DIAGNOSIS — R635 Abnormal weight gain: Secondary | ICD-10-CM

## 2018-01-29 NOTE — Telephone Encounter (Signed)
Copied from [place] [id number]. Topic: Quick Communication - See Telephone Encounter >> Jan 29, 2018 10:05 AM Rutherford Nail, NT wrote: CRM for notification. See Telephone encounter for: 01/29/18. Patient calling and states that she would like a different medication that lovastatin (MEVACOR) 40 MG tablet. States that her muscles ache when she takes them. [place], [place] CB#: [phone]

## 2018-01-31 MED ORDER — ATORVASTATIN CALCIUM 10 MG PO TABS: 10.0000 mg | ORAL_TABLET | Freq: Every day | ORAL | 1 refills | Status: DC

## 2018-01-31 NOTE — Telephone Encounter (Signed)
Pt.notified

## 2018-01-31 NOTE — Telephone Encounter (Signed)
In reviewing her chart, I looked at last labs Please see the note from 12/03/17 about lab results I'll put in referral to hematologist Let her know about this Also, let her know she can stop the lovastatin and I'll send in a new Rx; please add lovastatin to adverse reactions

## 2018-02-03 ENCOUNTER — Telehealth: Payer: Self-pay | Admitting: Family Medicine

## 2018-02-03 NOTE — Telephone Encounter (Signed)
Copied from [place] [id number]. Topic: Quick Communication - See Telephone Encounter >> Feb 03, 2018 12:03 PM Bea Graff, NT wrote: CRM for notification. See Telephone encounter for: 02/03/18. Pt would like to speak with Dr. Sanda Klein or her nurse to discuss why she has an appt on 02/11/18 for an issue with her blood.

## 2018-02-03 NOTE — Telephone Encounter (Signed)
Pt.notified

## 2018-02-09 NOTE — Progress Notes (Signed)
[place]  [phone] Fax:([phone]) [phone]  ID: Oneida Mckamey OB: 29-Dec-1950  MR#: [id number]  UKG#:[id number]  Patient Care Team: Arnetha Courser, MD as PCP - General (Family Medicine) [name], MD as Consulting Physician (Urology) Lavonia Dana, MD as Referring Physician (Nephrology)  CHIEF COMPLAINT: Neutropenia, unspecified  INTERVAL HISTORY: Patient is a 67 year old female who was noted to have a persistent neutropenia on routine blood work.  She has some mild confusion and memory problems from her history of seizures, but otherwise feels well.  She has no focal neurologic complaints.  She denies any recent fevers or illnesses.  She has good appetite and denies weight loss.  She has no chest pain or shortness of breath.  She denies any nausea, vomiting, constipation, or diarrhea.  She has no urinary complaints.  Patient feels at her baseline offers no specific complaints today.  REVIEW OF SYSTEMS:   Review of Systems  Constitutional: Negative.  Negative for fever, malaise/fatigue and weight loss.  Respiratory: Negative for cough, hemoptysis and shortness of breath.   Cardiovascular: Negative.  Negative for chest pain and leg swelling.  Gastrointestinal: Negative.  Negative for abdominal pain.  Genitourinary: Negative.  Negative for dysuria.  Musculoskeletal: Negative.  Negative for back pain.  Skin: Negative.  Negative for rash.  Neurological: Negative.  Negative for dizziness, focal weakness, seizures, weakness and headaches.  Psychiatric/Behavioral: Negative.  The patient is not nervous/anxious.     As per HPI. Otherwise, a complete review of systems is negative.  PAST MEDICAL HISTORY: Past Medical History:  Diagnosis Date  . CAD (coronary artery disease)    a. Pt reports in 2000 she had a stent to a coronary artery, details unclear, outside hospital.  . Carotid stenosis    a. CT angio head 07/2014 - 50-60% stenoses of prox ICA  bilaterally.  . CHF (congestive heart failure) ([place])    a. Pt reports in 2000 she had CHF, details unclear, outside hospital.  . CKD (chronic kidney disease), stage III ([place])   . Diabetes mellitus ([place])   . Gout   . Hypertension   . Orthostatic hypotension   . Osteopenia 01/24/2018   DEXA Sept 2019  . PUD (peptic ulcer disease)   . PVC's (premature ventricular contractions)   . Seizures ([place])   . Sinus bradycardia   . Stroke [facility])    a. L PCA CVA in 05/2014.  [name] Syncope     PAST SURGICAL HISTORY: Past Surgical History:  Procedure Laterality Date  . CESAREAN SECTION    . DENTAL SURGERY    . Heart stent    . stint      FAMILY HISTORY: Family History  Problem Relation Age of Onset  . Stroke Mother   . Heart attack Mother   . Lung cancer Mother   . Stroke Father   . Prostate cancer Father   . Colon cancer Father   . Diabetes Mellitus II Daughter   . Multiple sclerosis Daughter   . Kidney disease Son   . Heart failure Son   . Cancer Paternal Grandfather     ADVANCED DIRECTIVES (Y/N):  N  HEALTH MAINTENANCE: Social History   Tobacco Use  . Smoking status: Former Smoker    Last attempt to quit: 03/30/2014    Years since quitting: 3.8  . Smokeless tobacco: Never Used  Substance Use Topics  . Alcohol use: No  . Drug use: No     Colonoscopy:  PAP:  Bone density:  Lipid panel:  No Known Allergies  Current Outpatient Medications  Medication Sig Dispense Refill  . amLODipine (NORVASC) 5 MG tablet TAKE 1 TABLET BY MOUTH ONCE DAILY *NEW DOSE* 90 tablet 3  . ASPIR-LOW 81 MG EC tablet TAKE 1 TABLET BY MOUTH DAILY 30 tablet 11  . citalopram (CELEXA) 20 MG tablet Take 1 tablet (20 mg total) by mouth daily. 30 tablet 3  . clonazePAM (KLONOPIN) 1 MG tablet Take 1 tablet (1 mg total) by mouth 2 (two) times daily as needed for anxiety. 45 tablet 0  . fluticasone (FLONASE) 50 MCG/ACT nasal spray Place 2 sprays into both nostrils daily as needed for allergies (allergies).      [name] levETIRAcetam (KEPPRA) 500 MG tablet One in the morning, 2 tabs at night 270 tablet 4  . loratadine (CLARITIN) 10 MG tablet TAKE 1 TABLET BY MOUTH DAILY AS NEEDED FOR ALLERGIES. 90 tablet 3  . UNABLE TO FIND Med Name: eye drops for dry eye     No current facility-administered medications for this visit.     OBJECTIVE: Vitals:   02/11/18 1358  BP: (!) 145/82  Pulse: 62  Resp: 20  Temp: 97.7 F (36.5 C)     Body mass index is 35.41 kg/m.    ECOG FS:0 - Asymptomatic  General: Well-developed, well-nourished, no acute distress. Eyes: Pink conjunctiva, anicteric sclera. HEENT: Normocephalic, moist mucous membranes, clear oropharnyx. Lungs: Clear to auscultation bilaterally. Heart: Regular rate and rhythm. No rubs, murmurs, or gallops. Abdomen: Soft, nontender, nondistended. No organomegaly noted, normoactive bowel sounds. Musculoskeletal: No edema, cyanosis, or clubbing. Neuro: Alert, answering all questions appropriately. Cranial nerves grossly intact. Skin: No rashes or petechiae noted. Psych: Normal affect. Lymphatics: No cervical, calvicular, axillary or inguinal LAD.   LAB RESULTS:  Lab Results  Component Value Date   NA 138 12/03/2017   K 4.6 12/03/2017   CL 106 12/03/2017   CO2 27 12/03/2017   GLUCOSE 81 12/03/2017   BUN 22 12/03/2017   CREATININE 1.33 (H) 12/03/2017   CALCIUM 10.0 12/03/2017   PROT 7.2 12/03/2017   ALBUMIN 4.2 11/19/2016   AST 13 12/03/2017   ALT 8 12/03/2017   ALKPHOS 64 11/19/2016   BILITOT 0.4 12/03/2017   GFRNONAA 41 (L) 12/03/2017   GFRAA 47 (L) 12/03/2017    Lab Results  Component Value Date   WBC 2.9 (L) 02/11/2018   NEUTROABS 1.6 (L) 02/11/2018   HGB 13.7 02/11/2018   HCT 41.8 02/11/2018   MCV 96.5 02/11/2018   PLT 211 02/11/2018     STUDIES: Dg Bone Density  Result Date: 01/23/2018 EXAM: DUAL X-RAY ABSORPTIOMETRY (DXA) FOR BONE MINERAL DENSITY IMPRESSION: Referring Physician:  Rip Harbour P LADA Your patient completed a  BMD test using Lunar IDXA DXA system ( analysis version: 16 ) manufactured by [employer]. PATIENT: Name: Merrisa, Skorupski Patient ID: [id number] Birth Date: April 17, 1951 Height: 60.0 in. Sex: Female Measured: 01/23/2018 Weight: 185.2 lbs. Indications: Estrogen Deficient, Family Hist. (Parent hip fracture), Family History of Osteoporosis, Height Loss (781.91), Keppra, Low Calcium Intake (269.3), Postmenopausal Fractures: None Treatments: None ASSESSMENT: The BMD measured at Femur Neck Left is 0.731 g/cm2 with a T-score of -2.2. This patient is considered osteopenic according to [facility]) criteria. The scan quality is good. Site Region Measured Date Measured Age YA BMD Significant CHANGE T-score DualFemur Neck Left  1/23/2018    68.7         -2.2    0.731 g/cm2 AP Spine  L1-L4  01/23/2018    68.7         -1.7    0.981 g/cm2 DualFemur Total Mean 01/23/2018    68.7         -1.3    0.848 g/cm2 World Health Organization [facility]) criteria for post-menopausal, Caucasian Women: Normal       T-score at or above -1 SD Osteopenia   T-score between -1 and -2.5 SD Osteoporosis T-score at or below -2.5 SD RECOMMENDATION: 1. All patients should optimize calcium and vitamin D intake. 2. Consider FDA approved medical therapies in postmenopausal women and men aged 49 years and older, based on the following: a. A hip or vertebral (clinical or morphometric) fracture b. T- score < or = -2.5 at the femoral neck or spine after appropriate evaluation to exclude secondary causes c. Low bone mass (T-score between -1.0 and -2.5 at the femoral neck or spine) and a 10 year probability of a hip fracture > or = 3% or a 10 year probability of a major osteoporosis-related fracture > or = 20% based on the [place]-adapted WHO algorithm d. Clinician judgment and/or patient preferences may indicate treatment for people with 10-year fracture probabilities above or below these levels FOLLOW-UP: People with diagnosed cases of  osteoporosis or at high risk for fracture should have regular bone mineral density tests. For patients eligible for Medicare, routine testing is allowed once every 2 years. The testing frequency can be increased to one year for patients who have rapidly progressing disease, those who are receiving or discontinuing medical therapy to restore bone mass, or have additional risk factors. I have reviewed this report and agree with the above findings. [facility] FRAX* 10-year Probability of Fracture Based on femoral neck BMD: DualFemur (Left) Major Osteoporotic Fracture: 8.8% Hip Fracture:                1.8% Population:                  [place] (Black) Risk Factors:                Family Hist. (Parent hip fracture) *FRAX is a [place] [employer] for Metabolic Bone Disease, a [place] (WHO) [facility]. ASSESSMENT: The probability of a major osteoporotic fracture is 8.8 % within the next ten years. The probability of hip fracture is  1.8  % within the next 10 years. Electronically Signed   By: Marin Olp M.D.   On: 01/23/2018 14:39    ASSESSMENT: Neutropenia, unspecified.  PLAN:   1. Neutropenia, unspecified: Upon review the records, patient's white blood cell count has been decreasing relatively stable since at least May 2016.  Her total white blood count today is 2.9 which is approximately her baseline.  All of her other laboratory work is either negative or within normal limits.  Peripheral blood flow cytometry is pending at time of dictation.  Suspect this may be medication induced from [place] which lists a 3% incidence of decreased white blood cell count.  No intervention is needed.  Patient does not require bone marrow biopsy.  Return to clinic in 1 month with repeat laboratory work and further evaluation.  If everything continues to be negative, patient likely can be discharged from clinic. 2.  Seizures: Continue Keppra as  prescribed.    I spent a total of 45 minutes face-to-face with the patient of which greater than 50% of the visit was spent in counseling and coordination of  care as detailed above.  Patient expressed understanding and was in agreement with this plan. She also understands that She can call clinic at any time with any questions, concerns, or complaints.    Lloyd Huger, MD   02/12/2018 10:51 AM

## 2018-02-11 ENCOUNTER — Encounter: Payer: Self-pay | Admitting: Oncology

## 2018-02-11 ENCOUNTER — Encounter (INDEPENDENT_AMBULATORY_CARE_PROVIDER_SITE_OTHER): Payer: Self-pay

## 2018-02-11 ENCOUNTER — Inpatient Hospital Stay: Payer: Medicare Other | Attending: Oncology | Admitting: Oncology

## 2018-02-11 ENCOUNTER — Inpatient Hospital Stay: Payer: Medicare Other

## 2018-02-11 ENCOUNTER — Other Ambulatory Visit: Payer: Self-pay

## 2018-02-11 VITALS — BP 145/82 | HR 62 | Temp 97.7°F | Resp 20 | Wt 187.4 lb

## 2018-02-11 DIAGNOSIS — D709 Neutropenia, unspecified: Secondary | ICD-10-CM

## 2018-02-11 DIAGNOSIS — I130 Hypertensive heart and chronic kidney disease with heart failure and stage 1 through stage 4 chronic kidney disease, or unspecified chronic kidney disease: Secondary | ICD-10-CM | POA: Diagnosis not present

## 2018-02-11 DIAGNOSIS — N183 Chronic kidney disease, stage 3 (moderate): Secondary | ICD-10-CM | POA: Diagnosis not present

## 2018-02-11 DIAGNOSIS — E1122 Type 2 diabetes mellitus with diabetic chronic kidney disease: Secondary | ICD-10-CM | POA: Diagnosis not present

## 2018-02-11 LAB — CBC WITH DIFFERENTIAL/PLATELET
ABS IMMATURE GRANULOCYTES: 0 10*3/uL (ref 0.00–0.07)
BASOS ABS: 0 10*3/uL (ref 0.0–0.1)
BASOS PCT: 1 %
EOS ABS: 0.1 10*3/uL (ref 0.0–0.5)
Eosinophils Relative: 2 %
HCT: 41.8 % (ref 36.0–46.0)
Hemoglobin: 13.7 g/dL (ref 12.0–15.0)
IMMATURE GRANULOCYTES: 0 %
Lymphocytes Relative: 37 %
Lymphs Abs: 1.1 10*3/uL (ref 0.7–4.0)
MCH: 31.6 pg (ref 26.0–34.0)
MCHC: 32.8 g/dL (ref 30.0–36.0)
MCV: 96.5 fL (ref 80.0–100.0)
Monocytes Absolute: 0.2 10*3/uL (ref 0.1–1.0)
Monocytes Relative: 8 %
NEUTROS ABS: 1.6 10*3/uL — AB (ref 1.7–7.7)
NEUTROS PCT: 52 %
NRBC: 0 % (ref 0.0–0.2)
PLATELETS: 211 10*3/uL (ref 150–400)
RBC: 4.33 MIL/uL (ref 3.87–5.11)
RDW: 11 % — AB (ref 11.5–15.5)
WBC: 2.9 10*3/uL — AB (ref 4.0–10.5)

## 2018-02-11 LAB — LACTATE DEHYDROGENASE: LDH: 107 U/L (ref 98–192)

## 2018-02-11 LAB — IRON AND TIBC
Iron: 74 ug/dL (ref 28–170)
Saturation Ratios: 23 % (ref 10.4–31.8)
TIBC: 328 ug/dL (ref 250–450)
UIBC: 254 ug/dL

## 2018-02-11 LAB — FERRITIN: FERRITIN: 73 ng/mL (ref 11–307)

## 2018-02-11 NOTE — Progress Notes (Signed)
Patient here today for new evaluation regarding neutropenia.

## 2018-02-12 ENCOUNTER — Ambulatory Visit
Admission: RE | Admit: 2018-02-12 | Discharge: 2018-02-12 | Disposition: A | Discharge status: Home / Self Care | Payer: Medicare Other | Source: Ambulatory Visit | Attending: Family Medicine | Admitting: Family Medicine

## 2018-02-12 DIAGNOSIS — Z1231 Encounter for screening mammogram for malignant neoplasm of breast: Secondary | ICD-10-CM | POA: Diagnosis not present

## 2018-02-12 DIAGNOSIS — Z1239 Encounter for other screening for malignant neoplasm of breast: Secondary | ICD-10-CM

## 2018-02-14 LAB — COMP PANEL: LEUKEMIA/LYMPHOMA

## 2018-02-19 LAB — NEUTROPHIL AB TEST LEVEL 1: NEUTROPHIL SCR/PANEL INTERP.: POSITIVE — AB

## 2018-03-13 ENCOUNTER — Ambulatory Visit: Payer: Medicare Other | Admitting: Family Medicine

## 2018-03-15 NOTE — Progress Notes (Deleted)
[place]  [phone] [phone]) [phone]  ID: Leslie Houston OB: 08/26/1950  MR#: [id number]  POE#:[id number]  Patient Care Team: Arnetha Courser, MD as PCP - General (Family Medicine) [name], MD as Consulting Physician (Urology) Lavonia Dana, MD as Referring Physician (Nephrology)  CHIEF COMPLAINT: Neutropenia, unspecified  INTERVAL HISTORY: Patient is a 67 year old female who was noted to have a persistent neutropenia on routine blood work.  She has some mild confusion and memory problems from her history of seizures, but otherwise feels well.  She has no focal neurologic complaints.  She denies any recent fevers or illnesses.  She has good appetite and denies weight loss.  She has no chest pain or shortness of breath.  She denies any nausea, vomiting, constipation, or diarrhea.  She has no urinary complaints.  Patient feels at her baseline offers no specific complaints today.  REVIEW OF SYSTEMS:   Review of Systems  Constitutional: Negative.  Negative for fever, malaise/fatigue and weight loss.  Respiratory: Negative for cough, hemoptysis and shortness of breath.   Cardiovascular: Negative.  Negative for chest pain and leg swelling.  Gastrointestinal: Negative.  Negative for abdominal pain.  Genitourinary: Negative.  Negative for dysuria.  Musculoskeletal: Negative.  Negative for back pain.  Skin: Negative.  Negative for rash.  Neurological: Negative.  Negative for dizziness, focal weakness, seizures, weakness and headaches.  Psychiatric/Behavioral: Negative.  The patient is not nervous/anxious.     As per HPI. Otherwise, a complete review of systems is negative.  PAST MEDICAL HISTORY: Past Medical History:  Diagnosis Date  . CAD (coronary artery disease)    a. Pt reports in 2000 she had a stent to a coronary artery, details unclear, outside hospital.  . Carotid stenosis    a. CT angio head 07/2014 - 50-60% stenoses of prox ICA  bilaterally.  . CHF (congestive heart failure) ([place])    a. Pt reports in 2000 she had CHF, details unclear, outside hospital.  . CKD (chronic kidney disease), stage III ([place])   . Diabetes mellitus ([place])   . Gout   . Hypertension   . Orthostatic hypotension   . Osteopenia 01/24/2018   DEXA Sept 2019  . PUD (peptic ulcer disease)   . PVC's (premature ventricular contractions)   . Seizures ([place])   . Sinus bradycardia   . Stroke [facility])    a. L PCA CVA in 05/2014.  [name] Syncope     PAST SURGICAL HISTORY: Past Surgical History:  Procedure Laterality Date  . CESAREAN SECTION    . DENTAL SURGERY    . Heart stent    . stint      FAMILY HISTORY: Family History  Problem Relation Age of Onset  . Stroke Mother   . Heart attack Mother   . Lung cancer Mother   . Stroke Father   . Prostate cancer Father   . Colon cancer Father   . Diabetes Mellitus II Daughter   . Multiple sclerosis Daughter   . Kidney disease Son   . Heart failure Son   . Cancer Paternal Grandfather     ADVANCED DIRECTIVES (Y/N):  N  HEALTH MAINTENANCE: Social History   Tobacco Use  . Smoking status: Former Smoker    Last attempt to quit: 03/30/2014    Years since quitting: 3.9  . Smokeless tobacco: Never Used  Substance Use Topics  . Alcohol use: No  . Drug use: No     Colonoscopy:  PAP:  Bone density:  Lipid panel:  No Known Allergies  Current Outpatient Medications  Medication Sig Dispense Refill  . amLODipine (NORVASC) 5 MG tablet TAKE 1 TABLET BY MOUTH ONCE DAILY *NEW DOSE* 90 tablet 3  . ASPIR-LOW 81 MG EC tablet TAKE 1 TABLET BY MOUTH DAILY 30 tablet 11  . citalopram (CELEXA) 20 MG tablet Take 1 tablet (20 mg total) by mouth daily. 30 tablet 3  . clonazePAM (KLONOPIN) 1 MG tablet Take 1 tablet (1 mg total) by mouth 2 (two) times daily as needed for anxiety. 45 tablet 0  . fluticasone (FLONASE) 50 MCG/ACT nasal spray Place 2 sprays into both nostrils daily as needed for allergies (allergies).      [name] levETIRAcetam (KEPPRA) 500 MG tablet One in the morning, 2 tabs at night 270 tablet 4  . loratadine (CLARITIN) 10 MG tablet TAKE 1 TABLET BY MOUTH DAILY AS NEEDED FOR ALLERGIES. 90 tablet 3  . UNABLE TO FIND Med Name: eye drops for dry eye     No current facility-administered medications for this visit.     OBJECTIVE: There were no vitals filed for this visit.   There is no height or weight on file to calculate BMI.    ECOG FS:0 - Asymptomatic  General: Well-developed, well-nourished, no acute distress. Eyes: Pink conjunctiva, anicteric sclera. HEENT: Normocephalic, moist mucous membranes, clear oropharnyx. Lungs: Clear to auscultation bilaterally. Heart: Regular rate and rhythm. No rubs, murmurs, or gallops. Abdomen: Soft, nontender, nondistended. No organomegaly noted, normoactive bowel sounds. Musculoskeletal: No edema, cyanosis, or clubbing. Neuro: Alert, answering all questions appropriately. Cranial nerves grossly intact. Skin: No rashes or petechiae noted. Psych: Normal affect. Lymphatics: No cervical, calvicular, axillary or inguinal LAD.   LAB RESULTS:  Lab Results  Component Value Date   NA 138 12/03/2017   K 4.6 12/03/2017   CL 106 12/03/2017   CO2 27 12/03/2017   GLUCOSE 81 12/03/2017   BUN 22 12/03/2017   CREATININE 1.33 (H) 12/03/2017   CALCIUM 10.0 12/03/2017   PROT 7.2 12/03/2017   ALBUMIN 4.2 11/19/2016   AST 13 12/03/2017   ALT 8 12/03/2017   ALKPHOS 64 11/19/2016   BILITOT 0.4 12/03/2017   GFRNONAA 41 (L) 12/03/2017   GFRAA 47 (L) 12/03/2017    Lab Results  Component Value Date   WBC 2.9 (L) 02/11/2018   NEUTROABS 1.6 (L) 02/11/2018   HGB 13.7 02/11/2018   HCT 41.8 02/11/2018   MCV 96.5 02/11/2018   PLT 211 02/11/2018     STUDIES: No results found.  ASSESSMENT: Neutropenia, unspecified.  PLAN:   1. Neutropenia, unspecified: Upon review the records, patient's white blood cell count has been decreasing relatively stable since at  least May 2016.  Her total white blood count today is 2.9 which is approximately her baseline.  All of her other laboratory work is either negative or within normal limits.  Peripheral blood flow cytometry is pending at time of dictation.  Suspect this may be medication induced from [place] which lists a 3% incidence of decreased white blood cell count.  No intervention is needed.  Patient does not require bone marrow biopsy.  Return to clinic in 1 month with repeat laboratory work and further evaluation.  If everything continues to be negative, patient likely can be discharged from clinic. 2.  Seizures: Continue Keppra as prescribed.    I spent a total of 45 minutes face-to-face with the patient of which greater than 50% of the visit was spent in counseling and coordination of  care as detailed above.  Patient expressed understanding and was in agreement with this plan. She also understands that She can call clinic at any time with any questions, concerns, or complaints.    Lloyd Huger, MD   03/15/2018 8:20 AM

## 2018-03-17 ENCOUNTER — Encounter: Payer: Self-pay | Admitting: Oncology

## 2018-03-17 ENCOUNTER — Inpatient Hospital Stay: Payer: Medicare Other | Admitting: Oncology

## 2018-04-11 ENCOUNTER — Telehealth: Payer: Self-pay

## 2018-04-11 NOTE — Telephone Encounter (Signed)
04/11/18 Spoke with patient regarding community resources for diet/food, care-giver support and transportation.  Patient stated that she no longer needed any resources. Her friend Seymour Bars is her caregiver and provides transportation.MA

## 2018-05-05 ENCOUNTER — Other Ambulatory Visit: Payer: Self-pay | Admitting: Neurology

## 2018-05-05 ENCOUNTER — Other Ambulatory Visit: Payer: Self-pay | Admitting: Family Medicine

## 2018-05-05 NOTE — Telephone Encounter (Signed)
Please contact patient about the atorvastatin That medicine was removed by someone a few months ago, reason given was "error" Find out more if patient is not supposed to be taking a medicine for cholesterol, why not? did she have a reaction? is she actually taking it and was it perhaps removed by mistake Thank you

## 2018-05-06 NOTE — Telephone Encounter (Signed)
Thank you :)

## 2018-05-06 NOTE — Telephone Encounter (Signed)
Pt states she is still currently taking

## 2018-06-03 ENCOUNTER — Other Ambulatory Visit: Payer: Self-pay | Admitting: Family Medicine

## 2018-06-10 ENCOUNTER — Ambulatory Visit: Payer: Medicare Other | Admitting: Neurology

## 2018-06-10 ENCOUNTER — Ambulatory Visit: Payer: Medicare Other | Admitting: Nurse Practitioner

## 2018-06-10 ENCOUNTER — Telehealth: Payer: Self-pay | Admitting: *Deleted

## 2018-06-10 ENCOUNTER — Telehealth: Payer: Self-pay | Admitting: Neurology

## 2018-06-10 NOTE — Telephone Encounter (Signed)
Dr. Krista Blue only prescribed Keppra for her seizures.  I returned the call to the patient.  Says she feels dizzy when she takes Lipitor.  She skipped a dose last night and she did not feel dizzy. This prescription is managed by her PCP.  She will check with Dr. Sanda Klein.

## 2018-06-10 NOTE — Telephone Encounter (Signed)
Patient called the morning of her appt to cancel her follow up.  The person responsible for her transportation has a virus and is unable to bring her today.  She rescheduled.

## 2018-06-10 NOTE — Telephone Encounter (Signed)
Pt states that when she takes this medication atorvastatin (LIPITOR) 10 MG tablet she feels dizzy at night and when she gets up in the morning.  Pt asking for a call to discuss, she is unsure if it is this or another medication causing her to feel dizzy when she gets up at night and in the morning.

## 2018-06-10 NOTE — Addendum Note (Signed)
Addended by: Noberto Retort C on: 06/10/2018 10:21 AM   Modules accepted: Orders

## 2018-06-11 ENCOUNTER — Encounter: Payer: Self-pay | Admitting: Neurology

## 2018-06-13 ENCOUNTER — Encounter: Payer: Self-pay | Admitting: Family Medicine

## 2018-06-13 ENCOUNTER — Telehealth: Payer: Self-pay | Admitting: Family Medicine

## 2018-06-13 DIAGNOSIS — Z1211 Encounter for screening for malignant neoplasm of colon: Secondary | ICD-10-CM

## 2018-06-13 DIAGNOSIS — Z800 Family history of malignant neoplasm of digestive organs: Secondary | ICD-10-CM

## 2018-06-13 HISTORY — DX: Family history of malignant neoplasm of digestive organs: Z80.0

## 2018-06-13 NOTE — Telephone Encounter (Signed)
I don't see any results on the chart for Cologuard or completion of colon cancer screening in [facility] list I tried to call her but she was not home  I'll try her later

## 2018-06-13 NOTE — Telephone Encounter (Signed)
I reached patient She says she already sent the stool off in the big bucket (not just stool cards by her description, but it really does sound like Cologuard); we just don't see those results here I explained with her family hx and personal hx of polyps, it will best if she has a colonoscopy She has seizures and a stroke She is concerned about having colonoscopy with her seizures and stroke She was actually supposed to have had a colonoscopy in 2018 with Dr. Vicente Males I told her I recommend the colonoscopy and will send him a note to see if he agrees or if he thinks Cologuard is adequate (as she is hoping, since she is concerned about the risks of a colonoscopy) We'll try to request copy from Cologuard to get that on the chart, either way She thanked me for calling --------------------------------------------  Leslie Houston, contact [facility], request any and all results they have for her  Dr. Vicente Males, please see above and have your staff contact patient with your thoughts on colon cancer screening given her circumstances and preferences

## 2018-06-13 NOTE — Assessment & Plan Note (Signed)
Recommended colonoscopy to patient

## 2018-06-13 NOTE — Telephone Encounter (Signed)
Please contact patient She has a care gap Her colon cancer screening has not been completed We're going to recommend that she undergo a colonoscopy I'll order Encourage her to stay up on her other health maintenance items (see red care gaps)

## 2018-06-16 NOTE — Telephone Encounter (Signed)
Called pt to offer her an appointment with Dr. Vicente Males to discuss having the colonoscopy performed. Pt states she is concerned about her seizures and history of stroke. I explained to pt that we will send for medical clearance from her neurologist. Pt states she needs time to decide if she wants to proceed with the colonoscopy.

## 2018-06-16 NOTE — Telephone Encounter (Signed)
Called cologuard, no results for patient one that was done in aug was not labeled correctly and not run.

## 2018-06-16 NOTE — Telephone Encounter (Signed)
Reviewed the message below. My thoughts on Cologuard 1.  If performed for screening and if positive, the colonoscopy would be "diagnostic" at that point of time as a screening test has already been utilized by the stool test.  If it were to be diagnostic the charges the patient can incur could be significantly higher.  2.  I usually do not recommend a Cologuard if the patient is not willing to have a colonoscopy.  The reason I do so is if it were to be positive then we are in a uncomfortable position where the patient does not want to have the procedure and we have a positive result.  I would perform it only with a clear understanding and agreement that she would be willing to pursue a colonoscopy if it were to be positive.  Amongst the stool tests the FIT and Cologuard is probably better off the lot.  Despite which they can be false positive tests as well.  This could at times lead to anxiety while awaiting results of the colonoscopy.  Sherald Hess can you inform the patient of the above.  If she is keen to discuss this any further I would be happy to explain to her in person at an office visit.  C/c Lada, Satira Anis, MD

## 2018-06-18 ENCOUNTER — Telehealth: Payer: Self-pay

## 2018-06-18 NOTE — Telephone Encounter (Signed)
Contacted patient to schedule her colonoscopy.  She is certain that she has completed the cologuard test in 2019.  I informed her that I would contact the cologuard company to see if they had any results initially I was told I needed the ordering providers NPI number.  I contacted Dr. Delight Ovens to ask for NPI number, then  re-contacted [facility]oguard with this information.  They were still unable to pull her up even after placing me on hold to look further. They suggested to have the patient contact them directly to discuss or have the ordering provider contact office.  I advised patient to contact [employer] directly to inquire more, and if they still are unable to provide-she should contact her insurance company to see if her insurance was billed.  I asked if she could have been mistaken.  She said she knows she did it and received the call saying it was normal  At this point, I simply gave her the pros and cons of having the colonoscopy and strongly advised her to have the procedure especially with her family history of colon cancer.  She has decided to have me schedule and will call me back after she checks with her friend Emilia Beck availability since she does not have anyone else to accompany her.  Thanks [employer]

## 2018-07-01 ENCOUNTER — Encounter: Payer: Self-pay | Admitting: *Deleted

## 2018-07-02 NOTE — Progress Notes (Addendum)
PATIENT: Leslie Houston DOB: 1950/11/28  REASON FOR VISIT: follow up HISTORY FROM: patient  HISTORY OF PRESENT ILLNESS: Today 07/03/18  HISTORY  HISTORY OF PRESENT ILLNESS:Leslie Houston is a 68 yo RH female with her care giver [name], her PCP is [name].  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  She had a history of epilepsy, generalized tonic-clonic seizure when she was young, since her most recent left occipital stroke early 2016, she began to have frequent spells, smelling dust smell, eyes crossed, transient confusion , she sometimes fell to the ground, lasting less than a minute, she can have few spells in one day.She presented to hospital multiple times for similar presentation, I have reviewed MRI of brain in May 2016, large left occipital stroke, mild small vessel disease at supratentorium.  Laboratory showed normal BMP, with exception of creatinine 1.3, mild low WBC 3.8 Echocardiogram 08/23/14, normal ejection fraction 55-60%, no significant structural abnormality,  She is now wearing a cardiac monitoring,   UPDATE June 28th 2016:YY She is with her care giver [name], She only had one episode of seizure like event, sudden onset of confusion, instead of daily similar episode since she started taking Keppra 500 mg twice a day, she has frequent almost daily headaches, We have reviewed EEG result, that was normal in June 2016 I have reviewed MRI of brain scan with patient, chronic left occipital stroke, supratentorium small vessel disease, no acute lesions  UPDATE Jan 31 2015:YY She could not tolerate Topamax, made her feel different, had diarrhea, she is back on Keppr 500mg  bid, she has two spells  since June 2016,  She was taken to the hospital in June 14 and July 18 for seizure-like event, she is a little bit confused about her medications, she is tolerating Keppra much better  UPDATE December 02 2017: She is taking keppra 500 mg 1/2, she has two seizure since Jan 2019, she feel like that she is going to pass out, lying down keep still, she will be fine in few minutes.  One was in Jan 2019. 2nd one was when her son died in 2017/08/22, she does not want changes her medication.   Update July 03, 2018 SS: She reports her son who is 68 years old died last year, since then she reports weight gain.  She reports a lot of stress as she has been caring for her father's son.  She reports at Christmas time she had a little seizure.  She describes the episode as she felt dizzy and found herself staring off.  She reports it lasted only a few seconds and she was able to return back to normal quickly.  She reports at that time she had decreased her Keppra dose to only taking 500 mg at bedtime.  She has been feeling dizzy taking Keppra and Lipitor, which was the reason she decreased her dose.  She reports she does not drive.  She is here today with a friend.  She denies any new problems or concerns.  REVIEW OF SYSTEMS: Out of a complete 14 system review of symptoms, the patient complains only of the following symptoms, and all other reviewed systems are negative.  Runny nose, eye itching, wheezing,  insomnia, frequent waking, snoring, incontinence of bladder, joint pain, aching muscles, muscle cramps, dizziness, headache, seizure, weakness, nervous/anxious  ALLERGIES: No Known Allergies  HOME MEDICATIONS: Outpatient Medications Prior to Visit  Medication Sig Dispense Refill  . amLODipine (NORVASC) 5 MG tablet TAKE 1 TABLET BY MOUTH ONCE DAILY 90 tablet 1  . citalopram (CELEXA) 20 MG tablet Take 1 tablet (20 mg total) by mouth daily. 30 tablet 3  . fluticasone (FLONASE) 50 MCG/ACT nasal spray Place 2 sprays  into both nostrils daily as needed for allergies (allergies).     . loratadine (CLARITIN) 10 MG tablet TAKE 1 TABLET BY MOUTH DAILY AS NEEDED FOR ALLERGIES. 90 tablet 3  . UNABLE TO FIND Med Name: eye drops for dry eye    . ASPIR-LOW 81 MG EC tablet TAKE 1 TABLET BY MOUTH DAILY 30 tablet 11  . levETIRAcetam (KEPPRA) 500 MG tablet One in the morning, 2 tabs at night 270 tablet 4  . atorvastatin (LIPITOR) 10 MG tablet TAKE 1 TABLET BY MOUTH EVERY NIGHT AT BEDTIME (Patient not taking: Reported on 07/03/2018) 30 tablet 1   No facility-administered medications prior to visit.     PAST MEDICAL HISTORY: Past Medical History:  Diagnosis Date  . CAD (coronary artery disease)    a. Pt reports in 2000 she had a stent to a coronary artery, details unclear, outside hospital.  . Carotid stenosis    a. CT angio head 07/2014 - 50-60% stenoses of prox ICA bilaterally.  . CHF (congestive heart failure) ([place])    a. Pt reports in 2000 she had CHF, details unclear, outside hospital.  . CKD (chronic kidney disease), stage III ([place])   . Diabetes mellitus ([place])   . Family hx of colon cancer 06/13/2018   Father diagnosed in his 21's  . Gout   . Hypertension   . Orthostatic hypotension   . Osteopenia 01/24/2018   DEXA Sept 2019  . PUD (peptic ulcer disease)   . PVC's (premature ventricular contractions)   . Seizures ([place])   . Sinus bradycardia   . Stroke [facility])    a. L PCA CVA in 05/2014.  [name] Syncope     PAST SURGICAL HISTORY: Past Surgical History:  Procedure Laterality Date  . CESAREAN SECTION    . DENTAL SURGERY    . Heart stent    . stint      FAMILY HISTORY: Family History  Problem Relation Age of Onset  . Stroke Mother   . Heart attack Mother   . Lung cancer Mother   . Stroke Father   . Prostate cancer Father   . Colon cancer Father   . Diabetes Mellitus II Daughter   . Multiple sclerosis Daughter   . Kidney disease Son   . Heart failure Son   . Cancer Paternal Grandfather      SOCIAL HISTORY: Social History   Socioeconomic History  . Marital status: Single    Spouse name: Not on file  . Number of children: 2  . Years of education: 23  . Highest education level: Not on file  Occupational History  . Occupation: Disabled  Social Needs  . Financial resource strain: Not on file  . Food insecurity:    Worry: Not on file    Inability: Not on file  . Transportation needs:    Medical: Not on file    Non-medical: Not on file  Tobacco Use  . Smoking status: Former Smoker    Last attempt to quit:  03/30/2014    Years since quitting: 4.2  . Smokeless tobacco: Never Used  Substance and Sexual Activity  . Alcohol use: No  . Drug use: No  . Sexual activity: Not Currently  Lifestyle  . Physical activity:    Days per week: Not on file    Minutes per session: Not on file  . Stress: Not on file  Relationships  . Social connections:    Talks on phone: Not on file    Gets together: Not on file    Attends religious service: Not on file    Active member of club or organization: Not on file    Attends meetings of clubs or organizations: Not on file    Relationship status: Not on file  . Intimate partner violence:    Fear of current or ex partner: Not on file    Emotionally abused: Not on file    Physically abused: Not on file    Forced sexual activity: Not on file  Other Topics Concern  . Not on file  Social History Narrative   Lives at home with her son.   Right-handed.   1 cup coffee per day.      PHYSICAL EXAM  Vitals:   07/03/18 1428  BP: (!) 147/72  Pulse: (!) 52  Weight: 196 lb 6.4 oz (89.1 kg)  Height: 5\' 1"  (1.549 m)   Body mass index is 37.11 kg/m.  Generalized: Well developed, in no acute distress   Neurological examination  Mentation: Alert oriented to time, place, history taking. Follows all commands speech and language fluent Cranial nerve II-XII: Pupils were equal round reactive to light. Extraocular movements were full,  visual field were full on confrontational test. Facial sensation and strength were normal. Uvula tongue midline. Head turning and shoulder shrug  were normal and symmetric. Motor: The motor testing reveals mild right arm and leg weakness Sensory: Sensory testing is intact to soft touch on all 4 extremities. No evidence of extinction is noted.  Coordination: Cerebellar testing reveals good finger-nose-finger and heel-to-shin bilaterally.  Gait and station: Gait is wide-based, mildly unsteady. Tandem gait is mildly unsteady.  Reflexes: Deep tendon reflexes are symmetric and normal bilaterally.   DIAGNOSTIC DATA (LABS, IMAGING, TESTING) - I reviewed patient records, labs, notes, testing and imaging myself where available.  Lab Results  Component Value Date   WBC 2.9 (L) 02/11/2018   HGB 13.7 02/11/2018   HCT 41.8 02/11/2018   MCV 96.5 02/11/2018   PLT 211 02/11/2018      Component Value Date/Time   NA 138 12/03/2017 1444   NA 142 08/19/2014 0822   K 4.6 12/03/2017 1444   K 4.1 08/19/2014 0822   CL 106 12/03/2017 1444   CL 109 08/19/2014 0822   CO2 27 12/03/2017 1444   CO2 26 08/19/2014 0822   GLUCOSE 81 12/03/2017 1444   GLUCOSE 110 (H) 08/19/2014 0822   BUN 22 12/03/2017 1444   BUN 16 08/19/2014 0822   CREATININE 1.33 (H) 12/03/2017 1444   CALCIUM 10.0 12/03/2017 1444   CALCIUM 10.2 08/19/2014 0822   PROT 7.2 12/03/2017 1444   PROT 7.3 07/24/2014 0915   ALBUMIN 4.2 11/19/2016 1141   ALBUMIN 4.1 07/24/2014 0915   AST 13 12/03/2017 1444   AST 31 07/24/2014 0915   ALT 8 12/03/2017 1444   ALT 29 07/24/2014 0915   ALKPHOS 64 11/19/2016 1141   ALKPHOS 53 07/24/2014 0915   BILITOT 0.4 12/03/2017 1444   BILITOT 0.9  07/24/2014 0915   GFRNONAA 41 (L) 12/03/2017 1444   GFRAA 47 (L) 12/03/2017 1444   Lab Results  Component Value Date   CHOL 142 12/03/2017   HDL 62 12/03/2017   LDLCALC 67 12/03/2017   TRIG 53 12/03/2017   CHOLHDL 2.3 12/03/2017   Lab Results  Component  Value Date   HGBA1C 4.9 12/03/2017   No results found for: EXBMWUXL24 Lab Results  Component Value Date   TSH 0.69 12/03/2017      ASSESSMENT AND PLAN 68 y.o. year old female  has a past medical history of CAD (coronary artery disease), Carotid stenosis, CHF (congestive heart failure) ([place]), CKD (chronic kidney disease), stage III ([place]), Diabetes mellitus ([place]), Family hx of colon cancer (06/13/2018), Gout, Hypertension, Orthostatic hypotension, Osteopenia (01/24/2018), PUD (peptic ulcer disease), PVC's (premature ventricular contractions), Seizures ([place]), Sinus bradycardia, Stroke ([place]), and Syncope. here with:  1.  Complex partial seizure  She reports having a mild seizure episode in December.  At that time she had decreased her Keppra in the evening.  I advised her to increase her dose back to as prescribed. She will continue taking Keppra 500 mg 1 in the morning, 2 tablet at bedtime.  She does not drive.  She has history of stroke and will continue taking aspirin daily.  She asked me to send in a prescription for her aspirin which was done. She will follow-up in 6 months or sooner if needed.  I advised her that if her symptoms worsen or she develops any new symptoms she should let us know.  I spent 15 minutes with the patient. 50% of this time was spent discussing her plan of care   Butler Denmark, AGNP-C, DNP 07/03/2018, 3:12 PM [facility] [address] [place], [place] [address] [phone]  I reviewed the chart, agree above,

## 2018-07-03 ENCOUNTER — Ambulatory Visit (INDEPENDENT_AMBULATORY_CARE_PROVIDER_SITE_OTHER): Payer: Medicare Other | Admitting: Neurology

## 2018-07-03 ENCOUNTER — Encounter: Payer: Self-pay | Admitting: Neurology

## 2018-07-03 VITALS — BP 147/72 | HR 52 | Ht 61.0 in | Wt 196.4 lb

## 2018-07-03 DIAGNOSIS — R569 Unspecified convulsions: Secondary | ICD-10-CM | POA: Diagnosis not present

## 2018-07-03 MED ORDER — LEVETIRACETAM 500 MG PO TABS: ORAL_TABLET | ORAL | 4 refills | Status: DC

## 2018-07-03 MED ORDER — ASPIRIN 81 MG PO TBEC: 81.0000 mg | DELAYED_RELEASE_TABLET | Freq: Every day | ORAL | 11 refills | Status: DC

## 2018-07-03 NOTE — Patient Instructions (Signed)
Please start taking Keppra as prescribed. Take Keppra 500 mg one tablet in the morning, take 2 tablets at bedtime.

## 2018-07-15 ENCOUNTER — Ambulatory Visit: Payer: Medicare Other | Admitting: Nurse Practitioner

## 2018-07-15 ENCOUNTER — Ambulatory Visit: Payer: Medicare Other

## 2018-07-15 NOTE — Progress Notes (Deleted)
Name: Leslie Houston MRN: [id number] DOB: [birth year] Date:07/15/2018 Progress Note Subjective Chief Complaint No chief complaint on file.  HPI Hypertension Amlodipine 5mg  day  Anxiety Celexa 10 mg and klonopin 1mg    Seizures  Keppra 500mg  Patient Active Problem List   Diagnosis Date Noted  . Family hx of colon cancer 06/13/2018  . Osteopenia 01/24/2018  . Anxiety 12/03/2017  . Need for vaccination with 13-polyvalent pneumococcal conjugate vaccine 04/18/2016  . Neutropenia ([place]) 12/31/2015  . Encounter for medication monitoring 12/30/2015  . Need for hepatitis C screening test 12/30/2015  . Visit for screening mammogram 12/30/2015  . Seizures ([place])   . Carotid stenosis   . PVC's (premature ventricular contractions)   . Diabetes mellitus ([place])   . Pain in the chest   . Urinary retention 08/20/2014  . History of stroke 08/19/2014  . CKD (chronic kidney disease) stage 3, GFR 30-59 ml/min (H[facility]) 08/19/2014  . OBESITY 03/04/2006  . ANXIETY 03/04/2006  . Essential hypertension 03/04/2006  . GERD 03/04/2006  . PEPTIC ULCER DISEASE 03/04/2006  . MENOPAUSAL SYNDROME 03/04/2006  . HIP PAIN, LEFT 03/04/2006  . KNEE PAIN 03/04/2006   Past Medical History:  Diagnosis Date  . CAD (coronary artery disease)    a. Pt reports in 2000 she had a stent to a coronary artery, details unclear, outside hospital.  . Carotid stenosis    a. CT angio head 07/2014 - 50-60% stenoses of prox ICA bilaterally.  . CHF (congestive heart failure) ([place])    a. Pt reports in 2000 she had CHF, details unclear, outside hospital.  . CKD (chronic kidney disease), stage III ([place])   . Diabetes mellitus ([place])   . Family hx of colon cancer 06/13/2018   Father diagnosed in his 37's  . Gout   . Hypertension   . Orthostatic hypotension   . Osteopenia 01/24/2018   DEXA Sept 2019  . PUD (peptic ulcer disease)   . PVC's (premature ventricular contractions)   . Seizures ([place])   . Sinus bradycardia   . Stroke  [facility])    a. L PCA CVA in 05/2014.  [name] Syncope    Past Surgical History:  Procedure Laterality Date  . CESAREAN SECTION    . DENTAL SURGERY    . Heart stent    . stint     Social History   Tobacco Use  . Smoking status: Former Smoker    Last attempt to quit: 03/30/2014    Years since quitting: 4.2  . Smokeless tobacco: Never Used  Substance Use Topics  . Alcohol use: No    Current Outpatient Medications:  .  amLODipine (NORVASC) 5 MG tablet, TAKE 1 TABLET BY MOUTH ONCE DAILY, Disp: 90 tablet, Rfl: 1 .  aspirin (ASPIR-LOW) 81 MG EC tablet, Take 1 tablet (81 mg total) by mouth daily. Swallow whole., Disp: 30 tablet, Rfl: 11 .  atorvastatin (LIPITOR) 10 MG tablet, TAKE 1 TABLET BY MOUTH EVERY NIGHT AT BEDTIME (Patient not taking: Reported on 07/03/2018), Disp: 30 tablet, Rfl: 1 .  citalopram (CELEXA) 20 MG tablet, Take 1 tablet (20 mg total) by mouth daily., Disp: 30 tablet, Rfl: 3 .  fluticasone (FLONASE) 50 MCG/ACT nasal spray, Place 2 sprays into both nostrils daily as needed for allergies (allergies). , Disp: , Rfl:  .  levETIRAcetam (KEPPRA) 500 MG tablet, One in the morning, 2 tabs at night, Disp: 270 tablet, Rfl: 4 .  loratadine (CLARITIN) 10 MG tablet, TAKE 1 TABLET BY MOUTH DAILY AS NEEDED FOR  ALLERGIES., Disp: 90 tablet, Rfl: 3 .  UNABLE TO FIND, Med Name: eye drops for dry eye, Disp: , Rfl:  No Known Allergies ROS *** No other specific complaints in a complete review of systems (except as listed in HPI above). Objective There were no vitals filed for this visit. *** There is no height or weight on file to calculate BMI. Nursing Note and Vital Signs reviewed. Physical Exam *** ? No results found for this or any previous visit (from the past 48 hour(s)). Assessment & Plan There are no diagnoses linked to this encounter. ? -Red flags and when to present for emergency care or RTC including fever >101.65F, chest pain, shortness of breath, new/worsening/un-resolving  symptoms, *** reviewed with patient at time of visit. Follow up and care instructions discussed and provided in AVS. -Reviewed Health Maintenance: ***

## 2018-07-22 ENCOUNTER — Ambulatory Visit: Payer: Medicare Other | Admitting: Family Medicine

## 2018-07-31 ENCOUNTER — Other Ambulatory Visit: Payer: Self-pay | Admitting: Family Medicine

## 2018-08-13 ENCOUNTER — Other Ambulatory Visit: Payer: Self-pay

## 2018-08-14 MED ORDER — ATORVASTATIN CALCIUM 10 MG PO TABS: 10.0000 mg | ORAL_TABLET | Freq: Every day | ORAL | 1 refills | Status: DC

## 2018-08-14 NOTE — Telephone Encounter (Signed)
Lab Results  Component Value Date   ALT 8 12/03/2017   ALT 29 07/24/2014   Lab Results  Component Value Date   CHOL 142 12/03/2017   HDL 62 12/03/2017   LDLCALC 67 12/03/2017   TRIG 53 12/03/2017   CHOLHDL 2.3 12/03/2017

## 2018-09-15 ENCOUNTER — Ambulatory Visit: Payer: Medicare Other | Admitting: Neurology

## 2018-09-23 ENCOUNTER — Telehealth: Payer: Self-pay

## 2018-09-23 NOTE — Telephone Encounter (Signed)
Leslie Houston has it and will give it to you.

## 2018-09-23 NOTE — Telephone Encounter (Signed)
Copied from [place] [phone]. Topic: General - Inquiry >> Sep 18, 2018  2:19 PM Vernona Rieger wrote: Reason for CRM: [name] from [place] would like to know if the office received his fax today. Call back is [phone] >> Sep 23, 2018  9:25 AM Sharene Skeans wrote: [name] from [place] has re faxed over a PA for medical equipment and needs this faxed back today.

## 2018-09-23 NOTE — Telephone Encounter (Signed)
Can you find out if we received the fax and bring it to myself or Benjamine Mola please?

## 2018-09-24 NOTE — Telephone Encounter (Signed)
Great - thanks for the update.  I can take a look at those tomorrow.

## 2018-09-24 NOTE — Telephone Encounter (Signed)
Not urgent, Leslie Houston took care of already.  2 forms one for wrist braces, tried to call patient about.  The other for diabetic testing supplies which Leslie Houston states she does not need.

## 2018-09-25 NOTE — Telephone Encounter (Signed)
Leslie Houston is calling back to check the status   Tomorrow will be the last day for this. Please provide the NPI number of the signing provider as well.

## 2018-09-29 ENCOUNTER — Ambulatory Visit: Payer: Self-pay

## 2018-09-29 NOTE — Telephone Encounter (Signed)
Pt c/o bilateral lower leg edema that started 3 days ago. Left leg is more swollen than the right leg. Pt denies any redness or pain. Pt stated that after she rests the edema improves. Pt stated that she thinks the edema is due to keeping her legs dependent. Pt has h/o CHF. Pt stated that she feels good and is having no SOB or chest pain. Care advice given and pt verbalized understanding. Routing note to office to schedule virtual visit.   Reason for Disposition . [1] MODERATE leg swelling (e.g., swelling extends up to knees) AND [2] new onset or worsening  Answer Assessment - Initial Assessment Questions 1. ONSET: "When did the swelling start?" (e.g., minutes, hours, days)     3 days ago 2. LOCATION: "What part of the leg is swollen?"  "Are both legs swollen or just one leg?"     Knee to foot- left greater than right leg 3. SEVERITY: "How bad is the swelling?" (e.g., localized; mild, moderate, severe)  - Localized - small area of swelling localized to one leg  - MILD pedal edema - swelling limited to foot and ankle, pitting edema < 1/4 inch (6 mm) deep, rest and elevation eliminate most or all swelling  - MODERATE edema - swelling of lower leg to knee, pitting edema > 1/4 inch (6 mm) deep, rest and elevation only partially reduce swelling  - SEVERE edema - swelling extends above knee, facial or hand swelling present      moderate 4. REDNESS: "Does the swelling look red or infected?"     no 5. PAIN: "Is the swelling painful to touch?" If so, ask: "How painful is it?"   (Scale 1-10; mild, moderate or severe)     Feels tight-no 6. FEVER: "Do you have a fever?" If so, ask: "What is it, how was it measured, and when did it start?"      no 7. CAUSE: "What do you think is causing the leg swelling?"     Keeping legs dependent 8. MEDICAL HISTORY: "Do you have a history of heart failure, kidney disease, liver failure, or cancer?"     CHF 9. RECURRENT SYMPTOM: "Have you had leg swelling before?" If  so, ask: "When was the last time?" "What happened that time?"     Yes-comes and goes- could not tell me when last - ice pack applied and freeze it  10. OTHER SYMPTOMS: "Do you have any other symptoms?" (e.g., chest pain, difficulty breathing)       no 11. PREGNANCY: "Is there any chance you are pregnant?" "When was your last menstrual period?"       n/a  Protocols used: LEG SWELLING AND EDEMA-A-AH

## 2018-09-30 NOTE — Telephone Encounter (Signed)
Called pt to her scheduled and she stated she had to call back after she finds out when her ride can bring her.

## 2018-10-01 NOTE — Telephone Encounter (Signed)
Leslie Houston, from [name], called regarding this paperwork. Please advise.

## 2018-10-02 NOTE — Telephone Encounter (Signed)
It looks like Benjamine Mola took care of this.  Please provide NPI number/orders per Benjamine Mola.

## 2018-10-02 NOTE — Telephone Encounter (Signed)
[name] notified these have been denied

## 2018-10-08 ENCOUNTER — Ambulatory Visit (INDEPENDENT_AMBULATORY_CARE_PROVIDER_SITE_OTHER): Payer: Medicare Other | Admitting: Nurse Practitioner

## 2018-10-08 ENCOUNTER — Encounter: Payer: Self-pay | Admitting: Nurse Practitioner

## 2018-10-08 VITALS — BP 135/76 | HR 54 | Ht 61.0 in | Wt 187.0 lb

## 2018-10-08 DIAGNOSIS — M25476 Effusion, unspecified foot: Secondary | ICD-10-CM | POA: Diagnosis not present

## 2018-10-08 DIAGNOSIS — M25473 Effusion, unspecified ankle: Secondary | ICD-10-CM | POA: Diagnosis not present

## 2018-10-08 DIAGNOSIS — N183 Chronic kidney disease, stage 3 unspecified: Secondary | ICD-10-CM

## 2018-10-08 DIAGNOSIS — M25471 Effusion, right ankle: Secondary | ICD-10-CM

## 2018-10-08 DIAGNOSIS — M109 Gout, unspecified: Secondary | ICD-10-CM

## 2018-10-08 DIAGNOSIS — M25475 Effusion, left foot: Secondary | ICD-10-CM

## 2018-10-08 MED ORDER — COLCHICINE 0.6 MG PO TABS: ORAL_TABLET | ORAL | 0 refills | Status: DC

## 2018-10-08 NOTE — Progress Notes (Signed)
Virtual Visit via Telephone Note  I connected with Leslie Houston on 10/08/18 at  4:00 PM EDT by telephone and verified that I am speaking with the correct person using two identifiers.   Staff discussed the limitations, risks, security and privacy concerns of performing an evaluation and management service by telephone and the availability of in person appointments. Staff also discussed with the patient that there may be a patient responsible charge related to this service. The patient expressed understanding and agreed to proceed.  Patients location: home My location: work office  Other people in meeting: none    HPI  Patient endorses bilateral foot swelling, states started in left leg 2 weeks ago and then a few days later went to right foot. States bilateral ankles and foot are swollen. States improved with ice and elevation and improved. States when she elevates it at night it is back to normal in the mornings. States her ankles feel tight. Denies shortness of breath, cough, orthopnea.   Patient had fish multiple times last week and some fat back. Thinks it might be gout as she has had a flare before that presented with similar swelling.   PHQ2/9: Depression screen [facility] 2/9 10/08/2018 12/03/2017 11/19/2016 04/18/2016 12/30/2015  Decreased Interest 0 0 0 0 0  Down, Depressed, Hopeless 0 0 0 0 0  PHQ - 2 Score 0 0 0 0 0  Altered sleeping 0 2 - - -  Tired, decreased energy 0 2 - - -  Change in appetite 0 0 - - -  Feeling bad or failure about yourself  0 0 - - -  Trouble concentrating 0 0 - - -  Moving slowly or fidgety/restless 0 0 - - -  Suicidal thoughts 0 0 - - -  PHQ-9 Score 0 4 - - -  Difficult doing work/chores Not difficult at all Not difficult at all - - -     PHQ reviewed. Negative  Patient Active Problem List   Diagnosis Date Noted  . Family hx of colon cancer 06/13/2018  . Osteopenia 01/24/2018  . Anxiety 12/03/2017  . Need for vaccination with 13-polyvalent  pneumococcal conjugate vaccine 04/18/2016  . Neutropenia ([place]) 12/31/2015  . Encounter for medication monitoring 12/30/2015  . Need for hepatitis C screening test 12/30/2015  . Visit for screening mammogram 12/30/2015  . Seizures ([place])   . Carotid stenosis   . PVC's (premature ventricular contractions)   . Diabetes mellitus ([place])   . Pain in the chest   . Urinary retention 08/20/2014  . History of stroke 08/19/2014  . CKD (chronic kidney disease) stage 3, GFR 30-59 ml/min (HCC) 08/19/2014  . OBESITY 03/04/2006  . ANXIETY 03/04/2006  . Essential hypertension 03/04/2006  . GERD 03/04/2006  . PEPTIC ULCER DISEASE 03/04/2006  . MENOPAUSAL SYNDROME 03/04/2006  . HIP PAIN, LEFT 03/04/2006  . KNEE PAIN 03/04/2006    Past Medical History:  Diagnosis Date  . CAD (coronary artery disease)    a. Pt reports in 2000 she had a stent to a coronary artery, details unclear, outside hospital.  . Carotid stenosis    a. CT angio head 07/2014 - 50-60% stenoses of prox ICA bilaterally.  . CHF (congestive heart failure) ([place])    a. Pt reports in 2000 she had CHF, details unclear, outside hospital.  . CKD (chronic kidney disease), stage III ([place])   . Diabetes mellitus ([place])   . Family hx of colon cancer 06/13/2018   Father diagnosed in his 71's  .  Gout   . Hypertension   . Orthostatic hypotension   . Osteopenia 01/24/2018   DEXA Sept 2019  . PUD (peptic ulcer disease)   . PVC's (premature ventricular contractions)   . Seizures ([place])   . Sinus bradycardia   . Stroke [facility])    a. L PCA CVA in 05/2014.  [name] Syncope     Past Surgical History:  Procedure Laterality Date  . CESAREAN SECTION    . DENTAL SURGERY    . Heart stent    . stint      Social History   Tobacco Use  . Smoking status: Former Smoker    Last attempt to quit: 03/30/2014    Years since quitting: 4.5  . Smokeless tobacco: Never Used  Substance Use Topics  . Alcohol use: No     Current Outpatient Medications:  .   amLODipine (NORVASC) 5 MG tablet, TAKE 1 TABLET BY MOUTH ONCE DAILY, Disp: 90 tablet, Rfl: 1 .  aspirin (ASPIR-LOW) 81 MG EC tablet, Take 1 tablet (81 mg total) by mouth daily. Swallow whole., Disp: 30 tablet, Rfl: 11 .  atorvastatin (LIPITOR) 10 MG tablet, Take 1 tablet (10 mg total) by mouth at bedtime., Disp: 30 tablet, Rfl: 1 .  citalopram (CELEXA) 20 MG tablet, TAKE 1 TABLET BY MOUTH ONCE DAILY, Disp: 90 tablet, Rfl: 1 .  fluticasone (FLONASE) 50 MCG/ACT nasal spray, Place 2 sprays into both nostrils daily as needed for allergies (allergies). , Disp: , Rfl:  .  levETIRAcetam (KEPPRA) 500 MG tablet, One in the morning, 2 tabs at night, Disp: 270 tablet, Rfl: 4 .  loratadine (CLARITIN) 10 MG tablet, TAKE 1 TABLET BY MOUTH DAILY AS NEEDED FOR ALLERGIES., Disp: 90 tablet, Rfl: 3 .  UNABLE TO FIND, Med Name: eye drops for dry eye, Disp: , Rfl:   No Known Allergies  ROS   No other specific complaints in a complete review of systems (except as listed in HPI above).  Objective  Vitals:   10/08/18 1307  BP: 135/76  Pulse: (!) 54  Weight: 187 lb (84.8 kg)  Height: 5\' 1"  (1.549 m)     Body mass index is 35.33 kg/m.  Patient is alert, able to speak in full sentences without difficulty.   Assessment & Plan  1. Bilateral swelling of feet and ankles Low sodium, elevation, follow-up in office  - Compression stockings  2. CKD (chronic kidney disease) stage 3, GFR 30-59 ml/min (HCC) Hydration, avoids nsaids   3. Acute gout of left foot, unspecified cause Stop atorvastatin for 3 days  - colchicine 0.6 MG tablet; Take 2 tablets today to resolve pain can take an additional tablet in one hour if needed. STOP ATORVASTATIN for 3 days.  Dispense: 3 tablet; Refill: 0     I discussed the assessment and treatment plan with the patient. The patient was provided an opportunity to ask questions and all were answered. The patient agreed with the plan and demonstrated an understanding of the  instructions.   The patient was advised to call back or seek an in-person evaluation if the symptoms worsen or if the condition fails to improve as anticipated.  I provided 3minutes of non-face-to-face time during this encounter.   Fredderick Severance, NP

## 2018-10-09 ENCOUNTER — Other Ambulatory Visit: Payer: Self-pay

## 2018-10-09 MED ORDER — ATORVASTATIN CALCIUM 10 MG PO TABS: 10.0000 mg | ORAL_TABLET | Freq: Every day | ORAL | 0 refills | Status: DC

## 2018-10-10 ENCOUNTER — Ambulatory Visit: Payer: Medicare Other | Admitting: Family Medicine

## 2018-10-14 ENCOUNTER — Other Ambulatory Visit: Payer: Self-pay | Admitting: Family Medicine

## 2018-10-14 DIAGNOSIS — M109 Gout, unspecified: Secondary | ICD-10-CM

## 2018-10-14 MED ORDER — COLCHICINE 0.6 MG PO TABS: ORAL_TABLET | ORAL | 0 refills | Status: DC

## 2018-10-14 NOTE — Telephone Encounter (Signed)
Pt called stating the medication helped her a lot, but she was wondering if she could have a refill on,colchicine 0.6 MG tablet,  because she is still experiencing some pain in her heel. Please advise .   [place], [place]  [place] [address]  Phone: [phone] Fax: [phone]  Not a 24 hour pharmacy; exact hours not known.

## 2018-11-12 ENCOUNTER — Ambulatory Visit (INDEPENDENT_AMBULATORY_CARE_PROVIDER_SITE_OTHER): Payer: Medicare Other | Admitting: Nurse Practitioner

## 2018-11-12 ENCOUNTER — Encounter: Payer: Self-pay | Admitting: Nurse Practitioner

## 2018-11-12 ENCOUNTER — Other Ambulatory Visit: Payer: Self-pay

## 2018-11-12 VITALS — BP 137/80 | HR 59 | Temp 97.0°F

## 2018-11-12 DIAGNOSIS — N183 Chronic kidney disease, stage 3 unspecified: Secondary | ICD-10-CM

## 2018-11-12 DIAGNOSIS — M8580 Other specified disorders of bone density and structure, unspecified site: Secondary | ICD-10-CM | POA: Diagnosis not present

## 2018-11-12 DIAGNOSIS — I6523 Occlusion and stenosis of bilateral carotid arteries: Secondary | ICD-10-CM

## 2018-11-12 DIAGNOSIS — D709 Neutropenia, unspecified: Secondary | ICD-10-CM

## 2018-11-12 DIAGNOSIS — R609 Edema, unspecified: Secondary | ICD-10-CM

## 2018-11-12 DIAGNOSIS — I129 Hypertensive chronic kidney disease with stage 1 through stage 4 chronic kidney disease, or unspecified chronic kidney disease: Secondary | ICD-10-CM | POA: Diagnosis not present

## 2018-11-12 DIAGNOSIS — E119 Type 2 diabetes mellitus without complications: Secondary | ICD-10-CM

## 2018-11-12 DIAGNOSIS — I10 Essential (primary) hypertension: Secondary | ICD-10-CM

## 2018-11-12 DIAGNOSIS — R569 Unspecified convulsions: Secondary | ICD-10-CM

## 2018-11-12 MED ORDER — LOSARTAN POTASSIUM-HCTZ 50-12.5 MG PO TABS: 0.5000 | ORAL_TABLET | Freq: Every day | ORAL | 0 refills | Status: DC

## 2018-11-12 MED ORDER — AMLODIPINE BESYLATE 5 MG PO TABS: 5.0000 mg | ORAL_TABLET | Freq: Every day | ORAL | 1 refills | Status: DC

## 2018-11-12 NOTE — Progress Notes (Signed)
Virtual Visit via Telephone Note  I connected with Leslie Houston on 11/12/18 at  9:40 AM EDT by telephone and verified that I am speaking with the correct person using two identifiers.   Staff discussed the limitations, risks, security and privacy concerns of performing an evaluation and management service by telephone and the availability of in person appointments. Staff also discussed with the patient that there may be a patient responsible charge related to this service. The patient expressed understanding and agreed to proceed.  Patients location: home  My location: work office  Other people in meeting: none    HPI Dependent edema Patient states ankle swelling has improved with elevation. She tried compression stocking but broke out due to material. Does drink some soda but states doesn't add salt to foods.   Hyperlipidemia Patient rx atorvastatin 10mg  nightly. She does not take them due to cramping pain.  Diet:  Denies myalgias Lab Results  Component Value Date   CHOL 142 12/03/2017   HDL 62 12/03/2017   LDLCALC 67 12/03/2017   TRIG 53 12/03/2017   CHOLHDL 2.3 12/03/2017   Hypertension Patient is on amlodipine 5mg  daily. Takes medications as prescribed with no missed doses a month.  She iscompliant with low-salt diet.  She checks blood pressures at home with range of  Denies chest pain, headaches, blurry vision.  BP Readings from Last 3 Encounters:  11/12/18 137/80  10/08/18 135/76  07/03/18 (!) 147/72   Diabetes Mellitus Patient is diet controlled, improved with weight loss.  Denies polyphagia, polydipsia, polyuria.  Stopped statin Not on ARB or ACEi Does check feet- no ulcers just mild swelling.  Lab Results  Component Value Date   HGBA1C 4.9 12/03/2017   Seizures She is taking her keppra as prescribed, follows up with neurology. States it has been awhile since her last seizure- reviewed neurology note- last reported December 2019.  Neutropenia Saw Dr.  Grayland Ormond- noted likely due to [place]- no intervention needed at this time, routinely monitor.  PHQ2/9: Depression screen [facility] 2/9 11/12/2018 10/08/2018 12/03/2017 11/19/2016 04/18/2016  Decreased Interest 0 0 0 0 0  Down, Depressed, Hopeless 0 0 0 0 0  PHQ - 2 Score 0 0 0 0 0  Altered sleeping 0 0 2 - -  Tired, decreased energy 0 0 2 - -  Change in appetite 0 0 0 - -  Feeling bad or failure about yourself  0 0 0 - -  Trouble concentrating 0 0 0 - -  Moving slowly or fidgety/restless 0 0 0 - -  Suicidal thoughts 0 0 0 - -  PHQ-9 Score 0 0 4 - -  Difficult doing work/chores Not difficult at all Not difficult at all Not difficult at all - -     PHQ reviewed. Negative  Patient Active Problem List   Diagnosis Date Noted  . Osteopenia 01/24/2018  . Anxiety 12/03/2017  . Neutropenia ([place]) 12/31/2015  . Seizures ([place])   . Carotid stenosis   . PVC's (premature ventricular contractions)   . Diabetes mellitus ([place])   . Pain in the chest   . Urinary retention 08/20/2014  . History of stroke 08/19/2014  . CKD (chronic kidney disease) stage 3, GFR 30-59 ml/min (HCC) 08/19/2014  . OBESITY 03/04/2006  . ANXIETY 03/04/2006  . Essential hypertension 03/04/2006  . GERD 03/04/2006  . PEPTIC ULCER DISEASE 03/04/2006  . MENOPAUSAL SYNDROME 03/04/2006  . HIP PAIN, LEFT 03/04/2006  . KNEE PAIN 03/04/2006    Past Medical History:  Diagnosis Date  . CAD (coronary artery disease)    a. Pt reports in 2000 she had a stent to a coronary artery, details unclear, outside hospital.  . Carotid stenosis    a. CT angio head 07/2014 - 50-60% stenoses of prox ICA bilaterally.  . CHF (congestive heart failure) ([place])    a. Pt reports in 2000 she had CHF, details unclear, outside hospital.  . CKD (chronic kidney disease), stage III ([place])   . Diabetes mellitus ([place])   . Family hx of colon cancer 06/13/2018   Father diagnosed in his 29's  . Gout   . Hypertension   . Orthostatic hypotension   . Osteopenia  01/24/2018   DEXA Sept 2019  . PUD (peptic ulcer disease)   . PVC's (premature ventricular contractions)   . Seizures ([place])   . Sinus bradycardia   . Stroke [facility])    a. L PCA CVA in 05/2014.  [name] Syncope     Past Surgical History:  Procedure Laterality Date  . CESAREAN SECTION    . DENTAL SURGERY    . Heart stent    . stint      Social History   Tobacco Use  . Smoking status: Former Smoker    Quit date: 03/30/2014    Years since quitting: 4.6  . Smokeless tobacco: Never Used  Substance Use Topics  . Alcohol use: No     Current Outpatient Medications:  .  amLODipine (NORVASC) 5 MG tablet, TAKE 1 TABLET BY MOUTH ONCE DAILY, Disp: 90 tablet, Rfl: 1 .  aspirin (ASPIR-LOW) 81 MG EC tablet, Take 1 tablet (81 mg total) by mouth daily. Swallow whole., Disp: 30 tablet, Rfl: 11 .  citalopram (CELEXA) 20 MG tablet, TAKE 1 TABLET BY MOUTH ONCE DAILY, Disp: 90 tablet, Rfl: 1 .  colchicine 0.6 MG tablet, Take 2 tablets today to resolve pain can take an additional tablet in one hour if needed. STOP ATORVASTATIN for 3 days., Disp: 3 tablet, Rfl: 0 .  fluticasone (FLONASE) 50 MCG/ACT nasal spray, Place 2 sprays into both nostrils daily as needed for allergies (allergies). , Disp: , Rfl:  .  levETIRAcetam (KEPPRA) 500 MG tablet, One in the morning, 2 tabs at night, Disp: 270 tablet, Rfl: 4 .  loratadine (CLARITIN) 10 MG tablet, TAKE 1 TABLET BY MOUTH DAILY AS NEEDED FOR ALLERGIES., Disp: 90 tablet, Rfl: 3 .  UNABLE TO FIND, Med Name: eye drops for dry eye, Disp: , Rfl:  .  atorvastatin (LIPITOR) 10 MG tablet, Take 1 tablet (10 mg total) by mouth at bedtime. Do not restart until 3 days have past (10/12/2018) (Patient not taking: Reported on 11/12/2018), Disp: 90 tablet, Rfl: 0  No Known Allergies  ROS    No other specific complaints in a complete review of systems (except as listed in HPI above).  Objective  Vitals:   11/12/18 0920  BP: 137/80  Pulse: (!) 59  Temp: (!) 97 F (36.1 C)      There is no height or weight on file to calculate BMI.  Patient is alert, able to speak in full sentences without difficulty.   Assessment & Plan  1. Essential hypertension Stop amlodipine due to lower extremity swelling- will start on low-dose losartan-HCTZ- for nephroprotection and to promote diuresis  - COMPLETE METABOLIC PANEL WITH GFR  2. Type 2 diabetes mellitus without complication, without long-term current use of insulin (HCC) - HgB A1c - COMPLETE METABOLIC PANEL WITH GFR - Urine Microalbumin w/creat.  ratio  3. Osteopenia, unspecified location Discussed supplementation   4. CKD (chronic kidney disease) stage 3, GFR 30-59 ml/min (HCC) Will monitor- put on ARB for nephroprotection  - COMPLETE METABOLIC PANEL WITH GFR  5. Dependent edema elevation - COMPLETE METABOLIC PANEL WITH GFR  6. Neutropenia, unspecified type ([place]) - CBC with Differential  7. Seizures ([place]) Follows up with neurology, continue to take keppra   8. Bilateral carotid artery stenosis Will recheck and discuss trial of various statin - Lipid Profile    I discussed the assessment and treatment plan with the patient. The patient was provided an opportunity to ask questions and all were answered. The patient agreed with the plan and demonstrated an understanding of the instructions.   The patient was advised to call back or seek an in-person evaluation if the symptoms worsen or if the condition fails to improve as anticipated.  I provided 28 minutes of non-face-to-face time during this encounter.   Fredderick Severance, NP

## 2018-12-25 ENCOUNTER — Other Ambulatory Visit: Payer: Self-pay | Admitting: Family Medicine

## 2018-12-30 ENCOUNTER — Other Ambulatory Visit: Payer: Self-pay | Admitting: Nurse Practitioner

## 2019-01-01 ENCOUNTER — Ambulatory Visit (INDEPENDENT_AMBULATORY_CARE_PROVIDER_SITE_OTHER): Payer: Medicare Other

## 2019-01-01 VITALS — BP 135/82 | HR 57 | Ht 61.0 in

## 2019-01-01 DIAGNOSIS — Z0000 Encounter for general adult medical examination without abnormal findings: Secondary | ICD-10-CM | POA: Diagnosis not present

## 2019-01-01 DIAGNOSIS — Z598 Other problems related to housing and economic circumstances: Secondary | ICD-10-CM | POA: Diagnosis not present

## 2019-01-01 DIAGNOSIS — Z1231 Encounter for screening mammogram for malignant neoplasm of breast: Secondary | ICD-10-CM | POA: Diagnosis not present

## 2019-01-01 DIAGNOSIS — Z599 Problem related to housing and economic circumstances, unspecified: Secondary | ICD-10-CM
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
## 2019-01-01 NOTE — Patient Instructions (Signed)
Leslie Houston , Thank you for taking time to come for your Medicare Wellness Visit. I appreciate your ongoing commitment to your health goals. Please review the following plan we discussed and let me know if I can assist you in the future.   Screening recommendations/referrals: Colonoscopy: please repeat Cologuard test with adequate sample for tesitng Mammogram: done 02/12/18. Please call [phone] to schedule your mammogram.  Bone Density: done 01/23/18 Recommended yearly ophthalmology/optometry visit for glaucoma screening and checkup Recommended yearly dental visit for hygiene and checkup  Vaccinations: Influenza vaccine: postponed Pneumococcal vaccine: postponed Tdap vaccine: postponed Shingles vaccine: Shingrix discussed. Please contact your pharmacy for coverage information.   Advanced directives: Advance directive discussed with you today. I have provided a copy for you to complete at home and have notarized. Once this is complete please bring a copy in to our office so we can scan it into your chart.  Conditions/risks identified: Recommend drinking 6-8 glasses of water per day.   Next appointment: Please follow up in one year for your Medicare Annual Wellness visit.     Preventive Care 91 Years and Older, Female Preventive care refers to lifestyle choices and visits with your health care provider that can promote health and wellness. What does preventive care include?  A yearly physical exam. This is also called an annual well check.  Dental exams once or twice a year.  Routine eye exams. Ask your health care provider how often you should have your eyes checked.  Personal lifestyle choices, including:  Daily care of your teeth and gums.  Regular physical activity.  Eating a healthy diet.  Avoiding tobacco and drug use.  Limiting alcohol use.  Practicing safe sex.  Taking low-dose aspirin every day.  Taking vitamin and mineral supplements as recommended by your  health care provider. What happens during an annual well check? The services and screenings done by your health care provider during your annual well check will depend on your age, overall health, lifestyle risk factors, and family history of disease. Counseling  Your health care provider may ask you questions about your:  Alcohol use.  Tobacco use.  Drug use.  Emotional well-being.  Home and relationship well-being.  Sexual activity.  Eating habits.  History of falls.  Memory and ability to understand (cognition).  Work and work Statistician.  Reproductive health. Screening  You may have the following tests or measurements:  Height, weight, and BMI.  Blood pressure.  Lipid and cholesterol levels. These may be checked every 5 years, or more frequently if you are over 79 years old.  Skin check.  Lung cancer screening. You may have this screening every year starting at age 62 if you have a 30-pack-year history of smoking and currently smoke or have quit within the past 15 years.  Fecal occult blood test (FOBT) of the stool. You may have this test every year starting at age 66.  Flexible sigmoidoscopy or colonoscopy. You may have a sigmoidoscopy every 5 years or a colonoscopy every 10 years starting at age 57.  Hepatitis C blood test.  Hepatitis B blood test.  Sexually transmitted disease (STD) testing.  Diabetes screening. This is done by checking your blood sugar (glucose) after you have not eaten for a while (fasting). You may have this done every 1-3 years.  Bone density scan. This is done to screen for osteoporosis. You may have this done starting at age 47.  Mammogram. This may be done every 1-2 years. Talk to your health  care provider about how often you should have regular mammograms. Talk with your health care provider about your test results, treatment options, and if necessary, the need for more tests. Vaccines  Your health care provider may recommend  certain vaccines, such as:  Influenza vaccine. This is recommended every year.  Tetanus, diphtheria, and acellular pertussis (Tdap, Td) vaccine. You may need a Td booster every 10 years.  Zoster vaccine. You may need this after age 58.  Pneumococcal 13-valent conjugate (PCV13) vaccine. One dose is recommended after age 39.  Pneumococcal polysaccharide (PPSV23) vaccine. One dose is recommended after age 51. Talk to your health care provider about which screenings and vaccines you need and how often you need them. This information is not intended to replace advice given to you by your health care provider. Make sure you discuss any questions you have with your health care provider. Document Released: 05/13/2015 Document Revised: 01/04/2016 Document Reviewed: 02/15/2015 Elsevier Interactive Patient Education  2017 [place] Prevention in the Home Falls can cause injuries. They can happen to people of all ages. There are many things you can do to make your home safe and to help prevent falls. What can I do on the outside of my home?  Regularly fix the edges of walkways and driveways and fix any cracks.  Remove anything that might make you trip as you walk through a door, such as a raised step or threshold.  Trim any bushes or trees on the path to your home.  Use bright outdoor lighting.  Clear any walking paths of anything that might make someone trip, such as rocks or tools.  Regularly check to see if handrails are loose or broken. Make sure that both sides of any steps have handrails.  Any raised decks and porches should have guardrails on the edges.  Have any leaves, snow, or ice cleared regularly.  Use sand or salt on walking paths during winter.  Clean up any spills in your garage right away. This includes oil or grease spills. What can I do in the bathroom?  Use night lights.  Install grab bars by the toilet and in the tub and shower. Do not use towel bars as  grab bars.  Use non-skid mats or decals in the tub or shower.  If you need to sit down in the shower, use a plastic, non-slip stool.  Keep the floor dry. Clean up any water that spills on the floor as soon as it happens.  Remove soap buildup in the tub or shower regularly.  Attach bath mats securely with double-sided non-slip rug tape.  Do not have throw rugs and other things on the floor that can make you trip. What can I do in the bedroom?  Use night lights.  Make sure that you have a light by your bed that is easy to reach.  Do not use any sheets or blankets that are too big for your bed. They should not hang down onto the floor.  Have a firm chair that has side arms. You can use this for support while you get dressed.  Do not have throw rugs and other things on the floor that can make you trip. What can I do in the kitchen?  Clean up any spills right away.  Avoid walking on wet floors.  Keep items that you use a lot in easy-to-reach places.  If you need to reach something above you, use a strong step stool that has a grab  bar.  Keep electrical cords out of the way.  Do not use floor polish or wax that makes floors slippery. If you must use wax, use non-skid floor wax.  Do not have throw rugs and other things on the floor that can make you trip. What can I do with my stairs?  Do not leave any items on the stairs.  Make sure that there are handrails on both sides of the stairs and use them. Fix handrails that are broken or loose. Make sure that handrails are as long as the stairways.  Check any carpeting to make sure that it is firmly attached to the stairs. Fix any carpet that is loose or worn.  Avoid having throw rugs at the top or bottom of the stairs. If you do have throw rugs, attach them to the floor with carpet tape.  Make sure that you have a light switch at the top of the stairs and the bottom of the stairs. If you do not have them, ask someone to add them  for you. What else can I do to help prevent falls?  Wear shoes that:  Do not have high heels.  Have rubber bottoms.  Are comfortable and fit you well.  Are closed at the toe. Do not wear sandals.  If you use a stepladder:  Make sure that it is fully opened. Do not climb a closed stepladder.  Make sure that both sides of the stepladder are locked into place.  Ask someone to hold it for you, if possible.  Clearly mark and make sure that you can see:  Any grab bars or handrails.  First and last steps.  Where the edge of each step is.  Use tools that help you move around (mobility aids) if they are needed. These include:  Canes.  Walkers.  Scooters.  Crutches.  Turn on the lights when you go into a dark area. Replace any light bulbs as soon as they burn out.  Set up your furniture so you have a clear path. Avoid moving your furniture around.  If any of your floors are uneven, fix them.  If there are any pets around you, be aware of where they are.  Review your medicines with your doctor. Some medicines can make you feel dizzy. This can increase your chance of falling. Ask your doctor what other things that you can do to help prevent falls. This information is not intended to replace advice given to you by your health care provider. Make sure you discuss any questions you have with your health care provider. Document Released: 02/10/2009 Document Revised: 09/22/2015 Document Reviewed: 05/21/2014 Elsevier Interactive Patient Education  2017 [employer].
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
## 2019-01-06 ENCOUNTER — Ambulatory Visit (INDEPENDENT_AMBULATORY_CARE_PROVIDER_SITE_OTHER): Payer: Medicare Other | Admitting: Neurology

## 2019-01-06 ENCOUNTER — Other Ambulatory Visit: Payer: Self-pay

## 2019-01-06 ENCOUNTER — Encounter: Payer: Self-pay | Admitting: Neurology

## 2019-01-06 VITALS — BP 138/66 | HR 63 | Temp 98.0°F | Ht 61.0 in | Wt 207.0 lb

## 2019-01-06 DIAGNOSIS — Z8673 Personal history of transient ischemic attack (TIA), and cerebral infarction without residual deficits: Secondary | ICD-10-CM | POA: Diagnosis not present

## 2019-01-06 DIAGNOSIS — R569 Unspecified convulsions: Secondary | ICD-10-CM

## 2019-01-06 NOTE — Patient Instructions (Signed)
Continue taking Keppra.

## 2019-01-07 ENCOUNTER — Other Ambulatory Visit: Payer: Self-pay | Admitting: Family Medicine

## 2019-01-07 ENCOUNTER — Telehealth: Payer: Self-pay | Admitting: Family Medicine

## 2019-01-07 MED ORDER — CITALOPRAM HYDROBROMIDE 20 MG PO TABS: ORAL_TABLET | ORAL | 1 refills | Status: DC

## 2019-01-07 NOTE — Telephone Encounter (Signed)
Received note from [facility] NP-C with Neuro that patient is concerned that her Celexa was stopped and would like to restart.  Will send in 30-day supply and taper instructions, has appt with me in about a week and will address further then.

## 2019-01-07 NOTE — Telephone Encounter (Signed)
Patient notified

## 2019-01-08 LAB — CBC WITH DIFFERENTIAL/PLATELET
Absolute Monocytes: 279 cells/uL (ref 200–950)
Basophils Absolute: 10 cells/uL (ref 0–200)
Basophils Relative: 0.3 %
Eosinophils Absolute: 51 cells/uL (ref 15–500)
Eosinophils Relative: 1.5 %
HCT: 33.3 % — ABNORMAL LOW (ref 35.0–45.0)
Hemoglobin: 11.2 g/dL — ABNORMAL LOW (ref 11.7–15.5)
Lymphs Abs: 1085 cells/uL (ref 850–3900)
MCH: 32.5 pg (ref 27.0–33.0)
MCHC: 33.6 g/dL (ref 32.0–36.0)
MCV: 96.5 fL (ref 80.0–100.0)
MPV: 10.2 fL (ref 7.5–12.5)
Monocytes Relative: 8.2 %
Neutro Abs: 1975 cells/uL (ref 1500–7800)
Neutrophils Relative %: 58.1 %
Platelets: 194 10*3/uL (ref 140–400)
RBC: 3.45 10*6/uL — ABNORMAL LOW (ref 3.80–5.10)
RDW: 11.8 % (ref 11.0–15.0)
Total Lymphocyte: 31.9 %
WBC: 3.4 10*3/uL — ABNORMAL LOW (ref 3.8–10.8)

## 2019-01-08 LAB — COMPLETE METABOLIC PANEL WITH GFR
AG Ratio: 1.4 (calc) (ref 1.0–2.5)
ALT: 8 U/L (ref 6–29)
AST: 11 U/L (ref 10–35)
Albumin: 4.1 g/dL (ref 3.6–5.1)
Alkaline phosphatase (APISO): 69 U/L (ref 37–153)
BUN/Creatinine Ratio: 13 (calc) (ref 6–22)
BUN: 20 mg/dL (ref 7–25)
CO2: 25 mmol/L (ref 20–32)
Calcium: 9.8 mg/dL (ref 8.6–10.4)
Chloride: 109 mmol/L (ref 98–110)
Creat: 1.59 mg/dL — ABNORMAL HIGH (ref 0.50–0.99)
GFR, Est African American: 38 mL/min/{1.73_m2} — ABNORMAL LOW (ref >=60)
GFR, Est Non African American: 33 mL/min/{1.73_m2} — ABNORMAL LOW (ref >=60)
Globulin: 3 g/dL (calc) (ref 1.9–3.7)
Glucose, Bld: 85 mg/dL (ref 65–99)
Potassium: 3.6 mmol/L (ref 3.5–5.3)
Sodium: 143 mmol/L (ref 135–146)
Total Bilirubin: 0.3 mg/dL (ref 0.2–1.2)
Total Protein: 7.1 g/dL (ref 6.1–8.1)

## 2019-01-08 LAB — LIPID PANEL
Cholesterol: 174 mg/dL (ref <200)
HDL: 62 mg/dL (ref >=50)
LDL Cholesterol (Calc): 99 mg/dL (calc)
Non-HDL Cholesterol (Calc): 112 mg/dL (calc) (ref <130)
Total CHOL/HDL Ratio: 2.8 (calc) (ref <5.0)
Triglycerides: 51 mg/dL (ref <150)

## 2019-01-08 LAB — HEMOGLOBIN A1C
Hgb A1c MFr Bld: 4.8 % of total Hgb (ref <5.7)
Mean Plasma Glucose: 91 (calc)
eAG (mmol/L): 5 (calc)

## 2019-01-08 LAB — MICROALBUMIN / CREATININE URINE RATIO
Creatinine, Urine: 83 mg/dL (ref 20–275)
Microalb Creat Ratio: 25 mcg/mg creat (ref <30)
Microalb, Ur: 2.1 mg/dL

## 2019-01-09 ENCOUNTER — Telehealth: Payer: Self-pay

## 2019-01-09 NOTE — Telephone Encounter (Signed)
Copied from [place] [phone] [phone]. Topic: Referral - Status >> Jan 09, 2019 84:53 AM [name] D wrote: 6/46/8032 Emailed resource letter for transportation, in home aide and food resources to [employer] to mail to patient. Left message on daughter's voicemail per note in chart for patient to return my call.  Will call again next week. Ambrose Mantle [phone]

## 2019-01-12 ENCOUNTER — Encounter: Payer: Self-pay | Admitting: Family Medicine

## 2019-01-12 ENCOUNTER — Ambulatory Visit (INDEPENDENT_AMBULATORY_CARE_PROVIDER_SITE_OTHER): Payer: Medicare Other | Admitting: Family Medicine

## 2019-01-12 ENCOUNTER — Other Ambulatory Visit: Payer: Self-pay

## 2019-01-12 DIAGNOSIS — I6523 Occlusion and stenosis of bilateral carotid arteries: Secondary | ICD-10-CM | POA: Diagnosis not present

## 2019-01-12 DIAGNOSIS — E782 Mixed hyperlipidemia: Secondary | ICD-10-CM | POA: Diagnosis not present

## 2019-01-12 DIAGNOSIS — I10 Essential (primary) hypertension: Secondary | ICD-10-CM

## 2019-01-12 DIAGNOSIS — E119 Type 2 diabetes mellitus without complications: Secondary | ICD-10-CM

## 2019-01-12 DIAGNOSIS — N183 Chronic kidney disease, stage 3 unspecified: Secondary | ICD-10-CM

## 2019-01-12 DIAGNOSIS — R569 Unspecified convulsions: Secondary | ICD-10-CM

## 2019-01-12 DIAGNOSIS — D649 Anemia, unspecified: Secondary | ICD-10-CM

## 2019-01-12 DIAGNOSIS — F419 Anxiety disorder, unspecified: Secondary | ICD-10-CM

## 2019-01-12 DIAGNOSIS — R609 Edema, unspecified: Secondary | ICD-10-CM | POA: Diagnosis not present

## 2019-01-12 DIAGNOSIS — D709 Neutropenia, unspecified: Secondary | ICD-10-CM

## 2019-01-12 DIAGNOSIS — Z8673 Personal history of transient ischemic attack (TIA), and cerebral infarction without residual deficits: Secondary | ICD-10-CM

## 2019-01-12 MED ORDER — LOSARTAN POTASSIUM-HCTZ 50-12.5 MG PO TABS: 0.5000 | ORAL_TABLET | Freq: Every day | ORAL | 1 refills | Status: DC

## 2019-01-12 MED ORDER — EZETIMIBE 10 MG PO TABS: 10.0000 mg | ORAL_TABLET | Freq: Every day | ORAL | 3 refills | Status: DC

## 2019-01-12 MED ORDER — CITALOPRAM HYDROBROMIDE 20 MG PO TABS: ORAL_TABLET | ORAL | 1 refills | Status: DC

## 2019-01-12 NOTE — Progress Notes (Signed)
Name: Leslie Houston   MRN: [id number]    DOB: [birth year]   Date:01/12/2019       Progress Note  Subjective  Chief Complaint  Chief Complaint  Patient presents with  . Follow-up  . Joint Swelling    right knee  . Joint Swelling    bilateral    I connected with  [name] on 01/12/19 at 10:40 AM EDT by telephone and verified that I am speaking with the correct person using two identifiers.   I discussed the limitations, risks, security and privacy concerns of performing an evaluation and management service by telephone and the availability of in person appointments. Staff also discussed with the patient that there may be a patient responsible charge related to this service. Patient Location: Home Provider Location: Office Additional Individuals present: [name]  HPI  Dependent edema Patient states ankle swelling has improved with elevation; worse on the right side. She tried compression stockings but broke out due to material. Does drink some soda but states doesn't add salt to foods. She states the swelling is intermittent and has been present since her stroke; she is doing better since coming off of amlodipine and only taking the losartan-HCTZ. Discussed referral to vein and vascular for lymphedema evaluation.  Hyperlipidemia Patient rx atorvastatin 10mg  nightly, however did not take because of muscle cramping.  Her last lipids were not at goal - would like her LDL to be less than 70.  We discussed Zetia and we wills tart this today. Lab Results  Component Value Date   CHOL 174 01/07/2019   HDL 62 01/07/2019   LDLCALC 99 01/07/2019   TRIG 51 01/07/2019   CHOLHDL 2.8 01/07/2019   Hypertension - 133/79 at home today. Patient was on amlodipine but had leg swelling so was switched to Lostartan-HCTZ. Takes medications as prescribed with no missed doses a month.  She is compliant with low-salt diet. Denies chest pain, headaches, blurry vision. BP Readings from  Last 3 Encounters:  01/06/19 138/66  01/01/19 135/82  11/12/18 137/80   Diabetes Mellitus/CKD Patient is diet controlled, improved with weight loss. Denies polyphagia, polydipsia, polyuria. Stopped statin; on ARB. Does check feet- no ulcers just swelling.  Lab Results  Component Value Date   HGBA1C 4.8 01/07/2019   Seizures: She is taking her keppra as prescribed, follows up with neurology. States it has been awhile since her last seizure- reviewed neurology note from Butler Denmark NP-C.  Neutropenia and anemia:  Saw Dr. Grayland Ormond- noted neutropenia likely due to [place]- no intervention needed at this time, routinely monitor.  Anxiety: She restarted Celexa 20mg  and can already tell a difference.  We will maintain this at this time.    Patient Active Problem List   Diagnosis Date Noted  . Osteopenia 01/24/2018  . Anxiety 12/03/2017  . Neutropenia ([place]) 12/31/2015  . Seizures ([place])   . Carotid stenosis   . PVC's (premature ventricular contractions)   . Diabetes mellitus ([place])   . History of stroke 08/19/2014  . CKD (chronic kidney disease) stage 3, GFR 30-59 ml/min (HCC) 08/19/2014  . OBESITY 03/04/2006  . Essential hypertension 03/04/2006  . GERD 03/04/2006  . PEPTIC ULCER DISEASE 03/04/2006    Social History   Tobacco Use  . Smoking status: Former Smoker    Quit date: 03/30/2014    Years since quitting: 4.7  . Smokeless tobacco: Never Used  Substance Use Topics  . Alcohol use: No     Current Outpatient Medications:  .  amLODipine (NORVASC) 5 MG tablet, Take 5 mg by mouth daily. , Disp: , Rfl:  .  aspirin (ASPIR-LOW) 81 MG EC tablet, Take 1 tablet (81 mg total) by mouth daily. Swallow whole., Disp: 30 tablet, Rfl: 11 .  citalopram (CELEXA) 20 MG tablet, Take 1/2 tablet once daily for 7 days, then may increase to 1 tablet once daily., Disp: 30 tablet, Rfl: 1 .  fluticasone (FLONASE) 50 MCG/ACT nasal spray, Place 2 sprays into both nostrils daily as needed for allergies  (allergies). , Disp: , Rfl:  .  levETIRAcetam (KEPPRA) 500 MG tablet, One in the morning, 2 tabs at night, Disp: 270 tablet, Rfl: 4 .  losartan-hydrochlorothiazide (HYZAAR) 50-12.5 MG tablet, Take 0.5 tablets by mouth daily. Come in to get labs checked 2 weeks after, Disp: 45 tablet, Rfl: 0  Allergies  Allergen Reactions  . Atorvastatin Other (See Comments)    Myalgias     I personally reviewed active problem list, medication list, allergies, notes from last encounter, lab results with the patient/caregiver today.  ROS  Constitutional: Negative for fever or weight change.  Respiratory: Negative for cough and shortness of breath.   Cardiovascular: Negative for chest pain or palpitations.  Gastrointestinal: Negative for abdominal pain, no bowel changes.  Musculoskeletal: Negative for gait problem or joint swelling.  Skin: Negative for rash.  Neurological: Negative for dizziness or headache.  No other specific complaints in a complete review of systems (except as listed in HPI above).  Objective  Virtual encounter, vitals not obtained.  There is no height or weight on file to calculate BMI.  Nursing Note and Vital Signs reviewed.  Physical Exam  Constitutional: Patient appears well-developed and well-nourished. No distress.  HENT: Head: Normocephalic and atraumatic.  Neck: Normal range of motion. Pulmonary/Chest: Effort normal. No respiratory distress. Speaking in complete sentences Neurological: Pt is alert and oriented to person, place, and situation. Speech is normal Psychiatric: Patient has a normal mood and affect. behavior is normal. Judgment and thought content normal.  Depression screen [facility] 2/9 01/12/2019 01/01/2019 11/12/2018 10/08/2018 12/03/2017  Decreased Interest 0 3 0 0 0  Down, Depressed, Hopeless 0 3 0 0 0  PHQ - 2 Score 0 6 0 0 0  Altered sleeping 0 3 0 0 2  Tired, decreased energy 0 3 0 0 2  Change in appetite 0 3 0 0 0  Feeling bad or failure about yourself  0  2 0 0 0  Trouble concentrating 0 2 0 0 0  Moving slowly or fidgety/restless 0 2 0 0 0  Suicidal thoughts 0 0 0 0 0  PHQ-9 Score 0 21 0 0 4  Difficult doing work/chores Not difficult at all Somewhat difficult Not difficult at all Not difficult at all Not difficult at all  - PHQ-9 score is significantly improved, negative today.   No results found for this or any previous visit (from the past 72 hour(s)).  Assessment & Plan  1. Dependent edema - Sending for lymphedema eval s/p stroke.  - Ambulatory referral to Vascular Surgery  2. History of stroke - Ambulatory referral to Vascular Surgery  3. Bilateral carotid artery stenosis - ezetimibe (ZETIA) 10 MG tablet; Take 1 tablet (10 mg total) by mouth daily.  Dispense: 90 tablet; Refill: 3  4. Mixed hyperlipidemia - ezetimibe (ZETIA) 10 MG tablet; Take 1 tablet (10 mg total) by mouth daily.  Dispense: 90 tablet; Refill: 3  5. Essential hypertension - Stable per home BP report.  Need  to recheck kidney function as there was a drop in GFR. - losartan-hydrochlorothiazide (HYZAAR) 50-12.5 MG tablet; Take 0.5 tablets by mouth daily. Come in to get labs checked 2 weeks after  Dispense: 45 tablet; Refill: 1  6. Type 2 diabetes mellitus without complication, without long-term current use of insulin (HCC) - Stable, labs UTD - BASIC METABOLIC PANEL WITH GFR  7. Seizures ([facility]) - Seeing neurology  8. CKD (chronic kidney disease) stage 3, GFR 30-59 ml/min ([facility]) - Rechecking in 1-2 weeks to trend. - BASIC METABOLIC PANEL WITH GFR  9. Neutropenia, unspecified type ([place]) - Improved on last labs  10. Mild anemia - Possible due to worsening kidney disease; need to recheck to trend in about 1-2 weeks - CBC  11. Anxiety - doing wellback on celexa - citalopram (CELEXA) 20 MG tablet; Take 1 tablet once daily  Dispense: 90 tablet; Refill: 1  - I discussed the assessment and treatment plan with the patient. The patient was provided an opportunity  to ask questions and all were answered. The patient agreed with the plan and demonstrated an understanding of the instructions.  - The patient was advised to call back or seek an in-person evaluation if the symptoms worsen or if the condition fails to improve as anticipated.  I provided 26 minutes of non-face-to-face time during this encounter.  Hubbard Hartshorn, FNP

## 2019-01-12 NOTE — Progress Notes (Signed)
I have reviewed and agreed above plan. 

## 2019-01-15 ENCOUNTER — Telehealth: Payer: Self-pay

## 2019-01-15 NOTE — Telephone Encounter (Signed)
Copied from [place] [id number]. Topic: Referral - Status >> Jan 15, 2019  1:82 PM Leslie Houston wrote: 8/83/3744 Spoke with patient about food and transportation resources. Leslie Houston [phone]

## 2019-01-16 ENCOUNTER — Ambulatory Visit: Payer: Self-pay

## 2019-01-16 NOTE — Telephone Encounter (Signed)
Pt. Reports she is having dizziness, weakness and nausea. BP this morning was 144/66. This afternoon it is 192/91, pulse 59. Seymour Bars on the line with pt. Reports her amlodipine had been stopped. Pt. Reports her appetite has been bad "for a while." Requests advise. Please call Seymour Bars [phone]. Answer Assessment - Initial Assessment Questions 1. BLOOD PRESSURE: "What is the blood pressure?" "Did you take at least two measurements 5 minutes apart?"     144/66  And  192/91  Pulse 59 2. ONSET: "When did you take your blood pressure?"     Today 3. HOW: "How did you obtain the blood pressure?" (e.g., visiting nurse, automatic home BP monitor)     Home monitor 4. HISTORY: "Do you have a history of high blood pressure?"     Yes 5. MEDICATIONS: "Are you taking any medications for blood pressure?" "Have you missed any doses recently?"     No missed doses 6. OTHER SYMPTOMS: "Do you have any symptoms?" (e.g., headache, chest pain, blurred vision, difficulty breathing, weakness)     Dizziness and weakness 7. PREGNANCY: "Is there any chance you are pregnant?" "When was your last menstrual period?"     No  Protocols used: HIGH BLOOD PRESSURE-A-AH

## 2019-01-17 ENCOUNTER — Emergency Department (HOSPITAL_BASED_OUTPATIENT_CLINIC_OR_DEPARTMENT_OTHER): Payer: Medicare Other

## 2019-01-17 ENCOUNTER — Emergency Department (HOSPITAL_BASED_OUTPATIENT_CLINIC_OR_DEPARTMENT_OTHER)
Admission: EM | Admit: 2019-01-17 | Discharge: 2019-01-17 | Disposition: A | Discharge status: Home / Self Care | Payer: Medicare Other | Attending: Emergency Medicine | Admitting: Emergency Medicine

## 2019-01-17 ENCOUNTER — Encounter (HOSPITAL_BASED_OUTPATIENT_CLINIC_OR_DEPARTMENT_OTHER): Payer: Self-pay

## 2019-01-17 ENCOUNTER — Other Ambulatory Visit: Payer: Self-pay

## 2019-01-17 DIAGNOSIS — I2510 Atherosclerotic heart disease of native coronary artery without angina pectoris: Secondary | ICD-10-CM | POA: Insufficient documentation

## 2019-01-17 DIAGNOSIS — Z888 Allergy status to other drugs, medicaments and biological substances status: Secondary | ICD-10-CM | POA: Insufficient documentation

## 2019-01-17 DIAGNOSIS — I130 Hypertensive heart and chronic kidney disease with heart failure and stage 1 through stage 4 chronic kidney disease, or unspecified chronic kidney disease: Secondary | ICD-10-CM | POA: Diagnosis not present

## 2019-01-17 DIAGNOSIS — I509 Heart failure, unspecified: Secondary | ICD-10-CM | POA: Insufficient documentation

## 2019-01-17 DIAGNOSIS — Z7984 Long term (current) use of oral hypoglycemic drugs: Secondary | ICD-10-CM | POA: Diagnosis not present

## 2019-01-17 DIAGNOSIS — E876 Hypokalemia: Secondary | ICD-10-CM | POA: Diagnosis not present

## 2019-01-17 DIAGNOSIS — R2243 Localized swelling, mass and lump, lower limb, bilateral: Secondary | ICD-10-CM | POA: Insufficient documentation

## 2019-01-17 DIAGNOSIS — E1122 Type 2 diabetes mellitus with diabetic chronic kidney disease: Secondary | ICD-10-CM | POA: Diagnosis not present

## 2019-01-17 DIAGNOSIS — Z8673 Personal history of transient ischemic attack (TIA), and cerebral infarction without residual deficits: Secondary | ICD-10-CM | POA: Insufficient documentation

## 2019-01-17 DIAGNOSIS — Z7982 Long term (current) use of aspirin: Secondary | ICD-10-CM | POA: Insufficient documentation

## 2019-01-17 DIAGNOSIS — R531 Weakness: Secondary | ICD-10-CM | POA: Diagnosis not present

## 2019-01-17 DIAGNOSIS — Z79899 Other long term (current) drug therapy: Secondary | ICD-10-CM | POA: Diagnosis not present

## 2019-01-17 DIAGNOSIS — Z87891 Personal history of nicotine dependence: Secondary | ICD-10-CM | POA: Diagnosis not present

## 2019-01-17 DIAGNOSIS — N183 Chronic kidney disease, stage 3 (moderate): Secondary | ICD-10-CM | POA: Insufficient documentation

## 2019-01-17 DIAGNOSIS — N189 Chronic kidney disease, unspecified: Secondary | ICD-10-CM

## 2019-01-17 LAB — URINALYSIS, MICROSCOPIC (REFLEX): RBC / HPF: NONE SEEN RBC/hpf (ref 0–5)

## 2019-01-17 LAB — URINALYSIS, ROUTINE W REFLEX MICROSCOPIC
Bilirubin Urine: NEGATIVE
Glucose, UA: NEGATIVE mg/dL
Hgb urine dipstick: NEGATIVE
Ketones, ur: NEGATIVE mg/dL
Nitrite: NEGATIVE
Protein, ur: NEGATIVE mg/dL
Specific Gravity, Urine: 1.01 (ref 1.005–1.030)
pH: 5.5 (ref 5.0–8.0)

## 2019-01-17 LAB — CBC WITH DIFFERENTIAL/PLATELET
Abs Immature Granulocytes: 0.01 10*3/uL (ref 0.00–0.07)
Basophils Absolute: 0 10*3/uL (ref 0.0–0.1)
Basophils Relative: 0 %
Eosinophils Absolute: 0 10*3/uL (ref 0.0–0.5)
Eosinophils Relative: 1 %
HCT: 34.8 % — ABNORMAL LOW (ref 36.0–46.0)
Hemoglobin: 11.5 g/dL — ABNORMAL LOW (ref 12.0–15.0)
Immature Granulocytes: 0 %
Lymphocytes Relative: 24 %
Lymphs Abs: 0.8 10*3/uL (ref 0.7–4.0)
MCH: 32.6 pg (ref 26.0–34.0)
MCHC: 33 g/dL (ref 30.0–36.0)
MCV: 98.6 fL (ref 80.0–100.0)
Monocytes Absolute: 0.3 10*3/uL (ref 0.1–1.0)
Monocytes Relative: 8 %
Neutro Abs: 2.4 10*3/uL (ref 1.7–7.7)
Neutrophils Relative %: 67 %
Platelets: 185 10*3/uL (ref 150–400)
RBC: 3.53 MIL/uL — ABNORMAL LOW (ref 3.87–5.11)
RDW: 11.7 % (ref 11.5–15.5)
WBC: 3.5 10*3/uL — ABNORMAL LOW (ref 4.0–10.5)
nRBC: 0 % (ref 0.0–0.2)

## 2019-01-17 LAB — COMPREHENSIVE METABOLIC PANEL
ALT: 12 U/L (ref 0–44)
AST: 16 U/L (ref 15–41)
Albumin: 4.1 g/dL (ref 3.5–5.0)
Alkaline Phosphatase: 68 U/L (ref 38–126)
Anion gap: 14 (ref 5–15)
BUN: 19 mg/dL (ref 8–23)
CO2: 24 mmol/L (ref 22–32)
Calcium: 9.7 mg/dL (ref 8.9–10.3)
Chloride: 100 mmol/L (ref 98–111)
Creatinine, Ser: 1.95 mg/dL — ABNORMAL HIGH (ref 0.44–1.00)
GFR calc Af Amer: 30 mL/min — ABNORMAL LOW (ref >60)
GFR calc non Af Amer: 26 mL/min — ABNORMAL LOW (ref >60)
Glucose, Bld: 94 mg/dL (ref 70–99)
Potassium: 3 mmol/L — ABNORMAL LOW (ref 3.5–5.1)
Sodium: 138 mmol/L (ref 135–145)
Total Bilirubin: 0.5 mg/dL (ref 0.3–1.2)
Total Protein: 7.7 g/dL (ref 6.5–8.1)

## 2019-01-17 LAB — TROPONIN I (HIGH SENSITIVITY)
Troponin I (High Sensitivity): 12 ng/L (ref <18)
Troponin I (High Sensitivity): 16 ng/L (ref <18)

## 2019-01-17 LAB — BRAIN NATRIURETIC PEPTIDE: B Natriuretic Peptide: 98.5 pg/mL (ref 0.0–100.0)

## 2019-01-17 MED ORDER — SODIUM CHLORIDE 0.9 % IV BOLUS
500.0000 mL | Freq: Once | INTRAVENOUS | Status: AC
  Administered 2019-01-17: 13:00:00 500 mL via INTRAVENOUS

## 2019-01-17 MED ORDER — CEPHALEXIN 500 MG PO CAPS: 500.0000 mg | ORAL_CAPSULE | Freq: Three times a day (TID) | ORAL | 0 refills | Status: AC

## 2019-01-17 MED ORDER — POTASSIUM CHLORIDE CRYS ER 20 MEQ PO TBCR
20.0000 meq | EXTENDED_RELEASE_TABLET | Freq: Once | ORAL | Status: AC
  Administered 2019-01-17: 20 meq via ORAL
  Filled 2019-01-17: qty 1

## 2019-01-17 NOTE — ED Triage Notes (Addendum)
Pt sent over by urgent care and pmd due to blood pressure elevation (182/78) taking losartan since July. Arrives with mild headache.  Denies chest pain. Has had increased swelling bilat lower extremities since July, when began meds.

## 2019-01-17 NOTE — ED Provider Notes (Signed)
[facility] EMERGENCY DEPARTMENT Provider Note   CSN: [id number] Arrival date & time: 01/17/19  1030     History   Chief Complaint Chief Complaint  Patient presents with  . Weakness    HPI Leslie Houston is a 68 y.o. female.  Presented emergency department with multiple issues, chief complaint of generalized weakness, elevated blood pressure, bilateral lower extremity swelling.  Regarding the blood pressure, states that her blood pressure has been elevated over the last few days, went to urgent care today and had a blood pressure of 182/78 and was referred to the ER for further evaluation.  Extensive review of systems, patient reported that she has been having a mild intermittent headaches over the past couple days.  No currently having a headache.'s states that she has not had any chest pain.  Has noted some increased swelling in her bilateral lower legs though this has been going on since July.  No recent changes to this.  No dysuria but has had increased urinary frequency, denies any abdominal pain vomiting or diarrhea.     HPI  Past Medical History:  Diagnosis Date  . CAD (coronary artery disease)    a. Pt reports in 2000 she had a stent to a coronary artery, details unclear, outside hospital.  . Carotid stenosis    a. CT angio head 07/2014 - 50-60% stenoses of prox ICA bilaterally.  . CHF (congestive heart failure) ([place])    a. Pt reports in 2000 she had CHF, details unclear, outside hospital.  . CKD (chronic kidney disease), stage III ([place])   . Diabetes mellitus ([place])   . Family hx of colon cancer 06/13/2018   Father diagnosed in his 39's  . Gout   . Hyperlipidemia   . Hypertension   . Orthostatic hypotension   . Osteopenia 01/24/2018   DEXA Sept 2019  . PUD (peptic ulcer disease)   . PVC's (premature ventricular contractions)   . Seizures ([place])   . Sinus bradycardia   . Stroke [facility])    a. L PCA CVA in 05/2014.  [name] Syncope     Patient Active Problem List    Diagnosis Date Noted  . Osteopenia 01/24/2018  . Anxiety 12/03/2017  . Neutropenia ([place]) 12/31/2015  . Seizures ([place])   . Carotid stenosis   . PVC's (premature ventricular contractions)   . Diabetes mellitus ([employer] [place] [employer])   . History of stroke 08/19/2014  . CKD (chronic kidney disease) stage 3, GFR 30-59 ml/min ([facility]) 08/19/2014  . OBESITY 03/04/2006  . Essential hypertension 03/04/2006  . GERD 03/04/2006  . PEPTIC ULCER DISEASE 03/04/2006    Past Surgical History:  Procedure Laterality Date  . CESAREAN SECTION    . DENTAL SURGERY    . Heart stent    . stint       OB History   No obstetric history on file.      Home Medications    Prior to Admission medications   Medication Sig Start Date End Date Taking? Authorizing Provider  aspirin (ASPIR-LOW) 81 MG EC tablet Take 1 tablet (81 mg total) by mouth daily. Swallow whole. 07/03/18   Suzzanne Cloud, NP  cephALEXin (KEFLEX) 500 MG capsule Take 1 capsule (500 mg total) by mouth 3 (three) times daily for 5 days. 01/17/19 01/22/19  Lucrezia Starch, MD  citalopram (CELEXA) 20 MG tablet Take 1 tablet once daily 01/12/19   Hubbard Hartshorn, FNP  ezetimibe (ZETIA) 10 MG tablet Take 1 tablet (10 mg total)  by mouth daily. 01/12/19   Hubbard Hartshorn, FNP  fluticasone (FLONASE) 50 MCG/ACT nasal spray Place 2 sprays into both nostrils daily as needed for allergies (allergies).     [provider]  levETIRAcetam (KEPPRA) 500 MG tablet One in the morning, 2 tabs at night 07/03/18   Suzzanne Cloud, NP  losartan-hydrochlorothiazide (HYZAAR) 50-12.5 MG tablet Take 0.5 tablets by mouth daily. Come in to get labs checked 2 weeks after 01/12/19   Hubbard Hartshorn, FNP    Family History Family History  Problem Relation Age of Onset  . Stroke Mother   . Heart attack Mother   . Lung cancer Mother   . Stroke Father   . Prostate cancer Father   . Colon cancer Father   . Diabetes Mellitus II Daughter   . Multiple sclerosis Daughter   . Kidney disease  Son   . Heart failure Son   . Cancer Paternal Grandfather     Social History Social History   Tobacco Use  . Smoking status: Former Smoker    Quit date: 03/30/2014    Years since quitting: 4.8  . Smokeless tobacco: Never Used  Substance Use Topics  . Alcohol use: No  . Drug use: No     Allergies   Atorvastatin   Review of Systems Review of Systems  Constitutional: Positive for fatigue. Negative for chills and fever.  HENT: Negative for ear pain and sore throat.   Eyes: Negative for pain and visual disturbance.  Respiratory: Negative for cough and shortness of breath.   Cardiovascular: Negative for chest pain and palpitations.  Gastrointestinal: Negative for abdominal pain and vomiting.  Genitourinary: Positive for frequency. Negative for dysuria and hematuria.  Musculoskeletal: Negative for arthralgias and back pain.  Skin: Negative for color change and rash.  Neurological: Negative for seizures and syncope.  All other systems reviewed and are negative.    Physical Exam Updated Vital Signs BP (!) 152/70 (BP Location: Left Arm)   Pulse 60   Temp 98 F (36.7 C) (Oral)   Resp 16   Ht 5' 1" (1.549 m)   Wt 92.1 kg   SpO2 99%   BMI 38.36 kg/m   Physical Exam Vitals signs and nursing note reviewed.  Constitutional:      General: She is not in acute distress.    Appearance: She is well-developed.  HENT:     Head: Normocephalic and atraumatic.  Eyes:     Conjunctiva/sclera: Conjunctivae normal.  Neck:     Musculoskeletal: Neck supple.  Cardiovascular:     Rate and Rhythm: Normal rate and regular rhythm.     Heart sounds: No murmur.  Pulmonary:     Effort: Pulmonary effort is normal. No respiratory distress.     Breath sounds: Normal breath sounds.  Abdominal:     Palpations: Abdomen is soft.     Tenderness: There is no abdominal tenderness.  Musculoskeletal:        General: No tenderness.     Comments: Mild nonpitting swelling in bilateral lower legs   Skin:    General: Skin is warm and dry.     Capillary Refill: Capillary refill takes less than 2 seconds.  Neurological:     General: No focal deficit present.     Mental Status: She is alert and oriented to person, place, and time.  Psychiatric:        Mood and Affect: Mood normal.        Behavior: Behavior  normal.        Thought Content: Thought content normal.      ED Treatments / Results  Labs (all labs ordered are listed, but only abnormal results are displayed) Labs Reviewed  COMPREHENSIVE METABOLIC PANEL - Abnormal; Notable for the following components:      Result Value   Potassium 3.0 (*)    Creatinine, Ser 1.95 (*)    GFR calc non Af Amer 26 (*)    GFR calc Af Amer 30 (*)    All other components within normal limits  CBC WITH DIFFERENTIAL/PLATELET - Abnormal; Notable for the following components:   WBC 3.5 (*)    RBC 3.53 (*)    Hemoglobin 11.5 (*)    HCT 34.8 (*)    All other components within normal limits  URINALYSIS, ROUTINE W REFLEX MICROSCOPIC - Abnormal; Notable for the following components:   Color, Urine STRAW (*)    APPearance CLOUDY (*)    Leukocytes,Ua SMALL (*)    All other components within normal limits  URINALYSIS, MICROSCOPIC (REFLEX) - Abnormal; Notable for the following components:   Bacteria, UA MANY (*)    All other components within normal limits  BRAIN NATRIURETIC PEPTIDE  TROPONIN I (HIGH SENSITIVITY)  TROPONIN I (HIGH SENSITIVITY)    EKG EKG Interpretation  Date/Time:  Saturday January 17 2019 11:24:34 EDT Ventricular Rate:  59 PR Interval:    QRS Duration: 94 QT Interval:  489 QTC Calculation: 485 R Axis:   69 Text Interpretation:  sinus rhythm  Prolonged PR interval Borderline repolarization abnormality Confirmed by Madalyn Rob [phone]) on 01/17/2019 1:11:56 PM   Radiology Ct Head Wo Contrast  Result Date: 01/17/2019 CLINICAL DATA:  Acute severe headache, worst headache of life, sudden onset. EXAM: CT HEAD WITHOUT  CONTRAST TECHNIQUE: Contiguous axial images were obtained from the base of the skull through the vertex without intravenous contrast. COMPARISON:  Head CT dated 09/06/2014. FINDINGS: Brain: Old infarct within the LEFT occipital lobe with associated encephalomalacia. Mild chronic small vessel ischemic changes within the deep periventricular white matter regions bilaterally. No mass, hemorrhage, edema or other evidence of acute parenchymal abnormality. No extra-axial hemorrhage. Vascular: Chronic calcified atherosclerotic changes of the large vessels at the skull base. No unexpected hyperdense vessel. Skull: Normal. Negative for fracture or focal lesion. Sinuses/Orbits: No acute finding. Other: None. IMPRESSION: 1. No acute findings. No intracranial mass, hemorrhage or edema. 2. Chronic ischemic changes, as detailed above. Electronically Signed   By: Franki Cabot M.D.   On: 01/17/2019 12:09   US Venous Img Lower Bilateral  Result Date: 01/17/2019 CLINICAL DATA:  Swelling x2 months EXAM: BILATERAL LOWER EXTREMITY VENOUS DOPPLER ULTRASOUND TECHNIQUE: Gray-scale sonography with compression, as well as color and duplex ultrasound, were performed to evaluate the deep venous system from the level of the common femoral vein through the popliteal and proximal calf veins. COMPARISON:  None FINDINGS: Normal compressibility of the common femoral, superficial femoral, and popliteal veins, as well as the proximal calf veins. No filling defects to suggest DVT on grayscale or color Doppler imaging. Doppler waveforms show normal direction of venous flow, normal respiratory phasicity and response to augmentation. IMPRESSION: No femoropopliteal and no calf DVT in the visualized calf veins. If clinical symptoms are inconsistent or if there are persistent or worsening symptoms, further imaging (possibly involving the iliac veins) may be warranted. Electronically Signed   By: Lucrezia Europe M.D.   On: 01/17/2019 13:24    Procedures  Procedures (including critical care  time)  Medications Ordered in ED Medications  potassium chloride SA (K-DUR) CR tablet 20 mEq (20 mEq Oral Given 01/17/19 1322)  sodium chloride 0.9 % bolus 500 mL (0 mLs Intravenous Stopped 01/17/19 1451)     Initial Impression / Assessment and Plan / ED Course  I have reviewed the triage vital signs and the nursing notes.  Pertinent labs & imaging results that were available during my care of the patient were reviewed by me and considered in my medical decision making (see chart for details).        68 year old lady presents with myriad complaints.  Regarding elevated blood pressure, reported headache, CT head negative, patient's blood pressure within reasonable range while in the department here, not consistent with hypertensive emergency.  Labs were concerning for mild worsening of renal function compared to recent check by PCP, hypokalemia.  May be due to some dehydration.  Provided small fluids, potassium supplementation today.  Recommend that patient have follow-up with her primary doctor as well as her primary nephrologist who she has seen before this coming week.  Her bilateral lower legs were mildly swollen, BNP was negative, doubt heart failure, lower extremity duplex was negative for DVT.  Recommend trial of elevation, compression stockings and again recheck with PCP.  Noted some urinary frequency, urinalysis was concerning for a UTI.  Will start on course of cephalexin.  Reviewed return precautions with patient and her friend in room in detail, believe she is appropriate for outpatient management this time, stressed need for PCP and nephrology follow-up next week.    After the discussed management above, the patient was determined to be safe for discharge.  The patient was in agreement with this plan and all questions regarding their care were answered.  ED return precautions were discussed and the patient will return to the ED with any significant  worsening of condition.    Final Clinical Impressions(s) / ED Diagnoses   Final diagnoses:  Generalized weakness  Chronic kidney disease, unspecified CKD stage  Hypokalemia    ED Discharge Orders         Ordered    cephALEXin (KEFLEX) 500 MG capsule  3 times daily     01/17/19 1435           Lucrezia Starch, MD 01/17/19 1540

## 2019-01-17 NOTE — Discharge Instructions (Signed)
Monday morning, please call both your primary doctor as well as your kidney doctor to schedule close follow-up appointment, ideally to be seen within the next few days.  If she has worsening weakness, nausea, vomiting, chest pain or difficulty breathing, fevers, please return to ER for reassessment.

## 2019-01-19 ENCOUNTER — Telehealth: Payer: Self-pay

## 2019-01-19 NOTE — Telephone Encounter (Signed)
Copied from [place] [id number]. Topic: Appointment Scheduling - Scheduling Inquiry for Clinic >> Jan 19, 2019 11:41 AM Erick Blinks wrote: Reason for CRM: Pt's caretaker ms. [name] called requesting a [facility] for pt regarding BP/UTI. Pt was discharged from ED 01/17/2019. Please advise Best contact: [phone] [name]) >> Jan 19, 2019 11:52 AM Karene Fry P wrote: Please advise where to put this patient. She was discharged on Saturday and they want her seen 7 days from discharge date

## 2019-01-19 NOTE — Telephone Encounter (Signed)
appt scheduled for thursday

## 2019-01-22 ENCOUNTER — Other Ambulatory Visit: Payer: Self-pay

## 2019-01-22 ENCOUNTER — Ambulatory Visit: Admission: RE | Admit: 2019-01-22 | Payer: Medicare Other | Source: Home / Self Care | Admitting: *Deleted

## 2019-01-22 ENCOUNTER — Other Ambulatory Visit: Payer: Self-pay | Admitting: Family Medicine

## 2019-01-22 ENCOUNTER — Telehealth: Payer: Self-pay

## 2019-01-22 ENCOUNTER — Ambulatory Visit (INDEPENDENT_AMBULATORY_CARE_PROVIDER_SITE_OTHER): Payer: Medicare Other | Admitting: Family Medicine

## 2019-01-22 ENCOUNTER — Encounter: Payer: Self-pay | Admitting: Family Medicine

## 2019-01-22 VITALS — BP 150/82 | HR 58 | Temp 96.6°F | Resp 14 | Ht 61.0 in | Wt 201.7 lb

## 2019-01-22 DIAGNOSIS — Z23 Encounter for immunization: Secondary | ICD-10-CM | POA: Diagnosis not present

## 2019-01-22 DIAGNOSIS — T466X5A Adverse effect of antihyperlipidemic and antiarteriosclerotic drugs, initial encounter: Secondary | ICD-10-CM

## 2019-01-22 DIAGNOSIS — M791 Myalgia, unspecified site: Secondary | ICD-10-CM

## 2019-01-22 DIAGNOSIS — Z8639 Personal history of other endocrine, nutritional and metabolic disease: Secondary | ICD-10-CM | POA: Insufficient documentation

## 2019-01-22 DIAGNOSIS — I129 Hypertensive chronic kidney disease with stage 1 through stage 4 chronic kidney disease, or unspecified chronic kidney disease: Secondary | ICD-10-CM | POA: Diagnosis not present

## 2019-01-22 DIAGNOSIS — I10 Essential (primary) hypertension: Secondary | ICD-10-CM | POA: Diagnosis not present

## 2019-01-22 DIAGNOSIS — M25562 Pain in left knee: Secondary | ICD-10-CM

## 2019-01-22 DIAGNOSIS — N183 Chronic kidney disease, stage 3 (moderate): Secondary | ICD-10-CM | POA: Diagnosis not present

## 2019-01-22 DIAGNOSIS — M25561 Pain in right knee: Secondary | ICD-10-CM

## 2019-01-22 DIAGNOSIS — Z1211 Encounter for screening for malignant neoplasm of colon: Secondary | ICD-10-CM

## 2019-01-22 DIAGNOSIS — Z62810 Personal history of physical and sexual abuse in childhood: Secondary | ICD-10-CM

## 2019-01-22 DIAGNOSIS — N390 Urinary tract infection, site not specified: Secondary | ICD-10-CM

## 2019-01-22 DIAGNOSIS — I6523 Occlusion and stenosis of bilateral carotid arteries: Secondary | ICD-10-CM

## 2019-01-22 DIAGNOSIS — G8929 Other chronic pain: Secondary | ICD-10-CM

## 2019-01-22 DIAGNOSIS — F70 Mild intellectual disabilities: Secondary | ICD-10-CM | POA: Insufficient documentation

## 2019-01-22 MED ORDER — DICLOFENAC SODIUM 1 % TD GEL: 4.0000 g | Freq: Four times a day (QID) | TRANSDERMAL | 0 refills | Status: DC

## 2019-01-22 MED ORDER — OLMESARTAN MEDOXOMIL-HCTZ 40-12.5 MG PO TABS: 1.0000 | ORAL_TABLET | Freq: Every day | ORAL | 0 refills | Status: DC

## 2019-01-22 NOTE — Telephone Encounter (Signed)
Copied from [place] [id number]. Topic: Referral - Status >> Jan 22, 2019  3:53 PM [name] D wrote: 10/11/4313 Spoke with patient about Medicaid transportation, Medicaid in-home aide and meals on wheels. Patient received resource letter and will call in the next few days. These organizations have to speak directly to the patient or a caregiver. Ambrose Mantle [phone]
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
## 2019-01-22 NOTE — Addendum Note (Signed)
Addended by: [name] A on: 01/22/2019 12:24 PM   Modules accepted: Orders

## 2019-01-23 LAB — CBC WITH DIFFERENTIAL/PLATELET
Absolute Monocytes: 382 cells/uL (ref 200–950)
Basophils Absolute: 8 cells/uL (ref 0–200)
Basophils Relative: 0.2 %
Eosinophils Absolute: 50 cells/uL (ref 15–500)
Eosinophils Relative: 1.2 %
HCT: 35.1 % (ref 35.0–45.0)
Hemoglobin: 11.8 g/dL (ref 11.7–15.5)
Lymphs Abs: 1109 cells/uL (ref 850–3900)
MCH: 32.2 pg (ref 27.0–33.0)
MCHC: 33.6 g/dL (ref 32.0–36.0)
MCV: 95.9 fL (ref 80.0–100.0)
MPV: 10.2 fL (ref 7.5–12.5)
Monocytes Relative: 9.1 %
Neutro Abs: 2650 cells/uL (ref 1500–7800)
Neutrophils Relative %: 63.1 %
Platelets: 189 10*3/uL (ref 140–400)
RBC: 3.66 10*6/uL — ABNORMAL LOW (ref 3.80–5.10)
RDW: 11.5 % (ref 11.0–15.0)
Total Lymphocyte: 26.4 %
WBC: 4.2 10*3/uL (ref 3.8–10.8)

## 2019-01-23 LAB — COMPLETE METABOLIC PANEL WITH GFR
AG Ratio: 1.4 (calc) (ref 1.0–2.5)
ALT: 10 U/L (ref 6–29)
AST: 14 U/L (ref 10–35)
Albumin: 4.2 g/dL (ref 3.6–5.1)
Alkaline phosphatase (APISO): 70 U/L (ref 37–153)
BUN/Creatinine Ratio: 11 (calc) (ref 6–22)
BUN: 22 mg/dL (ref 7–25)
CO2: 28 mmol/L (ref 20–32)
Calcium: 10.1 mg/dL (ref 8.6–10.4)
Chloride: 105 mmol/L (ref 98–110)
Creat: 2.07 mg/dL — ABNORMAL HIGH (ref 0.50–0.99)
GFR, Est African American: 28 mL/min/{1.73_m2} — ABNORMAL LOW (ref >=60)
GFR, Est Non African American: 24 mL/min/{1.73_m2} — ABNORMAL LOW (ref >=60)
Globulin: 3.1 g/dL (calc) (ref 1.9–3.7)
Glucose, Bld: 83 mg/dL (ref 65–99)
Potassium: 3.8 mmol/L (ref 3.5–5.3)
Sodium: 143 mmol/L (ref 135–146)
Total Bilirubin: 0.5 mg/dL (ref 0.2–1.2)
Total Protein: 7.3 g/dL (ref 6.1–8.1)

## 2019-01-23 LAB — URINE CULTURE
MICRO NUMBER:: 919372
Result:: NO GROWTH
SPECIMEN QUALITY:: ADEQUATE

## 2019-01-26 ENCOUNTER — Ambulatory Visit
Admission: RE | Admit: 2019-01-26 | Discharge: 2019-01-26 | Disposition: A | Discharge status: Home / Self Care | Payer: Medicare Other | Source: Ambulatory Visit | Attending: Family Medicine | Admitting: Family Medicine

## 2019-01-26 ENCOUNTER — Other Ambulatory Visit: Payer: Self-pay

## 2019-01-26 DIAGNOSIS — G8929 Other chronic pain: Secondary | ICD-10-CM

## 2019-01-26 DIAGNOSIS — M25561 Pain in right knee: Secondary | ICD-10-CM | POA: Insufficient documentation

## 2019-01-26 DIAGNOSIS — M25562 Pain in left knee: Secondary | ICD-10-CM

## 2019-01-27 ENCOUNTER — Encounter (INDEPENDENT_AMBULATORY_CARE_PROVIDER_SITE_OTHER): Payer: Medicare Other | Admitting: Vascular Surgery

## 2019-01-29 ENCOUNTER — Other Ambulatory Visit: Payer: Self-pay

## 2019-01-29 ENCOUNTER — Ambulatory Visit (INDEPENDENT_AMBULATORY_CARE_PROVIDER_SITE_OTHER): Payer: Medicare Other | Admitting: Family Medicine

## 2019-01-29 ENCOUNTER — Encounter: Payer: Self-pay | Admitting: Family Medicine

## 2019-01-29 VITALS — BP 140/72 | HR 60 | Temp 96.9°F | Resp 14 | Ht 61.0 in | Wt 200.5 lb

## 2019-01-29 DIAGNOSIS — G8929 Other chronic pain: Secondary | ICD-10-CM

## 2019-01-29 DIAGNOSIS — N1832 Chronic kidney disease, stage 3b: Secondary | ICD-10-CM | POA: Diagnosis not present

## 2019-01-29 DIAGNOSIS — I10 Essential (primary) hypertension: Secondary | ICD-10-CM | POA: Diagnosis not present

## 2019-01-29 DIAGNOSIS — M25562 Pain in left knee: Secondary | ICD-10-CM

## 2019-01-29 DIAGNOSIS — H9191 Unspecified hearing loss, right ear: Secondary | ICD-10-CM | POA: Diagnosis not present

## 2019-01-29 DIAGNOSIS — M25561 Pain in right knee: Secondary | ICD-10-CM

## 2019-01-29 DIAGNOSIS — Z1211 Encounter for screening for malignant neoplasm of colon: Secondary | ICD-10-CM

## 2019-01-29 DIAGNOSIS — Z1212 Encounter for screening for malignant neoplasm of rectum: Secondary | ICD-10-CM

## 2019-01-29 NOTE — Progress Notes (Signed)
Name: Leslie Houston   MRN: [id number]    DOB: 07-09-1950   Date:01/29/2019       Progress Note  Subjective  Chief Complaint  Chief Complaint  Patient presents with  . Follow-up    pain in both knees.  . Knee Pain    HPI  HTN: Back for BP check today because she was taken off of amlodipine at last visit, and is now taking olmesartan-HCTZ 40/12.5mg , and BP is at goal today.  Her BLE edema has improved, recommend compression stockings as well during the day, elevated at night.  Denies chest pain, shortness of breath.  Taking aspirin daily.  We will refer back to nephrology for evaluation of low kidney function.  Bilateral Knee pain: Had bilateral Knee Xrays done earlier this week - she has some demineralization which is expected with her osteopenia; she also has degenerative changes bilaterally R>L.  She has been using her voltaren gel and this has helped significantly.  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  Patient Active Problem List   Diagnosis Date Noted  . 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 History of diabetes mellitus 01/22/2019  . Osteopenia 01/24/2018  . Anxiety 12/03/2017  . Neutropenia ([place]) 12/31/2015  . Seizures ([place])   . Carotid stenosis   . PVC's (premature ventricular contractions)   . Hemiparesis affecting right side as late effect of cerebrovascular accident (CVA) ([place]) 08/19/2014  . CKD (chronic kidney disease) stage 3, GFR 30-59 ml/min 08/19/2014  . Morbid obesity ([place]) 03/04/2006  . Essential hypertension 03/04/2006  . GERD 03/04/2006  . History of gastric ulcer 03/04/2006    Past Surgical History:  Procedure Laterality Date  . CESAREAN SECTION    . DENTAL SURGERY    . Heart stent    . stint      Family History  Problem Relation Age of Onset  . Stroke Mother   . Heart attack Mother   . Lung cancer Mother   . Stroke Father   . Prostate  cancer Father   . Colon cancer Father   . Diabetes Mellitus II Daughter   . Multiple sclerosis Daughter   . Kidney disease Son   . Heart failure Son   . Cancer Paternal Grandfather     Social History   Socioeconomic History  . Marital status: Single    Spouse name: Not on file  . Number of children: 2  . Years of education: 5  . Highest education level: Not on file  Occupational History  . Occupation: Disabled  Social Needs  . Financial resource strain: Hard  . Food insecurity    Worry: Sometimes true    Inability: Sometimes true  . Transportation needs    Medical: Yes    Non-medical: Yes  Tobacco Use  . Smoking status: Former Smoker    Quit date: 03/30/2014    Years since quitting: 4.8  . Smokeless tobacco: Never Used  Substance and Sexual Activity  . Alcohol use: No  . Drug use: No  . Sexual activity: Not Currently  Lifestyle  . Physical activity    Days per week: 0 days    Minutes per session: 0 min  . Stress: Rather much  Relationships  . Social connections    Talks on phone: Patient refused    Gets together: Patient refused    Attends religious service: Patient refused  Active member of club or organization: Patient refused    Attends meetings of clubs or organizations: Patient refused    Relationship status: Patient refused  . Intimate partner violence    Fear of current or ex partner: No    Emotionally abused: No    Physically abused: No    Forced sexual activity: No  Other Topics Concern  . Not on file  Social History Narrative   Lives at home with her son's father.   Right-handed.   1 cup coffee per day.     Current Outpatient Medications:  .  aspirin (ASPIR-LOW) 81 MG EC tablet, Take 1 tablet (81 mg total) by mouth daily. Swallow whole., Disp: 30 tablet, Rfl: 11 .  citalopram (CELEXA) 20 MG tablet, Take 1 tablet once daily, Disp: 90 tablet, Rfl: 1 .  diclofenac sodium (VOLTAREN) 1 % GEL, Apply 4 g topically 4 (four) times daily., Disp: 300  g, Rfl: 0 .  levETIRAcetam (KEPPRA) 500 MG tablet, One in the morning, 2 tabs at night, Disp: 270 tablet, Rfl: 4 .  olmesartan-hydrochlorothiazide (BENICAR HCT) 40-12.5 MG tablet, Take 1 tablet by mouth daily., Disp: 30 tablet, Rfl: 0 .  fluticasone (FLONASE) 50 MCG/ACT nasal spray, Place 2 sprays into both nostrils daily as needed for allergies (allergies). , Disp: , Rfl:   Allergies  Allergen Reactions  . Atorvastatin Other (See Comments)    Myalgias     I personally reviewed active problem list, medication list, allergies, notes from last encounter, lab results with the patient/caregiver today.   ROS  Ten systems reviewed and is negative except as mentioned in HPI  Objective  Vitals:   01/29/19 1331  BP: 140/72  Pulse: 60  Resp: 14  Temp: (!) 96.9 F (36.1 C)  SpO2: 97%  Weight: 200 lb 8 oz (90.9 kg)  Height: 5\' 1"  (1.549 m)   Body mass index is 37.88 kg/m.  Physical Exam  Constitutional: Patient appears well-developed and well-nourished. No distress.  HENT: Head: Normocephalic and atraumatic.  Eyes: Conjunctivae and EOM are normal. No scleral icterus.  Neck: Normal range of motion. Neck supple. No JVD present.  Cardiovascular: Normal rate, regular rhythm and normal heart sounds.  No murmur heard. No BLE edema. Pulmonary/Chest: Effort normal and breath sounds normal. No respiratory distress. Musculoskeletal: Normal range of motion, no joint effusions. No gross deformities. Neurological: Pt is alert and oriented to person, place, and time. No cranial nerve deficit. Coordination, balance, strength, speech and gait are normal.  Skin: Skin is warm and dry. No rash noted. No erythema.  Psychiatric: Patient has a normal mood and affect. behavior is normal. Judgment and thought content normal.  No results found for this or any previous visit (from the past 72 hour(s)).  PHQ2/9: Depression screen [facility] 2/9 01/29/2019 01/22/2019 01/12/2019 01/01/2019 11/12/2018  Decreased  Interest 0 0 0 3 0  Down, Depressed, Hopeless 0 0 0 3 0  PHQ - 2 Score 0 0 0 6 0  Altered sleeping 0 0 0 3 0  Tired, decreased energy 0 0 0 3 0  Change in appetite 0 0 0 3 0  Feeling bad or failure about yourself  0 0 0 2 0  Trouble concentrating 0 0 0 2 0  Moving slowly or fidgety/restless 0 0 0 2 0  Suicidal thoughts 0 0 0 0 0  PHQ-9 Score 0 0 0 21 0  Difficult doing work/chores Not difficult at all Not difficult at all Not difficult at  all Somewhat difficult Not difficult at all   PHQ-2/9 Result is negative.    Fall Risk: Fall Risk  01/29/2019 01/22/2019 01/12/2019 01/01/2019 11/12/2018  Falls in the past year? 0 0 0 0 0  Number falls in past yr: 0 0 0 0 0  Injury with Fall? 0 0 0 0 0  Risk for fall due to : - - - Impaired vision;Impaired balance/gait -  Follow up - - Falls evaluation completed Falls prevention discussed -   Functional Status Survey: Is the patient deaf or have difficulty hearing?: Yes Does the patient have difficulty seeing, even when wearing glasses/contacts?: Yes Does the patient have difficulty concentrating, remembering, or making decisions?: No Does the patient have difficulty walking or climbing stairs?: No Does the patient have difficulty dressing or bathing?: No Does the patient have difficulty doing errands alone such as visiting a doctor's office or shopping?: No  Assessment & Plan  1. Essential hypertension - Stable today; needs to see nephrology - Ambulatory referral to Nephrology  2. Chronic pain of both knees - Voltaren gel is working well  3. Decreased hearing of right ear - Ambulatory referral to Audiology  4. Stage 3b chronic kidney disease - Ambulatory referral to Nephrology

## 2019-02-04 ENCOUNTER — Other Ambulatory Visit: Payer: Self-pay | Admitting: Family Medicine

## 2019-02-04 ENCOUNTER — Telehealth: Payer: Self-pay

## 2019-02-04 NOTE — Telephone Encounter (Signed)
Requested medication (s) are due for refill today: no  Requested medication (s) are on the active medication list: no  Last refill:  10/09/2018  Future visit scheduled: yes  Notes to clinic:  Medication was discontinued    Requested Prescriptions  Pending Prescriptions Disp Refills   atorvastatin (LIPITOR) 10 MG tablet [Pharmacy Med Name: ATORVASTATIN CALCIUM 10 MG TAB] 90 tablet 0    Sig: TAKE 1 TABLET BY MOUTH EVERY NIGHT AT. BEDTIME. DO NOT RESTART UNTIL 10/12/18.     Cardiovascular:  Antilipid - Statins Passed - 02/04/2019  9:44 AM      Passed - Total Cholesterol in normal range and within 360 days    Cholesterol  Date Value Ref Range Status  01/07/2019 174 <200 mg/dL Final         Passed - LDL in normal range and within 360 days    LDL Cholesterol (Calc)  Date Value Ref Range Status  01/07/2019 99 mg/dL (calc) Final    Comment:    Reference range: <100 . Desirable range <100 mg/dL for primary prevention;   <70 mg/dL for patients with CHD or diabetic patients  with > or = 2 CHD risk factors. [name] LDL-C is now calculated using the Martin-Hopkins  calculation, which is a validated novel method providing  better accuracy than the Friedewald equation in the  estimation of LDL-C.  Cresenciano Genre et al. Annamaria Helling. 4917;915(05): 2061-2068  (http://education.[employer].com/faq/FAQ164)          Passed - HDL in normal range and within 360 days    HDL  Date Value Ref Range Status  01/07/2019 62 > OR = 50 mg/dL Final         Passed - Triglycerides in normal range and within 360 days    Triglycerides  Date Value Ref Range Status  01/07/2019 51 <150 mg/dL Final         Passed - Patient is not pregnant      Passed - Valid encounter within last 12 months    Recent Outpatient Visits          6 days ago Essential hypertension   [place], [place], FNP   1 week ago Benign hypertension with CKD (chronic kidney disease) stage III [facility])   [facility] Steele Sizer, MD   3 weeks ago Dependent edema   [place], FNP   2 months ago Essential hypertension   [place], NP   3 months ago Bilateral swelling of feet and ankles   [place], NP      Future Appointments            In 1 week Uvaldo Rising, Astrid Divine, [facility], [place]   In 11 months  [facility], [facility]

## 2019-02-04 NOTE — Telephone Encounter (Signed)
Copied from [place] [id number]. Topic: Referral - Status >> Feb 04, 2019  1:84 PM [name] D wrote: 85/12/2761 Spoke with patient's daughter Lilyrose Tanney per note in chart.  Will email resources to her per request. Ambrose Mantle [phone]

## 2019-02-05 NOTE — Telephone Encounter (Signed)
Copied from [place] [id number]. Topic: Quick Communication - See Telephone Encounter >> Feb 05, 2019 10:07 AM L[place] wrote: CRM for notification. See Telephone encounter for: 02/05/19. [facility] sent PA over at 10:00 am this morning 2nd request   I have not received any PA request for this patient and is uncertain of which medication she is needing it for.  I called this patient and she said that she did not know anything about it. She said that she has been getting a lot of calls from telemarketers (stating they are calling from [place]) about medicines, neck braces and other things. I told her not to worry about it and that I would let her provider know. She said ok.  FYI: I did not place that order for Atorvastatin (it's on her allergy list)

## 2019-02-06 ENCOUNTER — Telehealth: Payer: Self-pay

## 2019-02-06 NOTE — Telephone Encounter (Signed)
Copied from [place] [id number]. Topic: Referral - Status >> Feb 06, 2019  4:00 PM [name] D wrote: 86/10/6193 Spoke with patient and her daughter [name] about [employer] aide, transportation and Meals on Wheels. [name] will call to get her mother signed up for these services.  These organizations have to speak directly to the patient or a caregiver.  Ambrose Mantle [phone]

## 2019-02-12 ENCOUNTER — Ambulatory Visit: Payer: Medicare Other | Admitting: Family Medicine

## 2019-02-13 ENCOUNTER — Telehealth: Payer: Self-pay | Admitting: Emergency Medicine

## 2019-02-13 ENCOUNTER — Ambulatory Visit: Payer: Medicare Other | Admitting: Family Medicine

## 2019-02-13 DIAGNOSIS — I129 Hypertensive chronic kidney disease with stage 1 through stage 4 chronic kidney disease, or unspecified chronic kidney disease: Secondary | ICD-10-CM

## 2019-02-13 DIAGNOSIS — I10 Essential (primary) hypertension: Secondary | ICD-10-CM

## 2019-02-13 NOTE — Telephone Encounter (Signed)
Patient and her caregiver [name] stated that everything was going well accept the patient need something mild for constipation. She think the Benicar HCT maybe what is causing it,but it is working with her BP. Per Raquel Sarna patient may use Miralax and if she have and abdominal pains or symptoms to call office. Patient stated she will call back on Monday if any issues. Also need refill on Benicar-HCT

## 2019-02-13 NOTE — Telephone Encounter (Signed)
Agree with documentation.

## 2019-02-17 ENCOUNTER — Other Ambulatory Visit: Payer: Self-pay | Admitting: Family Medicine

## 2019-02-17 DIAGNOSIS — N183 Chronic kidney disease, stage 3 unspecified: Secondary | ICD-10-CM

## 2019-02-17 DIAGNOSIS — I10 Essential (primary) hypertension: Secondary | ICD-10-CM

## 2019-02-17 DIAGNOSIS — I129 Hypertensive chronic kidney disease with stage 1 through stage 4 chronic kidney disease, or unspecified chronic kidney disease: Secondary | ICD-10-CM

## 2019-02-19 ENCOUNTER — Telehealth: Payer: Self-pay | Admitting: Family Medicine

## 2019-02-19 NOTE — Telephone Encounter (Signed)
[facility]- requesting a PA.for medical supplies. [place] Signature on the form. Will refax Please CB- [phone].

## 2019-02-20 NOTE — Telephone Encounter (Signed)
Have the paperwork will call and see if Raquel Sarna NP can sign or do they need a MD

## 2019-03-17 ENCOUNTER — Telehealth: Payer: Self-pay

## 2019-03-17 NOTE — Telephone Encounter (Signed)
Copied from [place] [id number]. Topic: General - Other >> Mar 17, 2019 11:32 AM Rainey Pines A wrote: [facility] is requesting a callback in regards to paperwork that was faxed a week ago in regards to a prior authorization for patient. Callback [phone]   I have no idea of what paperwork they are referring to, do you know anything about this?

## 2019-03-18 NOTE — Telephone Encounter (Signed)
Will Have our manager reach out to [facility] to see what paperwork is for

## 2019-03-18 NOTE — Telephone Encounter (Signed)
Spoke with patient and she confirmed that she doesn't use [employer] representative.  Therefore no information will be provided to this company in question.

## 2019-04-20 ENCOUNTER — Other Ambulatory Visit: Payer: Self-pay | Admitting: Nephrology

## 2019-04-20 DIAGNOSIS — N1832 Chronic kidney disease, stage 3b: Secondary | ICD-10-CM

## 2019-04-29 ENCOUNTER — Ambulatory Visit: Payer: Medicare Other | Attending: Nephrology

## 2019-07-02 ENCOUNTER — Emergency Department: Payer: Medicare Other

## 2019-07-02 ENCOUNTER — Other Ambulatory Visit: Payer: Self-pay

## 2019-07-02 ENCOUNTER — Inpatient Hospital Stay
Admission: EM | Admit: 2019-07-02 | Discharge: 2019-07-07 | DRG: 682 | Disposition: A | Discharge status: Home / Self Care | Payer: Medicare Other | Attending: Internal Medicine | Admitting: Internal Medicine

## 2019-07-02 ENCOUNTER — Encounter: Payer: Self-pay | Admitting: Intensive Care

## 2019-07-02 DIAGNOSIS — E872 Acidosis: Secondary | ICD-10-CM | POA: Diagnosis not present

## 2019-07-02 DIAGNOSIS — M109 Gout, unspecified: Secondary | ICD-10-CM | POA: Diagnosis present

## 2019-07-02 DIAGNOSIS — Z6841 Body Mass Index (BMI) 40.0 and over, adult: Secondary | ICD-10-CM

## 2019-07-02 DIAGNOSIS — R001 Bradycardia, unspecified: Secondary | ICD-10-CM

## 2019-07-02 DIAGNOSIS — H9191 Unspecified hearing loss, right ear: Secondary | ICD-10-CM | POA: Diagnosis not present

## 2019-07-02 DIAGNOSIS — I130 Hypertensive heart and chronic kidney disease with heart failure and stage 1 through stage 4 chronic kidney disease, or unspecified chronic kidney disease: Secondary | ICD-10-CM | POA: Diagnosis present

## 2019-07-02 DIAGNOSIS — R569 Unspecified convulsions: Secondary | ICD-10-CM

## 2019-07-02 DIAGNOSIS — N1330 Unspecified hydronephrosis: Secondary | ICD-10-CM | POA: Diagnosis not present

## 2019-07-02 DIAGNOSIS — Z880 Allergy status to penicillin: Secondary | ICD-10-CM

## 2019-07-02 DIAGNOSIS — I440 Atrioventricular block, first degree: Secondary | ICD-10-CM | POA: Diagnosis present

## 2019-07-02 DIAGNOSIS — H5461 Unqualified visual loss, right eye, normal vision left eye: Secondary | ICD-10-CM | POA: Diagnosis present

## 2019-07-02 DIAGNOSIS — D61818 Other pancytopenia: Secondary | ICD-10-CM | POA: Diagnosis not present

## 2019-07-02 DIAGNOSIS — I509 Heart failure, unspecified: Secondary | ICD-10-CM | POA: Diagnosis not present

## 2019-07-02 DIAGNOSIS — I2510 Atherosclerotic heart disease of native coronary artery without angina pectoris: Secondary | ICD-10-CM | POA: Diagnosis not present

## 2019-07-02 DIAGNOSIS — Z955 Presence of coronary angioplasty implant and graft: Secondary | ICD-10-CM

## 2019-07-02 DIAGNOSIS — I10 Essential (primary) hypertension: Secondary | ICD-10-CM | POA: Diagnosis not present

## 2019-07-02 DIAGNOSIS — Z91048 Other nonmedicinal substance allergy status: Secondary | ICD-10-CM

## 2019-07-02 DIAGNOSIS — N189 Chronic kidney disease, unspecified: Secondary | ICD-10-CM

## 2019-07-02 DIAGNOSIS — I252 Old myocardial infarction: Secondary | ICD-10-CM

## 2019-07-02 DIAGNOSIS — G8929 Other chronic pain: Secondary | ICD-10-CM

## 2019-07-02 DIAGNOSIS — Z20822 Contact with and (suspected) exposure to covid-19: Secondary | ICD-10-CM | POA: Diagnosis not present

## 2019-07-02 DIAGNOSIS — E875 Hyperkalemia: Secondary | ICD-10-CM | POA: Diagnosis not present

## 2019-07-02 DIAGNOSIS — Z8249 Family history of ischemic heart disease and other diseases of the circulatory system: Secondary | ICD-10-CM

## 2019-07-02 DIAGNOSIS — N184 Chronic kidney disease, stage 4 (severe): Secondary | ICD-10-CM | POA: Diagnosis present

## 2019-07-02 DIAGNOSIS — I5033 Acute on chronic diastolic (congestive) heart failure: Secondary | ICD-10-CM | POA: Diagnosis present

## 2019-07-02 DIAGNOSIS — Z888 Allergy status to other drugs, medicaments and biological substances status: Secondary | ICD-10-CM

## 2019-07-02 DIAGNOSIS — D638 Anemia in other chronic diseases classified elsewhere: Secondary | ICD-10-CM | POA: Diagnosis not present

## 2019-07-02 DIAGNOSIS — M8580 Other specified disorders of bone density and structure, unspecified site: Secondary | ICD-10-CM | POA: Diagnosis present

## 2019-07-02 DIAGNOSIS — E785 Hyperlipidemia, unspecified: Secondary | ICD-10-CM | POA: Diagnosis not present

## 2019-07-02 DIAGNOSIS — Z823 Family history of stroke: Secondary | ICD-10-CM

## 2019-07-02 DIAGNOSIS — Z7982 Long term (current) use of aspirin: Secondary | ICD-10-CM

## 2019-07-02 DIAGNOSIS — R0602 Shortness of breath: Secondary | ICD-10-CM | POA: Diagnosis present

## 2019-07-02 DIAGNOSIS — K219 Gastro-esophageal reflux disease without esophagitis: Secondary | ICD-10-CM | POA: Diagnosis present

## 2019-07-02 DIAGNOSIS — R5381 Other malaise: Secondary | ICD-10-CM

## 2019-07-02 DIAGNOSIS — D631 Anemia in chronic kidney disease: Secondary | ICD-10-CM | POA: Diagnosis not present

## 2019-07-02 DIAGNOSIS — F4310 Post-traumatic stress disorder, unspecified: Secondary | ICD-10-CM | POA: Diagnosis not present

## 2019-07-02 DIAGNOSIS — R600 Localized edema: Secondary | ICD-10-CM

## 2019-07-02 DIAGNOSIS — I69351 Hemiplegia and hemiparesis following cerebral infarction affecting right dominant side: Secondary | ICD-10-CM

## 2019-07-02 DIAGNOSIS — Z8711 Personal history of peptic ulcer disease: Secondary | ICD-10-CM

## 2019-07-02 DIAGNOSIS — G40909 Epilepsy, unspecified, not intractable, without status epilepticus: Secondary | ICD-10-CM | POA: Diagnosis not present

## 2019-07-02 DIAGNOSIS — N179 Acute kidney failure, unspecified: Secondary | ICD-10-CM | POA: Diagnosis not present

## 2019-07-02 DIAGNOSIS — E668 Other obesity: Secondary | ICD-10-CM

## 2019-07-02 DIAGNOSIS — Z79899 Other long term (current) drug therapy: Secondary | ICD-10-CM

## 2019-07-02 DIAGNOSIS — Z87891 Personal history of nicotine dependence: Secondary | ICD-10-CM

## 2019-07-02 LAB — IRON AND TIBC
Iron: 62 ug/dL (ref 28–170)
Saturation Ratios: 21 % (ref 10.4–31.8)
TIBC: 302 ug/dL (ref 250–450)
UIBC: 240 ug/dL

## 2019-07-02 LAB — COMPREHENSIVE METABOLIC PANEL
ALT: 26 U/L (ref 0–44)
AST: 23 U/L (ref 15–41)
Albumin: 4.1 g/dL (ref 3.5–5.0)
Alkaline Phosphatase: 100 U/L (ref 38–126)
Anion gap: 7 (ref 5–15)
BUN: 53 mg/dL — ABNORMAL HIGH (ref 8–23)
CO2: 17 mmol/L — ABNORMAL LOW (ref 22–32)
Calcium: 9.3 mg/dL (ref 8.9–10.3)
Chloride: 114 mmol/L — ABNORMAL HIGH (ref 98–111)
Creatinine, Ser: 3.49 mg/dL — ABNORMAL HIGH (ref 0.44–1.00)
GFR calc Af Amer: 15 mL/min — ABNORMAL LOW (ref >60)
GFR calc non Af Amer: 13 mL/min — ABNORMAL LOW (ref >60)
Glucose, Bld: 87 mg/dL (ref 70–99)
Potassium: 5.2 mmol/L — ABNORMAL HIGH (ref 3.5–5.1)
Sodium: 138 mmol/L (ref 135–145)
Total Bilirubin: 0.6 mg/dL (ref 0.3–1.2)
Total Protein: 7.4 g/dL (ref 6.5–8.1)

## 2019-07-02 LAB — CBC WITH DIFFERENTIAL/PLATELET
Abs Immature Granulocytes: 0.04 10*3/uL (ref 0.00–0.07)
Basophils Absolute: 0 10*3/uL (ref 0.0–0.1)
Basophils Relative: 0 %
Eosinophils Absolute: 0.1 10*3/uL (ref 0.0–0.5)
Eosinophils Relative: 1 %
HCT: 26.1 % — ABNORMAL LOW (ref 36.0–46.0)
Hemoglobin: 8.7 g/dL — ABNORMAL LOW (ref 12.0–15.0)
Immature Granulocytes: 1 %
Lymphocytes Relative: 17 %
Lymphs Abs: 0.8 10*3/uL (ref 0.7–4.0)
MCH: 35.2 pg — ABNORMAL HIGH (ref 26.0–34.0)
MCHC: 33.3 g/dL (ref 30.0–36.0)
MCV: 105.7 fL — ABNORMAL HIGH (ref 80.0–100.0)
Monocytes Absolute: 0.3 10*3/uL (ref 0.1–1.0)
Monocytes Relative: 6 %
Neutro Abs: 3.4 10*3/uL (ref 1.7–7.7)
Neutrophils Relative %: 75 %
Platelets: 136 10*3/uL — ABNORMAL LOW (ref 150–400)
RBC: 2.47 MIL/uL — ABNORMAL LOW (ref 3.87–5.11)
RDW: 13.3 % (ref 11.5–15.5)
WBC: 4.5 10*3/uL (ref 4.0–10.5)
nRBC: 0 % (ref 0.0–0.2)

## 2019-07-02 LAB — TSH: TSH: 2.892 u[IU]/mL (ref 0.350–4.500)

## 2019-07-02 LAB — RESPIRATORY PANEL BY RT PCR (FLU A&B, COVID)
Influenza A by PCR: NEGATIVE
Influenza B by PCR: NEGATIVE
SARS Coronavirus 2 by RT PCR: NEGATIVE

## 2019-07-02 LAB — URINALYSIS, ROUTINE W REFLEX MICROSCOPIC
Bacteria, UA: NONE SEEN
Bilirubin Urine: NEGATIVE
Glucose, UA: NEGATIVE mg/dL
Hgb urine dipstick: NEGATIVE
Ketones, ur: NEGATIVE mg/dL
Nitrite: NEGATIVE
Protein, ur: NEGATIVE mg/dL
Specific Gravity, Urine: 1.009 (ref 1.005–1.030)
pH: 5 (ref 5.0–8.0)

## 2019-07-02 LAB — HIV ANTIBODY (ROUTINE TESTING W REFLEX): HIV Screen 4th Generation wRfx: NONREACTIVE

## 2019-07-02 LAB — TROPONIN I (HIGH SENSITIVITY)
Troponin I (High Sensitivity): 13 ng/L (ref <18)
Troponin I (High Sensitivity): 13 ng/L (ref <18)

## 2019-07-02 LAB — T4, FREE: Free T4: 0.84 ng/dL (ref 0.61–1.12)

## 2019-07-02 LAB — BRAIN NATRIURETIC PEPTIDE: B Natriuretic Peptide: 180 pg/mL — ABNORMAL HIGH (ref 0.0–100.0)

## 2019-07-02 MED ORDER — LEVETIRACETAM 500 MG PO TABS
500.0000 mg | ORAL_TABLET | Freq: Two times a day (BID) | ORAL | Status: DC
  Administered 2019-07-02 – 2019-07-07 (×10): 500 mg via ORAL
  Filled 2019-07-02 (×10): qty 1

## 2019-07-02 MED ORDER — DICLOFENAC SODIUM 1 % TD GEL
4.0000 g | Freq: Four times a day (QID) | TRANSDERMAL | Status: DC | PRN
  Filled 2019-07-02: qty 100

## 2019-07-02 MED ORDER — FUROSEMIDE 10 MG/ML IJ SOLN: 80.0000 mg | Freq: Once | INTRAMUSCULAR | Status: DC

## 2019-07-02 MED ORDER — CITALOPRAM HYDROBROMIDE 20 MG PO TABS
20.0000 mg | ORAL_TABLET | Freq: Every day | ORAL | Status: DC
  Administered 2019-07-03 – 2019-07-07 (×5): 20 mg via ORAL
  Filled 2019-07-02 (×5): qty 1

## 2019-07-02 MED ORDER — ONDANSETRON HCL 4 MG PO TABS: 4.0000 mg | ORAL_TABLET | Freq: Four times a day (QID) | ORAL | Status: DC | PRN

## 2019-07-02 MED ORDER — ASPIRIN EC 81 MG PO TBEC
81.0000 mg | DELAYED_RELEASE_TABLET | Freq: Every day | ORAL | Status: DC
  Administered 2019-07-03 – 2019-07-07 (×5): 81 mg via ORAL
  Filled 2019-07-02 (×4): qty 1

## 2019-07-02 MED ORDER — ONDANSETRON HCL 4 MG/2ML IJ SOLN: 4.0000 mg | Freq: Four times a day (QID) | INTRAMUSCULAR | Status: DC | PRN

## 2019-07-02 MED ORDER — ACETAMINOPHEN 325 MG PO TABS: 650.0000 mg | ORAL_TABLET | Freq: Four times a day (QID) | ORAL | Status: DC | PRN

## 2019-07-02 MED ORDER — SENNOSIDES-DOCUSATE SODIUM 8.6-50 MG PO TABS: 1.0000 | ORAL_TABLET | Freq: Every evening | ORAL | Status: DC | PRN

## 2019-07-02 MED ORDER — FUROSEMIDE 10 MG/ML IJ SOLN
80.0000 mg | Freq: Two times a day (BID) | INTRAMUSCULAR | Status: DC
  Administered 2019-07-02: 17:00:00 80 mg via INTRAVENOUS
  Filled 2019-07-02: qty 8

## 2019-07-02 MED ORDER — ATORVASTATIN CALCIUM 20 MG PO TABS
10.0000 mg | ORAL_TABLET | Freq: Every day | ORAL | Status: DC
  Administered 2019-07-02 – 2019-07-06 (×5): 10 mg via ORAL
  Filled 2019-07-02 (×6): qty 1

## 2019-07-02 MED ORDER — FLUTICASONE PROPIONATE 50 MCG/ACT NA SUSP
2.0000 | Freq: Every day | NASAL | Status: DC | PRN
  Filled 2019-07-02: qty 16

## 2019-07-02 MED ORDER — ACETAMINOPHEN 650 MG RE SUPP: 650.0000 mg | Freq: Four times a day (QID) | RECTAL | Status: DC | PRN

## 2019-07-02 MED ORDER — SODIUM CHLORIDE 0.9% FLUSH
10.0000 mL | Freq: Two times a day (BID) | INTRAVENOUS | Status: DC
  Administered 2019-07-02 – 2019-07-07 (×9): 10 mL via INTRAVENOUS

## 2019-07-02 NOTE — ED Provider Notes (Signed)
[facility] Emergency Department Provider Note  ____________________________________________   First MD Initiated Contact with Patient 07/02/19 1101     (approximate)  I have reviewed the triage vital signs and the nursing notes.   HISTORY  Chief Complaint Abnormal Lab    HPI Leslie Houston is a 69 y.o. female with CKD, PTSD, seizures, stroke letter with deficits of some right vision loss and right hearing loss who comes with abnormal blood work.  Patient sees Dr. Candiss Norse and was concerned that she was having increased kidney function.  Also noted that her legs were more swollen and she was having more difficulty with walking.  Patient was on torsemide 10 mg.  However they noticed that since December her kidney function has been steadily increasing.  She is now been having more shortness of breath with exertion, intermittent, nothing makes it better, nothing makes it worse.  No history of blood clots.          Past Medical History:  Diagnosis Date  . CAD (coronary artery disease)    a. Pt reports in 2000 she had a stent to a coronary artery, details unclear, outside hospital.  . Carotid stenosis    a. CT angio head 07/2014 - 50-60% stenoses of prox ICA bilaterally.  . CHF (congestive heart failure) ([place])    a. Pt reports in 2000 she had CHF, details unclear, outside hospital.  . CKD (chronic kidney disease), stage III   . Diabetes mellitus ([place])   . Family hx of colon cancer 06/13/2018   Father diagnosed in his 60's  . Gout   . Hyperlipidemia   . Hypertension   . Orthostatic hypotension   . Osteopenia 01/24/2018   DEXA Sept 2019  . PUD (peptic ulcer disease)   . PVC's (premature ventricular contractions)   . Seizures ([place])   . Sinus bradycardia   . Stroke [facility])    a. L PCA CVA in 05/2014.  [name] Syncope     Patient Active Problem List   Diagnosis Date Noted  . 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 History of diabetes mellitus 01/22/2019  .  Osteopenia 01/24/2018  . Anxiety 12/03/2017  . Neutropenia ([place]) 12/31/2015  . Seizures ([place])   . Carotid stenosis   . PVC's (premature ventricular contractions)   . Hemiparesis affecting right side as late effect of cerebrovascular accident (CVA) ([place]) 08/19/2014  . CKD (chronic kidney disease) stage 3, GFR 30-59 ml/min 08/19/2014  . Morbid obesity ([place]) 03/04/2006  . Essential hypertension 03/04/2006  . GERD 03/04/2006  . History of gastric ulcer 03/04/2006    Past Surgical History:  Procedure Laterality Date  . CESAREAN SECTION    . DENTAL SURGERY    . Heart stent    . stint      Prior to Admission medications   Medication Sig Start Date End Date Taking? Authorizing Provider  aspirin (ASPIR-LOW) 81 MG EC tablet Take 1 tablet (81 mg total) by mouth daily. Swallow whole. 07/03/18   Suzzanne Cloud, NP  atorvastatin (LIPITOR) 10 MG tablet TAKE 1 TABLET BY MOUTH EVERY NIGHT AT. BEDTIME. DO NOT RESTART UNTIL 10/12/18. 02/05/19   Hubbard Hartshorn, FNP  citalopram (CELEXA) 20 MG tablet Take 1 tablet once daily 01/12/19   Hubbard Hartshorn, FNP  diclofenac sodium (VOLTAREN) 1 % GEL Apply 4 g topically 4 (four) times daily. 01/22/19   Steele Sizer, MD  fluticasone (FLONASE) 50 MCG/ACT nasal spray Place 2 sprays into both nostrils daily  as needed for allergies (allergies).     [provider]  levETIRAcetam (KEPPRA) 500 MG tablet One in the morning, 2 tabs at night 07/03/18   Suzzanne Cloud, NP  olmesartan-hydrochlorothiazide (BENICAR HCT) 40-12.5 MG tablet TAKE 1 TABLET BY MOUTH ONCE DAILY 02/17/19   Hubbard Hartshorn, FNP    Allergies Atorvastatin  Family History  Problem Relation Age of Onset  . Stroke Mother   . Heart attack Mother   . Lung cancer Mother   . Stroke Father   . Prostate cancer Father   . Colon cancer Father   . Diabetes Mellitus II Daughter   . Multiple sclerosis Daughter   . Kidney disease Son   . Heart failure Son   . Cancer Paternal Grandfather     Social  History Social History   Tobacco Use  . Smoking status: Former Smoker    Quit date: 03/30/2014    Years since quitting: 5.2  . Smokeless tobacco: Never Used  Substance Use Topics  . Alcohol use: No  . Drug use: No      Review of Systems Constitutional: No fever/chills Eyes: No visual changes. ENT: No sore throat. Cardiovascular: Denies chest pain. Respiratory: Positive shortness of breath Gastrointestinal: No abdominal pain.  No nausea, no vomiting.  No diarrhea.  No constipation. Genitourinary: Negative for dysuria. Musculoskeletal: Negative for back pain.  Positive leg swelling Skin: Negative for rash. Neurological: Negative for headaches, focal weakness or numbness. All other ROS negative ____________________________________________   PHYSICAL EXAM:  VITAL SIGNS: ED Triage Vitals  Enc Vitals Group     BP 07/02/19 1049 (!) 138/52     Pulse Rate 07/02/19 1049 (!) 53     Resp 07/02/19 1049 17     Temp 07/02/19 1049 (!) 97.4 F (36.3 C)     Temp Source 07/02/19 1049 Oral     SpO2 07/02/19 1049 97 %     Weight 07/02/19 1050 200 lb (90.7 kg)     Height 07/02/19 1050 5\' 1"  (1.549 m)     Head Circumference --      Peak Flow --      Pain Score --      Pain Loc --      Pain Edu? --      Excl. in [place]? --     Constitutional: Alert and oriented. Well appearing and in no acute distress. Eyes: Conjunctivae are normal. EOMI. Head: Atraumatic. Nose: No congestion/rhinnorhea. Mouth/Throat: Mucous membranes are moist.   Neck: No stridor. Trachea Midline. FROM Cardiovascular: Normal rate, regular rhythm. Grossly normal heart sounds.  Good peripheral circulation. Respiratory: Increased work of breathing no retractions. Lungs CTAB. Gastrointestinal: Soft and nontender. No distention. No abdominal bruits.  Musculoskeletal: 2+ edema bilaterally no joint effusions. Neurologic:  Normal speech and language. No gross focal neurologic deficits are appreciated.  Skin:  Skin is warm,  dry and intact. No rash noted. Psychiatric: Mood and affect are normal. Speech and behavior are normal. GU: Deferred   ____________________________________________   LABS (all labs ordered are listed, but only abnormal results are displayed)  Labs Reviewed  CBC WITH DIFFERENTIAL/PLATELET  COMPREHENSIVE METABOLIC PANEL  BRAIN NATRIURETIC PEPTIDE  URINALYSIS, ROUTINE W REFLEX MICROSCOPIC  TSH  T4, FREE  TROPONIN I (HIGH SENSITIVITY)   ____________________________________________   ED ECG REPORT I, Vanessa [place], the attending physician, personally viewed and interpreted this ECG.  EKG is sinus rate of 51, no ST elevations, no T wave inversions, normal intervals ____________________________________________  RADIOLOGY I, Vanessa [place], personally viewed and evaluated these images (plain radiographs) as part of my medical decision making, as well as reviewing the written report by the radiologist.  ED MD interpretation: Concern for pulmonary edema bilaterally  Official radiology report(s): DG Chest Portable 1 View  Result Date: 07/02/2019 CLINICAL DATA:  69 year old female with shortness of breath and worsening kidney function. EXAM: PORTABLE CHEST 1 VIEW COMPARISON:  Chest radiographs 11/15/2014 and earlier. FINDINGS: Portable AP semi upright view at 1110 hours. Stable cardiac size at the upper limits of normal. Other mediastinal contours are within normal limits. Visualized tracheal air column is within normal limits. Pulmonary vascularity appears mildly increased, including from a 2016 portable chest. No pneumothorax, pleural effusion or confluent pulmonary opacity. Negative visible bowel gas pattern. No acute osseous abnormality identified. IMPRESSION: Increased pulmonary vascularity or interstitial opacity from prior studies. Consider mild or developing interstitial edema, viral/atypical respiratory infection. Electronically Signed   By: Genevie Ann M.D.   On: 07/02/2019 11:18     ____________________________________________   PROCEDURES  Procedure(s) performed (including Critical Care):  Procedures   ____________________________________________   INITIAL IMPRESSION / ASSESSMENT AND PLAN / ED COURSE  Leslie Houston was evaluated in Emergency Department on 07/02/2019 for the symptoms described in the history of present illness. She was evaluated in the context of the global COVID-19 pandemic, which necessitated consideration that the patient might be at risk for infection with the SARS-CoV-2 virus that causes COVID-19. Institutional protocols and algorithms that pertain to the evaluation of patients at risk for COVID-19 are in a state of rapid change based on information released by regulatory bodies including the CDC and federal and state organizations. These policies and algorithms were followed during the patient's care in the ED.    Patient presents with worsening kidney function and concerns for fluid overload on exam.  Will get chest x-ray to evaluate for pleural effusions, pulmonary edema.  Will get BNP to evaluate heart function.  Will get cardiac records and EKG to evaluate for ACS although no report of chest discomfort.  Will get repeat kidney function to make sure no other electrolyte abnormalities.  Urine to evaluate for UTI  Hemoglobin is decreasing from prior most likely secondary to her renal disease.  Denies any history of bleeding issues.  We will need to continue to trend this out.  Kidney function is 3.49 gradually increasing since September.  Chest x-ray concerning for pulmonary edema.  Discussed with Dr. Candiss Norse who recommended IV Lasix 80 and admission for attempted diuresis although if kidney function is worsening she may need to be put on dialysis.       ____________________________________________   FINAL CLINICAL IMPRESSION(S) / ED DIAGNOSES   Final diagnoses:  Acute renal failure superimposed on chronic kidney disease,  unspecified CKD stage, unspecified acute renal failure type (HCC)  Leg edema  Shortness of breath      MEDICATIONS GIVEN DURING THIS VISIT:  Medications  furosemide (LASIX) injection 80 mg (has no administration in time range)  aspirin EC tablet 81 mg (has no administration in time range)  atorvastatin (LIPITOR) tablet 10 mg (has no administration in time range)  citalopram (CELEXA) tablet 20 mg (has no administration in time range)  levETIRAcetam (KEPPRA) tablet 500 mg (has no administration in time range)  fluticasone (FLONASE) 50 MCG/ACT nasal spray 2 spray (has no administration in time range)  diclofenac sodium (VOLTAREN) 1 % transdermal gel 4 g (has no administration in time range)  acetaminophen (TYLENOL)  tablet 650 mg (has no administration in time range)    Or  acetaminophen (TYLENOL) suppository 650 mg (has no administration in time range)  senna-docusate (Senokot-S) tablet 1 tablet (has no administration in time range)  ondansetron (ZOFRAN) tablet 4 mg (has no administration in time range)    Or  ondansetron (ZOFRAN) injection 4 mg (has no administration in time range)     ED Discharge Orders    None       Note:  This document was prepared using Dragon voice recognition software and may include unintentional dictation errors.   Vanessa Padroni, MD 07/02/19 1331

## 2019-07-02 NOTE — Progress Notes (Signed)
MD has acknowledged secure chat sent and no new orders.

## 2019-07-02 NOTE — H&P (Signed)
History and Physical    Leslie Houston SAY:[id number] DOB: 04/04/1951 DOA: 07/02/2019  PCP: Hubbard Hartshorn, FNP   Patient coming from: Home  I have personally briefly reviewed patient's old medical records in [place]  Chief Complaint: Shortness of breath  HPI: Leslie Houston is a 69 y.o. 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  Patient has had worsening lower extremity swelling and increased abdominal girth for about 2 months.  She also has exertional shortness of breath.  She has been on multiple oral diuretic therapy without any significant improvement in her swelling. She is also noted to have worsening of her renal function from her baseline and her creatinine has gone from 1.3 in 2019  >> 3.49 with a GFR of 15.103ml/min.  She has 2 pillow orthopnea but denies having any chest pain, nausea, vomiting, diaphoresis, palpitations or any changes in her bowel habits.  Denies having any cough, fever or chills.  Patient also has a 3 g drop in her H&H from 11.8g/dl to 8.7g/dl and potassium is slightly elevated at 5.3 She will be admitted to the hospital for further evaluation  ED Course: Patient seen in the emergency room for evaluation of worsening kidney function and concern for fluid overload.  X-ray showed interstitial edema and BNP is elevated.  She received a dose of Lasix 80 mg IV in the emergency room and nephrology has been consulted  Review of Systems: As per HPI otherwise 10 point review of systems negative.    Past Medical History:  Diagnosis Date  . CAD (coronary artery disease)    a. Pt reports in 2000 she had a stent to a coronary artery, details unclear, outside hospital.  . Carotid stenosis    a. CT angio head  07/2014 - 50-60% stenoses of prox ICA bilaterally.  . CHF (congestive heart failure) ([place])    a. Pt reports in 2000 she had CHF, details unclear, outside hospital.  . CKD (chronic kidney disease), stage III   . Diabetes mellitus ([place])   . Family hx of colon cancer 06/13/2018   Father diagnosed in his 74's  . Gout   . Hyperlipidemia   . Hypertension   . Orthostatic hypotension   . Osteopenia 01/24/2018   DEXA Sept 2019  . PUD (peptic ulcer disease)   . PVC's (premature ventricular contractions)   . Seizures ([place])   . Sinus bradycardia   . Stroke [facility])    a. L PCA CVA in 05/2014.  [name] Syncope     Past Surgical History:  Procedure Laterality Date  . CESAREAN SECTION    . DENTAL SURGERY    . Heart stent    . stint       reports that she quit smoking about 5 years ago. She has never used smokeless tobacco. She reports that she does not drink alcohol or use drugs.  Allergies  Allergen Reactions  . Penicillin G Itching  . Atorvastatin Other (See Comments)    Myalgias   . Other Hives    Adhesive tape    Family History  Problem Relation Age of Onset  . Stroke Mother   . Heart attack Mother   . Lung cancer Mother   . Stroke Father   . Prostate cancer Father   . Colon  cancer Father   . Diabetes Mellitus II Daughter   . Multiple sclerosis Daughter   . Kidney disease Son   . Heart failure Son   . Cancer Paternal Grandfather      Prior to Admission medications   Medication Sig Start Date End Date Taking? Authorizing Provider  amLODipine (NORVASC) 5 MG tablet Take 5 mg by mouth daily. 06/11/19  Yes [provider]  aspirin (ASPIR-LOW) 81 MG EC tablet Take 1 tablet (81 mg total) by mouth daily. Swallow whole. 07/03/18  Yes Suzzanne Cloud, NP  citalopram (CELEXA) 20 MG tablet Take 1 tablet once daily Patient taking differently: Take 20 mg by mouth daily. Take 1 tablet once daily 01/12/19  Yes Hubbard Hartshorn, FNP  ezetimibe (ZETIA) 10 MG tablet Take 10 mg by mouth daily.  06/11/19  Yes [provider]  levETIRAcetam (KEPPRA) 500 MG tablet One in the morning, 2 tabs at night Patient taking differently: Take 500-750 mg by mouth 2 (two) times daily. Take one tablet (500 mg) in the morning, one and one-half tablets (750 mg)  at night 07/03/18  Yes Suzzanne Cloud, NP  olmesartan-hydrochlorothiazide (BENICAR HCT) 40-12.5 MG tablet TAKE 1 TABLET BY MOUTH ONCE DAILY 02/17/19  Yes Hubbard Hartshorn, FNP  diclofenac sodium (VOLTAREN) 1 % GEL Apply 4 g topically 4 (four) times daily. Patient taking differently: Apply 4 g topically 4 (four) times daily as needed.  01/22/19   Steele Sizer, MD  fluticasone (FLONASE) 50 MCG/ACT nasal spray Place 2 sprays into both nostrils daily as needed for allergies (allergies).     [provider]    Physical Exam: Vitals:   07/02/19 1130 07/02/19 1200 07/02/19 1402 07/02/19 1422  BP:   (!) 152/90 (!) 163/66  Pulse: (!) 50 (!) 50 (!) 50 (!) 51  Resp: 17 20 20 16   Temp:    98.5 F (36.9 C)  TempSrc:      SpO2: 97% 100% 98% 100%  Weight:      Height:         Vitals:   07/02/19 1130 07/02/19 1200 07/02/19 1402 07/02/19 1422  BP:   (!) 152/90 (!) 163/66  Pulse: (!) 50 (!) 50 (!) 50 (!) 51  Resp: 17 20 20 16   Temp:    98.5 F (36.9 C)  TempSrc:      SpO2: 97% 100% 98% 100%  Weight:      Height:        Constitutional: NAD, alert and oriented person place and time Eyes: PERRL, lids and conjunctivae pale ENMT: Mucous membranes are moist.  Neck: normal, supple, no masses, no thyromegaly Respiratory: Bilateral air entry, no wheezing, faint crackles at the bases. Normal respiratory effort. No accessory muscle use.  Cardiovascular: Bradycardic, no murmurs / rubs / gallops. 3+ extremity edema. 2+ pedal pulses. No carotid bruits.  Abdomen: no tenderness, no masses palpated. No hepatosplenomegaly. Bowel sounds positive.  Central adiposity Musculoskeletal: no clubbing / cyanosis. No joint deformity upper and lower  extremities.  Skin: no rashes, lesions, ulcers.  Neurologic: No gross focal neurologic deficit. Psychiatric: Normal mood and affect.   Labs on Admission: I have personally reviewed following labs and imaging studies  CBC: Recent Labs  Lab 07/02/19 1103  WBC 4.5  NEUTROABS 3.4  HGB 8.7*  HCT 26.1*  MCV 105.7*  PLT 127*   Basic Metabolic Panel: Recent Labs  Lab 07/02/19 1103  NA 138  K 5.2*  CL 114*  CO2 17*  GLUCOSE 87  BUN 53*  CREATININE 3.49*  CALCIUM 9.3   GFR: Estimated Creatinine Clearance: 15.4 mL/min (A) (by C-G formula based on SCr of 3.49 mg/dL (H)). Liver Function Tests: Recent Labs  Lab 07/02/19 1103  AST 23  ALT 26  ALKPHOS 100  BILITOT 0.6  PROT 7.4  ALBUMIN 4.1   No results for input(s): LIPASE, AMYLASE in the last 168 hours. No results for input(s): AMMONIA in the last 168 hours. Coagulation Profile: No results for input(s): INR, PROTIME in the last 168 hours. Cardiac Enzymes: No results for input(s): CKTOTAL, CKMB, CKMBINDEX, TROPONINI in the last 168 hours. BNP (last 3 results) No results for input(s): PROBNP in the last 8760 hours. HbA1C: No results for input(s): HGBA1C in the last 72 hours. CBG: No results for input(s): GLUCAP in the last 168 hours. Lipid Profile: No results for input(s): CHOL, HDL, LDLCALC, TRIG, CHOLHDL, LDLDIRECT in the last 72 hours. Thyroid Function Tests: Recent Labs    07/02/19 1103  TSH 2.892  FREET4 0.84   Anemia Panel: No results for input(s): VITAMINB12, FOLATE, FERRITIN, TIBC, IRON, RETICCTPCT in the last 72 hours. Urine analysis:    Component Value Date/Time   COLORURINE YELLOW (A) 07/02/2019 1148   APPEARANCEUR HAZY (A) 07/02/2019 1148   APPEARANCEUR Clear 08/19/2014 1205   LABSPEC 1.009 07/02/2019 1148   LABSPEC 1.017 08/19/2014 1205   PHURINE 5.0 07/02/2019 1148   GLUCOSEU NEGATIVE 07/02/2019 1148   GLUCOSEU Negative 08/19/2014 1205   HGBUR NEGATIVE 07/02/2019 1148   BILIRUBINUR  NEGATIVE 07/02/2019 1148   BILIRUBINUR Negative 08/19/2014 1205   KETONESUR NEGATIVE 07/02/2019 1148   PROTEINUR NEGATIVE 07/02/2019 1148   UROBILINOGEN 0.2 11/15/2014 1245   NITRITE NEGATIVE 07/02/2019 1148   LEUKOCYTESUR MODERATE (A) 07/02/2019 1148   LEUKOCYTESUR 1+ 08/19/2014 1205    Radiological Exams on Admission: DG Chest Portable 1 View  Result Date: 07/02/2019 CLINICAL DATA:  69 year old female with shortness of breath and worsening kidney function. EXAM: PORTABLE CHEST 1 VIEW COMPARISON:  Chest radiographs 11/15/2014 and earlier. FINDINGS: Portable AP semi upright view at 1110 hours. Stable cardiac size at the upper limits of normal. Other mediastinal contours are within normal limits. Visualized tracheal air column is within normal limits. Pulmonary vascularity appears mildly increased, including from a 2016 portable chest. No pneumothorax, pleural effusion or confluent pulmonary opacity. Negative visible bowel gas pattern. No acute osseous abnormality identified. IMPRESSION: Increased pulmonary vascularity or interstitial opacity from prior studies. Consider mild or developing interstitial edema, viral/atypical respiratory infection. Electronically Signed   By: Genevie Ann M.D.   On: 07/02/2019 11:18    EKG: Independently reviewed.  Sinus bradycardia  Assessment/Plan Principal Problem:   AKI (acute kidney injury) ([place]) Active Problems:   Essential hypertension   GERD   Seizures (HCC)   Anemia of chronic disease   Bradycardia   Acute CHF (congestive heart failure) (H[facility])    Acute on chronic kidney injury Patient noted to have worsening of her renal function with a baseline serum creatinine of 1.3 and now 3.4 on admission with a GFR of 52ml/min. She has fluid overload as evidenced by anasarca and interstitial edema on chest x-ray with an elevated BNP and mildly elevated potassium levels.  She has a normal anion gap metabolic acidosis (co2 17) Hold ARB/HCTZ combination We will  place patient on furosemide 80 mg IV twice daily Maintain fluid restriction Daily weights Will consult nephrology   Congestive heart failure Etiology unclear We will obtain 2D echocardiogram to  assess LVEF Patient not on a beta-blocker at this time due to bradycardia Patient was on an ARB which is currently on hold due to worsening renal function   Anemia of chronic kidney disease Patient has a 3 g drop in her H&H Will obtain Hemoccult and iron panel to rule out chronic blood loss   Bradycardia Patient with heart rate in the 50s at rest TSH is within normal limits and patient is not on any beta-blockers We will request cardiology consult for further evaluation   Seizure disorder Continue Keppra Seizure precautions    DVT prophylaxis: Heparin Code Status: Full code Family Communication: Plan of care was discussed with patient and her caregiver at the bedside.  Verbalized understanding and agreed with the plan Disposition Plan: Back to previous home environment Consults called: Nephrology    Faylinn Schwenn MD [facility]     07/02/2019, 2:30 PM

## 2019-07-02 NOTE — Progress Notes (Signed)
Pt had 3.89 sec pause as reported by CCMD. Pt asymptomatic and vs's stable. Pt has been running sinus brady with sinus arrthymia 50's. Text sent to MD via secure chat.

## 2019-07-02 NOTE — ED Notes (Signed)
Report given to Stephanie, RN.
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
## 2019-07-02 NOTE — Consult Note (Addendum)
Cardiology Consultation:   Patient ID: Leslie Houston MRN: [id number]; DOB: 15-Apr-1951  Admit date: 07/02/2019 Date of Consult: 07/02/2019  Primary Care Provider: Hubbard Hartshorn, FNP Primary Cardiologist: New to [facility] Physician requesting consult: Dr. Francine Graven Reason for consult: Bradycardia, leg edema/shortness of breath   Patient Profile:   Leslie Houston is a 69 y.o. female with a hx of chronic kidney disease, seizure disorder, prior stroke residual right-sided vision loss/hearing loss, presenting to the hospital with worsening renal function, worsening leg swelling shortness of breath symptoms over the past 2 months   History of Present Illness:   Ms. Dowler reports that she has been on diuretics as outpatient, has had no significant urination, legs continue to get more edematous Recent lab work creatinine 1.3 up to 3.49 with GFR of 15 Reports 2 pillow orthopnea, coughing Unable to walk very far without having to stop secondary to shortness of breath Legs feel very heavy and swollen, very uncomfortable Additional lab work showing BNP 180, hematocrit 26/hemoglobin 8.7 - troponin  Echocardiogram ordered, renal ultrasound ordered  On further discussion she reports significant family history of renal disease, several family members on dialysis  Reports having prior MI, details unavailable  Echocardiogram April 2016 normal LV function, mild to moderately dilated left atrium Grade 2 diastolic dysfunction at that time Echo was ordered for syncope  She denies any near-syncope or syncope recently, no orthostasis symptoms  Telemetry reviewed heart rate of 50, not on any rate controlling agents as outpatient  Heart Pathway Score:     Past Medical History:  Diagnosis Date  . CAD (coronary artery disease)    a. Pt reports in 2000 she had a stent to a coronary artery, details unclear, outside hospital.  . Carotid stenosis    a. CT angio head 07/2014 - 50-60% stenoses of  prox ICA bilaterally.  . CHF (congestive heart failure) ([place])    a. Pt reports in 2000 she had CHF, details unclear, outside hospital.  . CKD (chronic kidney disease), stage III   . Diabetes mellitus ([place])   . Family hx of colon cancer 06/13/2018   Father diagnosed in his 100's  . Gout   . Hyperlipidemia   . Hypertension   . Orthostatic hypotension   . Osteopenia 01/24/2018   DEXA Sept 2019  . PUD (peptic ulcer disease)   . PVC's (premature ventricular contractions)   . Seizures ([place])   . Sinus bradycardia   . Stroke [facility])    a. L PCA CVA in 05/2014.  [name] Syncope     Past Surgical History:  Procedure Laterality Date  . CESAREAN SECTION    . DENTAL SURGERY    . Heart stent    . stint       Home Medications:  Prior to Admission medications   Medication Sig Start Date End Date Taking? Authorizing Provider  amLODipine (NORVASC) 5 MG tablet Take 5 mg by mouth daily. 06/11/19  Yes [provider]  aspirin (ASPIR-LOW) 81 MG EC tablet Take 1 tablet (81 mg total) by mouth daily. Swallow whole. 07/03/18  Yes Suzzanne Cloud, NP  citalopram (CELEXA) 20 MG tablet Take 1 tablet once daily Patient taking differently: Take 20 mg by mouth daily. Take 1 tablet once daily 01/12/19  Yes Hubbard Hartshorn, FNP  ezetimibe (ZETIA) 10 MG tablet Take 10 mg by mouth daily. 06/11/19  Yes [provider]  levETIRAcetam (KEPPRA) 500 MG tablet One in the morning, 2 tabs at night Patient taking  differently: Take 500-750 mg by mouth 2 (two) times daily. Take one tablet (500 mg) in the morning, one and one-half tablets (750 mg)  at night 07/03/18  Yes Suzzanne Cloud, NP  olmesartan-hydrochlorothiazide (BENICAR HCT) 40-12.5 MG tablet TAKE 1 TABLET BY MOUTH ONCE DAILY 02/17/19  Yes Hubbard Hartshorn, FNP  diclofenac sodium (VOLTAREN) 1 % GEL Apply 4 g topically 4 (four) times daily. Patient taking differently: Apply 4 g topically 4 (four) times daily as needed.  01/22/19   Steele Sizer, MD  fluticasone  (FLONASE) 50 MCG/ACT nasal spray Place 2 sprays into both nostrils daily as needed for allergies (allergies).     [provider]    Inpatient Medications: Scheduled Meds: . aspirin EC  81 mg Oral Daily  . atorvastatin  10 mg Oral q1800  . citalopram  20 mg Oral Daily  . furosemide  80 mg Intravenous BID  . levETIRAcetam  500 mg Oral BID   Continuous Infusions:  PRN Meds: acetaminophen **OR** acetaminophen, diclofenac sodium, fluticasone, ondansetron **OR** ondansetron (ZOFRAN) IV, senna-docusate  Allergies:    Allergies  Allergen Reactions  . Penicillin G Itching  . Atorvastatin Other (See Comments)    Myalgias   . Other Hives    Adhesive tape    Social History:   Social History   Socioeconomic History  . Marital status: Single    Spouse name: Not on file  . Number of children: 2  . Years of education: 30  . Highest education level: Not on file  Occupational History  . Occupation: Disabled  Tobacco Use  . Smoking status: Former Smoker    Quit date: 03/30/2014    Years since quitting: 5.2  . Smokeless tobacco: Never Used  Substance and Sexual Activity  . Alcohol use: No  . Drug use: No  . Sexual activity: Not Currently  Other Topics Concern  . Not on file  Social History Narrative   Lives at home with her son's father.   Right-handed.   1 cup coffee per day.   Social Determinants of Health   Financial Resource Strain: High Risk  . Difficulty of Paying Living Expenses: Hard  Food Insecurity: Food Insecurity Present  . Worried About Charity fundraiser in the Last Year: Sometimes true  . Ran Out of Food in the Last Year: Sometimes true  Transportation Needs: Unmet Transportation Needs  . Lack of Transportation (Medical): Yes  . Lack of Transportation (Non-Medical): Yes  Physical Activity: Inactive  . Days of Exercise per Week: 0 days  . Minutes of Exercise per Session: 0 min  Stress: Stress Concern Present  . Feeling of Stress : Rather much    Social Connections: Unknown  . Frequency of Communication with Friends and Family: Patient refused  . Frequency of Social Gatherings with Friends and Family: Patient refused  . Attends Religious Services: Patient refused  . Active Member of Clubs or Organizations: Patient refused  . Attends Archivist Meetings: Patient refused  . Marital Status: Patient refused  Intimate Partner Violence: Not At Risk  . Fear of Current or Ex-Partner: No  . Emotionally Abused: No  . Physically Abused: No  . Sexually Abused: No    Family History:    Family History  Problem Relation Age of Onset  . Stroke Mother   . Heart attack Mother   . Lung cancer Mother   . Stroke Father   . Prostate cancer Father   . Colon cancer Father   .  Diabetes Mellitus II Daughter   . Multiple sclerosis Daughter   . Kidney disease Son   . Heart failure Son   . Cancer Paternal Grandfather      ROS:  Please see the history of present illness.  Review of Systems  Constitutional: Negative.        Weight gain  HENT: Negative.   Respiratory: Positive for shortness of breath.   Cardiovascular: Positive for leg swelling.  Gastrointestinal: Negative.   Musculoskeletal: Negative.   Neurological: Negative.   Psychiatric/Behavioral: Negative.   All other systems reviewed and are negative.   Physical Exam/Data:   Vitals:   07/02/19 1200 07/02/19 1402 07/02/19 1422 07/02/19 1500  BP:  (!) 152/90 (!) 163/66   Pulse: (!) 50 (!) 50 (!) 51   Resp: 20 20 16    Temp:   98.5 F (36.9 C)   TempSrc:      SpO2: 100% 98% 100%   Weight:    98.7 kg  Height:    5\' 1"  (1.549 m)    Intake/Output Summary (Last 24 hours) at 07/02/2019 1908 Last data filed at 07/02/2019 1800 Gross per 24 hour  Intake 240 ml  Output --  Net 240 ml   Last 3 Weights 07/02/2019 07/02/2019 01/29/2019  Weight (lbs) 217 lb 9.5 oz 200 lb 200 lb 8 oz  Weight (kg) 98.7 kg 90.719 kg 90.946 kg     Body mass index is 41.11 kg/m.  General:  Well  nourished, well developed, in no acute distress obese HEENT: normal Lymph: no adenopathy Neck: Unable to estimate JVD Endocrine:  No thryomegaly Vascular: No carotid bruits; FA pulses 2+ bilaterally without bruits  Cardiac:  normal S1, S2; RRR; no murmur  2+ pitting edema to the thighs bilaterally Lungs: Clear with scattered Rales at the bases Abd: soft, nontender, no hepatomegaly  Ext: no edema Musculoskeletal:  No deformities, BUE and BLE strength normal and equal Skin: warm and dry  Neuro:  CNs 2-12 intact, no focal abnormalities noted Psych:  Normal affect   EKG:  The EKG was personally reviewed and demonstrates:   Sinus bradycardia rate 51 bpm first-degree AV block, no significant ST-T wave changes  Telemetry:  Telemetry was personally reviewed and demonstrates: Sinus bradycardia rate 51 bpm  Relevant CV Studies: Echocardiogram pending  Laboratory Data:  High Sensitivity Troponin:   Recent Labs  Lab 07/02/19 1103 07/02/19 1525  TROPONINIHS 13 13     Chemistry Recent Labs  Lab 07/02/19 1103  NA 138  K 5.2*  CL 114*  CO2 17*  GLUCOSE 87  BUN 53*  CREATININE 3.49*  CALCIUM 9.3  GFRNONAA 13*  GFRAA 15*  ANIONGAP 7    Recent Labs  Lab 07/02/19 1103  PROT 7.4  ALBUMIN 4.1  AST 23  ALT 26  ALKPHOS 100  BILITOT 0.6   Hematology Recent Labs  Lab 07/02/19 1103  WBC 4.5  RBC 2.47*  HGB 8.7*  HCT 26.1*  MCV 105.7*  MCH 35.2*  MCHC 33.3  RDW 13.3  PLT 136*   BNP Recent Labs  Lab 07/02/19 1117  BNP 180.0*    DDimer No results for input(s): DDIMER in the last 168 hours.   Radiology/Studies:  DG Chest Portable 1 View  Result Date: 07/02/2019 CLINICAL DATA:  69 year old female with shortness of breath and worsening kidney function. EXAM: PORTABLE CHEST 1 VIEW COMPARISON:  Chest radiographs 11/15/2014 and earlier. FINDINGS: Portable AP semi upright view at 1110 hours. Stable cardiac size at  the upper limits of normal. Other mediastinal contours  are within normal limits. Visualized tracheal air column is within normal limits. Pulmonary vascularity appears mildly increased, including from a 2016 portable chest. No pneumothorax, pleural effusion or confluent pulmonary opacity. Negative visible bowel gas pattern. No acute osseous abnormality identified. IMPRESSION: Increased pulmonary vascularity or interstitial opacity from prior studies. Consider mild or developing interstitial edema, viral/atypical respiratory infection. Electronically Signed   By: Genevie Ann M.D.   On: 07/02/2019 11:18   {  Assessment and Plan:   1. Sinus bradycardia Grossly appears to be asymptomatic, blood pressure stable with systolic pressures 343 and higher, up to 568 systolic -Review of telemetry tomorrow with ambulation to check for chronotropic competence TSH within reasonable range -No indication at this time for pacemaker  2.  Leg swelling, shortness of breath High suspicion for worsening renal failure Agree with holding amlodipine Chest x-ray with interstitial edema Echocardiogram pending, renal ultrasound pending -Currently on Lasix IV twice daily, started making urine We will need to monitor renal function closely  3.  Acute on chronic renal failure GFR 15 recent Was on olmesartan HCTZ 40/12.5 mg as outpatient, this has been held Followed by nephrology, strong family history of kidney failure and numerous relatives on hemodialysis -Renal ultrasound pending -Unable to exclude cardiorenal syndrome, echo pending  4.  Anemia Likely secondary to renal disease Iron stable, guaiac stools  5.  Morbid obesity At this stage unable to ambulate well Discussion concerning lifestyle modification at a later date  6.  History of stroke  7.  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 -Options for medications include hydralazine, nitrates, Cardura, Would avoid clonidine, beta-blockers, calcium channel blockers, ACE inhibitors, ARB     Total encounter time more than 110 minutes  Greater than 50% was spent in counseling and coordination of care with the patient    For questions or updates, please contact [facility] Please consult [email or web address] for contact info under     Signed, Ida Rogue, MD  07/02/2019 7:08 PM

## 2019-07-03 ENCOUNTER — Encounter: Payer: Self-pay | Admitting: Internal Medicine

## 2019-07-03 ENCOUNTER — Inpatient Hospital Stay (HOSPITAL_COMMUNITY)
Admit: 2019-07-03 | Discharge: 2019-07-03 | Disposition: A | Discharge status: Home / Self Care | Payer: Medicare Other | Attending: Internal Medicine | Admitting: Internal Medicine

## 2019-07-03 DIAGNOSIS — I5031 Acute diastolic (congestive) heart failure: Secondary | ICD-10-CM

## 2019-07-03 DIAGNOSIS — D638 Anemia in other chronic diseases classified elsewhere: Secondary | ICD-10-CM | POA: Diagnosis not present

## 2019-07-03 DIAGNOSIS — N179 Acute kidney failure, unspecified: Secondary | ICD-10-CM | POA: Diagnosis not present

## 2019-07-03 DIAGNOSIS — I10 Essential (primary) hypertension: Secondary | ICD-10-CM | POA: Diagnosis not present

## 2019-07-03 DIAGNOSIS — R001 Bradycardia, unspecified: Secondary | ICD-10-CM | POA: Diagnosis not present

## 2019-07-03 DIAGNOSIS — E875 Hyperkalemia: Secondary | ICD-10-CM | POA: Diagnosis present

## 2019-07-03 DIAGNOSIS — I509 Heart failure, unspecified: Secondary | ICD-10-CM

## 2019-07-03 DIAGNOSIS — R0602 Shortness of breath: Secondary | ICD-10-CM | POA: Diagnosis not present

## 2019-07-03 DIAGNOSIS — R569 Unspecified convulsions: Secondary | ICD-10-CM

## 2019-07-03 LAB — URINE CULTURE: Culture: NO GROWTH

## 2019-07-03 LAB — ECHOCARDIOGRAM COMPLETE
Height: 61 in
Weight: 3456 oz

## 2019-07-03 LAB — CBC
HCT: 25 % — ABNORMAL LOW (ref 36.0–46.0)
Hemoglobin: 8.4 g/dL — ABNORMAL LOW (ref 12.0–15.0)
MCH: 36.4 pg — ABNORMAL HIGH (ref 26.0–34.0)
MCHC: 33.6 g/dL (ref 30.0–36.0)
MCV: 108.2 fL — ABNORMAL HIGH (ref 80.0–100.0)
Platelets: 129 10*3/uL — ABNORMAL LOW (ref 150–400)
RBC: 2.31 MIL/uL — ABNORMAL LOW (ref 3.87–5.11)
RDW: 13.3 % (ref 11.5–15.5)
WBC: 3.8 10*3/uL — ABNORMAL LOW (ref 4.0–10.5)
nRBC: 0 % (ref 0.0–0.2)

## 2019-07-03 LAB — BASIC METABOLIC PANEL
Anion gap: 4 — ABNORMAL LOW (ref 5–15)
BUN: 55 mg/dL — ABNORMAL HIGH (ref 8–23)
CO2: 21 mmol/L — ABNORMAL LOW (ref 22–32)
Calcium: 9.4 mg/dL (ref 8.9–10.3)
Chloride: 117 mmol/L — ABNORMAL HIGH (ref 98–111)
Creatinine, Ser: 3.95 mg/dL — ABNORMAL HIGH (ref 0.44–1.00)
GFR calc Af Amer: 13 mL/min — ABNORMAL LOW (ref >60)
GFR calc non Af Amer: 11 mL/min — ABNORMAL LOW (ref >60)
Glucose, Bld: 87 mg/dL (ref 70–99)
Potassium: 5.7 mmol/L — ABNORMAL HIGH (ref 3.5–5.1)
Sodium: 142 mmol/L (ref 135–145)

## 2019-07-03 MED ORDER — EPOETIN ALFA 10000 UNIT/ML IJ SOLN
10000.0000 [IU] | INTRAMUSCULAR | Status: DC
  Administered 2019-07-03: 15:00:00 10000 [IU] via SUBCUTANEOUS
  Filled 2019-07-03: qty 1

## 2019-07-03 MED ORDER — PATIROMER SORBITEX CALCIUM 8.4 G PO PACK
16.8000 g | PACK | Freq: Every day | ORAL | Status: DC
  Administered 2019-07-03 – 2019-07-06 (×4): 16.8 g via ORAL
  Filled 2019-07-03 (×4): qty 2

## 2019-07-03 MED ORDER — FUROSEMIDE 10 MG/ML IJ SOLN
40.0000 mg | Freq: Every day | INTRAMUSCULAR | Status: DC
  Administered 2019-07-03: 40 mg via INTRAVENOUS
  Filled 2019-07-03 (×3): qty 4

## 2019-07-03 NOTE — Progress Notes (Signed)
[facility], [place] 07/03/19  Subjective:   LOS: 1 03/04 0701 - 03/05 0700 In: 720 [P.O.:720] Out: 500 [Urine:500] Patient known to our practice from outpatient.  Called our office yesterday that she was progressively getting short of breath and had worsening lower extremity edema.  Therefore she was asked to come into the hospital for evaluation.  She received IV furosemide.  Today she feels better.  Feels that her pressure in her chest has improved.  On telemetry, she is noted to have a 7-second pause earlier today   Objective:  Vital signs in last 24 hours:  Temp:  [97.4 F (36.3 C)-98.5 F (36.9 C)] 97.4 F (36.3 C) (03/05 0749) Pulse Rate:  [49-53] 51 (03/05 0749) Resp:  [16-20] 18 (03/05 0749) BP: (138-163)/(50-90) 146/50 (03/05 0749) SpO2:  [96 %-100 %] 97 % (03/05 0749) Weight:  [90.7 kg-98.7 kg] 98 kg (03/05 0449)  Weight change:  Filed Weights   07/02/19 1050 07/02/19 1500 07/03/19 0449  Weight: 90.7 kg 98.7 kg 98 kg    Intake/Output:    Intake/Output Summary (Last 24 hours) at 07/03/2019 0820 Last data filed at 07/03/2019 0505 Gross per 24 hour  Intake 720 ml  Output 500 ml  Net 220 ml     Physical Exam: General:  Chronically ill-appearing, laying in the bed  HEENT  moist oral mucous membranes  Pulm/lungs  mild basilar crackles  CVS/Heart  irregular  Abdomen:   Soft, mildly distended, obese  Extremities:  1-2+ pitting edema  Neurologic:  Alert, able to answer questions appropriately  Skin:  Warm, dry  Access:        Basic Metabolic Panel:  Recent Labs  Lab 07/02/19 1103  NA 138  K 5.2*  CL 114*  CO2 17*  GLUCOSE 87  BUN 53*  CREATININE 3.49*  CALCIUM 9.3     CBC: Recent Labs  Lab 07/02/19 1103 07/03/19 0755  WBC 4.5 3.8*  NEUTROABS 3.4  --   HGB 8.7* 8.4*  HCT 26.1* 25.0*  MCV 105.7* 108.2*  PLT 136* 129*     No results found for: HEPBSAG, HEPBSAB, HEPBIGM    Microbiology:  Recent Results  (from the past 240 hour(s))  Respiratory Panel by RT PCR (Flu A&B, Covid) - Nasopharyngeal Swab     Status: None   Collection Time: 07/02/19 11:46 AM   Specimen: Nasopharyngeal Swab  Result Value Ref Range Status   SARS Coronavirus 2 by RT PCR NEGATIVE NEGATIVE Final    Comment: (NOTE) SARS-CoV-2 target nucleic acids are NOT DETECTED. The SARS-CoV-2 RNA is generally detectable in upper respiratoy specimens during the acute phase of infection. The lowest concentration of SARS-CoV-2 viral copies this assay can detect is 131 copies/mL. A negative result does not preclude SARS-Cov-2 infection and should not be used as the sole basis for treatment or other patient management decisions. A negative result may occur with  improper specimen collection/handling, submission of specimen other than nasopharyngeal swab, presence of viral mutation(s) within the areas targeted by this assay, and inadequate number of viral copies (<131 copies/mL). A negative result must be combined with clinical observations, patient history, and epidemiological information. The expected result is Negative. Fact Sheet for Patients:  PinkCheek.be Fact Sheet for Healthcare Providers:  GravelBags.it This test is not yet ap proved or cleared by the [place] FDA and  has been authorized for detection and/or diagnosis of SARS-CoV-2 by FDA under an Emergency Use Authorization (EUA). This EUA will remain  in effect (meaning this test can be used) for the duration of the COVID-19 declaration under Section 564(b)(1) of the Act, 21 U.S.C. section 360bbb-3(b)(1), unless the authorization is terminated or revoked sooner.    Influenza A by PCR NEGATIVE NEGATIVE Final   Influenza B by PCR NEGATIVE NEGATIVE Final    Comment: (NOTE) The Xpert Xpress SARS-CoV-2/FLU/RSV assay is intended as an aid in  the diagnosis of influenza from Nasopharyngeal swab specimens and   should not be used as a sole basis for treatment. Nasal washings and  aspirates are unacceptable for Xpert Xpress SARS-CoV-2/FLU/RSV  testing. Fact Sheet for Patients: PinkCheek.be Fact Sheet for Healthcare Providers: GravelBags.it This test is not yet approved or cleared by the [place] FDA and  has been authorized for detection and/or diagnosis of SARS-CoV-2 by  FDA under an Emergency Use Authorization (EUA). This EUA will remain  in effect (meaning this test can be used) for the duration of the  Covid-19 declaration under Section 564(b)(1) of the Act, 21  U.S.C. section 360bbb-3(b)(1), unless the authorization is  terminated or revoked. Performed at [facility], [address] [place], [place] [address]     Coagulation Studies: No results for input(s): LABPROT, INR in the last 72 hours.  Urinalysis: Recent Labs    07/02/19 1148  COLORURINE YELLOW*  LABSPEC 1.009  PHURINE 5.0  GLUCOSEU NEGATIVE  HGBUR NEGATIVE  BILIRUBINUR NEGATIVE  KETONESUR NEGATIVE  PROTEINUR NEGATIVE  NITRITE NEGATIVE  LEUKOCYTESUR MODERATE*      Imaging: DG Chest Portable 1 View  Result Date: 07/02/2019 CLINICAL DATA:  69 year old female with shortness of breath and worsening kidney function. EXAM: PORTABLE CHEST 1 VIEW COMPARISON:  Chest radiographs 11/15/2014 and earlier. FINDINGS: Portable AP semi upright view at 1110 hours. Stable cardiac size at the upper limits of normal. Other mediastinal contours are within normal limits. Visualized tracheal air column is within normal limits. Pulmonary vascularity appears mildly increased, including from a 2016 portable chest. No pneumothorax, pleural effusion or confluent pulmonary opacity. Negative visible bowel gas pattern. No acute osseous abnormality identified. IMPRESSION: Increased pulmonary vascularity or interstitial opacity from prior studies. Consider mild or developing  interstitial edema, viral/atypical respiratory infection. Electronically Signed   By: Genevie Ann M.D.   On: 07/02/2019 11:18     Medications:    . aspirin EC  81 mg Oral Daily  . atorvastatin  10 mg Oral q1800  . citalopram  20 mg Oral Daily  . levETIRAcetam  500 mg Oral BID  . sodium chloride flush  10 mL Intravenous Q12H   acetaminophen **OR** acetaminophen, diclofenac sodium, fluticasone, ondansetron **OR** ondansetron (ZOFRAN) IV, senna-docusate  Assessment/ Plan:  69 y.o. female with HTN, Carotid stenosis, previous h/o hydronephrosis, seizure, stroke, anxiety, CKD admitted with following issues  Principal Problem:   AKI (acute kidney injury) ([place]) Active Problems:   Essential hypertension   GERD   Seizures ([place])   Anemia of chronic disease   Bradycardia   #. AKI on CKD st 3 vs progression to stage 5 CKD Recent Labs    07/02/19 1103  CREATININE 3.49*  Baseline Creatinine from 01/07/2019 is 1.59/GFR 38 Progressively worsening since then Screening auto immune serologies, SPEP, UPEP  neg from 04/17/2019 Mildly elevated k/l ration likely secondary to CKD  Today's creatinine is worse to 3.95. There are no acute uremic symptoms but if serum creatinine trends continue to worsen and hyperkalemia is uncontrolled, she may need dialysis this admission   #. Anemia of CKD  Lab Results  Component Value Date   HGB 8.4 (L) 07/03/2019  Will start low-dose Epogen subcu, Friday weekly  #. HTN with lower extremity edema Agree with IV diuresis Cardiology evaluation is in progress.  Results of 2D echo are pending  #Hyperkalemia Start scheduled Veltassa   LOS: [place] 3/5/20218:20 [place], [place]

## 2019-07-03 NOTE — Progress Notes (Signed)
Progress Note  Patient Name: Leslie Houston Date of Encounter: 07/03/2019  Primary Cardiologist: New to [facility] - consult by Rockey Situ  Subjective   She reports significant improvement in her SOB this morning. No chest pain, dizziness, presyncope, or syncope. Lower extremity swelling persists, though is improved. Echo is pending. HGB continues to down trend from a prior of 11.8 in 12/2018 to a value of 8.4 this morning. Now pancytopenic. Potassium 5.2-->5.7 with noted hemolysis this morning. BUN/SCr 53/3.49-->55/3.95 with a prior SCr ~ 1.2-1.3. Please see detailed review of telemetry below.   Inpatient Medications    Scheduled Meds: . aspirin EC  81 mg Oral Daily  . atorvastatin  10 mg Oral q1800  . citalopram  20 mg Oral Daily  . furosemide  80 mg Intravenous BID  . levETIRAcetam  500 mg Oral BID  . sodium chloride flush  10 mL Intravenous Q12H   Continuous Infusions:  PRN Meds: acetaminophen **OR** acetaminophen, diclofenac sodium, fluticasone, ondansetron **OR** ondansetron (ZOFRAN) IV, senna-docusate   Vital Signs    Vitals:   07/02/19 2018 07/02/19 2117 07/03/19 0449 07/03/19 0749  BP: (!) 138/58 (!) 158/62 (!) 154/73 (!) 146/50  Pulse: (!) 49 (!) 52 (!) 50 (!) 51  Resp: 16  18 18   Temp: (!) 97.5 F (36.4 C) 97.7 F (36.5 C)  (!) 97.4 F (36.3 C)  TempSrc: Oral Oral  Oral  SpO2: 96% 96% 96% 97%  Weight:   98 kg   Height:        Intake/Output Summary (Last 24 hours) at 07/03/2019 0809 Last data filed at 07/03/2019 0505 Gross per 24 hour  Intake 720 ml  Output 500 ml  Net 220 ml   Filed Weights   07/02/19 1050 07/02/19 1500 07/03/19 0449  Weight: 90.7 kg 98.7 kg 98 kg    Telemetry    Sinus rhythm with sinus arrhythmia noted, 1st degree AV block, possible short run of Mobitz type I AV block in the early morning hours, sinus arrest with blocked PAC with longest pause of 7.4 seconds around 5:19 AM this morning - Personally Reviewed  ECG    No new tracings -  Personally Reviewed  Physical Exam   GEN: No acute distress.   Neck: JVD mildly elevated. Cardiac: RRR, no murmurs, rubs, or gallops.  Respiratory: Coarse breath sounds bilaterally.  GI: Soft, nontender, non-distended.   MS: 1+ bilateral lower extremity edema; No deformity. Neuro:  Alert and oriented x 3; Nonfocal.  Psych: Normal affect.  Labs    Chemistry Recent Labs  Lab 07/02/19 1103  NA 138  K 5.2*  CL 114*  CO2 17*  GLUCOSE 87  BUN 53*  CREATININE 3.49*  CALCIUM 9.3  PROT 7.4  ALBUMIN 4.1  AST 23  ALT 26  ALKPHOS 100  BILITOT 0.6  GFRNONAA 13*  GFRAA 15*  ANIONGAP 7     Hematology Recent Labs  Lab 07/02/19 1103  WBC 4.5  RBC 2.47*  HGB 8.7*  HCT 26.1*  MCV 105.7*  MCH 35.2*  MCHC 33.3  RDW 13.3  PLT 136*    Cardiac EnzymesNo results for input(s): TROPONINI in the last 168 hours. No results for input(s): TROPIPOC in the last 168 hours.   BNP Recent Labs  Lab 07/02/19 1117  BNP 180.0*     DDimer No results for input(s): DDIMER in the last 168 hours.   Radiology    DG Chest Portable 1 View  Result Date: 07/02/2019 IMPRESSION: Increased pulmonary  vascularity or interstitial opacity from prior studies. Consider mild or developing interstitial edema, viral/atypical respiratory infection. Electronically Signed   By: Genevie Ann M.D.   On: 07/02/2019 11:18    Cardiac Studies   2D echo pending  Patient Profile     69 y.o. female with history of CVA in 05/2014, recurrent syncope, CKD stage II, carotid artery stenosis, DM2, PUD, HTN, orthostatic hypotension, and obesity who we are seeing for bradycardia and swelling.   Assessment & Plan    1. Sinus pauses/bradycardia: -She appears to be asymptomatic with her pauses/possibly sleeping  -She has previously had to stop beta blocker secondary to underlying bradycardic heart rates as noted in cardiology note from 09/03/2014 -Cannot exclude degree of undiagnosed sleep apnea with her pauses noted this  morning and patient reported sleeping. She report a prior sleep study years ago that she indicates did not show evidence of sleep apnea. Nonetheless, she will require outpatient sleep study  -Likely exacerbated by her hyperkalemia, sample with hemolysis this morning, repeat as indicated  -TSH normal this admission -Continue to avoid AV nodal blocking agents -No emergent indication for venous temp wire or PPM at this time -Hemodynamically stable, no indication for dopamine currently  -Atropine prn -EP is rounding this weekend for further input  2. Acute on CKD stage II: -Renal function continues to worsen -Previously on olmesartan, this has been held -Consider renal artery ultrasound for evaluation of RAS -Nephrology consulted  3. Anemia: -HGB of 11.8 in 12/2018 with a current value of 8.4 -She denies hematochezia or melena -Occult stool pending  -Workup per IM -Cannot exclude some degree of third spacing  4. Dyspnea/lower extremity swelling: -She certainly does appear some degree of volume overloaded, though her ARF makes active diuresis difficult at this time -We have held her IV Lasix for this morning -Await nephrology input  -Less likely cardiorenal with SBP in the 160s and prior echo demonstrating preserved LVSF  5. Morbid obesity: -Cannot exclude sleep apnea/OHS -Weight loss advised -Plan for outpatient sleep study  6. HTN: -Avoid beta blockers, non-dihydropyridine calcium channel blockers, clonidine, ACEi/ARB -Could use hydralazine to start  7. Hyperkalemia: -In the setting of her renal failure -Veltassa has been ordered   8. Prior CVA: -ASA -Lipitor    For questions or updates, please contact [place] Please consult [email or web address] for contact info under Cardiology/STEMI.    Signed, Christell Faith, PA-C [place] Pager: [phone] 07/03/2019, 8:09 AM

## 2019-07-03 NOTE — Progress Notes (Addendum)
Progress Note    Leslie Houston  XTK:[id number] DOB: [birth year]  DOA: 07/02/2019 PCP: Hubbard Hartshorn, FNP      Brief Narrative:    Medical records reviewed and are as summarized below:  Leslie Houston is an 69 y.o. 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  Patient has had exertional shortness of breath and worsening lower extremity swelling and increased abdominal girth for about 2 months.    She had been on multiple oral diuretic therapy without any significant improvement in her swelling. She is also noted to have worsening of her renal function from her baseline and her creatinine has gone from 1.3 in 2019  >> 3.49 with a GFR of 15.42ml/min.  She had 2 pillow orthopnea but did not have any chest  pain, nausea, vomiting, diaphoresis, palpitations or any changes in her bowel habits. Patient also has a 3 g drop in her H&H from 11.8g/dl to 8.7g/dl and potassium is slightly elevated at 5.3      Assessment/Plan:   Principal Problem:   AKI (acute kidney injury) ([facility]) Active Problems:   Essential hypertension   GERD   Seizures (HCC)   Anemia of chronic disease   Bradycardia   Hyperkalemia   Sinus bradycardia with sinus pauses: Asymptomatic.  Patient was noted to have a 5-second pause and 7-second pause on telemetry that occurred between 5 PM and 7 AM today.  2D echo showed EF estimated at 50 to 99%, grade 2 diastolic dysfunction. Patient has been evaluated by cardiologist and pacemaker is not recommended at this time.  AKI on CKD stage III versus progression to stage V CKD/hyperkalemia/peripheral edema: Creatinine and potassium are worse today.  Case discussed with Dr. Candiss Norse, nephrologist, recommended IV Lasix.  Patient has  also been started on Veltassa for hyperkalemia.  Acute on chronic diastolic CHF/peripheral edema: Started on IV Lasix.  2D echo showed EF estimated at 50 to 55% and grade 2 diastolic dysfunction.  Acute on chronic anemia/anemia of chronic kidney disease: Monitor H&H.  Seizure disorder: Continue Keppra.  Hypertension: Monitor BP closely.  Patient is on Lasix.  Body mass index is 40.81 kg/m.  (Morbid obesity).  Weight loss advised.  Outpatient sleep study recommended to exclude obstructive sleep apnea   Family Communication/Anticipated D/C date and plan/Code Status   DVT prophylaxis:  Code Status: Full code Family Communication: Plan discussed with patient Disposition Plan: Patient is from home with plan to discharge home.  She will be discharged home when cleared by cardiologist and nephrologist.     Subjective:   No new complaints.  Shortness of breath has improved.  She still has some swelling in the legs.  No chest pain, palpitations or dizziness.  Patient said she was not aware that she had sinus pauses this morning.  However, she said that in the past, her heart rate has been low.  Objective:    Vitals:   07/02/19 2117 07/03/19 0449 07/03/19 0749 07/03/19 1208  BP: (!) 158/62 (!) 154/73 (!) 146/50 (!) 148/75  Pulse: (!) 52 (!) 50 (!) 51 (!) 53  Resp:  18 18 18   Temp: 97.7 F (36.5 C)  (!) 97.4 F (36.3 C) (!) 97.5 F (36.4 C)  TempSrc: Oral  Oral Oral  SpO2: 96% 96% 97% 96%  Weight:  98 kg    Height:        Intake/Output Summary (Last 24 hours) at 07/03/2019 1540 Last data filed at 07/03/2019 1100 Gross per 24 hour  Intake 960 ml  Output 900 ml  Net 60 ml   Filed Weights   07/02/19 1050 07/02/19 1500 07/03/19 0449  Weight: 90.7 kg 98.7 kg 98 kg    Exam:  GEN: NAD SKIN: No rash EYES: EOMI ENT: MMM CV: RRR PULM: CTA B ABD: soft, obese, NT, +BS CNS: AAO x 3, non focal EXT: Bilateral leg edema (1+), no tenderness   Data Reviewed:   I have  personally reviewed following labs and imaging studies:  Labs: Labs show the following:   Basic Metabolic Panel: Recent Labs  Lab 07/02/19 1103 07/03/19 0755  NA 138 142  K 5.2* 5.7*  CL 114* 117*  CO2 17* 21*  GLUCOSE 87 87  BUN 53* 55*  CREATININE 3.49* 3.95*  CALCIUM 9.3 9.4   GFR Estimated Creatinine Clearance: 14.2 mL/min (A) (by C-G formula based on SCr of 3.95 mg/dL (H)). Liver Function Tests: Recent Labs  Lab 07/02/19 1103  AST 23  ALT 26  ALKPHOS 100  BILITOT 0.6  PROT 7.4  ALBUMIN 4.1   No results for input(s): LIPASE, AMYLASE in the last 168 hours. No results for input(s): AMMONIA in the last 168 hours. Coagulation profile No results for input(s): INR, PROTIME in the last 168 hours.  CBC: Recent Labs  Lab 07/02/19 1103 07/03/19 0755  WBC 4.5 3.8*  NEUTROABS 3.4  --   HGB 8.7* 8.4*  HCT 26.1* 25.0*  MCV 105.7* 108.2*  PLT 136* 129*   Cardiac Enzymes: No results for input(s): CKTOTAL, CKMB, CKMBINDEX, TROPONINI in the last 168 hours. BNP (last 3 results) No results for input(s): PROBNP in the last 8760 hours. CBG: No results for input(s): GLUCAP in the last 168 hours. D-Dimer: No results for input(s): DDIMER in the last 72 hours. Hgb A1c: No results for input(s): HGBA1C in the last 72 hours. Lipid Profile: No results for input(s): CHOL, HDL, LDLCALC, TRIG, CHOLHDL, LDLDIRECT in the last 72 hours. Thyroid function studies: Recent Labs    07/02/19 1103  TSH 2.892   Anemia work up: Recent Labs    07/02/19 1525  TIBC 302  IRON 62   Sepsis Labs: Recent Labs  Lab 07/02/19 1103 07/03/19 0755  WBC 4.5 3.8*    Microbiology Recent Results (from the past 240 hour(s))  Respiratory Panel by RT PCR (Flu A&B, Covid) - Nasopharyngeal Swab     Status: None   Collection Time: 07/02/19 11:46 AM   Specimen: Nasopharyngeal Swab  Result Value Ref Range Status   SARS Coronavirus 2 by RT PCR NEGATIVE NEGATIVE Final    Comment:  (NOTE) SARS-CoV-2 target nucleic acids are NOT DETECTED. The SARS-CoV-2 RNA is generally detectable in upper respiratoy specimens during the acute phase of infection. The lowest concentration of SARS-CoV-2 viral copies this assay can detect is 131 copies/mL. A negative result does not preclude SARS-Cov-2 infection and should not be used as the sole basis for treatment or other patient management decisions. A negative result may occur with  improper specimen collection/handling, submission of specimen other than nasopharyngeal swab, presence of viral mutation(s) within the areas targeted by this assay, and inadequate number of viral copies (<131 copies/mL). A negative result must be combined with clinical observations, patient history, and epidemiological information. The expected result is Negative. Fact Sheet for Patients:  PinkCheek.be Fact Sheet for Healthcare Providers:  GravelBags.it This test is not yet ap proved or cleared by the [place] FDA and  has been authorized for detection and/or diagnosis of SARS-CoV-2 by FDA under an Emergency Use Authorization (EUA). This EUA will remain  in effect (meaning this test can be used) for the duration of the COVID-19 declaration under Section 564(b)(1) of the Act, 21 U.S.C. section 360bbb-3(b)(1), unless the authorization is terminated or revoked sooner.    Influenza A by PCR NEGATIVE NEGATIVE Final   Influenza B by PCR NEGATIVE NEGATIVE Final    Comment: (NOTE) The Xpert Xpress SARS-CoV-2/FLU/RSV assay is intended as an aid in  the diagnosis of influenza from Nasopharyngeal swab specimens and  should not be used as a sole basis for treatment. Nasal washings and  aspirates are unacceptable for Xpert Xpress SARS-CoV-2/FLU/RSV  testing. Fact Sheet for Patients: PinkCheek.be Fact Sheet for Healthcare  Providers: GravelBags.it This test is not yet approved or cleared by the [place] FDA and  has been authorized for detection and/or diagnosis of SARS-CoV-2 by  FDA under an Emergency Use Authorization (EUA). This EUA will remain  in effect (meaning this test can be used) for the duration of the  Covid-19 declaration under Section 564(b)(1) of the Act, 21  U.S.C. section 360bbb-3(b)(1), unless the authorization is  terminated or revoked. Performed at [facility], [address], [place], [place] [address]   Urine culture     Status: None   Collection Time: 07/02/19 11:48 AM   Specimen: Urine, Random  Result Value Ref Range Status   Specimen Description   Final    URINE, RANDOM Performed at [facility], [address], [place], [place] [address]    Special Requests   Final    NONE Performed at [facility], [address], [place], [place] [address]    Culture   Final    NO GROWTH Performed at [facility], [place] [address], [place],  [address]    Report Status 07/03/2019 FINAL  Final    Procedures and diagnostic studies:  DG Chest Portable 1 View  Result Date: 07/02/2019 CLINICAL DATA:  68 year old female with shortness of breath and worsening kidney function. EXAM: PORTABLE CHEST 1 VIEW COMPARISON:  Chest radiographs 11/15/2014 and earlier. FINDINGS: Portable AP semi upright view at 1110 hours. Stable cardiac size at the upper limits of normal. Other mediastinal contours are within normal limits. Visualized tracheal air column is within normal limits. Pulmonary vascularity appears mildly increased, including from a 2016 portable chest. No pneumothorax, pleural effusion or confluent pulmonary opacity. Negative visible bowel gas pattern. No acute osseous abnormality identified. IMPRESSION: Increased pulmonary vascularity or interstitial opacity from prior studies. Consider mild or developing interstitial  edema, viral/atypical respiratory infection. Electronically Signed   By: Genevie Ann M.D.   On: 07/02/2019 11:18   ECHOCARDIOGRAM COMPLETE  Result Date: 07/03/2019    ECHOCARDIOGRAM REPORT   Patient Name:   SHAJUAN MUSSO Date of Exam: 07/03/2019 Medical Rec #:  [id number]        Height:       61.0 in Accession #:    [id number]       Weight:       216.0 lb Date of Birth:  1950/06/24       BSA:          1.952 m Patient Age:    16 years         BP:  146/50 mmHg Patient Gender: F                HR:           51 bpm. Exam Location:  [facility] Procedure: 2D Echo, Color Doppler and Cardiac Doppler Indications:     I50.31 CHF-Acute Diastolic  History:         Patient has no prior history of Echocardiogram examinations.                  CHF, CAD, Stroke and PVD, CKD; Risk Factors:Hypertension,                  Dyslipidemia and Diabetes.  Sonographer:     Charmayne Sheer RDCS (AE) Referring Phys:  [id number] Collier Bullock Diagnosing Phys: Harrell Gave End MD  Sonographer Comments: Suboptimal parasternal window and suboptimal subcostal window. IMPRESSIONS  1. Left ventricular ejection fraction, by estimation, is 50 to 55%. The left ventricle has low normal function. The left ventricle has no regional wall motion abnormalities. Left ventricular diastolic parameters are consistent with Grade II diastolic dysfunction (pseudonormalization).  2. Right ventricular systolic function is normal. The right ventricular size is normal. Tricuspid regurgitation signal is inadequate for assessing PA pressure.  3. Left atrial size was mildly dilated.  4. The mitral valve is degenerative. Mild mitral valve regurgitation. No evidence of mitral stenosis.  5. The aortic valve is tricuspid. Aortic valve regurgitation is not visualized. No aortic stenosis is present.  6. The inferior vena cava is normal in size with <50% respiratory variability, suggesting right atrial pressure of 8 mmHg. FINDINGS  Left Ventricle: Left ventricular ejection fraction, by  estimation, is 50 to 55%. The left ventricle has low normal function. The left ventricle has no regional wall motion abnormalities. The left ventricular internal cavity size was normal in size. There is no left ventricular hypertrophy. Left ventricular diastolic parameters are consistent with Grade II diastolic dysfunction (pseudonormalization). Right Ventricle: The right ventricular size is normal. No increase in right ventricular wall thickness. Right ventricular systolic function is normal. Tricuspid regurgitation signal is inadequate for assessing PA pressure. Left Atrium: Left atrial size was mildly dilated. Right Atrium: Right atrial size was normal in size. Pericardium: There is no evidence of pericardial effusion. Mitral Valve: The mitral valve is degenerative in appearance. There is mild thickening of the mitral valve leaflet(s). Mild mitral annular calcification. Mild mitral valve regurgitation. No evidence of mitral valve stenosis. MV peak gradient, 5.0 mmHg. The mean mitral valve gradient is 3.0 mmHg. Tricuspid Valve: The tricuspid valve is not well visualized. Tricuspid valve regurgitation is mild. Aortic Valve: The aortic valve is tricuspid. Aortic valve regurgitation is not visualized. No aortic stenosis is present. There is mild calcification of the aortic valve. Aortic valve mean gradient measures 5.0 mmHg. Aortic valve peak gradient measures 12.7 mmHg. Aortic valve area, by VTI measures 2.05 cm. Pulmonic Valve: The pulmonic valve was not well visualized. Pulmonic valve regurgitation is not visualized. No evidence of pulmonic stenosis. Aorta: The aortic root is normal in size and structure. Pulmonary Artery: The pulmonary artery is not well seen. Venous: The inferior vena cava is normal in size with less than 50% respiratory variability, suggesting right atrial pressure of 8 mmHg. IAS/Shunts: The interatrial septum was not well visualized.  LEFT VENTRICLE PLAX 2D LVIDd:         4.67 cm      Diastology LVIDs:         3.69 cm  LV e' lateral:   8.49 cm/s LV PW:         1.00 cm     LV E/e' lateral: 11.3 LV IVS:        0.64 cm     LV e' medial:    7.94 cm/s LVOT diam:     1.90 cm     LV E/e' medial:  12.0 LV SV:         86 LV SV Index:   44 LVOT Area:     2.84 cm  LV Volumes (MOD) LV vol d, MOD A2C: 73.1 ml LV vol d, MOD A4C: 89.0 ml LV vol s, MOD A2C: 36.4 ml LV vol s, MOD A4C: 44.7 ml LV SV MOD A2C:     36.7 ml LV SV MOD A4C:     89.0 ml LV SV MOD BP:      44.5 ml RIGHT VENTRICLE RV Basal diam:  3.31 cm LEFT ATRIUM             Index       RIGHT ATRIUM           Index LA diam:        3.90 cm 2.00 cm/m  RA Area:     14.70 cm LA Vol (A2C):   47.9 ml 24.54 ml/m RA Volume:   35.30 ml  18.09 ml/m LA Vol (A4C):   79.1 ml 40.53 ml/m LA Biplane Vol: 68.5 ml 35.10 ml/m  AORTIC VALVE                    PULMONIC VALVE AV Area (Vmax):    2.28 cm     PV Vmax:       1.03 m/s AV Area (Vmean):   2.05 cm     PV Vmean:      71.600 cm/s AV Area (VTI):     2.05 cm     PV VTI:        0.236 m AV Vmax:           178.00 cm/s  PV Peak grad:  4.2 mmHg AV Vmean:          109.000 cm/s PV Mean grad:  2.0 mmHg AV VTI:            0.421 m AV Peak Grad:      12.7 mmHg AV Mean Grad:      5.0 mmHg LVOT Vmax:         143.00 cm/s LVOT Vmean:        79.000 cm/s LVOT VTI:          0.305 m LVOT/AV VTI ratio: 0.72  AORTA Ao Root diam: 3.00 cm MITRAL VALVE MV Area (PHT): 2.60 cm    SHUNTS MV Peak grad:  5.0 mmHg    Systemic VTI:  0.30 m MV Mean grad:  3.0 mmHg    Systemic Diam: 1.90 cm MV Vmax:       1.12 m/s MV Vmean:      76.0 cm/s MV Decel Time: 292 msec MV E velocity: 95.55 cm/s MV A velocity: 82.00 cm/s MV E/A ratio:  1.17 Harrell Gave End MD Electronically signed by Nelva Bush MD Signature Date/Time: 07/03/2019/1:52:19 PM    Final     Medications:   . aspirin EC  81 mg Oral Daily  . atorvastatin  10 mg Oral q1800  . citalopram  20 mg Oral Daily  . epoetin (EPOGEN/PROCRIT) injection  10,000 Units Subcutaneous Weekly   .  furosemide  40 mg Intravenous Daily  . levETIRAcetam  500 mg Oral BID  . patiromer  16.8 g Oral Daily  . sodium chloride flush  10 mL Intravenous Q12H   Continuous Infusions:   LOS: 1 day   Lyle Leisner  [facility]     07/03/2019, 3:40 PM

## 2019-07-03 NOTE — Progress Notes (Signed)
*  PRELIMINARY RESULTS* Echocardiogram 2D Echocardiogram has been performed.  Leslie Houston 07/03/2019, 11:18 AM

## 2019-07-04 ENCOUNTER — Inpatient Hospital Stay: Payer: Medicare Other

## 2019-07-04 DIAGNOSIS — I10 Essential (primary) hypertension: Secondary | ICD-10-CM | POA: Diagnosis not present

## 2019-07-04 DIAGNOSIS — D638 Anemia in other chronic diseases classified elsewhere: Secondary | ICD-10-CM | POA: Diagnosis not present

## 2019-07-04 DIAGNOSIS — R001 Bradycardia, unspecified: Secondary | ICD-10-CM | POA: Diagnosis not present

## 2019-07-04 DIAGNOSIS — N179 Acute kidney failure, unspecified: Secondary | ICD-10-CM | POA: Diagnosis not present

## 2019-07-04 DIAGNOSIS — R0602 Shortness of breath: Secondary | ICD-10-CM | POA: Diagnosis not present

## 2019-07-04 LAB — BASIC METABOLIC PANEL
Anion gap: 11 (ref 5–15)
BUN: 60 mg/dL — ABNORMAL HIGH (ref 8–23)
CO2: 17 mmol/L — ABNORMAL LOW (ref 22–32)
Calcium: 9.8 mg/dL (ref 8.9–10.3)
Chloride: 112 mmol/L — ABNORMAL HIGH (ref 98–111)
Creatinine, Ser: 4.06 mg/dL — ABNORMAL HIGH (ref 0.44–1.00)
GFR calc Af Amer: 12 mL/min — ABNORMAL LOW (ref >60)
GFR calc non Af Amer: 10 mL/min — ABNORMAL LOW (ref >60)
Glucose, Bld: 79 mg/dL (ref 70–99)
Potassium: 4.6 mmol/L (ref 3.5–5.1)
Sodium: 140 mmol/L (ref 135–145)

## 2019-07-04 MED ORDER — CHLORHEXIDINE GLUCONATE CLOTH 2 % EX PADS: 6.0000 | MEDICATED_PAD | Freq: Every day | CUTANEOUS | Status: DC

## 2019-07-04 MED ORDER — CHLORHEXIDINE GLUCONATE CLOTH 2 % EX PADS
6.0000 | MEDICATED_PAD | Freq: Every day | CUTANEOUS | Status: DC
  Administered 2019-07-05 – 2019-07-07 (×3): 6 via TOPICAL

## 2019-07-04 NOTE — Progress Notes (Signed)
Progress Note  Patient Name: Leslie Houston Date of Encounter: 07/04/2019  Primary Cardiologist: New to [facility] - consult by [facility]  Subjective   Shortness of breath has continued to improve.  Lower extremity persists though it has improved.  Echo shows a low normal ejection fraction with low right atrial pressure.   Inpatient Medications    Scheduled Meds: . aspirin EC  81 mg Oral Daily  . atorvastatin  10 mg Oral q1800  . citalopram  20 mg Oral Daily  . epoetin (EPOGEN/PROCRIT) injection  10,000 Units Subcutaneous Weekly  . furosemide  40 mg Intravenous Daily  . levETIRAcetam  500 mg Oral BID  . patiromer  16.8 g Oral Daily  . sodium chloride flush  10 mL Intravenous Q12H   Continuous Infusions:  PRN Meds: acetaminophen **OR** acetaminophen, diclofenac sodium, fluticasone, ondansetron **OR** ondansetron (ZOFRAN) IV, senna-docusate   Vital Signs    Vitals:   07/03/19 1952 07/04/19 0527 07/04/19 0816 07/04/19 1159  BP: (!) 147/66 (!) 151/79 128/67 (!) 150/66  Pulse: (!) 46 (!) 50 (!) 51 (!) 51  Resp: 20 16 18 18   Temp: (!) 97.4 F (36.3 C) 98.1 F (36.7 C) 97.7 F (36.5 C)   TempSrc: Oral Oral    SpO2: 96% 96% 97% 98%  Weight:  97.6 kg    Height:        Intake/Output Summary (Last 24 hours) at 07/04/2019 1318 Last data filed at 07/04/2019 1136 Gross per 24 hour  Intake 838 ml  Output 1050 ml  Net -212 ml   Filed Weights   07/02/19 1500 07/03/19 0449 07/04/19 0527  Weight: 98.7 kg 98 kg 97.6 kg    Telemetry    Sinus rhythm with sinus arrhythmia.  No further pauses.- Personally Reviewed  ECG    No new tracings - Personally Reviewed  Physical Exam   GEN: Well nourished, well developed, in no acute distress  HEENT: normal  Neck: no JVD, carotid bruits, or masses Cardiac: RRR; no murmurs, rubs, or gallops, trace to 1+ edema  Respiratory: Mildly coarse breath sounds GI: soft, nontender, nondistended, + BS MS: no deformity or atrophy  Skin: warm and  dry Neuro:  Strength and sensation are intact Psych: euthymic mood, full affect   Labs    Chemistry Recent Labs  Lab 07/02/19 1103 07/03/19 0755 07/04/19 0606  NA 138 142 140  K 5.2* 5.7* 4.6  CL 114* 117* 112*  CO2 17* 21* 17*  GLUCOSE 87 87 79  BUN 53* 55* 60*  CREATININE 3.49* 3.95* 4.06*  CALCIUM 9.3 9.4 9.8  PROT 7.4  --   --   ALBUMIN 4.1  --   --   AST 23  --   --   ALT 26  --   --   ALKPHOS 100  --   --   BILITOT 0.6  --   --   GFRNONAA 13* 11* 10*  GFRAA 15* 13* 12*  ANIONGAP 7 4* 11     Hematology Recent Labs  Lab 07/02/19 1103 07/03/19 0755  WBC 4.5 3.8*  RBC 2.47* 2.31*  HGB 8.7* 8.4*  HCT 26.1* 25.0*  MCV 105.7* 108.2*  MCH 35.2* 36.4*  MCHC 33.3 33.6  RDW 13.3 13.3  PLT 136* 129*    Cardiac EnzymesNo results for input(s): TROPONINI in the last 168 hours. No results for input(s): TROPIPOC in the last 168 hours.   BNP Recent Labs  Lab 07/02/19 1117  BNP 180.0*  DDimer No results for input(s): DDIMER in the last 168 hours.   Radiology    DG Chest Portable 1 View  Result Date: 07/02/2019 IMPRESSION: Increased pulmonary vascularity or interstitial opacity from prior studies. Consider mild or developing interstitial edema, viral/atypical respiratory infection. Electronically Signed   By: Genevie Ann M.D.   On: 07/02/2019 11:18    Cardiac Studies   2D echo pending  Patient Profile     69 y.o. female with history of CVA in 05/2014, recurrent syncope, CKD stage II, carotid artery stenosis, DM2, PUD, HTN, orthostatic hypotension, and obesity who we are seeing for bradycardia and swelling.   Assessment & Plan    1. Sinus pauses/bradycardia:  Asymptomatic with her pauses which appeared to be during sleeping hours.  Pauses are preceded by as needed RR lengthening likely due to a vagal reaction.  At this point, pacing is not indicated.  Would have her evaluated for obstructive sleep apnea as an outpatient.  2. Acute renal failure on CKD  stage II: Creatinine continues to worsen.  Currently holding olmesartan.  Nephrology has been consulted.-Renal function continues to worsen   3. Anemia: Hemoglobin decreased to 8.4.  Plan per primary team.  4. Dyspnea/lower extremity swelling: Breathing improved.  Continues to have some degree of volume overload.  Volume status per nephrology.  5. Morbid obesity: Weight loss advised.  Noreta Kue need outpatient sleep study.  6. HTN:  Avoiding most antihypertensives in the setting of acute renal failure.  7. Hyperkalemia:  Veltassa has been ordered.  Has occurred in the setting of acute renal failure.  8. Prior CVA: Continue aspirin and Lipitor   For questions or updates, please contact [place] Please consult [email or web address] for contact info under Cardiology/STEMI.    Signed, Christell Faith, PA-C [place] Pager: [phone] 07/04/2019, 1:18 PM

## 2019-07-04 NOTE — Progress Notes (Signed)
[facility], [place] 07/04/19  Subjective:   LOS: 2 03/05 0701 - 03/06 0700 In: 3664 [P.O.:1078] Out: 1000 [Urine:1000] Renal function continues to be quite low. BUN currently 60 with a creatinine of 4.06 and EGFR of 12. Urine output was 1 L over the preceding 24 hours. Hyperkalemia improved with potassium of 4.6.   Objective:  Vital signs in last 24 hours:  Temp:  [97.4 F (36.3 C)-98.1 F (36.7 C)] 97.7 F (36.5 C) (03/06 0816) Pulse Rate:  [46-54] 51 (03/06 0816) Resp:  [16-20] 18 (03/06 0816) BP: (128-153)/(53-79) 128/67 (03/06 0816) SpO2:  [94 %-97 %] 97 % (03/06 0816) Weight:  [97.6 kg] 97.6 kg (03/06 0527)  Weight change: 6.88 kg Filed Weights   07/02/19 1500 07/03/19 0449 07/04/19 0527  Weight: 98.7 kg 98 kg 97.6 kg    Intake/Output:    Intake/Output Summary (Last 24 hours) at 07/04/2019 1031 Last data filed at 07/04/2019 0607 Gross per 24 hour  Intake 838 ml  Output 1000 ml  Net -162 ml     Physical Exam: General:  Chronically ill-appearing, laying in the bed  HEENT  moist oral mucous membranes  Pulm/lungs  mild basilar crackles, normal effort  CVS/Heart  irregular  Abdomen:   Soft, mildly distended, obese  Extremities:  1-2+ pitting edema  Neurologic:  Alert, able to answer questions appropriately  Skin:  Warm, dry  Access:  None       Basic Metabolic Panel:  Recent Labs  Lab 07/02/19 1103 07/03/19 0755 07/04/19 0606  NA 138 142 140  K 5.2* 5.7* 4.6  CL 114* 117* 112*  CO2 17* 21* 17*  GLUCOSE 87 87 79  BUN 53* 55* 60*  CREATININE 3.49* 3.95* 4.06*  CALCIUM 9.3 9.4 9.8     CBC: Recent Labs  Lab 07/02/19 1103 07/03/19 0755  WBC 4.5 3.8*  NEUTROABS 3.4  --   HGB 8.7* 8.4*  HCT 26.1* 25.0*  MCV 105.7* 108.2*  PLT 136* 129*     No results found for: HEPBSAG, HEPBSAB, HEPBIGM    Microbiology:  Recent Results (from the past 240 hour(s))  Respiratory Panel by RT PCR (Flu A&B, Covid) -  Nasopharyngeal Swab     Status: None   Collection Time: 07/02/19 11:46 AM   Specimen: Nasopharyngeal Swab  Result Value Ref Range Status   SARS Coronavirus 2 by RT PCR NEGATIVE NEGATIVE Final    Comment: (NOTE) SARS-CoV-2 target nucleic acids are NOT DETECTED. The SARS-CoV-2 RNA is generally detectable in upper respiratoy specimens during the acute phase of infection. The lowest concentration of SARS-CoV-2 viral copies this assay can detect is 131 copies/mL. A negative result does not preclude SARS-Cov-2 infection and should not be used as the sole basis for treatment or other patient management decisions. A negative result may occur with  improper specimen collection/handling, submission of specimen other than nasopharyngeal swab, presence of viral mutation(s) within the areas targeted by this assay, and inadequate number of viral copies (<131 copies/mL). A negative result must be combined with clinical observations, patient history, and epidemiological information. The expected result is Negative. Fact Sheet for Patients:  PinkCheek.be Fact Sheet for Healthcare Providers:  GravelBags.it This test is not yet ap proved or cleared by the [place] FDA and  has been authorized for detection and/or diagnosis of SARS-CoV-2 by FDA under an Emergency Use Authorization (EUA). This EUA will remain  in effect (meaning this test can be used) for the duration of the  COVID-19 declaration under Section 564(b)(1) of the Act, 21 U.S.C. section 360bbb-3(b)(1), unless the authorization is terminated or revoked sooner.    Influenza A by PCR NEGATIVE NEGATIVE Final   Influenza B by PCR NEGATIVE NEGATIVE Final    Comment: (NOTE) The Xpert Xpress SARS-CoV-2/FLU/RSV assay is intended as an aid in  the diagnosis of influenza from Nasopharyngeal swab specimens and  should not be used as a sole basis for treatment. Nasal washings and  aspirates  are unacceptable for Xpert Xpress SARS-CoV-2/FLU/RSV  testing. Fact Sheet for Patients: PinkCheek.be Fact Sheet for Healthcare Providers: GravelBags.it This test is not yet approved or cleared by the [place] FDA and  has been authorized for detection and/or diagnosis of SARS-CoV-2 by  FDA under an Emergency Use Authorization (EUA). This EUA will remain  in effect (meaning this test can be used) for the duration of the  Covid-19 declaration under Section 564(b)(1) of the Act, 21  U.S.C. section 360bbb-3(b)(1), unless the authorization is  terminated or revoked. Performed at [facility], [address] [place], [place] [address]   Urine culture     Status: None   Collection Time: 07/02/19 11:48 AM   Specimen: Urine, Random  Result Value Ref Range Status   Specimen Description   Final    URINE, RANDOM Performed at [facility], [address], [place], [place] [address]    Special Requests   Final    NONE Performed at [facility], [address] [place], [place] [address]    Culture   Final    NO GROWTH Performed at [facility], [place] [name] [place], [place] [address]    Report Status 07/03/2019 FINAL  Final    Coagulation Studies: No results for input(s): LABPROT, INR in the last 72 hours.  Urinalysis: Recent Labs    07/02/19 1148  COLORURINE YELLOW*  LABSPEC 1.009  PHURINE 5.0  GLUCOSEU NEGATIVE  HGBUR NEGATIVE  BILIRUBINUR NEGATIVE  KETONESUR NEGATIVE  PROTEINUR NEGATIVE  NITRITE NEGATIVE  LEUKOCYTESUR MODERATE*      Imaging: DG Chest Portable 1 View  Result Date: 07/02/2019 CLINICAL DATA:  69 year old female with shortness of breath and worsening kidney function. EXAM: PORTABLE CHEST 1 VIEW COMPARISON:  Chest radiographs 11/15/2014 and earlier. FINDINGS: Portable AP semi upright view at 1110 hours. Stable cardiac size at the upper limits of normal. Other  mediastinal contours are within normal limits. Visualized tracheal air column is within normal limits. Pulmonary vascularity appears mildly increased, including from a 2016 portable chest. No pneumothorax, pleural effusion or confluent pulmonary opacity. Negative visible bowel gas pattern. No acute osseous abnormality identified. IMPRESSION: Increased pulmonary vascularity or interstitial opacity from prior studies. Consider mild or developing interstitial edema, viral/atypical respiratory infection. Electronically Signed   By: Genevie Ann M.D.   On: 07/02/2019 11:18   ECHOCARDIOGRAM COMPLETE  Result Date: 07/03/2019    ECHOCARDIOGRAM REPORT   Patient Name:   Leslie Houston Date of Exam: 07/03/2019 Medical Rec #:  [id number]        Height:       61.0 in Accession #:    [id number]       Weight:       216.0 lb Date of Birth:  12-21-1950       BSA:          1.952 m Patient Age:    83 years         BP:  146/50 mmHg Patient Gender: F                HR:           51 bpm. Exam Location:  [facility] Procedure: 2D Echo, Color Doppler and Cardiac Doppler Indications:     I50.31 CHF-Acute Diastolic  History:         Patient has no prior history of Echocardiogram examinations.                  CHF, CAD, Stroke and PVD, CKD; Risk Factors:Hypertension,                  Dyslipidemia and Diabetes.  Sonographer:     Charmayne Sheer RDCS (AE) Referring Phys:  [id number] Collier Bullock Diagnosing Phys: Harrell Gave End MD  Sonographer Comments: Suboptimal parasternal window and suboptimal subcostal window. IMPRESSIONS  1. Left ventricular ejection fraction, by estimation, is 50 to 55%. The left ventricle has low normal function. The left ventricle has no regional wall motion abnormalities. Left ventricular diastolic parameters are consistent with Grade II diastolic dysfunction (pseudonormalization).  2. Right ventricular systolic function is normal. The right ventricular size is normal. Tricuspid regurgitation signal is inadequate for  assessing PA pressure.  3. Left atrial size was mildly dilated.  4. The mitral valve is degenerative. Mild mitral valve regurgitation. No evidence of mitral stenosis.  5. The aortic valve is tricuspid. Aortic valve regurgitation is not visualized. No aortic stenosis is present.  6. The inferior vena cava is normal in size with <50% respiratory variability, suggesting right atrial pressure of 8 mmHg. FINDINGS  Left Ventricle: Left ventricular ejection fraction, by estimation, is 50 to 55%. The left ventricle has low normal function. The left ventricle has no regional wall motion abnormalities. The left ventricular internal cavity size was normal in size. There is no left ventricular hypertrophy. Left ventricular diastolic parameters are consistent with Grade II diastolic dysfunction (pseudonormalization). Right Ventricle: The right ventricular size is normal. No increase in right ventricular wall thickness. Right ventricular systolic function is normal. Tricuspid regurgitation signal is inadequate for assessing PA pressure. Left Atrium: Left atrial size was mildly dilated. Right Atrium: Right atrial size was normal in size. Pericardium: There is no evidence of pericardial effusion. Mitral Valve: The mitral valve is degenerative in appearance. There is mild thickening of the mitral valve leaflet(s). Mild mitral annular calcification. Mild mitral valve regurgitation. No evidence of mitral valve stenosis. MV peak gradient, 5.0 mmHg. The mean mitral valve gradient is 3.0 mmHg. Tricuspid Valve: The tricuspid valve is not well visualized. Tricuspid valve regurgitation is mild. Aortic Valve: The aortic valve is tricuspid. Aortic valve regurgitation is not visualized. No aortic stenosis is present. There is mild calcification of the aortic valve. Aortic valve mean gradient measures 5.0 mmHg. Aortic valve peak gradient measures 12.7 mmHg. Aortic valve area, by VTI measures 2.05 cm. Pulmonic Valve: The pulmonic valve was not  well visualized. Pulmonic valve regurgitation is not visualized. No evidence of pulmonic stenosis. Aorta: The aortic root is normal in size and structure. Pulmonary Artery: The pulmonary artery is not well seen. Venous: The inferior vena cava is normal in size with less than 50% respiratory variability, suggesting right atrial pressure of 8 mmHg. IAS/Shunts: The interatrial septum was not well visualized.  LEFT VENTRICLE PLAX 2D LVIDd:         4.67 cm     Diastology LVIDs:         3.69 cm  LV e' lateral:   8.49 cm/s LV PW:         1.00 cm     LV E/e' lateral: 11.3 LV IVS:        0.64 cm     LV e' medial:    7.94 cm/s LVOT diam:     1.90 cm     LV E/e' medial:  12.0 LV SV:         86 LV SV Index:   44 LVOT Area:     2.84 cm  LV Volumes (MOD) LV vol d, MOD A2C: 73.1 ml LV vol d, MOD A4C: 89.0 ml LV vol s, MOD A2C: 36.4 ml LV vol s, MOD A4C: 44.7 ml LV SV MOD A2C:     36.7 ml LV SV MOD A4C:     89.0 ml LV SV MOD BP:      44.5 ml RIGHT VENTRICLE RV Basal diam:  3.31 cm LEFT ATRIUM             Index       RIGHT ATRIUM           Index LA diam:        3.90 cm 2.00 cm/m  RA Area:     14.70 cm LA Vol (A2C):   47.9 ml 24.54 ml/m RA Volume:   35.30 ml  18.09 ml/m LA Vol (A4C):   79.1 ml 40.53 ml/m LA Biplane Vol: 68.5 ml 35.10 ml/m  AORTIC VALVE                    PULMONIC VALVE AV Area (Vmax):    2.28 cm     PV Vmax:       1.03 m/s AV Area (Vmean):   2.05 cm     PV Vmean:      71.600 cm/s AV Area (VTI):     2.05 cm     PV VTI:        0.236 m AV Vmax:           178.00 cm/s  PV Peak grad:  4.2 mmHg AV Vmean:          109.000 cm/s PV Mean grad:  2.0 mmHg AV VTI:            0.421 m AV Peak Grad:      12.7 mmHg AV Mean Grad:      5.0 mmHg LVOT Vmax:         143.00 cm/s LVOT Vmean:        79.000 cm/s LVOT VTI:          0.305 m LVOT/AV VTI ratio: 0.72  AORTA Ao Root diam: 3.00 cm MITRAL VALVE MV Area (PHT): 2.60 cm    SHUNTS MV Peak grad:  5.0 mmHg    Systemic VTI:  0.30 m MV Mean grad:  3.0 mmHg    Systemic Diam: 1.90  cm MV Vmax:       1.12 m/s MV Vmean:      76.0 cm/s MV Decel Time: 292 msec MV E velocity: 95.55 cm/s MV A velocity: 82.00 cm/s MV E/A ratio:  1.17 Harrell Gave End MD Electronically signed by Nelva Bush MD Signature Date/Time: 07/03/2019/1:52:19 PM    Final      Medications:    . aspirin EC  81 mg Oral Daily  . atorvastatin  10 mg Oral q1800  . citalopram  20 mg Oral Daily  . epoetin (EPOGEN/PROCRIT) injection  10,000  Units Subcutaneous Weekly  . furosemide  40 mg Intravenous Daily  . levETIRAcetam  500 mg Oral BID  . patiromer  16.8 g Oral Daily  . sodium chloride flush  10 mL Intravenous Q12H   acetaminophen **OR** acetaminophen, diclofenac sodium, fluticasone, ondansetron **OR** ondansetron (ZOFRAN) IV, senna-docusate  Assessment/ Plan:  69 y.o. female with HTN, Carotid stenosis, previous h/o hydronephrosis, seizure, stroke, anxiety, CKD admitted with following issues  Principal Problem:   AKI (acute kidney injury) ([place]) Active Problems:   Essential hypertension   GERD   Seizures ([place])   Anemia of chronic disease   Bradycardia   Hyperkalemia   #. 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  eGFR down to 12.  Check renal ultrasound to make sure no underlying obstruction now.   #. Anemia of CKD  Lab Results  Component Value Date   HGB 8.4 (L) 07/03/2019  Maintain the patient on Epogen.  #. HTN with lower extremity edema Agree with IV diuresis Cardiology evaluation is in progress.  Results of 2D echo are pending  #Hyperkalemia Continue veltassa.     LOS: 2 Irene Collings 3/6/202110:31 AM  [facility] [place], [place]

## 2019-07-04 NOTE — Consult Note (Signed)
Urology Consult  Requesting physician: Jennye Boroughs, MD  Reason for consultation: Bilateral hydronephrosis, urinary retention, acute kidney injury  Chief Complaint: N/A  History of Present Illness: Leslie Houston is a 69 y.o. female admitted with acute kidney injury on chronic kidney disease.  She has a history of acute kidney injury secondary to urinary retention in 2015 with creatinine of 5.92 and CT showing bilateral hydronephrosis and a distended bladder.  Her creatinine returned to the 1.3 range after Foley catheter drainage.  She was seen by Dr. Erlene Quan at that visit and never followed up with urology.  A CT performed in 2016 showed bilateral hydronephrosis and an estimated bladder volume of 558 mL.  She was admitted with shortness of breath and worsening kidney function.  Her creatinine on admission was 3.49 and was 4.06 this morning.  Renal ultrasound performed late this morning showed a distended bladder with an estimated volume of 650 mL.  There was marked bilateral hydronephrosis and diffuse bilateral renal cortical thinning with mild increased echogenicity.  Attempts at placement of a Foley catheter by floor nursing staff were unsuccessful and urology consultation was requested.   Past Medical History:  Diagnosis Date  . CAD (coronary artery disease)    a. Pt reports in 2000 she had a stent to a coronary artery, details unclear, outside hospital.  . Carotid stenosis    a. CT angio head 07/2014 - 50-60% stenoses of prox ICA bilaterally.  . CHF (congestive heart failure) ([place])    a. Pt reports in 2000 she had CHF, details unclear, outside hospital.  . CKD (chronic kidney disease), stage III   . Diabetes mellitus ([place])   . Family hx of colon cancer 06/13/2018   Father diagnosed in his 24's  . Gout   . Hyperlipidemia   . Hypertension   . Orthostatic hypotension   . Osteopenia 01/24/2018   DEXA Sept 2019  . PUD (peptic ulcer disease)   . PVC's (premature ventricular  contractions)   . Seizures ([place])   . Sinus bradycardia   . Stroke [facility])    a. L PCA CVA in 05/2014.  [name] Syncope     Past Surgical History:  Procedure Laterality Date  . CESAREAN SECTION    . DENTAL SURGERY    . Heart stent    . stint      Home Medications:  Current Meds  Medication Sig  . amLODipine (NORVASC) 5 MG tablet Take 5 mg by mouth daily.  [name] aspirin (ASPIR-LOW) 81 MG EC tablet Take 1 tablet (81 mg total) by mouth daily. Swallow whole.  . citalopram (CELEXA) 20 MG tablet Take 1 tablet once daily (Patient taking differently: Take 20 mg by mouth daily. Take 1 tablet once daily)  . ezetimibe (ZETIA) 10 MG tablet Take 10 mg by mouth daily.  [name] levETIRAcetam (KEPPRA) 500 MG tablet One in the morning, 2 tabs at night (Patient taking differently: Take 500-750 mg by mouth 2 (two) times daily. Take one tablet (500 mg) in the morning, one and one-half tablets (750 mg)  at night)  . olmesartan-hydrochlorothiazide (BENICAR HCT) 40-12.5 MG tablet TAKE 1 TABLET BY MOUTH ONCE DAILY    Allergies:  Allergies  Allergen Reactions  . Penicillin G Itching  . Atorvastatin Other (See Comments)    Myalgias   . Other Hives    Adhesive tape    Family History  Problem Relation Age of Onset  . Stroke Mother   . Heart attack Mother   .  Lung cancer Mother   . Stroke Father   . Prostate cancer Father   . Colon cancer Father   . Diabetes Mellitus II Daughter   . Multiple sclerosis Daughter   . Kidney disease Son   . Heart failure Son   . Cancer Paternal Grandfather     Social History:  reports that she quit smoking about 5 years ago. She has never used smokeless tobacco. She reports that she does not drink alcohol or use drugs.  ROS: A complete review of systems was performed.  All systems are negative except for pertinent findings as noted.   Physical Exam:  Vital signs in last 24 hours: Temp:  [97.5 F (36.4 C)-98.1 F (36.7 C)] 97.5 F (36.4 C) (03/06 1950) Pulse Rate:  [50-61]  61 (03/06 1950) Resp:  [16-20] 20 (03/06 1950) BP: (128-155)/(59-79) 155/69 (03/06 1950) SpO2:  [96 %-98 %] 97 % (03/06 1950) Weight:  [97.6 kg] 97.6 kg (03/06 0527) Constitutional:  Alert and oriented, No acute distress HEENT: [place] AT, moist mucus membranes.  Trachea midline, no masses en is soft, nontender, nondistended, no abdominal masses GU: Atrophic external genitalia   Laboratory Data:  Recent Labs    07/02/19 1103 07/03/19 0755  WBC 4.5 3.8*  HGB 8.7* 8.4*  HCT 26.1* 25.0*   Recent Labs    07/02/19 1103 07/03/19 0755 07/04/19 0606  NA 138 142 140  K 5.2* 5.7* 4.6  CL 114* 117* 112*  CO2 17* 21* 17*  GLUCOSE 87 87 79  BUN 53* 55* 60*  CREATININE 3.49* 3.95* 4.06*  CALCIUM 9.3 9.4 9.8   No results for input(s): LABPT, INR in the last 72 hours. No results for input(s): LABURIN in the last 72 hours. Results for orders placed or performed during the hospital encounter of 07/02/19  Respiratory Panel by RT PCR (Flu A&B, Covid) - Nasopharyngeal Swab     Status: None   Collection Time: 07/02/19 11:46 AM   Specimen: Nasopharyngeal Swab  Result Value Ref Range Status   SARS Coronavirus 2 by RT PCR NEGATIVE NEGATIVE Final    Comment: (NOTE) SARS-CoV-2 target nucleic acids are NOT DETECTED. The SARS-CoV-2 RNA is generally detectable in upper respiratoy specimens during the acute phase of infection. The lowest concentration of SARS-CoV-2 viral copies this assay can detect is 131 copies/mL. A negative result does not preclude SARS-Cov-2 infection and should not be used as the sole basis for treatment or other patient management decisions. A negative result may occur with  improper specimen collection/handling, submission of specimen other than nasopharyngeal swab, presence of viral mutation(s) within the areas targeted by this assay, and inadequate number of viral copies (<131 copies/mL). A negative result must be combined with clinical observations, patient history, and  epidemiological information. The expected result is Negative. Fact Sheet for Patients:  PinkCheek.be Fact Sheet for Healthcare Providers:  GravelBags.it This test is not yet ap proved or cleared by the [place] FDA and  has been authorized for detection and/or diagnosis of SARS-CoV-2 by FDA under an Emergency Use Authorization (EUA). This EUA will remain  in effect (meaning this test can be used) for the duration of the COVID-19 declaration under Section 564(b)(1) of the Act, 21 U.S.C. section 360bbb-3(b)(1), unless the authorization is terminated or revoked sooner.    Influenza A by PCR NEGATIVE NEGATIVE Final   Influenza B by PCR NEGATIVE NEGATIVE Final    Comment: (NOTE) The Xpert Xpress SARS-CoV-2/FLU/RSV assay is intended as an aid in  the diagnosis of influenza from Nasopharyngeal swab specimens and  should not be used as a sole basis for treatment. Nasal washings and  aspirates are unacceptable for Xpert Xpress SARS-CoV-2/FLU/RSV  testing. Fact Sheet for Patients: PinkCheek.be Fact Sheet for Healthcare Providers: GravelBags.it This test is not yet approved or cleared by the [place] FDA and  has been authorized for detection and/or diagnosis of SARS-CoV-2 by  FDA under an Emergency Use Authorization (EUA). This EUA will remain  in effect (meaning this test can be used) for the duration of the  Covid-19 declaration under Section 564(b)(1) of the Act, 21  U.S.C. section 360bbb-3(b)(1), unless the authorization is  terminated or revoked. Performed at [facility], [address] [place], [place] [address]   Urine culture     Status: None   Collection Time: 07/02/19 11:48 AM   Specimen: Urine, Random  Result Value Ref Range Status   Specimen Description   Final    URINE, RANDOM Performed at [facility], [address]  [place], [place] [address]    Special Requests   Final    NONE Performed at [facility], [address], [place], [place] [address]    Culture   Final    NO GROWTH Performed at [facility], [place] [address], [place], [place] [address]    Report Status 07/03/2019 FINAL  Final     Radiologic Imaging: US RENAL  Result Date: 07/04/2019 CLINICAL DATA:  Acute kidney failure. EXAM: RENAL / URINARY TRACT ULTRASOUND COMPLETE COMPARISON:  Abdomen and pelvis CT dated 08/18/2014 FINDINGS: Right Kidney: Renal measurements: 11.4 x 7.8 x 6.8 cm = volume: 321 mL. Mildly echogenic with diffuse cortical thinning. Marked dilatation of the renal collecting system. Left Kidney: Renal measurements: 11.2 x 6.9 x 5.4 cm = volume: 218 mL. Mildly echogenic with diffuse cortical thinning. Marked dilatation of the renal collecting system. Bladder: Dilated bladder with no visible mass. The calculated bladder volume is 1650 cc. Other: None. IMPRESSION: 1. Chronically dilated bladder with marked bilateral hydronephrosis. 2. Diffuse bilateral renal cortical thinning and mildly increased echogenicity, most likely due to chronic bilateral hydronephrosis and mild medical renal disease. Electronically Signed   By: Claudie Revering M.D.   On: 07/04/2019 11:24   ECHOCARDIOGRAM COMPLETE  Result Date: 07/03/2019    ECHOCARDIOGRAM REPORT   Patient Name:   ORETA SOLOWAY Date of Exam: 07/03/2019 Medical Rec #:  [id number]        Height:       61.0 in Accession #:    [id number]       Weight:       216.0 lb Date of Birth:  November 25, 1950       BSA:          1.952 m Patient Age:    64 years         BP:           146/50 mmHg Patient Gender: F                HR:           51 bpm. Exam Location:  [facility] Procedure: 2D Echo, Color Doppler and Cardiac Doppler Indications:     I50.31 CHF-Acute Diastolic  History:         Patient has no prior history of Echocardiogram examinations.                  CHF, CAD, Stroke and PVD, CKD; Risk  Factors:Hypertension,                  Dyslipidemia and Diabetes.  Sonographer:     Charmayne Sheer RDCS (AE) Referring Phys:  [id number] Collier Bullock Diagnosing Phys: Harrell Gave End MD  Sonographer Comments: Suboptimal parasternal window and suboptimal subcostal window. IMPRESSIONS  1. Left ventricular ejection fraction, by estimation, is 50 to 55%. The left ventricle has low normal function. The left ventricle has no regional wall motion abnormalities. Left ventricular diastolic parameters are consistent with Grade II diastolic dysfunction (pseudonormalization).  2. Right ventricular systolic function is normal. The right ventricular size is normal. Tricuspid regurgitation signal is inadequate for assessing PA pressure.  3. Left atrial size was mildly dilated.  4. The mitral valve is degenerative. Mild mitral valve regurgitation. No evidence of mitral stenosis.  5. The aortic valve is tricuspid. Aortic valve regurgitation is not visualized. No aortic stenosis is present.  6. The inferior vena cava is normal in size with <50% respiratory variability, suggesting right atrial pressure of 8 mmHg. FINDINGS  Left Ventricle: Left ventricular ejection fraction, by estimation, is 50 to 55%. The left ventricle has low normal function. The left ventricle has no regional wall motion abnormalities. The left ventricular internal cavity size was normal in size. There is no left ventricular hypertrophy. Left ventricular diastolic parameters are consistent with Grade II diastolic dysfunction (pseudonormalization). Right Ventricle: The right ventricular size is normal. No increase in right ventricular wall thickness. Right ventricular systolic function is normal. Tricuspid regurgitation signal is inadequate for assessing PA pressure. Left Atrium: Left atrial size was mildly dilated. Right Atrium: Right atrial size was normal in size. Pericardium: There is no evidence of pericardial effusion. Mitral Valve: The mitral valve is  degenerative in appearance. There is mild thickening of the mitral valve leaflet(s). Mild mitral annular calcification. Mild mitral valve regurgitation. No evidence of mitral valve stenosis. MV peak gradient, 5.0 mmHg. The mean mitral valve gradient is 3.0 mmHg. Tricuspid Valve: The tricuspid valve is not well visualized. Tricuspid valve regurgitation is mild. Aortic Valve: The aortic valve is tricuspid. Aortic valve regurgitation is not visualized. No aortic stenosis is present. There is mild calcification of the aortic valve. Aortic valve mean gradient measures 5.0 mmHg. Aortic valve peak gradient measures 12.7 mmHg. Aortic valve area, by VTI measures 2.05 cm. Pulmonic Valve: The pulmonic valve was not well visualized. Pulmonic valve regurgitation is not visualized. No evidence of pulmonic stenosis. Aorta: The aortic root is normal in size and structure. Pulmonary Artery: The pulmonary artery is not well seen. Venous: The inferior vena cava is normal in size with less than 50% respiratory variability, suggesting right atrial pressure of 8 mmHg. IAS/Shunts: The interatrial septum was not well visualized.  LEFT VENTRICLE PLAX 2D LVIDd:         4.67 cm     Diastology LVIDs:         3.69 cm     LV e' lateral:   8.49 cm/s LV PW:         1.00 cm     LV E/e' lateral: 11.3 LV IVS:        0.64 cm     LV e' medial:    7.94 cm/s LVOT diam:     1.90 cm     LV E/e' medial:  12.0 LV SV:         86 LV SV Index:   44 LVOT Area:     2.84 cm  LV Volumes (  MOD) LV vol d, MOD A2C: 73.1 ml LV vol d, MOD A4C: 89.0 ml LV vol s, MOD A2C: 36.4 ml LV vol s, MOD A4C: 44.7 ml LV SV MOD A2C:     36.7 ml LV SV MOD A4C:     89.0 ml LV SV MOD BP:      44.5 ml RIGHT VENTRICLE RV Basal diam:  3.31 cm LEFT ATRIUM             Index       RIGHT ATRIUM           Index LA diam:        3.90 cm 2.00 cm/m  RA Area:     14.70 cm LA Vol (A2C):   47.9 ml 24.54 ml/m RA Volume:   35.30 ml  18.09 ml/m LA Vol (A4C):   79.1 ml 40.53 ml/m LA Biplane Vol:  68.5 ml 35.10 ml/m  AORTIC VALVE                    PULMONIC VALVE AV Area (Vmax):    2.28 cm     PV Vmax:       1.03 m/s AV Area (Vmean):   2.05 cm     PV Vmean:      71.600 cm/s AV Area (VTI):     2.05 cm     PV VTI:        0.236 m AV Vmax:           178.00 cm/s  PV Peak grad:  4.2 mmHg AV Vmean:          109.000 cm/s PV Mean grad:  2.0 mmHg AV VTI:            0.421 m AV Peak Grad:      12.7 mmHg AV Mean Grad:      5.0 mmHg LVOT Vmax:         143.00 cm/s LVOT Vmean:        79.000 cm/s LVOT VTI:          0.305 m LVOT/AV VTI ratio: 0.72  AORTA Ao Root diam: 3.00 cm MITRAL VALVE MV Area (PHT): 2.60 cm    SHUNTS MV Peak grad:  5.0 mmHg    Systemic VTI:  0.30 m MV Mean grad:  3.0 mmHg    Systemic Diam: 1.90 cm MV Vmax:       1.12 m/s MV Vmean:      76.0 cm/s MV Decel Time: 292 msec MV E velocity: 95.55 cm/s MV A velocity: 82.00 cm/s MV E/A ratio:  1.17 Harrell Gave End MD Electronically signed by Nelva Bush MD Signature Date/Time: 07/03/2019/1:52:19 PM    Final    The patient was placed in the frog-leg position and external genitalia were prepped and draped.  With assistance on labial retraction the meatus was visualized and a 16 French Foley catheter was placed without difficulty with return of clear urine.  Impression:  69 y.o. female with high-volume urinary retention, bilateral hydronephrosis and acute kidney injury on CKD.  Foley catheter placed successfully  Recommendation:  -Based on the chronicity would not expect her hydronephrosis to resolve however hopefully her kidney function will improve.  -Would leave Foley catheter indwelling for 10-14 days  -After catheter removal close follow-up stressed for monitoring of bladder volume/residuals   07/04/2019, 9:03 PM  John Giovanni,  MD

## 2019-07-04 NOTE — Progress Notes (Addendum)
Progress Note    Leslie Houston  TDD:[id number] DOB: Dec 25, 1950  DOA: 07/02/2019 PCP: Hubbard Hartshorn, FNP      Brief Narrative:    Medical records reviewed and are as summarized below:  Leslie Houston is an 69 y.o. 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  Patient has had exertional shortness of breath and worsening lower extremity swelling and increased abdominal girth for about 2 months.    She had been on multiple oral diuretic therapy without any significant improvement in her swelling. She is also noted to have worsening of her renal function from her baseline and her creatinine has gone from 1.3 in 2019  >> 3.49 with a GFR of 15.39ml/min.  She had 2 pillow orthopnea but did not have any chest  pain, nausea, vomiting, diaphoresis, palpitations or any changes in her bowel habits. Patient also has a 3 g drop in her H&H from 11.8g/dl to 8.7g/dl and potassium is slightly elevated at 5.3      Assessment/Plan:   Principal Problem:   AKI (acute kidney injury) ([place]) Active Problems:   Essential hypertension   GERD   Seizures (HCC)   Anemia of chronic disease   Bradycardia   Hyperkalemia   Sinus bradycardia with sinus pauses: Asymptomatic.  Patient was noted to have a 5-second pause and 7-second pause on telemetry that occurred between 5 PM and 7 AM on 07/03/2019. 2D echo showed EF estimated at 50 to 62%, grade 2 diastolic dysfunction. Patient has been evaluated by cardiologist/electrophysiologist and pacemaker is not recommended at this time.  AKI on CKD stage III versus progression to stage V CKD/hyperkalemia/metabolic acidosis/peripheral edema: Creatinine is getting worse but potassium is better.  Renal ultrasound showed  marked bilateral hydronephrosis.  Patient also has acute urinary retention but Foley catheter could not be placed.  Consulted urologist, Dr. Bernardo Heater, for further evaluation.  Continue Veltassa  Acute on chronic diastolic CHF/peripheral edema: Continue IV Lasix.  2D echo showed EF estimated at 50 to 55% and grade 2 diastolic dysfunction.  Acute on chronic anemia/anemia of chronic kidney disease: Monitor H&H.  Seizure disorder: Continue Keppra.  Hypertension: Monitor BP closely.  Patient is on Lasix.  Body mass index is 40.66 kg/m.  (Morbid obesity).  Weight loss advised.  Outpatient sleep study recommended to exclude obstructive sleep apnea   Family Communication/Anticipated D/C date and plan/Code Status   DVT prophylaxis:  Code Status: Full code Family Communication: Plan discussed with patient and Leslie Houston,Leslie Houston, at the bedside Disposition Plan: Patient is from home with plan to discharge home.  She will be discharged home when cleared by cardiologist and nephrologist.     Subjective:   No complaints.  No shortness of breath, chest pain or palpitations.  Leg swelling is a little better.  Objective:    Vitals:   07/03/19 1952 07/04/19 0527 07/04/19 0816 07/04/19 1159  BP: (!) 147/66 (!) 151/79 128/67 (!) 150/66  Pulse: (!) 46 (!) 50 (!) 51 (!) 51  Resp: 20 16 18 18   Temp: (!) 97.4 F (36.3 C) 98.1 F (36.7 C) 97.7 F (36.5 C)   TempSrc: Oral Oral    SpO2: 96% 96% 97% 98%  Weight:  97.6 kg    Height:        Intake/Output Summary (  Last 24 hours) at 07/04/2019 1447 Last data filed at 07/04/2019 1136 Gross per 24 hour  Intake 838 ml  Output 1050 ml  Net -212 ml   Filed Weights   07/02/19 1500 07/03/19 0449 07/04/19 0527  Weight: 98.7 kg 98 kg 97.6 kg    Exam:  GEN: No acute distress SKIN: No rash EYES: EOMI ENT: MMM CV: RRR PULM: No wheezing or rales head ABD: soft, obese, NT, +BS CNS: AAO x 3, non focal EXT: Bilateral leg edema (1+), no  tenderness   Data Reviewed:   I have personally reviewed following labs and imaging studies:  Labs: Labs show the following:   Basic Metabolic Panel: Recent Labs  Lab 07/02/19 1103 07/02/19 1103 07/03/19 0755 07/04/19 0606  NA 138  --  142 140  K 5.2*   < > 5.7* 4.6  CL 114*  --  117* 112*  CO2 17*  --  21* 17*  GLUCOSE 87  --  87 79  BUN 53*  --  55* 60*  CREATININE 3.49*  --  3.95* 4.06*  CALCIUM 9.3  --  9.4 9.8   < > = values in this interval not displayed.   GFR Estimated Creatinine Clearance: 13.8 mL/min (A) (by C-G formula based on SCr of 4.06 mg/dL (H)). Liver Function Tests: Recent Labs  Lab 07/02/19 1103  AST 23  ALT 26  ALKPHOS 100  BILITOT 0.6  PROT 7.4  ALBUMIN 4.1   No results for input(s): LIPASE, AMYLASE in the last 168 hours. No results for input(s): AMMONIA in the last 168 hours. Coagulation profile No results for input(s): INR, PROTIME in the last 168 hours.  CBC: Recent Labs  Lab 07/02/19 1103 07/03/19 0755  WBC 4.5 3.8*  NEUTROABS 3.4  --   HGB 8.7* 8.4*  HCT 26.1* 25.0*  MCV 105.7* 108.2*  PLT 136* 129*   Cardiac Enzymes: No results for input(s): CKTOTAL, CKMB, CKMBINDEX, TROPONINI in the last 168 hours. BNP (last 3 results) No results for input(s): PROBNP in the last 8760 hours. CBG: No results for input(s): GLUCAP in the last 168 hours. D-Dimer: No results for input(s): DDIMER in the last 72 hours. Hgb A1c: No results for input(s): HGBA1C in the last 72 hours. Lipid Profile: No results for input(s): CHOL, HDL, LDLCALC, TRIG, CHOLHDL, LDLDIRECT in the last 72 hours. Thyroid function studies: Recent Labs    07/02/19 1103  TSH 2.892   Anemia work up: Recent Labs    07/02/19 1525  TIBC 302  IRON 62   Sepsis Labs: Recent Labs  Lab 07/02/19 1103 07/03/19 0755  WBC 4.5 3.8*    Microbiology Recent Results (from the past 240 hour(s))  Respiratory Panel by RT PCR (Flu A&B, Covid) - Nasopharyngeal Swab      Status: None   Collection Time: 07/02/19 11:46 AM   Specimen: Nasopharyngeal Swab  Result Value Ref Range Status   SARS Coronavirus 2 by RT PCR NEGATIVE NEGATIVE Final    Comment: (NOTE) SARS-CoV-2 target nucleic acids are NOT DETECTED. The SARS-CoV-2 RNA is generally detectable in upper respiratoy specimens during the acute phase of infection. The lowest concentration of SARS-CoV-2 viral copies this assay can detect is 131 copies/mL. A negative result does not preclude SARS-Cov-2 infection and should not be used as the sole basis for treatment or other patient management decisions. A negative result may occur with  improper specimen collection/handling, submission of specimen other than nasopharyngeal swab, presence of viral mutation(s) within the  areas targeted by this assay, and inadequate number of viral copies (<131 copies/mL). A negative result must be combined with clinical observations, patient history, and epidemiological information. The expected result is Negative. Fact Sheet for Patients:  PinkCheek.be Fact Sheet for Healthcare Providers:  GravelBags.it This test is not yet ap proved or cleared by the [place] FDA and  has been authorized for detection and/or diagnosis of SARS-CoV-2 by FDA under an Emergency Use Authorization (EUA). This EUA will remain  in effect (meaning this test can be used) for the duration of the COVID-19 declaration under Section 564(b)(1) of the Act, 21 U.S.C. section 360bbb-3(b)(1), unless the authorization is terminated or revoked sooner.    Influenza A by PCR NEGATIVE NEGATIVE Final   Influenza B by PCR NEGATIVE NEGATIVE Final    Comment: (NOTE) The Xpert Xpress SARS-CoV-2/FLU/RSV assay is intended as an aid in  the diagnosis of influenza from Nasopharyngeal swab specimens and  should not be used as a sole basis for treatment. Nasal washings and  aspirates are unacceptable for  Xpert Xpress SARS-CoV-2/FLU/RSV  testing. Fact Sheet for Patients: PinkCheek.be Fact Sheet for Healthcare Providers: GravelBags.it This test is not yet approved or cleared by the [place] FDA and  has been authorized for detection and/or diagnosis of SARS-CoV-2 by  FDA under an Emergency Use Authorization (EUA). This EUA will remain  in effect (meaning this test can be used) for the duration of the  Covid-19 declaration under Section 564(b)(1) of the Act, 21  U.S.C. section 360bbb-3(b)(1), unless the authorization is  terminated or revoked. Performed at [facility], [address], [place], [place] [address]   Urine culture     Status: None   Collection Time: 07/02/19 11:48 AM   Specimen: Urine, Random  Result Value Ref Range Status   Specimen Description   Final    URINE, RANDOM Performed at [facility], [address] [place], [place] [address]    Special Requests   Final    NONE Performed at [facility], [address], [place], [place] [address]    Culture   Final    NO GROWTH Performed at [facility], [place] [address], [place], [place] [address]    Report Status 07/03/2019 FINAL  Final    Procedures and diagnostic studies:  US RENAL  Result Date: 07/05/2019 CLINICAL DATA:  Acute kidney failure. EXAM: RENAL / URINARY TRACT ULTRASOUND COMPLETE COMPARISON:  Abdomen and pelvis CT dated 08/18/2014 FINDINGS: Right Kidney: Renal measurements: 11.4 x 7.8 x 6.8 cm = volume: 321 mL. Mildly echogenic with diffuse cortical thinning. Marked dilatation of the renal collecting system. Left Kidney: Renal measurements: 11.2 x 6.9 x 5.4 cm = volume: 218 mL. Mildly echogenic with diffuse cortical thinning. Marked dilatation of the renal collecting system. Bladder: Dilated bladder with no visible mass. The calculated bladder volume is 1650 cc. Other: None. IMPRESSION: 1. Chronically dilated  bladder with marked bilateral hydronephrosis. 2. Diffuse bilateral renal cortical thinning and mildly increased echogenicity, most likely due to chronic bilateral hydronephrosis and mild medical renal disease. Electronically Signed   By: Claudie Revering M.D.   On: Jul 05, 2019 11:24   ECHOCARDIOGRAM COMPLETE  Result Date: 07/03/2019    ECHOCARDIOGRAM REPORT   Patient Name:   BRENDALEE MATTHIES Date of Exam: 07/03/2019 Medical Rec #:  [id number]        Height:       61.0 in Accession #:    [id number]  Weight:       216.0 lb Date of Birth:  [birth year]       BSA:          1.952 m Patient Age:    40 years         BP:           146/50 mmHg Patient Gender: F                HR:           51 bpm. Exam Location:  [facility] Procedure: 2D Echo, Color Doppler and Cardiac Doppler Indications:     I50.31 CHF-Acute Diastolic  History:         Patient has no prior history of Echocardiogram examinations.                  CHF, CAD, Stroke and PVD, CKD; Risk Factors:Hypertension,                  Dyslipidemia and Diabetes.  Sonographer:     Charmayne Sheer RDCS (AE) Referring Phys:  [id number] Collier Bullock Diagnosing Phys: Harrell Gave End MD  Sonographer Comments: Suboptimal parasternal window and suboptimal subcostal window. IMPRESSIONS  1. Left ventricular ejection fraction, by estimation, is 50 to 55%. The left ventricle has low normal function. The left ventricle has no regional wall motion abnormalities. Left ventricular diastolic parameters are consistent with Grade II diastolic dysfunction (pseudonormalization).  2. Right ventricular systolic function is normal. The right ventricular size is normal. Tricuspid regurgitation signal is inadequate for assessing PA pressure.  3. Left atrial size was mildly dilated.  4. The mitral valve is degenerative. Mild mitral valve regurgitation. No evidence of mitral stenosis.  5. The aortic valve is tricuspid. Aortic valve regurgitation is not visualized. No aortic stenosis is present.  6. The inferior  vena cava is normal in size with <50% respiratory variability, suggesting right atrial pressure of 8 mmHg. FINDINGS  Left Ventricle: Left ventricular ejection fraction, by estimation, is 50 to 55%. The left ventricle has low normal function. The left ventricle has no regional wall motion abnormalities. The left ventricular internal cavity size was normal in size. There is no left ventricular hypertrophy. Left ventricular diastolic parameters are consistent with Grade II diastolic dysfunction (pseudonormalization). Right Ventricle: The right ventricular size is normal. No increase in right ventricular wall thickness. Right ventricular systolic function is normal. Tricuspid regurgitation signal is inadequate for assessing PA pressure. Left Atrium: Left atrial size was mildly dilated. Right Atrium: Right atrial size was normal in size. Pericardium: There is no evidence of pericardial effusion. Mitral Valve: The mitral valve is degenerative in appearance. There is mild thickening of the mitral valve leaflet(s). Mild mitral annular calcification. Mild mitral valve regurgitation. No evidence of mitral valve stenosis. MV peak gradient, 5.0 mmHg. The mean mitral valve gradient is 3.0 mmHg. Tricuspid Valve: The tricuspid valve is not well visualized. Tricuspid valve regurgitation is mild. Aortic Valve: The aortic valve is tricuspid. Aortic valve regurgitation is not visualized. No aortic stenosis is present. There is mild calcification of the aortic valve. Aortic valve mean gradient measures 5.0 mmHg. Aortic valve peak gradient measures 12.7 mmHg. Aortic valve area, by VTI measures 2.05 cm. Pulmonic Valve: The pulmonic valve was not well visualized. Pulmonic valve regurgitation is not visualized. No evidence of pulmonic stenosis. Aorta: The aortic root is normal in size and structure. Pulmonary Artery: The pulmonary artery is not well seen. Venous: The inferior vena cava is normal in  size with less than 50% respiratory  variability, suggesting right atrial pressure of 8 mmHg. IAS/Shunts: The interatrial septum was not well visualized.  LEFT VENTRICLE PLAX 2D LVIDd:         4.67 cm     Diastology LVIDs:         3.69 cm     LV e' lateral:   8.49 cm/s LV PW:         1.00 cm     LV E/e' lateral: 11.3 LV IVS:        0.64 cm     LV e' medial:    7.94 cm/s LVOT diam:     1.90 cm     LV E/e' medial:  12.0 LV SV:         86 LV SV Index:   44 LVOT Area:     2.84 cm  LV Volumes (MOD) LV vol d, MOD A2C: 73.1 ml LV vol d, MOD A4C: 89.0 ml LV vol s, MOD A2C: 36.4 ml LV vol s, MOD A4C: 44.7 ml LV SV MOD A2C:     36.7 ml LV SV MOD A4C:     89.0 ml LV SV MOD BP:      44.5 ml RIGHT VENTRICLE RV Basal diam:  3.31 cm LEFT ATRIUM             Index       RIGHT ATRIUM           Index LA diam:        3.90 cm 2.00 cm/m  RA Area:     14.70 cm LA Vol (A2C):   47.9 ml 24.54 ml/m RA Volume:   35.30 ml  18.09 ml/m LA Vol (A4C):   79.1 ml 40.53 ml/m LA Biplane Vol: 68.5 ml 35.10 ml/m  AORTIC VALVE                    PULMONIC VALVE AV Area (Vmax):    2.28 cm     PV Vmax:       1.03 m/s AV Area (Vmean):   2.05 cm     PV Vmean:      71.600 cm/s AV Area (VTI):     2.05 cm     PV VTI:        0.236 m AV Vmax:           178.00 cm/s  PV Peak grad:  4.2 mmHg AV Vmean:          109.000 cm/s PV Mean grad:  2.0 mmHg AV VTI:            0.421 m AV Peak Grad:      12.7 mmHg AV Mean Grad:      5.0 mmHg LVOT Vmax:         143.00 cm/s LVOT Vmean:        79.000 cm/s LVOT VTI:          0.305 m LVOT/AV VTI ratio: 0.72  AORTA Ao Root diam: 3.00 cm MITRAL VALVE MV Area (PHT): 2.60 cm    SHUNTS MV Peak grad:  5.0 mmHg    Systemic VTI:  0.30 m MV Mean grad:  3.0 mmHg    Systemic Diam: 1.90 cm MV Vmax:       1.12 m/s MV Vmean:      76.0 cm/s MV Decel Time: 292 msec MV E velocity: 95.55 cm/s MV A velocity: 82.00 cm/s MV E/A ratio:  1.17 Nelva Bush MD Electronically signed by Nelva Bush MD Signature Date/Time: 07/03/2019/1:52:19 PM    Final     Medications:   .  aspirin EC  81 mg Oral Daily  . atorvastatin  10 mg Oral q1800  . citalopram  20 mg Oral Daily  . epoetin (EPOGEN/PROCRIT) injection  10,000 Units Subcutaneous Weekly  . furosemide  40 mg Intravenous Daily  . levETIRAcetam  500 mg Oral BID  . patiromer  16.8 g Oral Daily  . sodium chloride flush  10 mL Intravenous Q12H   Continuous Infusions:   LOS: 2 days   Jeronda Don  [facility]     07/04/2019, 2:47 PM

## 2019-07-04 NOTE — Progress Notes (Signed)
This RN and another RN unsuccessful in foley catheter insertion attempts possibly d/t patient traumatic past resulting in scar tissue. Hospitalist and Nephrologist notified for need of urology consult.

## 2019-07-05 DIAGNOSIS — E875 Hyperkalemia: Secondary | ICD-10-CM | POA: Diagnosis not present

## 2019-07-05 DIAGNOSIS — R0602 Shortness of breath: Secondary | ICD-10-CM | POA: Diagnosis not present

## 2019-07-05 DIAGNOSIS — I10 Essential (primary) hypertension: Secondary | ICD-10-CM | POA: Diagnosis not present

## 2019-07-05 DIAGNOSIS — R001 Bradycardia, unspecified: Secondary | ICD-10-CM | POA: Diagnosis not present

## 2019-07-05 DIAGNOSIS — N179 Acute kidney failure, unspecified: Secondary | ICD-10-CM | POA: Diagnosis not present

## 2019-07-05 LAB — CBC WITH DIFFERENTIAL/PLATELET
Abs Immature Granulocytes: 0.01 10*3/uL (ref 0.00–0.07)
Basophils Absolute: 0 10*3/uL (ref 0.0–0.1)
Basophils Relative: 0 %
Eosinophils Absolute: 0.1 10*3/uL (ref 0.0–0.5)
Eosinophils Relative: 1 %
HCT: 24.1 % — ABNORMAL LOW (ref 36.0–46.0)
Hemoglobin: 7.9 g/dL — ABNORMAL LOW (ref 12.0–15.0)
Immature Granulocytes: 0 %
Lymphocytes Relative: 15 %
Lymphs Abs: 0.6 10*3/uL — ABNORMAL LOW (ref 0.7–4.0)
MCH: 36.1 pg — ABNORMAL HIGH (ref 26.0–34.0)
MCHC: 32.8 g/dL (ref 30.0–36.0)
MCV: 110 fL — ABNORMAL HIGH (ref 80.0–100.0)
Monocytes Absolute: 0.3 10*3/uL (ref 0.1–1.0)
Monocytes Relative: 8 %
Neutro Abs: 3.1 10*3/uL (ref 1.7–7.7)
Neutrophils Relative %: 76 %
Platelets: 124 10*3/uL — ABNORMAL LOW (ref 150–400)
RBC: 2.19 MIL/uL — ABNORMAL LOW (ref 3.87–5.11)
RDW: 13.2 % (ref 11.5–15.5)
WBC: 4.2 10*3/uL (ref 4.0–10.5)
nRBC: 0 % (ref 0.0–0.2)

## 2019-07-05 LAB — BASIC METABOLIC PANEL
Anion gap: 7 (ref 5–15)
BUN: 55 mg/dL — ABNORMAL HIGH (ref 8–23)
CO2: 17 mmol/L — ABNORMAL LOW (ref 22–32)
Calcium: 9.6 mg/dL (ref 8.9–10.3)
Chloride: 115 mmol/L — ABNORMAL HIGH (ref 98–111)
Creatinine, Ser: 3.99 mg/dL — ABNORMAL HIGH (ref 0.44–1.00)
GFR calc Af Amer: 12 mL/min — ABNORMAL LOW (ref >60)
GFR calc non Af Amer: 11 mL/min — ABNORMAL LOW (ref >60)
Glucose, Bld: 85 mg/dL (ref 70–99)
Potassium: 4.5 mmol/L (ref 3.5–5.1)
Sodium: 139 mmol/L (ref 135–145)

## 2019-07-05 MED ORDER — FUROSEMIDE 40 MG PO TABS
40.0000 mg | ORAL_TABLET | Freq: Every day | ORAL | Status: DC
  Administered 2019-07-05 – 2019-07-07 (×3): 40 mg via ORAL
  Filled 2019-07-05 (×3): qty 1

## 2019-07-05 NOTE — Progress Notes (Signed)
Progress Note    Leslie Houston  XIP:[id number] DOB: 1951-03-24  DOA: 07/02/2019 PCP: Hubbard Hartshorn, FNP      Brief Narrative:    Medical records reviewed and are as summarized below:  Leslie Houston is an 69 y.o. 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  Patient has had exertional shortness of breath and worsening lower extremity swelling and increased abdominal girth for about 2 months.    She had been on multiple oral diuretic therapy without any significant improvement in her swelling. She is also noted to have worsening of her renal function from her baseline and her creatinine has gone from 1.3 in 2019  >> 3.49 with a GFR of 15.29ml/min.  She had 2 pillow orthopnea but did not have any chest  pain, nausea, vomiting, diaphoresis, palpitations or any changes in her bowel habits. Patient also has a 3 g drop in her H&H from 11.8g/dl to 8.7g/dl and potassium is slightly elevated at 5.3      Assessment/Plan:   Principal Problem:   AKI (acute kidney injury) ([place]) Active Problems:   Essential hypertension   GERD   Seizures (HCC)   Anemia of chronic disease   Bradycardia   Hyperkalemia   Sinus bradycardia with sinus pauses: Asymptomatic.  Patient was noted to have a 5-second pause and 7-second pause on telemetry that occurred between 5 PM and 7 AM on 07/03/2019. 2D echo showed EF estimated at 50 to 67%, grade 2 diastolic dysfunction. Patient has been evaluated by cardiologist/electrophysiologist and pacemaker is not recommended at this time.  AKI on CKD stage III /hyperkalemia/metabolic acidosis/peripheral edema: Creatinine is slowly trending down in potassium is better.  Continue Veltassa  Acute urinary retention/severe  bilateral hydronephrosis: Foley catheter placed by urologist, Dr. Bernardo Heater.  He recommended that Foley be kept in place for 10 to 14 days.  Outpatient follow-up with urologist.  Acute on chronic diastolic CHF/peripheral edema: IV Lasix changed to PO Lasix by nephrologist.  2D echo showed EF estimated at 50 to 55% and grade 2 diastolic dysfunction.  Acute on chronic anemia/anemia of chronic kidney disease: Monitor H&H.  Seizure disorder: Continue Keppra.  Hypertension: Monitor BP closely.  Patient is on Lasix.  Body mass index is 40.66 kg/m.  (Morbid obesity).  Weight loss advised.  Outpatient sleep study recommended to exclude obstructive sleep apnea   Family Communication/Anticipated D/C date and plan/Code Status   DVT prophylaxis:  Code Status: Full code Family Communication: Plan discussed with patient Disposition Plan: Patient is from home with plan to discharge home.  She will be discharged home when cleared by cardiologist and nephrologist.     Subjective:   Overnight events noted.  Patient had a Foley catheter placed by the urologist yesterday for acute urinary retention.  Nurse was unable to place Foley catheter   Objective:    Vitals:   07/04/19 1950 07/05/19 0503 07/05/19 0838 07/05/19 1101  BP: (!) 155/69 (!) 142/67 136/61 (!) 135/53  Pulse: 61 60 (!) 55 (!) 53  Resp: 20 20 18 18   Temp: (!) 97.5 F (36.4 C) 97.6 F (36.4 C) (!) 97.5 F (36.4 C) 97.6 F (36.4 C)  TempSrc: Oral Oral Oral Oral  SpO2: 97% 93% 96% 98%  Weight:      Height:  Intake/Output Summary (Last 24 hours) at 07/05/2019 1538 Last data filed at 07/05/2019 1058 Gross per 24 hour  Intake 240 ml  Output 3750 ml  Net -3510 ml   Filed Weights   07/02/19 1500 07/03/19 0449 07/04/19 0527  Weight: 98.7 kg 98 kg 97.6 kg    Exam:  GEN: No acute distress SKIN: No rash EYES: EOMI ENT: MMM CV: Regular rhythm but bradycardic PULM: No wheezing or rales head ABD: soft, obese, NT,  +BS CNS: AAO x 3, non focal EXT: Bilateral leg edema (1+), no tenderness   Data Reviewed:   I have personally reviewed following labs and imaging studies:  Labs: Labs show the following:   Basic Metabolic Panel: Recent Labs  Lab 07/02/19 1103 07/02/19 1103 07/03/19 0755 07/03/19 0755 07/04/19 0606 07/05/19 0513  NA 138  --  142  --  140 139  K 5.2*   < > 5.7*   < > 4.6 4.5  CL 114*  --  117*  --  112* 115*  CO2 17*  --  21*  --  17* 17*  GLUCOSE 87  --  87  --  79 85  BUN 53*  --  55*  --  60* 55*  CREATININE 3.49*  --  3.95*  --  4.06* 3.99*  CALCIUM 9.3  --  9.4  --  9.8 9.6   < > = values in this interval not displayed.   GFR Estimated Creatinine Clearance: 14 mL/min (A) (by C-G formula based on SCr of 3.99 mg/dL (H)). Liver Function Tests: Recent Labs  Lab 07/02/19 1103  AST 23  ALT 26  ALKPHOS 100  BILITOT 0.6  PROT 7.4  ALBUMIN 4.1   No results for input(s): LIPASE, AMYLASE in the last 168 hours. No results for input(s): AMMONIA in the last 168 hours. Coagulation profile No results for input(s): INR, PROTIME in the last 168 hours.  CBC: Recent Labs  Lab 07/02/19 1103 07/03/19 0755 07/05/19 0513  WBC 4.5 3.8* 4.2  NEUTROABS 3.4  --  3.1  HGB 8.7* 8.4* 7.9*  HCT 26.1* 25.0* 24.1*  MCV 105.7* 108.2* 110.0*  PLT 136* 129* 124*   Cardiac Enzymes: No results for input(s): CKTOTAL, CKMB, CKMBINDEX, TROPONINI in the last 168 hours. BNP (last 3 results) No results for input(s): PROBNP in the last 8760 hours. CBG: No results for input(s): GLUCAP in the last 168 hours. D-Dimer: No results for input(s): DDIMER in the last 72 hours. Hgb A1c: No results for input(s): HGBA1C in the last 72 hours. Lipid Profile: No results for input(s): CHOL, HDL, LDLCALC, TRIG, CHOLHDL, LDLDIRECT in the last 72 hours. Thyroid function studies: No results for input(s): TSH, T4TOTAL, T3FREE, THYROIDAB in the last 72 hours.  Invalid input(s): FREET3 Anemia work  up: No results for input(s): VITAMINB12, FOLATE, FERRITIN, TIBC, IRON, RETICCTPCT in the last 72 hours. Sepsis Labs: Recent Labs  Lab 07/02/19 1103 07/03/19 0755 07/05/19 0513  WBC 4.5 3.8* 4.2    Microbiology Recent Results (from the past 240 hour(s))  Respiratory Panel by RT PCR (Flu A&B, Covid) - Nasopharyngeal Swab     Status: None   Collection Time: 07/02/19 11:46 AM   Specimen: Nasopharyngeal Swab  Result Value Ref Range Status   SARS Coronavirus 2 by RT PCR NEGATIVE NEGATIVE Final    Comment: (NOTE) SARS-CoV-2 target nucleic acids are NOT DETECTED. The SARS-CoV-2 RNA is generally detectable in upper respiratoy specimens during the acute phase of infection. The lowest concentration  of SARS-CoV-2 viral copies this assay can detect is 131 copies/mL. A negative result does not preclude SARS-Cov-2 infection and should not be used as the sole basis for treatment or other patient management decisions. A negative result may occur with  improper specimen collection/handling, submission of specimen other than nasopharyngeal swab, presence of viral mutation(s) within the areas targeted by this assay, and inadequate number of viral copies (<131 copies/mL). A negative result must be combined with clinical observations, patient history, and epidemiological information. The expected result is Negative. Fact Sheet for Patients:  PinkCheek.be Fact Sheet for Healthcare Providers:  GravelBags.it This test is not yet ap proved or cleared by the [place] FDA and  has been authorized for detection and/or diagnosis of SARS-CoV-2 by FDA under an Emergency Use Authorization (EUA). This EUA will remain  in effect (meaning this test can be used) for the duration of the COVID-19 declaration under Section 564(b)(1) of the Act, 21 U.S.C. section 360bbb-3(b)(1), unless the authorization is terminated or revoked sooner.    Influenza A  by PCR NEGATIVE NEGATIVE Final   Influenza B by PCR NEGATIVE NEGATIVE Final    Comment: (NOTE) The Xpert Xpress SARS-CoV-2/FLU/RSV assay is intended as an aid in  the diagnosis of influenza from Nasopharyngeal swab specimens and  should not be used as a sole basis for treatment. Nasal washings and  aspirates are unacceptable for Xpert Xpress SARS-CoV-2/FLU/RSV  testing. Fact Sheet for Patients: PinkCheek.be Fact Sheet for Healthcare Providers: GravelBags.it This test is not yet approved or cleared by the [place] FDA and  has been authorized for detection and/or diagnosis of SARS-CoV-2 by  FDA under an Emergency Use Authorization (EUA). This EUA will remain  in effect (meaning this test can be used) for the duration of the  Covid-19 declaration under Section 564(b)(1) of the Act, 21  U.S.C. section 360bbb-3(b)(1), unless the authorization is  terminated or revoked. Performed at [facility], [address], [place], [place] [address]   Urine culture     Status: None   Collection Time: 07/02/19 11:48 AM   Specimen: Urine, Random  Result Value Ref Range Status   Specimen Description   Final    URINE, RANDOM Performed at [facility], [address], [place], [place] [address]    Special Requests   Final    NONE Performed at [facility], [address], [place], [place] [address]    Culture   Final    NO GROWTH Performed at [facility], [place] [address], [place], [place] [address]    Report Status 07/03/2019 FINAL  Final    Procedures and diagnostic studies:  US RENAL  Result Date: 2019-07-12 CLINICAL DATA:  Acute kidney failure. EXAM: RENAL / URINARY TRACT ULTRASOUND COMPLETE COMPARISON:  Abdomen and pelvis CT dated 08/18/2014 FINDINGS: Right Kidney: Renal measurements: 11.4 x 7.8 x 6.8 cm = volume: 321 mL. Mildly echogenic with diffuse cortical thinning. Marked dilatation of  the renal collecting system. Left Kidney: Renal measurements: 11.2 x 6.9 x 5.4 cm = volume: 218 mL. Mildly echogenic with diffuse cortical thinning. Marked dilatation of the renal collecting system. Bladder: Dilated bladder with no visible mass. The calculated bladder volume is 1650 cc. Other: None. IMPRESSION: 1. Chronically dilated bladder with marked bilateral hydronephrosis. 2. Diffuse bilateral renal cortical thinning and mildly increased echogenicity, most likely due to chronic bilateral hydronephrosis and mild medical renal disease. Electronically Signed   By: Claudie Revering M.D.   On:  07/04/2019 11:24    Medications:   . aspirin EC  81 mg Oral Daily  . atorvastatin  10 mg Oral q1800  . Chlorhexidine Gluconate Cloth  6 each Topical Daily  . citalopram  20 mg Oral Daily  . epoetin (EPOGEN/PROCRIT) injection  10,000 Units Subcutaneous Weekly  . furosemide  40 mg Oral Daily  . levETIRAcetam  500 mg Oral BID  . patiromer  16.8 g Oral Daily  . sodium chloride flush  10 mL Intravenous Q12H   Continuous Infusions:   LOS: 3 days   Zamarian Scarano  [facility]     07/05/2019, 3:38 PM

## 2019-07-05 NOTE — Progress Notes (Signed)
[facility], [place] 07/05/19  Subjective:   LOS: 3 03/06 0701 - 03/07 0700 In: 240 [P.O.:240] Out: 3800 [Urine:3800] After further evaluation patient found to have hydronephrosis bilaterally yesterday. Urine output was 3.8 L after Foley catheter placement. Renal function is starting to improve his creatinine down to 3.9.   Objective:  Vital signs in last 24 hours:  Temp:  [97.5 F (36.4 C)-97.7 F (36.5 C)] 97.6 F (36.4 C) (03/07 1101) Pulse Rate:  [53-61] 53 (03/07 1101) Resp:  [18-20] 18 (03/07 1101) BP: (135-155)/(53-69) 135/53 (03/07 1101) SpO2:  [93 %-98 %] 98 % (03/07 1101)  Weight change:  Filed Weights   07/02/19 1500 07/03/19 0449 07/04/19 0527  Weight: 98.7 kg 98 kg 97.6 kg    Intake/Output:    Intake/Output Summary (Last 24 hours) at 07/05/2019 1234 Last data filed at 07/05/2019 1058 Gross per 24 hour  Intake 240 ml  Output 3750 ml  Net -3510 ml     Physical Exam: General:  Chronically ill-appearing, laying in the bed  HEENT  moist oral mucous membranes  Pulm/lungs  clear bilateral, normal effort  CVS/Heart  irregular  Abdomen:   Soft, mildly distended, obese  Extremities:  1-2+ pitting edema  Neurologic:  Alert, able to answer questions appropriately  Skin:  Warm, dry  Access:  None       Basic Metabolic Panel:  Recent Labs  Lab 07/02/19 1103 07/02/19 1103 07/03/19 0755 07/04/19 0606 07/05/19 0513  NA 138  --  142 140 139  K 5.2*  --  5.7* 4.6 4.5  CL 114*  --  117* 112* 115*  CO2 17*  --  21* 17* 17*  GLUCOSE 87  --  87 79 85  BUN 53*  --  55* 60* 55*  CREATININE 3.49*  --  3.95* 4.06* 3.99*  CALCIUM 9.3   < > 9.4 9.8 9.6   < > = values in this interval not displayed.     CBC: Recent Labs  Lab 07/02/19 1103 07/03/19 0755 07/05/19 0513  WBC 4.5 3.8* 4.2  NEUTROABS 3.4  --  3.1  HGB 8.7* 8.4* 7.9*  HCT 26.1* 25.0* 24.1*  MCV 105.7* 108.2* 110.0*  PLT 136* 129* 124*     No results found for:  HEPBSAG, HEPBSAB, HEPBIGM    Microbiology:  Recent Results (from the past 240 hour(s))  Respiratory Panel by RT PCR (Flu A&B, Covid) - Nasopharyngeal Swab     Status: None   Collection Time: 07/02/19 11:46 AM   Specimen: Nasopharyngeal Swab  Result Value Ref Range Status   SARS Coronavirus 2 by RT PCR NEGATIVE NEGATIVE Final    Comment: (NOTE) SARS-CoV-2 target nucleic acids are NOT DETECTED. The SARS-CoV-2 RNA is generally detectable in upper respiratoy specimens during the acute phase of infection. The lowest concentration of SARS-CoV-2 viral copies this assay can detect is 131 copies/mL. A negative result does not preclude SARS-Cov-2 infection and should not be used as the sole basis for treatment or other patient management decisions. A negative result may occur with  improper specimen collection/handling, submission of specimen other than nasopharyngeal swab, presence of viral mutation(s) within the areas targeted by this assay, and inadequate number of viral copies (<131 copies/mL). A negative result must be combined with clinical observations, patient history, and epidemiological information. The expected result is Negative. Fact Sheet for Patients:  PinkCheek.be Fact Sheet for Healthcare Providers:  GravelBags.it This test is not yet ap proved or cleared by  the [employer] and  has been authorized for detection and/or diagnosis of SARS-CoV-2 by FDA under an Emergency Use Authorization (EUA). This EUA will remain  in effect (meaning this test can be used) for the duration of the COVID-19 declaration under Section 564(b)(1) of the Act, 21 U.S.C. section 360bbb-3(b)(1), unless the authorization is terminated or revoked sooner.    Influenza A by PCR NEGATIVE NEGATIVE Final   Influenza B by PCR NEGATIVE NEGATIVE Final    Comment: (NOTE) The Xpert Xpress SARS-CoV-2/FLU/RSV assay is intended as an aid in  the  diagnosis of influenza from Nasopharyngeal swab specimens and  should not be used as a sole basis for treatment. Nasal washings and  aspirates are unacceptable for Xpert Xpress SARS-CoV-2/FLU/RSV  testing. Fact Sheet for Patients: PinkCheek.be Fact Sheet for Healthcare Providers: GravelBags.it This test is not yet approved or cleared by the [place] FDA and  has been authorized for detection and/or diagnosis of SARS-CoV-2 by  FDA under an Emergency Use Authorization (EUA). This EUA will remain  in effect (meaning this test can be used) for the duration of the  Covid-19 declaration under Section 564(b)(1) of the Act, 21  U.S.C. section 360bbb-3(b)(1), unless the authorization is  terminated or revoked. Performed at [facility], [address] [place], [place] [address]   Urine culture     Status: None   Collection Time: 07/02/19 11:48 AM   Specimen: Urine, Random  Result Value Ref Range Status   Specimen Description   Final    URINE, RANDOM Performed at [facility], [address] [place] [address], [place], [place] [address]    Special Requests   Final    NONE Performed at [facility], [address], [place], [place] [address]    Culture   Final    NO GROWTH Performed at [facility], [place] [address] [place], [place] [address]    Report Status 07/03/2019 FINAL  Final    Coagulation Studies: No results for input(s): LABPROT, INR in the last 72 hours.  Urinalysis: No results for input(s): COLORURINE, LABSPEC, PHURINE, GLUCOSEU, HGBUR, BILIRUBINUR, KETONESUR, PROTEINUR, UROBILINOGEN, NITRITE, LEUKOCYTESUR in the last 72 hours.  Invalid input(s): APPERANCEUR    Imaging: US RENAL  Result Date: 07/04/2019 CLINICAL DATA:  Acute kidney failure. EXAM: RENAL / URINARY TRACT ULTRASOUND COMPLETE COMPARISON:  Abdomen and pelvis CT dated 08/18/2014 FINDINGS: Right Kidney: Renal measurements: 11.4 x  7.8 x 6.8 cm = volume: 321 mL. Mildly echogenic with diffuse cortical thinning. Marked dilatation of the renal collecting system. Left Kidney: Renal measurements: 11.2 x 6.9 x 5.4 cm = volume: 218 mL. Mildly echogenic with diffuse cortical thinning. Marked dilatation of the renal collecting system. Bladder: Dilated bladder with no visible mass. The calculated bladder volume is 1650 cc. Other: None. IMPRESSION: 1. Chronically dilated bladder with marked bilateral hydronephrosis. 2. Diffuse bilateral renal cortical thinning and mildly increased echogenicity, most likely due to chronic bilateral hydronephrosis and mild medical renal disease. Electronically Signed   By: Claudie Revering M.D.   On: 07/04/2019 11:24     Medications:    . aspirin EC  81 mg Oral Daily  . atorvastatin  10 mg Oral q1800  . Chlorhexidine Gluconate Cloth  6 each Topical Daily  . citalopram  20 mg Oral Daily  . epoetin (EPOGEN/PROCRIT) injection  10,000 Units Subcutaneous Weekly  . furosemide  40 mg Oral Daily  . levETIRAcetam  500 mg Oral BID  . patiromer  16.8  g Oral Daily  . sodium chloride flush  10 mL Intravenous Q12H   acetaminophen **OR** acetaminophen, diclofenac sodium, fluticasone, ondansetron **OR** ondansetron (ZOFRAN) IV, senna-docusate  Assessment/ Plan:  69 y.o. female with HTN, Carotid stenosis, previous h/o hydronephrosis, seizure, stroke, anxiety, CKD admitted with following issues  Principal Problem:   AKI (acute kidney injury) ([place]) Active Problems:   Essential hypertension   GERD   Seizures ([place])   Anemia of chronic disease   Bradycardia   Hyperkalemia   #. 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 Recent Labs    07/02/19 1103 07/03/19 0755 07/04/19 0606 07/05/19 0513  CREATININE 3.49* 3.95* 4.06* 3.99*  Baseline Creatinine from 01/07/2019 is 1.59/GFR 38 Progressively worsening since then Screening auto immune serologies, SPEP, UPEP  neg from 04/17/2019 Mildly elevated k/l ration  likely secondary to CKD Renal ultrasound revealed bilateral hydronephrosis.  The ultrasound revealed bilateral hydronephrosis yesterday.  Thereafter urology consultation undertaken as nursing had difficulty placing Foley catheter.  Foley catheter successfully placed in renal function is started to improve.  BUN currently 55 with a creatinine of 3.99 and urine output of 3.8 L.   #. Anemia of CKD  Lab Results  Component Value Date   HGB 7.9 (L) 07/05/2019  Continue Epogen 10,000 units subcutaneous weekly.  #. HTN with lower extremity edema We will switch patient to furosemide 40 mg p.o. daily.  #Hyperkalemia Potassium acceptable at 4.5.  Okay to maintain patient on Veltassa however dose may be able to be decreased as the obstruction has now been relieved.   LOS: 3 Thane Age 3/7/202112:34 PM  [facility] [place], [place]

## 2019-07-05 NOTE — Progress Notes (Signed)
Patient noted to have sinus brady while asleep, at times in low 30's.  Call received from [facility] that patient had 4 second pause.  This RN in to see patient. Patient was asleep, and awoke with no issues. Dr. Mal Misty made aware.

## 2019-07-06 DIAGNOSIS — R0602 Shortness of breath: Secondary | ICD-10-CM | POA: Diagnosis not present

## 2019-07-06 DIAGNOSIS — E875 Hyperkalemia: Secondary | ICD-10-CM | POA: Diagnosis not present

## 2019-07-06 DIAGNOSIS — N179 Acute kidney failure, unspecified: Secondary | ICD-10-CM | POA: Diagnosis not present

## 2019-07-06 DIAGNOSIS — R569 Unspecified convulsions: Secondary | ICD-10-CM | POA: Diagnosis not present

## 2019-07-06 DIAGNOSIS — R001 Bradycardia, unspecified: Secondary | ICD-10-CM | POA: Diagnosis not present

## 2019-07-06 LAB — CBC WITH DIFFERENTIAL/PLATELET
Abs Immature Granulocytes: 0.01 10*3/uL (ref 0.00–0.07)
Basophils Absolute: 0 10*3/uL (ref 0.0–0.1)
Basophils Relative: 1 %
Eosinophils Absolute: 0.1 10*3/uL (ref 0.0–0.5)
Eosinophils Relative: 1 %
HCT: 24.4 % — ABNORMAL LOW (ref 36.0–46.0)
Hemoglobin: 8.2 g/dL — ABNORMAL LOW (ref 12.0–15.0)
Immature Granulocytes: 0 %
Lymphocytes Relative: 15 %
Lymphs Abs: 0.7 10*3/uL (ref 0.7–4.0)
MCH: 36.9 pg — ABNORMAL HIGH (ref 26.0–34.0)
MCHC: 33.6 g/dL (ref 30.0–36.0)
MCV: 109.9 fL — ABNORMAL HIGH (ref 80.0–100.0)
Monocytes Absolute: 0.4 10*3/uL (ref 0.1–1.0)
Monocytes Relative: 9 %
Neutro Abs: 3.3 10*3/uL (ref 1.7–7.7)
Neutrophils Relative %: 74 %
Platelets: 134 10*3/uL — ABNORMAL LOW (ref 150–400)
RBC: 2.22 MIL/uL — ABNORMAL LOW (ref 3.87–5.11)
RDW: 13.4 % (ref 11.5–15.5)
WBC: 4.4 10*3/uL (ref 4.0–10.5)
nRBC: 0 % (ref 0.0–0.2)

## 2019-07-06 LAB — BASIC METABOLIC PANEL
Anion gap: 5 (ref 5–15)
BUN: 52 mg/dL — ABNORMAL HIGH (ref 8–23)
CO2: 19 mmol/L — ABNORMAL LOW (ref 22–32)
Calcium: 8.9 mg/dL (ref 8.9–10.3)
Chloride: 114 mmol/L — ABNORMAL HIGH (ref 98–111)
Creatinine, Ser: 3.76 mg/dL — ABNORMAL HIGH (ref 0.44–1.00)
GFR calc Af Amer: 13 mL/min — ABNORMAL LOW (ref >60)
GFR calc non Af Amer: 12 mL/min — ABNORMAL LOW (ref >60)
Glucose, Bld: 88 mg/dL (ref 70–99)
Potassium: 4 mmol/L (ref 3.5–5.1)
Sodium: 138 mmol/L (ref 135–145)

## 2019-07-06 LAB — GLUCOSE, CAPILLARY: Glucose-Capillary: 88 mg/dL (ref 70–99)

## 2019-07-06 MED ORDER — HEPARIN SODIUM (PORCINE) 5000 UNIT/ML IJ SOLN
5000.0000 [IU] | Freq: Three times a day (TID) | INTRAMUSCULAR | Status: DC
  Administered 2019-07-06 – 2019-07-07 (×3): 5000 [IU] via SUBCUTANEOUS
  Filled 2019-07-06 (×3): qty 1

## 2019-07-06 NOTE — Evaluation (Signed)
Physical Therapy Evaluation Patient Details Name: Leslie Houston MRN: [id number] DOB: 07/29/1950 Today's Date: 07/06/2019   History of Present Illness  Leslie Houston is a 47yoF who comes to [facility] on 07/02/19 c SOB. Pt referred to the emergency room by her nephrologist evaluation of worsening renal function and generalized body swelling refractory to diuretic therapy. 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 Worsening kidney function and concern for fluid overload.  X-ray showed interstitial edema and BNP is elevated. Pt followed by cardiology this admission after a 7-second pause noted on telemetry.  Clinical Impression  Pt admitted with above diagnosis. Pt currently with functional limitations due to the deficits listed below (see "PT Problem List"). Upon entry, pt in bed, awake and agreeable to participate. The pt is alert and oriented x4, pleasant, conversational, and generally a fair historian, some point correction needed from caregiver. Pt requires no physical assistance for any mobility, all performed at supervision level with RW. Pt typically uses a SPC PTA, but is happy to use RW and AMB household distances without any signficant limitation. Functional mobility assessment demonstrates increased effort/time requirements, poor tolerance, and need for physical assistance, whereas the patient performed these at a higher level of independence PTA. Pt will benefit from skilled PT intervention to increase independence and safety with basic mobility in preparation for discharge to the venue listed below.       Follow Up Recommendations Home health PT    Equipment Recommendations  Rolling walker with 5" wheels    Recommendations for Other Services       Precautions / Restrictions Precautions Precautions: Fall Restrictions Weight Bearing Restrictions: No      Mobility  Bed Mobility Overal bed mobility: Modified Independent                 Transfers Overall transfer level: Needs assistance Equipment used: Rolling walker (2 wheeled) Transfers: Sit to/from Stand Sit to Stand: Min guard;Supervision         General transfer comment: CGA provided on initial stand for assessment and safety, but ultimately pt completes with supv with one cue for hand placement.  Ambulation/Gait Ambulation/Gait assistance: Supervision;Min guard Gait Distance (Feet): 240 Feet Assistive device: Rolling walker (2 wheeled) Gait Pattern/deviations: Step-through pattern Gait velocity: 0.69m/s   General Gait Details: moving well, reports to feel good; terminall SpO2: 91%  Stairs            Wheelchair Mobility    Modified Rankin (Stroke Patients Only)       Balance Overall balance assessment: Needs assistance;Mild deficits observed, not formally tested Sitting-balance support: No upper extremity supported Sitting balance-Leahy Scale: Good     Standing balance support: Single extremity supported Standing balance-Leahy Scale: Fair Standing balance comment: requires at least support of 1 UE for fair static standing balance, balance improves with support of bilateral UEs.                             Pertinent Vitals/Pain Pain Assessment: No/denies pain(aide reports pt was having some catheter related urethral pain)    Home Living Family/patient expects to be discharged to:: Private residence Living Arrangements: Spouse/significant other(s/o has hx stroke, unable to assist) Available Help at Discharge: Family;Personal care attendant(PCA 2-3 days/wk for about an hour for meds and transportation) Type of Home: Apartment(handicap apt) Home Access: Level entry     Home Layout: One level Home Equipment: Cane - single point;Wheelchair -  manual;Bedside commode;Tub bench Additional Comments: has WC that used to belong to deceased son.    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  ADL's / Homemaking Assistance Needed: independent; cooks (light meal prep), PCA gets groceries, but expresses concern as one of the pantries they visited has closed and inquires abt meals on wheels, OT referred to case mgt        Hand Dominance        Extremity/Trunk Assessment   Upper Extremity Assessment Upper Extremity Assessment: Overall WFL for tasks assessed(grossly 4-/5 bilaterally shld, elbow, grip)    Lower Extremity Assessment Lower Extremity Assessment: Defer to PT evaluation       Communication   Communication: HOH  Cognition Arousal/Alertness: Awake/alert Behavior During Therapy: WFL for tasks assessed/performed Overall Cognitive Status: Within Functional Limits for tasks assessed                                 General Comments: some confusion r/t Leslie Houston      General Comments      Exercises Other Exercises Other Exercises: OT facilitates education re: role of OT both in acute setting and potentially for home. Pt and caregiver with understanding. Other Exercises: OT facilitates education re: fall prevention considerations for home. Requires f/u.   Assessment/Plan    PT Assessment Patient needs continued PT services  PT Problem List Decreased strength;Decreased activity tolerance;Decreased balance;Decreased mobility;Decreased cognition       PT Treatment Interventions DME instruction;Therapeutic exercise;Gait training;Balance training;Stair training;Functional mobility training;Therapeutic activities;Patient/family education    PT Goals (Current goals can be found in the Care Plan section)  Acute Rehab PT Goals Patient Stated Goal: to go home soon PT Goal Formulation: With patient Time For Goal Achievement: 07/20/19 Potential to Achieve Goals: Good    Frequency Min 2X/week   Barriers to discharge         Co-evaluation               AM-PAC PT "6 Clicks" Mobility  Outcome Measure Help needed turning from your back to your side while in a flat bed without using bedrails?: A Little Help needed moving from lying on your back to sitting on the side of a flat bed without using bedrails?: A Little Help needed moving to and from a bed to a chair (including a wheelchair)?: A Little Help needed standing up from a chair using your arms (e.g., wheelchair or bedside chair)?: A Little Help needed to walk in hospital room?: A Little Help needed climbing 3-5 steps with a railing? : A Lot 6 Click Score: 17    End of Session   Activity Tolerance: Patient tolerated treatment well;No increased pain Patient left: in bed;with nursing/sitter in room;with call bell/phone within reach;with family/visitor present Nurse Communication: Mobility status PT Visit Diagnosis: Unsteadiness on feet (R26.81);Other abnormalities of gait and mobility (R26.89);Difficulty in walking, not elsewhere classified (R26.2)    Time: 6314-9702 PT Time Calculation (min) (ACUTE ONLY): 26 min   Charges:   PT Evaluation $PT Eval Moderate Complexity: 1 Mod PT Treatments $Therapeutic Exercise: 8-22 mins        5:39 PM, 07/06/19 Etta Grandchild, PT, DPT Physical Therapist - [facility]  [phone] ([place])    Andrzej Scully C 07/06/2019, 5:36 PM

## 2019-07-06 NOTE — Progress Notes (Signed)
Progress Note    Leslie Houston  VVO:[id number] DOB: [birth year]  DOA: 07/02/2019 PCP: Hubbard Hartshorn, FNP      Brief Narrative:    Medical records reviewed and are as summarized below:  Leslie Houston is an 69 y.o. 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 She isalso noted to have worsening of herrenal function from her baseline and her creatinine has gone from 1.3in 2019>>3.49 with aGFR of 15.40ml/min.She had 2 pillow orthopnea but did not have any chest  pain,nausea,vomiting,diaphoresis,palpitations oranychanges in her bowel habits.Patient also has a 3 g drop in her H&H from 11.8g/dl to 8.7g/dl andpotassium is slightly elevated at 5.3       Assessment/Plan:   Principal Problem:   AKI (acute kidney injury) ([place]) Active Problems:   Essential hypertension   GERD   Seizures (HCC)   Anemia of chronic disease   Bradycardia   Hyperkalemia   Sinus bradycardia with sinus pauses: Asymptomatic.  Patient was noted to have a 5-second pause and 7-second pause on telemetry that occurred between 5 PM and 7 AM on 07/03/2019. 2D echo showed EF estimated at 50 to 26%, grade 2 diastolic dysfunction. Patient has been evaluated by cardiologist/electrophysiologist and pacemaker is not recommended at this time.  AKI on CKD stage III /hyperkalemia (resolved) /metabolic acidosis/peripheral edema: Creatinine is slowly trending down. Continue V[place]  Acute urinary retention/severe  bilateral hydronephrosis: Foley catheter placed by urologist, Dr. Bernardo Heater.  He recommended that Foley be kept in place for 10 to 14 days.  Outpatient follow-up with urologist.  Acute on chronic diastolic CHF/peripheral edema: IV Lasix changed to PO Lasix by nephrologist.  2D echo showed EF estimated at 50 to 55% and grade 2 diastolic dysfunction.  Acute on chronic anemia/anemia of chronic kidney disease: Monitor H&H.  Seizure disorder: Continue Keppra.  Hypertension: Monitor BP closely.  Patient is on Lasix   Body mass index is 40.66 kg/m.  (Morbid obesity) with supports.  Outpatient sleep study recommended for evaluation of obstructive sleep apnea   Family Communication/Anticipated D/C date and plan/Code Status   DVT prophylaxis: SCD, heparin Code Status: Full code Family Communication: Plan discussed with patient Disposition Plan: Patient is from home with plan to discharge home.  She will be discharged home when cleared by nephrologist      Subjective:   Overnight events noted.  Patient had a 4-second pause on telemetry last night around 11 PM.  No complaints.  No shortness of breath or chest pain. Leg swelling is improving.  Objective:    Vitals:   07/05/19 2004 07/06/19 0443 07/06/19 0810 07/06/19 1154  BP: 138/66 (!) 123/51 (!) 117/55 (!) 127/56  Pulse: (!) 57 (!) 59 (!) 58 (!) 55  Resp:   18 18  Temp: 98.2 F (36.8 C) 98.2 F (36.8 C) 98.5 F (36.9 C) 98.3 F (36.8 C)  TempSrc: Oral Oral Oral   SpO2: 99% 99% 97% 97%  Weight:      Height:        Intake/Output Summary (Last 24 hours) at 07/06/2019 1246 Last data filed at 07/06/2019 0945 Gross per 24  hour  Intake 240 ml  Output 3500 ml  Net -3260 ml   Filed Weights   07/02/19 1500 07/03/19 0449 07/04/19 0527  Weight: 98.7 kg 98 kg 97.6 kg    Exam:  GEN: NAD SKIN: No rash EYES: EOMI ENT: MMM CV: RRR PULM: CTA B ABD: soft, ND, NT, +BS CNS: AAO x 3, non focal EXT: B/l leg edema improving, no  tenderness   Data Reviewed:   I have personally reviewed following labs and imaging studies:  Labs: Labs show the following:   Basic Metabolic Panel: Recent Labs  Lab 07/02/19 1103 07/02/19 1103 07/03/19 0755 07/03/19 0755 07/04/19 0606 07/04/19 0606 07/05/19 0513 07/06/19 0930  NA 138  --  142  --  140  --  139 138  K 5.2*   < > 5.7*   < > 4.6   < > 4.5 4.0  CL 114*  --  117*  --  112*  --  115* 114*  CO2 17*  --  21*  --  17*  --  17* 19*  GLUCOSE 87  --  87  --  79  --  85 88  BUN 53*  --  55*  --  60*  --  55* 52*  CREATININE 3.49*  --  3.95*  --  4.06*  --  3.99* 3.76*  CALCIUM 9.3  --  9.4  --  9.8  --  9.6 8.9   < > = values in this interval not displayed.   GFR Estimated Creatinine Clearance: 14.9 mL/min (A) (by C-G formula based on SCr of 3.76 mg/dL (H)). Liver Function Tests: Recent Labs  Lab 07/02/19 1103  AST 23  ALT 26  ALKPHOS 100  BILITOT 0.6  PROT 7.4  ALBUMIN 4.1   No results for input(s): LIPASE, AMYLASE in the last 168 hours. No results for input(s): AMMONIA in the last 168 hours. Coagulation profile No results for input(s): INR, PROTIME in the last 168 hours.  CBC: Recent Labs  Lab 07/02/19 1103 07/03/19 0755 07/05/19 0513 07/06/19 0930  WBC 4.5 3.8* 4.2 4.4  NEUTROABS 3.4  --  3.1 3.3  HGB 8.7* 8.4* 7.9* 8.2*  HCT 26.1* 25.0* 24.1* 24.4*  MCV 105.7* 108.2* 110.0* 109.9*  PLT 136* 129* 124* 134*   Cardiac Enzymes: No results for input(s): CKTOTAL, CKMB, CKMBINDEX, TROPONINI in the last 168 hours. BNP (last 3 results) No results for input(s): PROBNP in the last 8760 hours. CBG: No results for input(s): GLUCAP in the last 168 hours. D-Dimer: No results for input(s): DDIMER in the last 72 hours. Hgb A1c: No results for input(s): HGBA1C in the last 72 hours. Lipid Profile: No results for input(s): CHOL, HDL, LDLCALC, TRIG, CHOLHDL, LDLDIRECT in the last 72 hours. Thyroid function studies: No results for input(s): TSH,  T4TOTAL, T3FREE, THYROIDAB in the last 72 hours.  Invalid input(s): FREET3 Anemia work up: No results for input(s): VITAMINB12, FOLATE, FERRITIN, TIBC, IRON, RETICCTPCT in the last 72 hours. Sepsis Labs: Recent Labs  Lab 07/02/19 1103 07/03/19 0755 07/05/19 0513 07/06/19 0930  WBC 4.5 3.8* 4.2 4.4    Microbiology Recent Results (from the past 240 hour(s))  Respiratory Panel by RT PCR (Flu A&B, Covid) - Nasopharyngeal Swab     Status: None   Collection Time: 07/02/19 11:46 AM   Specimen: Nasopharyngeal Swab  Result Value Ref Range Status   SARS Coronavirus 2 by RT PCR NEGATIVE NEGATIVE Final    Comment: (NOTE) SARS-CoV-2 target nucleic  acids are NOT DETECTED. The SARS-CoV-2 RNA is generally detectable in upper respiratoy specimens during the acute phase of infection. The lowest concentration of SARS-CoV-2 viral copies this assay can detect is 131 copies/mL. A negative result does not preclude SARS-Cov-2 infection and should not be used as the sole basis for treatment or other patient management decisions. A negative result may occur with  improper specimen collection/handling, submission of specimen other than nasopharyngeal swab, presence of viral mutation(s) within the areas targeted by this assay, and inadequate number of viral copies (<131 copies/mL). A negative result must be combined with clinical observations, patient history, and epidemiological information. The expected result is Negative. Fact Sheet for Patients:  PinkCheek.be Fact Sheet for Healthcare Providers:  GravelBags.it This test is not yet ap proved or cleared by the [place] FDA and  has been authorized for detection and/or diagnosis of SARS-CoV-2 by FDA under an Emergency Use Authorization (EUA). This EUA will remain  in effect (meaning this test can be used) for the duration of the COVID-19 declaration under Section 564(b)(1) of the Act, 21  U.S.C. section 360bbb-3(b)(1), unless the authorization is terminated or revoked sooner.    Influenza A by PCR NEGATIVE NEGATIVE Final   Influenza B by PCR NEGATIVE NEGATIVE Final    Comment: (NOTE) The Xpert Xpress SARS-CoV-2/FLU/RSV assay is intended as an aid in  the diagnosis of influenza from Nasopharyngeal swab specimens and  should not be used as a sole basis for treatment. Nasal washings and  aspirates are unacceptable for Xpert Xpress SARS-CoV-2/FLU/RSV  testing. Fact Sheet for Patients: PinkCheek.be Fact Sheet for Healthcare Providers: GravelBags.it This test is not yet approved or cleared by the [place] FDA and  has been authorized for detection and/or diagnosis of SARS-CoV-2 by  FDA under an Emergency Use Authorization (EUA). This EUA will remain  in effect (meaning this test can be used) for the duration of the  Covid-19 declaration under Section 564(b)(1) of the Act, 21  U.S.C. section 360bbb-3(b)(1), unless the authorization is  terminated or revoked. Performed at [facility], [address] [place], [place] [address]   Urine culture     Status: None   Collection Time: 07/02/19 11:48 AM   Specimen: Urine, Random  Result Value Ref Range Status   Specimen Description   Final    URINE, RANDOM Performed at [facility], [address], [place], [place] [address]    Special Requests   Final    NONE Performed at [facility], [address] [place], [place] [address]    Culture   Final    NO GROWTH Performed at [facility], [place] [address], [place], [place] [address]    Report Status 07/03/2019 FINAL  Final    Procedures and diagnostic studies:  No results found.  Medications:   . aspirin EC  81 mg Oral Daily  . atorvastatin  10 mg Oral q1800  . Chlorhexidine Gluconate Cloth  6 each Topical Daily  . citalopram  20 mg Oral Daily  . epoetin  (EPOGEN/PROCRIT) injection  10,000 Units Subcutaneous Weekly  . furosemide  40 mg Oral Daily  . heparin injection (subcutaneous)  5,000 Units Subcutaneous Q8H  . levETIRAcetam  500 mg Oral BID  . patiromer  16.8 g Oral Daily  . sodium chloride flush  10 mL Intravenous Q12H   Continuous Infusions:   LOS: 4 days   Leslie Houston  [facility]     07/06/2019, 12:46 PM

## 2019-07-06 NOTE — Evaluation (Signed)
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   Clinical Impression   Pt was seen for OT evaluation this date. Prior to hospital admission, pt was MOD I with fxl mobiltiy with SPC outside of the home, used tub bench for bathing, was Indep for BADLs, and required assist from caregiver for IADLs. Pt lives with friend in [place] apartment with level entry. Currently pt demonstrates impairments as described below (See OT problem list) which functionally limit her ability to perform ADL/self-care tasks. Pt currently requires MIN A with LB dressing in EOB sitting and CGA to Supv with standing ADLs/ADL mobility with RW.  Pt would benefit from skilled OT to address noted impairments and functional limitations (see below for any additional details) in order to maximize safety and independence while minimizing falls risk and caregiver burden. Upon hospital discharge, recommend HHOT to maximize pt safety and return to functional independence during meaningful occupations of daily life. Of note: pt's caregiver raises concerns about pt's friend that she lives with smoking in the home and potential effects on pt as well as potential need for meals on wheels to offer healthier meal options. OT referred pt to case mgt and left secure chat for case mgt.     Follow Up Recommendations  Home health OT;Supervision - Intermittent    Equipment Recommendations  3 in 1 bedside commode;Other (comment)(AE for LB dressing)    Recommendations for Other Services       Precautions / Restrictions  Precautions Precautions: Fall Restrictions Weight Bearing Restrictions: No      Mobility Bed Mobility Overal bed mobility: Modified Independent                Transfers Overall transfer level: Needs assistance Equipment used: Rolling walker (2 wheeled) Transfers: Sit to/from Stand Sit to Stand: Min guard;Supervision         General transfer comment: CGA provided on initial stand for assessment and safety, but ultimately pt completes with supv with one cue for hand placement.    Balance Overall balance assessment: Needs assistance Sitting-balance support: No upper extremity supported Sitting balance-Leahy Scale: Good     Standing balance support: Single extremity supported Standing balance-Leahy Scale: Fair Standing balance comment: requires at least support of 1 UE for fair static standing balance, balance improves with support of bilateral UEs.                           ADL either performed or assessed with clinical judgement   ADL                                         General ADL Comments: Setup for seated UB ADLs. Requires MIN A with LB dressing seated EOB, states this is a struggle at baseline. Requires CGA with standing ADLs and fxl mobility with RW.     Vision Baseline Vision/History: Wears glasses Wears Glasses: At all times Patient Visual Report: No change from baseline       Perception  Praxis      Pertinent Vitals/Pain Pain Assessment: No/denies pain     Hand Dominance     Extremity/Trunk Assessment Upper Extremity Assessment Upper Extremity Assessment: Overall WFL for tasks assessed(grossly 4-/5 bilaterally shld, elbow, grip)   Lower Extremity Assessment Lower Extremity Assessment: Defer to PT evaluation       Communication Communication Communication: HOH   Cognition Arousal/Alertness: Awake/alert Behavior During Therapy: WFL for tasks assessed/performed Overall Cognitive Status: Within  Functional Limits for tasks assessed                                 General Comments: some confusion r/t [facility]   General Comments       Exercises Other Exercises Other Exercises: OT facilitates education re: role of OT both in acute setting and potentially for home. Pt and caregiver with understanding. Other Exercises: OT facilitates education re: fall prevention considerations for home. Requires f/u.   Shoulder Instructions      Home Living Family/patient expects to be discharged to:: Private residence Living Arrangements: Spouse/significant other(s/o has hx stroke, unable to assist) Available Help at Discharge: Family;Personal care attendant(PCA 2-3 days/wk for about an hour for meds and transportation) Type of Home: Apartment(handicap apt) Home Access: Level entry     Home Layout: One level     Bathroom Shower/Tub: Teacher, early years/pre: Handicapped height     Home Equipment: Fults - single point;Wheelchair - [employer];Tub bench   Additional Comments: has WC that used to belong to deceased son.      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 ADL's / Homemaking Assistance Needed: independent; cooks (light meal prep), PCA gets groceries, but expresses concern as one of the pantries they visited has closed and inquires abt meals on wheels, OT referred to case mgt            OT Problem List: Decreased activity tolerance;Impaired balance (sitting and/or standing);Decreased knowledge of use of DME or AE;Cardiopulmonary status limiting activity      OT Treatment/Interventions: Self-care/ADL training;Therapeutic exercise;Energy conservation;DME and/or AE instruction;Therapeutic activities;Patient/family education;Balance training    OT Goals(Current goals can be found in  the care plan section) Acute Rehab OT Goals Patient Stated Goal: to go home soon OT Goal Formulation: With patient/family Time For Goal Achievement: 07/20/19 Potential to Achieve Goals: Good  OT Frequency: Min 2X/week   Barriers to D/C: Decreased caregiver support(pt's live in friend smokes and has hx stroke and also has poor diet per pt's caregiver report. Caregiver is only available 2-3 days a week for less than an hour and raises concerns over pt's diet.)          Co-evaluation              AM-PAC OT "6 Clicks" Daily Activity     Outcome Measure Help from another person eating meals?: None Help from another person taking care of personal grooming?: A Little Help from another person toileting, which includes using toliet, bedpan, or urinal?: A Little Help from another person bathing (including washing, rinsing, drying)?: A Little Help from another person to put on and taking off regular upper body clothing?: None Help from another person to put on and taking off regular lower body clothing?: A Little 6 Click Score: 20   End of Session  Equipment Utilized During Treatment: Administrator, arts Communication: Other (comment)(notified RN that pt's caregiver inquiring about meals on wheels for pt)  Activity Tolerance: Patient tolerated treatment well Patient left: in bed;with call bell/phone within reach;with bed alarm set;with family/visitor present(caregiver present)  OT Visit Diagnosis: Unsteadiness on feet (R26.81);Muscle weakness (generalized) (M62.81)                Time: 0525-9102 OT Time Calculation (min): 24 min Charges:  OT General Charges $OT Visit: 1 Visit OT Evaluation $OT Eval Moderate Complexity: 1 Mod OT Treatments $Self Care/Home Management : 8-22 mins  Gerrianne Scale, MS, OTR/L ascom [phone] 07/06/19, 5:21 PM

## 2019-07-06 NOTE — Progress Notes (Signed)
[facility], [place] 07/06/19  Subjective:   LOS: 4 03/07 0701 - 03/08 0700 In: -  Out: 3500 [Urine:3500] Renal function does appear to be improving. Creatinine down to 3.76. Patient in good spirits. Good urine output noted.   Objective:  Vital signs in last 24 hours:  Temp:  [98.2 F (36.8 C)-98.5 F (36.9 C)] 98.3 F (36.8 C) (03/08 1154) Pulse Rate:  [53-59] 55 (03/08 1154) Resp:  [18] 18 (03/08 1154) BP: (117-150)/(51-76) 127/56 (03/08 1154) SpO2:  [97 %-99 %] 97 % (03/08 1154)  Weight change:  Filed Weights   07/02/19 1500 07/03/19 0449 07/04/19 0527  Weight: 98.7 kg 98 kg 97.6 kg    Intake/Output:    Intake/Output Summary (Last 24 hours) at 07/06/2019 1303 Last data filed at 07/06/2019 0945 Gross per 24 hour  Intake 240 ml  Output 3500 ml  Net -3260 ml     Physical Exam: General:  Chronically ill-appearing, laying in the bed  HEENT  moist oral mucous membranes  Pulm/lungs  clear bilateral, normal effort  CVS/Heart  irregular  Abdomen:   Soft, mildly distended, obese  Extremities:  1+ pitting edema  Neurologic:  Alert, able to answer questions appropriately  Skin:  Warm, dry  Access:  None       Basic Metabolic Panel:  Recent Labs  Lab 07/02/19 1103 07/02/19 1103 07/03/19 0755 07/03/19 0755 07/04/19 0606 07/05/19 0513 07/06/19 0930  NA 138  --  142  --  140 139 138  K 5.2*  --  5.7*  --  4.6 4.5 4.0  CL 114*  --  117*  --  112* 115* 114*  CO2 17*  --  21*  --  17* 17* 19*  GLUCOSE 87  --  87  --  79 85 88  BUN 53*  --  55*  --  60* 55* 52*  CREATININE 3.49*  --  3.95*  --  4.06* 3.99* 3.76*  CALCIUM 9.3   < > 9.4   < > 9.8 9.6 8.9   < > = values in this interval not displayed.     CBC: Recent Labs  Lab 07/02/19 1103 07/03/19 0755 07/05/19 0513 07/06/19 0930  WBC 4.5 3.8* 4.2 4.4  NEUTROABS 3.4  --  3.1 3.3  HGB 8.7* 8.4* 7.9* 8.2*  HCT 26.1* 25.0* 24.1* 24.4*  MCV 105.7* 108.2* 110.0* 109.9*  PLT  136* 129* 124* 134*     No results found for: HEPBSAG, HEPBSAB, HEPBIGM    Microbiology:  Recent Results (from the past 240 hour(s))  Respiratory Panel by RT PCR (Flu A&B, Covid) - Nasopharyngeal Swab     Status: None   Collection Time: 07/02/19 11:46 AM   Specimen: Nasopharyngeal Swab  Result Value Ref Range Status   SARS Coronavirus 2 by RT PCR NEGATIVE NEGATIVE Final    Comment: (NOTE) SARS-CoV-2 target nucleic acids are NOT DETECTED. The SARS-CoV-2 RNA is generally detectable in upper respiratoy specimens during the acute phase of infection. The lowest concentration of SARS-CoV-2 viral copies this assay can detect is 131 copies/mL. A negative result does not preclude SARS-Cov-2 infection and should not be used as the sole basis for treatment or other patient management decisions. A negative result may occur with  improper specimen collection/handling, submission of specimen other than nasopharyngeal swab, presence of viral mutation(s) within the areas targeted by this assay, and inadequate number of viral copies (<131 copies/mL). A negative result must be combined with clinical  observations, patient history, and epidemiological information. The expected result is Negative. Fact Sheet for Patients:  PinkCheek.be Fact Sheet for Healthcare Providers:  GravelBags.it This test is not yet ap proved or cleared by the [place] FDA and  has been authorized for detection and/or diagnosis of SARS-CoV-2 by FDA under an Emergency Use Authorization (EUA). This EUA will remain  in effect (meaning this test can be used) for the duration of the COVID-19 declaration under Section 564(b)(1) of the Act, 21 U.S.C. section 360bbb-3(b)(1), unless the authorization is terminated or revoked sooner.    Influenza A by PCR NEGATIVE NEGATIVE Final   Influenza B by PCR NEGATIVE NEGATIVE Final    Comment: (NOTE) The Xpert Xpress  SARS-CoV-2/FLU/RSV assay is intended as an aid in  the diagnosis of influenza from Nasopharyngeal swab specimens and  should not be used as a sole basis for treatment. Nasal washings and  aspirates are unacceptable for Xpert Xpress SARS-CoV-2/FLU/RSV  testing. Fact Sheet for Patients: PinkCheek.be Fact Sheet for Healthcare Providers: GravelBags.it This test is not yet approved or cleared by the [place] FDA and  has been authorized for detection and/or diagnosis of SARS-CoV-2 by  FDA under an Emergency Use Authorization (EUA). This EUA will remain  in effect (meaning this test can be used) for the duration of the  Covid-19 declaration under Section 564(b)(1) of the Act, 21  U.S.C. section 360bbb-3(b)(1), unless the authorization is  terminated or revoked. Performed at [facility], [place], [address] [place], [place] [address]   Urine culture     Status: None   Collection Time: 07/02/19 11:48 AM   Specimen: Urine, Random  Result Value Ref Range Status   Specimen Description   Final    URINE, RANDOM Performed at [facility], [address], [place], [place]  [address]    Special Requests   Final    NONE Performed at [facility], [address] [place], [place] [address]    Culture   Final    NO GROWTH Performed at [facility], [place] [address], [place], [place] [address]    Report Status 07/03/2019 FINAL  Final    Coagulation Studies: No results for input(s): LABPROT, INR in the last 72 hours.  Urinalysis: No results for input(s): COLORURINE, LABSPEC, PHURINE, GLUCOSEU, HGBUR, BILIRUBINUR, KETONESUR, PROTEINUR, UROBILINOGEN, NITRITE, LEUKOCYTESUR in the last 72 hours.  Invalid input(s): APPERANCEUR    Imaging: No results found.   Medications:    . aspirin EC  81 mg Oral Daily  . atorvastatin  10 mg Oral q1800  . Chlorhexidine Gluconate Cloth  6 each Topical Daily  .  citalopram  20 mg Oral Daily  . epoetin (EPOGEN/PROCRIT) injection  10,000 Units Subcutaneous Weekly  . furosemide  40 mg Oral Daily  . heparin injection (subcutaneous)  5,000 Units Subcutaneous Q8H  . levETIRAcetam  500 mg Oral BID  . patiromer  16.8 g Oral Daily  . sodium chloride flush  10 mL Intravenous Q12H   acetaminophen **OR** acetaminophen, diclofenac sodium, fluticasone, ondansetron **OR** ondansetron (ZOFRAN) IV, senna-docusate  Assessment/ Plan:  69 y.o. female with HTN, Carotid stenosis, previous h/o hydronephrosis, seizure, stroke, anxiety, CKD admitted with following issues  Principal Problem:   AKI (acute kidney injury) ([place]) Active Problems:   Essential hypertension   GERD   Seizures ([place])   Anemia of chronic disease   Bradycardia   Hyperkalemia   #. 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 Recent Labs  07/03/19 0755 07/04/19 0606 07/05/19 0513 07/06/19 0930  CREATININE 3.95* 4.06* 3.99* 3.76*  Baseline Creatinine from 01/07/2019 is 1.59/GFR 38 Progressively worsening since then Screening auto immune serologies, SPEP, UPEP  neg from 04/17/2019 Mildly elevated k/l ration likely secondary to CKD Renal ultrasound revealed bilateral hydronephrosis.  -Renal function has started to improve.  Creatinine down to 3.76.  Good urine output noted.  No urgent indication for dialysis today.   #. Anemia of CKD  Lab Results  Component Value Date   HGB 8.2 (L) 07/06/2019  Continue Epogen 10,000 units subcutaneous weekly.  #. HTN with lower extremity edema Continue furosemide 40 mg daily.  #Hyperkalemia Now that obstruction has been relieved potassium under better control and 4.0.  Discontinue Veltassa.   LOS: 4 Leslie Houston 3/8/20211:03 PM  [facility] [place], [place]

## 2019-07-06 NOTE — Progress Notes (Signed)
Progress Note  Patient Name: Leslie Houston Date of Encounter: 07/06/2019  Primary Cardiologist: New to [facility] - consult by [place]  Subjective   No chest pain, dyspnea, palpitations, dizziness, presyncope, or syncope.   Inpatient Medications    Scheduled Meds: . aspirin EC  81 mg Oral Daily  . atorvastatin  10 mg Oral q1800  . Chlorhexidine Gluconate Cloth  6 each Topical Daily  . citalopram  20 mg Oral Daily  . epoetin (EPOGEN/PROCRIT) injection  10,000 Units Subcutaneous Weekly  . furosemide  40 mg Oral Daily  . levETIRAcetam  500 mg Oral BID  . patiromer  16.8 g Oral Daily  . sodium chloride flush  10 mL Intravenous Q12H   Continuous Infusions:  PRN Meds: acetaminophen **OR** acetaminophen, diclofenac sodium, fluticasone, ondansetron **OR** ondansetron (ZOFRAN) IV, senna-docusate   Vital Signs    Vitals:   07/05/19 1559 07/05/19 2004 07/06/19 0443 07/06/19 0810  BP: (!) 150/76 138/66 (!) 123/51 (!) 117/55  Pulse: (!) 53 (!) 57 (!) 59 (!) 58  Resp: 18   18  Temp: 98.2 F (36.8 C) 98.2 F (36.8 C) 98.2 F (36.8 C) 98.5 F (36.9 C)  TempSrc: Oral Oral Oral Oral  SpO2: 99% 99% 99% 97%  Weight:      Height:        Intake/Output Summary (Last 24 hours) at 07/06/2019 0917 Last data filed at 07/06/2019 0447 Gross per 24 hour  Intake --  Output 3500 ml  Net -3500 ml   Filed Weights   07/02/19 1500 07/03/19 0449 07/04/19 0527  Weight: 98.7 kg 98 kg 97.6 kg    Telemetry    SR with 1st degree AV block, 4.1 second pause at 11:30 on 3/7, none since - Personally Reviewed  ECG    No new tracings - Personally Reviewed  Physical Exam   GEN: No acute distress.   Neck: No JVD. Cardiac: RRR, no murmurs, rubs, or gallops.  Respiratory: Clear to auscultation bilaterally.  GI: Soft, nontender, non-distended.   MS: No edema; No deformity. Neuro:  Alert and oriented x 3; Nonfocal.  Psych: Normal affect.  Labs    Chemistry Recent Labs  Lab 07/02/19 1103  07/02/19 1103 07/03/19 0755 07/04/19 0606 07/05/19 0513  NA 138   < > 142 140 139  K 5.2*   < > 5.7* 4.6 4.5  CL 114*   < > 117* 112* 115*  CO2 17*   < > 21* 17* 17*  GLUCOSE 87   < > 87 79 85  BUN 53*   < > 55* 60* 55*  CREATININE 3.49*   < > 3.95* 4.06* 3.99*  CALCIUM 9.3   < > 9.4 9.8 9.6  PROT 7.4  --   --   --   --   ALBUMIN 4.1  --   --   --   --   AST 23  --   --   --   --   ALT 26  --   --   --   --   ALKPHOS 100  --   --   --   --   BILITOT 0.6  --   --   --   --   GFRNONAA 13*   < > 11* 10* 11*  GFRAA 15*   < > 13* 12* 12*  ANIONGAP 7   < > 4* 11 7   < > = values in this interval not displayed.  Hematology Recent Labs  Lab 07/02/19 1103 07/03/19 0755 07/05/19 0513  WBC 4.5 3.8* 4.2  RBC 2.47* 2.31* 2.19*  HGB 8.7* 8.4* 7.9*  HCT 26.1* 25.0* 24.1*  MCV 105.7* 108.2* 110.0*  MCH 35.2* 36.4* 36.1*  MCHC 33.3 33.6 32.8  RDW 13.3 13.3 13.2  PLT 136* 129* 124*    Cardiac EnzymesNo results for input(s): TROPONINI in the last 168 hours. No results for input(s): TROPIPOC in the last 168 hours.   BNP Recent Labs  Lab 07/02/19 1117  BNP 180.0*     DDimer No results for input(s): DDIMER in the last 168 hours.   Radiology    US RENAL  Result Date: 07/04/2019 IMPRESSION: 1. Chronically dilated bladder with marked bilateral hydronephrosis. 2. Diffuse bilateral renal cortical thinning and mildly increased echogenicity, most likely due to chronic bilateral hydronephrosis and mild medical renal disease. Electronically Signed   By: Claudie Revering M.D.   On: 07/04/2019 11:24    Cardiac Studies   2D echo 07/03/2019: 1. Left ventricular ejection fraction, by estimation, is 50 to 55%. The  left ventricle has low normal function. The left ventricle has no regional  wall motion abnormalities. Left ventricular diastolic parameters are  consistent with Grade II diastolic dysfunction (pseudonormalization).  2. Right ventricular systolic function is normal. The right  ventricular  size is normal. Tricuspid regurgitation signal is inadequate for assessing  PA pressure.  3. Left atrial size was mildly dilated.  4. The mitral valve is degenerative. Mild mitral valve regurgitation. No  evidence of mitral stenosis.  5. The aortic valve is tricuspid. Aortic valve regurgitation is not  visualized. No aortic stenosis is present.  6. The inferior vena cava is normal in size with <50% respiratory  variability, suggesting right atrial pressure of 8 mmHg.   Patient Profile     69 y.o. female with history of CVA in 05/2014, recurrent syncope, CKD stage II, carotid artery stenosis, DM2, PUD, HTN, orthostatic hypotension, and obesity who we are seeing for bradycardia and swelling.  Assessment & Plan    1. Sinus pauses/bradycardia: -Asymptomatic with her pauses which appeared to be during sleeping hours -Evaluated by EP with pauses preceded by as needed RR lengthening likely due to a vagal reaction -At this point, EP does not feel pacing is indicated -She needs an outpatient sleep study -Avoid beta blockers, non-dihydropyridine calcium channel blockers and clonidine   2. Acute renal failure on CKD stage II: -Creatinine slightly improved 3/7 and pending this morning -Continue to hold olmesartan -Nephrology has been consulted with renal ultrasound showing bilateral hydronephrosis s/p Foley  3. Anemia: -Hemoglobin decreased to 7.9   -CBC pending this morning -Plan per primary team  4. Dyspnea/lower extremity swelling: -Breathing improved -Continues to have some degree of volume overload -PO Lasix per nephrology  5. Morbid obesity: -Weight loss advised -Will need outpatient sleep study  6. HTN: -Avoiding beta blockers, non-dihydropyridine calcium channel blockers, clonidine, ACEi/ARB in the setting of acute renal failure and bradycardia -If needed, could use hydralazine  7. Hyperkalemia: -Improved s/p Veltassa -Has occurred in the setting of  acute renal failure  8. Prior CVA: -Continue aspirin and Lipitor  For questions or updates, please contact [place] Please consult [email or web address] for contact info under Cardiology/STEMI.    Signed, Christell Faith, PA-C [place] Pager: [phone] 07/06/2019, 9:17 AM

## 2019-07-07 DIAGNOSIS — R0602 Shortness of breath: Secondary | ICD-10-CM | POA: Diagnosis not present

## 2019-07-07 DIAGNOSIS — R5381 Other malaise: Secondary | ICD-10-CM

## 2019-07-07 DIAGNOSIS — I509 Heart failure, unspecified: Secondary | ICD-10-CM | POA: Diagnosis not present

## 2019-07-07 DIAGNOSIS — D638 Anemia in other chronic diseases classified elsewhere: Secondary | ICD-10-CM | POA: Diagnosis not present

## 2019-07-07 DIAGNOSIS — R001 Bradycardia, unspecified: Secondary | ICD-10-CM | POA: Diagnosis not present

## 2019-07-07 DIAGNOSIS — E875 Hyperkalemia: Secondary | ICD-10-CM | POA: Diagnosis not present

## 2019-07-07 DIAGNOSIS — N179 Acute kidney failure, unspecified: Secondary | ICD-10-CM | POA: Diagnosis not present

## 2019-07-07 LAB — BASIC METABOLIC PANEL
Anion gap: 11 (ref 5–15)
BUN: 50 mg/dL — ABNORMAL HIGH (ref 8–23)
CO2: 19 mmol/L — ABNORMAL LOW (ref 22–32)
Calcium: 9 mg/dL (ref 8.9–10.3)
Chloride: 111 mmol/L (ref 98–111)
Creatinine, Ser: 3.46 mg/dL — ABNORMAL HIGH (ref 0.44–1.00)
GFR calc Af Amer: 15 mL/min — ABNORMAL LOW (ref >60)
GFR calc non Af Amer: 13 mL/min — ABNORMAL LOW (ref >60)
Glucose, Bld: 88 mg/dL (ref 70–99)
Potassium: 3.7 mmol/L (ref 3.5–5.1)
Sodium: 141 mmol/L (ref 135–145)

## 2019-07-07 LAB — CBC WITH DIFFERENTIAL/PLATELET
Abs Immature Granulocytes: 0.02 10*3/uL (ref 0.00–0.07)
Basophils Absolute: 0 10*3/uL (ref 0.0–0.1)
Basophils Relative: 0 %
Eosinophils Absolute: 0.1 10*3/uL (ref 0.0–0.5)
Eosinophils Relative: 3 %
HCT: 27.5 % — ABNORMAL LOW (ref 36.0–46.0)
Hemoglobin: 8.8 g/dL — ABNORMAL LOW (ref 12.0–15.0)
Immature Granulocytes: 1 %
Lymphocytes Relative: 18 %
Lymphs Abs: 0.7 10*3/uL (ref 0.7–4.0)
MCH: 33.6 pg (ref 26.0–34.0)
MCHC: 32 g/dL (ref 30.0–36.0)
MCV: 105 fL — ABNORMAL HIGH (ref 80.0–100.0)
Monocytes Absolute: 0.4 10*3/uL (ref 0.1–1.0)
Monocytes Relative: 11 %
Neutro Abs: 2.8 10*3/uL (ref 1.7–7.7)
Neutrophils Relative %: 67 %
Platelets: 135 10*3/uL — ABNORMAL LOW (ref 150–400)
RBC: 2.62 MIL/uL — ABNORMAL LOW (ref 3.87–5.11)
RDW: 13.5 % (ref 11.5–15.5)
WBC: 4.1 10*3/uL (ref 4.0–10.5)
nRBC: 0 % (ref 0.0–0.2)

## 2019-07-07 MED ORDER — LEVETIRACETAM 500 MG PO TABS: 500.0000 mg | ORAL_TABLET | Freq: Two times a day (BID) | ORAL | Status: DC

## 2019-07-07 MED ORDER — FUROSEMIDE 40 MG PO TABS: 40.0000 mg | ORAL_TABLET | Freq: Every day | ORAL | 0 refills | Status: DC

## 2019-07-07 MED ORDER — DICLOFENAC SODIUM 1 % TD GEL: 4.0000 g | Freq: Four times a day (QID) | TRANSDERMAL | Status: DC | PRN

## 2019-07-07 NOTE — Progress Notes (Signed)
[facility], [place] 07/07/19  Subjective:   LOS: 5 03/08 0701 - 03/09 0700 In: 1610 [P.O.:1198] Out: 2750 [Urine:2750]  Renal function improving.  Cr 3.46, BUN 50, good UOP noted.  Foley in place.    Objective:  Vital signs in last 24 hours:  Temp:  [97.5 F (36.4 C)-98.7 F (37.1 C)] 97.5 F (36.4 C) (03/09 1131) Pulse Rate:  [54-62] 54 (03/09 1131) Resp:  [18-20] 20 (03/09 0428) BP: (97-137)/(54-73) 137/61 (03/09 1131) SpO2:  [95 %-98 %] 97 % (03/09 1131) Weight:  [89.5 kg] 89.5 kg (03/09 0428)  Weight change:  Filed Weights   07/03/19 0449 07/04/19 0527 07/07/19 0428  Weight: 98 kg 97.6 kg 89.5 kg    Intake/Output:    Intake/Output Summary (Last 24 hours) at 07/07/2019 1349 Last data filed at 07/07/2019 0454 Gross per 24 hour  Intake 718 ml  Output 2350 ml  Net -1632 ml     Physical Exam: General:  Chronically ill-appearing, laying in the bed  HEENT  moist oral mucous membranes  Pulm/lungs  clear bilateral, normal effort  CVS/Heart  irregular  Abdomen:   Soft, mildly distended, obese  Extremities:  1+ pitting edema  Neurologic:  Alert, able to answer questions appropriately  Skin:  Warm, dry  Access:  None       Basic Metabolic Panel:  Recent Labs  Lab 07/03/19 0755 07/03/19 0755 07/04/19 0606 07/04/19 0606 07/05/19 0513 07/06/19 0930 07/07/19 0838  NA 142  --  140  --  139 138 141  K 5.7*  --  4.6  --  4.5 4.0 3.7  CL 117*  --  112*  --  115* 114* 111  CO2 21*  --  17*  --  17* 19* 19*  GLUCOSE 87  --  79  --  85 88 88  BUN 55*  --  60*  --  55* 52* 50*  CREATININE 3.95*  --  4.06*  --  3.99* 3.76* 3.46*  CALCIUM 9.4   < > 9.8   < > 9.6 8.9 9.0   < > = values in this interval not displayed.     CBC: Recent Labs  Lab 07/02/19 1103 07/03/19 0755 07/05/19 0513 07/06/19 0930 07/07/19 0838  WBC 4.5 3.8* 4.2 4.4 4.1  NEUTROABS 3.4  --  3.1 3.3 2.8  HGB 8.7* 8.4* 7.9* 8.2* 8.8*  HCT 26.1* 25.0* 24.1*  24.4* 27.5*  MCV 105.7* 108.2* 110.0* 109.9* 105.0*  PLT 136* 129* 124* 134* 135*     No results found for: HEPBSAG, HEPBSAB, HEPBIGM    Microbiology:  Recent Results (from the past 240 hour(s))  Respiratory Panel by RT PCR (Flu A&B, Covid) - Nasopharyngeal Swab     Status: None   Collection Time: 07/02/19 11:46 AM   Specimen: Nasopharyngeal Swab  Result Value Ref Range Status   SARS Coronavirus 2 by RT PCR NEGATIVE NEGATIVE Final    Comment: (NOTE) SARS-CoV-2 target nucleic acids are NOT DETECTED. The SARS-CoV-2 RNA is generally detectable in upper respiratoy specimens during the acute phase of infection. The lowest concentration of SARS-CoV-2 viral copies this assay can detect is 131 copies/mL. A negative result does not preclude SARS-Cov-2 infection and should not be used as the sole basis for treatment or other patient management decisions. A negative result may occur with  improper specimen collection/handling, submission of specimen other than nasopharyngeal swab, presence of viral mutation(s) within the areas targeted by this assay, and inadequate  number of viral copies (<131 copies/mL). A negative result must be combined with clinical observations, patient history, and epidemiological information. The expected result is Negative. Fact Sheet for Patients:  PinkCheek.be Fact Sheet for Healthcare Providers:  GravelBags.it This test is not yet ap proved or cleared by the [place] FDA and  has been authorized for detection and/or diagnosis of SARS-CoV-2 by FDA under an Emergency Use Authorization (EUA). This EUA will remain  in effect (meaning this test can be used) for the duration of the COVID-19 declaration under Section 564(b)(1) of the Act, 21 U.S.C. section 360bbb-3(b)(1), unless the authorization is terminated or revoked sooner.    Influenza A by PCR NEGATIVE NEGATIVE Final   Influenza B by PCR  NEGATIVE NEGATIVE Final    Comment: (NOTE) The Xpert Xpress SARS-CoV-2/FLU/RSV assay is intended as an aid in  the diagnosis of influenza from Nasopharyngeal swab specimens and  should not be used as a sole basis for treatment. Nasal washings and  aspirates are unacceptable for Xpert Xpress SARS-CoV-2/FLU/RSV  testing. Fact Sheet for Patients: PinkCheek.be Fact Sheet for Healthcare Providers: GravelBags.it This test is not yet approved or cleared by the [place] FDA and  has been authorized for detection and/or diagnosis of SARS-CoV-2 by  FDA under an Emergency Use Authorization (EUA). This EUA will remain  in effect (meaning this test can be used) for the duration of the  Covid-19 declaration under Section 564(b)(1) of the Act, 21  U.S.C. section 360bbb-3(b)(1), unless the authorization is  terminated or revoked. Performed at [facility], [address] [place], [place] [address]   Urine culture     Status: None   Collection Time: 07/02/19 11:48 AM   Specimen: Urine, Random  Result Value Ref Range Status   Specimen Description   Final    URINE, RANDOM Performed at [facility], [address], [place], [place] [address]    Special Requests   Final    NONE Performed at [facility], [address] [place], [place] [address]    Culture   Final    NO GROWTH Performed at [facility], [place] [address] [place], [place] [address]    Report Status 07/03/2019 FINAL  Final    Coagulation Studies: No results for input(s): LABPROT, INR in the last 72 hours.  Urinalysis: No results for input(s): COLORURINE, LABSPEC, PHURINE, GLUCOSEU, HGBUR, BILIRUBINUR, KETONESUR, PROTEINUR, UROBILINOGEN, NITRITE, LEUKOCYTESUR in the last 72 hours.  Invalid input(s): APPERANCEUR    Imaging: No results found.   Medications:    . aspirin EC  81 mg Oral Daily  . atorvastatin  10 mg Oral  q1800  . Chlorhexidine Gluconate Cloth  6 each Topical Daily  . citalopram  20 mg Oral Daily  . epoetin (EPOGEN/PROCRIT) injection  10,000 Units Subcutaneous Weekly  . furosemide  40 mg Oral Daily  . heparin injection (subcutaneous)  5,000 Units Subcutaneous Q8H  . levETIRAcetam  500 mg Oral BID  . sodium chloride flush  10 mL Intravenous Q12H   acetaminophen **OR** acetaminophen, diclofenac sodium, fluticasone, ondansetron **OR** ondansetron (ZOFRAN) IV, senna-docusate  Assessment/ Plan:  69 y.o. female with HTN, Carotid stenosis, previous h/o hydronephrosis, seizure, stroke, anxiety, CKD admitted with following issues  Principal Problem:   AKI (acute kidney injury) ([place]) Active Problems:   Essential hypertension   GERD   Seizures ([place])   Anemia of chronic disease   Bradycardia   Hyperkalemia   Debility   #. AKI on  CKD st 3B with bilateral hydronephrosis. Recent Labs    07/04/19 0606 07/05/19 0513 07/06/19 0930 07/07/19 0838  CREATININE 4.06* 3.99* 3.76* 3.46*  Baseline Creatinine from 01/07/2019 is 1.59/GFR 38 Progressively worsening since then Screening auto immune serologies, SPEP, UPEP  neg from 04/17/2019 Mildly elevated k/l ration likely secondary to CKD Renal ultrasound revealed bilateral hydronephrosis.  -Renal function continues to improve, Cr currently 3.46.  Good UOP noted.  Keep foley in place until pt can see urology.     #. Anemia of CKD  Lab Results  Component Value Date   HGB 8.8 (L) 07/07/2019  Continue Epogen 10,000 units subcutaneous weekly.  #. HTN with lower extremity edema Continue furosemide 40 mg daily.  #Hyperkalemia K down to 3.7.  Off of veltassa.    LOS: 5 Dsean Vantol 3/9/20211:49 PM  [facility], [place]

## 2019-07-07 NOTE — Progress Notes (Signed)
Progress Note  Patient Name: Leslie [place] Date of Encounter: 07/07/2019  Primary Cardiologist: New to [facility] - consult by [place]  Subjective   No chest pain, dyspnea, palpitations, dizziness, presyncope, or syncope. No further pauses noted on telemetry. She has demonstrated appropriate chronotropic response.   Inpatient Medications    Scheduled Meds: . aspirin EC  81 mg Oral Daily  . atorvastatin  10 mg Oral q1800  . Chlorhexidine Gluconate Cloth  6 each Topical Daily  . citalopram  20 mg Oral Daily  . epoetin (EPOGEN/PROCRIT) injection  10,000 Units Subcutaneous Weekly  . furosemide  40 mg Oral Daily  . heparin injection (subcutaneous)  5,000 Units Subcutaneous Q8H  . levETIRAcetam  500 mg Oral BID  . sodium chloride flush  10 mL Intravenous Q12H   Continuous Infusions:  PRN Meds: acetaminophen **OR** acetaminophen, diclofenac sodium, fluticasone, ondansetron **OR** ondansetron (ZOFRAN) IV, senna-docusate   Vital Signs    Vitals:   07/06/19 1154 07/06/19 1630 07/06/19 1935 07/07/19 0428  BP: (!) 127/56 (!) 128/55 (!) 137/58 133/62  Pulse: (!) 55 (!) 56 (!) 59 62  Resp: 18 18 20 20   Temp: 98.3 F (36.8 C) 98.5 F (36.9 C) 98.7 F (37.1 C) 98 F (36.7 C)  TempSrc: Oral Oral Oral Oral  SpO2: 97% 98% 97% 95%  Weight:    89.5 kg  Height:        Intake/Output Summary (Last 24 hours) at 07/07/2019 0655 Last data filed at 07/07/2019 0454 Gross per 24 hour  Intake 1198 ml  Output 2750 ml  Net -1552 ml   Filed Weights   07/03/19 0449 07/04/19 0527 07/07/19 0428  Weight: 98 kg 97.6 kg 89.5 kg    Telemetry    Sinus bradycardia/SR with 1st degree AV block, no further pauses - Personally Reviewed  ECG    No new tracings - Personally Reviewed  Physical Exam   GEN: No acute distress.   Neck: No JVD. Cardiac: Bradycardic, no murmurs, rubs, or gallops.  Respiratory: Clear to auscultation bilaterally.  GI: Soft, nontender, non-distended.   MS: No edema; No  deformity. Neuro:  Alert and oriented x 3; Nonfocal.  Psych: Normal affect.  Labs    Chemistry Recent Labs  Lab 07/02/19 1103 07/03/19 0755 07/04/19 0606 07/05/19 0513 07/06/19 0930  NA 138   < > 140 139 138  K 5.2*   < > 4.6 4.5 4.0  CL 114*   < > 112* 115* 114*  CO2 17*   < > 17* 17* 19*  GLUCOSE 87   < > 79 85 88  BUN 53*   < > 60* 55* 52*  CREATININE 3.49*   < > 4.06* 3.99* 3.76*  CALCIUM 9.3   < > 9.8 9.6 8.9  PROT 7.4  --   --   --   --   ALBUMIN 4.1  --   --   --   --   AST 23  --   --   --   --   ALT 26  --   --   --   --   ALKPHOS 100  --   --   --   --   BILITOT 0.6  --   --   --   --   GFRNONAA 13*   < > 10* 11* 12*  GFRAA 15*   < > 12* 12* 13*  ANIONGAP 7   < > 11 7 5    < > =  values in this interval not displayed.     Hematology Recent Labs  Lab 07/03/19 0755 07/05/19 0513 07/06/19 0930  WBC 3.8* 4.2 4.4  RBC 2.31* 2.19* 2.22*  HGB 8.4* 7.9* 8.2*  HCT 25.0* 24.1* 24.4*  MCV 108.2* 110.0* 109.9*  MCH 36.4* 36.1* 36.9*  MCHC 33.6 32.8 33.6  RDW 13.3 13.2 13.4  PLT 129* 124* 134*    Cardiac EnzymesNo results for input(s): TROPONINI in the last 168 hours. No results for input(s): TROPIPOC in the last 168 hours.   BNP Recent Labs  Lab 07/02/19 1117  BNP 180.0*     DDimer No results for input(s): DDIMER in the last 168 hours.   Radiology    US RENAL  Result Date: 07/04/2019 IMPRESSION: 1. Chronically dilated bladder with marked bilateral hydronephrosis. 2. Diffuse bilateral renal cortical thinning and mildly increased echogenicity, most likely due to chronic bilateral hydronephrosis and mild medical renal disease. Electronically Signed   By: Claudie Revering M.D.   On: 07/04/2019 11:24    Cardiac Studies   2D echo 07/03/2019: 1. Left ventricular ejection fraction, by estimation, is 50 to 55%. The  left ventricle has low normal function. The left ventricle has no regional  wall motion abnormalities. Left ventricular diastolic parameters are    consistent with Grade II diastolic dysfunction (pseudonormalization).  2. Right ventricular systolic function is normal. The right ventricular  size is normal. Tricuspid regurgitation signal is inadequate for assessing  PA pressure.  3. Left atrial size was mildly dilated.  4. The mitral valve is degenerative. Mild mitral valve regurgitation. No  evidence of mitral stenosis.  5. The aortic valve is tricuspid. Aortic valve regurgitation is not  visualized. No aortic stenosis is present.  6. The inferior vena cava is normal in size with <50% respiratory  variability, suggesting right atrial pressure of 8 mmHg.   Patient Profile     69 y.o. female with history of CVA in 05/2014, recurrent syncope, CKD stage II, carotid artery stenosis, DM2, PUD, HTN, orthostatic hypotension, and obesity who we are seeing for bradycardia and swelling.  Assessment & Plan    1. Sinus pauses/bradycardia: -No further pauses -She has demonstrated appropriate chronotropic response -Asymptomatic with her pauses which appeared to be during sleeping hours -Evaluated by EP with pause likely due to vagal reaction with no indication for pacing at this time per EP -She needs an outpatient sleep study -Avoid beta blockers, non-dihydropyridine calcium channel blockers and clonidine   2. Acute renal failure on CKD stage II: -Creatinine slightly improved 3/8 and pending this morning -Good UOP -Remains off olmesartan -Nephrology has been consulted with renal ultrasound showing bilateral hydronephrosis s/p Foley -No indication for dialysis at this time  3. Anemia: -Hemoglobin low though stable -CBC pending this morning -Epogen -Plan per primary team  4. Dyspnea/lower extremity swelling: -Breathing improved -Continues to have some degree of volume overload -PO Lasix per nephrology  5. Morbid obesity: -Weight loss advised -Will need outpatient sleep study  6. HTN: -Avoiding beta blockers,  non-dihydropyridine calcium channel blockers, clonidine, ACEi/ARB in the setting of acute renal failure and bradycardia -If needed, could use hydralazine  7. Hyperkalemia: -Improved s/p Veltassa -Has occurred in the setting of acute renal failure  8. Prior CVA: -Continue aspirin and Lipitor  For questions or updates, please contact [place] Please consult [email or web address] for contact info under Cardiology/STEMI.    Signed, Christell Faith, PA-C [place] Pager: [phone] 07/07/2019, 6:55 AM

## 2019-07-07 NOTE — Discharge Summary (Addendum)
Physician Discharge Summary  Leslie Houston VFI:[id number] DOB: 1950-05-07 DOA: 07/02/2019  PCP: Hubbard Hartshorn, FNP  Admit date: 07/02/2019 Discharge date: 07/07/2019  Discharge disposition: Home with home health PT and OT   Recommendations for Outpatient Follow-Up:   Outpatient follow-up with nephrologist in 1 week Outpatient follow-up with cardiologist in 1 month Outpatient follow-up with urologist in 2 weeks Outpatient follow-up for sleep study   Discharge Diagnosis:   Principal Problem:   AKI (acute kidney injury) ([place]) Active Problems:   Essential hypertension   GERD   Seizures ([place])   Anemia of chronic disease   Bradycardia   Hyperkalemia   Debility    Discharge Condition: Stable.  Diet recommendation: Renal diet  Code status: Full code.    Hospital Course:   Leslie Houston is an 69 y.o. 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 She isalso noted to have worsening of herrenal function from her baseline and her creatinine has gone from 1.3in 2019>>3.49 with aGFR of 15.78ml/min.She had 2 pillow orthopnea but did not have any chest pain,nausea,vomiting,diaphoresis,palpitations oranychanges in her bowel habits.Patient also has a 3 g drop in her H&H from 11.8g/dl to 8.7g/dl andpotassium was elevated.  She was diagnosed with acute kidney injury, hyperkalemia and acute  exacerbation of chronic diastolic CHF.  Hyperkalemia was successfully treated with Veltassa.  She was treated with IV Lasix and subsequently transitioned to oral Lasix.  She was also found to have sinus bradycardia with sinus pauses after about 5 to 7 seconds on telemetry.  However, she was asymptomatic from bradycardia.  She was evaluated by cardiologist, electrophysiologist and nephrologist during her stay in the hospital.  Cardiologist and electrophysiologist said she was not a candidate for permanent pacemaker.  She was also found to have acute urinary retention and severe bilateral hydronephrosis.  Foley catheter could not be placed by the nursing staff.  Urologist was consulted for placement of Foley catheter and he recommended that Foley catheter be kept in place for 10 to 14 days.  Her condition has improved and she is deemed stable for discharge to home.  From nephrology standpoint, patient can be discharged home with outpatient follow-up.  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  Discharge plan was discussed with [name], her caregiver, at the bedside, and her daughter, [name], over the phone.     Discharge Exam:   Vitals:   07/07/19 1131 07/07/19 1620  BP: 137/61 (!) 130/50  Pulse: (!) 54 (!) 54  Resp:    Temp: (!) 97.5 F (36.4 C) (!) 97.5 F (36.4 C)  SpO2: 97% 95%   Vitals:   07/07/19 0809 07/07/19 0929 07/07/19 1131 07/07/19 1620  BP: 97/73 (!) 123/54 137/61 (!) 130/50  Pulse: (!) 56 (!) 57 (!) 54 (!) 54  Resp:      Temp: 97.8 F (36.6 C)  (!) 97.5 F (36.4 C) (!) 97.5 F (36.4 C)  TempSrc: Oral  Oral Oral  SpO2: 97%  97% 95%  Weight:      Height:  GEN: NAD SKIN: No rash EYES: EOMI ENT: MMM CV: RRR PULM: CTA B ABD: soft, obese, NT, +BS CNS: AAO x 3, non focal EXT: No edema or tenderness   The results of significant diagnostics from this hospitalization (including imaging, microbiology, ancillary and laboratory) are listed below for  reference.     Procedures and Diagnostic Studies:   US RENAL  Result Date: 07/04/2019 CLINICAL DATA:  Acute kidney failure. EXAM: RENAL / URINARY TRACT ULTRASOUND COMPLETE COMPARISON:  Abdomen and pelvis CT dated 08/18/2014 FINDINGS: Right Kidney: Renal measurements: 11.4 x 7.8 x 6.8 cm = volume: 321 mL. Mildly echogenic with diffuse cortical thinning. Marked dilatation of the renal collecting system. Left Kidney: Renal measurements: 11.2 x 6.9 x 5.4 cm = volume: 218 mL. Mildly echogenic with diffuse cortical thinning. Marked dilatation of the renal collecting system. Bladder: Dilated bladder with no visible mass. The calculated bladder volume is 1650 cc. Other: None. IMPRESSION: 1. Chronically dilated bladder with marked bilateral hydronephrosis. 2. Diffuse bilateral renal cortical thinning and mildly increased echogenicity, most likely due to chronic bilateral hydronephrosis and mild medical renal disease. Electronically Signed   By: Claudie Revering M.D.   On: 07/04/2019 11:24   ECHOCARDIOGRAM COMPLETE  Result Date: 07/03/2019    ECHOCARDIOGRAM REPORT   Patient Name:   Leslie Houston Date of Exam: 07/03/2019 Medical Rec #:  [id number]        Height:       61.0 in Accession #:    [id number]       Weight:       216.0 lb Date of Birth:  April 06, 1951       BSA:          1.952 m Patient Age:    69 years         BP:           146/50 mmHg Patient Gender: F                HR:           51 bpm. Exam Location:  [facility] Procedure: 2D Echo, Color Doppler and Cardiac Doppler Indications:     I50.31 CHF-Acute Diastolic  History:         Patient has no prior history of Echocardiogram examinations.                  CHF, CAD, Stroke and PVD, CKD; Risk Factors:Hypertension,                  Dyslipidemia and Diabetes.  Sonographer:     Charmayne Sheer RDCS (AE) Referring Phys:  [id number] Collier Bullock Diagnosing Phys: Harrell Gave End MD  Sonographer Comments: Suboptimal parasternal window and suboptimal subcostal window. IMPRESSIONS  1.  Left ventricular ejection fraction, by estimation, is 50 to 55%. The left ventricle has low normal function. The left ventricle has no regional wall motion abnormalities. Left ventricular diastolic parameters are consistent with Grade II diastolic dysfunction (pseudonormalization).  2. Right ventricular systolic function is normal. The right ventricular size is normal. Tricuspid regurgitation signal is inadequate for assessing PA pressure.  3. Left atrial size was mildly dilated.  4. The mitral valve is degenerative. Mild mitral valve regurgitation. No evidence of mitral stenosis.  5. The aortic valve is tricuspid. Aortic valve regurgitation is not visualized. No aortic stenosis is present.  6. The inferior vena cava is normal in size with <50% respiratory variability, suggesting right atrial pressure of 8 mmHg. FINDINGS  Left Ventricle:  Left ventricular ejection fraction, by estimation, is 50 to 55%. The left ventricle has low normal function. The left ventricle has no regional wall motion abnormalities. The left ventricular internal cavity size was normal in size. There is no left ventricular hypertrophy. Left ventricular diastolic parameters are consistent with Grade II diastolic dysfunction (pseudonormalization). Right Ventricle: The right ventricular size is normal. No increase in right ventricular wall thickness. Right ventricular systolic function is normal. Tricuspid regurgitation signal is inadequate for assessing PA pressure. Left Atrium: Left atrial size was mildly dilated. Right Atrium: Right atrial size was normal in size. Pericardium: There is no evidence of pericardial effusion. Mitral Valve: The mitral valve is degenerative in appearance. There is mild thickening of the mitral valve leaflet(s). Mild mitral annular calcification. Mild mitral valve regurgitation. No evidence of mitral valve stenosis. MV peak gradient, 5.0 mmHg. The mean mitral valve gradient is 3.0 mmHg. Tricuspid Valve: The tricuspid  valve is not well visualized. Tricuspid valve regurgitation is mild. Aortic Valve: The aortic valve is tricuspid. Aortic valve regurgitation is not visualized. No aortic stenosis is present. There is mild calcification of the aortic valve. Aortic valve mean gradient measures 5.0 mmHg. Aortic valve peak gradient measures 12.7 mmHg. Aortic valve area, by VTI measures 2.05 cm. Pulmonic Valve: The pulmonic valve was not well visualized. Pulmonic valve regurgitation is not visualized. No evidence of pulmonic stenosis. Aorta: The aortic root is normal in size and structure. Pulmonary Artery: The pulmonary artery is not well seen. Venous: The inferior vena cava is normal in size with less than 50% respiratory variability, suggesting right atrial pressure of 8 mmHg. IAS/Shunts: The interatrial septum was not well visualized.  LEFT VENTRICLE PLAX 2D LVIDd:         4.67 cm     Diastology LVIDs:         3.69 cm     LV e' lateral:   8.49 cm/s LV PW:         1.00 cm     LV E/e' lateral: 11.3 LV IVS:        0.64 cm     LV e' medial:    7.94 cm/s LVOT diam:     1.90 cm     LV E/e' medial:  12.0 LV SV:         86 LV SV Index:   44 LVOT Area:     2.84 cm  LV Volumes (MOD) LV vol d, MOD A2C: 73.1 ml LV vol d, MOD A4C: 89.0 ml LV vol s, MOD A2C: 36.4 ml LV vol s, MOD A4C: 44.7 ml LV SV MOD A2C:     36.7 ml LV SV MOD A4C:     89.0 ml LV SV MOD BP:      44.5 ml RIGHT VENTRICLE RV Basal diam:  3.31 cm LEFT ATRIUM             Index       RIGHT ATRIUM           Index LA diam:        3.90 cm 2.00 cm/m  RA Area:     14.70 cm LA Vol (A2C):   47.9 ml 24.54 ml/m RA Volume:   35.30 ml  18.09 ml/m LA Vol (A4C):   79.1 ml 40.53 ml/m LA Biplane Vol: 68.5 ml 35.10 ml/m  AORTIC VALVE                    PULMONIC VALVE AV  Area (Vmax):    2.28 cm     PV Vmax:       1.03 m/s AV Area (Vmean):   2.05 cm     PV Vmean:      71.600 cm/s AV Area (VTI):     2.05 cm     PV VTI:        0.236 m AV Vmax:           178.00 cm/s  PV Peak grad:  4.2 mmHg AV  Vmean:          109.000 cm/s PV Mean grad:  2.0 mmHg AV VTI:            0.421 m AV Peak Grad:      12.7 mmHg AV Mean Grad:      5.0 mmHg LVOT Vmax:         143.00 cm/s LVOT Vmean:        79.000 cm/s LVOT VTI:          0.305 m LVOT/AV VTI ratio: 0.72  AORTA Ao Root diam: 3.00 cm MITRAL VALVE MV Area (PHT): 2.60 cm    SHUNTS MV Peak grad:  5.0 mmHg    Systemic VTI:  0.30 m MV Mean grad:  3.0 mmHg    Systemic Diam: 1.90 cm MV Vmax:       1.12 m/s MV Vmean:      76.0 cm/s MV Decel Time: 292 msec MV E velocity: 95.55 cm/s MV A velocity: 82.00 cm/s MV E/A ratio:  1.17 Christopher End MD Electronically signed by Nelva Bush MD Signature Date/Time: 07/03/2019/1:52:19 PM    Final      Labs:   Basic Metabolic Panel: Recent Labs  Lab 07/03/19 0755 07/03/19 0755 07/04/19 0606 07/04/19 0606 07/05/19 0513 07/05/19 0513 07/06/19 0930 07/07/19 0838  NA 142  --  140  --  139  --  138 141  K 5.7*   < > 4.6   < > 4.5   < > 4.0 3.7  CL 117*  --  112*  --  115*  --  114* 111  CO2 21*  --  17*  --  17*  --  19* 19*  GLUCOSE 87  --  79  --  85  --  88 88  BUN 55*  --  60*  --  55*  --  52* 50*  CREATININE 3.95*  --  4.06*  --  3.99*  --  3.76* 3.46*  CALCIUM 9.4  --  9.8  --  9.6  --  8.9 9.0   < > = values in this interval not displayed.   GFR Estimated Creatinine Clearance: 15.4 mL/min (A) (by C-G formula based on SCr of 3.46 mg/dL (H)). Liver Function Tests: Recent Labs  Lab 07/02/19 1103  AST 23  ALT 26  ALKPHOS 100  BILITOT 0.6  PROT 7.4  ALBUMIN 4.1   No results for input(s): LIPASE, AMYLASE in the last 168 hours. No results for input(s): AMMONIA in the last 168 hours. Coagulation profile No results for input(s): INR, PROTIME in the last 168 hours.  CBC: Recent Labs  Lab 07/02/19 1103 07/03/19 0755 07/05/19 0513 07/06/19 0930 07/07/19 0838  WBC 4.5 3.8* 4.2 4.4 4.1  NEUTROABS 3.4  --  3.1 3.3 2.8  HGB 8.7* 8.4* 7.9* 8.2* 8.8*  HCT 26.1* 25.0* 24.1* 24.4* 27.5*  MCV 105.7*  108.2* 110.0* 109.9* 105.0*  PLT 136* 129* 124* 134* 135*  Cardiac Enzymes: No results for input(s): CKTOTAL, CKMB, CKMBINDEX, TROPONINI in the last 168 hours. BNP: Invalid input(s): POCBNP CBG: Recent Labs  Lab 07/06/19 1936  GLUCAP 88   D-Dimer No results for input(s): DDIMER in the last 72 hours. Hgb A1c No results for input(s): HGBA1C in the last 72 hours. Lipid Profile No results for input(s): CHOL, HDL, LDLCALC, TRIG, CHOLHDL, LDLDIRECT in the last 72 hours. Thyroid function studies No results for input(s): TSH, T4TOTAL, T3FREE, THYROIDAB in the last 72 hours.  Invalid input(s): FREET3 Anemia work up No results for input(s): VITAMINB12, FOLATE, FERRITIN, TIBC, IRON, RETICCTPCT in the last 72 hours. Microbiology Recent Results (from the past 240 hour(s))  Respiratory Panel by RT PCR (Flu A&B, Covid) - Nasopharyngeal Swab     Status: None   Collection Time: 07/02/19 11:46 AM   Specimen: Nasopharyngeal Swab  Result Value Ref Range Status   SARS Coronavirus 2 by RT PCR NEGATIVE NEGATIVE Final    Comment: (NOTE) SARS-CoV-2 target nucleic acids are NOT DETECTED. The SARS-CoV-2 RNA is generally detectable in upper respiratoy specimens during the acute phase of infection. The lowest concentration of SARS-CoV-2 viral copies this assay can detect is 131 copies/mL. A negative result does not preclude SARS-Cov-2 infection and should not be used as the sole basis for treatment or other patient management decisions. A negative result may occur with  improper specimen collection/handling, submission of specimen other than nasopharyngeal swab, presence of viral mutation(s) within the areas targeted by this assay, and inadequate number of viral copies (<131 copies/mL). A negative result must be combined with clinical observations, patient history, and epidemiological information. The expected result is Negative. Fact Sheet for Patients:   PinkCheek.be Fact Sheet for Healthcare Providers:  GravelBags.it This test is not yet ap proved or cleared by the [place] FDA and  has been authorized for detection and/or diagnosis of SARS-CoV-2 by FDA under an Emergency Use Authorization (EUA). This EUA will remain  in effect (meaning this test can be used) for the duration of the COVID-19 declaration under Section 564(b)(1) of the Act, 21 U.S.C. section 360bbb-3(b)(1), unless the authorization is terminated or revoked sooner.    Influenza A by PCR NEGATIVE NEGATIVE Final   Influenza B by PCR NEGATIVE NEGATIVE Final    Comment: (NOTE) The Xpert Xpress SARS-CoV-2/FLU/RSV assay is intended as an aid in  the diagnosis of influenza from Nasopharyngeal swab specimens and  should not be used as a sole basis for treatment. Nasal washings and  aspirates are unacceptable for Xpert Xpress SARS-CoV-2/FLU/RSV  testing. Fact Sheet for Patients: PinkCheek.be Fact Sheet for Healthcare Providers: GravelBags.it This test is not yet approved or cleared by the [place] FDA and  has been authorized for detection and/or diagnosis of SARS-CoV-2 by  FDA under an Emergency Use Authorization (EUA). This EUA will remain  in effect (meaning this test can be used) for the duration of the  Covid-19 declaration under Section 564(b)(1) of the Act, 21  U.S.C. section 360bbb-3(b)(1), unless the authorization is  terminated or revoked. Performed at [facility], [address], [place], [place] [address]   Urine culture     Status: None   Collection Time: 07/02/19 11:48 AM   Specimen: Urine, Random  Result Value Ref Range Status   Specimen Description   Final    URINE, RANDOM Performed at [facility], [address] [place], [place] [address]    Special Requests   Final    NONE Performed  at [facility], [address], [place], [place] [address]    Culture   Final    NO GROWTH Performed at [facility], [place] [address], [place], [place] [address]    Report Status 07/03/2019 FINAL  Final     Discharge Instructions:   Discharge Instructions    AMB referral to CHF clinic   Complete by: As directed    Amb Referral to Cardiac Rehabilitation   Complete by: As directed    Diagnosis: Heart Failure (see criteria below if ordering Phase II)   Heart Failure Type: Chronic Systolic & Diastolic   After initial evaluation and assessments completed: Virtual Based Care may be provided alone or in conjunction with Phase 2 Cardiac Rehab based on patient barriers.: Yes   Diet renal 60/70-06-01-1198   Complete by: As directed    Discharge instructions   Complete by: As directed    Keep Foley catheter until you see urologist   Discharge instructions   Complete by: As directed    Follow-up with PCP to arrange for outpatient sleep study to check for obstructive sleep apnea.   Face-to-face encounter (required for Medicare/Medicaid patients)   Complete by: As directed    I Taggart Prasad certify that this patient is under my care and that I, or a nurse practitioner or physician's assistant working with me, had a face-to-face encounter that meets the physician face-to-face encounter requirements with this patient on 07/07/2019. The encounter with the patient was in whole, or in part for the following medical condition(s) which is the primary reason for home health care (List medical condition): CHF, Debility   The encounter with the patient was in whole, or in part, for the following medical condition, which is the primary reason for home health care: CHF, Debility   I certify that, based on my findings, the following services are medically necessary home health services: Physical therapy   Reason for Medically Necessary Home Health Services: Therapy- Instruction on use of Assistive Device for  Ambulation on all Surfaces   My clinical findings support the need for the above services: Unable to leave home safely without assistance and/or assistive device   Further, I certify that my clinical findings support that this patient is homebound due to: Unable to leave home safely without assistance   Home Health   Complete by: As directed    To provide the following care/treatments:  PT OT     Increase activity slowly   Complete by: As directed    Walker rolling   Complete by: As directed    Polysomnography 4 or more parameters   Complete by: Jul 20, 2019    Where should this test be performed: [place]     Allergies as of 07/07/2019      Reactions   Penicillin G Itching   Atorvastatin Other (See Comments)   Myalgias    Other Hives   Adhesive tape      Medication List    STOP taking these medications   olmesartan-hydrochlorothiazide 40-12.5 MG tablet Commonly known as: BENICAR HCT     TAKE these medications   amLODipine 5 MG tablet Commonly known as: NORVASC Take 5 mg by mouth daily.   aspirin 81 MG EC tablet Commonly known as: Aspir-Low Take 1 tablet (81 mg total) by mouth daily. Swallow whole.   citalopram 20 MG tablet Commonly known as: CELEXA Take 1 tablet once daily What changed:   how much to take  how  to take this  when to take this   diclofenac sodium 1 % Gel Commonly known as: VOLTAREN Apply 4 g topically 4 (four) times daily as needed.   ezetimibe 10 MG tablet Commonly known as: ZETIA Take 10 mg by mouth daily.   fluticasone 50 MCG/ACT nasal spray Commonly known as: FLONASE Place 2 sprays into both nostrils daily as needed for allergies (allergies).   furosemide 40 MG tablet Commonly known as: LASIX Take 1 tablet (40 mg total) by mouth daily. Start taking on: July 08, 2019   levETIRAcetam 500 MG tablet Commonly known as: KEPPRA Take 1-2 tablets (500-1,000 mg total) by mouth 2 (two) times daily. Take one tablet (500 mg) in the  morning, one and one-half tablets (750 mg)  at night What changed:   how much to take  how to take this  when to take this  additional instructions            Durable Medical Equipment  (From admission, onward)         Start     Ordered   07/07/19 1150  For home use only DME Walker rolling  Once    Question Answer Comment  Walker: With [place]   Patient needs a walker to treat with the following condition Weakness      07/07/19 1150         Follow-up Information    [facility] On 07/10/2019.   Specialty: Cardiology Why: at 9:30am. Enter through the [place] entrance Contact information: [place] [phone]       Minna Merritts, MD On 08/10/2019.   Specialty: Cardiology Why: Appopintment at 1:40pm Contact information: [place] [address] [phone]        Murlean Iba, MD. Schedule an appointment as soon as possible for a visit on 07/13/2019.   Specialty: Nephrology Why: appointment at [facility] information: [place] [address] [phone]        Abbie Sons, MD. Schedule an appointment as soon as possible for a visit in 2 week(s).   Specialty: Urology Contact information: [place] [address] [phone]            Time coordinating discharge: 32 minutes  Signed:  Jennye Boroughs  [facility] 07/07/2019, 5:01 PM

## 2019-07-09 ENCOUNTER — Telehealth: Payer: Self-pay | Admitting: Family

## 2019-07-09 ENCOUNTER — Telehealth: Payer: Self-pay | Admitting: Family Medicine

## 2019-07-09 NOTE — Progress Notes (Signed)
Patient ID: Leslie Houston, female    DOB: 1951/01/09, 69 y.o.   MRN: [id number]  HPI  Ms Buzzelli is a 69 y/o female with a history of CAD, DM, hyperlipidemia, HTN, CKD, stroke, PUD, gout, seizures, carotid disease, previous tobacco use and chronic heart failure.   Echo report from 07/03/19 reviewed and showed an EF of 50-55% along with mild MR.  Admitted 07/02/19 due to acute HF exacerbation and hyperkalemia along with AKI. Urology and cardiology consults obtained. Initially needed IV lasix and transitioned to oral diuretics. Was given veltessa for hyperkalemia. Foley catheter placed due to severe bilateral hydronephrosis & urinary retention. Discharged after 5 days.   She presents today for her initial visit with a chief complaint of minimal fatigue upon moderate exertion. She describes this as chronic in nature having been present for several years but is much improved from her recent admission. She has associated rhinorrhea along with this. She denies any difficulty sleeping, dizziness, abdominal distention, palpitations, pedal edema, chest pain, shortness of breath, cough or weight gain (although scale has recently broke)  She says that she's waiting on an appointment regarding a sleep study as she snores and has apneic episodes.   Past Medical History:  Diagnosis Date  . CAD (coronary artery disease)    a. Pt reports in 2000 she had a stent to a coronary artery, details unclear, outside hospital.  . Carotid stenosis    a. CT angio head 07/2014 - 50-60% stenoses of prox ICA bilaterally.  . CHF (congestive heart failure) ([place])    a. Pt reports in 2000 she had CHF, details unclear, outside hospital.  . CKD (chronic kidney disease), stage III   . Diabetes mellitus ([place])   . Family hx of colon cancer 06/13/2018   Father diagnosed in his 71's  . Gout   . Hyperlipidemia   . Hypertension   . Orthostatic hypotension   . Osteopenia 01/24/2018   DEXA Sept 2019  . PUD (peptic ulcer disease)    . PVC's (premature ventricular contractions)   . Seizures ([place])   . Sinus bradycardia   . Stroke [facility])    a. L PCA CVA in 05/2014.  [name] Syncope    Past Surgical History:  Procedure Laterality Date  . CESAREAN SECTION    . DENTAL SURGERY    . Heart stent    . stint     Family History  Problem Relation Age of Onset  . Stroke Mother   . Heart attack Mother   . Lung cancer Mother   . Stroke Father   . Prostate cancer Father   . Colon cancer Father   . Diabetes Mellitus II Daughter   . Multiple sclerosis Daughter   . Kidney disease Son   . Heart failure Son   . Cancer Paternal Grandfather    Social History   Tobacco Use  . Smoking status: Former Smoker    Quit date: 03/30/2014    Years since quitting: 5.2  . Smokeless tobacco: Never Used  Substance Use Topics  . Alcohol use: No   Allergies  Allergen Reactions  . Penicillin G Itching  . Atorvastatin Other (See Comments)    Myalgias   . Other Hives    Adhesive tape   Prior to Admission medications   Medication Sig Start Date End Date Taking? Authorizing Provider  amLODipine (NORVASC) 5 MG tablet Take 5 mg by mouth daily. 06/11/19  Yes [provider]  aspirin (ASPIR-LOW) 81 MG EC tablet  Take 1 tablet (81 mg total) by mouth daily. Swallow whole. 07/03/18  Yes Suzzanne Cloud, NP  citalopram (CELEXA) 20 MG tablet Take 1 tablet once daily Patient taking differently: Take 20 mg by mouth daily. Take 1 tablet once daily 01/12/19  Yes Hubbard Hartshorn, FNP  diclofenac sodium (VOLTAREN) 1 % GEL Apply 4 g topically 4 (four) times daily as needed. 07/07/19  Yes Jennye Boroughs, MD  ezetimibe (ZETIA) 10 MG tablet Take 10 mg by mouth daily. 06/11/19  Yes [provider]  fluticasone (FLONASE) 50 MCG/ACT nasal spray Place 2 sprays into both nostrils daily as needed for allergies (allergies).    Yes [provider]  furosemide (LASIX) 40 MG tablet Take 1 tablet (40 mg total) by mouth daily. 07/08/19  Yes Jennye Boroughs,  MD  levETIRAcetam (KEPPRA) 500 MG tablet Take 1-2 tablets (500-1,000 mg total) by mouth 2 (two) times daily. Take one tablet (500 mg) in the morning, one and one-half tablets (750 mg)  at night 07/07/19  Yes Jennye Boroughs, MD     Review of Systems  Constitutional: Positive for fatigue (very little). Negative for appetite change.  HENT: Positive for rhinorrhea. Negative for congestion and sore throat.   Eyes: Negative.   Respiratory: Negative for cough and shortness of breath.   Cardiovascular: Negative for chest pain, palpitations and leg swelling.  Gastrointestinal: Negative for abdominal distention and abdominal pain.  Endocrine: Negative.   Genitourinary: Negative.        Foley catheter present  Musculoskeletal: Negative for back pain and neck pain.  Allergic/Immunologic: Negative.   Neurological: Negative for dizziness and light-headedness.  Hematological: Negative for adenopathy. Does not bruise/bleed easily.  Psychiatric/Behavioral: Negative for dysphoric mood and sleep disturbance (sleeping on 4 pillow). The patient is not nervous/anxious.    Vitals:   07/10/19 0931  BP: (!) 142/63  Pulse: 70  Resp: 18  SpO2: 96%  Weight: 189 lb (85.7 kg)  Height: 5\' 1"  (1.549 m)   Wt Readings from Last 3 Encounters:  07/10/19 189 lb (85.7 kg)  07/07/19 197 lb 6.4 oz (89.5 kg)  01/29/19 200 lb 8 oz (90.9 kg)   Lab Results  Component Value Date   CREATININE 3.46 (H) 07/07/2019   CREATININE 3.76 (H) 07/06/2019   CREATININE 3.99 (H) 07/05/2019    Physical Exam Vitals and nursing note reviewed. Exam conducted with a chaperone present.  Constitutional:      Appearance: Normal appearance.  HENT:     Head: Normocephalic and atraumatic.  Cardiovascular:     Rate and Rhythm: Normal rate and regular rhythm.  Pulmonary:     Effort: Pulmonary effort is normal. No respiratory distress.     Breath sounds: No wheezing or rales.  Abdominal:     General: Abdomen is flat. There is no  distension.     Palpations: Abdomen is soft.  Musculoskeletal:        General: No tenderness.     Cervical back: Normal range of motion and neck supple.     Right lower leg: Edema (2+ pitting) present.     Left lower leg: Edema (2+ pitting) present.  Skin:    General: Skin is warm and dry.  Neurological:     General: No focal deficit present.     Mental Status: She is alert and oriented to person, place, and time.  Psychiatric:        Mood and Affect: Mood normal.        Behavior:  Behavior normal.        Thought Content: Thought content normal.    Assessment & Plan:  1: Chronic heart failure with preserved ejection fraction- - NYHA class II - euvolemic today - was weighing but her scale broke; scales were given to her today and she was reminded to call for an overnight weight gain of >2 pounds or a weekly weight gain of >5 pounds - is now using Mrs. Dash or NoSalt for seasoning; low sodium cookbook and dietary information was given to patient - has appt with cardiology Rockey Situ) 08/10/19 - receiving [facility] with PT - BNP 07/02/19 was 180.0  2: HTN- - BP looks good today - saw PCP Uvaldo Rising) 01/29/2019 - BMP 07/07/19 reviewed and showed sodium 141, potassium 3.7, creatinine 3.46 and GFR 15  3: DM with CKD stage IV- - saw nephrology Candiss Norse) 04/17/2019 & returns 07/13/19 - A1c 01/07/2019 was 4.8%  4: Lymphedema- - stage 2 - not wearing compression socks and the friend that is present with her says that she will get her some; encouraged her to put them on every morning with removal at bedtime - elevates her legs some but not all the time; explained the importance of elevating her legs when sitting for long periods of time - limited in her ability to exercise due to her stroke - discussed lymphapress compression boots if edema persists after above therapies; brochure was given to patient  Medication bottles were reviewed.   Return in 6 weeks or sooner for any  questions/problems before then.

## 2019-07-09 NOTE — Telephone Encounter (Signed)
Spoke with patient and she confirmed her new patient chf clinic appt. She stated she is doing well with no complaints and is taking her meds as she was prescribed. She is checking her weight daily and following a [place] sodium diet.    Leslie Houston, [place]

## 2019-07-09 NOTE — Telephone Encounter (Signed)
Leslie Houston from [facility] call requesting PT verbal orders. 2x4 1x3 Leslie Houston states can leave VM if no answer Call back [phone]

## 2019-07-10 ENCOUNTER — Encounter: Payer: Self-pay | Admitting: Family

## 2019-07-10 ENCOUNTER — Other Ambulatory Visit: Payer: Self-pay

## 2019-07-10 ENCOUNTER — Ambulatory Visit: Payer: Medicare Other | Attending: Family | Admitting: Family

## 2019-07-10 VITALS — BP 142/63 | HR 70 | Resp 18 | Ht 61.0 in | Wt 189.0 lb

## 2019-07-10 DIAGNOSIS — I10 Essential (primary) hypertension: Secondary | ICD-10-CM

## 2019-07-10 DIAGNOSIS — Z79899 Other long term (current) drug therapy: Secondary | ICD-10-CM | POA: Diagnosis not present

## 2019-07-10 DIAGNOSIS — Z8249 Family history of ischemic heart disease and other diseases of the circulatory system: Secondary | ICD-10-CM | POA: Diagnosis not present

## 2019-07-10 DIAGNOSIS — Z87891 Personal history of nicotine dependence: Secondary | ICD-10-CM | POA: Diagnosis not present

## 2019-07-10 DIAGNOSIS — Z7982 Long term (current) use of aspirin: Secondary | ICD-10-CM | POA: Insufficient documentation

## 2019-07-10 DIAGNOSIS — J3489 Other specified disorders of nose and nasal sinuses: Secondary | ICD-10-CM | POA: Diagnosis not present

## 2019-07-10 DIAGNOSIS — I890 Lymphedema, not elsewhere classified: Secondary | ICD-10-CM | POA: Diagnosis not present

## 2019-07-10 DIAGNOSIS — E875 Hyperkalemia: Secondary | ICD-10-CM | POA: Insufficient documentation

## 2019-07-10 DIAGNOSIS — R5383 Other fatigue: Secondary | ICD-10-CM | POA: Insufficient documentation

## 2019-07-10 DIAGNOSIS — E1122 Type 2 diabetes mellitus with diabetic chronic kidney disease: Secondary | ICD-10-CM

## 2019-07-10 DIAGNOSIS — Z8673 Personal history of transient ischemic attack (TIA), and cerebral infarction without residual deficits: Secondary | ICD-10-CM | POA: Diagnosis not present

## 2019-07-10 DIAGNOSIS — N184 Chronic kidney disease, stage 4 (severe): Secondary | ICD-10-CM | POA: Diagnosis not present

## 2019-07-10 DIAGNOSIS — Z955 Presence of coronary angioplasty implant and graft: Secondary | ICD-10-CM | POA: Diagnosis not present

## 2019-07-10 DIAGNOSIS — I5032 Chronic diastolic (congestive) heart failure: Secondary | ICD-10-CM | POA: Diagnosis not present

## 2019-07-10 DIAGNOSIS — R569 Unspecified convulsions: Secondary | ICD-10-CM | POA: Insufficient documentation

## 2019-07-10 DIAGNOSIS — R0683 Snoring: Secondary | ICD-10-CM | POA: Insufficient documentation

## 2019-07-10 DIAGNOSIS — I130 Hypertensive heart and chronic kidney disease with heart failure and stage 1 through stage 4 chronic kidney disease, or unspecified chronic kidney disease: Secondary | ICD-10-CM | POA: Diagnosis not present

## 2019-07-10 DIAGNOSIS — M8580 Other specified disorders of bone density and structure, unspecified site: Secondary | ICD-10-CM | POA: Insufficient documentation

## 2019-07-10 DIAGNOSIS — E785 Hyperlipidemia, unspecified: Secondary | ICD-10-CM | POA: Insufficient documentation

## 2019-07-10 DIAGNOSIS — Z7901 Long term (current) use of anticoagulants: Secondary | ICD-10-CM | POA: Insufficient documentation

## 2019-07-10 DIAGNOSIS — I2510 Atherosclerotic heart disease of native coronary artery without angina pectoris: Secondary | ICD-10-CM | POA: Insufficient documentation

## 2019-07-10 NOTE — Patient Instructions (Signed)
Continue weighing daily and call for an overnight weight gain of > 2 pounds or a weekly weight gain of >5 pounds. 

## 2019-07-13 ENCOUNTER — Ambulatory Visit: Payer: Medicare Other | Admitting: Physician Assistant

## 2019-07-13 ENCOUNTER — Ambulatory Visit (INDEPENDENT_AMBULATORY_CARE_PROVIDER_SITE_OTHER): Payer: Medicare Other | Admitting: Physician Assistant

## 2019-07-13 ENCOUNTER — Other Ambulatory Visit: Payer: Self-pay

## 2019-07-13 VITALS — BP 138/73 | HR 60 | Ht 61.0 in | Wt 188.0 lb

## 2019-07-13 DIAGNOSIS — R339 Retention of urine, unspecified: Secondary | ICD-10-CM

## 2019-07-13 LAB — BLADDER SCAN AMB NON-IMAGING: Scan Result: 195

## 2019-07-13 MED ORDER — TAMSULOSIN HCL 0.4 MG PO CAPS: 0.4000 mg | ORAL_CAPSULE | Freq: Every day | ORAL | 0 refills | Status: DC

## 2019-07-13 NOTE — Progress Notes (Signed)
Fill and Pull Catheter Removal  Patient is present today for a catheter removal.  Patient was cleaned and prepped in a sterile fashion 164ml of sterile saline was instilled into the bladder when the patient felt the urge to urinate. 60ml of water was then drained from the balloon.  A 10CH silicone foley cath was removed from the bladder no complications were noted .  Patient as then given some time to void on their own.  Patient can void  148ml on their own after some time.  Patient tolerated well.  Performed by: Debroah Loop, PA-C   Follow up/ Additional notes: Push fluids and RTC this afternoon for PVR.

## 2019-07-13 NOTE — Telephone Encounter (Signed)
Jeani Hawking with [facility] called for an update on the verbal orders request. Jeani Hawking stated they would like approval for verbal orders asap so patient doe not miss any visits.Cb# [phone]
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
## 2019-07-14 ENCOUNTER — Telehealth: Payer: Self-pay | Admitting: Family Medicine

## 2019-07-14 NOTE — Telephone Encounter (Signed)
Home Health Verbal Orders - Caller/Agency: Izora Gala Wilson// [facility] Callback Number: [phone]  [id number] OT/PT/Skilled Nursing/Social Work/Speech Therapy: OT Frequency: 2x for 1wk and 1x for 3wks

## 2019-07-14 NOTE — Telephone Encounter (Signed)
Leslie Houston with [place] calling again to check on these orders.  States that pt is now going to miss out on care if they can not get a verbal.  Would like a call back ASAP.

## 2019-07-14 NOTE — Telephone Encounter (Signed)
Verbal order called into [facility] with [place] on today.

## 2019-07-15 ENCOUNTER — Other Ambulatory Visit: Payer: Self-pay

## 2019-07-15 ENCOUNTER — Ambulatory Visit (INDEPENDENT_AMBULATORY_CARE_PROVIDER_SITE_OTHER): Payer: Medicare Other | Admitting: Neurology

## 2019-07-15 ENCOUNTER — Encounter: Payer: Self-pay | Admitting: Neurology

## 2019-07-15 VITALS — BP 138/71 | HR 60 | Temp 97.3°F | Ht 67.0 in | Wt 190.2 lb

## 2019-07-15 DIAGNOSIS — I639 Cerebral infarction, unspecified: Secondary | ICD-10-CM | POA: Diagnosis not present

## 2019-07-15 DIAGNOSIS — R569 Unspecified convulsions: Secondary | ICD-10-CM | POA: Diagnosis not present

## 2019-07-15 MED ORDER — LEVETIRACETAM 500 MG PO TABS: ORAL_TABLET | ORAL | 3 refills | Status: DC

## 2019-07-15 NOTE — Patient Instructions (Signed)
Continue taking the Keppra I will send you for sleep evaluation  See you back in 6 months

## 2019-07-15 NOTE — Telephone Encounter (Signed)
Called and left vm approving verbal orders for OT.
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
## 2019-07-16 ENCOUNTER — Ambulatory Visit: Payer: Self-pay | Admitting: Physician Assistant

## 2019-07-17 ENCOUNTER — Ambulatory Visit (INDEPENDENT_AMBULATORY_CARE_PROVIDER_SITE_OTHER): Payer: Medicare Other | Admitting: Physician Assistant

## 2019-07-17 ENCOUNTER — Other Ambulatory Visit: Payer: Self-pay

## 2019-07-17 ENCOUNTER — Encounter: Payer: Self-pay | Admitting: Physician Assistant

## 2019-07-17 VITALS — BP 118/80 | HR 61 | Ht 67.0 in | Wt 186.0 lb

## 2019-07-17 DIAGNOSIS — R339 Retention of urine, unspecified: Secondary | ICD-10-CM | POA: Diagnosis not present

## 2019-07-17 LAB — BLADDER SCAN AMB NON-IMAGING
Scan Result: 125
Scan Result: 271

## 2019-07-17 MED ORDER — TAMSULOSIN HCL 0.4 MG PO CAPS: 0.4000 mg | ORAL_CAPSULE | Freq: Every day | ORAL | 5 refills | Status: DC
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
## 2019-07-17 NOTE — Progress Notes (Signed)
I have reviewed and agreed above plan. 

## 2019-07-17 NOTE — Patient Instructions (Signed)
Continue to take Flomax every day. I refilled this medication for you today and will plan to see you again in six months.  Call my office at [phone] immediately or go to the Emergency Department if you have any of these signs of urinary retention:  Needing to pee but feeling like you can't empty your bladder  Not being able to pee at all OR only being able to pee small amounts or drips  Going to the bathroom very often  Pain in your lower belly  Bloating in your lower belly  Also, use Miralax as needed to make sure you poop on a regular basis.

## 2019-08-08 NOTE — Progress Notes (Deleted)
Cardiology Office Note  Date:  08/08/2019   ID:  Leslie Houston, DOB May 30, 1950, MRN [id number]  PCP:  Hubbard Hartshorn, FNP   No chief complaint on file.   HPI:  Leslie Houston is a 69 y.o. female with a hx of  chronic kidney disease,  seizure disorder,  prior stroke residual right-sided vision loss/hearing loss,  presenting to the hospital 06/2019 with worsening renal function, worsening leg swelling shortness of breath symptoms over 2 months   on diuretics as outpatient, has had no significant urination, legs continue to get more edematous Recent lab work creatinine 1.3 up to 3.49 with GFR of 15 Reports 2 pillow orthopnea, coughing Unable to walk very far without having to stop secondary to shortness of breath Legs feel very heavy and swollen, very uncomfortable Additional lab work showing BNP 180, hematocrit 26/hemoglobin 8.7 - troponin  Echocardiogram ordered, renal ultrasound ordered  On further discussion she reports significant family history of renal disease, several family members on dialysis  Reports having prior MI, details unavailable  Echocardiogram April 2016 normal LV function, mild to moderately dilated left atrium Grade 2 diastolic dysfunction at that time Echo was ordered for syncope  She denies any near-syncope or syncope recently, no orthostasis symptoms  Telemetry reviewed heart rate of 50, not on any rate controlling agents as outpatient    1. Sinus bradycardia Grossly appears to be asymptomatic, blood pressure stable with systolic pressures 323 and higher, up to 557 systolic -Review of telemetry tomorrow with ambulation to check for chronotropic competence TSH within reasonable range -No indication at this time for pacemaker  2.  Leg swelling, shortness of breath High suspicion for worsening renal failure Agree with holding amlodipine Chest x-ray with interstitial edema Echocardiogram pending, renal ultrasound pending -Currently on  Lasix IV twice daily, started making urine We will need to monitor renal function closely  3.  Acute on chronic renal failure GFR 15 recent Was on olmesartan HCTZ 40/12.5 mg as outpatient, this has been held Followed by nephrology, strong family history of kidney failure and numerous relatives on hemodialysis -Renal ultrasound pending -Unable to exclude cardiorenal syndrome, echo pending  4.  Anemia Likely secondary to renal disease Iron stable, guaiac stools  5.  Morbid obesity At this stage unable to ambulate well Discussion concerning lifestyle modification at a later date  6.  History of stroke  7.  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 -Options for medications include hydralazine, nitrates, Cardura, Would avoid clonidine, beta-blockers, calcium channel blockers, ACE inhibitors, ARB  PMH:   has a past medical history of CAD (coronary artery disease), Carotid stenosis, CHF (congestive heart failure) ([place]), CKD (chronic kidney disease), stage III, Diabetes mellitus ([place]), Family hx of colon cancer (06/13/2018), Gout, Hyperlipidemia, Hypertension, Orthostatic hypotension, Osteopenia (01/24/2018), PUD (peptic ulcer disease), PVC's (premature ventricular contractions), Seizures ([place]), Sinus bradycardia, Stroke ([place]), and Syncope.  PSH:    Past Surgical History:  Procedure Laterality Date  . CESAREAN SECTION    . DENTAL SURGERY    . Heart stent    . stint      Current Outpatient Medications  Medication Sig Dispense Refill  . amLODipine (NORVASC) 5 MG tablet Take 5 mg by mouth daily.    [name] aspirin (ASPIR-LOW) 81 MG EC tablet Take 1 tablet (81 mg total) by mouth daily. Swallow whole. 30 tablet 11  . citalopram (CELEXA) 20 MG tablet Take 1 tablet once daily (Patient taking differently: Take 20 mg by mouth daily. Take 1  tablet once daily) 90 tablet 1  . diclofenac sodium (VOLTAREN) 1 % GEL Apply 4 g topically 4 (four) times daily as needed.    . ezetimibe  (ZETIA) 10 MG tablet Take 10 mg by mouth daily.    . fluticasone (FLONASE) 50 MCG/ACT nasal spray Place 2 sprays into both nostrils daily as needed for allergies (allergies).     . furosemide (LASIX) 40 MG tablet Take 1 tablet (40 mg total) by mouth daily. 30 tablet 0  . levETIRAcetam (KEPPRA) 500 MG tablet Take 1 in the morning, take 2 at night 270 tablet 3  . tamsulosin (FLOMAX) 0.4 MG CAPS capsule Take 1 capsule (0.4 mg total) by mouth daily. 30 capsule 5   No current facility-administered medications for this visit.     Allergies:   Latex, Penicillin g, Atorvastatin, and Other   Social History:  The patient  reports that she quit smoking about 5 years ago. She has never used smokeless tobacco. She reports that she does not drink alcohol or use drugs.   Family History:   family history includes Cancer in her paternal grandfather; Colon cancer in her father; Diabetes Mellitus II in her daughter; Heart attack in her mother; Heart failure in her son; Kidney disease in her son; Lung cancer in her mother; Multiple sclerosis in her daughter; Prostate cancer in her father; Stroke in her father and mother.    Review of Systems: ROS   PHYSICAL EXAM: VS:  There were no vitals taken for this visit. , BMI There is no height or weight on file to calculate BMI. GEN: Well nourished, well developed, in no acute distress HEENT: normal Neck: no JVD, carotid bruits, or masses Cardiac: RRR; no murmurs, rubs, or gallops,no edema  Respiratory:  clear to auscultation bilaterally, normal work of breathing GI: soft, nontender, nondistended, + BS MS: no deformity or atrophy Skin: warm and dry, no rash Neuro:  Strength and sensation are intact Psych: euthymic mood, full affect    Recent Labs: 07/02/2019: ALT 26; B Natriuretic Peptide 180.0; TSH 2.892 07/07/2019: BUN 50; Creatinine, Ser 3.46; Hemoglobin 8.8; Platelets 135; Potassium 3.7; Sodium 141    Lipid Panel Lab Results  Component Value Date    CHOL 174 01/07/2019   HDL 62 01/07/2019   LDLCALC 99 01/07/2019   TRIG 51 01/07/2019      Wt Readings from Last 3 Encounters:  07/17/19 186 lb (84.4 kg)  07/15/19 190 lb 3.2 oz (86.3 kg)  07/13/19 188 lb (85.3 kg)       ASSESSMENT AND PLAN:  Problem List Items Addressed This Visit    None       Disposition:   F/U  12 months   Total encounter time more than 25 minutes  Greater than 50% was spent in counseling and coordination of care with the patient    Signed, Esmond Plants, M.D., Ph.D. [place], [place]

## 2019-08-10 ENCOUNTER — Ambulatory Visit: Payer: Medicare Other | Admitting: Cardiovascular Disease

## 2019-08-11 ENCOUNTER — Telehealth: Payer: Self-pay | Admitting: Neurology

## 2019-08-12 MED ORDER — LEVETIRACETAM 500 MG PO TABS: ORAL_TABLET | ORAL | 3 refills | Status: DC

## 2019-08-12 NOTE — Addendum Note (Signed)
Addended by: Brandon Melnick on: 08/12/2019 01:49 PM   Modules accepted: Orders

## 2019-08-12 NOTE — Telephone Encounter (Addendum)
Rep with [facility] called to inform new prescription was note received and would like to know if it can be refaxed for pts levETIRAcetam (KEPPRA) 500 MG tablet   Fax#[phone]

## 2019-08-15 NOTE — Progress Notes (Deleted)
Patient ID: Leslie Houston, female    DOB: 1951/02/13, 69 y.o.   MRN: [id number]  HPI  Leslie Houston is a 69 y/o female with a history of CAD, DM, hyperlipidemia, HTN, CKD, stroke, PUD, gout, seizures, carotid disease, previous tobacco use and chronic heart failure.   Echo report from 07/03/19 reviewed and showed an EF of 50-55% along with mild MR.  Admitted 07/02/19 due to acute HF exacerbation and hyperkalemia along with AKI. Urology and cardiology consults obtained. Initially needed IV lasix and transitioned to oral diuretics. Was given veltessa for hyperkalemia. Foley catheter placed due to severe bilateral hydronephrosis & urinary retention. Discharged after 5 days.   She presents today for a follow-up visit with a chief complaint of      Past Medical History:  Diagnosis Date  . CAD (coronary artery disease)    a. Pt reports in 2000 she had a stent to a coronary artery, details unclear, outside hospital.  . Carotid stenosis    a. CT angio head 07/2014 - 50-60% stenoses of prox ICA bilaterally.  . CHF (congestive heart failure) ([place])    a. Pt reports in 2000 she had CHF, details unclear, outside hospital.  . CKD (chronic kidney disease), stage III   . Diabetes mellitus ([place])   . Family hx of colon cancer 06/13/2018   Leslie Houston diagnosed in his 13's  . Gout   . Hyperlipidemia   . Hypertension   . Orthostatic hypotension   . Osteopenia 01/24/2018   DEXA Sept 2019  . PUD (peptic ulcer disease)   . PVC's (premature ventricular contractions)   . Seizures ([place])   . Sinus bradycardia   . Stroke [facility])    a. L PCA CVA in 05/2014.  [name] Syncope    Past Surgical History:  Procedure Laterality Date  . CESAREAN SECTION    . DENTAL SURGERY    . Heart stent    . stint     Family History  Problem Relation Age of Onset  . Stroke Leslie Houston   . Heart attack Leslie Houston   . Lung cancer Leslie Houston   . Stroke Leslie Houston   . Prostate cancer Leslie Houston   . Colon cancer Leslie Houston   . Diabetes Mellitus II Daughter   .  Multiple sclerosis Daughter   . Kidney disease Son   . Heart failure Son   . Cancer Paternal Grandfather    Social History   Tobacco Use  . Smoking status: Former Smoker    Quit date: 03/30/2014    Years since quitting: 5.3  . Smokeless tobacco: Never Used  Substance Use Topics  . Alcohol use: No   Allergies  Allergen Reactions  . Latex Rash  . Penicillin G Itching  . Atorvastatin Other (See Comments)    Myalgias   . Other Hives    Adhesive tape      Review of Systems  Constitutional: Positive for fatigue (very little). Negative for appetite change.  HENT: Positive for rhinorrhea. Negative for congestion and sore throat.   Eyes: Negative.   Respiratory: Negative for cough and shortness of breath.   Cardiovascular: Negative for chest pain, palpitations and leg swelling.  Gastrointestinal: Negative for abdominal distention and abdominal pain.  Endocrine: Negative.   Genitourinary: Negative.        Foley catheter present  Musculoskeletal: Negative for back pain and neck pain.  Allergic/Immunologic: Negative.   Neurological: Negative for dizziness and light-headedness.  Hematological: Negative for adenopathy. Does not bruise/bleed easily.  Psychiatric/Behavioral: Negative  for dysphoric mood and sleep disturbance (sleeping on 4 pillow). The patient is not nervous/anxious.      Physical Exam Vitals and nursing note reviewed. Exam conducted with a chaperone present.  Constitutional:      Appearance: Normal appearance.  HENT:     Head: Normocephalic and atraumatic.  Cardiovascular:     Rate and Rhythm: Normal rate and regular rhythm.  Pulmonary:     Effort: Pulmonary effort is normal. No respiratory distress.     Breath sounds: No wheezing or rales.  Abdominal:     General: Abdomen is flat. There is no distension.     Palpations: Abdomen is soft.  Musculoskeletal:        General: No tenderness.     Cervical back: Normal range of motion and neck supple.     Right  lower leg: Edema (2+ pitting) present.     Left lower leg: Edema (2+ pitting) present.  Skin:    General: Skin is warm and dry.  Neurological:     General: No focal deficit present.     Mental Status: She is alert and oriented to person, place, and time.  Psychiatric:        Mood and Affect: Mood normal.        Behavior: Behavior normal.        Thought Content: Thought content normal.    Assessment & Plan:  1: Chronic heart failure with preserved ejection fraction without structural changes- - NYHA class II - euvolemic today - was weighing but her scale broke; scales were given to her today and she was reminded to call for an overnight weight gain of >2 pounds or a weekly weight gain of >5 pounds - weight 189 from last visit here 6 weeks ago - is now using Mrs. Dash or NoSalt for seasoning; low sodium cookbook and dietary information was given to patient - has appt with cardiology Rockey Situ) 09/01/19 - receiving advanced home health with PT - BNP 07/02/19 was 180.0  2: HTN- - BP  - saw PCP Uvaldo Rising) 01/29/2019 - BMP 07/13/19 reviewed and showed sodium 140, potassium 5.0, creatinine 2.51 and GFR 22  3: DM with CKD stage IV- - saw nephrology Candiss Norse) 07/13/19 - A1c 01/07/2019 was 4.8%  4: Lymphedema- - stage 2 - not wearing compression socks and the friend that is present with her says that she will get her some; encouraged her to put them on every morning with removal at bedtime - elevates her legs some but not all the time; explained the importance of elevating her legs when sitting for long periods of time - limited in her ability to exercise due to her stroke - discussed lymphapress compression boots if edema persists after above therapies; brochure was given to patient   Medication bottles were reviewed.

## 2019-08-17 ENCOUNTER — Ambulatory Visit: Payer: Medicare Other | Admitting: Family

## 2019-08-19 ENCOUNTER — Other Ambulatory Visit: Payer: Self-pay | Admitting: Neurology

## 2019-08-26 NOTE — Progress Notes (Deleted)
Patient ID: Leslie Houston, female    DOB: [birth year], 69 y.o.   MRN: [id number]  HPI  Ms Golightly is a 69 y/o female with a history of CAD, DM, hyperlipidemia, HTN, CKD, stroke, PUD, gout, seizures, carotid disease, previous tobacco use and chronic heart failure.   Echo report from 07/03/19 reviewed and showed an EF of 50-55% along with mild MR.  Admitted 07/02/19 due to acute HF exacerbation and hyperkalemia along with AKI. Urology and cardiology consults obtained. Initially needed IV lasix and transitioned to oral diuretics. Was given veltessa for hyperkalemia. Foley catheter placed due to severe bilateral hydronephrosis & urinary retention. Discharged after 5 days.   She presents today for a follow-up visit with a chief complaint of      Past Medical History:  Diagnosis Date  . CAD (coronary artery disease)    a. Pt reports in 2000 she had a stent to a coronary artery, details unclear, outside hospital.  . Carotid stenosis    a. CT angio head 07/2014 - 50-60% stenoses of prox ICA bilaterally.  . CHF (congestive heart failure) ([place])    a. Pt reports in 2000 she had CHF, details unclear, outside hospital.  . CKD (chronic kidney disease), stage III   . Diabetes mellitus ([place])   . Family hx of colon cancer 06/13/2018   Father diagnosed in his 21's  . Gout   . Hyperlipidemia   . Hypertension   . Orthostatic hypotension   . Osteopenia 01/24/2018   DEXA Sept 2019  . PUD (peptic ulcer disease)   . PVC's (premature ventricular contractions)   . Seizures ([place])   . Sinus bradycardia   . Stroke [facility])    a. L PCA CVA in 05/2014.  [name] Syncope    Past Surgical History:  Procedure Laterality Date  . CESAREAN SECTION    . DENTAL SURGERY    . Heart stent    . stint     Family History  Problem Relation Age of Onset  . Stroke Mother   . Heart attack Mother   . Lung cancer Mother   . Stroke Father   . Prostate cancer Father   . Colon cancer Father   . Diabetes Mellitus II Daughter   .  Multiple sclerosis Daughter   . Kidney disease Son   . Heart failure Son   . Cancer Paternal Grandfather    Social History   Tobacco Use  . Smoking status: Former Smoker    Quit date: 03/30/2014    Years since quitting: 5.4  . Smokeless tobacco: Never Used  Substance Use Topics  . Alcohol use: No   Allergies  Allergen Reactions  . Latex Rash  . Penicillin G Itching  . Atorvastatin Other (See Comments)    Myalgias   . Other Hives    Adhesive tape      Review of Systems  Constitutional: Positive for fatigue (very little). Negative for appetite change.  HENT: Positive for rhinorrhea. Negative for congestion and sore throat.   Eyes: Negative.   Respiratory: Negative for cough and shortness of breath.   Cardiovascular: Negative for chest pain, palpitations and leg swelling.  Gastrointestinal: Negative for abdominal distention and abdominal pain.  Endocrine: Negative.   Genitourinary: Negative.        Foley catheter present  Musculoskeletal: Negative for back pain and neck pain.  Allergic/Immunologic: Negative.   Neurological: Negative for dizziness and light-headedness.  Hematological: Negative for adenopathy. Does not bruise/bleed easily.  Psychiatric/Behavioral: Negative  for dysphoric mood and sleep disturbance (sleeping on 4 pillow). The patient is not nervous/anxious.      Physical Exam Vitals and nursing note reviewed. Exam conducted with a chaperone present.  Constitutional:      Appearance: Normal appearance.  HENT:     Head: Normocephalic and atraumatic.  Cardiovascular:     Rate and Rhythm: Normal rate and regular rhythm.  Pulmonary:     Effort: Pulmonary effort is normal. No respiratory distress.     Breath sounds: No wheezing or rales.  Abdominal:     General: Abdomen is flat. There is no distension.     Palpations: Abdomen is soft.  Musculoskeletal:        General: No tenderness.     Cervical back: Normal range of motion and neck supple.     Right  lower leg: Edema (2+ pitting) present.     Left lower leg: Edema (2+ pitting) present.  Skin:    General: Skin is warm and dry.  Neurological:     General: No focal deficit present.     Mental Status: She is alert and oriented to person, place, and time.  Psychiatric:        Mood and Affect: Mood normal.        Behavior: Behavior normal.        Thought Content: Thought content normal.    Assessment & Plan:  1: Chronic heart failure with preserved ejection fraction without structural changes- - NYHA class II - euvolemic today - was weighing but her scale broke; scales were given to her today and she was reminded to call for an overnight weight gain of >2 pounds or a weekly weight gain of >5 pounds - weight 189 from last visit here 6 weeks ago - is now using Mrs. Dash or NoSalt for seasoning; low sodium cookbook and dietary information was given to patient - has appt with cardiology Rockey Situ) 09/01/19 - receiving advanced home health with PT - BNP 07/02/19 was 180.0  2: HTN- - BP  - saw PCP Uvaldo Rising) 01/29/2019 - BMP 07/13/19 reviewed and showed sodium 140, potassium 5.0, creatinine 2.51 and GFR 22  3: DM with CKD stage IV- - saw nephrology Candiss Norse) 07/13/19 - A1c 01/07/2019 was 4.8%  4: Lymphedema- - stage 2 - not wearing compression socks and the friend that is present with her says that she will get her some; encouraged her to put them on every morning with removal at bedtime - elevates her legs some but not all the time; explained the importance of elevating her legs when sitting for long periods of time - limited in her ability to exercise due to her stroke - discussed lymphapress compression boots if edema persists after above therapies; brochure was given to patient   Medication bottles were reviewed.

## 2019-08-27 ENCOUNTER — Ambulatory Visit: Payer: Medicare Other | Admitting: Family

## 2019-08-27 ENCOUNTER — Telehealth: Payer: Self-pay | Admitting: Family

## 2019-08-27 NOTE — Telephone Encounter (Signed)
Patient did not show for her [facility] Failure [facility] appointment on 08/27/19. Will attempt to reschedule.  

## 2019-08-31 NOTE — Progress Notes (Deleted)
NO SHOW

## 2019-09-01 ENCOUNTER — Ambulatory Visit: Payer: Medicare Other | Admitting: Cardiovascular Disease

## 2019-09-01 DIAGNOSIS — I890 Lymphedema, not elsewhere classified: Secondary | ICD-10-CM

## 2019-09-01 DIAGNOSIS — E1122 Type 2 diabetes mellitus with diabetic chronic kidney disease: Secondary | ICD-10-CM

## 2019-09-01 DIAGNOSIS — I6523 Occlusion and stenosis of bilateral carotid arteries: Secondary | ICD-10-CM

## 2019-09-01 DIAGNOSIS — I5032 Chronic diastolic (congestive) heart failure: Secondary | ICD-10-CM

## 2019-09-02 ENCOUNTER — Encounter: Payer: Self-pay | Admitting: Cardiovascular Disease

## 2019-10-12 ENCOUNTER — Telehealth: Payer: Self-pay | Admitting: Family Medicine

## 2019-10-12 NOTE — Telephone Encounter (Signed)
[employer] resending form on pt today and marking as urgent as states has sent couple times last week.

## 2019-10-13 NOTE — Telephone Encounter (Signed)
Left message for patient to call office to see if she ordered these supplies from this company

## 2019-10-16 ENCOUNTER — Telehealth: Payer: Self-pay

## 2019-10-16 NOTE — Telephone Encounter (Signed)
Copied from [place] [id number]. Topic: General - Other >> Oct 16, 2019  9:28 AM Marya Landry D wrote: Reason for CRM: Terrial Rhodes called from [employer] checking status of the forms that were sent over 10/07/2019, fax [phone]

## 2019-10-19 NOTE — Telephone Encounter (Signed)
Terrial Rhodes of [employer] is calling about forms that needs to be filled out. He is asking status on these and I could not seen . Please call  [phone] ASK FOR [place].

## 2019-10-19 NOTE — Telephone Encounter (Signed)
Spoke to patient and she stated she did not want any supplies. Do not send any orders

## 2019-10-21 NOTE — Telephone Encounter (Signed)
Leslie Houston with [facility] called for an update on the forms that were faxed on 10/07/19. Leslie Houston requests that the forms be faxed back today. Cb# [phone]

## 2019-10-21 NOTE — Telephone Encounter (Signed)
The patient refused the services Terrial Rhodes is offering. Per the patient she has been "harrassed" by [employer] constantly after refusing the care from [employer]. I will fax a form [place] the patient declines.

## 2019-10-29 ENCOUNTER — Other Ambulatory Visit: Payer: Self-pay | Admitting: Family Medicine

## 2019-10-29 DIAGNOSIS — G8929 Other chronic pain: Secondary | ICD-10-CM

## 2019-10-29 NOTE — Telephone Encounter (Signed)
Requested medication (s) are due for refill today:  Yes  Requested medication (s) are on the active medication list:  Yes  Future visit scheduled:  Yes - AWV  Last Refill: Historical Provider 07/07/19  Note to Clinic- pt. Was formerly a pt. Of Raelyn Ensign  Requested Prescriptions  Pending Prescriptions Disp Refills   diclofenac Sodium (VOLTAREN) 1 % GEL [Pharmacy Med Name: DICLOFENAC SODIUM 1% TOP GEL GM] 300 g     Sig: APPLY 4 GRAMS TOPICALLY 4 TIMES DAILY      Analgesics:  Topicals Passed - 10/29/2019  4:03 PM      Passed - Valid encounter within last 12 months    Recent Outpatient Visits           9 months ago Essential hypertension   [place], FNP   9 months ago Benign hypertension with CKD (chronic kidney disease) stage III [facility])   [facility] Steele Sizer, MD   9 months ago Dependent edema   [place], Sehili   11 months ago Essential hypertension   [place], NP   1 year ago Bilateral swelling of feet and ankles   [place], Bethel Born, NP       Future Appointments             In 2 months  [facility], [place]   In 2 months [facility], [place]

## 2019-12-10 ENCOUNTER — Telehealth: Payer: Self-pay

## 2019-12-10 NOTE — Telephone Encounter (Signed)
Pt needs appt for citalopram refill

## 2019-12-10 NOTE — Telephone Encounter (Signed)
LVM to schedule an appt

## 2019-12-23 ENCOUNTER — Telehealth: Payer: Self-pay | Admitting: Emergency Medicine

## 2019-12-23 NOTE — Telephone Encounter (Signed)
Spoke pt and she no longer need the appt. She has already gotten the moderma shot.

## 2019-12-23 NOTE — Telephone Encounter (Signed)
Copied from [place] [id number]. Topic: General - Inquiry >> Dec 14, 2019 11:09 AM Lennox Solders wrote: Reason for CRM: pt has chf and kidney issues and would like her provider recommendation to which vaccine she should get if she recommends her to get vaccine. Pt is concern about possible side effects. Please advice

## 2019-12-23 NOTE — Telephone Encounter (Signed)
Please schedule her appointment. Maybe virtual

## 2020-01-05 ENCOUNTER — Ambulatory Visit: Payer: Medicare Other

## 2020-01-18 ENCOUNTER — Ambulatory Visit: Payer: Medicare Other | Admitting: Physician Assistant

## 2020-01-20 ENCOUNTER — Encounter: Payer: Self-pay | Admitting: Neurology

## 2020-01-20 ENCOUNTER — Ambulatory Visit (INDEPENDENT_AMBULATORY_CARE_PROVIDER_SITE_OTHER): Payer: Medicare Other | Admitting: Neurology

## 2020-01-20 VITALS — BP 132/66 | HR 53 | Ht 67.0 in | Wt 180.4 lb

## 2020-01-20 DIAGNOSIS — R569 Unspecified convulsions: Secondary | ICD-10-CM | POA: Diagnosis not present

## 2020-01-20 MED ORDER — LEVETIRACETAM 500 MG PO TABS: ORAL_TABLET | ORAL | 3 refills | Status: DC
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
## 2020-01-20 NOTE — Patient Instructions (Signed)
Continue Keppra at current dosing Call for seizures activity  I will order sleep study  See you back in 1 year

## 2020-01-26 ENCOUNTER — Other Ambulatory Visit: Payer: Self-pay

## 2020-01-26 ENCOUNTER — Ambulatory Visit (INDEPENDENT_AMBULATORY_CARE_PROVIDER_SITE_OTHER): Payer: Medicare Other | Admitting: Physician Assistant

## 2020-01-26 ENCOUNTER — Encounter: Payer: Self-pay | Admitting: Physician Assistant

## 2020-01-26 VITALS — BP 134/81 | HR 67 | Ht 61.0 in | Wt 181.0 lb

## 2020-01-26 DIAGNOSIS — R339 Retention of urine, unspecified: Secondary | ICD-10-CM | POA: Diagnosis not present

## 2020-01-26 DIAGNOSIS — R93429 Abnormal radiologic findings on diagnostic imaging of unspecified kidney: Secondary | ICD-10-CM

## 2020-01-26 LAB — BLADDER SCAN AMB NON-IMAGING
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
## 2020-01-29 ENCOUNTER — Other Ambulatory Visit: Payer: Self-pay | Admitting: Physician Assistant

## 2020-01-29 DIAGNOSIS — R339 Retention of urine, unspecified: Secondary | ICD-10-CM

## 2020-02-16 ENCOUNTER — Telehealth: Payer: Self-pay | Admitting: Physician Assistant

## 2020-02-16 ENCOUNTER — Ambulatory Visit: Payer: Medicare Other

## 2020-02-16 NOTE — Telephone Encounter (Signed)
Radiology gave a courtesy call to inform you that the pt did not show for her ultrasound appt today - fyi

## 2020-02-18 ENCOUNTER — Encounter: Payer: Self-pay | Admitting: Neurology

## 2020-02-18 ENCOUNTER — Ambulatory Visit (INDEPENDENT_AMBULATORY_CARE_PROVIDER_SITE_OTHER): Payer: Medicare Other | Admitting: Neurology

## 2020-02-18 ENCOUNTER — Other Ambulatory Visit: Payer: Self-pay

## 2020-02-18 VITALS — BP 136/78 | HR 53 | Ht 61.0 in | Wt 185.0 lb

## 2020-02-18 DIAGNOSIS — I2510 Atherosclerotic heart disease of native coronary artery without angina pectoris: Secondary | ICD-10-CM | POA: Insufficient documentation

## 2020-02-18 DIAGNOSIS — F4312 Post-traumatic stress disorder, chronic: Secondary | ICD-10-CM | POA: Insufficient documentation

## 2020-02-18 DIAGNOSIS — G4710 Hypersomnia, unspecified: Secondary | ICD-10-CM | POA: Diagnosis not present

## 2020-02-18 DIAGNOSIS — R0683 Snoring: Secondary | ICD-10-CM

## 2020-02-18 DIAGNOSIS — F515 Nightmare disorder: Secondary | ICD-10-CM | POA: Diagnosis not present

## 2020-02-18 DIAGNOSIS — G4730 Sleep apnea, unspecified: Secondary | ICD-10-CM

## 2020-02-18 DIAGNOSIS — N1832 Chronic kidney disease, stage 3b: Secondary | ICD-10-CM

## 2020-02-18 DIAGNOSIS — I25118 Atherosclerotic heart disease of native coronary artery with other forms of angina pectoris: Secondary | ICD-10-CM | POA: Diagnosis not present

## 2020-02-18 DIAGNOSIS — G40209 Localization-related (focal) (partial) symptomatic epilepsy and epileptic syndromes with complex partial seizures, not intractable, without status epilepticus: Secondary | ICD-10-CM | POA: Insufficient documentation
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
## 2020-02-18 NOTE — Patient Instructions (Signed)

## 2020-02-26 ENCOUNTER — Other Ambulatory Visit: Payer: Self-pay

## 2020-02-26 ENCOUNTER — Ambulatory Visit
Admission: RE | Admit: 2020-02-26 | Discharge: 2020-02-26 | Disposition: A | Discharge status: Home / Self Care | Payer: Medicare Other | Source: Ambulatory Visit | Attending: Physician Assistant | Admitting: Physician Assistant

## 2020-02-26 DIAGNOSIS — R93429 Abnormal radiologic findings on diagnostic imaging of unspecified kidney: Secondary | ICD-10-CM | POA: Insufficient documentation

## 2020-03-01 NOTE — Progress Notes (Signed)
I have reviewed and agreed above plan. 

## 2020-03-03 LAB — HM DIABETES EYE EXAM

## 2020-03-04 ENCOUNTER — Ambulatory Visit (INDEPENDENT_AMBULATORY_CARE_PROVIDER_SITE_OTHER): Payer: Medicare Other | Admitting: Family Medicine

## 2020-03-04 ENCOUNTER — Other Ambulatory Visit: Payer: Self-pay

## 2020-03-04 ENCOUNTER — Encounter: Payer: Self-pay | Admitting: Family Medicine

## 2020-03-04 VITALS — BP 120/72 | HR 66 | Temp 97.3°F | Resp 16 | Ht 61.0 in | Wt 187.2 lb

## 2020-03-04 DIAGNOSIS — Z23 Encounter for immunization: Secondary | ICD-10-CM | POA: Diagnosis not present

## 2020-03-04 DIAGNOSIS — G720 Drug-induced myopathy: Secondary | ICD-10-CM

## 2020-03-04 DIAGNOSIS — D709 Neutropenia, unspecified: Secondary | ICD-10-CM

## 2020-03-04 DIAGNOSIS — E782 Mixed hyperlipidemia: Secondary | ICD-10-CM | POA: Diagnosis not present

## 2020-03-04 DIAGNOSIS — I493 Ventricular premature depolarization: Secondary | ICD-10-CM | POA: Diagnosis not present

## 2020-03-04 DIAGNOSIS — I6529 Occlusion and stenosis of unspecified carotid artery: Secondary | ICD-10-CM

## 2020-03-04 DIAGNOSIS — T466X5A Adverse effect of antihyperlipidemic and antiarteriosclerotic drugs, initial encounter: Secondary | ICD-10-CM

## 2020-03-04 DIAGNOSIS — R569 Unspecified convulsions: Secondary | ICD-10-CM

## 2020-03-04 DIAGNOSIS — Z780 Asymptomatic menopausal state: Secondary | ICD-10-CM

## 2020-03-04 DIAGNOSIS — D631 Anemia in chronic kidney disease: Secondary | ICD-10-CM

## 2020-03-04 DIAGNOSIS — N184 Chronic kidney disease, stage 4 (severe): Secondary | ICD-10-CM

## 2020-03-04 DIAGNOSIS — Z5181 Encounter for therapeutic drug level monitoring: Secondary | ICD-10-CM

## 2020-03-04 DIAGNOSIS — M8580 Other specified disorders of bone density and structure, unspecified site: Secondary | ICD-10-CM

## 2020-03-04 DIAGNOSIS — Z1231 Encounter for screening mammogram for malignant neoplasm of breast: Secondary | ICD-10-CM

## 2020-03-04 DIAGNOSIS — J309 Allergic rhinitis, unspecified: Secondary | ICD-10-CM

## 2020-03-04 DIAGNOSIS — I69351 Hemiplegia and hemiparesis following cerebral infarction affecting right dominant side: Secondary | ICD-10-CM

## 2020-03-04 DIAGNOSIS — F419 Anxiety disorder, unspecified: Secondary | ICD-10-CM

## 2020-03-04 DIAGNOSIS — I10 Essential (primary) hypertension: Secondary | ICD-10-CM

## 2020-03-04 MED ORDER — EZETIMIBE 10 MG PO TABS: 10.0000 mg | ORAL_TABLET | Freq: Every day | ORAL | 3 refills | Status: DC

## 2020-03-04 MED ORDER — CITALOPRAM HYDROBROMIDE 20 MG PO TABS: ORAL_TABLET | ORAL | 1 refills | Status: DC
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
## 2020-03-05 LAB — COMPLETE METABOLIC PANEL WITH GFR
AG Ratio: 1.2 (calc) (ref 1.0–2.5)
ALT: 10 U/L (ref 6–29)
AST: 13 U/L (ref 10–35)
Albumin: 4 g/dL (ref 3.6–5.1)
Alkaline phosphatase (APISO): 105 U/L (ref 37–153)
BUN/Creatinine Ratio: 18 (calc) (ref 6–22)
BUN: 47 mg/dL — ABNORMAL HIGH (ref 7–25)
CO2: 20 mmol/L (ref 20–32)
Calcium: 9.8 mg/dL (ref 8.6–10.4)
Chloride: 105 mmol/L (ref 98–110)
Creat: 2.68 mg/dL — ABNORMAL HIGH (ref 0.60–0.93)
GFR, Est African American: 20 mL/min/{1.73_m2} — ABNORMAL LOW (ref >=60)
GFR, Est Non African American: 17 mL/min/{1.73_m2} — ABNORMAL LOW (ref >=60)
Globulin: 3.3 g/dL (calc) (ref 1.9–3.7)
Glucose, Bld: 84 mg/dL (ref 65–99)
Potassium: 4.4 mmol/L (ref 3.5–5.3)
Sodium: 137 mmol/L (ref 135–146)
Total Bilirubin: 0.3 mg/dL (ref 0.2–1.2)
Total Protein: 7.3 g/dL (ref 6.1–8.1)

## 2020-03-05 LAB — CBC WITH DIFFERENTIAL/PLATELET
Absolute Monocytes: 374 cells/uL (ref 200–950)
Basophils Absolute: 22 cells/uL (ref 0–200)
Basophils Relative: 0.5 %
Eosinophils Absolute: 92 cells/uL (ref 15–500)
Eosinophils Relative: 2.1 %
HCT: 34.2 % — ABNORMAL LOW (ref 35.0–45.0)
Hemoglobin: 11.2 g/dL — ABNORMAL LOW (ref 11.7–15.5)
Lymphs Abs: 1632 cells/uL (ref 850–3900)
MCH: 31.5 pg (ref 27.0–33.0)
MCHC: 32.7 g/dL (ref 32.0–36.0)
MCV: 96.3 fL (ref 80.0–100.0)
MPV: 10.2 fL (ref 7.5–12.5)
Monocytes Relative: 8.5 %
Neutro Abs: 2279 cells/uL (ref 1500–7800)
Neutrophils Relative %: 51.8 %
Platelets: 229 10*3/uL (ref 140–400)
RBC: 3.55 10*6/uL — ABNORMAL LOW (ref 3.80–5.10)
RDW: 12.1 % (ref 11.0–15.0)
Total Lymphocyte: 37.1 %
WBC: 4.4 10*3/uL (ref 3.8–10.8)

## 2020-03-05 LAB — LIPID PANEL
Cholesterol: 161 mg/dL (ref <200)
HDL: 76 mg/dL (ref >=50)
LDL Cholesterol (Calc): 72 mg/dL (calc)
Non-HDL Cholesterol (Calc): 85 mg/dL (calc) (ref <130)
Total CHOL/HDL Ratio: 2.1 (calc) (ref <5.0)
Triglycerides: 53 mg/dL (ref <150)

## 2020-03-08 ENCOUNTER — Telehealth: Payer: Self-pay

## 2020-03-08 ENCOUNTER — Encounter: Payer: Self-pay | Admitting: Family Medicine

## 2020-03-08 NOTE — Telephone Encounter (Signed)
-----   Message from Debroah Loop, [place] sent at 03/08/2020  3:56 PM EST ----- Please contact [name] and notify her that Ms. Horgan's renal ultrasound shows improvement of the swelling in her kidneys compared to prior.  Additionally, her recent kidney labs are stable to improved.  We may continue to watch her closely and manage her conservatively at this point.  This is very good news.  She should keep her follow-up with me as scheduled.

## 2020-03-08 NOTE — Telephone Encounter (Signed)
Notified patients caregiver as advised, [name] verbalized understanding. Confirmed next follow up.

## 2020-03-09 ENCOUNTER — Encounter: Payer: Self-pay | Admitting: Emergency Medicine

## 2020-03-09 ENCOUNTER — Other Ambulatory Visit: Payer: Self-pay

## 2020-03-09 ENCOUNTER — Encounter: Payer: Self-pay | Admitting: Family Medicine

## 2020-03-09 ENCOUNTER — Ambulatory Visit (INDEPENDENT_AMBULATORY_CARE_PROVIDER_SITE_OTHER): Payer: Medicare Other | Admitting: Family Medicine

## 2020-03-09 ENCOUNTER — Encounter: Payer: Self-pay | Admitting: Ophthalmology

## 2020-03-09 VITALS — BP 118/72 | HR 64 | Temp 97.6°F | Resp 16 | Ht 61.0 in | Wt 187.7 lb

## 2020-03-09 DIAGNOSIS — H6093 Unspecified otitis externa, bilateral: Secondary | ICD-10-CM

## 2020-03-09 DIAGNOSIS — I890 Lymphedema, not elsewhere classified: Secondary | ICD-10-CM

## 2020-03-09 DIAGNOSIS — I493 Ventricular premature depolarization: Secondary | ICD-10-CM | POA: Diagnosis not present

## 2020-03-09 DIAGNOSIS — G720 Drug-induced myopathy: Secondary | ICD-10-CM | POA: Insufficient documentation

## 2020-03-09 DIAGNOSIS — R001 Bradycardia, unspecified: Secondary | ICD-10-CM

## 2020-03-09 DIAGNOSIS — T466X5A Adverse effect of antihyperlipidemic and antiarteriosclerotic drugs, initial encounter: Secondary | ICD-10-CM | POA: Insufficient documentation

## 2020-03-09 DIAGNOSIS — I10 Essential (primary) hypertension: Secondary | ICD-10-CM | POA: Diagnosis not present

## 2020-03-09 DIAGNOSIS — H9193 Unspecified hearing loss, bilateral: Secondary | ICD-10-CM

## 2020-03-09 DIAGNOSIS — I5030 Unspecified diastolic (congestive) heart failure: Secondary | ICD-10-CM | POA: Diagnosis not present

## 2020-03-09 DIAGNOSIS — F4312 Post-traumatic stress disorder, chronic: Secondary | ICD-10-CM

## 2020-03-09 MED ORDER — TORSEMIDE 20 MG PO TABS: 10.0000 mg | ORAL_TABLET | Freq: Every day | ORAL | 5 refills | Status: DC

## 2020-03-09 MED ORDER — NEOMYCIN-POLYMYXIN-HC 3.5-10000-1 OT SOLN: 4.0000 [drp] | Freq: Four times a day (QID) | OTIC | 1 refills | Status: AC
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
## 2020-03-10 ENCOUNTER — Ambulatory Visit: Payer: Medicare Other | Admitting: Anesthesiology

## 2020-03-11 ENCOUNTER — Other Ambulatory Visit: Payer: Self-pay

## 2020-03-11 ENCOUNTER — Other Ambulatory Visit
Admission: RE | Admit: 2020-03-11 | Discharge: 2020-03-11 | Disposition: A | Discharge status: Home / Self Care | Payer: Medicare Other | Source: Ambulatory Visit | Attending: Ophthalmology | Admitting: Ophthalmology

## 2020-03-11 DIAGNOSIS — Z01812 Encounter for preprocedural laboratory examination: Secondary | ICD-10-CM | POA: Diagnosis present

## 2020-03-11 DIAGNOSIS — Z20822 Contact with and (suspected) exposure to covid-19: Secondary | ICD-10-CM | POA: Diagnosis not present

## 2020-03-11 LAB — SARS CORONAVIRUS 2 (TAT 6-24 HRS): SARS Coronavirus 2: NEGATIVE

## 2020-03-14 NOTE — Discharge Instructions (Signed)

## 2020-03-15 ENCOUNTER — Ambulatory Visit
Admission: RE | Admit: 2020-03-15 | Discharge: 2020-03-15 | Disposition: A | Discharge status: Home / Self Care | Payer: Medicare Other | Attending: Ophthalmology | Admitting: Ophthalmology

## 2020-03-15 ENCOUNTER — Encounter
Admission: RE | Disposition: A | Discharge status: Home / Self Care | Payer: Self-pay | Source: Home / Self Care | Attending: Ophthalmology

## 2020-03-15 ENCOUNTER — Encounter: Payer: Self-pay | Admitting: Ophthalmology

## 2020-03-15 ENCOUNTER — Other Ambulatory Visit: Payer: Self-pay

## 2020-03-15 DIAGNOSIS — Z538 Procedure and treatment not carried out for other reasons: Secondary | ICD-10-CM | POA: Insufficient documentation

## 2020-03-15 HISTORY — DX: Post-traumatic stress disorder, unspecified: F43.10

## 2020-03-15 HISTORY — DX: Weakness: R53.1

## 2020-03-15 HISTORY — DX: Unspecified hearing loss, unspecified ear: H91.90

## 2020-03-15 HISTORY — DX: Other specified postprocedural states: Z98.890

## 2020-03-15 HISTORY — DX: Complete loss of teeth, unspecified cause, unspecified class: K08.109

## 2020-03-15 HISTORY — DX: Other specified postprocedural states: R11.2

## 2020-03-15 SURGERY — PHACOEMULSIFICATION, CATARACT, WITH IOL INSERTION
Anesthesia: Monitor Anesthesia Care

## 2020-03-15 MED ORDER — ARMC OPHTHALMIC DILATING DROPS
1.0000 "application " | OPHTHALMIC | Status: DC | PRN
  Administered 2020-03-15 (×3): 1 via OPHTHALMIC

## 2020-03-15 MED ORDER — TETRACAINE HCL 0.5 % OP SOLN
1.0000 [drp] | OPHTHALMIC | Status: DC | PRN
  Administered 2020-03-15 (×3): 1 [drp] via OPHTHALMIC

## 2020-03-15 MED ORDER — LACTATED RINGERS IV SOLN: INTRAVENOUS | Status: DC

## 2020-03-15 SURGICAL SUPPLY — 20 items
CANNULA ANT/CHMB 27G (MISCELLANEOUS) ×2 IMPLANT
CANNULA ANT/CHMB 27GA (MISCELLANEOUS) ×4 IMPLANT
GLOVE SURG LX 8.0 MICRO (GLOVE) ×1
GLOVE SURG LX STRL 8.0 MICRO (GLOVE) ×2 IMPLANT
GLOVE SURG TRIUMPH 8.0 PF LTX (GLOVE) ×3 IMPLANT
GOWN STRL REUS W/ TWL LRG LVL3 (GOWN DISPOSABLE) ×4 IMPLANT
GOWN STRL REUS W/TWL LRG LVL3 (GOWN DISPOSABLE) ×4
MARKER SKIN DUAL TIP RULER LAB (MISCELLANEOUS) ×3 IMPLANT
NDL FILTER BLUNT 18X1 1/2 (NEEDLE) ×1 IMPLANT
NEEDLE FILTER BLUNT 18X 1/2SAF (NEEDLE) ×1
NEEDLE FILTER BLUNT 18X1 1/2 (NEEDLE) ×1 IMPLANT
PACK EYE AFTER SURG (MISCELLANEOUS) ×3 IMPLANT
PACK OPTHALMIC (MISCELLANEOUS) ×3 IMPLANT
PACK PORFILIO (MISCELLANEOUS) ×3 IMPLANT
SUT ETHILON 10-0 CS-B-6CS-B-6 (SUTURE)
SUTURE EHLN 10-0 CS-B-6CS-B-6 (SUTURE) IMPLANT
SYR 3ML LL SCALE MARK (SYRINGE) ×3 IMPLANT
SYR TB 1ML LUER SLIP (SYRINGE) ×3 IMPLANT
WATER STERILE IRR 250ML POUR (IV SOLUTION) ×3 IMPLANT
WIPE NON LINTING 3.25X3.25 (MISCELLANEOUS) ×3 IMPLANT

## 2020-03-15 NOTE — Anesthesia Preprocedure Evaluation (Deleted)
Anesthesia Evaluation  Patient identified by MRN, date of birth, ID band Patient awake    Reviewed: Allergy & Precautions, NPO status , Patient's Chart, lab work & pertinent test results, reviewed documented beta blocker date and time   History of Anesthesia Complications (+) PONV and history of anesthetic complications  Airway Mallampati: III  TM Distance: >3 FB Neck ROM: Limited    Dental   Pulmonary former smoker,    breath sounds clear to auscultation       Cardiovascular hypertension, (-) angina+ CAD, + Cardiac Stents (2000), + Peripheral Vascular Disease and +CHF  (-) DOE + dysrhythmias (Mobitz type I second-degree AV block, sinus pauses)  Rhythm:Regular Rate:Normal   HLD   Neuro/Psych Seizures -,  PSYCHIATRIC DISORDERS (PTSD) Anxiety CVA (2016, R hemiparesis), Residual Symptoms    GI/Hepatic PUD, GERD  Controlled,  Endo/Other  diabetes  Renal/GU CRFRenal disease     Musculoskeletal  Gout   Abdominal (+) + obese (BMI 35),   Peds  Hematology  (+) anemia ,   Anesthesia Other Findings   Reproductive/Obstetrics                            Anesthesia Physical Anesthesia Plan  ASA: IV  Anesthesia Plan: MAC   Post-op Pain Management:    Induction: Intravenous  PONV Risk Score and Plan: 3 and TIVA, Midazolam and Treatment may vary due to age or medical condition  Airway Management Planned: Nasal Cannula  Additional Equipment:   Intra-op Plan:   Post-operative Plan:   Informed Consent: I have reviewed the patients History and Physical, chart, labs and discussed the procedure including the risks, benefits and alternatives for the proposed anesthesia with the patient or authorized representative who has indicated his/her understanding and acceptance.       Plan Discussed with: CRNA and Anesthesiologist  Anesthesia Plan Comments:         Anesthesia Quick Evaluation

## 2020-03-15 NOTE — Progress Notes (Signed)
Canceled due to bed bugs

## 2020-03-18 ENCOUNTER — Other Ambulatory Visit: Payer: Self-pay | Admitting: Family Medicine

## 2020-03-18 DIAGNOSIS — G8929 Other chronic pain: Secondary | ICD-10-CM

## 2020-03-18 DIAGNOSIS — M25562 Pain in left knee: Secondary | ICD-10-CM

## 2020-05-03 ENCOUNTER — Encounter: Payer: Self-pay | Admitting: Cardiovascular Disease

## 2020-05-03 ENCOUNTER — Ambulatory Visit (INDEPENDENT_AMBULATORY_CARE_PROVIDER_SITE_OTHER): Payer: Medicare Other | Admitting: Cardiovascular Disease

## 2020-05-03 ENCOUNTER — Other Ambulatory Visit: Payer: Self-pay

## 2020-05-03 ENCOUNTER — Telehealth: Payer: Self-pay | Admitting: Cardiovascular Disease

## 2020-05-03 ENCOUNTER — Other Ambulatory Visit
Admission: RE | Admit: 2020-05-03 | Discharge: 2020-05-03 | Disposition: A | Discharge status: Home / Self Care | Payer: Medicare Other | Source: Ambulatory Visit | Attending: Cardiovascular Disease | Admitting: Cardiovascular Disease

## 2020-05-03 VITALS — BP 130/60 | HR 55 | Ht <= 58 in | Wt 196.0 lb

## 2020-05-03 DIAGNOSIS — I25118 Atherosclerotic heart disease of native coronary artery with other forms of angina pectoris: Secondary | ICD-10-CM | POA: Diagnosis not present

## 2020-05-03 DIAGNOSIS — I5030 Unspecified diastolic (congestive) heart failure: Secondary | ICD-10-CM | POA: Diagnosis not present

## 2020-05-03 DIAGNOSIS — I5031 Acute diastolic (congestive) heart failure: Secondary | ICD-10-CM

## 2020-05-03 DIAGNOSIS — I10 Essential (primary) hypertension: Secondary | ICD-10-CM

## 2020-05-03 LAB — COMPREHENSIVE METABOLIC PANEL
ALT: 9 U/L (ref 0–44)
AST: 13 U/L — ABNORMAL LOW (ref 15–41)
Albumin: 3.8 g/dL (ref 3.5–5.0)
Alkaline Phosphatase: 95 U/L (ref 38–126)
Anion gap: 9 (ref 5–15)
BUN: 41 mg/dL — ABNORMAL HIGH (ref 8–23)
CO2: 22 mmol/L (ref 22–32)
Calcium: 9.5 mg/dL (ref 8.9–10.3)
Chloride: 107 mmol/L (ref 98–111)
Creatinine, Ser: 2.23 mg/dL — ABNORMAL HIGH (ref 0.44–1.00)
GFR, Estimated: 23 mL/min — ABNORMAL LOW (ref >60)
Glucose, Bld: 96 mg/dL (ref 70–99)
Potassium: 3.7 mmol/L (ref 3.5–5.1)
Sodium: 138 mmol/L (ref 135–145)
Total Bilirubin: 0.7 mg/dL (ref 0.3–1.2)
Total Protein: 8.4 g/dL — ABNORMAL HIGH (ref 6.5–8.1)

## 2020-05-03 LAB — CBC
HCT: 35.2 % — ABNORMAL LOW (ref 36.0–46.0)
Hemoglobin: 11.2 g/dL — ABNORMAL LOW (ref 12.0–15.0)
MCH: 31.9 pg (ref 26.0–34.0)
MCHC: 31.8 g/dL (ref 30.0–36.0)
MCV: 100.3 fL — ABNORMAL HIGH (ref 80.0–100.0)
Platelets: 213 10*3/uL (ref 150–400)
RBC: 3.51 MIL/uL — ABNORMAL LOW (ref 3.87–5.11)
RDW: 12.5 % (ref 11.5–15.5)
WBC: 5.1 10*3/uL (ref 4.0–10.5)
nRBC: 0 % (ref 0.0–0.2)

## 2020-05-03 MED ORDER — TORSEMIDE 20 MG PO TABS: 20.0000 mg | ORAL_TABLET | Freq: Every day | ORAL | 3 refills | Status: DC

## 2020-05-03 NOTE — Progress Notes (Signed)
Cardiology Office Note  Date:  05/03/2020   ID:  Leslie Houston, DOB 10/28/1950, MRN [id number]  PCP:  Delsa Grana, PA-C   Chief Complaint  Patient presents with  . NEW patient-BLLE swelling    Weight gain    HPI:  Leslie Houston is a 70 y.o. female with a hx of  chronic kidney disease,  seizure disorder,  prior stroke residual right-sided vision loss/hearing loss,  presenting to the hospital March 2021 with worsening renal function, worsening leg swelling shortness of breath symptoms over the past 2 months Who presents for office follow-up of her CHF  Admission to the hospital March 2021 Had massive leg swelling, shortness of breath, Also noted to be anemic hematocrit 26/hemoglobin 8.7 - troponin Amlodipine was held, Echo with normal ejection fraction It was felt her anemia was secondary to renal disease and iron deficiency  Blood pressure medication changes made Had obstructive uropathy Sinus pauses initially noted on telemetry resolved Felt vagal reaction, no indication for pacing Recommendation made for outpatient sleep study It was recommended that she avoid beta-blockers, clonidine, verapamil/diltiazem  In follow-up today she has dramatic weight gain 10 to 12 pounds, Worsening leg swelling to above the knees bilaterally Denies excessive fluid intake, has been taking torsemide 10 daily  2D echo 07/03/2019: 1. Left ventricular ejection fraction, by estimation, is 50 to 55%.   EKG personally reviewed by myself on todays visit Shows sinus bradycardia rate 55 bpm nonspecific T wave abnormality, no significant change  PMH:   has a past medical history of CAD (coronary artery disease), Carotid stenosis, CHF (congestive heart failure) ([place]), CKD (chronic kidney disease), stage III ([place]), Diabetes mellitus ([place]), Edentulous, Family hx of colon cancer (06/13/2018), Gout, HOH (hard of hearing), Hyperlipidemia, Hypertension, Orthostatic hypotension, Orthostatic hypotension,  Osteopenia (01/24/2018), PONV (postoperative nausea and vomiting), PTSD (post-traumatic stress disorder), PUD (peptic ulcer disease), PVC's (premature ventricular contractions), Seizures ([place]), Sinus bradycardia, Stroke ([place]), Syncope, and Weakness.  PSH:    Past Surgical History:  Procedure Laterality Date  . BLADDER SURGERY    . CARDIAC CATHETERIZATION    . CESAREAN SECTION    . DENTAL SURGERY    . Heart stent    . stint      Current Outpatient Medications  Medication Sig Dispense Refill  . amLODipine (NORVASC) 5 MG tablet Take 5 mg by mouth daily.    . ASPIRIN LOW DOSE 81 MG EC tablet TAKE 1 TABLET BY MOUTH ONCE DAILY *SWALLOW WHOLE* 30 tablet 11  . citalopram (CELEXA) 20 MG tablet Take 1 tablet once daily 90 tablet 1  . diclofenac sodium (VOLTAREN) 1 % GEL Apply 4 g topically 4 (four) times daily as needed.    . ezetimibe (ZETIA) 10 MG tablet Take 1 tablet (10 mg total) by mouth daily. 90 tablet 3  . fluticasone (FLONASE) 50 MCG/ACT nasal spray Place 2 sprays into both nostrils daily as needed for allergies (allergies).     [name] levETIRAcetam (KEPPRA) 500 MG tablet Take 1 in the morning, take 2 at night 270 tablet 3  . loratadine (CLARITIN) 10 MG tablet Take 10 mg by mouth daily as needed for allergies.    [name] lovastatin (MEVACOR) 40 MG tablet Take 40 mg by mouth daily.    . tamsulosin (FLOMAX) 0.4 MG CAPS capsule TAKE 1 CAPSULE BY MOUTH ONCE DAILY 90 capsule 3  . torsemide (DEMADEX) 20 MG tablet Take 0.5 tablets (10 mg total) by mouth daily. 15 tablet 5   No current facility-administered medications  for this visit.     Allergies:   Latex, Penicillin g, Atorvastatin, and Other   Social History:  The patient  reports that she quit smoking about 6 years ago. She has never used smokeless tobacco. She reports that she does not drink alcohol and does not use drugs.   Family History:   family history includes Cancer in her paternal grandfather; Colon cancer in her father; Diabetes Mellitus  II in her daughter; Heart attack in her mother; Heart failure in her son; Kidney disease in her son; Lung cancer in her mother; Multiple sclerosis in her daughter; Prostate cancer in her father; Stroke in her father and mother.    Review of Systems: Review of Systems  Constitutional: Negative.   HENT: Negative.   Eyes: Negative.   Respiratory: Negative.   Cardiovascular: Negative.   Gastrointestinal: Negative.   Musculoskeletal: Negative.   Neurological: Negative.   Psychiatric/Behavioral: Negative.   All other systems reviewed and are negative.   PHYSICAL EXAM: VS:  BP 130/60 (BP Location: Right Arm, Patient Position: Sitting, Cuff Size: Large)   Pulse (!) 55   Ht 4\' 10"  (1.473 m)   Wt 196 lb (88.9 kg)   SpO2 97%   BMI 40.96 kg/m  , BMI Body mass index is 40.96 kg/m. GEN: Well nourished, well developed, in no acute distress HEENT: normal Neck: no JVD, carotid bruits, or masses Cardiac: RRR; no murmurs, rubs, or gallops,no edema  Respiratory:  clear to auscultation bilaterally, normal work of breathing GI: soft, nontender, nondistended, + BS MS: no deformity or atrophy Skin: warm and dry, no rash Neuro:  Strength and sensation are intact Psych: euthymic mood, full affect  Recent Labs: 07/02/2019: B Natriuretic Peptide 180.0; TSH 2.892 03/04/2020: ALT 10; BUN 47; Creat 2.68; Hemoglobin 11.2; Platelets 229; Potassium 4.4; Sodium 137    Lipid Panel Lab Results  Component Value Date   CHOL 161 03/04/2020   HDL 76 03/04/2020   LDLCALC 72 03/04/2020   TRIG 53 03/04/2020      Wt Readings from Last 3 Encounters:  05/03/20 196 lb (88.9 kg)  03/15/20 186 lb 11.2 oz (84.7 kg)  03/09/20 187 lb 11.2 oz (85.1 kg)      ASSESSMENT AND PLAN:  Problem List Items Addressed This Visit      Cardiology Problems   Coronary artery disease - Primary   Relevant Orders   EKG 12-Lead   Essential hypertension   Acute CHF (congestive heart failure) (HCC)   Relevant Orders   EKG  12-Lead     Acute on chronic diastolic CHF Weight is up 10 to 12 pounds, Unclear whether she has excessive fluid intake We will increase torsemide up to 40 mg daily for 5 days then down to 20 mg daily We will hold the amlodipine which can cause leg swelling Goal weight in the 186 range, currently 196 Appointment in 1 week We will need a BMP on follow-up check next week  Chronic kidney disease We have ordered basic metabolic panel  Anemia CBC ordered  History of stroke On aspirin, statin Maintaining normal sinus rhythm  History of obstructive uropathy We will touch base with urology today, they are monitoring her closely I have performed renal ultrasound   Total encounter time more than 35 minutes  Greater than 50% was spent in counseling and coordination of care with the patient    Signed, Esmond Plants, M.D., Ph.D. [place], [place]

## 2020-05-03 NOTE — Telephone Encounter (Signed)
Attempted to schedule 1 week fu   Call dropped .

## 2020-05-03 NOTE — Patient Instructions (Addendum)
Medication Instructions:  Stop the amlodipine  Take torsemide 40 mg once a day for 5 days Then take torsemide 20 mg once a day  If you need a refill on your cardiac medications before your next appointment, please call your pharmacy.    Lab work: CMP and CBC today at [facility]   If you have labs (blood work) drawn today and your tests are completely normal, you will receive your results only by: [name] MyChart Message (if you have MyChart) OR . A paper copy in the mail If you have any lab test that is abnormal or we need to change your treatment, we will call you to review the results.   Testing/Procedures: No new testing needed   Follow-Up: At [facility], you and your health needs are our priority.  As part of our continuing mission to provide you with exceptional heart care, we have created designated Provider Care Teams.  These Care Teams include your primary Cardiologist (physician) and Advanced Practice Providers (APPs -  Physician Assistants and Nurse Practitioners) who all work together to provide you with the care you need, when you need it.  . You will need a follow up appointment in 1 week  . Providers on your designated Care Team:   . Murray Hodgkins, NP . Christell Faith, PA-C . Marrianne Mood, PA-C  Any Other Special Instructions Will Be Listed Below (If Applicable).  COVID-19 Vaccine Information can be found at: ShippingScam.co.uk For questions related to vaccine distribution or appointments, please email [email or web address] or call [phone].

## 2020-05-04 ENCOUNTER — Telehealth: Payer: Self-pay | Admitting: Cardiovascular Disease

## 2020-05-04 ENCOUNTER — Other Ambulatory Visit: Payer: Self-pay

## 2020-05-04 DIAGNOSIS — I10 Essential (primary) hypertension: Secondary | ICD-10-CM

## 2020-05-04 DIAGNOSIS — I5030 Unspecified diastolic (congestive) heart failure: Secondary | ICD-10-CM

## 2020-05-04 MED ORDER — TORSEMIDE 20 MG PO TABS: 20.0000 mg | ORAL_TABLET | Freq: Every day | ORAL | 3 refills | Status: DC

## 2020-05-04 NOTE — Telephone Encounter (Signed)
Spoke with caretaker [name] (DPR approved), script was updated to pharmacy, although her pill dosing is not changing, she will still be prescribe the 20mg  pills, but reeducated [name] on taking torsemide 40 mg once a day for 5 days, Then take torsemide 20 mg once a day. [name] verbalized understanding, Otherwise all questions or concerns were address and no additional concerns at this time. Agreeable to plan, will call back for anything further.

## 2020-05-04 NOTE — Telephone Encounter (Signed)
Please call caretaker to discuss Torsemide increase . She states pharmacy does not have record of this.

## 2020-05-12 ENCOUNTER — Other Ambulatory Visit: Payer: Self-pay

## 2020-05-12 ENCOUNTER — Ambulatory Visit (INDEPENDENT_AMBULATORY_CARE_PROVIDER_SITE_OTHER): Payer: Medicare Other | Admitting: Nurse Practitioner

## 2020-05-12 ENCOUNTER — Encounter: Payer: Self-pay | Admitting: Nurse Practitioner

## 2020-05-12 VITALS — BP 138/60 | HR 54 | Ht 59.0 in | Wt 190.0 lb

## 2020-05-12 DIAGNOSIS — N184 Chronic kidney disease, stage 4 (severe): Secondary | ICD-10-CM

## 2020-05-12 DIAGNOSIS — I10 Essential (primary) hypertension: Secondary | ICD-10-CM | POA: Diagnosis not present

## 2020-05-12 DIAGNOSIS — I5033 Acute on chronic diastolic (congestive) heart failure: Secondary | ICD-10-CM | POA: Diagnosis not present

## 2020-05-12 DIAGNOSIS — E782 Mixed hyperlipidemia: Secondary | ICD-10-CM

## 2020-05-12 DIAGNOSIS — I25118 Atherosclerotic heart disease of native coronary artery with other forms of angina pectoris: Secondary | ICD-10-CM | POA: Diagnosis not present

## 2020-05-12 DIAGNOSIS — I2510 Atherosclerotic heart disease of native coronary artery without angina pectoris: Secondary | ICD-10-CM | POA: Diagnosis not present

## 2020-05-12 MED ORDER — TORSEMIDE 20 MG PO TABS: 20.0000 mg | ORAL_TABLET | Freq: Every day | ORAL | 3 refills | Status: DC

## 2020-05-12 NOTE — Patient Instructions (Signed)
Medication Instructions:   1) Continue with torsemide 20 mg- take 1 tablet by mouth once daily  *If you need a refill on your cardiac medications before your next appointment, please call your pharmacy*   Lab Work: - Your physician recommends that you have lab work today: BMP  If you have labs (blood work) drawn today and your tests are completely normal, you will receive your results only by: [name] MyChart Message (if you have MyChart) OR . A paper copy in the mail If you have any lab test that is abnormal or we need to change your treatment, we will call you to review the results.   Testing/Procedures: - none ordered   Follow-Up: At [facility], you and your health needs are our priority.  As part of our continuing mission to provide you with exceptional heart care, we have created designated Provider Care Teams.  These Care Teams include your primary Cardiologist (physician) and Advanced Practice Providers (APPs -  Physician Assistants and Nurse Practitioners) who all work together to provide you with the care you need, when you need it.  We recommend signing up for the patient portal called "MyChart".  Sign up information is provided on this After Visit Summary.  MyChart is used to connect with patients for Virtual Visits (Telemedicine).  Patients are able to view lab/test results, encounter notes, upcoming appointments, etc.  Non-urgent messages can be sent to your provider as well.   To learn more about what you can do with MyChart, go to [email or web address].    Your next appointment:   2-3 week(s)  The format for your next appointment:   In Person  Provider:   You may see Ida Rogue, MD or one of the following Advanced Practice Providers on your designated Care Team:    Murray Hodgkins, NP  Christell Faith, PA-C  Marrianne Mood, PA-C  Cadence Lowry, [place]  Laurann Montana, NP    Other Instructions

## 2020-05-12 NOTE — Progress Notes (Signed)
Office Visit    Patient Name: Angles Trevizo Date of Encounter: 05/12/2020  Primary Care Provider:  Delsa Grana, PA-C Primary Cardiologist:  Ida Rogue, MD  Chief Complaint    70 y/o ? w/a h/o HFpEF, CAD, HTN, HL, DMII, CKD IV, stroke, anemia of chronic dzs, and orthostatic hypotension, who presents for follow-up related to weight gain and heart failure.  Past Medical History    Past Medical History:  Diagnosis Date  . (HFpEF) heart failure with preserved ejection fraction ([place])    a. Pt reports in 2000 she had CHF, details unclear, outside hospital; b. 06/2019 Echo: EF 50-55%, no rwma, Gr2 DD,  mildly dil LA, mild MR.  [name] CAD (coronary artery disease)    a. Pt reports in 2000 she had a stent to a coronary artery, details unclear, outside hospital.  . Carotid stenosis    a. CT angio head 07/2014 - 50-60% stenoses of prox ICA bilaterally.  . CKD (chronic kidney disease), stage III ([place])   . Diabetes mellitus ([place])    Denies.  . Edentulous    No upper teeth  . Family hx of colon cancer 06/13/2018   Father diagnosed in his 40's  . Gout   . HOH (hard of hearing)    usually needs to read lips  . Hyperlipidemia   . Hypertension   . Orthostatic hypotension   . Osteopenia 01/24/2018   DEXA Sept 2019  . PONV (postoperative nausea and vomiting)   . PTSD (post-traumatic stress disorder)       . PUD (peptic ulcer disease)   . PVC's (premature ventricular contractions)   . Seizures ([place])   . Sinus bradycardia   . Stroke [facility])    a. L PCA CVA in 05/2014.  [name] Syncope   . Weakness    left side S/P stroke   Past Surgical History:  Procedure Laterality Date  . BLADDER SURGERY    . CARDIAC CATHETERIZATION    . CESAREAN SECTION    . DENTAL SURGERY    . Heart stent    . stint      Allergies  Allergies  Allergen Reactions  . Latex Rash  . Penicillin G Itching  . Atorvastatin Other (See Comments)    Myalgias   . Other Hives    Adhesive tape    History of Present  Illness    70 year old female with the above past medical history including HFpEF, coronary artery disease, hypertension, hyperlipidemia, diabetes, stage IV chronic kidney disease, stroke, anemia of chronic disease, and orthostatic hypotension.  She reportedly underwent stenting in 2000, though details are unclear.  In March 2021, she was admitted with HFpEF and worsening renal function.  She was also noted to be anemic.  Echocardiogram showed an EF of 50-55% without any significant valvular abnormalities.  Grade 2 diastolic dysfunction was noted.  During hospitalization, sinus pauses were noted on telemetry, though these resolved and were ultimately felt to be vagal in etiology.  There was no indication for pacing.  At the time of March 2021 admission, she was on amlodipine and that was discontinued due to lower extremity edema, but later resumed in the setting of ongoing hypertension.  She was last seen in clinic on January 4, at which time she noted 10 to 12 pound weight gain with worsening leg swelling to the knees bilaterally.  She reported compliance with torsemide 10 mg daily at that time.  Torsemide was increased to 40 mg daily x5 days with  instructions to drop back down to 20 mg daily.  It was also noted that she had resumed amlodipine, which previously caused leg swelling, and this was held.  Prior dry weight was noted to be approximately 186 pounds.  Lab work was performed and showed stable anemia, electrolytes, and renal function with a BUN of 41 and creatinine of 2.23.  Since her last visit, she notes significant improvement in lower extremity edema and good response to the higher dose of torsemide.  She is currently on 20 mg daily.  She has noted an improvement in dyspnea on exertion and feels that she is probably back to her baseline level of dyspnea (some degree of chronic DOE).  She still has mild edema.  She has not had any chest pain and denies palpitations, PND, orthopnea, dizziness, syncope,  or early satiety.  Home Medications    Prior to Admission medications   Medication Sig Start Date End Date Taking? Authorizing Provider  ASPIRIN LOW DOSE 81 MG EC tablet TAKE 1 TABLET BY MOUTH ONCE DAILY *SWALLOW WHOLE* 08/19/19   Suzzanne Cloud, NP  citalopram (CELEXA) 20 MG tablet Take 1 tablet once daily 03/04/20   Delsa Grana, PA-C  diclofenac sodium (VOLTAREN) 1 % GEL Apply 4 g topically 4 (four) times daily as needed. 07/07/19   Jennye Boroughs, MD  ezetimibe (ZETIA) 10 MG tablet Take 1 tablet (10 mg total) by mouth daily. 03/04/20   Delsa Grana, PA-C  fluticasone (FLONASE) 50 MCG/ACT nasal spray Place 2 sprays into both nostrils daily as needed for allergies (allergies).     [provider]  levETIRAcetam (KEPPRA) 500 MG tablet Take 1 in the morning, take 2 at night 01/20/20   Suzzanne Cloud, NP  loratadine (CLARITIN) 10 MG tablet Take 10 mg by mouth daily as needed for allergies.    [provider]  lovastatin (MEVACOR) 40 MG tablet Take 40 mg by mouth daily.    [provider]  tamsulosin (FLOMAX) 0.4 MG CAPS capsule TAKE 1 CAPSULE BY MOUTH ONCE DAILY 01/29/20   Vaillancourt, Aldona Bar, PA-C  torsemide (DEMADEX) 20 MG tablet Take 1 tablet (20 mg total) by mouth daily. Take torsemide 40 mg once a day for 5 days, Then take torsemide 20 mg once a day 05/04/20   Minna Merritts, MD    Review of Systems    Still with mild edema but overall improved.  She has chronic, stable dyspnea on exertion which has improved on the higher dose of torsemide.  She denies chest pain, palpitations, PND, orthopnea, dizziness, syncope, or early satiety.  All other systems reviewed and are otherwise negative except as noted above.  Physical Exam    VS:  BP 138/60 (BP Location: Left Arm, Patient Position: Sitting, Cuff Size: Normal)   Pulse (!) 54   Ht 4\' 11"  (1.499 m)   Wt 190 lb (86.2 kg)   SpO2 96%   BMI 38.38 kg/m  , BMI Body mass index is 38.38 kg/m. GEN: Well nourished, well  developed, in no acute distress. HEENT: normal. Neck: Supple, no JVD, carotid bruits, or masses. Cardiac: RRR, distant, no murmurs, rubs, or gallops. No clubbing, cyanosis, 1+ right lower extremity, trace left lower extremity edema.  Radials/PT 2+ and equal bilaterally.  Respiratory:  Respirations regular and unlabored, clear to auscultation bilaterally. GI: Soft, nontender, nondistended, BS + x 4. MS: no deformity or atrophy. Skin: warm and dry, no rash. Neuro:  Strength and sensation are intact. Psych: Normal affect.  Accessory Clinical Findings    ECG personally reviewed by me today -sinus bradycardia, 54, first-degree AV block- no acute changes.  Lab Results  Component Value Date   WBC 5.1 05/03/2020   HGB 11.2 (L) 05/03/2020   HCT 35.2 (L) 05/03/2020   MCV 100.3 (H) 05/03/2020   PLT 213 05/03/2020   Lab Results  Component Value Date   CREATININE 2.23 (H) 05/03/2020   BUN 41 (H) 05/03/2020   NA 138 05/03/2020   K 3.7 05/03/2020   CL 107 05/03/2020   CO2 22 05/03/2020   Lab Results  Component Value Date   ALT 9 05/03/2020   AST 13 (L) 05/03/2020   ALKPHOS 95 05/03/2020   BILITOT 0.7 05/03/2020   Lab Results  Component Value Date   CHOL 161 03/04/2020   HDL 76 03/04/2020   LDLCALC 72 03/04/2020   TRIG 53 03/04/2020   CHOLHDL 2.1 03/04/2020    Lab Results  Component Value Date   HGBA1C 4.8 01/07/2019    Assessment & Plan    1.  Acute on chronic heart failure with preserved ejection fraction: EF 50-55% by echo in March 2021.  At follow-up visit on January 4, she reported a 10 to 12 pound weight gain and significant lower extremity edema.  Amlodipine was discontinued and torsemide was increased to 40 mg daily x5 days and then reduce to 20 mg daily (previous home dose 10 mg daily).  She has noted good response to higher doses of torsemide and her weight is down 5 pounds since her last visit.  She was 189 pounds on her home scale this morning (approximately 3 pounds  above previously noted dry weight).  She has mild lower extremity swelling and significant improvement in dyspnea on exertion.  I will follow-up a basic metabolic panel today and continue torsemide 20 mg daily for the time being.  She admits to dietary indiscretion over the holidays which likely led to volume excess and weight gain.  We discussed the importance of sodium restriction as well as close attention to daily weights with early symptom notification of our team, in order to prevent worsening symptoms.  With appropriate dietary modifications, we can hopefully get her back down to torsemide 10 mg daily given known stage III chronic kidney disease.  Her heart rate is stable today.  Blood pressure is mildly elevated at 138/60 though she notes she has been running in the 120s at home.  2.  Essential hypertension: Blood pressure mildly elevated at 138/60 today.  She is no longer on amlodipine in the setting of lower extremity swelling.  She says that she has been trending in the 120s at home.  I recommended that she write down her pressures over the next 2 to 3 weeks and bring at follow-up.  I suspect she will require replacement for amlodipine in with CKD IV and baseline bradycardia, simple options are limited, though she may do okay with once daily doxazosin if necessary.  3.  CKD IV:  F/u bmet today in light of higher dose of diuretic over the past 10 days.  4.  CAD:  Remote h/o stenting per pt.  No chest pain.  Cont asa, statin, zetia.  5.  HL:  LDL 72 in Nov on statin/zetia.  6.  Obstructive Uropathy:  Followed by urology.  Renal u/s in October showed improved hydronephrosis.  Renal fxn sl improved last week - f/u today.  7.  Disposition:  F/u bmet today.  F/u in clinic  in 2-3 wks to reassess volume status and bp.  Murray Hodgkins, NP 05/12/2020, 5:15 PM

## 2020-05-13 LAB — BASIC METABOLIC PANEL
BUN/Creatinine Ratio: 14 (ref 12–28)
BUN: 34 mg/dL — ABNORMAL HIGH (ref 8–27)
CO2: 25 mmol/L (ref 20–29)
Calcium: 9.6 mg/dL (ref 8.7–10.3)
Chloride: 101 mmol/L (ref 96–106)
Creatinine, Ser: 2.36 mg/dL — ABNORMAL HIGH (ref 0.57–1.00)
GFR calc Af Amer: 23 mL/min/{1.73_m2} — ABNORMAL LOW (ref >59)
GFR calc non Af Amer: 20 mL/min/{1.73_m2} — ABNORMAL LOW (ref >59)
Glucose: 81 mg/dL (ref 65–99)
Potassium: 4.2 mmol/L (ref 3.5–5.2)
Sodium: 137 mmol/L (ref 134–144)

## 2020-05-20 ENCOUNTER — Ambulatory Visit: Payer: Medicare Other | Admitting: Cardiovascular Disease

## 2020-05-27 ENCOUNTER — Ambulatory Visit: Payer: Medicare Other | Admitting: Family

## 2020-06-02 ENCOUNTER — Ambulatory Visit: Payer: Medicare Other | Admitting: Family

## 2020-06-16 ENCOUNTER — Ambulatory Visit: Payer: Medicare Other | Admitting: Family

## 2020-06-21 ENCOUNTER — Ambulatory Visit: Payer: Medicare Other | Admitting: Nurse Practitioner

## 2020-06-21 NOTE — Progress Notes (Deleted)
Office Visit    Patient Name: Leslie Houston Date of Encounter: 06/21/2020  Primary Care Provider:  Delsa Grana, PA-C Primary Cardiologist:  Ida Rogue, MD  Chief Complaint    ***  Past Medical History    Past Medical History:  Diagnosis Date  . (HFpEF) heart failure with preserved ejection fraction ([place])    a. Pt reports in 2000 she had CHF, details unclear, outside hospital; b. 06/2019 Echo: EF 50-55%, no rwma, Gr2 DD,  mildly dil LA, mild MR.  [name] CAD (coronary artery disease)    a. Pt reports in 2000 she had a stent to a coronary artery, details unclear, outside hospital.  . Carotid stenosis    a. CT angio head 07/2014 - 50-60% stenoses of prox ICA bilaterally.  . CKD (chronic kidney disease), stage III ([place])   . Diabetes mellitus ([place])    Denies.  . Edentulous    No upper teeth  . Family hx of colon cancer 06/13/2018   Father diagnosed in his 20's  . Gout   . HOH (hard of hearing)    usually needs to read lips  . Hyperlipidemia   . Hypertension   . Orthostatic hypotension   . Osteopenia 01/24/2018   DEXA Sept 2019  . PONV (postoperative nausea and vomiting)   . PTSD (post-traumatic stress disorder)       . PUD (peptic ulcer disease)   . PVC's (premature ventricular contractions)   . Seizures ([place])   . Sinus bradycardia   . Stroke [facility])    a. L PCA CVA in 05/2014.  [name] Syncope   . Weakness    left side S/P stroke   Past Surgical History:  Procedure Laterality Date  . BLADDER SURGERY    . CARDIAC CATHETERIZATION    . CESAREAN SECTION    . DENTAL SURGERY    . Heart stent    . stint      Allergies  Allergies  Allergen Reactions  . Latex Rash  . Penicillin G Itching  . Atorvastatin Other (See Comments)    Myalgias   . Other Hives    Adhesive tape    History of Present Illness    ***  Home Medications    Prior to Admission medications   Medication Sig Start Date End Date Taking? Authorizing Provider  amLODipine (NORVASC) 5 MG tablet  Take 5 mg by mouth daily. 05/03/20   [provider]  ASPIRIN LOW DOSE 81 MG EC tablet TAKE 1 TABLET BY MOUTH ONCE DAILY *SWALLOW WHOLE* 08/19/19   Suzzanne Cloud, NP  citalopram (CELEXA) 20 MG tablet Take 1 tablet once daily 03/04/20   Delsa Grana, PA-C  diclofenac sodium (VOLTAREN) 1 % GEL Apply 4 g topically 4 (four) times daily as needed. 07/07/19   Jennye Boroughs, MD  ezetimibe (ZETIA) 10 MG tablet Take 1 tablet (10 mg total) by mouth daily. 03/04/20   Delsa Grana, PA-C  fluticasone (FLONASE) 50 MCG/ACT nasal spray Place 2 sprays into both nostrils daily as needed for allergies (allergies).     [provider]  levETIRAcetam (KEPPRA) 500 MG tablet Take 1 in the morning, take 2 at night 01/20/20   Suzzanne Cloud, NP  loratadine (CLARITIN) 10 MG tablet Take 10 mg by mouth daily as needed for allergies.    [provider]  lovastatin (MEVACOR) 40 MG tablet Take 40 mg by mouth daily.    [provider]  tamsulosin (FLOMAX) 0.4 MG CAPS capsule  TAKE 1 CAPSULE BY MOUTH ONCE DAILY 01/29/20   Vaillancourt, Aldona Bar, PA-C  torsemide (DEMADEX) 20 MG tablet Take 1 tablet (20 mg total) by mouth daily. 05/12/20   Theora Gianotti, NP    Review of Systems    ***.  All other systems reviewed and are otherwise negative except as noted above.  Physical Exam    VS:  There were no vitals taken for this visit. , BMI There is no height or weight on file to calculate BMI. GEN: Well nourished, well developed, in no acute distress. HEENT: normal. Neck: Supple, no JVD, carotid bruits, or masses. Cardiac: RRR, no murmurs, rubs, or gallops. No clubbing, cyanosis, edema.  Radials/DP/PT 2+ and equal bilaterally.  Respiratory:  Respirations regular and unlabored, clear to auscultation bilaterally. GI: Soft, nontender, nondistended, BS + x 4. MS: no deformity or atrophy. Skin: warm and dry, no rash. Neuro:  Strength and sensation are intact. Psych: Normal affect.  Accessory  Clinical Findings    ECG personally reviewed by me today - *** - no acute changes.  Lab Results  Component Value Date   WBC 5.1 05/03/2020   HGB 11.2 (L) 05/03/2020   HCT 35.2 (L) 05/03/2020   MCV 100.3 (H) 05/03/2020   PLT 213 05/03/2020   Lab Results  Component Value Date   CREATININE 2.36 (H) 05/12/2020   BUN 34 (H) 05/12/2020   NA 137 05/12/2020   K 4.2 05/12/2020   CL 101 05/12/2020   CO2 25 05/12/2020   Lab Results  Component Value Date   ALT 9 05/03/2020   AST 13 (L) 05/03/2020   ALKPHOS 95 05/03/2020   BILITOT 0.7 05/03/2020   Lab Results  Component Value Date   CHOL 161 03/04/2020   HDL 76 03/04/2020   LDLCALC 72 03/04/2020   TRIG 53 03/04/2020   CHOLHDL 2.1 03/04/2020    Lab Results  Component Value Date   HGBA1C 4.8 01/07/2019    Assessment & Plan    1.  ***   Murray Hodgkins, NP 06/21/2020, 7:55 AM

## 2020-07-01 ENCOUNTER — Encounter: Payer: Self-pay | Admitting: Physician Assistant

## 2020-07-01 ENCOUNTER — Ambulatory Visit (INDEPENDENT_AMBULATORY_CARE_PROVIDER_SITE_OTHER): Payer: Medicare Other | Admitting: Medical

## 2020-07-01 ENCOUNTER — Other Ambulatory Visit: Payer: Self-pay

## 2020-07-01 ENCOUNTER — Other Ambulatory Visit
Admission: RE | Admit: 2020-07-01 | Discharge: 2020-07-01 | Disposition: A | Discharge status: Home / Self Care | Payer: Medicare Other | Source: Ambulatory Visit | Attending: Physician Assistant | Admitting: Physician Assistant

## 2020-07-01 VITALS — BP 148/70 | HR 50 | Ht 61.0 in | Wt 186.5 lb

## 2020-07-01 DIAGNOSIS — E782 Mixed hyperlipidemia: Secondary | ICD-10-CM | POA: Diagnosis not present

## 2020-07-01 DIAGNOSIS — N184 Chronic kidney disease, stage 4 (severe): Secondary | ICD-10-CM

## 2020-07-01 DIAGNOSIS — I5030 Unspecified diastolic (congestive) heart failure: Secondary | ICD-10-CM

## 2020-07-01 DIAGNOSIS — I10 Essential (primary) hypertension: Secondary | ICD-10-CM

## 2020-07-01 DIAGNOSIS — I2510 Atherosclerotic heart disease of native coronary artery without angina pectoris: Secondary | ICD-10-CM | POA: Diagnosis not present

## 2020-07-01 LAB — BASIC METABOLIC PANEL
Anion gap: 10 (ref 5–15)
BUN: 45 mg/dL — ABNORMAL HIGH (ref 8–23)
CO2: 25 mmol/L (ref 22–32)
Calcium: 9.7 mg/dL (ref 8.9–10.3)
Chloride: 104 mmol/L (ref 98–111)
Creatinine, Ser: 2.63 mg/dL — ABNORMAL HIGH (ref 0.44–1.00)
GFR, Estimated: 19 mL/min — ABNORMAL LOW (ref >60)
Glucose, Bld: 89 mg/dL (ref 70–99)
Potassium: 3.7 mmol/L (ref 3.5–5.1)
Sodium: 139 mmol/L (ref 135–145)

## 2020-07-01 MED ORDER — HYDRALAZINE HCL 10 MG PO TABS: 10.0000 mg | ORAL_TABLET | Freq: Three times a day (TID) | ORAL | 3 refills | Status: DC

## 2020-07-01 MED ORDER — TORSEMIDE 10 MG PO TABS: 10.0000 mg | ORAL_TABLET | Freq: Every day | ORAL | 3 refills | Status: DC

## 2020-07-01 MED ORDER — TORSEMIDE 10 MG PO TABS: 10.0000 mg | ORAL_TABLET | Freq: Every day | ORAL | 5 refills | Status: DC

## 2020-07-01 MED ORDER — HYDRALAZINE HCL 10 MG PO TABS: 10.0000 mg | ORAL_TABLET | Freq: Three times a day (TID) | ORAL | 5 refills | Status: DC

## 2020-07-01 NOTE — Addendum Note (Signed)
Addended by: Darlyne Russian on: 07/01/2020 04:35 PM   Modules accepted: Orders

## 2020-07-01 NOTE — Patient Instructions (Signed)
Medication Instructions:  Your physician has recommended you make the following change in your medication:   DECREASE Torsemide to 10mg  DAILY  START Hydralazine 10mg  THREE TIMES A DAY  *If you need a refill on your cardiac medications before your next appointment, please call your pharmacy*   Lab Work: Your physician recommends that you have lab work TODAY at the medical mall at [facility]: [place] -  Please go to the [facility]. You will check in at the front desk to the right as you walk into the atrium. Valet Parking is offered if needed. - No appointment needed. You may go any day between 7 am and 6 pm.    Testing/Procedures: None ordered    Follow-Up: At [facility], you and your health needs are our priority.  As part of our continuing mission to provide you with exceptional heart care, we have created designated Provider Care Teams.  These Care Teams include your primary Cardiologist (physician) and Advanced Practice Providers (APPs -  Physician Assistants and Nurse Practitioners) who all work together to provide you with the care you need, when you need it.  We recommend signing up for the patient portal called "MyChart".  Sign up information is provided on this After Visit Summary.  MyChart is used to connect with patients for Virtual Visits (Telemedicine).  Patients are able to view lab/test results, encounter notes, upcoming appointments, etc.  Non-urgent messages can be sent to your provider as well.   To learn more about what you can do with MyChart, go to [email or web address].    Your next appointment:   2 - 3 week(s)  The format for your next appointment:   In Person  Provider:   You may see Ida Rogue, MD or one of the following Advanced Practice Providers on your designated Care Team:    Murray Hodgkins, NP  Christell Faith, PA-C  Marrianne Mood, PA-C  Cadence Kathlen Mody, [place]  Laurann Montana, NP    Other Instructions   Heart-Healthy Eating  Plan Many factors influence your heart (coronary) health, including eating and exercise habits. Coronary risk increases with abnormal blood fat (lipid) levels. Heart-healthy meal planning includes limiting unhealthy fats, increasing healthy fats, and making other diet and lifestyle changes. What is my plan? Your health care provider may recommend that you:  Limit your fat intake.  Limit your saturated fat intake.  Limit the amount of cholesterol in your diet.  What are tips for following this plan? Cooking Cook foods using methods other than frying. Baking, boiling, grilling, and broiling are all good options. Other ways to reduce fat include:  Removing the skin from poultry.  Removing all visible fats from meats.  Steaming vegetables in water or broth. Meal planning  At meals, imagine dividing your plate into fourths: ? Fill one-half of your plate with vegetables and green salads. ? Fill one-fourth of your plate with whole grains. ? Fill one-fourth of your plate with lean protein foods.  Eat 4-5 servings of vegetables per day. One serving equals 1 cup raw or cooked vegetable, or 2 cups raw leafy greens.  Eat 4-5 servings of fruit per day. One serving equals 1 medium whole fruit,  cup dried fruit,  cup fresh, frozen, or canned fruit, or  cup 100% fruit juice.  Eat more foods that contain soluble fiber. Examples include apples, broccoli, carrots, beans, peas, and barley. Aim to get 25-30 g of fiber per day.  Increase your consumption of legumes, nuts, and seeds to 4-5  servings per week. One serving of dried beans or legumes equals  cup cooked, 1 serving of nuts is  cup, and 1 serving of seeds equals 1 tablespoon.   Fats  Choose healthy fats more often. Choose monounsaturated and polyunsaturated fats, such as olive and canola oils, flaxseeds, walnuts, almonds, and seeds.  Eat more omega-3 fats. Choose salmon, mackerel, sardines, tuna, flaxseed oil, and ground flaxseeds. Aim to  eat fish at least 2 times each week.  Check food labels carefully to identify foods with trans fats or high amounts of saturated fat.  Limit saturated fats. These are found in animal products, such as meats, butter, and cream. Plant sources of saturated fats include palm oil, palm kernel oil, and coconut oil.  Avoid foods with partially hydrogenated oils in them. These contain trans fats. Examples are stick margarine, some tub margarines, cookies, crackers, and other baked goods.  Avoid fried foods. General information  Eat more home-cooked food and less restaurant, buffet, and fast food.  Limit or avoid alcohol.  Limit foods that are high in starch and sugar.  Lose weight if you are overweight. Losing just 5-10% of your body weight can help your overall health and prevent diseases such as diabetes and heart disease.  Monitor your salt (sodium) intake, especially if you have high blood pressure. Talk with your health care provider about your sodium intake.  Try to incorporate more vegetarian meals weekly. What foods can I eat? Fruits All fresh, canned (in natural juice), or frozen fruits. Vegetables Fresh or frozen vegetables (raw, steamed, roasted, or grilled). Green salads. Grains Most grains. Choose whole wheat and whole grains most of the time. Rice and pasta, including brown rice and pastas made with whole wheat. Meats and other proteins Lean, well-trimmed beef, veal, pork, and lamb. Chicken and [place] without skin. All fish and shellfish. Wild duck, rabbit, pheasant, and venison. Egg whites or low-cholesterol egg substitutes. Dried beans, peas, lentils, and tofu. Seeds and most nuts. Dairy Low-fat or nonfat cheeses, including ricotta and mozzarella. Skim or 1% milk (liquid, powdered, or evaporated). Buttermilk made with low-fat milk. Nonfat or low-fat yogurt. Fats and oils Non-hydrogenated (trans-free) margarines. Vegetable oils, including soybean, sesame, sunflower, olive,  peanut, safflower, corn, canola, and cottonseed. Salad dressings or mayonnaise made with a vegetable oil. Beverages Water (mineral or sparkling). Coffee and tea. Diet carbonated beverages. Sweets and desserts Sherbet, gelatin, and fruit ice. Small amounts of dark chocolate. Limit all sweets and desserts. Seasonings and condiments All seasonings and condiments. The items listed above may not be a complete list of foods and beverages you can eat. Contact a dietitian for more options. What foods are not recommended? Fruits Canned fruit in heavy syrup. Fruit in cream or butter sauce. Fried fruit. Limit coconut. Vegetables Vegetables cooked in cheese, cream, or butter sauce. Fried vegetables. Grains Breads made with saturated or trans fats, oils, or whole milk. Croissants. Sweet rolls. Donuts. High-fat crackers, such as cheese crackers. Meats and other proteins Fatty meats, such as hot dogs, ribs, sausage, bacon, rib-eye roast or steak. High-fat deli meats, such as salami and bologna. Caviar. Domestic duck and goose. Organ meats, such as liver. Dairy Cream, sour cream, cream cheese, and creamed cottage cheese. Whole milk cheeses. Whole or 2% milk (liquid, evaporated, or condensed). Whole buttermilk. Cream sauce or high-fat cheese sauce. Whole-milk yogurt. Fats and oils Meat fat, or shortening. Cocoa butter, hydrogenated oils, palm oil, coconut oil, palm kernel oil. Solid fats and shortenings, including bacon fat, salt pork,  lard, and butter. Nondairy cream substitutes. Salad dressings with cheese or sour cream. Beverages Regular sodas and any drinks with added sugar. Sweets and desserts Frosting. Pudding. Cookies. Cakes. Pies. Milk chocolate or white chocolate. Buttered syrups. Full-fat ice cream or ice cream drinks. The items listed above may not be a complete list of foods and beverages to avoid. Contact a dietitian for more information. Summary  Heart-healthy meal planning includes  limiting unhealthy fats, increasing healthy fats, and making other diet and lifestyle changes.  Lose weight if you are overweight. Losing just 5-10% of your body weight can help your overall health and prevent diseases such as diabetes and heart disease.  Focus on eating a balance of foods, including fruits and vegetables, low-fat or nonfat dairy, lean protein, nuts and legumes, whole grains, and heart-healthy oils and fats. This information is not intended to replace advice given to you by your health care provider. Make sure you discuss any questions you have with your health care provider. Document Revised: 05/24/2017 Document Reviewed: 05/24/2017 Elsevier Patient Education  2021 [employer].

## 2020-07-01 NOTE — Progress Notes (Signed)
Cardiology Office Note:    Date:  07/01/2020   ID:  Leslie Houston, DOB Sep 26, 1950, MRN [id number]  PCP:  Delsa Grana, PA-C  [facility] Cardiologist:  Ida Rogue, MD  [place] Electrophysiologist:  None   Referring MD: Delsa Grana, PA-C   Chief Complaint: 2-3 week follow-up  History of Present Illness:    Leslie Houston is a 70 y.o. female with a hx of HFpEF, CAD, HTN, HLD, DM2, CKD stage 4, stroke, anemia fo chronic disease, orthostatic hypotension who presents for 3 week follow-up.   In the past patient previously underwent stenting in 2000, although details are unclear. In March 2021 she was admitted with HFpEF and worsening renal function. She was noted to be anemic. Echo showed EF 50-55% without any significant valvular abnormalities, G2DD. Sinus pause noted on telemetry, however this was felt to be vagal in etiology and resolved.  The patient was last seen 05/12/20 by cardiology for follow-up for volume overload with 10-12lbs weight gain on Torsemide. She was feeling better but still volume overloaded. She admitted to dietary indescretion and said she would try and correct this. Goal to get back to Torsemide 10mg  daily.   Today, she is accompanied by a caregiver. Pateint reports she has been doing a lot better. Swelling is drastically better. Weight is down to 4 lbs to 186lbs. Breathing is good. No chest pain. She has been staying away from salt, and made other diet changes. On exam she has trace lower leg edema. EKG without changes today. Prior labs reviewed. BP elevated again today. This morning it was 135/67. At home it is generally 130s/60s.   Past Medical History:  Diagnosis Date  . (HFpEF) heart failure with preserved ejection fraction ([place])    a. Pt reports in 2000 she had CHF, details unclear, outside hospital; b. 06/2019 Echo: EF 50-55%, no rwma, Gr2 DD,  mildly dil LA, mild MR.  [name] CAD (coronary artery disease)    a. Pt reports in 2000 she had a stent to a  coronary artery, details unclear, outside hospital.  . Carotid stenosis    a. CT angio head 07/2014 - 50-60% stenoses of prox ICA bilaterally.  . CKD (chronic kidney disease), stage III ([place])   . Diabetes mellitus ([place])    Denies.  . Edentulous    No upper teeth  . Family hx of colon cancer 06/13/2018   Father diagnosed in his 46's  . Gout   . HOH (hard of hearing)    usually needs to read lips  . Hyperlipidemia   . Hypertension   . Orthostatic hypotension   . Osteopenia 01/24/2018   DEXA Sept 2019  . PONV (postoperative nausea and vomiting)   . PTSD (post-traumatic stress disorder)       . PUD (peptic ulcer disease)   . PVC's (premature ventricular contractions)   . Seizures ([place])   . Sinus bradycardia   . Stroke [facility])    a. L PCA CVA in 05/2014.  [name] Syncope   . Weakness    left side S/P stroke    Past Surgical History:  Procedure Laterality Date  . BLADDER SURGERY    . CARDIAC CATHETERIZATION    . CESAREAN SECTION    . DENTAL SURGERY    . Heart stent    . stint      Current Medications: Current Meds  Medication Sig  . ASPIRIN LOW DOSE 81 MG EC tablet TAKE 1 TABLET BY MOUTH ONCE DAILY *SWALLOW WHOLE*  .  citalopram (CELEXA) 20 MG tablet Take 1 tablet once daily  . diclofenac sodium (VOLTAREN) 1 % GEL Apply 4 g topically 4 (four) times daily as needed.  . ezetimibe (ZETIA) 10 MG tablet Take 1 tablet (10 mg total) by mouth daily.  . fluticasone (FLONASE) 50 MCG/ACT nasal spray Place 2 sprays into both nostrils daily as needed for allergies (allergies).   . hydrALAZINE (APRESOLINE) 10 MG tablet Take 1 tablet (10 mg total) by mouth 3 (three) times daily.  [name] levETIRAcetam (KEPPRA) 500 MG tablet Take 1 in the morning, take 2 at night  . tamsulosin (FLOMAX) 0.4 MG CAPS capsule TAKE 1 CAPSULE BY MOUTH ONCE DAILY  . [DISCONTINUED] torsemide (DEMADEX) 20 MG tablet Take 1 tablet (20 mg total) by mouth daily.     Allergies:   Latex, Penicillin g, Atorvastatin, and Other    Social History   Socioeconomic History  . Marital status: Single    Spouse name: Not on file  . Number of children: 2  . Years of education: 18  . Highest education level: Not on file  Occupational History  . Occupation: Disabled  Tobacco Use  . Smoking status: Former Smoker    Quit date: 03/30/2014    Years since quitting: 6.2  . Smokeless tobacco: Never Used  Vaping Use  . Vaping Use: Never used  Substance and Sexual Activity  . Alcohol use: No  . Drug use: No  . Sexual activity: Not Currently  Other Topics Concern  . Not on file  Social History Narrative   Lives at home with her son's father.   Right-handed.   1 cup coffee per day.   Social Determinants of Health   Financial Resource Strain: Not on file  Food Insecurity: Not on file  Transportation Needs: Not on file  Physical Activity: Not on file  Stress: Not on file  Social Connections: Not on file     Family History: The patient's family history includes Cancer in her paternal grandfather; Colon cancer in her father; Diabetes Mellitus II in her daughter; Heart attack in her mother; Heart failure in her son; Kidney disease in her son; Lung cancer in her mother; Multiple sclerosis in her daughter; Prostate cancer in her father; Stroke in her father and mother.  ROS:   Please see the history of present illness.     All other systems reviewed and are negative.  EKGs/Labs/Other Studies Reviewed:    The following studies were reviewed today:  Echo 06/2019 1. Left ventricular ejection fraction, by estimation, is 50 to 55%. The  left ventricle has low normal function. The left ventricle has no regional  wall motion abnormalities. Left ventricular diastolic parameters are  consistent with Grade II diastolic  dysfunction (pseudonormalization).  2. Right ventricular systolic function is normal. The right ventricular  size is normal. Tricuspid regurgitation signal is inadequate for assessing  PA pressure.  3.  Left atrial size was mildly dilated.  4. The mitral valve is degenerative. Mild mitral valve regurgitation. No  evidence of mitral stenosis.  5. The aortic valve is tricuspid. Aortic valve regurgitation is not  visualized. No aortic stenosis is present.  6. The inferior vena cava is normal in size with <50% respiratory  variability, suggesting right atrial pressure of 8 mmHg.   EKG:  EKG is ordered today.  The ekg ordered today demonstrates SB, 52bpm, first degree AV block.   Recent Labs: 05/03/2020: ALT 9; Hemoglobin 11.2; Platelets 213 05/12/2020: BUN 34; Creatinine, Ser 2.36;  Potassium 4.2; Sodium 137  Recent Lipid Panel    Component Value Date/Time   CHOL 161 03/04/2020 1613   TRIG 53 03/04/2020 1613   HDL 76 03/04/2020 1613   CHOLHDL 2.1 03/04/2020 1613   VLDL 9 11/19/2016 1141   LDLCALC 72 03/04/2020 1613     Physical Exam:    VS:  BP (!) 148/70 (BP Location: Left Arm, Patient Position: Sitting, Cuff Size: Large)   Pulse (!) 50   Ht 5\' 1"  (1.549 m)   Wt 186 lb 8 oz (84.6 kg)   SpO2 98%   BMI 35.24 kg/m     Wt Readings from Last 3 Encounters:  07/01/20 186 lb 8 oz (84.6 kg)  05/12/20 190 lb (86.2 kg)  05/03/20 196 lb (88.9 kg)     GEN:  Well nourished, well developed in no acute distress HEENT: Normal NECK: No JVD; No carotid bruits LYMPHATICS: No lymphadenopathy CARDIAC: RRR, no murmurs, rubs, gallops RESPIRATORY:  Clear to auscultation without rales, wheezing or rhonchi  ABDOMEN: Soft, non-tender, non-distended MUSCULOSKELETAL:  Trace edema; No deformity  SKIN: Warm and dry NEUROLOGIC:  Alert and oriented x 3 PSYCHIATRIC:  Normal affect   ASSESSMENT:    1. Heart failure with preserved ejection fraction, unspecified HF chronicity ([place])   2. Coronary artery disease involving native coronary artery of native heart without angina pectoris   3. Essential hypertension   4. Mixed hyperlipidemia   5. CKD (chronic kidney disease), stage IV ([place])   6.  Essential hypertension    PLAN:    In order of problems listed above:  HFpEF Weight is down 4 lbs from prior visit. The patient has made drastic diet changes. She has been taking 20mg  daily. Repeat BMET today. Goal to get back to torsemide 10mg  daily. Trace edema on exam, no JVD appreciated, and lungs clear. Given dietary changes and weight loss I think it's okay to decrease to 10mg  daily. CHF education reviewed. We will see her back in 2-3 weeks to make sure volume status is stable.   HTN BP elevated again today. BP at home 130s/60. Would like a little more BP control, especially given CKD and decreased Torsemide dose. Will start Hydrazine 10 mg TID. She will continue to take Bps daily and we will see her back in follow-up.  CKD stage 4 Creatinine relatively stable on last labs. BMET today.   CAD Remote stenting history. Denies anginal symptoms.   HLD LDL 72, continue statin and zetia  Disposition: Follow up in 2 week(s) with APP    Signed, Dorea Duff Ninfa Meeker, PA-C  07/01/2020 4:31 PM    [facility]

## 2020-07-04 ENCOUNTER — Ambulatory Visit: Payer: Medicare Other | Admitting: Family Medicine

## 2020-07-04 NOTE — Addendum Note (Signed)
Addended by: Britt Bottom on: 07/04/2020 08:31 AM   Modules accepted: Orders

## 2020-07-19 ENCOUNTER — Ambulatory Visit: Payer: Medicare Other | Admitting: Physician Assistant

## 2020-07-22 ENCOUNTER — Ambulatory Visit: Payer: Medicare Other | Admitting: Physician Assistant

## 2020-07-25 ENCOUNTER — Other Ambulatory Visit: Payer: Self-pay

## 2020-07-25 ENCOUNTER — Ambulatory Visit (INDEPENDENT_AMBULATORY_CARE_PROVIDER_SITE_OTHER): Payer: Medicare Other | Admitting: Physician Assistant

## 2020-07-25 ENCOUNTER — Encounter: Payer: Self-pay | Admitting: Physician Assistant

## 2020-07-25 VITALS — BP 156/81 | HR 54 | Ht 61.0 in | Wt 191.8 lb

## 2020-07-25 DIAGNOSIS — R339 Retention of urine, unspecified: Secondary | ICD-10-CM | POA: Diagnosis not present

## 2020-07-25 DIAGNOSIS — N319 Neuromuscular dysfunction of bladder, unspecified: Secondary | ICD-10-CM

## 2020-07-25 LAB — BLADDER SCAN AMB NON-IMAGING: Scan Result: 57

## 2020-07-25 MED ORDER — TAMSULOSIN HCL 0.4 MG PO CAPS: 0.4000 mg | ORAL_CAPSULE | Freq: Every day | ORAL | 3 refills | Status: DC

## 2020-07-25 NOTE — Progress Notes (Signed)
07/25/2020 1:57 PM   Leslie Houston 1950-12-26 [id number]  CC: Chief Complaint  Patient presents with  . Urinary Retention    HPI: Leslie Houston is a 70 y.o. female with PMH CKD and incomplete bladder emptying who presents today for symptom recheck and PVR on Flomax.  She is accompanied today by her caregiver, [name], who contributes to HPI.  Today she reports doing well.  She denies dysuria or gross hematuria.  She continues Flomax and denies dizziness or lightheadedness on this medication.  PVR 57 mL.  Patient experienced a CHF exacerbation early this year and has been on increased torsemide.  She has had close follow-up with cardiology since due to her history of CKD.  Creatinine most recently stable at 2.63 on 07/01/2020.  PMH: Past Medical History:  Diagnosis Date  . (HFpEF) heart failure with preserved ejection fraction ([place])    a. Pt reports in 2000 she had CHF, details unclear, outside hospital; b. 06/2019 Echo: EF 50-55%, no rwma, Gr2 DD,  mildly dil LA, mild MR.  [name] CAD (coronary artery disease)    a. Pt reports in 2000 she had a stent to a coronary artery, details unclear, outside hospital.  . Carotid stenosis    a. CT angio head 07/2014 - 50-60% stenoses of prox ICA bilaterally.  . CKD (chronic kidney disease), stage III ([place])   . Diabetes mellitus ([place])    Denies.  . Edentulous    No upper teeth  . Family hx of colon cancer 06/13/2018   Father diagnosed in his 19's  . Gout   . HOH (hard of hearing)    usually needs to read lips  . Hyperlipidemia   . Hypertension   . Orthostatic hypotension   . Osteopenia 01/24/2018   DEXA Sept 2019  . PONV (postoperative nausea and vomiting)   . PTSD (post-traumatic stress disorder)       . PUD (peptic ulcer disease)   . PVC's (premature ventricular contractions)   . Seizures ([place])   . Sinus bradycardia   . Stroke [facility])    a. L PCA CVA in 05/2014.  [name] Syncope   . Weakness    left side S/P stroke    Surgical  History: Past Surgical History:  Procedure Laterality Date  . BLADDER SURGERY    . CARDIAC CATHETERIZATION    . CESAREAN SECTION    . DENTAL SURGERY    . Heart stent    . stint      Home Medications:  Allergies as of 07/25/2020      Reactions   Latex Rash   Penicillin G Itching   Atorvastatin Other (See Comments)   Myalgias    Other Hives   Adhesive tape      Medication List       Accurate as of July 25, 2020  1:57 PM. If you have any questions, ask your nurse or doctor.        Aspirin Low Dose 81 MG EC tablet Generic drug: aspirin TAKE 1 TABLET BY MOUTH ONCE DAILY *SWALLOW WHOLE*   citalopram 20 MG tablet Commonly known as: CELEXA Take 1 tablet once daily   diclofenac sodium 1 % Gel Commonly known as: VOLTAREN Apply 4 g topically 4 (four) times daily as needed.   ezetimibe 10 MG tablet Commonly known as: ZETIA Take 1 tablet (10 mg total) by mouth daily.   fluticasone 50 MCG/ACT nasal spray Commonly known as: FLONASE Place 2 sprays into both nostrils daily as  needed for allergies (allergies).   hydrALAZINE 10 MG tablet Commonly known as: APRESOLINE Take 1 tablet (10 mg total) by mouth 3 (three) times daily.   levETIRAcetam 500 MG tablet Commonly known as: KEPPRA Take 1 in the morning, take 2 at night   lovastatin 40 MG tablet Commonly known as: MEVACOR Take 40 mg by mouth daily.   tamsulosin 0.4 MG Caps capsule Commonly known as: FLOMAX Take 1 capsule (0.4 mg total) by mouth daily.   torsemide 10 MG tablet Commonly known as: DEMADEX Take 1 tablet (10 mg total) by mouth daily.       Allergies:  Allergies  Allergen Reactions  . Latex Rash  . Penicillin G Itching  . Atorvastatin Other (See Comments)    Myalgias   . Other Hives    Adhesive tape    Family History: Family History  Problem Relation Age of Onset  . Stroke Mother   . Heart attack Mother   . Lung cancer Mother   . Stroke Father   . Prostate cancer Father   . Colon  cancer Father   . Diabetes Mellitus II Daughter   . Multiple sclerosis Daughter   . Kidney disease Son   . Heart failure Son   . Cancer Paternal Grandfather     Social History:   reports that she quit smoking about 6 years ago. She has never used smokeless tobacco. She reports that she does not drink alcohol and does not use drugs.  Physical Exam: BP (!) 156/81   Pulse (!) 54   Ht 5\' 1"  (1.549 m)   Wt 191 lb 12.8 oz (87 kg)   BMI 36.24 kg/m   Constitutional:  Alert and oriented, no acute distress, nontoxic appearing HEENT: [place], AT Cardiovascular: No clubbing, cyanosis, or edema Respiratory: Normal respiratory effort, no increased work of breathing Skin: No rashes, bruises or suspicious lesions Neurologic: Grossly intact, no focal deficits, moving all 4 extremities Psychiatric: Normal mood and affect  Laboratory Data: Results for orders placed or performed in visit on 07/25/20  Bladder Scan (Post Void Residual) in office  Result Value Ref Range   Scan Result 57    Assessment & Plan:   1. Neuromuscular dysfunction of bladder, unspecified  PVR WNL today.  Counseled her to continue Flomax.  Patient refers to follow-up every 6 months.  I think this is appropriate.  We will plan for renal ultrasound prior. - Bladder Scan (Post Void Residual) in office - US RENAL; Future - tamsulosin (FLOMAX) 0.4 MG CAPS capsule; Take 1 capsule (0.4 mg total) by mouth daily.  Dispense: 90 capsule; Refill: 3  Return in about 6 weeks (around 09/05/2020) for Symptom recheck with PVR and RUS prior.  Debroah Loop, PA-C  [facility] [address] [place], [place] [address] [phone]

## 2020-07-29 ENCOUNTER — Ambulatory Visit: Payer: Medicare Other | Admitting: Family

## 2020-08-02 ENCOUNTER — Other Ambulatory Visit: Payer: Self-pay

## 2020-08-02 ENCOUNTER — Ambulatory Visit (INDEPENDENT_AMBULATORY_CARE_PROVIDER_SITE_OTHER): Payer: Medicare Other | Admitting: Physician Assistant

## 2020-08-02 ENCOUNTER — Ambulatory Visit: Payer: Medicare Other

## 2020-08-02 ENCOUNTER — Encounter: Payer: Self-pay | Admitting: Physician Assistant

## 2020-08-02 VITALS — BP 120/78 | HR 68 | Temp 98.1°F | Resp 14 | Ht 63.0 in | Wt 194.4 lb

## 2020-08-02 DIAGNOSIS — D367 Benign neoplasm of other specified sites: Secondary | ICD-10-CM | POA: Diagnosis not present

## 2020-08-02 DIAGNOSIS — I509 Heart failure, unspecified: Secondary | ICD-10-CM | POA: Diagnosis not present

## 2020-08-02 DIAGNOSIS — N183 Chronic kidney disease, stage 3 unspecified: Secondary | ICD-10-CM

## 2020-08-02 MED ORDER — DOXYCYCLINE HYCLATE 100 MG PO TABS: 100.0000 mg | ORAL_TABLET | Freq: Two times a day (BID) | ORAL | 0 refills | Status: AC

## 2020-08-02 NOTE — Patient Instructions (Signed)
Would recommend increasing torsemide to 20 mg daily.

## 2020-08-02 NOTE — Progress Notes (Signed)
Established patient visit   Patient: Leslie Houston   DOB: 08-01-1950   70 y.o. Female  MRN: [id number] Visit Date: 08/02/2020  Today's healthcare provider: Trinna Post, PA-C   Chief Complaint  Patient presents with  . Follow-up  . knots    On bilateral arms that are painful   Subjective    HPI HPI    knots    Comments: On bilateral arms that are painful       Last edited by Cathrine Muster, [place] on 08/02/2020  2:06 PM. (History)      Patient wit history of CHF and CKD. Was decreased from her 20 mg of her torsemide to 10 mg torsemide daily on 07/01/2020. Since then she has gained 8 lbs.  Wt Readings from Last 3 Encounters:  08/02/20 194 lb 6.4 oz (88.2 kg)  07/25/20 191 lb 12.8 oz (87 kg)  07/01/20 186 lb 8 oz (84.6 kg)   She also reports a cystic lesion on her left elbow. She reports this has been there several weeks and is tender. Denies fevers, chills, nausea, vomiting. Denies drainage from the area.      Medications: Outpatient Medications Prior to Visit  Medication Sig  . ASPIRIN LOW DOSE 81 MG EC tablet TAKE 1 TABLET BY MOUTH ONCE DAILY *SWALLOW WHOLE*  . citalopram (CELEXA) 20 MG tablet Take 1 tablet once daily  . diclofenac sodium (VOLTAREN) 1 % GEL Apply 4 g topically 4 (four) times daily as needed.  . ezetimibe (ZETIA) 10 MG tablet Take 1 tablet (10 mg total) by mouth daily.  . fluticasone (FLONASE) 50 MCG/ACT nasal spray Place 2 sprays into both nostrils daily as needed for allergies (allergies).   . hydrALAZINE (APRESOLINE) 10 MG tablet Take 1 tablet (10 mg total) by mouth 3 (three) times daily.  [name] levETIRAcetam (KEPPRA) 500 MG tablet Take 1 in the morning, take 2 at night  . lovastatin (MEVACOR) 40 MG tablet Take 40 mg by mouth daily.  . tamsulosin (FLOMAX) 0.4 MG CAPS capsule Take 1 capsule (0.4 mg total) by mouth daily.  [name] torsemide (DEMADEX) 10 MG tablet Take 1 tablet (10 mg total) by mouth daily.   No facility-administered medications prior to  visit.    Review of Systems     Objective    BP 120/78   Pulse 68   Temp 98.1 F (36.7 C)   Resp 14   Ht 5\' 3"  (1.6 m)   Wt 194 lb 6.4 oz (88.2 kg)   SpO2 97%   BMI 34.44 kg/m     Physical Exam Constitutional:      Appearance: Normal appearance. She is obese.  Cardiovascular:     Rate and Rhythm: Normal rate and regular rhythm.     Heart sounds: Normal heart sounds.  Pulmonary:     Effort: Pulmonary effort is normal.     Breath sounds: Normal breath sounds.  Musculoskeletal:     Comments: Large palpable cystic lesion overlying left elbow with some smaller overlying erythematous lesions.   Skin:    General: Skin is warm and dry.  Neurological:     Mental Status: She is alert and oriented to person, place, and time. Mental status is at baseline.  Psychiatric:        Mood and Affect: Mood normal.        Behavior: Behavior normal.       No results found for any visits on 08/02/20.  Assessment & Plan  1. Dermoid cyst of upper extremity, unspecified laterality  Larger cystic mass deeper with more superficial cystic lesions. Cover for infection and refer to derm.   - doxycycline (VIBRA-TABS) 100 MG tablet; Take 1 tablet (100 mg total) by mouth 2 (two) times daily for 7 days.  Dispense: 14 tablet; Refill: 0 - Ambulatory referral to Dermatology  2. Acute congestive heart failure, unspecified heart failure type (HCC)   3. Stage 3 chronic kidney disease, unspecified whether stage 3a or 3b CKD ([place])   4. Acute on chronic congestive heart failure, unspecified heart failure type ([place])  Recommend she resume torsemide 20 mg daily until her cardiology follow up.   - Comprehensive Metabolic Panel (CMET)   No follow-ups on file.         Trinna Post, PA-C  [facility] [phone] (phone) [phone] (fax)  [place]

## 2020-08-03 ENCOUNTER — Telehealth: Payer: Self-pay

## 2020-08-03 LAB — COMPREHENSIVE METABOLIC PANEL
AG Ratio: 1.2 (calc) (ref 1.0–2.5)
ALT: 9 U/L (ref 6–29)
AST: 12 U/L (ref 10–35)
Albumin: 3.8 g/dL (ref 3.6–5.1)
Alkaline phosphatase (APISO): 104 U/L (ref 37–153)
BUN/Creatinine Ratio: 21 (calc) (ref 6–22)
BUN: 49 mg/dL — ABNORMAL HIGH (ref 7–25)
CO2: 24 mmol/L (ref 20–32)
Calcium: 9.4 mg/dL (ref 8.6–10.4)
Chloride: 108 mmol/L (ref 98–110)
Creat: 2.39 mg/dL — ABNORMAL HIGH (ref 0.60–0.93)
Globulin: 3.3 g/dL (calc) (ref 1.9–3.7)
Glucose, Bld: 81 mg/dL (ref 65–99)
Potassium: 4.8 mmol/L (ref 3.5–5.3)
Sodium: 142 mmol/L (ref 135–146)
Total Bilirubin: 0.4 mg/dL (ref 0.2–1.2)
Total Protein: 7.1 g/dL (ref 6.1–8.1)

## 2020-08-03 NOTE — Telephone Encounter (Signed)
Copied from [place] [id number] [id number]. Topic: General - Other >> Aug 03, 2020 10:29 AM Keene Breath wrote: Reason for CRM: Patient's caregiver would like  a call back when patient's blood work comes back.  CB# [phone]

## 2020-08-03 NOTE — Telephone Encounter (Signed)
Care giver notified.

## 2020-08-05 DIAGNOSIS — L920 Granuloma annulare: Secondary | ICD-10-CM | POA: Diagnosis not present

## 2020-08-05 DIAGNOSIS — D485 Neoplasm of uncertain behavior of skin: Secondary | ICD-10-CM | POA: Diagnosis not present

## 2020-08-10 ENCOUNTER — Ambulatory Visit (INDEPENDENT_AMBULATORY_CARE_PROVIDER_SITE_OTHER): Payer: Medicare Other | Admitting: Family

## 2020-08-10 ENCOUNTER — Other Ambulatory Visit: Payer: Self-pay

## 2020-08-10 ENCOUNTER — Encounter (INDEPENDENT_AMBULATORY_CARE_PROVIDER_SITE_OTHER): Payer: Self-pay

## 2020-08-10 ENCOUNTER — Encounter: Payer: Self-pay | Admitting: Family

## 2020-08-10 VITALS — BP 130/62 | HR 68 | Ht 63.0 in | Wt 195.0 lb

## 2020-08-10 DIAGNOSIS — N184 Chronic kidney disease, stage 4 (severe): Secondary | ICD-10-CM

## 2020-08-10 DIAGNOSIS — I25118 Atherosclerotic heart disease of native coronary artery with other forms of angina pectoris: Secondary | ICD-10-CM | POA: Diagnosis not present

## 2020-08-10 DIAGNOSIS — I10 Essential (primary) hypertension: Secondary | ICD-10-CM | POA: Diagnosis not present

## 2020-08-10 DIAGNOSIS — I5030 Unspecified diastolic (congestive) heart failure: Secondary | ICD-10-CM

## 2020-08-10 MED ORDER — TORSEMIDE 20 MG PO TABS: 20.0000 mg | ORAL_TABLET | Freq: Every day | ORAL | 1 refills | Status: DC

## 2020-08-10 MED ORDER — HYDRALAZINE HCL 10 MG PO TABS: 10.0000 mg | ORAL_TABLET | Freq: Two times a day (BID) | ORAL | 1 refills | Status: DC

## 2020-08-10 NOTE — Progress Notes (Signed)
Office Visit    Patient Name: Leslie Houston Date of Encounter: 08/10/2020  PCP:  Delsa Grana, [place]  Cardiologist:  Ida Rogue, MD  Advanced Practice Provider:  No care team member to display Electrophysiologist:  None    Chief Complaint    Leslie Houston is a 70 y.o. female with a hx of hypertension, CVA, HFpEF, coronary artery disease, hyperlipidemia, DM2, CKD 4, obstructive uropathy, stroke, anemia of chronic disease, orthostatic hypotension presents today for follow up of heart failure.   Past Medical History    Past Medical History:  Diagnosis Date  . (HFpEF) heart failure with preserved ejection fraction ([place])    a. Pt reports in 2000 she had CHF, details unclear, outside hospital; b. 06/2019 Echo: EF 50-55%, no rwma, Gr2 DD,  mildly dil LA, mild MR.  [name] CAD (coronary artery disease)    a. Pt reports in 2000 she had a stent to a coronary artery, details unclear, outside hospital.  . Carotid stenosis    a. CT angio head 07/2014 - 50-60% stenoses of prox ICA bilaterally.  . CKD (chronic kidney disease), stage III ([place])   . Diabetes mellitus ([place])    Denies.  . Edentulous    No upper teeth  . Family hx of colon cancer 06/13/2018   Father diagnosed in his 55's  . Gout   . HOH (hard of hearing)    usually needs to read lips  . Hyperlipidemia   . Hypertension   . Orthostatic hypotension   . Osteopenia 01/24/2018   DEXA Sept 2019  . PONV (postoperative nausea and vomiting)   . PTSD (post-traumatic stress disorder)       . PUD (peptic ulcer disease)   . PVC's (premature ventricular contractions)   . Seizures ([place])   . Sinus bradycardia   . Stroke [facility])    a. L PCA CVA in 05/2014.  [name] Syncope   . Weakness    left side S/P stroke   Past Surgical History:  Procedure Laterality Date  . BLADDER SURGERY    . CARDIAC CATHETERIZATION    . CESAREAN SECTION    . DENTAL SURGERY    . Heart stent    . stint       Allergies  Allergies  Allergen Reactions  . Latex Rash  . Penicillin G Itching  . Atorvastatin Other (See Comments)    Myalgias   . Other Hives    Adhesive tape    History of Present Illness    Leslie Houston is a 70 y.o. female with a hx of  hypertension, CVA, HFpEF, coronary artery disease, hyperlipidemia, DM2, CKD 4, obstructive uropathy, stroke, anemia of chronic disease, orthostatic hypotension last seen 07/01/2020 by Tarri Glenn, PA.  Patient underwent coronary stenting in 2000 although details unclear.  March 2021 when she was admitted with HFpEF and worsening renal function and noted to be anemic.  Echo with LVEF 50 to 55%, no significant valvular abnormalities, grade 2 diastolic dysfunction.  Sinus pause noted on telemetry however this was felt to be vagal in etiology and self resolved. She was seen in clinic 05/07/2020 noted to be volume up 10 to 12 pounds.  Her torsemide was increased to 40 mg for 5 days and then 20 mg daily.  Her amlodipine was discontinued due to edema.   She was seen in follow-up 07/01/2020 with weight down 4 pounds after dramatic diet changes.  She had trace edema on exam.  Her torsemide was reduced to 10 mg daily.  She was also started on hydralazine 10 mg 3 times daily for mildly elevated blood pressure.  At PCP visit 08/02/2020 she was recommended to increase torsemide back to 20 mg daily.  Lab work with creatinine 2.39.  She presents today for follow-up with her friend/caretaker.  Compared to clinic visit 6 weeks ago her weight is up 9 pounds. She has continued to take Torsemide 10mg  daily. She reports drinking a low salt diet. Unclear how much fluid she is taking in each day. Reports home BP this morning 110/45 with occasional lightheadedness. No chest pain, pressure, tightness. Endorses dyspnea on exertion and lower extremity edema. Reports no orthopnea, PND.  EKGs/Labs/Other Studies Reviewed:   The following studies were reviewed today:  EKG:  No  EKG today.  Recent Labs: 05/03/2020: Hemoglobin 11.2; Platelets 213 08/02/2020: ALT 9; BUN 49; Creat 2.39; Potassium 4.8; Sodium 142  Recent Lipid Panel    Component Value Date/Time   CHOL 161 03/04/2020 1613   TRIG 53 03/04/2020 1613   HDL 76 03/04/2020 1613   CHOLHDL 2.1 03/04/2020 1613   VLDL 9 11/19/2016 1141   LDLCALC 72 03/04/2020 1613    Home Medications   Current Meds  Medication Sig  . ASPIRIN LOW DOSE 81 MG EC tablet TAKE 1 TABLET BY MOUTH ONCE DAILY *SWALLOW WHOLE*  . citalopram (CELEXA) 20 MG tablet Take 1 tablet once daily  . diclofenac sodium (VOLTAREN) 1 % GEL Apply 4 g topically 4 (four) times daily as needed.  . ezetimibe (ZETIA) 10 MG tablet Take 1 tablet (10 mg total) by mouth daily.  . fluticasone (FLONASE) 50 MCG/ACT nasal spray Place 2 sprays into both nostrils daily as needed for allergies (allergies).   . hydrALAZINE (APRESOLINE) 10 MG tablet Take 1 tablet (10 mg total) by mouth 3 (three) times daily.  [name] levETIRAcetam (KEPPRA) 500 MG tablet Take 1 in the morning, take 2 at night  . lovastatin (MEVACOR) 40 MG tablet Take 40 mg by mouth daily.  . tamsulosin (FLOMAX) 0.4 MG CAPS capsule Take 1 capsule (0.4 mg total) by mouth daily.     Review of Systems  All other systems reviewed and are otherwise negative except as noted above.  Physical Exam    VS:  Pulse 68   Ht 5\' 3"  (1.6 m)   Wt 195 lb (88.5 kg)   SpO2 96%   BMI 34.54 kg/m  , BMI Body mass index is 34.54 kg/m.  Wt Readings from Last 3 Encounters:  08/10/20 195 lb (88.5 kg)  08/02/20 194 lb 6.4 oz (88.2 kg)  07/25/20 191 lb 12.8 oz (87 kg)   GEN: Well nourished, well developed, in no acute distress. HEENT: normal. Neck: Supple, no JVD, carotid bruits, or masses. Cardiac: RRR, no murmurs, rubs, or gallops. No clubbing, cyanosis. Trace pretibial edema bilaterally.  Radials/PT 2+ and equal bilaterally.  Respiratory:  Respirations regular and unlabored, clear to auscultation bilaterally. GI:  Soft, nontender, nondistended. MS: No deformity or atrophy. Skin: Warm and dry, no rash. Neuro:  Strength and sensation are intact. Psych: Normal affect.  Assessment & Plan    1. HFpEF- weight up 9 pounds. Unclear fluid intake though reports low salt diet. Increase Torsemide to 20mg  daily with BNP, BMP in 1 week. Monitor renal function carefully in the setting of CKD. Educated on daily weights and to report weight gain of 2 pounds overnight or 5 pounds in one week. If volume status remains  difficult to control, consider updated echocardiogram.   2. CAD- Stable with no anginal symptoms. No indication for ischemic evaluation. GDMT includes aspirin, zetia, lovastatin. Heart healthy diet and regular cardiovascular exercise encouraged.  3. Hypertension- Caretaker reports she has some white coat hypertension. Reports hypotensive BP readings at home and some lightheadedness. Reduce Hydralazine to 10mg  BID.   4. CKD IV- Careful titration of diuretics and antihypertensives.   5. Hyperlipidemia, LDL goal less than 70- Continue Zetia 10mg  daily, Lovastatin 40mg  daily.   6. Obstructive uropathy- Careful monitoring of CKD. Continue to follow with urology.  Disposition: Follow up in 2 week(s) with Dr. Rockey Situ or APP  Signed, Loel Dubonnet, NP 08/10/2020, 3:48 PM [place]

## 2020-08-10 NOTE — Patient Instructions (Addendum)
Medication Instructions:  Your physician has recommended you make the following change in your medication:   CHANGE Hydralazine to 10mg  (one tablet) twice per day  CHANGE Torsemide to 20mg  (one tablet) once per day  *If you need a refill on your cardiac medications before your next appointment, please call your pharmacy*  Lab Work: Your physician recommends that you return for lab work in 1 week at [facility] for BMP, BNP.  Medical Mall Entrance at [facility] 1st desk on the right to check in, past the screening table Lab hours: Monday- Friday (7:30 am- 5:30 pm)  If you have labs (blood work) drawn today and your tests are completely normal, you will receive your results only by: [name] MyChart Message (if you have MyChart) OR . A paper copy in the mail If you have any lab test that is abnormal or we need to change your treatment, we will call you to review the results.  Testing/Procedures: None ordered today.  Follow-Up: At [facility], you and your health needs are our priority.  As part of our continuing mission to provide you with exceptional heart care, we have created designated Provider Care Teams.  These Care Teams include your primary Cardiologist (physician) and Advanced Practice Providers (APPs -  Physician Assistants and Nurse Practitioners) who all work together to provide you with the care you need, when you need it.  We recommend signing up for the patient portal called "MyChart".  Sign up information is provided on this After Visit Summary.  MyChart is used to connect with patients for Virtual Visits (Telemedicine).  Patients are able to view lab/test results, encounter notes, upcoming appointments, etc.  Non-urgent messages can be sent to your provider as well.   To learn more about what you can do with MyChart, go to [email or web address].    Your next appointment:   2 week(s)  The format for your next appointment:   In Person  Provider:   Laurann Montana,  NP  Other Instructions  Heart Healthy Diet Recommendations: A low-salt diet is recommended. Meats should be grilled, baked, or boiled. Avoid fried foods. Focus on lean protein sources like fish or chicken with vegetables and fruits. The American Heart Association is a Microbiologist!  American Heart Association Diet and Lifeystyle Recommendations   Exercise recommendations: The American Heart Association recommends 150 minutes of moderate intensity exercise weekly. Try 30 minutes of moderate intensity exercise 4-5 times per week. This could include walking, jogging, or swimming.  Recommend weighing daily and keeping a log. Please call our office if you have weight gain of 2 pounds overnight or 5 pounds in 1 week.   Date  Time Weight                                           Date  Time Weight

## 2020-08-25 ENCOUNTER — Ambulatory Visit: Payer: Medicare Other | Admitting: Family

## 2020-08-29 DIAGNOSIS — I10 Essential (primary) hypertension: Secondary | ICD-10-CM | POA: Diagnosis not present

## 2020-08-29 DIAGNOSIS — N184 Chronic kidney disease, stage 4 (severe): Secondary | ICD-10-CM | POA: Diagnosis not present

## 2020-08-29 DIAGNOSIS — R600 Localized edema: Secondary | ICD-10-CM | POA: Diagnosis not present

## 2020-08-31 ENCOUNTER — Encounter: Payer: Self-pay | Admitting: Family

## 2020-08-31 ENCOUNTER — Ambulatory Visit (INDEPENDENT_AMBULATORY_CARE_PROVIDER_SITE_OTHER): Payer: Medicare Other | Admitting: Family

## 2020-08-31 ENCOUNTER — Other Ambulatory Visit: Payer: Self-pay

## 2020-08-31 VITALS — BP 122/58 | HR 57 | Ht 63.0 in | Wt 193.0 lb

## 2020-08-31 DIAGNOSIS — I5030 Unspecified diastolic (congestive) heart failure: Secondary | ICD-10-CM | POA: Diagnosis not present

## 2020-08-31 DIAGNOSIS — N184 Chronic kidney disease, stage 4 (severe): Secondary | ICD-10-CM

## 2020-08-31 DIAGNOSIS — I25118 Atherosclerotic heart disease of native coronary artery with other forms of angina pectoris: Secondary | ICD-10-CM | POA: Diagnosis not present

## 2020-08-31 DIAGNOSIS — I10 Essential (primary) hypertension: Secondary | ICD-10-CM

## 2020-08-31 NOTE — Patient Instructions (Addendum)
Medication Instructions:  Continue your current medications.   *If you need a refill on your cardiac medications before your next appointment, please call your pharmacy*  Lab Work: Your physician recommends lab work today: BMP  If you have labs (blood work) drawn today and your tests are completely normal, you will receive your results only by: [name] MyChart Message (if you have MyChart) OR . A paper copy in the mail If you have any lab test that is abnormal or we need to change your treatment, we will call you to review the results.  Testing/Procedures: None ordered today.    Follow-Up: At [facility], you and your health needs are our priority.  As part of our continuing mission to provide you with exceptional heart care, we have created designated Provider Care Teams.  These Care Teams include your primary Cardiologist (physician) and Advanced Practice Providers (APPs -  Physician Assistants and Nurse Practitioners) who all work together to provide you with the care you need, when you need it.  We recommend signing up for the patient portal called "MyChart".  Sign up information is provided on this After Visit Summary.  MyChart is used to connect with patients for Virtual Visits (Telemedicine).  Patients are able to view lab/test results, encounter notes, upcoming appointments, etc.  Non-urgent messages can be sent to your provider as well.   To learn more about what you can do with MyChart, go to [email or web address].    Your next appointment:   2-3 month(s)  The format for your next appointment:   In Person  Provider:   You may see Ida Rogue, MD or one of the following Advanced Practice Providers on your designated Care Team:    Murray Hodgkins, NP  Christell Faith, PA-C  Marrianne Mood, PA-C  Cadence Kathlen Mody, [place]  Other Instructions  Recommend resuming Ceterizine (Zyrtec) to help with allergies.   Heart Healthy Diet Recommendations: A low-salt diet is  recommended. Meats should be grilled, baked, or boiled. Avoid fried foods. Focus on lean protein sources like fish or chicken with vegetables and fruits. The American Heart Association is a Microbiologist!  American Heart Association Diet and Lifeystyle Recommendations   Exercise recommendations: The American Heart Association recommends 150 minutes of moderate intensity exercise weekly. Try 30 minutes of moderate intensity exercise 4-5 times per week. This could include walking, jogging, or swimming.

## 2020-08-31 NOTE — Progress Notes (Signed)
Office Visit    Patient Name: Leslie Houston Date of Encounter: 09/02/2020  PCP:  Delsa Grana, [place]  Cardiologist:  Ida Rogue, MD  Advanced Practice Provider:  No care team member to display Electrophysiologist:  None    Chief Complaint    Leslie Houston is a 70 y.o. female with a hx of hypertension, CVA, HFpEF, coronary artery disease, hyperlipidemia, DM2, CKD 4, obstructive uropathy, stroke, anemia of chronic disease, orthostatic hypotension presents today for follow up of heart failure.   Past Medical History    Past Medical History:  Diagnosis Date  . (HFpEF) heart failure with preserved ejection fraction ([place])    a. Pt reports in 2000 she had CHF, details unclear, outside hospital; b. 06/2019 Echo: EF 50-55%, no rwma, Gr2 DD,  mildly dil LA, mild MR.  [name] CAD (coronary artery disease)    a. Pt reports in 2000 she had a stent to a coronary artery, details unclear, outside hospital.  . Carotid stenosis    a. CT angio head 07/2014 - 50-60% stenoses of prox ICA bilaterally.  . CKD (chronic kidney disease), stage III ([place])   . Diabetes mellitus ([place])    Denies.  . Edentulous    No upper teeth  . Family hx of colon cancer 06/13/2018   Father diagnosed in his 47's  . Gout   . HOH (hard of hearing)    usually needs to read lips  . Hyperlipidemia   . Hypertension   . Orthostatic hypotension   . Osteopenia 01/24/2018   DEXA Sept 2019  . PONV (postoperative nausea and vomiting)   . PTSD (post-traumatic stress disorder)       . PUD (peptic ulcer disease)   . PVC's (premature ventricular contractions)   . Seizures ([place])   . Sinus bradycardia   . Stroke [facility])    a. L PCA CVA in 05/2014.  [name] Syncope   . Weakness    left side S/P stroke   Past Surgical History:  Procedure Laterality Date  . BLADDER SURGERY    . CARDIAC CATHETERIZATION    . CESAREAN SECTION    . DENTAL SURGERY    . Heart stent    . stint       Allergies  Allergies  Allergen Reactions  . Latex Rash  . Penicillin G Itching  . Atorvastatin Other (See Comments)    Myalgias   . Other Hives    Adhesive tape    History of Present Illness    Leslie Houston is a 70 y.o. female with a hx of  hypertension, CVA, HFpEF, coronary artery disease, hyperlipidemia, DM2, CKD 4, obstructive uropathy, stroke, anemia of chronic disease, orthostatic hypotension last seen 08/10/20.  Patient underwent coronary stenting in 2000 although details unclear.  March 2021 when she was admitted with HFpEF and worsening renal function and noted to be anemic.  Echo with LVEF 50 to 55%, no significant valvular abnormalities, grade 2 diastolic dysfunction.  Sinus pause noted on telemetry however this was felt to be vagal in etiology and self resolved. She was seen in clinic 05/07/2020 noted to be volume up 10 to 12 pounds.  Her torsemide was increased to 40 mg for 5 days and then 20 mg daily.  Her amlodipine was discontinued due to edema.   She was seen in follow-up 07/01/2020 with weight down 4 pounds after dramatic diet changes.  She had trace edema on exam.  Her torsemide was  reduced to 10 mg daily.  She was also started on hydralazine 10 mg 3 times daily for mildly elevated blood pressure.  At PCP visit 08/02/2020 she was recommended to increase torsemide back to 20 mg daily.  Lab work with creatinine 2.39.  She was seen 08/10/2020 with weight up 9 pounds over 64 weeks and had not increased torsemide to 20 mg.  She was recommended to increase torsemide to 20 mg daily and reduce hydralazine to 10 mg twice daily as she had home hypotension.  She presents today for follow-up with her friend. She is pleased with her weight loss. Reports no shortness of breath and that dyspnea on exertion is improving. She has been doing more walking for exercise.. Reports no chest pain, pressure, or tightness. No edema, orthopnea, PND. Reports no palpitations.    EKGs/Labs/Other  Studies Reviewed:   The following studies were reviewed today:  EKG:  No EKG today.  Recent Labs: 05/03/2020: Hemoglobin 11.2; Platelets 213 08/02/2020: ALT 9 08/31/2020: BUN 45; Creatinine, Ser 2.82; Potassium 3.8; Sodium 142  Recent Lipid Panel    Component Value Date/Time   CHOL 161 03/04/2020 1613   TRIG 53 03/04/2020 1613   HDL 76 03/04/2020 1613   CHOLHDL 2.1 03/04/2020 1613   VLDL 9 11/19/2016 1141   LDLCALC 72 03/04/2020 1613    Home Medications   Current Meds  Medication Sig  . ASPIRIN LOW DOSE 81 MG EC tablet TAKE 1 TABLET BY MOUTH ONCE DAILY *SWALLOW WHOLE*  . Cholecalciferol (VITAMIN D3) 25 MCG (1000 UT) CAPS Take 1 capsule by mouth daily.  . citalopram (CELEXA) 20 MG tablet Take 1 tablet once daily  . diclofenac sodium (VOLTAREN) 1 % GEL Apply 4 g topically 4 (four) times daily as needed.  . ezetimibe (ZETIA) 10 MG tablet Take 1 tablet (10 mg total) by mouth daily.  . fluticasone (FLONASE) 50 MCG/ACT nasal spray Place 2 sprays into both nostrils daily as needed for allergies (allergies).   . hydrALAZINE (APRESOLINE) 10 MG tablet Take 1 tablet (10 mg total) by mouth 2 (two) times daily.  [name] levETIRAcetam (KEPPRA) 500 MG tablet Take 1 in the morning, take 2 at night  . lovastatin (MEVACOR) 40 MG tablet Take 40 mg by mouth daily.  . tamsulosin (FLOMAX) 0.4 MG CAPS capsule Take 1 capsule (0.4 mg total) by mouth daily.  [name] torsemide (DEMADEX) 20 MG tablet Take 1 tablet (20 mg total) by mouth daily.     Review of Systems  All other systems reviewed and are otherwise negative except as noted above.  Physical Exam    VS:  BP (!) 122/58 (BP Location: Right Arm, Patient Position: Sitting, Cuff Size: Large)   Pulse (!) 57   Ht 5\' 3"  (1.6 m)   Wt 193 lb (87.5 kg)   BMI 34.19 kg/m  , BMI Body mass index is 34.19 kg/m.  Wt Readings from Last 3 Encounters:  08/31/20 193 lb (87.5 kg)  08/10/20 195 lb (88.5 kg)  08/02/20 194 lb 6.4 oz (88.2 kg)   GEN: Well nourished,  overweight, well developed, in no acute distress. HEENT: normal. Neck: Supple, no JVD, carotid bruits, or masses. Cardiac: RRR, no murmurs, rubs, or gallops. No clubbing, cyanosis. Trace pretibial edema bilaterally.  Radials/PT 2+ and equal bilaterally.  Respiratory:  Respirations regular and unlabored, clear to auscultation bilaterally. GI: Soft, nontender, nondistended. MS: No deformity or atrophy. Skin: Warm and dry, no rash. Neuro:  Strength and sensation are intact. Psych: Normal  affect.  Assessment & Plan    1. HFpEF- Weight down 2 pounds. Continue Torsemide 20mg  daily. BMP for monitoring. . Educated on daily weights and to report weight gain of 2 pounds overnight or 5 pounds in one week. Heart healthy diet and regular cardiovascular exercise encouraged.   2. CAD- Stable with no anginal symptoms. No indication for ischemic evaluation. GDMT includes aspirin, zetia, lovastatin. Heart healthy diet and regular cardiovascular exercise encouraged.  3. Hypertension- BP well controlled. Continue current antihypertensive regimen.   4. CKD IV- Careful titration of diuretics and antihypertensives.   5. Hyperlipidemia, LDL goal less than 70- Continue Zetia 10mg  daily, Lovastatin 40mg  daily.   6. Obstructive uropathy- Careful monitoring of CKD. Continue to follow with urology.  Disposition: Follow up in 2-3 month(s) with Dr. Rockey Situ or APP (of note, patient kindly requests female provider)  Signed, Loel Dubonnet, NP 09/02/2020, 4:33 PM [place]

## 2020-09-01 LAB — BASIC METABOLIC PANEL
BUN/Creatinine Ratio: 16 (ref 12–28)
BUN: 45 mg/dL — ABNORMAL HIGH (ref 8–27)
CO2: 19 mmol/L — ABNORMAL LOW (ref 20–29)
Calcium: 9.6 mg/dL (ref 8.7–10.3)
Chloride: 105 mmol/L (ref 96–106)
Creatinine, Ser: 2.82 mg/dL — ABNORMAL HIGH (ref 0.57–1.00)
Glucose: 79 mg/dL (ref 65–99)
Potassium: 3.8 mmol/L (ref 3.5–5.2)
Sodium: 142 mmol/L (ref 134–144)
eGFR: 17 mL/min/{1.73_m2} — ABNORMAL LOW (ref >59)

## 2020-09-02 ENCOUNTER — Encounter: Payer: Self-pay | Admitting: Family

## 2020-09-29 ENCOUNTER — Ambulatory Visit (INDEPENDENT_AMBULATORY_CARE_PROVIDER_SITE_OTHER): Payer: Medicare Other

## 2020-09-29 DIAGNOSIS — Z0000 Encounter for general adult medical examination without abnormal findings: Secondary | ICD-10-CM | POA: Diagnosis not present

## 2020-09-29 DIAGNOSIS — Z599 Problem related to housing and economic circumstances, unspecified: Secondary | ICD-10-CM
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
## 2020-09-29 NOTE — Patient Instructions (Signed)
Leslie Houston , Thank you for taking time to come for your Medicare Wellness Visit. I appreciate your ongoing commitment to your health goals. Please review the following plan we discussed and let me know if I can assist you in the future.   Screening recommendations/referrals: Colonoscopy: due; please discuss Cologuard at next appt Mammogram: done 02/12/18. Please call [phone] to schedule your mammogram and bone density screening Bone Density: done 01/23/18 Recommended yearly ophthalmology/optometry visit for glaucoma screening and checkup Recommended yearly dental visit for hygiene and checkup  Vaccinations: Influenza vaccine: done 03/04/20 Pneumococcal vaccine: done 01/22/19 Tdap vaccine: due Shingles vaccine: Shingrix discussed. Please contact your pharmacy for coverage information.  Covid-19: done 01/26/20  Advanced directives: Advance directive discussed with you today. Even though you declined this today please call our office should you change your mind and we can give you the proper paperwork for you to fill out.  Conditions/risks identified: Recommend increasing physical activity as tolerated  Next appointment: Follow up in one year for your annual wellness visit    Preventive Care 65 Years and Older, Female Preventive care refers to lifestyle choices and visits with your health care provider that can promote health and wellness. What does preventive care include?  A yearly physical exam. This is also called an annual well check.  Dental exams once or twice a year.  Routine eye exams. Ask your health care provider how often you should have your eyes checked.  Personal lifestyle choices, including:  Daily care of your teeth and gums.  Regular physical activity.  Eating a healthy diet.  Avoiding tobacco and drug use.  Limiting alcohol use.  Practicing safe sex.  Taking low-dose aspirin every day.  Taking vitamin and mineral supplements as recommended by your  health care provider. What happens during an annual well check? The services and screenings done by your health care provider during your annual well check will depend on your age, overall health, lifestyle risk factors, and family history of disease. Counseling  Your health care provider may ask you questions about your:  Alcohol use.  Tobacco use.  Drug use.  Emotional well-being.  Home and relationship well-being.  Sexual activity.  Eating habits.  History of falls.  Memory and ability to understand (cognition).  Work and work Statistician.  Reproductive health. Screening  You may have the following tests or measurements:  Height, weight, and BMI.  Blood pressure.  Lipid and cholesterol levels. These may be checked every 5 years, or more frequently if you are over 29 years old.  Skin check.  Lung cancer screening. You may have this screening every year starting at age 72 if you have a 30-pack-year history of smoking and currently smoke or have quit within the past 15 years.  Fecal occult blood test (FOBT) of the stool. You may have this test every year starting at age 23.  Flexible sigmoidoscopy or colonoscopy. You may have a sigmoidoscopy every 5 years or a colonoscopy every 10 years starting at age 28.  Hepatitis C blood test.  Hepatitis B blood test.  Sexually transmitted disease (STD) testing.  Diabetes screening. This is done by checking your blood sugar (glucose) after you have not eaten for a while (fasting). You may have this done every 1-3 years.  Bone density scan. This is done to screen for osteoporosis. You may have this done starting at age 75.  Mammogram. This may be done every 1-2 years. Talk to your health care provider about how often you  should have regular mammograms. Talk with your health care provider about your test results, treatment options, and if necessary, the need for more tests. Vaccines  Your health care provider may recommend  certain vaccines, such as:  Influenza vaccine. This is recommended every year.  Tetanus, diphtheria, and acellular pertussis (Tdap, Td) vaccine. You may need a Td booster every 10 years.  Zoster vaccine. You may need this after age 86.  Pneumococcal 13-valent conjugate (PCV13) vaccine. One dose is recommended after age 71.  Pneumococcal polysaccharide (PPSV23) vaccine. One dose is recommended after age 12. Talk to your health care provider about which screenings and vaccines you need and how often you need them. This information is not intended to replace advice given to you by your health care provider. Make sure you discuss any questions you have with your health care provider. Document Released: 05/13/2015 Document Revised: 01/04/2016 Document Reviewed: 02/15/2015 Elsevier Interactive Patient Education  2017 [employer] Prevention in the Home Falls can cause injuries. They can happen to people of all ages. There are many things you can do to make your home safe and to help prevent falls. What can I do on the outside of my home?  Regularly fix the edges of walkways and driveways and fix any cracks.  Remove anything that might make you trip as you walk through a door, such as a raised step or threshold.  Trim any bushes or trees on the path to your home.  Use bright outdoor lighting.  Clear any walking paths of anything that might make someone trip, such as rocks or tools.  Regularly check to see if handrails are loose or broken. Make sure that both sides of any steps have handrails.  Any raised decks and porches should have guardrails on the edges.  Have any leaves, snow, or ice cleared regularly.  Use sand or salt on walking paths during winter.  Clean up any spills in your garage right away. This includes oil or grease spills. What can I do in the bathroom?  Use night lights.  Install grab bars by the toilet and in the tub and shower. Do not use towel bars as  grab bars.  Use non-skid mats or decals in the tub or shower.  If you need to sit down in the shower, use a plastic, non-slip stool.  Keep the floor dry. Clean up any water that spills on the floor as soon as it happens.  Remove soap buildup in the tub or shower regularly.  Attach bath mats securely with double-sided non-slip rug tape.  Do not have throw rugs and other things on the floor that can make you trip. What can I do in the bedroom?  Use night lights.  Make sure that you have a light by your bed that is easy to reach.  Do not use any sheets or blankets that are too big for your bed. They should not hang down onto the floor.  Have a firm chair that has side arms. You can use this for support while you get dressed.  Do not have throw rugs and other things on the floor that can make you trip. What can I do in the kitchen?  Clean up any spills right away.  Avoid walking on wet floors.  Keep items that you use a lot in easy-to-reach places.  If you need to reach something above you, use a strong step stool that has a grab bar.  Keep electrical cords out  of the way.  Do not use floor polish or wax that makes floors slippery. If you must use wax, use non-skid floor wax.  Do not have throw rugs and other things on the floor that can make you trip. What can I do with my stairs?  Do not leave any items on the stairs.  Make sure that there are handrails on both sides of the stairs and use them. Fix handrails that are broken or loose. Make sure that handrails are as long as the stairways.  Check any carpeting to make sure that it is firmly attached to the stairs. Fix any carpet that is loose or worn.  Avoid having throw rugs at the top or bottom of the stairs. If you do have throw rugs, attach them to the floor with carpet tape.  Make sure that you have a light switch at the top of the stairs and the bottom of the stairs. If you do not have them, ask someone to add them  for you. What else can I do to help prevent falls?  Wear shoes that:  Do not have high heels.  Have rubber bottoms.  Are comfortable and fit you well.  Are closed at the toe. Do not wear sandals.  If you use a stepladder:  Make sure that it is fully opened. Do not climb a closed stepladder.  Make sure that both sides of the stepladder are locked into place.  Ask someone to hold it for you, if possible.  Clearly mark and make sure that you can see:  Any grab bars or handrails.  First and last steps.  Where the edge of each step is.  Use tools that help you move around (mobility aids) if they are needed. These include:  Canes.  Walkers.  Scooters.  Crutches.  Turn on the lights when you go into a dark area. Replace any light bulbs as soon as they burn out.  Set up your furniture so you have a clear path. Avoid moving your furniture around.  If any of your floors are uneven, fix them.  If there are any pets around you, be aware of where they are.  Review your medicines with your doctor. Some medicines can make you feel dizzy. This can increase your chance of falling. Ask your doctor what other things that you can do to help prevent falls. This information is not intended to replace advice given to you by your health care provider. Make sure you discuss any questions you have with your health care provider. Document Released: 02/10/2009 Document Revised: 09/22/2015 Document Reviewed: 05/21/2014 Elsevier Interactive Patient Education  2017 [employer].

## 2020-10-06 ENCOUNTER — Telehealth: Payer: Self-pay

## 2020-10-06 NOTE — Telephone Encounter (Signed)
   Telephone encounter was:  Successful.  10/06/2020 Name: Rema Lievanos MRN: [id number] DOB: [birth year]  Totiana Everson is a 70 y.o. year old female who is a primary care patient of Delsa Grana, [place] . The community resource team was consulted for assistance with Financial Difficulties related to food and utilities  Care guide performed the following interventions: Spoke with patient's caregiver [name], she was unable to talk and said she would call me back today..  Follow Up Plan:  Client will call back today  Shamiracle Gorden, AAS Paralegal, [facility]  [address], [place] [address] ??[email or web address]  ?? [phone]   [email or web address]

## 2020-10-11 ENCOUNTER — Telehealth: Payer: Self-pay

## 2020-10-11 NOTE — Telephone Encounter (Signed)
   Telephone encounter was:  Successful.  10/11/2020 Name: Leslie Houston MRN: [id number] DOB: May 23, 1950  Leslie Houston is a 70 y.o. year old female who is a primary care patient of Delsa Grana, [place] . The community resource team was consulted for assistance with  food insecurity, utility assistance and housing  Care guide performed the following interventions: Spoke with patient's caregiver [name] verified email address [email or web address].  Emailed Health visitor. Sent referral to [facility] on [employer].  Also emailed [facility] housing list. .  Follow Up Plan:  Care guide will follow up with patient by phone over the next 7 days  Eros Montour, AAS Paralegal, [facility]  [address],  [address] ??[email or web address]  ?? [phone]   [email or web address]

## 2020-10-14 ENCOUNTER — Telehealth: Payer: Self-pay

## 2020-10-14 NOTE — Telephone Encounter (Signed)
   Telephone encounter was:  Unsuccessful.  10/14/2020 Name: Leslie Houston MRN: [id number] DOB: 10-Oct-1950  Unsuccessful outbound call made today to assist with:  Financial Difficulties related to food, housing and utilities.  Outreach Attempt:  2nd Attempt  Unable to leave message voicemail is full. Called to follow-up with patient's caregiver [name] wanted to confirm if email has been received.   Iola Turri, AAS Paralegal, [facility] [facility] [facility]  [address], [place] [address] ??[email or web address]  ?? [phone]   [email or web address]

## 2020-10-17 ENCOUNTER — Telehealth: Payer: Self-pay

## 2020-10-17 NOTE — Telephone Encounter (Signed)
   Telephone encounter was:  Unsuccessful.  10/17/2020 Name: Leslie Houston MRN: [id number] DOB: Mar 31, 1951  Unsuccessful outbound call made today to assist with:  Food insecurity, utility assistance  Outreach Attempt:  3rd Attempt.  Referral closed unable to contact patient.   Unable to leave message voicemail full. Called to follow-up with patient's caregiver [name] wanted to confirm email resources were received. [name], AAS Paralegal, [facility]  [address],  [address] ??[email or web address]  ?? [phone]   [email or web address]

## 2020-11-09 NOTE — Progress Notes (Signed)
Cardiology Office Note:    Date:  11/13/2020   ID:  Leslie Houston, DOB 03-31-1951, MRN [id number]  PCP:  Delsa Grana, PA-C  [facility] Cardiologist:  Ida Rogue, MD  [facility] Electrophysiologist:  None   Referring MD: Delsa Grana, PA-C   Chief Complaint: 3 month f/u  History of Present Illness:    Leslie Houston is a 70 y.o. female with a hx of HTN, CVA, HFpEF, coronary artery disease, HLD, DM2, CKD stage 4, obstructive uropathy, stroke, anemia of chronic disease, orthostatic hypotension who presents for follow-up.   Patient underwent coronary stenting in 2000 although details inclear. March 2021 she was admitted with HFpEF and worsening renal function and noted to be anemic. Echo showed LVEF 50-55%, no significant valvular abnormalities, G2DD. Sinus pause noted on telemetry however this was felt to be vagal in etiology and self resolved. She was seen in clinic 05/07/20 noted to be volume up and torsemide was increased for a few days. Amlodipine was discontinued for edema.   She was seen in follow-up 07/01/20 and weight was down. Torsemide was reduced to 10mg  daily. She was started on hydralazine 10mg  TID. At PCP visit 08/02/20 torsemide was increased to 20mg  daily. Lab work showed creatinine 2.39. Seen in follow-up 08/10/20 and weight was up 9 lbs since she had not increased torsemide. Hydralazine was reduced for hypotension.   Last seen 08/31/20 and was doing well from a cardiac perspective, weight down 2lbs.   Today, the patient reports some lower leg edema. Caregiver was gone for 2 weeks, so the diet has not been as consistent. Caregiver feels the patient has gained some weight. Patient's daughter has MS and occasionally visited the patient while caregiver was gone. Weight is up 4lbs since las visit. She reports diet noncompliance, eating some extra salt. Recommend elevated feet. She has compression stockings but they are too tight. She takes torsemide 20mg  daily. She has 1+  edema on exam.   Past Medical History:  Diagnosis Date   (HFpEF) heart failure with preserved ejection fraction ([place])    a. Pt reports in 2000 she had CHF, details unclear, outside hospital; b. 06/2019 Echo: EF 50-55%, no rwma, Gr2 DD,  mildly dil LA, mild MR.   CAD (coronary artery disease)    a. Pt reports in 2000 she had a stent to a coronary artery, details unclear, outside hospital.   Carotid stenosis    a. CT angio head 07/2014 - 50-60% stenoses of prox ICA bilaterally.   CKD (chronic kidney disease), stage III ([place])    Diabetes mellitus ([place])    Denies.   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 L PCA CVA in 05/2014.   Syncope    Weakness    left side S/P stroke    Past Surgical History:  Procedure Laterality Date   BLADDER SURGERY     CARDIAC CATHETERIZATION  CESAREAN SECTION     DENTAL SURGERY     Heart stent     stint      Current Medications: Current Meds  Medication Sig   ASPIRIN LOW DOSE 81 MG EC tablet TAKE 1 TABLET BY MOUTH ONCE DAILY *SWALLOW WHOLE*   cetirizine (ZYRTEC) 10 MG tablet Take 10 mg by mouth daily.   Cholecalciferol (VITAMIN D3) 25 MCG (1000 UT) CAPS Take 1 capsule by mouth daily.   citalopram (CELEXA) 20 MG tablet Take 1 tablet once daily   diclofenac sodium (VOLTAREN) 1 % GEL Apply 4 g topically 4 (four) times daily as needed.   ezetimibe (ZETIA) 10 MG tablet Take 1 tablet (10 mg total) by mouth daily.   fluticasone (FLONASE) 50 MCG/ACT nasal spray Place 2 sprays into both nostrils daily as  needed for allergies (allergies).    hydrALAZINE (APRESOLINE) 10 MG tablet Take 1 tablet (10 mg total) by mouth 2 (two) times daily.   levETIRAcetam (KEPPRA) 500 MG tablet Take 1 in the morning, take 2 at night   lovastatin (MEVACOR) 40 MG tablet Take 40 mg by mouth daily.   tamsulosin (FLOMAX) 0.4 MG CAPS capsule Take 1 capsule (0.4 mg total) by mouth daily.   torsemide (DEMADEX) 20 MG tablet Take 1 tablet (20 mg total) by mouth daily.     Allergies:   Latex, Penicillin g, Atorvastatin, Other, and Doxycycline   Social History   Socioeconomic History   Marital status: Single    Spouse name: Not on file   Number of children: 2   Years of education: 12   Highest education level: Not on file  Occupational History   Occupation: Disabled  Tobacco Use   Smoking status: Former    Types: Cigarettes    Quit date: 03/30/2014    Years since quitting: 6.6   Smokeless tobacco: Never  Vaping Use   Vaping Use: Never used  Substance and Sexual Activity   Alcohol use: No   Drug use: No   Sexual activity: Not Currently  Other Topics Concern   Not on file  Social History Narrative   Lives at home with her son's father.   Right-handed.   1 cup coffee per day.   Social Determinants of Health   Financial Resource Strain: High Risk   Difficulty of Paying Living Expenses: Hard  Food Insecurity: Food Insecurity Present   Worried About Running Out of Food in the Last Year: Sometimes true   Ran Out of Food in the Last Year: Never true  Transportation Needs: No Transportation Needs   Lack of Transportation (Medical): No   Lack of Transportation (Non-Medical): No  Physical Activity: Inactive   Days of Exercise per Week: 0 days   Minutes of Exercise per Session: 0 min  Stress: Stress Concern Present   Feeling of Stress : Rather much  Social Connections: Moderately Isolated   Frequency of Communication with Friends and Family: More than three times a week   Frequency of Social Gatherings with  Friends and Family: Three times a week   Attends Religious Services: More than 4 times per year   Active Member of Clubs or Organizations: No   Attends Archivist Meetings: Never   Marital Status: Divorced     Family History: The patient's family history includes Cancer in her paternal grandfather; Colon cancer in her father; Diabetes Mellitus II in her daughter; Heart attack in her mother; Heart failure in her son; Kidney disease in her son;  Lung cancer in her mother; Multiple sclerosis in her daughter; Prostate cancer in her father; Stroke in her father and mother.  ROS:   Please see the history of present illness.     All other systems reviewed and are negative.  EKGs/Labs/Other Studies Reviewed:    The following studies were reviewed today:  Echo 06/2019  1. Left ventricular ejection fraction, by estimation, is 50 to 55%. The  left ventricle has low normal function. The left ventricle has no regional  wall motion abnormalities. Left ventricular diastolic parameters are  consistent with Grade II diastolic  dysfunction (pseudonormalization).   2. Right ventricular systolic function is normal. The right ventricular  size is normal. Tricuspid regurgitation signal is inadequate for assessing  PA pressure.   3. Left atrial size was mildly dilated.   4. The mitral valve is degenerative. Mild mitral valve regurgitation. No  evidence of mitral stenosis.   5. The aortic valve is tricuspid. Aortic valve regurgitation is not  visualized. No aortic stenosis is present.   6. The inferior vena cava is normal in size with <50% respiratory  variability, suggesting right atrial pressure of 8 mmHg.   EKG:  EKG is  ordered today.  The ekg ordered today demonstrates NSR, 57bpm, first degree AV block, nonspecific T wave changes.   Recent Labs: 05/03/2020: Hemoglobin 11.2; Platelets 213 08/02/2020: ALT 9 08/31/2020: BUN 45; Creatinine, Ser 2.82; Potassium 3.8; Sodium 142  Recent Lipid  Panel    Component Value Date/Time   CHOL 161 03/04/2020 1613   TRIG 53 03/04/2020 1613   HDL 76 03/04/2020 1613   CHOLHDL 2.1 03/04/2020 1613   VLDL 9 11/19/2016 1141   LDLCALC 72 03/04/2020 1613      Physical Exam:    VS:  BP 140/70 (BP Location: Left Arm, Patient Position: Sitting, Cuff Size: Normal)   Pulse (!) 58   Ht 4\' 11"  (1.499 m)   Wt 197 lb (89.4 kg)   SpO2 98%   BMI 39.79 kg/m     Wt Readings from Last 3 Encounters:  11/11/20 197 lb (89.4 kg)  08/31/20 193 lb (87.5 kg)  08/10/20 195 lb (88.5 kg)     GEN:  Well nourished, well developed in no acute distress HEENT: Normal NECK: No JVD; No carotid bruits LYMPHATICS: No lymphadenopathy CARDIAC: RRR, no murmurs, rubs, gallops RESPIRATORY:  Clear to auscultation without rales, wheezing or rhonchi  ABDOMEN: Soft, non-tender, non-distended MUSCULOSKELETAL:  1+ edema; No deformity  SKIN: Warm and dry NEUROLOGIC:  Alert and oriented x 3 PSYCHIATRIC:  Normal affect   ASSESSMENT:    1. Chronic diastolic heart failure ([place])   2. CKD (chronic kidney disease), stage IV ([place])   3. 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 Coronary artery disease involving native coronary artery of native heart without angina pectoris   5. Hyperlipidemia, mixed   6. Acute on chronic heart failure with preserved ejection fraction (HCC)    PLAN:    In order of problems listed above:  Acute on chronic HFpEF Patient has 1+ lower leg edema on exam. She reports diet noncompliance with extra salt. She takes Torsemide 20mg  daily. Her weight is up 4lbs since the last visit. Increase torsemide to 40mg  daily for 3 days and then back down to 20mg  daily. BMET in a week. Recommend low salt diet, compression socks, and leg elevation. No BB with baseline  bradycardia.   CAD No anginal symptoms. No ischemic evaluation at this time. Continue Aspirin,  Zetia, statin.  HTN BP mildly elevated, suspect this is related to mild volume overload. Continue current medications and reevaluate at follow-up.   CKD sage IV BMET in a week  as above. Baseline Scr 2.3-2.6.   HLD LDL 72 02/2020, goal <70. Continue statin and zetia  Disposition: Follow up in 3 month(s) with APP.MD    Signed, Jaimere Feutz Ninfa Meeker, PA-C  11/13/2020 6:27 PM    [facility]

## 2020-11-11 ENCOUNTER — Ambulatory Visit (INDEPENDENT_AMBULATORY_CARE_PROVIDER_SITE_OTHER): Payer: Medicare Other | Admitting: Medical

## 2020-11-11 ENCOUNTER — Other Ambulatory Visit: Payer: Self-pay

## 2020-11-11 ENCOUNTER — Encounter: Payer: Self-pay | Admitting: Medical

## 2020-11-11 VITALS — BP 140/70 | HR 58 | Ht 59.0 in | Wt 197.0 lb

## 2020-11-11 DIAGNOSIS — E782 Mixed hyperlipidemia: Secondary | ICD-10-CM | POA: Diagnosis not present

## 2020-11-11 DIAGNOSIS — E1122 Type 2 diabetes mellitus with diabetic chronic kidney disease: Secondary | ICD-10-CM | POA: Diagnosis not present

## 2020-11-11 DIAGNOSIS — I5032 Chronic diastolic (congestive) heart failure: Secondary | ICD-10-CM | POA: Diagnosis not present

## 2020-11-11 DIAGNOSIS — I5033 Acute on chronic diastolic (congestive) heart failure: Secondary | ICD-10-CM

## 2020-11-11 DIAGNOSIS — I2510 Atherosclerotic heart disease of native coronary artery without angina pectoris: Secondary | ICD-10-CM

## 2020-11-11 DIAGNOSIS — N185 Chronic kidney disease, stage 5: Secondary | ICD-10-CM | POA: Diagnosis not present

## 2020-11-11 DIAGNOSIS — N184 Chronic kidney disease, stage 4 (severe): Secondary | ICD-10-CM

## 2020-11-11 NOTE — Patient Instructions (Addendum)
Medication Instructions:  - Your physician has recommended you make the following change in your medication:   1) CHANGE torsemide 20 mg: - take 2 tablets (40 mg) once daily x 3 days, then - resume 1 tablet (20 mg) once daily   *If you need a refill on your cardiac medications before your next appointment, please call your pharmacy*   Lab Work: - Your physician recommends that you return for lab work in: 1 week- BMP  Nature conservation officer at [facility] 1st desk on the right to check in, past the screening table Lab hours: Monday- Friday (7:30 am- 5:30 pm)   If you have labs (blood work) drawn today and your tests are completely normal, you will receive your results only by: MyChart Message (if you have MyChart) OR A paper copy in the mail If you have any lab test that is abnormal or we need to change your treatment, we will call you to review the results.   Testing/Procedures: - none ordered   Follow-Up: At [facility], you and your health needs are our priority.  As part of our continuing mission to provide you with exceptional heart care, we have created designated Provider Care Teams.  These Care Teams include your primary Cardiologist (physician) and Advanced Practice Providers (APPs -  Physician Assistants and Nurse Practitioners) who all work together to provide you with the care you need, when you need it.  We recommend signing up for the patient portal called "MyChart".  Sign up information is provided on this After Visit Summary.  MyChart is used to connect with patients for Virtual Visits (Telemedicine).  Patients are able to view lab/test results, encounter notes, upcoming appointments, etc.  Non-urgent messages can be sent to your provider as well.   To learn more about what you can do with MyChart, go to [email or web address].    Your next appointment:   3 month(s)  The format for your next appointment:   In Person  Provider:   You may see Ida Rogue, MD or  one of the following Advanced Practice Providers on your designated Care Team:   Murray Hodgkins, NP Christell Faith, PA-C Marrianne Mood, PA-C Cadence Kathlen Mody, [place]   Other Instructions N/a

## 2020-11-17 ENCOUNTER — Telehealth: Payer: Self-pay

## 2020-11-17 NOTE — Telephone Encounter (Signed)
   Telephone encounter was:  Successful.  11/17/2020 Name: Leslie Houston MRN: [id number] DOB: [birth year]  Leslie Houston is a 70 y.o. year old female who is a primary care patient of Delsa Grana, [place] . The community resource team was consulted for assistance with [place] guide performed the following interventions: Spoke with patient's caregiver Leslie Houston she has received the emailed resources for food pantries.   Follow Up Plan:  No further follow up planned at this time. The patient has been provided with needed resources.  Sahaana Weitman, AAS Paralegal, [place] [facility]  [address],  [address] ??[email or web address]  ?? [phone]   [email or web address]

## 2020-11-17 NOTE — Telephone Encounter (Signed)
   Telephone encounter was:  Successful.  11/17/2020 Name: Leslie Houston MRN: [id number] DOB: January 27, 1951  Leslie Houston is a 70 y.o. year old female who is a primary care patient of Delsa Grana, [place] . The community resource team was consulted for assistance with [place] guide performed the following interventions: Unable to leave message for caretaker [name] [phone] voicemail full. Spoke with patient to inform her that her last delivery for [employer] would be tomorrow. Asked patient if she is ok for food and she responded yes.  I have emailed a list of food pantries to her caretaker [name].  Follow Up Plan:  No further follow up planned at this time. The patient has been provided with needed resources. and I will attempt to contact the patient's caregiver again.  Kyla Duffy, AAS Paralegal, [place] [facility]  [address], [place] [address] ??[email or web address]  ?? [phone]   [email or web address]

## 2020-11-18 ENCOUNTER — Other Ambulatory Visit
Admission: RE | Admit: 2020-11-18 | Discharge: 2020-11-18 | Disposition: A | Discharge status: Home / Self Care | Payer: Medicare Other | Attending: Medical | Admitting: Medical

## 2020-11-18 ENCOUNTER — Other Ambulatory Visit: Payer: Self-pay

## 2020-11-18 DIAGNOSIS — N184 Chronic kidney disease, stage 4 (severe): Secondary | ICD-10-CM | POA: Diagnosis not present

## 2020-11-18 DIAGNOSIS — I2510 Atherosclerotic heart disease of native coronary artery without angina pectoris: Secondary | ICD-10-CM | POA: Diagnosis not present

## 2020-11-18 DIAGNOSIS — I5032 Chronic diastolic (congestive) heart failure: Secondary | ICD-10-CM

## 2020-11-18 LAB — BASIC METABOLIC PANEL
Anion gap: 8 (ref 5–15)
BUN: 49 mg/dL — ABNORMAL HIGH (ref 8–23)
CO2: 26 mmol/L (ref 22–32)
Calcium: 9.7 mg/dL (ref 8.9–10.3)
Chloride: 105 mmol/L (ref 98–111)
Creatinine, Ser: 2.65 mg/dL — ABNORMAL HIGH (ref 0.44–1.00)
GFR, Estimated: 19 mL/min — ABNORMAL LOW (ref >60)
Glucose, Bld: 101 mg/dL — ABNORMAL HIGH (ref 70–99)
Potassium: 3.9 mmol/L (ref 3.5–5.1)
Sodium: 139 mmol/L (ref 135–145)

## 2020-11-21 ENCOUNTER — Telehealth: Payer: Self-pay | Admitting: *Deleted

## 2020-11-21 NOTE — Telephone Encounter (Signed)
Spoke with patients caregiver per release form. Reviewed results with her and she states that when she took the 40 mg for 3 days she did not have any increased diuresis. Weight is still at 198 and reviewed that last 2 visits she was in that range. Advised that I would send this over to provider for her review and would be in touch with any recommendations. She verbalized understanding with no further questions at this time.

## 2020-11-21 NOTE — Telephone Encounter (Signed)
-----   Message from Coco, PA-C sent at 11/20/2020 11:12 AM EDT ----- Labs showed stable kidney function. Continue daily torsemide 20mg  .

## 2020-11-22 NOTE — Telephone Encounter (Signed)
Last time she saw her was on Friday and at that time her swelling was mild. She just wanted [place] to know the increased doses did not have any effects on her weight. Reviewed further recommendations as well. She verbalized understanding and advised we would call back with any further recommendations.   Furth, Cadence H, PA-C  1 hour ago (8:46 AM)    Can we call and ask how her swelling is? Remind her low salt diet, leg elevation and compression socks  Thanks

## 2020-11-24 NOTE — Telephone Encounter (Signed)
Spoke with caregiver per release form. Reviewed recommendations with her in detail. Instructed her to have patient take 2 tablets of Torsemide 20 mg (40 mg) once daily for 3 days then go back down to Torsemide 20 mg once daily. Requested that she please call us to report if this has been effective with her weight. 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 She states that patient is on Flomax to help with that but did want to mention that as well. Patient also has upcoming appointment with her primary care provider and recommended that she review all of this as well with her. She verbalized understanding of our conversation, agreement with plan, and had no further questions at this time.

## 2020-11-24 NOTE — Telephone Encounter (Signed)
Someone answered phone and stated caregiver was still asleep. Requested to have her call back when possible and ask for [facility].

## 2020-11-24 NOTE — Telephone Encounter (Signed)
No answer/Voicemail box is full       Furth, Cadence H, PA-C  1 hour ago (8:18 AM)    If the swelling has not changed then it is ok to double torsemide for another 3 days. It is unsure if the weight is from extra calories or fluid.  Ok to double torsemide to 40mg  x3dayse and then back down to 20mg  daily. I think most of the swelling should resolve with lifestyle changes.  Please tell patient/caregiver to call back to report how the increase did.

## 2020-11-24 NOTE — Telephone Encounter (Signed)
Patient returning call.

## 2020-12-02 ENCOUNTER — Other Ambulatory Visit: Payer: Self-pay

## 2020-12-02 ENCOUNTER — Encounter: Payer: Self-pay | Admitting: Family Medicine

## 2020-12-02 ENCOUNTER — Ambulatory Visit (INDEPENDENT_AMBULATORY_CARE_PROVIDER_SITE_OTHER): Payer: Medicare Other | Admitting: Family Medicine

## 2020-12-02 VITALS — BP 122/62 | HR 63 | Temp 97.4°F | Resp 16 | Ht 59.0 in | Wt 198.2 lb

## 2020-12-02 DIAGNOSIS — I509 Heart failure, unspecified: Secondary | ICD-10-CM | POA: Diagnosis not present

## 2020-12-02 DIAGNOSIS — I69351 Hemiplegia and hemiparesis following cerebral infarction affecting right dominant side: Secondary | ICD-10-CM

## 2020-12-02 DIAGNOSIS — G720 Drug-induced myopathy: Secondary | ICD-10-CM | POA: Diagnosis not present

## 2020-12-02 DIAGNOSIS — Z1231 Encounter for screening mammogram for malignant neoplasm of breast: Secondary | ICD-10-CM | POA: Diagnosis not present

## 2020-12-02 DIAGNOSIS — I69951 Hemiplegia and hemiparesis following unspecified cerebrovascular disease affecting right dominant side: Secondary | ICD-10-CM | POA: Diagnosis not present

## 2020-12-02 DIAGNOSIS — N184 Chronic kidney disease, stage 4 (severe): Secondary | ICD-10-CM | POA: Diagnosis not present

## 2020-12-02 DIAGNOSIS — D518 Other vitamin B12 deficiency anemias: Secondary | ICD-10-CM

## 2020-12-02 DIAGNOSIS — T466X5A Adverse effect of antihyperlipidemic and antiarteriosclerotic drugs, initial encounter: Secondary | ICD-10-CM

## 2020-12-02 DIAGNOSIS — I10 Essential (primary) hypertension: Secondary | ICD-10-CM | POA: Diagnosis not present

## 2020-12-02 DIAGNOSIS — D7589 Other specified diseases of blood and blood-forming organs: Secondary | ICD-10-CM | POA: Diagnosis not present

## 2020-12-02 DIAGNOSIS — Z5181 Encounter for therapeutic drug level monitoring: Secondary | ICD-10-CM | POA: Diagnosis not present

## 2020-12-02 DIAGNOSIS — D709 Neutropenia, unspecified: Secondary | ICD-10-CM

## 2020-12-02 DIAGNOSIS — Z1211 Encounter for screening for malignant neoplasm of colon: Secondary | ICD-10-CM

## 2020-12-02 DIAGNOSIS — F419 Anxiety disorder, unspecified: Secondary | ICD-10-CM

## 2020-12-02 DIAGNOSIS — R569 Unspecified convulsions: Secondary | ICD-10-CM

## 2020-12-02 DIAGNOSIS — D649 Anemia, unspecified: Secondary | ICD-10-CM

## 2020-12-02 DIAGNOSIS — Z780 Asymptomatic menopausal state: Secondary | ICD-10-CM | POA: Diagnosis not present

## 2020-12-02 NOTE — Progress Notes (Signed)
Name: Leslie Houston   MRN: [id number]    DOB: [birth year]   Date:12/02/2020       Progress Note  Chief Complaint  Patient presents with   Hypertension   Anxiety    Subjective:   Lorenia Hoston is a 70 y.o. female, presents to clinic for routine fup  Anxiety managed with citalopram 20 mg daily - very symptomatic, a lot of trauma and ptsd, active sx daily with fear, phobia of men, may be willing to talk to a female therapist Depression screen [facility] 2/9 12/02/2020 09/29/2020 08/02/2020  Decreased Interest 1 0 0  Down, Depressed, Hopeless 2 1 0  PHQ - 2 Score 3 1 0  Altered sleeping 3 0 0  Tired, decreased energy 3 1 0  Change in appetite 3 1 0  Feeling bad or failure about yourself  1 0 0  Trouble concentrating 0 1 0  Moving slowly or fidgety/restless 0 0 0  Suicidal thoughts 0 0 0  PHQ-9 Score 13 4 0  Difficult doing work/chores Not difficult at all Not difficult at all Not difficult at all  Some recent data might be hidden   GAD 7 : Generalized Anxiety Score 12/02/2020 07/10/2019 01/22/2019 01/12/2019  Nervous, Anxious, on Edge 3 0 3 3  Control/stop worrying 3 0 3 3  Worry too much - different things 3 0 3 3  Trouble relaxing 1 0 3 3  Restless 1 0 3 3  Easily annoyed or irritable 1 0 0 3  Afraid - awful might happen 3 0 3 3  Total GAD 7 Score 15 0 18 21  Anxiety Difficulty Not difficult at all Not difficult at all Not difficult at all Very difficult    CKD stage 4 managed by nephrology- Dr. Candiss Norse, last OV 08/29/2020 reviewed note/labs Renal function recent labs: Lab Results  Component Value Date   GFRAA 23 (L) 05/12/2020   GFRAA 20 (L) 03/04/2020   GFRAA 15 (L) 07/07/2019   Lab Results  Component Value Date   CREATININE 2.65 (H) 11/18/2020   BUN 49 (H) 11/18/2020   NA 139 11/18/2020   K 3.9 11/18/2020   CL 105 11/18/2020   CO2 26 11/18/2020    Impression/Recommendations   Patient is a 70 y.o. 2-Black or African American female with hypertension, carotid stenosis,  previous history of hydronephrosis, obesity, history of seizures and stroke in 2015 with right-sided weakness and poor right eye vision,, anxiety, IV contrast exposure 2016, bilateral knee pain, arthritis  1. Chronic kidney disease stage 4 ([place])  2. Essential hypertension  3. Edema of lower extremity   1. CKD 4 Underlying chronic kidney disease is likely secondary to hypertension and atherosclerosis. Serological work-up on April 17, 2019 is negative for ANA screen, ANCA screen, hepatitis B and C, HIV are negative, normal complements, mildly elevated kappa lambda ratio, protein to creatinine ratio of 0.27 Most recent creatinine of 2.4/GFR 20 from August 02, 2020  Renal ultrasound from July 04, 2019 showed dilated bladder with marked bilateral hydronephrosis, diffuse bilateral renal cortical thinning and increased echogenicity suggesting chronic bilateral hydronephrosis. Follow-up ultrasound in February 28, 2020 showed mild bilateral hydroureteronephrosis which is improved from previous sonogram.  Postvoid residual on July 25, 2020 was 57 mL at a urology office visit  2. Essential hypertension with lower extremity edema Blood pressure controlled at BP: 138/81  Patient has about 1+ edema bilaterally. Currently taking torsemide 20 mg daily. Dose is being adjusted by cardiology team. Continue current regimen  3. Secondary hyperparathyroidism Lab Results  Component Value Date  PTH 153 (H) 07/13/2019  CALCIUM 9.5 07/13/2019  PHOS 2.6 07/13/2019   Continue supplementation of vitamin D3 over-the-counter  4. Anemia in chronic kidney disease Hemoglobin of 11.2 from January 2022. Improved  Return in about 4 months (around 12/30/2020).  Murlean Iba, MD    Chemistry      Component Value Date/Time   NA 139 11/18/2020 1602   NA 142 08/31/2020 1420   NA 142 08/19/2014 0822   K 3.9 11/18/2020 1602   K 4.1 08/19/2014 0822   CL 105 11/18/2020 1602   CL 109 08/19/2014 0822   CO2 26  11/18/2020 1602   CO2 26 08/19/2014 0822   BUN 49 (H) 11/18/2020 1602   BUN 45 (H) 08/31/2020 1420   BUN 16 08/19/2014 0822   CREATININE 2.65 (H) 11/18/2020 1602   CREATININE 2.39 (H) 08/02/2020 0000      Component Value Date/Time   CALCIUM 9.7 11/18/2020 1602   CALCIUM 10.2 08/19/2014 0822   ALKPHOS 95 05/03/2020 1722   ALKPHOS 53 07/24/2014 0915   AST 12 08/02/2020 0000   AST 31 07/24/2014 0915   ALT 9 08/02/2020 0000   ALT 29 07/24/2014 0915   BILITOT 0.4 08/02/2020 0000   BILITOT 0.9 07/24/2014 0915     Lab Results  Component Value Date   CALCIUM 9.7 11/18/2020   No phos or PTH since 06/2019  CHF - weight up a little and no improvement with increasing diuretic dose, some mild swelling to b/l LE, she denies dyspnea on exertion, orthopnea, palpitations, CP "Dry weight normally 193" today 198 - and around there for over a month         Current Outpatient Medications:    ASPIRIN LOW DOSE 81 MG EC tablet, TAKE 1 TABLET BY MOUTH ONCE DAILY *SWALLOW WHOLE*, Disp: 30 tablet, Rfl: 11   cetirizine (ZYRTEC) 10 MG tablet, Take 10 mg by mouth daily., Disp: , Rfl:    citalopram (CELEXA) 20 MG tablet, Take 1 tablet once daily, Disp: 90 tablet, Rfl: 1   ezetimibe (ZETIA) 10 MG tablet, Take 1 tablet (10 mg total) by mouth daily., Disp: 90 tablet, Rfl: 3   fluticasone (FLONASE) 50 MCG/ACT nasal spray, Place 2 sprays into both nostrils daily as needed for allergies (allergies). , Disp: , Rfl:    hydrALAZINE (APRESOLINE) 10 MG tablet, Take 1 tablet (10 mg total) by mouth 2 (two) times daily., Disp: 180 tablet, Rfl: 1   levETIRAcetam (KEPPRA) 500 MG tablet, Take 1 in the morning, take 2 at night, Disp: 270 tablet, Rfl: 3   lovastatin (MEVACOR) 40 MG tablet, Take 40 mg by mouth daily., Disp: , Rfl:    tamsulosin (FLOMAX) 0.4 MG CAPS capsule, Take 1 capsule (0.4 mg total) by mouth daily., Disp: 90 capsule, Rfl: 3   torsemide (DEMADEX) 20 MG tablet, Take 1 tablet (20 mg total) by mouth  daily., Disp: 90 tablet, Rfl: 1   Cholecalciferol (VITAMIN D3) 25 MCG (1000 UT) CAPS, Take 1 capsule by mouth daily. (Patient not taking: Reported on 12/02/2020), Disp: , Rfl:   Patient Active Problem List   Diagnosis Date Noted   Statin myopathy 03/09/2020   Lymphedema 03/09/2020   Partial symptomatic epilepsy with complex partial seizures, not intractable, without status epilepticus ([place]) 02/18/2020   Bad dreams 02/18/2020   Chronic post-traumatic stress disorder (PTSD) 02/18/2020   Snoring 02/18/2020   Coronary artery disease 02/18/2020   Stroke ([place])  Debility 07/07/2019   Hyperkalemia 07/03/2019   AKI (acute kidney injury) ([place]) 07/02/2019   Anemia of chronic disease 07/02/2019   Bradycardia 07/02/2019   Acute CHF (congestive heart failure) ([place]) 07/02/2019   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   History of diabetes mellitus 01/22/2019   Osteopenia 01/24/2018   Anxiety 12/03/2017   Neutropenia ([place]) 12/31/2015   Seizures (HCC)    Carotid stenosis    PVC's (premature ventricular contractions)    Hemiparesis affecting right side as late effect of cerebrovascular accident (CVA) ([place]) 08/19/2014   CKD (chronic kidney disease) stage 3, GFR 30-59 ml/min ([place]) 08/19/2014   Morbid obesity ([place]) 03/04/2006   Essential hypertension 03/04/2006   GERD 03/04/2006   History of gastric ulcer 03/04/2006    Past Surgical History:  Procedure Laterality Date   [place] stent     stint      Family History  Problem Relation Age of Onset   Stroke Mother    Heart attack Mother    Lung cancer Mother    Stroke Father    Prostate cancer Father    Colon cancer Father    Diabetes Mellitus II Daughter    Multiple sclerosis Daughter    Kidney disease Son    Heart failure Son    Cancer Paternal Grandfather     Social History   Tobacco Use   Smoking status: Former    Types: Cigarettes    Quit date:  03/30/2014    Years since quitting: 6.6   Smokeless tobacco: Never  Vaping Use   Vaping Use: Never used  Substance Use Topics   Alcohol use: No   Drug use: No     Allergies  Allergen Reactions   Latex Rash   Penicillin G Itching   Atorvastatin Other (See Comments)    Myalgias    Other Hives    Adhesive tape   Doxycycline     Blood in urine    Health Maintenance  Topic Date Due   Fecal DNA (Cologuard)  Never done   MAMMOGRAM  02/13/2019   COVID-19 Vaccine (2 - Moderna series) 12/18/2020 (Originally 02/23/2020)   INFLUENZA VACCINE  01/25/2021 (Originally 11/28/2020)   Zoster Vaccines- Shingrix (1 of 2) 03/04/2021 (Originally 04/25/1999)   TETANUS/TDAP  03/09/2021 (Originally 04/24/1968)   DEXA SCAN  Completed   Hepatitis C Screening  Completed   PNA vac Low Risk Adult  Completed   HPV VACCINES  Aged Out    Chart Review Today: I personally reviewed active problem list, medication list, allergies, family history, social history, health maintenance, notes from last encounter, lab results, imaging with the patient/caregiver today.   Review of Systems  Constitutional: Negative.   HENT: Negative.    Eyes: Negative.   Respiratory: Negative.    Cardiovascular: Negative.   Gastrointestinal: Negative.   Endocrine: Negative.   Genitourinary: Negative.   Musculoskeletal: Negative.   Skin: Negative.   Allergic/Immunologic: Negative.   Neurological: Negative.   Hematological: Negative.   Psychiatric/Behavioral:  Positive for confusion, dysphoric mood and sleep disturbance. Negative for behavioral problems, self-injury and suicidal ideas. The patient is nervous/anxious.   All other systems reviewed and are negative.   Objective:   Vitals:   12/02/20 1329  BP: 122/62  Pulse: 63  Resp: 16  Temp: (!) 97.4 F (36.3 C)  SpO2: 98%  Weight: 198 lb  3.2 oz (89.9 kg)  Height: 4\' 11"  (1.499 m)    Body mass index is 40.03 kg/m.  Physical Exam Vitals and nursing note  reviewed.  Constitutional:      General: She is not in acute distress.    Appearance: She is obese. She is not ill-appearing, toxic-appearing or diaphoretic.  HENT:     Head: Normocephalic and atraumatic.     Right Ear: External ear normal.     Left Ear: External ear normal.  Eyes:     General: No scleral icterus.       Right eye: No discharge.        Left eye: No discharge.  Cardiovascular:     Rate and Rhythm: Normal rate and regular rhythm.     Pulses: Normal pulses.     Heart sounds: Normal heart sounds. No murmur heard.   No friction rub. No gallop.  Pulmonary:     Effort: Pulmonary effort is normal. No respiratory distress.     Breath sounds: Normal breath sounds. No stridor. No wheezing, rhonchi or rales.  Abdominal:     General: Bowel sounds are normal. There is no distension.     Palpations: Abdomen is soft.  Musculoskeletal:     Right lower leg: Edema present.     Left lower leg: Edema present.  Skin:    Capillary Refill: Capillary refill takes less than 2 seconds.     Coloration: Skin is not jaundiced or pale.     Findings: Bruising present. No lesion or rash.  Neurological:     Mental Status: She is alert. Mental status is at baseline.     Gait: Gait abnormal.  Psychiatric:        Attention and Perception: Attention normal.        Mood and Affect: Mood is anxious. Affect is tearful.        Behavior: Behavior is cooperative.        Thought Content: Thought content does not include homicidal or suicidal ideation. Thought content does not include homicidal or suicidal plan.        Assessment & Plan:     ICD-10-CM   1. Anxiety  F41.9    phq and gad 7 scores worse - consult therapist or psychiatry - needs female consult    2. CKD (chronic kidney disease) stage 4, GFR 15-29 ml/min (HCC)  N18.4 CBC with Differential/Platelet    COMPLETE METABOLIC PANEL WITH GFR    VITAMIN D 25 Hydroxy (Vit-D Deficiency, Fractures)    Parathyroid hormone, intact (no Ca)     Phosphorus   per nephrology - upcoming appt    3. Essential hypertension  B55 COMPLETE METABOLIC PANEL WITH GFR   stable, well controlled, BP at goal today - also managed by cardiology due to CHF and nephrology due to CKD stage 4    4. Acute congestive heart failure, unspecified heart failure type (HCC)  H74.1 COMPLETE METABOLIC PANEL WITH GFR   weight up with some mild LE edema, no other sx concerning for acute fluid overload - they have f/up with cardiology    5. Statin myopathy  U38.4 COMPLETE METABOLIC PANEL WITH GFR   T46.6X5A Lipid panel    6. Hemiparesis affecting right side as late effect of cerebrovascular accident (CVA) ([place])  I69.351    baseline on asa, unable to take statin    7. Neutropenia, unspecified type ([place]) Chronic D70.9 CBC with Differential/Platelet    8. Morbid obesity (HCC) Chronic E66.01 CBC  with Differential/Platelet    COMPLETE METABOLIC PANEL WITH GFR   weight up some    9. Seizures (HCC) Chronic R56.9     10. Encounter for screening mammogram for malignant neoplasm of breast  Z12.31 MM 3D SCREEN BREAST BILATERAL    11. Postmenopausal estrogen deficiency  Z78.0 DG Bone Density    12. Screening for malignant neoplasm of colon  Z12.11 Fecal Globin By Immunochemistry    13. Anemia, unspecified type  D64.9 CBC with Differential/Platelet    Iron, TIBC and Ferritin Panel    14. Macrocytosis  D75.89 CBC with Differential/Platelet    Vitamin B12    15. Encounter for medication monitoring  Z51.81 CBC with Differential/Platelet    COMPLETE METABOLIC PANEL WITH GFR    Lipid panel    VITAMIN D 25 Hydroxy (Vit-D Deficiency, Fractures)    Iron, TIBC and Ferritin Panel    Vitamin B12    Parathyroid hormone, intact (no Ca)    Phosphorus    16. Hemiplegia and hemiparesis following unspecified cerebrovascular disease affecting right dominant side (HCC)   R42.706 COMPLETE METABOLIC PANEL WITH GFR    Lipid panel    17. Other vitamin B12 deficiency anemias    D51.8 CBC with Differential/Platelet    Vitamin B12     Asking colleagues for any recommendations on female therapists/psychologist/psychiatrists to help with current worsening sx and past trauma  Return in about 6 months (around 06/04/2021) for Routine follow-up.   Delsa Grana, PA-C 12/02/20 1:50 PM

## 2020-12-05 LAB — PHOSPHORUS: Phosphorus: 4.1 mg/dL (ref 2.1–4.3)

## 2020-12-05 LAB — COMPLETE METABOLIC PANEL WITH GFR
AG Ratio: 1.2 (calc) (ref 1.0–2.5)
ALT: 7 U/L (ref 6–29)
AST: 10 U/L (ref 10–35)
Albumin: 3.9 g/dL (ref 3.6–5.1)
Alkaline phosphatase (APISO): 90 U/L (ref 37–153)
BUN/Creatinine Ratio: 16 (calc) (ref 6–22)
BUN: 46 mg/dL — ABNORMAL HIGH (ref 7–25)
CO2: 23 mmol/L (ref 20–32)
Calcium: 9.3 mg/dL (ref 8.6–10.4)
Chloride: 108 mmol/L (ref 98–110)
Creat: 2.95 mg/dL — ABNORMAL HIGH (ref 0.60–1.00)
Globulin: 3.2 g/dL (calc) (ref 1.9–3.7)
Glucose, Bld: 83 mg/dL (ref 65–99)
Potassium: 4.5 mmol/L (ref 3.5–5.3)
Sodium: 142 mmol/L (ref 135–146)
Total Bilirubin: 0.4 mg/dL (ref 0.2–1.2)
Total Protein: 7.1 g/dL (ref 6.1–8.1)
eGFR: 16 mL/min/{1.73_m2} — ABNORMAL LOW (ref >=60)

## 2020-12-05 LAB — IRON,TIBC AND FERRITIN PANEL
%SAT: 11 % (calc) — ABNORMAL LOW (ref 16–45)
Ferritin: 44 ng/mL (ref 16–288)
Iron: 33 ug/dL — ABNORMAL LOW (ref 45–160)
TIBC: 302 mcg/dL (calc) (ref 250–450)

## 2020-12-05 LAB — CBC WITH DIFFERENTIAL/PLATELET
Absolute Monocytes: 396 cells/uL (ref 200–950)
Basophils Absolute: 11 cells/uL (ref 0–200)
Basophils Relative: 0.2 %
Eosinophils Absolute: 61 cells/uL (ref 15–500)
Eosinophils Relative: 1.1 %
HCT: 34.6 % — ABNORMAL LOW (ref 35.0–45.0)
Hemoglobin: 11.5 g/dL — ABNORMAL LOW (ref 11.7–15.5)
Lymphs Abs: 1282 cells/uL (ref 850–3900)
MCH: 32.2 pg (ref 27.0–33.0)
MCHC: 33.2 g/dL (ref 32.0–36.0)
MCV: 96.9 fL (ref 80.0–100.0)
MPV: 9.6 fL (ref 7.5–12.5)
Monocytes Relative: 7.2 %
Neutro Abs: 3751 cells/uL (ref 1500–7800)
Neutrophils Relative %: 68.2 %
Platelets: 222 10*3/uL (ref 140–400)
RBC: 3.57 10*6/uL — ABNORMAL LOW (ref 3.80–5.10)
RDW: 11.8 % (ref 11.0–15.0)
Total Lymphocyte: 23.3 %
WBC: 5.5 10*3/uL (ref 3.8–10.8)

## 2020-12-05 LAB — LIPID PANEL
Cholesterol: 159 mg/dL (ref <200)
HDL: 78 mg/dL (ref >=50)
LDL Cholesterol (Calc): 67 mg/dL (calc)
Non-HDL Cholesterol (Calc): 81 mg/dL (calc) (ref <130)
Total CHOL/HDL Ratio: 2 (calc) (ref <5.0)
Triglycerides: 67 mg/dL (ref <150)

## 2020-12-05 LAB — PARATHYROID HORMONE, INTACT (NO CA): PTH: 222 pg/mL — ABNORMAL HIGH (ref 16–77)

## 2020-12-05 LAB — VITAMIN B12: Vitamin B-12: 349 pg/mL (ref 200–1100)

## 2020-12-05 LAB — VITAMIN D 25 HYDROXY (VIT D DEFICIENCY, FRACTURES): Vit D, 25-Hydroxy: 30 ng/mL (ref 30–100)

## 2020-12-12 ENCOUNTER — Other Ambulatory Visit: Payer: Self-pay | Admitting: Family Medicine

## 2020-12-12 DIAGNOSIS — Z62810 Personal history of physical and sexual abuse in childhood: Secondary | ICD-10-CM

## 2020-12-12 DIAGNOSIS — F70 Mild intellectual disabilities: Secondary | ICD-10-CM

## 2020-12-12 DIAGNOSIS — F419 Anxiety disorder, unspecified: Secondary | ICD-10-CM

## 2020-12-12 DIAGNOSIS — F4312 Post-traumatic stress disorder, chronic: Secondary | ICD-10-CM

## 2020-12-12 DIAGNOSIS — F331 Major depressive disorder, recurrent, moderate: Secondary | ICD-10-CM

## 2021-01-19 ENCOUNTER — Ambulatory Visit: Payer: Medicare Other | Admitting: Neurology

## 2021-01-23 ENCOUNTER — Ambulatory Visit: Payer: Medicare Other | Attending: Physician Assistant

## 2021-01-25 ENCOUNTER — Telehealth: Payer: Self-pay

## 2021-01-25 DIAGNOSIS — E119 Type 2 diabetes mellitus without complications: Secondary | ICD-10-CM | POA: Diagnosis not present

## 2021-01-25 DIAGNOSIS — H353131 Nonexudative age-related macular degeneration, bilateral, early dry stage: Secondary | ICD-10-CM | POA: Diagnosis not present

## 2021-01-25 NOTE — Telephone Encounter (Signed)
Attempted to reach patients caregiver [name] at both numbers listed in the chart and both were forwarded to VM that would not allow me to leave a message because the mailbox is full. Patient needs to be R/S for 9/29 appt with Sam due to no renal ultrasound being done. Appears patient was scheduled for RUS and was a no show. RUS needs to be R/S then office visit.

## 2021-01-26 ENCOUNTER — Telehealth: Payer: Self-pay | Admitting: Physician Assistant

## 2021-01-26 ENCOUNTER — Ambulatory Visit: Payer: Medicare Other | Admitting: Physician Assistant

## 2021-01-26 NOTE — Telephone Encounter (Signed)
I called and s/w pt.  I told her we were canceling appt today, due to no RUS.  She didn't even know she had appt last Monday for RUS OR today with [place].  Her caregiver handles all her appts and v/m is full.  I will continue trying to call her today and see if I can get RUS r/s, then r/s appt here.

## 2021-02-06 ENCOUNTER — Other Ambulatory Visit: Payer: Self-pay | Admitting: Family Medicine

## 2021-02-07 ENCOUNTER — Ambulatory Visit: Payer: Medicare Other | Attending: Physician Assistant

## 2021-02-14 ENCOUNTER — Encounter: Payer: Self-pay | Admitting: Cardiovascular Disease

## 2021-02-14 ENCOUNTER — Other Ambulatory Visit: Payer: Self-pay

## 2021-02-14 ENCOUNTER — Ambulatory Visit (INDEPENDENT_AMBULATORY_CARE_PROVIDER_SITE_OTHER): Payer: Medicare Other | Admitting: Cardiovascular Disease

## 2021-02-14 VITALS — BP 136/70 | HR 56 | Ht 59.0 in | Wt 197.8 lb

## 2021-02-14 DIAGNOSIS — I5032 Chronic diastolic (congestive) heart failure: Secondary | ICD-10-CM | POA: Diagnosis not present

## 2021-02-14 DIAGNOSIS — I25118 Atherosclerotic heart disease of native coronary artery with other forms of angina pectoris: Secondary | ICD-10-CM

## 2021-02-14 DIAGNOSIS — N185 Chronic kidney disease, stage 5: Secondary | ICD-10-CM | POA: Diagnosis not present

## 2021-02-14 DIAGNOSIS — I2510 Atherosclerotic heart disease of native coronary artery without angina pectoris: Secondary | ICD-10-CM

## 2021-02-14 DIAGNOSIS — N184 Chronic kidney disease, stage 4 (severe): Secondary | ICD-10-CM

## 2021-02-14 DIAGNOSIS — E1122 Type 2 diabetes mellitus with diabetic chronic kidney disease: Secondary | ICD-10-CM | POA: Diagnosis not present

## 2021-02-14 DIAGNOSIS — I10 Essential (primary) hypertension: Secondary | ICD-10-CM | POA: Diagnosis not present

## 2021-02-14 DIAGNOSIS — E782 Mixed hyperlipidemia: Secondary | ICD-10-CM | POA: Diagnosis not present

## 2021-02-14 NOTE — Progress Notes (Signed)
Cardiology Office Note  Date:  02/14/2021   ID:  Leslie Houston, DOB 1950-09-27, MRN [id number]  PCP:  Delsa Grana, PA-C   Chief Complaint  Patient presents with   Follow-up    3 Months follow up. Medications verbally reviewed with patient.     HPI:  Leslie Houston is a 70 y.o. female with a hx of  Coronary artery disease, history of stenting 2000 (details unavailable) chronic kidney disease,  Former smoker ask seizure disorder,  prior stroke residual right-sided vision loss/hearing loss,  presenting to the hospital March 2021 with worsening renal function, worsening leg swelling shortness of breath symptoms over the past 2 months Who presents for office follow-up of her diastolic CHF  Last seen in clinic by myself January 2022, On that visit weight was up 10 pounds, amlodipine held, given several days of torsemide 40 then down to 20 Seen in follow-up March 2022, torsemide down to 10, started on hydralazine 10 3 times daily Primary care visit April 22, torsemide back to 20  In follow-up today she presents with a friend Difficulty walking, residual deficits on the right from her prior stroke, right leg weakness  Chronic leg swelling on the right, does not wear compression hose Reports weight is stable No regular exercise program Denies abdominal distention, no shortness of breath Remains on torsemide 20 mg daily  Lab work reviewed Cr 2.95 Followed by Dr. Candiss Norse Hemoglobin stable 11.5, low iron, she is not taking iron supplement  EKG personally reviewed by myself on todays visit Sinus bradycardia rate 56 bpm no significant ST-T wave changes  Other past medical history reviewed  hospital March 2021 massive leg swelling, shortness of breath,anemic hematocrit 26/hemoglobin 8.7 Amlodipine was held, Echo with normal ejection fraction It was felt her anemia was secondary to renal disease and iron deficiency  Blood pressure medication changes made Had obstructive  uropathy Sinus pauses initially noted on telemetry resolved Felt vagal reaction, no indication for pacing Recommendation made for outpatient sleep study It was recommended that she avoid beta-blockers, clonidine, verapamil/diltiazem  2D echo 07/03/2019:  1. Left ventricular ejection fraction, by estimation, is 50 to 55%.   PMH:   has a past medical history of (HFpEF) heart failure with preserved ejection fraction ([place]), CAD (coronary artery disease), Carotid stenosis, CKD (chronic kidney disease), stage III ([place]), Diabetes mellitus ([place]), Edentulous, Family hx of colon cancer (06/13/2018), Gout, HOH (hard of hearing), Hyperlipidemia, Hypertension, Orthostatic hypotension, Osteopenia (01/24/2018), PONV (postoperative nausea and vomiting), PTSD (post-traumatic stress disorder), PUD (peptic ulcer disease), PVC's (premature ventricular contractions), Seizures ([place]), Sinus bradycardia, Stroke ([place]), Syncope, and Weakness.  PSH:    Past Surgical History:  Procedure Laterality Date   BLADDER SURGERY     CARDIAC CATHETERIZATION     CESAREAN SECTION     DENTAL SURGERY     Heart stent     stint      Current Outpatient Medications  Medication Sig Dispense Refill   ASPIRIN LOW DOSE 81 MG EC tablet TAKE 1 TABLET BY MOUTH ONCE DAILY *SWALLOW WHOLE* 30 tablet 11   cetirizine (ZYRTEC) 10 MG tablet Take 10 mg by mouth daily.     Cholecalciferol (VITAMIN D3) 25 MCG (1000 UT) CAPS Take 1 capsule by mouth daily.     citalopram (CELEXA) 20 MG tablet Take 1 tablet once daily 90 tablet 1   ezetimibe (ZETIA) 10 MG tablet Take 1 tablet (10 mg total) by mouth daily. 90 tablet 3   fluticasone (FLONASE) 50 MCG/ACT nasal spray  Place 2 sprays into both nostrils daily as needed for allergies (allergies).      hydrALAZINE (APRESOLINE) 10 MG tablet Take 1 tablet (10 mg total) by mouth 2 (two) times daily. 180 tablet 1   levETIRAcetam (KEPPRA) 500 MG tablet Take 1 in the morning, take 2 at night 270 tablet 3   tamsulosin  (FLOMAX) 0.4 MG CAPS capsule Take 1 capsule (0.4 mg total) by mouth daily. 90 capsule 3   torsemide (DEMADEX) 20 MG tablet Take 1 tablet (20 mg total) by mouth daily. 90 tablet 1   No current facility-administered medications for this visit.    Allergies:   Latex, Penicillin g, Atorvastatin, Other, and Doxycycline   Social History:  The patient  reports that she quit smoking about 6 years ago. Her smoking use included cigarettes. She has never used smokeless tobacco. She reports that she does not drink alcohol and does not use drugs.   Family History:   family history includes Cancer in her paternal grandfather; Colon cancer in her father; Diabetes Mellitus II in her daughter; Heart attack in her mother; Heart failure in her son; Kidney disease in her son; Lung cancer in her mother; Multiple sclerosis in her daughter; Prostate cancer in her father; Stroke in her father and mother.    Review of Systems: Review of Systems  Constitutional: Negative.   HENT: Negative.    Eyes: Negative.   Respiratory: Negative.    Cardiovascular: Negative.   Gastrointestinal: Negative.   Musculoskeletal: Negative.   Neurological: Negative.   Psychiatric/Behavioral: Negative.    All other systems reviewed and are negative.  PHYSICAL EXAM: VS:  BP 136/70 (BP Location: Left Arm, Patient Position: Sitting)   Pulse (!) 56   Ht 4\' 11"  (1.499 m)   Wt 197 lb 12.8 oz (89.7 kg)   SpO2 97%   BMI 39.95 kg/m  , BMI Body mass index is 39.95 kg/m. GEN: Well nourished, well developed, in no acute distress HEENT: normal Neck: no JVD, carotid bruits, or masses Cardiac: RRR; no murmurs, rubs, or gallops, trace edema left ankle, 1-2+ edema right lower extremity below the knee Respiratory:  clear to auscultation bilaterally, normal work of breathing GI: soft, nontender, nondistended, + BS MS: no deformity or atrophy Skin: warm and dry, no rash Neuro:  Strength and sensation are intact Psych: euthymic mood, full  affect  Recent Labs: 12/02/2020: ALT 7; BUN 46; Creat 2.95; Hemoglobin 11.5; Platelets 222; Potassium 4.5; Sodium 142    Lipid Panel Lab Results  Component Value Date   CHOL 159 12/02/2020   HDL 78 12/02/2020   LDLCALC 67 12/02/2020   TRIG 67 12/02/2020      Wt Readings from Last 3 Encounters:  02/14/21 197 lb 12.8 oz (89.7 kg)  12/02/20 198 lb 3.2 oz (89.9 kg)  11/11/20 197 lb (89.4 kg)     ASSESSMENT AND PLAN:  Problem List Items Addressed This Visit       Cardiology Problems   Coronary artery disease   Essential hypertension   Other Visit Diagnoses     Chronic diastolic heart failure (HCC)    -  Primary   CKD (chronic kidney disease), stage IV (HCC)       Type 2 diabetes mellitus with stage 5 chronic kidney disease not on chronic dialysis, without long-term current use of insulin (HCC)       Hyperlipidemia, mixed       Mixed hyperlipidemia  Preop cataracts surgery Acceptable risk, no changes needed to her medications, no further testing  Acute on chronic diastolic CHF Weight continues to run high but stable Caretaker helps with diet, medication compliance, things go awry when caretaker is not onsite Chronic leg swelling on the right, minimal ankle swelling on the left Denies abdominal distention, shortness of breath, fluid status unclear Underlying renal dysfunction making it challenging to pursue aggressive diuresis Creatinine close to 3 For now we will continue torsemide 20 daily with extra 20 if the left leg gets more swollen  Chronic kidney disease Followed by nephrology, creatinine at the high end of her range  Anemia Hemoglobin stable  History of stroke On aspirin, statin Residual right-sided deficits Maintaining normal sinus rhythm  History of obstructive uropathy Followed by urology  Coronary artery disease with prior stenting Details unavailable Cholesterol at goal, We have encouraged continued exercise, careful diet management  in an effort to lose weight.  Exercise limited by chronic right side weakness from residual stroke deficits   Total encounter time more than 25 minutes  Greater than 50% was spent in counseling and coordination of care with the patient    Signed, Esmond Plants, M.D., Ph.D. [place], [place]

## 2021-02-14 NOTE — Patient Instructions (Signed)
Medication Instructions:  No changes  If you need a refill on your cardiac medications before your next appointment, please call your pharmacy.    Lab work: No new labs needed   If you have labs (blood work) drawn today and your tests are completely normal, you will receive your results only by: MyChart Message (if you have MyChart) OR A paper copy in the mail If you have any lab test that is abnormal or we need to change your treatment, we will call you to review the results.   Testing/Procedures: No new testing needed   Follow-Up: At [facility], you and your health needs are our priority.  As part of our continuing mission to provide you with exceptional heart care, we have created designated Provider Care Teams.  These Care Teams include your primary Cardiologist (physician) and Advanced Practice Providers (APPs -  Physician Assistants and Nurse Practitioners) who all work together to provide you with the care you need, when you need it.  You will need a follow up appointment in 12 months  Providers on your designated Care Team:   Christopher Berge, NP Ryan Dunn, PA-C Jacquelyn Visser, PA-C Cadence Furth, PA-C  Any Other Special Instructions Will Be Listed Below (If Applicable).  COVID-19 Vaccine Information can be found at: [email or web address] For questions related to vaccine distribution or appointments, please email [email or web address] or call [phone].    

## 2021-02-20 DIAGNOSIS — H2512 Age-related nuclear cataract, left eye: Secondary | ICD-10-CM | POA: Diagnosis not present

## 2021-02-27 ENCOUNTER — Encounter: Payer: Self-pay | Admitting: Anesthesiology

## 2021-02-27 ENCOUNTER — Encounter: Payer: Self-pay | Admitting: Ophthalmology

## 2021-03-08 ENCOUNTER — Other Ambulatory Visit: Payer: Self-pay

## 2021-03-08 ENCOUNTER — Encounter: Payer: Self-pay | Admitting: Ophthalmology

## 2021-03-10 DIAGNOSIS — Z20822 Contact with and (suspected) exposure to covid-19: Secondary | ICD-10-CM | POA: Diagnosis not present

## 2021-03-14 ENCOUNTER — Other Ambulatory Visit: Payer: Self-pay | Admitting: Family Medicine

## 2021-03-14 DIAGNOSIS — E782 Mixed hyperlipidemia: Secondary | ICD-10-CM

## 2021-03-14 DIAGNOSIS — I6529 Occlusion and stenosis of unspecified carotid artery: Secondary | ICD-10-CM

## 2021-03-17 NOTE — Discharge Instructions (Signed)

## 2021-03-21 ENCOUNTER — Encounter: Payer: Self-pay | Admitting: Ophthalmology

## 2021-03-21 ENCOUNTER — Ambulatory Visit: Payer: Medicare Other | Admitting: Anesthesiology

## 2021-03-21 ENCOUNTER — Other Ambulatory Visit: Payer: Self-pay

## 2021-03-21 ENCOUNTER — Ambulatory Visit
Admission: RE | Admit: 2021-03-21 | Discharge: 2021-03-21 | Disposition: A | Discharge status: Home / Self Care | Payer: Medicare Other | Source: Ambulatory Visit | Attending: Ophthalmology | Admitting: Ophthalmology

## 2021-03-21 ENCOUNTER — Encounter
Admission: RE | Disposition: A | Discharge status: Home / Self Care | Payer: Self-pay | Source: Ambulatory Visit | Attending: Ophthalmology

## 2021-03-21 DIAGNOSIS — H2512 Age-related nuclear cataract, left eye: Secondary | ICD-10-CM | POA: Insufficient documentation

## 2021-03-21 DIAGNOSIS — Z79899 Other long term (current) drug therapy: Secondary | ICD-10-CM | POA: Diagnosis not present

## 2021-03-21 DIAGNOSIS — Z955 Presence of coronary angioplasty implant and graft: Secondary | ICD-10-CM | POA: Diagnosis not present

## 2021-03-21 DIAGNOSIS — H25812 Combined forms of age-related cataract, left eye: Secondary | ICD-10-CM | POA: Diagnosis not present

## 2021-03-21 DIAGNOSIS — Z87891 Personal history of nicotine dependence: Secondary | ICD-10-CM | POA: Insufficient documentation

## 2021-03-21 DIAGNOSIS — E1136 Type 2 diabetes mellitus with diabetic cataract: Secondary | ICD-10-CM | POA: Diagnosis not present

## 2021-03-21 DIAGNOSIS — I2510 Atherosclerotic heart disease of native coronary artery without angina pectoris: Secondary | ICD-10-CM | POA: Diagnosis not present

## 2021-03-21 DIAGNOSIS — Z9842 Cataract extraction status, left eye: Secondary | ICD-10-CM

## 2021-03-21 DIAGNOSIS — I10 Essential (primary) hypertension: Secondary | ICD-10-CM | POA: Insufficient documentation

## 2021-03-21 HISTORY — DX: Cataract extraction status, left eye: Z98.42

## 2021-03-21 HISTORY — PX: CATARACT EXTRACTION W/PHACO: SHX586

## 2021-03-21 LAB — GLUCOSE, CAPILLARY: Glucose-Capillary: 82 mg/dL (ref 70–99)

## 2021-03-21 SURGERY — PHACOEMULSIFICATION, CATARACT, WITH IOL INSERTION
Anesthesia: Monitor Anesthesia Care | Site: Eye | Laterality: Left

## 2021-03-21 MED ORDER — LACTATED RINGERS IV SOLN: INTRAVENOUS | Status: DC

## 2021-03-21 MED ORDER — SIGHTPATH DOSE#1 BSS IO SOLN
INTRAOCULAR | Status: DC | PRN
  Administered 2021-03-21: 15 mL

## 2021-03-21 MED ORDER — BRIMONIDINE TARTRATE-TIMOLOL 0.2-0.5 % OP SOLN
OPHTHALMIC | Status: DC | PRN
  Administered 2021-03-21: 1 [drp] via OPHTHALMIC

## 2021-03-21 MED ORDER — MIDAZOLAM HCL 2 MG/2ML IJ SOLN
INTRAMUSCULAR | Status: DC | PRN
Start: 2021-03-21 — End: 2021-03-21
  Administered 2021-03-21: 1 mg via INTRAVENOUS

## 2021-03-21 MED ORDER — ACETAMINOPHEN 500 MG PO TABS: 1000.0000 mg | ORAL_TABLET | Freq: Once | ORAL | Status: DC | PRN

## 2021-03-21 MED ORDER — SIGHTPATH DOSE#1 BSS IO SOLN
INTRAOCULAR | Status: DC | PRN
  Administered 2021-03-21: 81 mL via OPHTHALMIC

## 2021-03-21 MED ORDER — SIGHTPATH DOSE#1 NA CHONDROIT SULF-NA HYALURON 40-17 MG/ML IO SOLN
INTRAOCULAR | Status: DC | PRN
  Administered 2021-03-21: 1 mL via INTRAOCULAR

## 2021-03-21 MED ORDER — ACETAMINOPHEN 160 MG/5ML PO SOLN: 975.0000 mg | Freq: Once | ORAL | Status: DC | PRN

## 2021-03-21 MED ORDER — CEFUROXIME OPHTHALMIC INJECTION 1 MG/0.1 ML
INJECTION | OPHTHALMIC | Status: DC | PRN
  Administered 2021-03-21: 0.1 mL via INTRACAMERAL

## 2021-03-21 MED ORDER — SIGHTPATH DOSE#1 BSS IO SOLN
INTRAOCULAR | Status: DC | PRN
  Administered 2021-03-21: 1 mL

## 2021-03-21 MED ORDER — TETRACAINE HCL 0.5 % OP SOLN
1.0000 [drp] | OPHTHALMIC | Status: DC | PRN
  Administered 2021-03-21: 1 [drp] via OPHTHALMIC

## 2021-03-21 MED ORDER — ONDANSETRON HCL 4 MG/2ML IJ SOLN: 4.0000 mg | Freq: Once | INTRAMUSCULAR | Status: DC | PRN

## 2021-03-21 MED ORDER — ARMC OPHTHALMIC DILATING DROPS
1.0000 "application " | OPHTHALMIC | Status: DC | PRN
  Administered 2021-03-21: 1 via OPHTHALMIC

## 2021-03-21 MED ORDER — FENTANYL CITRATE (PF) 100 MCG/2ML IJ SOLN
INTRAMUSCULAR | Status: DC | PRN
  Administered 2021-03-21: 50 ug via INTRAVENOUS

## 2021-03-21 MED ORDER — TETRACAINE HCL 0.5 % OP SOLN
1.0000 [drp] | OPHTHALMIC | Status: DC | PRN
  Administered 2021-03-21 (×3): 1 [drp] via OPHTHALMIC

## 2021-03-21 MED ORDER — ARMC OPHTHALMIC DILATING DROPS
1.0000 "application " | OPHTHALMIC | Status: DC | PRN
  Administered 2021-03-21 (×3): 1 via OPHTHALMIC

## 2021-03-21 SURGICAL SUPPLY — 16 items
CANNULA ANT/CHMB 27G (MISCELLANEOUS) IMPLANT
CANNULA ANT/CHMB 27GA (MISCELLANEOUS) IMPLANT
GLOVE SURG ENC TEXT LTX SZ8 (GLOVE) ×2 IMPLANT
GLOVE SURG TRIUMPH 8.0 PF LTX (GLOVE) ×2 IMPLANT
GOWN STRL REUS W/ TWL LRG LVL3 (GOWN DISPOSABLE) ×2 IMPLANT
GOWN STRL REUS W/TWL LRG LVL3 (GOWN DISPOSABLE) ×4
LENS IOL TECNIS EYHANCE 22.5 (Intraocular Lens) ×1 IMPLANT
MARKER SKIN DUAL TIP RULER LAB (MISCELLANEOUS) IMPLANT
NDL FILTER BLUNT 18X1 1/2 (NEEDLE) ×1 IMPLANT
NEEDLE FILTER BLUNT 18X 1/2SAF (NEEDLE) ×1
NEEDLE FILTER BLUNT 18X1 1/2 (NEEDLE) ×1 IMPLANT
PACK EYE AFTER SURG (MISCELLANEOUS) IMPLANT
SYR 3ML LL SCALE MARK (SYRINGE) ×2 IMPLANT
SYR TB 1ML LUER SLIP (SYRINGE) ×2 IMPLANT
WATER STERILE IRR 250ML POUR (IV SOLUTION) ×2 IMPLANT
WIPE NON LINTING 3.25X3.25 (MISCELLANEOUS) IMPLANT

## 2021-03-21 NOTE — Transfer of Care (Signed)
Immediate Anesthesia Transfer of Care Note  Patient: Leslie Houston  Procedure(s) Performed: CATARACT EXTRACTION PHACO AND INTRAOCULAR LENS PLACEMENT (IOC) left 5.86 00:38.0 (Left: Eye)  Patient Location: PACU  Anesthesia Type: MAC  Level of Consciousness: awake, alert  and patient cooperative  Airway and Oxygen Therapy: Patient Spontanous Breathing and Patient connected to supplemental oxygen  Post-op Assessment: Post-op Vital signs reviewed, Patient's Cardiovascular Status Stable, Respiratory Function Stable, Patent Airway and No signs of Nausea or vomiting  Post-op Vital Signs: Reviewed and stable  Complications: No notable events documented.

## 2021-03-21 NOTE — Anesthesia Procedure Notes (Signed)
Procedure Name: MAC Date/Time: 03/21/2021 9:58 AM Performed by: Dionne Bucy, CRNA Pre-anesthesia Checklist: Patient identified, Emergency Drugs available, Suction available, Patient being monitored and Timeout performed Patient Re-evaluated:Patient Re-evaluated prior to induction Oxygen Delivery Method: Nasal cannula Placement Confirmation: positive ETCO2

## 2021-03-21 NOTE — Anesthesia Preprocedure Evaluation (Signed)
Anesthesia Evaluation  Patient identified by MRN, date of birth, ID band Patient awake    Reviewed: Allergy & Precautions, H&P , NPO status , Patient's Chart, lab work & pertinent test results, reviewed documented beta blocker date and time   History of Anesthesia Complications (+) PONV and history of anesthetic complications (PONV)  Airway Mallampati: I  TM Distance: >3 FB Neck ROM: full    Dental no notable dental hx.    Pulmonary neg pulmonary ROS, former smoker,    Pulmonary exam normal breath sounds clear to auscultation       Cardiovascular Exercise Tolerance: Good hypertension, + CAD and + Cardiac Stents   Rhythm:regular Rate:Normal     Neuro/Psych Seizures -,  CVA negative neurological ROS  negative psych ROS   GI/Hepatic negative GI ROS, Neg liver ROS, PUD, GERD  ,  Endo/Other  negative endocrine ROSdiabetes  Renal/GU CRFRenal diseasenegative Renal ROS  negative genitourinary   Musculoskeletal   Abdominal   Peds  Hematology negative hematology ROS (+)   Anesthesia Other Findings   Reproductive/Obstetrics negative OB ROS                             Anesthesia Physical Anesthesia Plan  ASA: 3  Anesthesia Plan: MAC   Post-op Pain Management:    Induction:   PONV Risk Score and Plan: 3 and TIVA, Midazolam and Treatment may vary due to age or medical condition  Airway Management Planned:   Additional Equipment:   Intra-op Plan:   Post-operative Plan:   Informed Consent: I have reviewed the patients History and Physical, chart, labs and discussed the procedure including the risks, benefits and alternatives for the proposed anesthesia with the patient or authorized representative who has indicated his/her understanding and acceptance.     Dental Advisory Given  Plan Discussed with: CRNA  Anesthesia Plan Comments:         Anesthesia Quick Evaluation

## 2021-03-21 NOTE — Anesthesia Postprocedure Evaluation (Signed)
Anesthesia Post Note  Patient: Leslie Houston  Procedure(s) Performed: CATARACT EXTRACTION PHACO AND INTRAOCULAR LENS PLACEMENT (IOC) left 5.86 00:38.0 (Left: Eye)     Patient location during evaluation: PACU Anesthesia Type: MAC Level of consciousness: awake and alert Pain management: pain level controlled Vital Signs Assessment: post-procedure vital signs reviewed and stable Respiratory status: spontaneous breathing, nonlabored ventilation and respiratory function stable Cardiovascular status: stable and blood pressure returned to baseline Postop Assessment: no apparent nausea or vomiting Anesthetic complications: no   No notable events documented.  April Manson

## 2021-03-21 NOTE — H&P (Signed)
[facility]   Primary Care Physician:  Delsa Grana, PA-C Ophthalmologist: Dr. Benay Pillow  Pre-Procedure History & Physical: HPI:  Leslie Houston is a 70 y.o. female here for cataract surgery.   Past Medical History:  Diagnosis Date   (HFpEF) heart failure with preserved ejection fraction ([place])    a. Pt reports in 2000 she had CHF, details unclear, outside hospital; b. 06/2019 Echo: EF 50-55%, no rwma, Gr2 DD,  mildly dil LA, mild MR.   CAD (coronary artery disease)    a. Pt reports in 2000 she had a stent to a coronary artery, details unclear, outside hospital.   Carotid stenosis    a. CT angio head 07/2014 - 50-60% stenoses of prox ICA bilaterally.   CKD (chronic kidney disease), stage III ([place])    Diabetes mellitus ([place])    Denies.   Edentulous    No upper teeth   Family hx of colon cancer 06/13/2018   Father diagnosed in his 27's   Gout    HOH (hard of hearing)    usually needs to read lips   Hyperlipidemia    Hypertension    Orthostatic hypotension    Osteopenia 01/24/2018   DEXA Sept 2019   PONV (postoperative nausea and vomiting)    PTSD (post-traumatic stress disorder)        PUD (peptic ulcer disease)    PVC's (premature ventricular contractions)    Seizures (HCC)    Sinus bradycardia    Stroke ([place])    a. L PCA CVA in 05/2014.   Syncope    Weakness    left side S/P stroke    Past Surgical History:  Procedure Laterality Date   BLADDER SURGERY     CARDIAC CATHETERIZATION     CESAREAN SECTION     DENTAL SURGERY     Heart stent     stint      Prior to Admission medications   Medication Sig Start Date End Date Taking? Authorizing Provider  ASPIRIN LOW DOSE 81 MG EC tablet TAKE 1 TABLET BY MOUTH ONCE DAILY *SWALLOW WHOLE* 08/19/19  Yes Suzzanne Cloud, NP  cetirizine (ZYRTEC) 10 MG tablet Take 10 mg by mouth daily.   Yes [provider]  Cholecalciferol (VITAMIN D3) 25 MCG (1000 UT) CAPS Take 1 capsule by mouth daily.   Yes [provider]  citalopram (CELEXA) 20 MG tablet Take 1 tablet once daily Patient taking differently: 10 mg. Take 1 tablet once daily 03/04/20  Yes Tapia, Kristeen Miss, PA-C  ezetimibe (ZETIA) 10 MG tablet TAKE 1 TABLET BY MOUTH ONCE A DAY 03/20/21  Yes Delsa Grana, PA-C  ferrous sulfate 325 (65 FE) MG tablet Take 325 mg by mouth daily with breakfast.   Yes [provider]  fluticasone (FLONASE) 50 MCG/ACT nasal spray Place 2 sprays into both nostrils daily as needed for allergies (allergies).    Yes [provider]  hydrALAZINE (APRESOLINE) 10 MG tablet Take 1 tablet (10 mg total) by mouth 2 (two) times daily. 08/10/20 09/20/22 Yes Loel Dubonnet, NP  levETIRAcetam (KEPPRA) 500 MG tablet Take 1 in the morning, take 2 at night 01/20/20  Yes Suzzanne Cloud, NP  tamsulosin (FLOMAX) 0.4 MG CAPS capsule Take 1 capsule (0.4 mg total) by mouth daily. 07/25/20  Yes Vaillancourt, Aldona Bar, PA-C  torsemide (DEMADEX) 20 MG tablet Take 1 tablet (20 mg total) by mouth daily. 08/10/20  Yes Loel Dubonnet, NP    Allergies as of 01/27/2021 -  Review Complete 12/02/2020  Allergen Reaction Noted   Latex Rash 07/13/2019   Penicillin g Itching 05/04/2019   Atorvastatin Other (See Comments) 11/12/2018   Other Hives 05/04/2019   Doxycycline  08/29/2020    Family History  Problem Relation Age of Onset   Stroke Mother    Heart attack Mother    Lung cancer Mother    Stroke Father    Prostate cancer Father    Colon cancer Father    Diabetes Mellitus II Daughter    Multiple sclerosis Daughter    Kidney disease Son    Heart failure Son    Cancer Paternal Grandfather     Social History   Socioeconomic History   Marital status: Single    Spouse name: Not on file   Number of children: 2   Years of education: 12   Highest education level: Not on file  Occupational History   Occupation: Disabled  Tobacco Use   Smoking status: Former    Types: Cigarettes    Quit date: 03/30/2014    Years  since quitting: 6.9   Smokeless tobacco: Never  Vaping Use   Vaping Use: Never used  Substance and Sexual Activity   Alcohol use: No   Drug use: No   Sexual activity: Not Currently  Other Topics Concern   Not on file  Social History Narrative   Lives at home with her son's father.   Right-handed.   1 cup coffee per day.   Social Determinants of Health   Financial Resource Strain: High Risk   Difficulty of Paying Living Expenses: Hard  Food Insecurity: Food Insecurity Present   Worried About Running Out of Food in the Last Year: Sometimes true   Ran Out of Food in the Last Year: Never true  Transportation Needs: No Transportation Needs   Lack of Transportation (Medical): No   Lack of Transportation (Non-Medical): No  Physical Activity: Inactive   Days of Exercise per Week: 0 days   Minutes of Exercise per Session: 0 min  Stress: Stress Concern Present   Feeling of Stress : Rather much  Social Connections: Moderately Isolated   Frequency of Communication with Friends and Family: More than three times a week   Frequency of Social Gatherings with Friends and Family: Three times a week   Attends Religious Services: More than 4 times per year   Active Member of Clubs or Organizations: No   Attends Archivist Meetings: Never   Marital Status: Divorced  Human resources officer Violence: Not At Risk   Fear of Current or Ex-Partner: No   Emotionally Abused: No   Physically Abused: No   Sexually Abused: No    Review of Systems: See HPI, otherwise negative ROS  Physical Exam: BP (!) 138/52   Pulse (!) 58   Temp (!) 97 F (36.1 C) (Temporal)   Resp 18   Ht 4' 11.02" (1.499 m)   Wt 89.8 kg   SpO2 97%   BMI 39.97 kg/m  General:   Alert, cooperative in NAD Head:  Normocephalic and atraumatic. Respiratory:  Normal work of breathing. Cardiovascular:  RRR  Impression/Plan: Leslie Houston is here for cataract surgery.  Risks, benefits, limitations, and alternatives  regarding cataract surgery have been reviewed with the patient.  Questions have been answered.  All parties agreeable.   Birder Robson, MD  03/21/2021, 9:48 AM

## 2021-03-21 NOTE — Op Note (Signed)
PREOPERATIVE DIAGNOSIS:  Nuclear sclerotic cataract of the left eye.   POSTOPERATIVE DIAGNOSIS:  Nuclear sclerotic cataract of the left eye.   OPERATIVE PROCEDURE:ORPROCALL@   SURGEON:  Birder Robson, MD.   ANESTHESIA:  Anesthesiologist: April Manson, MD CRNA: Dionne Bucy, CRNA  1.      Managed anesthesia care. 2.     0.67ml of Shugarcaine was instilled following the paracentesis   COMPLICATIONS:  None.   TECHNIQUE:   Stop and chop   DESCRIPTION OF PROCEDURE:  The patient was examined and consented in the preoperative holding area where the aforementioned topical anesthesia was applied to the left eye and then brought back to the Operating Room where the left eye was prepped and draped in the usual sterile ophthalmic fashion and a lid speculum was placed. A paracentesis was created with the side port blade and the anterior chamber was filled with viscoelastic. A near clear corneal incision was performed with the steel keratome. A continuous curvilinear capsulorrhexis was performed with a cystotome followed by the capsulorrhexis forceps. Hydrodissection and hydrodelineation were carried out with BSS on a blunt cannula. The lens was removed in a stop and chop  technique and the remaining cortical material was removed with the irrigation-aspiration handpiece. The capsular bag was inflated with viscoelastic and the Technis [id number] lens was placed in the capsular bag without complication. The remaining viscoelastic was removed from the eye with the irrigation-aspiration handpiece. The wounds were hydrated. The anterior chamber was flushed with BSS and the eye was inflated to physiologic pressure. 0.53ml Vigamox was placed in the anterior chamber. The wounds were found to be water tight. The eye was dressed with Combigan. The patient was given protective glasses to wear throughout the day and a shield with which to sleep tonight. The patient was also given drops with which to begin a drop  regimen today and will follow-up with me in one day. Implant Name Type Inv. Item Serial No. Manufacturer Lot No. LRB No. Used Action  LENS IOL TECNIS EYHANCE 22.5 - [id number] Intraocular Lens LENS IOL TECNIS EYHANCE 22.5 [id number] JOHNSON   Left 1 Implanted    Procedure(s) with comments: CATARACT EXTRACTION PHACO AND INTRAOCULAR LENS PLACEMENT (IOC) left 5.86 00:38.0 (Left) - Latex  Electronically signed: Birder Robson 03/21/2021 10:19 AM

## 2021-03-22 ENCOUNTER — Encounter: Payer: Self-pay | Admitting: Ophthalmology

## 2021-04-04 ENCOUNTER — Encounter: Payer: Self-pay | Admitting: Ophthalmology

## 2021-04-04 ENCOUNTER — Encounter: Payer: Self-pay | Admitting: Anesthesiology

## 2021-04-05 NOTE — Discharge Instructions (Signed)

## 2021-04-25 ENCOUNTER — Other Ambulatory Visit: Payer: Self-pay | Admitting: Family

## 2021-04-25 ENCOUNTER — Other Ambulatory Visit: Payer: Self-pay | Admitting: Neurology

## 2021-04-25 DIAGNOSIS — I10 Essential (primary) hypertension: Secondary | ICD-10-CM

## 2021-04-26 ENCOUNTER — Other Ambulatory Visit: Payer: Self-pay | Admitting: Family Medicine

## 2021-04-26 DIAGNOSIS — E782 Mixed hyperlipidemia: Secondary | ICD-10-CM

## 2021-04-26 DIAGNOSIS — I6529 Occlusion and stenosis of unspecified carotid artery: Secondary | ICD-10-CM

## 2021-05-04 ENCOUNTER — Encounter: Payer: Self-pay | Admitting: Ophthalmology

## 2021-05-16 ENCOUNTER — Ambulatory Visit: Admission: RE | Admit: 2021-05-16 | Payer: Medicare Other | Source: Ambulatory Visit | Admitting: Ophthalmology

## 2021-05-16 ENCOUNTER — Ambulatory Visit: Payer: Medicare Other | Admitting: Neurology

## 2021-05-16 SURGERY — PHACOEMULSIFICATION, CATARACT, WITH IOL INSERTION
Anesthesia: Topical | Laterality: Right

## 2021-05-24 DIAGNOSIS — Z20822 Contact with and (suspected) exposure to covid-19: Secondary | ICD-10-CM | POA: Diagnosis not present

## 2021-06-08 ENCOUNTER — Ambulatory Visit: Payer: Medicare Other | Admitting: Internal Medicine

## 2021-06-16 DIAGNOSIS — Z20822 Contact with and (suspected) exposure to covid-19: Secondary | ICD-10-CM | POA: Diagnosis not present

## 2021-06-22 DIAGNOSIS — Z20822 Contact with and (suspected) exposure to covid-19: Secondary | ICD-10-CM | POA: Diagnosis not present

## 2021-06-28 ENCOUNTER — Other Ambulatory Visit: Payer: Self-pay

## 2021-06-28 ENCOUNTER — Ambulatory Visit (INDEPENDENT_AMBULATORY_CARE_PROVIDER_SITE_OTHER): Payer: Medicare Other | Admitting: Internal Medicine

## 2021-06-28 ENCOUNTER — Encounter: Payer: Self-pay | Admitting: Internal Medicine

## 2021-06-28 ENCOUNTER — Telehealth: Payer: Self-pay | Admitting: *Deleted

## 2021-06-28 VITALS — BP 122/68 | HR 58 | Resp 18 | Ht 59.0 in | Wt 195.8 lb

## 2021-06-28 DIAGNOSIS — I10 Essential (primary) hypertension: Secondary | ICD-10-CM

## 2021-06-28 DIAGNOSIS — G40209 Localization-related (focal) (partial) symptomatic epilepsy and epileptic syndromes with complex partial seizures, not intractable, without status epilepticus: Secondary | ICD-10-CM | POA: Diagnosis not present

## 2021-06-28 DIAGNOSIS — I2510 Atherosclerotic heart disease of native coronary artery without angina pectoris: Secondary | ICD-10-CM

## 2021-06-28 DIAGNOSIS — F331 Major depressive disorder, recurrent, moderate: Secondary | ICD-10-CM | POA: Diagnosis not present

## 2021-06-28 DIAGNOSIS — N184 Chronic kidney disease, stage 4 (severe): Secondary | ICD-10-CM

## 2021-06-28 DIAGNOSIS — T466X5A Adverse effect of antihyperlipidemic and antiarteriosclerotic drugs, initial encounter: Secondary | ICD-10-CM

## 2021-06-28 MED ORDER — CITALOPRAM HYDROBROMIDE 40 MG PO TABS: 40.0000 mg | ORAL_TABLET | Freq: Every day | ORAL | 3 refills | Status: DC

## 2021-06-28 NOTE — Assessment & Plan Note (Signed)
Stable, continue Hydralazine 10 BID and Torsemide , following with Cardiology.  ?
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
## 2021-06-28 NOTE — Assessment & Plan Note (Signed)
Reviewed last lipid panel, currently on Zetia, hx of statin intolerance.  ?

## 2021-06-28 NOTE — Patient Instructions (Addendum)
It was great seeing you today! ? ?Plan discussed at today's visit: ?-Referral to social worker to set up counseling ?-Celexa increased to 40 mg daily ?-Hydralazine 10 mg twice a day, continue checking blood pressure at home as well  ?-Can take Melatonin as needed for insomnia  ?-Try Zadiator eye drops for allergies  ? ?Follow up in: 6 weeks  ? ?Take care and let us know if you have any questions or concerns prior to your next visit. ? ?Dr. Rosana Berger ? ?

## 2021-06-28 NOTE — Chronic Care Management (AMB) (Signed)
?  Chronic Care Management  ? ?Note ? ?06/28/2021 ?Name: Leslie Houston MRN: [id number] DOB: [birth year] ? ?Leslie Houston is a 71 y.o. year old female who is a primary care patient of Teodora Medici, DO. I reached out to [name] by phone today in response to a referral sent by Ms. Consuello Closs PCP. ? ?Ms. Keizer was given information about Chronic Care Management services today including:  ?CCM service includes personalized support from designated clinical staff supervised by her physician, including individualized plan of care and coordination with other care providers ?24/7 contact phone numbers for assistance for urgent and routine care needs. ?Service will only be billed when office clinical staff spend 20 minutes or more in a month to coordinate care. ?Only one practitioner may furnish and bill the service in a calendar month. ?The patient may stop CCM services at any time (effective at the end of the month) by phone call to the office staff. ?The patient is responsible for co-pay (up to 20% after annual deductible is met) if co-pay is required by the individual health plan.  ? ?Patient agreed to services and verbal consent obtained.  ? ?Follow up plan: ?Telephone appointment with care management team member scheduled for: 07/07/2021 ? ?Callyn Severtson, CCMA ?Care Guide, Embedded Care Coordination ?[employer]  Care Management  ?Direct Dial: [phone] ? ? ?

## 2021-06-28 NOTE — Assessment & Plan Note (Signed)
Following with Nephrology 
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
## 2021-06-28 NOTE — Assessment & Plan Note (Signed)
Stable, following with Neurology. ?

## 2021-07-07 ENCOUNTER — Ambulatory Visit (INDEPENDENT_AMBULATORY_CARE_PROVIDER_SITE_OTHER): Payer: Medicare Other | Admitting: *Deleted

## 2021-07-07 DIAGNOSIS — I10 Essential (primary) hypertension: Secondary | ICD-10-CM

## 2021-07-07 DIAGNOSIS — F331 Major depressive disorder, recurrent, moderate: Secondary | ICD-10-CM

## 2021-07-07 DIAGNOSIS — F419 Anxiety disorder, unspecified: Secondary | ICD-10-CM

## 2021-07-10 NOTE — Patient Instructions (Signed)
Visit Information ? ?Thank you for taking time to visit with me today. Please don't hesitate to contact me if I can be of assistance to you before our next scheduled telephone appointment. ? ?Following are the goals we discussed today:  ?- begin a notebook of services in my neighborhood or community ?- call 211 when I need some help ?- follow-up on any referrals for help I am given ?- think ahead to make sure my need does not become an emergency ?- make a list of family or friends that I can call ?  ?Our next appointment is by telephone on 07/14/21 at 11am ? ?Please call the care guide team at [phone] if you need to cancel or reschedule your appointment.  ? ?If you are experiencing a Mental Health or [place] or need someone to talk to, please call the Suicide and Crisis Lifeline: 988  ? ?Following is a copy of your full plan of care:  ?Care Plan : General Social Work (Adult)  ?Updates made by Leslie Claude, Leslie Houston since 07/10/2021 12:00 AM  ?  ? ?Problem: CHL AMB "PATIENT-SPECIFIC PROBLEM"   ?Note:   ?CARE PLAN ENTRY ?(see longitudinal plan of care for additional care plan information) ? ?Current Barriers:  ?Knowledge deficits related to accessing mental health provider in patient with Depression and Trauma History   ?Patient is experiencing symptoms of  depression which seem to be exacerbated by unresolved trauma.     ?Patient needs Support, Education, and Care Coordination in order to meet unmet mental health needs  ?Limited social support, Mental Health Concerns , Social Isolation, and Lacks knowledge of community resource: mental health follow up ? ?Clinical Social Work Goal(s):  ?Over the next 90 days, patient will work with SW bi-weekly by telephone or in person to reduce or manage symptoms of depression until connected for ongoing counseling resources.  ? ?Interventions:  ?Assessed patient's understanding, education, previous treatment and care coordination needs  ?Patient's friend Leslie Houston  interviewed and appropriate assessments performed: brief mental health assessment ?Provided basic caregiver support, education and interventions  ?Collaborated with appropriate clinical care team members regarding patient needs ?Discussed several options for long term counseling based on need and insurance, trauma focused care discussed, patient's friend agreeable-multiple mental health programs contacted to discus possible follow up ?Other interventions include: Solution-Focused Strategies employed:  ?Active listening / Reflection utilized   ?Caregiver Stress Acknowledged ? ?Patient Self Care Activities & Deficits:  ?Patient is unable to independently navigate community resource options without care coordination support ?Patient is able to implement clinical interventions discussed today and is motivated for treatment  ?Patient will select one of the agencies from the list provided and call to schedule an appointment  ?Performs ADL's independently ?Motivation for treatment ? ?Initial goal documentation ? ?  ? ? ?Leslie Houston was given information about Care Management services by the embedded care coordination team including:  ?Care Management services include personalized support from designated clinical staff supervised by her physician, including individualized plan of care and coordination with other care providers ?24/7 contact phone numbers for assistance for urgent and routine care needs. ?The patient may stop CCM services at any time (effective at the end of the month) by phone call to the office staff. ? ?Patient agreed to services and verbal consent obtained.  ? ?Patient verbalizes understanding of instructions and care plan provided today and agrees to view in [place]. Active MyChart status confirmed with patient.   ? ?Telephone follow up appointment with  care management team member scheduled for: 07/14/21 ? ?Leslie Dillard, Leslie Houston ?Clinical Social Worker  ?[facility] Care  Management ?[phone] ? ? ?  ?

## 2021-07-10 NOTE — Chronic Care Management (AMB) (Signed)
?Chronic Care Management  ? ? Clinical Social Work Note ? ?07/10/2021 ?Name: Leslie Houston MRN: [id number] DOB: [birth year] ? ?[name] is a 71 y.o. year old female who is a primary care patient of Teodora Medici, DO. The CCM team was consulted to assist the patient with chronic disease management and/or care coordination needs related to: Mental Health Counseling and Resources.  ? ?Collaboration with patient's caregiver [name]  for initial visit in response to provider referral for social work chronic care management and care coordination services.  ? ?Consent to Services:  ?The patient was given the following information about Chronic Care Management services today, agreed to services, and gave verbal consent: 1. CCM service includes personalized support from designated clinical staff supervised by the primary care provider, including individualized plan of care and coordination with other care providers 2. 24/7 contact phone numbers for assistance for urgent and routine care needs. 3. Service will only be billed when office clinical staff spend 20 minutes or more in a month to coordinate care. 4. Only one practitioner may furnish and bill the service in a calendar month. 5.The patient may stop CCM services at any time (effective at the end of the month) by phone call to the office staff. 6. The patient will be responsible for cost sharing (co-pay) of up to 20% of the service fee (after annual deductible is met). Patient agreed to services and consent obtained. ? ?Patient agreed to services and consent obtained.  ? ?Assessment: Review of patient past medical history, allergies, medications, and health status, including review of relevant consultants reports was performed today as part of a comprehensive evaluation and provision of chronic care management and care coordination services.    ? ?SDOH (Social Determinants of Health) assessments and interventions performed:   ? ?Advanced Directives Status:  Not addressed in this encounter. ? ?CCM Care Plan ? ?Allergies  ?Allergen Reactions  ? Latex Rash  ? Penicillin G Itching  ? Atorvastatin Other (See Comments)  ?  Myalgias   ? Other Hives  ?  Adhesive tape  ? Amlodipine Swelling  ?  Leg swelling  ? Doxycycline   ?  Blood in urine  ? ? ?Outpatient Encounter Medications as of 07/07/2021  ?Medication Sig  ? amLODipine (NORVASC) 5 MG tablet Take 5 mg by mouth daily.  ? ASPIRIN LOW DOSE 81 MG EC tablet TAKE 1 TABLET BY MOUTH ONCE DAILY SWALLOW WHOLE  ? Cholecalciferol (VITAMIN D3) 25 MCG (1000 UT) CAPS Take 1 capsule by mouth daily.  ? citalopram (CELEXA) 40 MG tablet Take 1 tablet (40 mg total) by mouth daily.  ? ezetimibe (ZETIA) 10 MG tablet TAKE 1 TABLET BY MOUTH ONCE A DAY  ? ferrous sulfate 325 (65 FE) MG tablet Take 325 mg by mouth daily with breakfast.  ? fluticasone (FLONASE) 50 MCG/ACT nasal spray Place 2 sprays into both nostrils daily as needed for allergies (allergies).   ? hydrALAZINE (APRESOLINE) 10 MG tablet TAKE 1 TABLET BY MOUTH TWICE A DAY  ? levETIRAcetam (KEPPRA) 500 MG tablet TAKE 1 TABLET BY MOUTH IN THE MORNING AND 2 TABLETS BY MOUTH AT NIGHT.  ? loratadine (CLARITIN) 10 MG tablet TAKE 1 TABLET BY MOUTH ONCE A DAY AS NEEDED FOR ALLERGIES  ? tamsulosin (FLOMAX) 0.4 MG CAPS capsule Take 1 capsule (0.4 mg total) by mouth daily.  ? torsemide (DEMADEX) 20 MG tablet Take 1 tablet (20 mg total) by mouth daily.  ? ?No facility-administered encounter medications on file as of 07/07/2021.  ? ? ?  Patient Active Problem List  ? Diagnosis Date Noted  ? Moderate episode of recurrent major depressive disorder ([place]) 06/28/2021  ? Statin myopathy 03/09/2020  ? Lymphedema 03/09/2020  ? Partial symptomatic epilepsy with complex partial seizures, not intractable, without status epilepticus ([place]) 02/18/2020  ? Bad dreams 02/18/2020  ? Chronic post-traumatic stress disorder (PTSD) 02/18/2020  ? Snoring 02/18/2020  ? Coronary artery disease 02/18/2020  ? Stroke [facility])   ?  Debility 07/07/2019  ? Hyperkalemia 07/03/2019  ? AKI (acute kidney injury) ([place]) 07/02/2019  ? Anemia of chronic disease 07/02/2019  ? Bradycardia 07/02/2019  ? Acute CHF (congestive heart failure) ([place]) 07/02/2019  ? 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 History of diabetes mellitus 01/22/2019  ? Osteopenia 01/24/2018  ? Anxiety 12/03/2017  ? Neutropenia ([place]) 12/31/2015  ? Seizures ([place])   ? Carotid stenosis   ? PVC's (premature ventricular contractions)   ? Hemiparesis affecting right side as late effect of cerebrovascular accident (CVA) ([place]) 08/19/2014  ? CKD (chronic kidney disease), stage IV ([place]) 08/19/2014  ? Morbid obesity ([place]) 03/04/2006  ? Essential hypertension 03/04/2006  ? GERD 03/04/2006  ? History of gastric ulcer 03/04/2006  ? ? ?Conditions to be addressed/monitored: Depression; Mental Health Concerns  ? ?Care Plan : General Social Work (Adult)  ?Updates made by Vern Claude, LCSW since 07/10/2021 12:00 AM  ?  ? ?Problem: CHL AMB "PATIENT-SPECIFIC PROBLEM"   ?Note:   ?CARE PLAN ENTRY ?(see longitudinal plan of care for additional care plan information) ? ?Current Barriers:  ?Knowledge deficits related to accessing mental health provider in patient with Depression and Trauma History   ?Patient is experiencing symptoms of  depression which seem to be exacerbated by unresolved trauma.     ?Patient needs Support, Education, and Care Coordination in order to meet unmet mental health needs  ?Limited social support, Mental Health Concerns , Social Isolation, and Lacks knowledge of community resource: mental health follow up ? ?Clinical Social Work Goal(s):  ?Over the next 90 days, patient will work with SW bi-weekly by telephone or in person to reduce or manage symptoms of depression until connected for ongoing counseling resources.  ? ?Interventions:  ?Assessed patient's understanding, education, previous treatment and care coordination needs  ?Patient's friend [name] interviewed and appropriate  assessments performed: brief mental health assessment ?Provided basic caregiver support, education and interventions  ?Collaborated with appropriate clinical care team members regarding patient needs ?Discussed several options for long term counseling based on need and insurance, trauma focused care discussed, patient's friend agreeable-multiple mental health programs contacted to discus possible follow up ?Other interventions include: Solution-Focused Strategies employed:  ?Active listening / Reflection utilized   ?Caregiver Stress Acknowledged ? ?Patient Self Care Activities & Deficits:  ?Patient is unable to independently navigate community resource options without care coordination support ?Patient is able to implement clinical interventions discussed today and is motivated for treatment  ?Patient will select one of the agencies from the list provided and call to schedule an appointment  ?Performs ADL's independently ?Motivation for treatment ? ?Initial goal documentation ? ?  ?  ? ?Follow Up Plan: SW will follow up with patient by phone over the next 14 business days ?     ?Harvey Matlack, LCSW ?Clinical Social Worker  ?[facility] Care Management ?[phone] ? ? ? ?

## 2021-07-14 ENCOUNTER — Ambulatory Visit: Payer: Medicare Other | Admitting: *Deleted

## 2021-07-14 DIAGNOSIS — F331 Major depressive disorder, recurrent, moderate: Secondary | ICD-10-CM

## 2021-07-14 DIAGNOSIS — I10 Essential (primary) hypertension: Secondary | ICD-10-CM

## 2021-07-16 NOTE — Chronic Care Management (AMB) (Signed)
?Chronic Care Management  ? ? Clinical Social Work Note ? ?07/16/2021 ?Name: Courtnay Petrilla MRN: [id number] DOB: 1950/06/26 ? ?Dianca Owensby is a 71 y.o. year old female who is a primary care patient of Teodora Medici, DO. The CCM team was consulted to assist the patient with chronic disease management and/or care coordination needs related to: Mental Health Counseling and Resources.  ? ?Collaboration with patient's friend/caretaker [name]  for follow up visit in response to provider referral for social work chronic care management and care coordination services.  ? ?Consent to Services:  ?The patient was given information about Chronic Care Management services, agreed to services, and gave verbal consent prior to initiation of services.  Please see initial visit note for detailed documentation.  ? ?Patient agreed to services and consent obtained.  ? ?Assessment: Review of patient past medical history, allergies, medications, and health status, including review of relevant consultants reports was performed today as part of a comprehensive evaluation and provision of chronic care management and care coordination services.    ? ?SDOH (Social Determinants of Health) assessments and interventions performed:   ? ?Advanced Directives Status: Not addressed in this encounter. ? ?CCM Care Plan ? ?Allergies  ?Allergen Reactions  ? Latex Rash  ? Penicillin G Itching  ? Atorvastatin Other (See Comments)  ?  Myalgias   ? Other Hives  ?  Adhesive tape  ? Amlodipine Swelling  ?  Leg swelling  ? Doxycycline   ?  Blood in urine  ? ? ?Outpatient Encounter Medications as of 07/14/2021  ?Medication Sig  ? amLODipine (NORVASC) 5 MG tablet Take 5 mg by mouth daily.  ? ASPIRIN LOW DOSE 81 MG EC tablet TAKE 1 TABLET BY MOUTH ONCE DAILY *SWALLOW WHOLE*  ? Cholecalciferol (VITAMIN D3) 25 MCG (1000 UT) CAPS Take 1 capsule by mouth daily.  ? citalopram (CELEXA) 40 MG tablet Take 1 tablet (40 mg total) by mouth daily.  ? ezetimibe (ZETIA)  10 MG tablet TAKE 1 TABLET BY MOUTH ONCE A DAY  ? ferrous sulfate 325 (65 FE) MG tablet Take 325 mg by mouth daily with breakfast.  ? fluticasone (FLONASE) 50 MCG/ACT nasal spray Place 2 sprays into both nostrils daily as needed for allergies (allergies).   ? hydrALAZINE (APRESOLINE) 10 MG tablet TAKE 1 TABLET BY MOUTH TWICE A DAY  ? levETIRAcetam (KEPPRA) 500 MG tablet TAKE 1 TABLET BY MOUTH IN THE MORNING AND 2 TABLETS BY MOUTH AT NIGHT.  ? loratadine (CLARITIN) 10 MG tablet TAKE 1 TABLET BY MOUTH ONCE A DAY AS NEEDED FOR ALLERGIES  ? tamsulosin (FLOMAX) 0.4 MG CAPS capsule Take 1 capsule (0.4 mg total) by mouth daily.  ? torsemide (DEMADEX) 20 MG tablet Take 1 tablet (20 mg total) by mouth daily.  ? ?No facility-administered encounter medications on file as of 07/14/2021.  ? ? ?Patient Active Problem List  ? Diagnosis Date Noted  ? Moderate episode of recurrent major depressive disorder ([place]) 06/28/2021  ? Statin myopathy 03/09/2020  ? Lymphedema 03/09/2020  ? Partial symptomatic epilepsy with complex partial seizures, not intractable, without status epilepticus ([place]) 02/18/2020  ? Bad dreams 02/18/2020  ? Chronic post-traumatic stress disorder (PTSD) 02/18/2020  ? Snoring 02/18/2020  ? Coronary artery disease 02/18/2020  ? Stroke [facility])   ? Debility 07/07/2019  ? Hyperkalemia 07/03/2019  ? AKI (acute kidney injury) ([place]) 07/02/2019  ? Anemia of chronic disease 07/02/2019  ? Bradycardia 07/02/2019  ? Acute CHF (congestive heart failure) ([place]) 07/02/2019  ? Mild  mental handicap 01/22/2019  ? History of diabetes mellitus 01/22/2019  ? Osteopenia 01/24/2018  ? Anxiety 12/03/2017  ? Neutropenia ([place]) 12/31/2015  ? Seizures ([place])   ? Carotid stenosis   ? PVC's (premature ventricular contractions)   ? Hemiparesis affecting right side as late effect of cerebrovascular accident (CVA) ([place]) 08/19/2014  ? CKD (chronic kidney disease), stage IV ([place]) 08/19/2014  ? Morbid obesity ([place]) 03/04/2006  ? Essential hypertension  03/04/2006  ? GERD 03/04/2006  ? History of gastric ulcer 03/04/2006  ? ? ?Conditions to be addressed/monitored: Depression; Mental Health Concerns  ? ?Care Plan : General Social Work (Adult)  ?Updates made by Vern Claude, LCSW since 07/16/2021 12:00 AM  ?  ? ?Problem: CHL AMB "PATIENT-SPECIFIC PROBLEM"   ?Note:   ?CARE PLAN ENTRY ?(see longitudinal plan of care for additional care plan information) ? ?Current Barriers:  ?Knowledge deficits related to accessing mental health provider in patient with Depression and Trauma History   ?Patient is experiencing symptoms of  depression which seem to be exacerbated by unresolved trauma.     ?Patient needs Support, Education, and Care Coordination in order to meet unmet mental health needs  ?Limited social support, Mental Health Concerns , Social Isolation, and Lacks knowledge of community resource: mental health follow up ? ?Clinical Social Work Goal(s):  ?Over the next 90 days, patient will work with SW bi-weekly by telephone or in person to reduce or manage symptoms of depression until connected for ongoing counseling resources.  ? ?Interventions:  ?Assessed patient's understanding, education, previous treatment and care coordination needs  ?Patient's friend [name] interviewed and appropriate assessments performed: brief mental health assessment ?Provided basic caregiver support, education and interventions  ?Collaborated with appropriate clinical care team members regarding patient needs ?Discussed several options for long term counseling based on need and insurance, trauma focused care discussed, patient's friend agreeable-multiple mental health programs contacted to discus possible follow up ?Other interventions include: Solution-Focused Strategies employed:  ?Active listening / Reflection utilized   ?Caregiver Stress Acknowledged ?07/14/21 Follow up phone call to patient's friend [name] to discuss status of outpatient follow up-referral sent to [place]. [name] agreeable to providing transport if needed. ? ?Patient Self Care Activities & Deficits:  ?Patient is unable to independently navigate community resource options without care coordination support ?Patient is able to implement clinical interventions discussed today and is motivated for treatment  ?Patient will select one of the agencies from the list provided and call to schedule an appointment  ?Performs ADL's independently ?Motivation for treatment ? ?Please see past updates related to this goal by clicking on the "Past Updates" button in the selected goal  ? ?  ?  ? ?Follow Up Plan: SW will follow up with patient by phone over the next 14 business days ?     ?Tanzania Basham, LCSW ?Clinical Social Worker  ?[facility] ?[phone] ? ? ? ?

## 2021-07-16 NOTE — Patient Instructions (Signed)
Visit Information ? ?Thank you for taking time to visit with me today. Please don't hesitate to contact me if I can be of assistance to you before our next scheduled telephone appointment. ? ?Following are the goals we discussed today:  ?(- begin a notebook of services in my neighborhood or community ?- call 211 when I need some help ?- follow-up on any referrals for help I am given ?- think ahead to make sure my need does not become an emergency ?- make a list of family or friends that I can call ? ?Our next appointment is by telephone on 07/28/21 at 11am ? ?Please call the care guide team at [phone] if you need to cancel or reschedule your appointment.  ? ?If you are experiencing a Mental Health or [place] or need someone to talk to, please call the Suicide and Crisis Lifeline: 988  ? ?Patient verbalizes understanding of instructions and care plan provided today and agrees to view in [place]. Active MyChart status confirmed with patient.   ? ?Telephone follow up appointment with care management team member scheduled for: 07/28/21 ? ?Raeley Gilmore, LCSW ?Clinical Social Worker  ?[facility] ?[phone] ? ?

## 2021-07-18 DIAGNOSIS — Z20822 Contact with and (suspected) exposure to covid-19: Secondary | ICD-10-CM | POA: Diagnosis not present

## 2021-07-26 ENCOUNTER — Other Ambulatory Visit: Payer: Self-pay | Admitting: Physician Assistant

## 2021-07-26 DIAGNOSIS — N319 Neuromuscular dysfunction of bladder, unspecified: Secondary | ICD-10-CM

## 2021-07-28 ENCOUNTER — Ambulatory Visit: Payer: Medicare Other | Admitting: *Deleted

## 2021-07-28 DIAGNOSIS — F419 Anxiety disorder, unspecified: Secondary | ICD-10-CM

## 2021-07-28 DIAGNOSIS — F331 Major depressive disorder, recurrent, moderate: Secondary | ICD-10-CM

## 2021-07-28 DIAGNOSIS — I10 Essential (primary) hypertension: Secondary | ICD-10-CM

## 2021-07-28 NOTE — Patient Instructions (Addendum)
Visit Information ? ?Thank you for taking time to visit with me today. Please don't hesitate to contact me if I can be of assistance to you before our next scheduled telephone appointment. ? ?Following are the goals we discussed today:  ?- begin a notebook of services in my neighborhood or community ?- call 211 when I need some help ?- follow-up on any referrals for help I am given ?- think ahead to make sure my need does not become an emergency ?- make a list of family or friends that I can call ? ?Our next appointment is by telephone on 08/18/21 at 1pm ? ?Please call the care guide team at [phone] if you need to cancel or reschedule your appointment.  ? ?If you are experiencing a Mental Health or [place] or need someone to talk to, please call the Suicide and Crisis Lifeline: 988  ? ?Patient verbalizes understanding of instructions and care plan provided today and agrees to view in [place]. Active MyChart status confirmed with patient.   ? ?Telephone follow up appointment with care management team member scheduled for: 08/18/21 ? ?Neelam Tiggs, LCSW ?Clinical Social Worker  ?[facility] ?[phone] ? ?

## 2021-07-28 NOTE — Chronic Care Management (AMB) (Signed)
?Chronic Care Management  ? ? Clinical Social Work Note ? ?07/28/2021 ?Name: Leslie Houston MRN: [id number] DOB: Dec 27, 1950 ? ?Leslie Houston is a 71 y.o. year old female who is a primary care patient of Teodora Medici, DO. The CCM team was consulted to assist the patient with chronic disease management and/or care coordination needs related to: Mental Health Counseling and Resources.  ? ?Collaboration with caregiver [name]  for follow up visit in response to provider referral for social work chronic care management and care coordination services.  ? ?Consent to Services:  ?The patient was given information about Chronic Care Management services, agreed to services, and gave verbal consent prior to initiation of services.  Please see initial visit note for detailed documentation.  ? ?Patient agreed to services and consent obtained.  ? ?Assessment: Review of patient past medical history, allergies, medications, and health status, including review of relevant consultants reports was performed today as part of a comprehensive evaluation and provision of chronic care management and care coordination services.    ? ?SDOH (Social Determinants of Health) assessments and interventions performed:   ? ?Advanced Directives Status: Not addressed in this encounter. ? ?CCM Care Plan ? ?Allergies  ?Allergen Reactions  ? Latex Rash  ? Penicillin G Itching  ? Atorvastatin Other (See Comments)  ?  Myalgias   ? Other Hives  ?  Adhesive tape  ? Amlodipine Swelling  ?  Leg swelling  ? Doxycycline   ?  Blood in urine  ? ? ?Outpatient Encounter Medications as of 07/28/2021  ?Medication Sig  ? amLODipine (NORVASC) 5 MG tablet Take 5 mg by mouth daily.  ? ASPIRIN LOW DOSE 81 MG EC tablet TAKE 1 TABLET BY MOUTH ONCE DAILY *SWALLOW WHOLE*  ? Cholecalciferol (VITAMIN D3) 25 MCG (1000 UT) CAPS Take 1 capsule by mouth daily.  ? citalopram (CELEXA) 40 MG tablet Take 1 tablet (40 mg total) by mouth daily.  ? ezetimibe (ZETIA) 10 MG tablet  TAKE 1 TABLET BY MOUTH ONCE A DAY  ? ferrous sulfate 325 (65 FE) MG tablet Take 325 mg by mouth daily with breakfast.  ? fluticasone (FLONASE) 50 MCG/ACT nasal spray Place 2 sprays into both nostrils daily as needed for allergies (allergies).   ? hydrALAZINE (APRESOLINE) 10 MG tablet TAKE 1 TABLET BY MOUTH TWICE A DAY  ? levETIRAcetam (KEPPRA) 500 MG tablet TAKE 1 TABLET BY MOUTH IN THE MORNING AND 2 TABLETS BY MOUTH AT NIGHT.  ? loratadine (CLARITIN) 10 MG tablet TAKE 1 TABLET BY MOUTH ONCE A DAY AS NEEDED FOR ALLERGIES  ? tamsulosin (FLOMAX) 0.4 MG CAPS capsule Take 1 capsule (0.4 mg total) by mouth daily.  ? torsemide (DEMADEX) 20 MG tablet Take 1 tablet (20 mg total) by mouth daily.  ? ?No facility-administered encounter medications on file as of 07/28/2021.  ? ? ?Patient Active Problem List  ? Diagnosis Date Noted  ? Moderate episode of recurrent major depressive disorder ([place]) 06/28/2021  ? Statin myopathy 03/09/2020  ? Lymphedema 03/09/2020  ? Partial symptomatic epilepsy with complex partial seizures, not intractable, without status epilepticus ([place]) 02/18/2020  ? Bad dreams 02/18/2020  ? Chronic post-traumatic stress disorder (PTSD) 02/18/2020  ? Snoring 02/18/2020  ? Coronary artery disease 02/18/2020  ? Stroke [facility])   ? Debility 07/07/2019  ? Hyperkalemia 07/03/2019  ? AKI (acute kidney injury) ([place]) 07/02/2019  ? Anemia of chronic disease 07/02/2019  ? Bradycardia 07/02/2019  ? Acute CHF (congestive heart failure) ([place]) 07/02/2019  ? Mild mental  handicap 01/22/2019  ? History of diabetes mellitus 01/22/2019  ? Osteopenia 01/24/2018  ? Anxiety 12/03/2017  ? Neutropenia ([place]) 12/31/2015  ? Seizures ([place])   ? Carotid stenosis   ? PVC's (premature ventricular contractions)   ? Hemiparesis affecting right side as late effect of cerebrovascular accident (CVA) ([place]) 08/19/2014  ? CKD (chronic kidney disease), stage IV ([place]) 08/19/2014  ? Morbid obesity ([place]) 03/04/2006  ? Essential hypertension 03/04/2006  ?  GERD 03/04/2006  ? History of gastric ulcer 03/04/2006  ? ? ?Conditions to be addressed/monitored: Depression; Mental Health Concerns  ? ?Care Plan : General Social Work (Adult)  ?Updates made by Vern Claude, LCSW since 07/28/2021 12:00 AM  ?  ? ?Problem: CHL AMB "PATIENT-SPECIFIC PROBLEM"   ?Note:   ?CARE PLAN ENTRY ?(see longitudinal plan of care for additional care plan information) ? ?Current Barriers:  ?Knowledge deficits related to accessing mental health provider in patient with Depression and Trauma History   ?Patient is experiencing symptoms of  depression which seem to be exacerbated by unresolved trauma.     ?Patient needs Support, Education, and Care Coordination in order to meet unmet mental health needs  ?Limited social support, Mental Health Concerns , Social Isolation, and Lacks knowledge of community resource: mental health follow up ? ?Clinical Social Work Goal(s):  ?Over the next 90 days, patient will work with SW bi-weekly by telephone or in person to reduce or manage symptoms of depression until connected for ongoing counseling resources.  ? ?Interventions:  ?Assessed patient's understanding, education, previous treatment and care coordination needs  ?Provided basic caregiver support, education and interventions  ?Collaborated with appropriate clinical care team members regarding patient needs ?Discussed several options for long term counseling based on need and insurance, trauma focused care discussed, patient's friend agreeable-multiple mental health programs contacted to discus possible follow up however no success at this time in finding an agency that has openings-referral to outpatient behavioral health through [facility] not able to take patient for therapy only-[facility] contacted-VM left requesting a return call ?Other interventions include: Solution-Focused Strategies employed:  ?Active listening / Reflection utilized   ?Caregiver Stress Acknowledged ? ?Patient Self Care  Activities & Deficits:  ?Patient is unable to independently navigate community resource options without care coordination support ?Patient is able to implement clinical interventions discussed today and is motivated for treatment  ?Patient will select one of the agencies from the list provided and call to schedule an appointment  ?Performs ADL's independently ?Motivation for treatment ? ?Please see past updates related to this goal by clicking on the "Past Updates" button in the selected goal  ? ?  ?  ? ?Follow Up Plan: SW will follow up with patient by phone over the next 30 business days ?     ?Jeanluc Wegman, LCSW ?Clinical Social Worker  ?[facility] ?[phone] ? ? ? ?

## 2021-07-31 ENCOUNTER — Telehealth: Payer: Self-pay

## 2021-07-31 DIAGNOSIS — N319 Neuromuscular dysfunction of bladder, unspecified: Secondary | ICD-10-CM

## 2021-07-31 MED ORDER — TAMSULOSIN HCL 0.4 MG PO CAPS: 0.4000 mg | ORAL_CAPSULE | Freq: Every day | ORAL | 0 refills | Status: DC

## 2021-07-31 NOTE — Telephone Encounter (Signed)
Incoming call on triage line from pt in regards to tamsulosin refill. Upon further review, pt was supposed to be seen Sept 2022 and we had to cancel her appt due to not having RUS. Per Sam, ok to refill tamsulosin for 90 days and set pt up for follow up, RUS prior. Advised pt she would need an appt and she requested I call her caregiver to set up appt. I will reach out to [facility] tomorrow. ?

## 2021-08-01 DIAGNOSIS — Z20822 Contact with and (suspected) exposure to covid-19: Secondary | ICD-10-CM | POA: Diagnosis not present

## 2021-08-01 NOTE — Telephone Encounter (Signed)
Attempted to reach [facility] today for appt setup, VM is full, cannot leave message. ?

## 2021-08-04 DIAGNOSIS — Z20822 Contact with and (suspected) exposure to covid-19: Secondary | ICD-10-CM | POA: Diagnosis not present

## 2021-08-08 ENCOUNTER — Ambulatory Visit: Payer: Medicare Other | Admitting: Internal Medicine

## 2021-08-08 NOTE — Progress Notes (Deleted)
? ?Established Patient Office Visit ? ?Subjective:  ?Patient ID: Leslie Houston, female    DOB: [birth year]  Age: 71 y.o. MRN: [id number] ? ?CC:  ?No chief complaint on file. ? ? ?HPI ?Leslie Houston presents for follow up on chronic medical conditions. Accompanied by her friend [name].  ? ?Hypertension/CHF: ?-Medications: Amlodipine 5 discontinued, Hydralazine 10 once daily, Torsemide 20 ?-Patient is compliant with above medications and reports no side effects. ?-Checking BP at home (average): 160's  ?-Denies any SOB, CP, vision changes, LE edema or symptoms of hypotension ?-Following with Cardiology  ? ?Hx of CVA/CAD: ?-Currently on Zetia and aspirin, statin intolerance history  ?Lipid Panel  ?   ?Component Value Date/Time  ? CHOL 159 12/02/2020 1414  ? TRIG 67 12/02/2020 1414  ? HDL 78 12/02/2020 1414  ? CHOLHDL 2.0 12/02/2020 1414  ? VLDL 9 11/19/2016 1141  ? [place] 67 12/02/2020 1414  ? ? ?CKD4: ?-Last creatinine 8/22 2.95, GFR 16 ?-Following with Nephrology  ? ?Seizure Disorder: ?-Currently on Keppra 500, doing well, no seizure like activity  ?-Following with Neurology  ? ?MDD: ?-Mood status: exacerbated ?-Current treatment: Celexa 40 mg (increased at LOV) ?-Satisfied with current treatment?: no ?-Symptom severity: severe  ?-Duration of current treatment : years ?-Side effects: no ?Medication compliance: excellent compliance ?Psychotherapy/counseling:  ?-Had a lot of trauma in past and recent deaths in family  ? ?  07/07/2021  ?  2:47 PM 06/28/2021  ? 10:54 AM 12/02/2020  ?  1:42 PM 09/29/2020  ?  2:59 PM 08/02/2020  ?  2:05 PM  ?Depression screen PHQ 2/9  ?Decreased Interest '2 2 1 '$ 0 0  ?Down, Depressed, Hopeless '2 3 2 1 '$ 0  ?PHQ - 2 Score '4 5 3 1 '$ 0  ?Altered sleeping  3 3 0 0  ?Tired, decreased energy  '2 3 1 '$ 0  ?Change in appetite  '2 3 1 '$ 0  ?Feeling bad or failure about yourself   0 1 0 0  ?Trouble concentrating  0 0 1 0  ?Moving slowly or fidgety/restless  0 0 0 0  ?Suicidal thoughts  0 0 0 0  ?PHQ-9 Score   '12 13 4 '$ 0  ?Difficult doing work/chores  Very difficult Not difficult at all Not difficult at all Not difficult at all  ? ?Health Maintenance: ?-Blood work up to date ? ? ?Past Medical History:  ?Diagnosis Date  ? (HFpEF) heart failure with preserved ejection fraction ([place])   ? a. Pt reports in 2000 she had CHF, details unclear, outside hospital; b. 06/2019 Echo: EF 50-55%, no rwma, Gr2 DD,  mildly dil LA, mild MR.  ? CAD (coronary artery disease)   ? a. Pt reports in 2000 she had a stent to a coronary artery, details unclear, outside hospital.  ? Carotid stenosis   ? a. CT angio head 07/2014 - 50-60% stenoses of prox ICA bilaterally.  ? CKD (chronic kidney disease), stage III ([place])   ? Diabetes mellitus ([place])   ? Denies.  ? Edentulous   ? No upper teeth  ? Family hx of colon cancer 06/13/2018  ? Father diagnosed in his 57's  ? Gout   ? HOH (hard of hearing)   ? usually needs to read lips  ? Hyperlipidemia   ? Hypertension   ? Orthostatic hypotension   ? Osteopenia 01/24/2018  ? DEXA Sept 2019  ? PONV (postoperative nausea and vomiting)   ? PTSD (post-traumatic stress disorder)   ?    ?  PUD (peptic ulcer disease)   ? PVC's (premature ventricular contractions)   ? Seizures ([place])   ? Sinus bradycardia   ? Stroke [facility])   ? a. L PCA CVA in 05/2014.  ? Syncope   ? Weakness   ? left side S/P stroke  ? ? ?Past Surgical History:  ?Procedure Laterality Date  ? BLADDER SURGERY    ? CARDIAC CATHETERIZATION    ? CATARACT EXTRACTION W/PHACO Left 03/21/2021  ? Procedure: CATARACT EXTRACTION PHACO AND INTRAOCULAR LENS PLACEMENT ([place]) left 5.86 00:38.0;  Surgeon: Birder Robson, MD;  Location: [place];  Service: Ophthalmology;  Laterality: Left;  Latex  ? CESAREAN SECTION    ? DENTAL SURGERY    ? Heart stent    ? stint    ? ? ?Family History  ?Problem Relation Age of Onset  ? Stroke Mother   ? Heart attack Mother   ? Lung cancer Mother   ? Stroke Father   ? Prostate cancer Father   ? Colon cancer Father   ? Diabetes  Mellitus II Daughter   ? Multiple sclerosis Daughter   ? Kidney disease Son   ? Heart failure Son   ? Cancer Paternal Grandfather   ? ? ?Social History  ? ?Socioeconomic History  ? Marital status: Single  ?  Spouse name: Not on file  ? Number of children: 2  ? Years of education: 59  ? Highest education level: Not on file  ?Occupational History  ? Occupation: Disabled  ?Tobacco Use  ? Smoking status: Former  ?  Types: Cigarettes  ?  Quit date: 03/30/2014  ?  Years since quitting: 7.3  ? Smokeless tobacco: Never  ?Vaping Use  ? Vaping Use: Never used  ?Substance and Sexual Activity  ? Alcohol use: No  ? Drug use: No  ? Sexual activity: Not Currently  ?Other Topics Concern  ? Not on file  ?Social History Narrative  ? Lives at home with her son's father.  ? Right-handed.  ? 1 cup coffee per day.  ? ?Social Determinants of Health  ? ?Financial Resource Strain: High Risk  ? Difficulty of Paying Living Expenses: Hard  ?Food Insecurity: Food Insecurity Present  ? Worried About Charity fundraiser in the Last Year: Sometimes true  ? Ran Out of Food in the Last Year: Never true  ?Transportation Needs: No Transportation Needs  ? Lack of Transportation (Medical): No  ? Lack of Transportation (Non-Medical): No  ?Physical Activity: Inactive  ? Days of Exercise per Week: 0 days  ? Minutes of Exercise per Session: 0 min  ?Stress: Stress Concern Present  ? Feeling of Stress : Rather much  ?Social Connections: Moderately Isolated  ? Frequency of Communication with Friends and Family: More than three times a week  ? Frequency of Social Gatherings with Friends and Family: Three times a week  ? Attends Religious Services: More than 4 times per year  ? Active Member of Clubs or Organizations: No  ? Attends Archivist Meetings: Never  ? Marital Status: Divorced  ?Intimate Partner Violence: Not At Risk  ? Fear of Current or Ex-Partner: No  ? Emotionally Abused: No  ? Physically Abused: No  ? Sexually Abused: No  ? ? ?Outpatient  Medications Prior to Visit  ?Medication Sig Dispense Refill  ? amLODipine (NORVASC) 5 MG tablet Take 5 mg by mouth daily.    ? ASPIRIN LOW DOSE 81 MG EC tablet TAKE 1 TABLET BY MOUTH  ONCE DAILY *SWALLOW WHOLE* 30 tablet 11  ? Cholecalciferol (VITAMIN D3) 25 MCG (1000 UT) CAPS Take 1 capsule by mouth daily.    ? citalopram (CELEXA) 40 MG tablet Take 1 tablet (40 mg total) by mouth daily. 30 tablet 3  ? ezetimibe (ZETIA) 10 MG tablet TAKE 1 TABLET BY MOUTH ONCE A DAY 90 tablet 3  ? ferrous sulfate 325 (65 FE) MG tablet Take 325 mg by mouth daily with breakfast.    ? fluticasone (FLONASE) 50 MCG/ACT nasal spray Place 2 sprays into both nostrils daily as needed for allergies (allergies).     ? hydrALAZINE (APRESOLINE) 10 MG tablet TAKE 1 TABLET BY MOUTH TWICE A DAY 180 tablet 3  ? levETIRAcetam (KEPPRA) 500 MG tablet TAKE 1 TABLET BY MOUTH IN THE MORNING AND 2 TABLETS BY MOUTH AT NIGHT. 270 tablet 3  ? loratadine (CLARITIN) 10 MG tablet TAKE 1 TABLET BY MOUTH ONCE A DAY AS NEEDED FOR ALLERGIES 30 tablet 3  ? tamsulosin (FLOMAX) 0.4 MG CAPS capsule Take 1 capsule (0.4 mg total) by mouth daily. 90 capsule 0  ? torsemide (DEMADEX) 20 MG tablet Take 1 tablet (20 mg total) by mouth daily. 90 tablet 1  ? ?No facility-administered medications prior to visit.  ? ? ?Allergies  ?Allergen Reactions  ? Latex Rash  ? Penicillin G Itching  ? Atorvastatin Other (See Comments)  ?  Myalgias   ? Other Hives  ?  Adhesive tape  ? Amlodipine Swelling  ?  Leg swelling  ? Doxycycline   ?  Blood in urine  ? ? ?ROS ?Review of Systems ? ?  ?Objective:  ?  ?Physical Exam ? ?There were no vitals taken for this visit. ?Wt Readings from Last 3 Encounters:  ?06/28/21 195 lb 12.8 oz (88.8 kg)  ?03/21/21 198 lb (89.8 kg)  ?02/14/21 197 lb 12.8 oz (89.7 kg)  ? ? ? ?Health Maintenance Due  ?Topic Date Due  ? Zoster Vaccines- Shingrix (1 of 2) Never done  ? MAMMOGRAM  02/13/2019  ? COVID-19 Vaccine (2 - Moderna series) 02/23/2020  ? ? ?There are no  preventive care reminders to display for this patient. ? ?Lab Results  ?Component Value Date  ? TSH 2.892 07/02/2019  ? ?Lab Results  ?Component Value Date  ? WBC 5.5 12/02/2020  ? HGB 11.5 (L) 12/02/2020  ? HCT

## 2021-08-18 ENCOUNTER — Encounter: Payer: Self-pay | Admitting: *Deleted

## 2021-08-18 ENCOUNTER — Ambulatory Visit (INDEPENDENT_AMBULATORY_CARE_PROVIDER_SITE_OTHER): Payer: Medicare Other | Admitting: *Deleted

## 2021-08-18 DIAGNOSIS — F419 Anxiety disorder, unspecified: Secondary | ICD-10-CM

## 2021-08-18 DIAGNOSIS — I10 Essential (primary) hypertension: Secondary | ICD-10-CM

## 2021-08-18 DIAGNOSIS — F331 Major depressive disorder, recurrent, moderate: Secondary | ICD-10-CM

## 2021-08-18 NOTE — Progress Notes (Signed)
This encounter was created in error - please disregard.

## 2021-08-18 NOTE — Chronic Care Management (AMB) (Signed)
?Chronic Care Management  ? ? Clinical Social Work Note ? ?08/18/2021 ?Name: Leslie Houston MRN: [id number] DOB: Mar 07, 1951 ? ?Leslie Houston is a 71 y.o. year old female who is a primary care patient of Teodora Medici, DO. The CCM team was consulted to assist the patient with chronic disease management and/or care coordination needs related to: Mental Health Counseling and Resources.  ? ?Collaboration with multiple mental health programs  for  possible linkage to ongoing mental health cousneling  in response to provider referral for social work chronic care management and care coordination services.  ? ?Consent to Services:  ?The patient was given information about Chronic Care Management services, agreed to services, and gave verbal consent prior to initiation of services.  Please see initial visit note for detailed documentation.  ? ?Patient agreed to services and consent obtained.  ? ?Assessment: Review of patient past medical history, allergies, medications, and health status, including review of relevant consultants reports was performed today as part of a comprehensive evaluation and provision of chronic care management and care coordination services.    ? ?SDOH (Social Determinants of Health) assessments and interventions performed:   ? ?Advanced Directives Status: Not addressed in this encounter. ? ?CCM Care Plan ? ?Allergies  ?Allergen Reactions  ? Latex Rash  ? Penicillin G Itching  ? Atorvastatin Other (See Comments)  ?  Myalgias   ? Other Hives  ?  Adhesive tape  ? Amlodipine Swelling  ?  Leg swelling  ? Doxycycline   ?  Blood in urine  ? ? ?Outpatient Encounter Medications as of 08/18/2021  ?Medication Sig  ? amLODipine (NORVASC) 5 MG tablet Take 5 mg by mouth daily.  ? ASPIRIN LOW DOSE 81 MG EC tablet TAKE 1 TABLET BY MOUTH ONCE DAILY *SWALLOW WHOLE*  ? Cholecalciferol (VITAMIN D3) 25 MCG (1000 UT) CAPS Take 1 capsule by mouth daily.  ? citalopram (CELEXA) 40 MG tablet Take 1 tablet (40 mg  total) by mouth daily.  ? ezetimibe (ZETIA) 10 MG tablet TAKE 1 TABLET BY MOUTH ONCE A DAY  ? ferrous sulfate 325 (65 FE) MG tablet Take 325 mg by mouth daily with breakfast.  ? fluticasone (FLONASE) 50 MCG/ACT nasal spray Place 2 sprays into both nostrils daily as needed for allergies (allergies).   ? hydrALAZINE (APRESOLINE) 10 MG tablet TAKE 1 TABLET BY MOUTH TWICE A DAY  ? levETIRAcetam (KEPPRA) 500 MG tablet TAKE 1 TABLET BY MOUTH IN THE MORNING AND 2 TABLETS BY MOUTH AT NIGHT.  ? loratadine (CLARITIN) 10 MG tablet TAKE 1 TABLET BY MOUTH ONCE A DAY AS NEEDED FOR ALLERGIES  ? tamsulosin (FLOMAX) 0.4 MG CAPS capsule Take 1 capsule (0.4 mg total) by mouth daily.  ? torsemide (DEMADEX) 20 MG tablet Take 1 tablet (20 mg total) by mouth daily.  ? ?No facility-administered encounter medications on file as of 08/18/2021.  ? ? ?Patient Active Problem List  ? Diagnosis Date Noted  ? Moderate episode of recurrent major depressive disorder ([place]) 06/28/2021  ? Statin myopathy 03/09/2020  ? Lymphedema 03/09/2020  ? Partial symptomatic epilepsy with complex partial seizures, not intractable, without status epilepticus ([place]) 02/18/2020  ? Bad dreams 02/18/2020  ? Chronic post-traumatic stress disorder (PTSD) 02/18/2020  ? Snoring 02/18/2020  ? Coronary artery disease 02/18/2020  ? Stroke [facility])   ? Debility 07/07/2019  ? Hyperkalemia 07/03/2019  ? AKI (acute kidney injury) ([place]) 07/02/2019  ? Anemia of chronic disease 07/02/2019  ? Bradycardia 07/02/2019  ? Acute CHF (congestive  heart failure) ([place]) 07/02/2019  ? 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 History of diabetes mellitus 01/22/2019  ? Osteopenia 01/24/2018  ? Anxiety 12/03/2017  ? Neutropenia ([place]) 12/31/2015  ? Seizures ([place])   ? Carotid stenosis   ? PVC's (premature ventricular contractions)   ? Hemiparesis affecting right side as late effect of cerebrovascular accident (CVA) ([place]) 08/19/2014  ? CKD (chronic kidney disease), stage IV ([place]) 08/19/2014  ? Morbid obesity  ([place]) 03/04/2006  ? Essential hypertension 03/04/2006  ? GERD 03/04/2006  ? History of gastric ulcer 03/04/2006  ? ? ?Conditions to be addressed/monitored: Depression; Mental Health Concerns  ? ?Care Plan : General Social Work (Adult)  ?Updates made by Vern Claude, LCSW since 08/18/2021 12:00 AM  ?  ? ?Problem: CHL AMB "PATIENT-SPECIFIC PROBLEM"   ?Note:   ?CARE PLAN ENTRY ?(see longitudinal plan of care for additional care plan information) ? ?Current Barriers:  ?Knowledge deficits related to accessing mental health provider in patient with Depression and Trauma History   ?Patient is experiencing symptoms of  depression which seem to be exacerbated by unresolved trauma.     ?Patient needs Support, Education, and Care Coordination in order to meet unmet mental health needs  ?Limited social support, Mental Health Concerns , Social Isolation, and Lacks knowledge of community resource: mental health follow up ? ?Clinical Social Work Goal(s):  ?Over the next 90 days, patient will work with SW bi-weekly by telephone or in person to reduce or manage symptoms of depression until connected for ongoing counseling resources.  ? ?Interventions:  ?Assessed patient's understanding, education, previous treatment and care coordination needs  ?Provided basic caregiver support, education and interventions  ?Collaborated with appropriate clinical care team members regarding patient needs ?Discussed several options for long term counseling based on need and insurance, trauma focused care discussed, patient's friend agreeable-multiple mental health programs contacted to discus possible follow up however no success at this time in finding an agency that has openings-referral to outpatient behavioral health through [facility] not able to take patient for therapy only-[facility] contacted-VM left requesting a return call ?Other interventions include: Solution-Focused Strategies employed:  ?Active listening / Reflection  utilized   ?Caregiver Stress Acknowledged ?08/18/21 update [facility] is not taking patient's at this time. Additional mental health agencies contacted [facility] contacted, they are accepting new patients-referral completed. They will contact patients caregiver [name] to schedule initial appointment. Phone call to caregiver [name], no answer could not leave message as voicemail was full. ? ?Patient Self Care Activities & Deficits:  ?Patient is unable to independently navigate community resource options without care coordination support ?Patient is able to implement clinical interventions discussed today and is motivated for treatment  ?Patient will select one of the agencies from the list provided and call to schedule an appointment  ?Performs ADL's independently ?Motivation for treatment ? ?Please see past updates related to this goal by clicking on the "Past Updates" button in the selected goal  ? ?  ?  ? ?Follow Up Plan: SW will follow up with patient by phone over the next 14 business days ?     ?Ikesha Siller, LCSW ?Clinical Social Worker  ?[facility] ?[phone] ? ? ? ?

## 2021-08-21 ENCOUNTER — Telehealth: Payer: Self-pay

## 2021-08-21 NOTE — Telephone Encounter (Signed)
Copied from [place] [id number]. Topic: General - Other >> Aug 21, 2021  4:21 PM Tessa Lerner A wrote: Reason for CRM: The patient's caregiver has returned a missed call from [place]   Please contact further when possible

## 2021-08-22 ENCOUNTER — Telehealth: Payer: Self-pay | Admitting: *Deleted

## 2021-08-22 DIAGNOSIS — Z20822 Contact with and (suspected) exposure to covid-19: Secondary | ICD-10-CM | POA: Diagnosis not present

## 2021-08-22 NOTE — Telephone Encounter (Signed)
?  Care Management  ? ?Follow Up Note ? ? ?08/22/2021 ?Name: Leslie Houston MRN: [id number] DOB: February 14, 1951 ? ? ?Referred by: Teodora Medici, DO ?Reason for referral : No chief complaint on file. ? ? ?A second unsuccessful telephone outreach was attempted today. The patient was referred to the case management team for assistance with care management and care coordination.  ? ?Follow Up Plan: Telephone follow up appointment with care management team member scheduled for: 08/24/21 4pm ? ?Vlasta Baskin, LCSW ?Clinical Social Worker  ?[facility] ?[phone] ? ? ?

## 2021-08-24 ENCOUNTER — Ambulatory Visit (INDEPENDENT_AMBULATORY_CARE_PROVIDER_SITE_OTHER): Payer: Medicare Other | Admitting: Internal Medicine

## 2021-08-24 ENCOUNTER — Encounter: Payer: Self-pay | Admitting: Internal Medicine

## 2021-08-24 ENCOUNTER — Ambulatory Visit: Payer: Self-pay | Admitting: *Deleted

## 2021-08-24 VITALS — BP 122/62 | HR 63 | Temp 97.5°F | Resp 16 | Ht 59.0 in | Wt 193.3 lb

## 2021-08-24 DIAGNOSIS — I10 Essential (primary) hypertension: Secondary | ICD-10-CM

## 2021-08-24 DIAGNOSIS — R569 Unspecified convulsions: Secondary | ICD-10-CM

## 2021-08-24 DIAGNOSIS — Z8673 Personal history of transient ischemic attack (TIA), and cerebral infarction without residual deficits: Secondary | ICD-10-CM | POA: Diagnosis not present

## 2021-08-24 DIAGNOSIS — F331 Major depressive disorder, recurrent, moderate: Secondary | ICD-10-CM | POA: Diagnosis not present

## 2021-08-24 DIAGNOSIS — K219 Gastro-esophageal reflux disease without esophagitis: Secondary | ICD-10-CM | POA: Diagnosis not present

## 2021-08-24 DIAGNOSIS — N184 Chronic kidney disease, stage 4 (severe): Secondary | ICD-10-CM

## 2021-08-24 MED ORDER — CITALOPRAM HYDROBROMIDE 10 MG PO TABS: 10.0000 mg | ORAL_TABLET | Freq: Every day | ORAL | 0 refills | Status: DC

## 2021-08-24 MED ORDER — PANTOPRAZOLE SODIUM 40 MG PO TBEC: 40.0000 mg | DELAYED_RELEASE_TABLET | Freq: Every day | ORAL | 1 refills | Status: DC
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
## 2021-08-24 NOTE — Patient Instructions (Addendum)
It was great seeing you today! ? ?Plan discussed at today's visit: ?-Restart Celexa 10 for 2 weeks, then increase to 20 mg daily until follow up ?-Protonix sent to pharmacy to take for acid reflux  ? ?Follow up in: 6 weeks  ? ?Take care and let us know if you have any questions or concerns prior to your next visit. ? ?Dr. Rosana Berger ? ?

## 2021-08-24 NOTE — Assessment & Plan Note (Signed)
Blood pressure stable today.  Continue hydralazine 10 mg 3 times daily, torsemide 20 mg daily.  Continuing to follow with cardiology for CHF, last note reviewed from 02/14/2021. ?

## 2021-08-24 NOTE — Assessment & Plan Note (Signed)
New medication Protonix 40 mg sent to pharmacy.  Discussed lifestyle modification, such as avoiding triggering foods and elevating the bed at night.  Plan to follow-up in 6 weeks to decrease dose as tolerated. ?

## 2021-08-24 NOTE — Assessment & Plan Note (Addendum)
Stable overall.  Continue Zetia and aspirin, she has a history of statin intolerance.  Continue to modify risk factors. ?

## 2021-08-24 NOTE — Assessment & Plan Note (Addendum)
Stable.  No seizure breakthrough activity.  Continue Keppra 500 mg in the morning and 1000 mg at night.  Following with neurology. ?

## 2021-08-24 NOTE — Assessment & Plan Note (Signed)
Stable.  Following with nephrology, last seen on 08/29/20.  Labs from August stable we will continue to follow ?

## 2021-08-25 ENCOUNTER — Ambulatory Visit: Payer: Self-pay | Admitting: *Deleted

## 2021-08-25 DIAGNOSIS — F331 Major depressive disorder, recurrent, moderate: Secondary | ICD-10-CM

## 2021-08-25 DIAGNOSIS — I10 Essential (primary) hypertension: Secondary | ICD-10-CM
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
## 2021-08-25 NOTE — Patient Instructions (Signed)
Visit Information ? ?Thank you for taking time to visit with me today. Please don't hesitate to contact me if I can be of assistance to you before our next scheduled telephone appointment. ? ?Following are the goals we discussed today:  ? ?- begin a notebook of services in my neighborhood or community ?- call 211 when I need some help ?- follow-up on any referrals for help I am given ?- think ahead to make sure my need does not become an emergency ?- make a list of family or friends that I can call  ?-call 911 in case of an emergency or if you are feeling unsafe ? ?Our next appointment is by telephone on 08/25/21 at 3pm ? ?Please call the care guide team at [phone] if you need to cancel or reschedule your appointment.  ? ?If you are experiencing a Mental Health or [place] or need someone to talk to, please call the Suicide and Crisis Lifeline: 988  ? ?Patient verbalizes understanding of instructions and care plan provided today and agrees to view in [place]. Active MyChart status confirmed with patient.   ? ?Telephone follow up appointment with care management team member scheduled for: 08/25/21 ? ?Mikela Senn, LCSW ?Clinical Social Worker  ?[facility] Care Management ?[phone] ? ?

## 2021-08-26 NOTE — Patient Instructions (Signed)
Visit Information ? ?Thank you for taking time to visit with me today. Please don't hesitate to contact me if I can be of assistance to you before our next scheduled telephone appointment. ? ?Following are the goals we discussed today:  ? ?- begin a notebook of services in my neighborhood or community ?- call 211 when I need some help ?- follow-up on any referrals for help I am given ?- think ahead to make sure my need does not become an emergency ?- make a list of family or friends that I can call  ?-call 911 in case of an emergency or if you are feeling unsafe ? ?Our next appointment is by telephone on 08/28/21 at 3:00pm ? ?Please call the care guide team at [phone] if you need to cancel or reschedule your appointment.  ? ?If you are experiencing a Mental Health or [place] or need someone to talk to, please call the Suicide and Crisis Lifeline: 988  ? ?Patient verbalizes understanding of instructions and care plan provided today and agrees to view in [place]. Active MyChart status confirmed with patient.   ? ?Telephone follow up appointment with care management team member scheduled for: 08/28/21 ? ?Nilam Quakenbush, LCSW ?Clinical Social Worker  ?[facility] Care Management ?[phone] ? ?

## 2021-08-26 NOTE — Chronic Care Management (AMB) (Signed)
?Chronic Care Management  ? ? Clinical Social Work Note ? ?08/26/2021 ?Name: Kandace Elrod MRN: [id number] DOB: 1951/04/14 ? ?Teela Narducci is a 71 y.o. year old female who is a primary care patient of Teodora Medici, DO. The CCM team was consulted to assist the patient with chronic disease management and/or care coordination needs related to: [employer]  and [place] [employer].  ? ?Engaged with patient by telephone for follow up visit in response to provider referral for social work chronic care management and care coordination services.  ? ?Consent to Services:  ?The patient was given information about Chronic Care Management services, agreed to services, and gave verbal consent prior to initiation of services.  Please see initial visit note for detailed documentation.  ? ?Patient agreed to services and consent obtained.  ? ?Assessment: Review of patient past medical history, allergies, medications, and health status, including review of relevant consultants reports was performed today as part of a comprehensive evaluation and provision of chronic care management and care coordination services.    ? ?SDOH (Social Determinants of Health) assessments and interventions performed:   ? ?Advanced Directives Status: Not addressed in this encounter. ? ?CCM Care Plan ? ?Allergies  ?Allergen Reactions  ? Latex Rash  ? Penicillin G Itching  ? Atorvastatin Other (See Comments)  ?  Myalgias   ? Other Hives  ?  Adhesive tape  ? Amlodipine Swelling  ?  Leg swelling  ? Doxycycline   ?  Blood in urine  ? ? ?Outpatient Encounter Medications as of 08/25/2021  ?Medication Sig  ? ASPIRIN LOW DOSE 81 MG EC tablet TAKE 1 TABLET BY MOUTH ONCE DAILY *SWALLOW WHOLE*  ? Cholecalciferol (VITAMIN D3) 25 MCG (1000 UT) CAPS Take 1 capsule by mouth daily.  ? citalopram (CELEXA) 10 MG tablet Take 1 tablet (10 mg total) by mouth daily.  ? ezetimibe (ZETIA) 10 MG tablet TAKE 1 TABLET BY MOUTH ONCE A DAY  ?  ferrous sulfate 325 (65 FE) MG tablet Take 325 mg by mouth daily with breakfast.  ? fluticasone (FLONASE) 50 MCG/ACT nasal spray Place 2 sprays into both nostrils daily as needed for allergies (allergies).   ? hydrALAZINE (APRESOLINE) 10 MG tablet TAKE 1 TABLET BY MOUTH TWICE A DAY  ? levETIRAcetam (KEPPRA) 500 MG tablet TAKE 1 TABLET BY MOUTH IN THE MORNING AND 2 TABLETS BY MOUTH AT NIGHT.  ? loratadine (CLARITIN) 10 MG tablet TAKE 1 TABLET BY MOUTH ONCE A DAY AS NEEDED FOR ALLERGIES  ? pantoprazole (PROTONIX) 40 MG tablet Take 1 tablet (40 mg total) by mouth daily.  ? tamsulosin (FLOMAX) 0.4 MG CAPS capsule Take 1 capsule (0.4 mg total) by mouth daily.  ? torsemide (DEMADEX) 20 MG tablet Take 1 tablet (20 mg total) by mouth daily.  ? ?No facility-administered encounter medications on file as of 08/25/2021.  ? ? ?Patient Active Problem List  ? Diagnosis Date Noted  ? History of CVA (cerebrovascular accident) 08/24/2021  ? Moderate episode of recurrent major depressive disorder ([place]) 06/28/2021  ? Statin myopathy 03/09/2020  ? Lymphedema 03/09/2020  ? Partial symptomatic epilepsy with complex partial seizures, not intractable, without status epilepticus ([place]) 02/18/2020  ? Bad dreams 02/18/2020  ? Chronic post-traumatic stress disorder (PTSD) 02/18/2020  ? Snoring 02/18/2020  ? Coronary artery disease 02/18/2020  ? Stroke [facility])   ? Debility 07/07/2019  ? Hyperkalemia 07/03/2019  ? AKI (acute kidney injury) ([place]) 07/02/2019  ? Anemia of chronic disease 07/02/2019  ?  Bradycardia 07/02/2019  ? Acute CHF (congestive heart failure) ([place]) 07/02/2019  ? 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 History of diabetes mellitus 01/22/2019  ? Osteopenia 01/24/2018  ? Anxiety 12/03/2017  ? Neutropenia ([place]) 12/31/2015  ? Seizures ([place])   ? Carotid stenosis   ? PVC's (premature ventricular contractions)   ? Hemiparesis affecting right side as late effect of cerebrovascular accident (CVA) ([place]) 08/19/2014  ? CKD (chronic kidney disease),  stage IV ([place]) 08/19/2014  ? Morbid obesity ([place]) 03/04/2006  ? Essential hypertension 03/04/2006  ? GERD 03/04/2006  ? History of gastric ulcer 03/04/2006  ? ? ?Conditions to be addressed/monitored: Anxiety and Depression; Mental Health Concerns  ? ?Care Plan : General Social Work (Adult)  ?Updates made by Vern Claude, LCSW since 08/26/2021 12:00 AM  ?  ? ?Problem: CHL AMB "PATIENT-SPECIFIC PROBLEM"   ?Note:   ?CARE PLAN ENTRY ?(see longitudinal plan of care for additional care plan information) ? ?Current Barriers:  ?Knowledge deficits related to accessing mental health provider in patient with Depression and Trauma History   ?Patient is experiencing symptoms of  depression which seem to be exacerbated by unresolved trauma.     ?Patient needs Support, Education, and Care Coordination in order to meet unmet mental health needs  ?Limited social support, Mental Health Concerns , Social Isolation, and Lacks knowledge of community resource: mental health follow up ? ?Clinical Social Work Goal(s):  ?Over the next 90 days, patient will work with SW bi-weekly by telephone or in person to reduce or manage symptoms of depression until connected for ongoing counseling resources.  ? ?Interventions:  ?Assessed patient's understanding, education, previous treatment and care coordination needs  ?Provided basic caregiver support, education and interventions  ?Collaborated with appropriate clinical care team members regarding patient needs ?Discussed several options for long term counseling based on need and insurance, trauma focused care discussed, patient's friend agreeable-multiple mental health programs contacted to discus possible follow up however no success at this time in finding an agency that has openings-referral to outpatient behavioral health through [facility] not able to take patient for therapy only-[facility] contacted-VM left requesting a return call ?Other interventions include: Solution-Focused  Strategies employed:  ?Active listening / Reflection utilized   ?Caregiver Stress Acknowledged ?08/18/21 update [facility] is not taking patient's at this time. Additional mental health agencies contacted [facility] contacted, they are accepting new patients-referral completed. They will contact patients caregiver [name] to schedule initial appointment. Phone call to caregiver [name], no answer could not leave message as voicemail was full. ?08/24/21 spoke with patient's caregiver [name], regarding mental health follow up for patient. [facility] identified for possible follow up. Discussed this with caregiver who is agreeable to transporting patient there but would like to wait until patient completes visits with various specialists.   This Education officer, museum later received message from patient's provider who stated that during patient's appointment patient discussed feeling unsafe in the home. [name] contacted who stated that her roommates son resides with them in the evening and refuses to leave. Per [name], she has asked him multiple times to leave, however he refuses. Contacting the police and the management company discussed as patient lives in low income housing, only individuals on the lease are able to reside there. Phone call to patient who states that she does fear the roommates son and she does not want him there. She denies that he is physically harmful, however he takes advantage by ignoring her request to leave the home.  Per patient, he is intimidating and she fears retaliation.  This social worker encouraged patient to contact the authorities and/or [employer], however she is reluctant stating "I don't know how to do that" She is agreeable to contacting the [place] [phone] by way of conference call tomorrow. Patient's provider has confirmed call to APS. Patient's challenges in the home discussed with Leadership. ?08/25/21 phone call to  patient to follow up on challenges with roommates son. Family friend present during visit and states plan to assist patient with discussing her concerns with the buildings management company as well as filing

## 2021-08-27 DIAGNOSIS — F331 Major depressive disorder, recurrent, moderate: Secondary | ICD-10-CM

## 2021-08-27 DIAGNOSIS — I10 Essential (primary) hypertension: Secondary | ICD-10-CM

## 2021-08-28 ENCOUNTER — Telehealth: Payer: Medicare Other

## 2021-08-28 ENCOUNTER — Telehealth: Payer: Self-pay | Admitting: *Deleted

## 2021-08-28 NOTE — Telephone Encounter (Signed)
?  Care Management  ? ?Follow Up Note ? ? ?08/28/2021 ?Name: Leslie Houston MRN: [id number] DOB: 26-Jan-1951 ? ? ?Referred by: Teodora Medici, DO ?Reason for referral : No chief complaint on file. ? ? ?An unsuccessful telephone outreach was attempted today. The patient was referred to the case management team for assistance with care management and care coordination.  ? ?Follow Up Plan: Telephone follow up appointment with care management team member scheduled for:  09/01/21 ? ?Lakishia Bourassa, LCSW ?Clinical Social Worker  ?[facility] ?[phone] ? ? ?

## 2021-09-01 ENCOUNTER — Ambulatory Visit (INDEPENDENT_AMBULATORY_CARE_PROVIDER_SITE_OTHER): Payer: Medicare Other | Admitting: *Deleted

## 2021-09-01 DIAGNOSIS — I10 Essential (primary) hypertension: Secondary | ICD-10-CM

## 2021-09-01 DIAGNOSIS — F331 Major depressive disorder, recurrent, moderate: Secondary | ICD-10-CM

## 2021-09-01 NOTE — Patient Instructions (Addendum)
Visit Information ? ?Thank you for taking time to visit with me today. Please don't hesitate to contact me if I can be of assistance to you before our next scheduled telephone appointment. ? ?Following are the goals we discussed today:  ? ?- begin a notebook of services in my neighborhood or community ?- call 211 when I need some help ?- follow-up on any referrals for help I am given ?- think ahead to make sure my need does not become an emergency ?- make a list of family or friends that I can call  ?-call 911 in case of an emergency or if you are feeling unsafe ?-consider follow up with management for assistance with unwanted guest ie police involvement, retraining order ? ?Our next appointment is by telephone on 09/22/21 at 11am ? ?Please call the care guide team at [phone] if you need to cancel or reschedule your appointment.  ? ?If you are experiencing a Mental Health or [place] or need someone to talk to, please call the Suicide and Crisis Lifeline: 988  ? ?Patient verbalizes understanding of instructions and care plan provided today and agrees to view in [place]. Active MyChart status confirmed with patient.   ? ?Telephone follow up appointment with care management team member scheduled for: ? ?Therasa Lorenzi, LCSW ?Clinical Social Worker  ?[facility] ?[phone] ? ?

## 2021-09-02 DIAGNOSIS — Z20822 Contact with and (suspected) exposure to covid-19: Secondary | ICD-10-CM | POA: Diagnosis not present

## 2021-09-04 NOTE — Chronic Care Management (AMB) (Signed)
?Chronic Care Management  ? ? Clinical Social Work Note ? ?09/04/2021 ?Name: Leslie Houston MRN: [id number] DOB: 1950-07-15 ? ?Leslie Houston is a 71 y.o. year old female who is a primary care patient of Teodora Medici, DO. The CCM team was consulted to assist the patient with chronic disease management and/or care coordination needs related to: Mental Health Counseling and Resources.  ? ?Engaged with patient by telephone for follow up visit in response to provider referral for social work chronic care management and care coordination services.  ? ?Consent to Services:  ?The patient was given information about Chronic Care Management services, agreed to services, and gave verbal consent prior to initiation of services.  Please see initial visit note for detailed documentation.  ? ?Patient agreed to services and consent obtained.  ? ?Assessment: Review of patient past medical history, allergies, medications, and health status, including review of relevant consultants reports was performed today as part of a comprehensive evaluation and provision of chronic care management and care coordination services.    ? ?SDOH (Social Determinants of Health) assessments and interventions performed:   ? ?Advanced Directives Status: Not addressed in this encounter. ? ?CCM Care Plan ? ?Allergies  ?Allergen Reactions  ? Latex Rash  ? Penicillin G Itching  ? Atorvastatin Other (See Comments)  ?  Myalgias   ? Other Hives  ?  Adhesive tape  ? Amlodipine Swelling  ?  Leg swelling  ? Doxycycline   ?  Blood in urine  ? ? ?Outpatient Encounter Medications as of 09/01/2021  ?Medication Sig  ? ASPIRIN LOW DOSE 81 MG EC tablet TAKE 1 TABLET BY MOUTH ONCE DAILY *SWALLOW WHOLE*  ? Cholecalciferol (VITAMIN D3) 25 MCG (1000 UT) CAPS Take 1 capsule by mouth daily.  ? citalopram (CELEXA) 10 MG tablet Take 1 tablet (10 mg total) by mouth daily.  ? ezetimibe (ZETIA) 10 MG tablet TAKE 1 TABLET BY MOUTH ONCE A DAY  ? ferrous sulfate 325 (65 FE) MG  tablet Take 325 mg by mouth daily with breakfast.  ? fluticasone (FLONASE) 50 MCG/ACT nasal spray Place 2 sprays into both nostrils daily as needed for allergies (allergies).   ? hydrALAZINE (APRESOLINE) 10 MG tablet TAKE 1 TABLET BY MOUTH TWICE A DAY  ? levETIRAcetam (KEPPRA) 500 MG tablet TAKE 1 TABLET BY MOUTH IN THE MORNING AND 2 TABLETS BY MOUTH AT NIGHT.  ? loratadine (CLARITIN) 10 MG tablet TAKE 1 TABLET BY MOUTH ONCE A DAY AS NEEDED FOR ALLERGIES  ? pantoprazole (PROTONIX) 40 MG tablet Take 1 tablet (40 mg total) by mouth daily.  ? tamsulosin (FLOMAX) 0.4 MG CAPS capsule Take 1 capsule (0.4 mg total) by mouth daily.  ? torsemide (DEMADEX) 20 MG tablet Take 1 tablet (20 mg total) by mouth daily.  ? ?No facility-administered encounter medications on file as of 09/01/2021.  ? ? ?Patient Active Problem List  ? Diagnosis Date Noted  ? History of CVA (cerebrovascular accident) 08/24/2021  ? Moderate episode of recurrent major depressive disorder ([place]) 06/28/2021  ? Statin myopathy 03/09/2020  ? Lymphedema 03/09/2020  ? Partial symptomatic epilepsy with complex partial seizures, not intractable, without status epilepticus ([place]) 02/18/2020  ? Bad dreams 02/18/2020  ? Chronic post-traumatic stress disorder (PTSD) 02/18/2020  ? Snoring 02/18/2020  ? Coronary artery disease 02/18/2020  ? Stroke [facility])   ? Debility 07/07/2019  ? Hyperkalemia 07/03/2019  ? AKI (acute kidney injury) ([place]) 07/02/2019  ? Anemia of chronic disease 07/02/2019  ? Bradycardia 07/02/2019  ?  Acute CHF (congestive heart failure) ([place]) 07/02/2019  ? 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 History of diabetes mellitus 01/22/2019  ? Osteopenia 01/24/2018  ? Anxiety 12/03/2017  ? Neutropenia ([place]) 12/31/2015  ? Seizures ([place])   ? Carotid stenosis   ? PVC's (premature ventricular contractions)   ? Hemiparesis affecting right side as late effect of cerebrovascular accident (CVA) ([place]) 08/19/2014  ? CKD (chronic kidney disease), stage IV ([place]) 08/19/2014  ?  Morbid obesity ([place]) 03/04/2006  ? Essential hypertension 03/04/2006  ? GERD 03/04/2006  ? History of gastric ulcer 03/04/2006  ? ? ?Conditions to be addressed/monitored:  ?Anxiety and Depression; Mental Health Concerns  ?Care Plan : General Social Work (Adult)  ?Updates made by Vern Claude, LCSW since 09/04/2021 12:00 AM  ?  ? ?Problem: CHL AMB "PATIENT-SPECIFIC PROBLEM"   ?Note:   ?CARE PLAN ENTRY ?(see longitudinal plan of care for additional care plan information) ? ?Current Barriers:  ?Knowledge deficits related to accessing mental health provider in patient with Depression and Trauma History   ?Patient is experiencing symptoms of  depression which seem to be exacerbated by unresolved trauma.     ?Patient needs Support, Education, and Care Coordination in order to meet unmet mental health needs  ?Limited social support, Mental Health Concerns , Social Isolation, and Lacks knowledge of community resource: mental health follow up ? ?Clinical Social Work Goal(s):  ?Over the next 90 days, patient will work with SW bi-weekly by telephone or in person to reduce or manage symptoms of depression until connected for ongoing counseling resources.  ? ?Interventions:  ?Assessed patient's understanding, education, previous treatment and care coordination needs  ?Provided basic caregiver support, education and interventions  ?Collaborated with appropriate clinical care team members regarding patient needs ?Discussed several options for long term counseling based on need and insurance, trauma focused care discussed, patient's friend agreeable-multiple mental health programs contacted to discus possible follow up however no success at this time in finding an agency that has openings-referral to outpatient behavioral health through [facility] not able to take patient for therapy only-[facility] contacted-VM left requesting a return call ?Other interventions include: Solution-Focused Strategies employed:  ?Active  listening / Reflection utilized   ?Caregiver Stress Acknowledged ?08/18/21 update [facility] is not taking patient's at this time. Additional mental health agencies contacted [facility] contacted, they are accepting new patients-referral completed. They will contact patients caregiver [name] to schedule initial appointment. Phone call to caregiver [name], no answer could not leave message as voicemail was full. ?08/24/21 spoke with patient's caregiver [name], regarding mental health follow up for patient. [facility] identified for possible follow up. Discussed this with caregiver who is agreeable to transporting patient there but would like to wait until patient completes visits with various specialists.   This Education officer, museum later received message from patient's provider who stated that during patient's appointment patient discussed feeling unsafe in the home. [name] contacted who stated that her roommates son resides with them in the evening and refuses to leave. Per [name], she has asked him multiple times to leave, however he refuses. Contacting the police and the management company discussed as patient lives in low income housing, only individuals on the lease are able to reside there. Phone call to patient who states that she does fear the roommates son and she does not want him there. She denies that he is physically harmful, however he takes advantage by ignoring her request to leave the home. Per patient, he is  intimidating and she fears retaliation.  This social worker encouraged patient to contact the authorities and/or [employer], however she is reluctant stating "I don't know how to do that" She is agreeable to contacting the [place] [phone] by way of conference call tomorrow. Patient's provider has confirmed call to APS. Patient's challenges in the home discussed with Leadership. ?08/25/21 phone call to patient to follow up on  challenges with roommates son. Family friend present during visit and states plan to assist patient with discussing her concerns with the buildings management company as well as filing a restraining order if needed

## 2021-09-22 ENCOUNTER — Telehealth: Payer: Medicare Other

## 2021-09-27 DIAGNOSIS — I10 Essential (primary) hypertension: Secondary | ICD-10-CM

## 2021-09-27 DIAGNOSIS — F331 Major depressive disorder, recurrent, moderate: Secondary | ICD-10-CM

## 2021-09-29 ENCOUNTER — Ambulatory Visit (INDEPENDENT_AMBULATORY_CARE_PROVIDER_SITE_OTHER): Payer: Medicare Other | Admitting: *Deleted

## 2021-09-29 DIAGNOSIS — F331 Major depressive disorder, recurrent, moderate: Secondary | ICD-10-CM

## 2021-09-29 DIAGNOSIS — I10 Essential (primary) hypertension: Secondary | ICD-10-CM

## 2021-09-29 NOTE — Patient Instructions (Signed)
Visit Information  Thank you for taking time to visit with me today. Please don't hesitate to contact me if I can be of assistance to you before our next scheduled telephone appointment.  Following are the goals we discussed today:   - begin a notebook of services in my neighborhood or community - call 211 when I need some help - follow-up on any referrals for help I am given - think ahead to make sure my need does not become an emergency - make a list of family or friends that I can call  -call 911 in case of an emergency or if you are feeling unsafe  Our next appointment is by telephone on 10/16/21 at 4pm  Please call the care guide team at [phone] if you need to cancel or reschedule your appointment.   If you are experiencing a Mental Health or [place] or need someone to talk to, please call the Suicide and Crisis Lifeline: 988   Patient verbalizes understanding of instructions and care plan provided today and agrees to view in [place]. Active MyChart status and patient understanding of how to access instructions and care plan via MyChart confirmed with patient.     Telephone follow up appointment with care management team member scheduled for: 10/16/21  Elliot Gurney, [place] Worker  [facility] [phone]

## 2021-09-29 NOTE — Chronic Care Management (AMB) (Signed)
Chronic Care Management    Clinical Social Work Note  09/29/2021 Name: Leslie Houston MRN: [id number] DOB: Jun 21, 1950  Leslie Houston is a 71 y.o. year old female who is a primary care patient of Teodora Medici, DO. The CCM team was consulted to assist the patient with chronic disease management and/or care coordination needs related to: Mental Health Counseling and Resources.   Collaboration with patient's caregiver [name]  for follow up visit in response to provider referral for social work chronic care management and care coordination services.   Consent to Services:  The patient was given information about Chronic Care Management services, agreed to services, and gave verbal consent prior to initiation of services.  Please see initial visit note for detailed documentation.   Patient agreed to services and consent obtained.   Assessment: Review of patient past medical history, allergies, medications, and health status, including review of relevant consultants reports was performed today as part of a comprehensive evaluation and provision of chronic care management and care coordination services.     SDOH (Social Determinants of Health) assessments and interventions performed:    Advanced Directives Status: Not addressed in this encounter.  CCM Care Plan  Allergies  Allergen Reactions   Latex Rash   Penicillin G Itching   Atorvastatin Other (See Comments)    Myalgias    Other Hives    Adhesive tape   Amlodipine Swelling    Leg swelling   Doxycycline     Blood in urine    Outpatient Encounter Medications as of 09/29/2021  Medication Sig   ASPIRIN LOW DOSE 81 MG EC tablet TAKE 1 TABLET BY MOUTH ONCE DAILY *SWALLOW WHOLE*   Cholecalciferol (VITAMIN D3) 25 MCG (1000 UT) CAPS Take 1 capsule by mouth daily.   citalopram (CELEXA) 10 MG tablet Take 1 tablet (10 mg total) by mouth daily.   ezetimibe (ZETIA) 10 MG tablet TAKE 1 TABLET BY MOUTH ONCE A DAY   ferrous sulfate 325  (65 FE) MG tablet Take 325 mg by mouth daily with breakfast.   fluticasone (FLONASE) 50 MCG/ACT nasal spray Place 2 sprays into both nostrils daily as needed for allergies (allergies).    hydrALAZINE (APRESOLINE) 10 MG tablet TAKE 1 TABLET BY MOUTH TWICE A DAY   levETIRAcetam (KEPPRA) 500 MG tablet TAKE 1 TABLET BY MOUTH IN THE MORNING AND 2 TABLETS BY MOUTH AT NIGHT.   loratadine (CLARITIN) 10 MG tablet TAKE 1 TABLET BY MOUTH ONCE A DAY AS NEEDED FOR ALLERGIES   pantoprazole (PROTONIX) 40 MG tablet Take 1 tablet (40 mg total) by mouth daily.   tamsulosin (FLOMAX) 0.4 MG CAPS capsule Take 1 capsule (0.4 mg total) by mouth daily.   torsemide (DEMADEX) 20 MG tablet Take 1 tablet (20 mg total) by mouth daily.   No facility-administered encounter medications on file as of 09/29/2021.    Patient Active Problem List   Diagnosis Date Noted   History of CVA (cerebrovascular accident) 08/24/2021   Moderate episode of recurrent major depressive disorder ([place]) 06/28/2021   Statin myopathy 03/09/2020   Lymphedema 03/09/2020   Partial symptomatic epilepsy with complex partial seizures, not intractable, without status epilepticus ([place]) 02/18/2020   Bad dreams 02/18/2020   Chronic post-traumatic stress disorder (PTSD) 02/18/2020   Snoring 02/18/2020   Coronary artery disease 02/18/2020   Stroke ([place])    Debility 07/07/2019   Hyperkalemia 07/03/2019   AKI (acute kidney injury) ([place]) 07/02/2019   Anemia of chronic disease 07/02/2019   Bradycardia 07/02/2019  Acute CHF (congestive heart failure) ([place]) 07/02/2019   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   History of diabetes mellitus 01/22/2019   Osteopenia 01/24/2018   Anxiety 12/03/2017   Neutropenia ([place]) 12/31/2015   Seizures ([facility])    Carotid stenosis    PVC's (premature ventricular contractions)    Hemiparesis affecting right side as late effect of cerebrovascular accident (CVA) ([place]) 08/19/2014   CKD (chronic kidney disease), stage IV ([place])  08/19/2014   Morbid obesity ([place]) 03/04/2006   Essential hypertension 03/04/2006   GERD 03/04/2006   History of gastric ulcer 03/04/2006    Conditions to be addressed/monitored: Anxiety and Depression; Mental Health Concerns   Care Plan : General Social Work (Adult)  Updates made by Vern Claude, LCSW since 09/29/2021 12:00 AM     Problem: [facility] AMB "PATIENT-SPECIFIC PROBLEM"   Note:   CARE PLAN ENTRY (see longitudinal plan of care for additional care plan information)  Current Barriers:  Knowledge deficits related to accessing mental health provider in patient with Depression and Trauma History   Patient is experiencing symptoms of  depression which seem to be exacerbated by unresolved trauma.     Patient needs Support, Education, and Care Coordination in order to meet unmet mental health needs  Limited social support, Mental Health Concerns , Social Isolation, and Lacks knowledge of community resource: mental health follow up  Clinical Social Work Goal(s):  Over the next 90 days, patient will work with SW bi-weekly by telephone or in person to reduce or manage symptoms of depression until connected for ongoing counseling resources.   Interventions:  Continued to assess patient's understanding, education, previous treatment and care coordination needs  Provided basic caregiver support, education and interventions  Collaborated with appropriate clinical care team members regarding patient needs Continued to discuss options for long term counseling based on need and insurance, trauma focused care discussed, patient and patient's caregiver agreeable to referral to [facility] which will be completed Confirmed that patient's roommate son has moved out at this time-patient's mood has improved as patient no longer feels threatened by his presence Positive reinforcement provided for efforts made to maintain patient's safety in the home-ongoing mental health counseling to  address trauma history discussed Other interventions include:  Solution-Focused Strategies employed:  Active listening / Reflection utilized   Emotional support continued to be provided  Patient Self Care Activities & Deficits:  Patient is unable to independently navigate community resource options without care coordination support Patient is able to implement clinical interventions discussed today and is motivated for treatment  Patient will select one of the agencies from the list provided and call to schedule an appointment  Performs ADL's independently Motivation for treatment  Please see past updates related to this goal by clicking on the "Past Updates" button in the selected goal        Follow Up Plan: SW will follow up with patient by phone over the next 14-21 business days      Elliot Gurney, Elaine Worker  [facility] Care Management [phone]

## 2021-10-03 ENCOUNTER — Ambulatory Visit: Payer: Medicare Other

## 2021-10-10 ENCOUNTER — Ambulatory Visit (INDEPENDENT_AMBULATORY_CARE_PROVIDER_SITE_OTHER): Payer: Medicare Other

## 2021-10-10 ENCOUNTER — Encounter: Payer: Self-pay | Admitting: Internal Medicine

## 2021-10-10 ENCOUNTER — Ambulatory Visit (INDEPENDENT_AMBULATORY_CARE_PROVIDER_SITE_OTHER): Payer: Medicare Other | Admitting: Internal Medicine

## 2021-10-10 VITALS — BP 124/82 | HR 51 | Resp 16 | Ht 59.0 in | Wt 191.5 lb

## 2021-10-10 DIAGNOSIS — Z8673 Personal history of transient ischemic attack (TIA), and cerebral infarction without residual deficits: Secondary | ICD-10-CM | POA: Diagnosis not present

## 2021-10-10 DIAGNOSIS — Z789 Other specified health status: Secondary | ICD-10-CM

## 2021-10-10 DIAGNOSIS — Z1231 Encounter for screening mammogram for malignant neoplasm of breast: Secondary | ICD-10-CM

## 2021-10-10 DIAGNOSIS — Z0000 Encounter for general adult medical examination without abnormal findings: Secondary | ICD-10-CM

## 2021-10-10 DIAGNOSIS — K219 Gastro-esophageal reflux disease without esophagitis: Secondary | ICD-10-CM

## 2021-10-10 DIAGNOSIS — Z5941 Food insecurity: Secondary | ICD-10-CM

## 2021-10-10 DIAGNOSIS — Z599 Problem related to housing and economic circumstances, unspecified: Secondary | ICD-10-CM

## 2021-10-10 DIAGNOSIS — I10 Essential (primary) hypertension: Secondary | ICD-10-CM

## 2021-10-10 DIAGNOSIS — F331 Major depressive disorder, recurrent, moderate: Secondary | ICD-10-CM

## 2021-10-10 MED ORDER — PANTOPRAZOLE SODIUM 20 MG PO TBEC: 20.0000 mg | DELAYED_RELEASE_TABLET | Freq: Every day | ORAL | 0 refills | Status: DC

## 2021-10-10 MED ORDER — CITALOPRAM HYDROBROMIDE 20 MG PO TABS: 20.0000 mg | ORAL_TABLET | Freq: Every day | ORAL | 1 refills | Status: DC

## 2021-10-10 NOTE — Patient Instructions (Signed)
Ms. Mumme , Thank you for taking time to come for your Medicare Wellness Visit. I appreciate your ongoing commitment to your health goals. Please review the following plan we discussed and let me know if I can assist you in the future.   Screening recommendations/referrals: Colonoscopy: cologuard due Mammogram: done 02/12/18. Please call [phone] to schedule your mammogram and bone density screening Bone Density: done 01/23/18 Recommended yearly ophthalmology/optometry visit for glaucoma screening and checkup Recommended yearly dental visit for hygiene and checkup  Vaccinations: Influenza vaccine: done 01/28/21 Pneumococcal vaccine: done 01/22/19 Tdap vaccine: due Shingles vaccine: Shingrix discussed. Please contact your pharmacy for coverage information.  Covid-19:done 01/26/20  Advanced directives: Advance directive discussed with you today. I have provided a copy for you to complete at home and have notarized. Once this is complete please bring a copy in to our office so we can scan it into your chart.   Conditions/risks identified: Recommend continuing fall prevention at home  Next appointment: Follow up in one year for your annual wellness visit    Preventive Care 65 Years and Older, Female Preventive care refers to lifestyle choices and visits with your health care provider that can promote health and wellness. What does preventive care include? A yearly physical exam. This is also called an annual well check. Dental exams once or twice a year. Routine eye exams. Ask your health care provider how often you should have your eyes checked. Personal lifestyle choices, including: Daily care of your teeth and gums. Regular physical activity. Eating a healthy diet. Avoiding tobacco and drug use. Limiting alcohol use. Practicing safe sex. Taking low-dose aspirin every day. Taking vitamin and mineral supplements as recommended by your health care provider. What happens during an  annual well check? The services and screenings done by your health care provider during your annual well check will depend on your age, overall health, lifestyle risk factors, and family history of disease. Counseling  Your health care provider may ask you questions about your: Alcohol use. Tobacco use. Drug use. Emotional well-being. Home and relationship well-being. Sexual activity. Eating habits. History of falls. Memory and ability to understand (cognition). Work and work Statistician. Reproductive health. Screening  You may have the following tests or measurements: Height, weight, and BMI. Blood pressure. Lipid and cholesterol levels. These may be checked every 5 years, or more frequently if you are over 71 years old. Skin check. Lung cancer screening. You may have this screening every year starting at age 75 if you have a 30-pack-year history of smoking and currently smoke or have quit within the past 15 years. Fecal occult blood test (FOBT) of the stool. You may have this test every year starting at age 25. Flexible sigmoidoscopy or colonoscopy. You may have a sigmoidoscopy every 5 years or a colonoscopy every 10 years starting at age 47. Hepatitis C blood test. Hepatitis B blood test. Sexually transmitted disease (STD) testing. Diabetes screening. This is done by checking your blood sugar (glucose) after you have not eaten for a while (fasting). You may have this done every 1-3 years. Bone density scan. This is done to screen for osteoporosis. You may have this done starting at age 47. Mammogram. This may be done every 1-2 years. Talk to your health care provider about how often you should have regular mammograms. Talk with your health care provider about your test results, treatment options, and if necessary, the need for more tests. Vaccines  Your health care provider may recommend certain vaccines, such  as: Influenza vaccine. This is recommended every year. Tetanus,  diphtheria, and acellular pertussis (Tdap, Td) vaccine. You may need a Td booster every 10 years. Zoster vaccine. You may need this after age 67. Pneumococcal 13-valent conjugate (PCV13) vaccine. One dose is recommended after age 73. Pneumococcal polysaccharide (PPSV23) vaccine. One dose is recommended after age 53. Talk to your health care provider about which screenings and vaccines you need and how often you need them. This information is not intended to replace advice given to you by your health care provider. Make sure you discuss any questions you have with your health care provider. Document Released: 05/13/2015 Document Revised: 01/04/2016 Document Reviewed: 02/15/2015 Elsevier Interactive Patient Education  2017 [place] Prevention in the Home Falls can cause injuries. They can happen to people of all ages. There are many things you can do to make your home safe and to help prevent falls. What can I do on the outside of my home? Regularly fix the edges of walkways and driveways and fix any cracks. Remove anything that might make you trip as you walk through a door, such as a raised step or threshold. Trim any bushes or trees on the path to your home. Use bright outdoor lighting. Clear any walking paths of anything that might make someone trip, such as rocks or tools. Regularly check to see if handrails are loose or broken. Make sure that both sides of any steps have handrails. Any raised decks and porches should have guardrails on the edges. Have any leaves, snow, or ice cleared regularly. Use sand or salt on walking paths during winter. Clean up any spills in your garage right away. This includes oil or grease spills. What can I do in the bathroom? Use night lights. Install grab bars by the toilet and in the tub and shower. Do not use towel bars as grab bars. Use non-skid mats or decals in the tub or shower. If you need to sit down in the shower, use a plastic,  non-slip stool. Keep the floor dry. Clean up any water that spills on the floor as soon as it happens. Remove soap buildup in the tub or shower regularly. Attach bath mats securely with double-sided non-slip rug tape. Do not have throw rugs and other things on the floor that can make you trip. What can I do in the bedroom? Use night lights. Make sure that you have a light by your bed that is easy to reach. Do not use any sheets or blankets that are too big for your bed. They should not hang down onto the floor. Have a firm chair that has side arms. You can use this for support while you get dressed. Do not have throw rugs and other things on the floor that can make you trip. What can I do in the kitchen? Clean up any spills right away. Avoid walking on wet floors. Keep items that you use a lot in easy-to-reach places. If you need to reach something above you, use a strong step stool that has a grab bar. Keep electrical cords out of the way. Do not use floor polish or wax that makes floors slippery. If you must use wax, use non-skid floor wax. Do not have throw rugs and other things on the floor that can make you trip. What can I do with my stairs? Do not leave any items on the stairs. Make sure that there are handrails on both sides of the stairs and use  them. Fix handrails that are broken or loose. Make sure that handrails are as long as the stairways. Check any carpeting to make sure that it is firmly attached to the stairs. Fix any carpet that is loose or worn. Avoid having throw rugs at the top or bottom of the stairs. If you do have throw rugs, attach them to the floor with carpet tape. Make sure that you have a light switch at the top of the stairs and the bottom of the stairs. If you do not have them, ask someone to add them for you. What else can I do to help prevent falls? Wear shoes that: Do not have high heels. Have rubber bottoms. Are comfortable and fit you well. Are closed  at the toe. Do not wear sandals. If you use a stepladder: Make sure that it is fully opened. Do not climb a closed stepladder. Make sure that both sides of the stepladder are locked into place. Ask someone to hold it for you, if possible. Clearly mark and make sure that you can see: Any grab bars or handrails. First and last steps. Where the edge of each step is. Use tools that help you move around (mobility aids) if they are needed. These include: Canes. Walkers. Scooters. Crutches. Turn on the lights when you go into a dark area. Replace any light bulbs as soon as they burn out. Set up your furniture so you have a clear path. Avoid moving your furniture around. If any of your floors are uneven, fix them. If there are any pets around you, be aware of where they are. Review your medicines with your doctor. Some medicines can make you feel dizzy. This can increase your chance of falling. Ask your doctor what other things that you can do to help prevent falls. This information is not intended to replace advice given to you by your health care provider. Make sure you discuss any questions you have with your health care provider. Document Released: 02/10/2009 Document Revised: 09/22/2015 Document Reviewed: 05/21/2014 Elsevier Interactive Patient Education  2017 [employer].

## 2021-10-10 NOTE — Patient Instructions (Addendum)
It was great seeing you today!  Plan discussed at today's visit: -Celexa increased to 20 mg daily -Protonix decreased to 20 mg, continue to take daily  -Take iron supplements - ferrous sulfate 65 mg ([employer] is good). If having GI symptoms, take every other day.  -Prepare for labs, please be fasting at least 8-12 hours   Follow up in: 6 weeks   Take care and let us know if you have any questions or concerns prior to your next visit.  Dr. Rosana Berger

## 2021-10-10 NOTE — Assessment & Plan Note (Signed)
Chronic and stable.  No changes to medications made today, continue hydralazine 10 mg twice daily, torsemide 20 mg daily.

## 2021-10-10 NOTE — Progress Notes (Addendum)
Subjective:   Leslie Houston is a 71 y.o. female who presents for Medicare Annual (Subsequent) preventive examination.  Review of Systems     Cardiac Risk Factors include: advanced age (>35mn, >>80women);diabetes mellitus;hypertension;dyslipidemia;obesity (BMI >30kg/m2)     Objective:    There were no vitals filed for this visit. There is no height or weight on file to calculate BMI.     10/10/2021    2:21 PM 09/29/2020    3:08 PM 03/15/2020   11:13 AM 07/02/2019    3:00 PM 07/02/2019   10:57 AM 01/17/2019   10:48 AM 01/17/2019   10:41 AM  Advanced Directives  Does Patient Have a Medical Advance Directive? No No Yes No No No No  Type of AComptrollerLiving will      Does patient want to make changes to medical advance directive?   No - Patient declined      Copy of [place] Chart?   No - copy requested      Would patient like information on creating a medical advance directive? Yes (MAU/Ambulatory/Procedural Areas - Information given) No - Patient declined  No - Patient declined No - Patient declined No - Patient declined No - Patient declined    Current Medications (verified) Outpatient Encounter Medications as of 10/10/2021  Medication Sig   ASPIRIN LOW DOSE 81 MG EC tablet TAKE 1 TABLET BY MOUTH ONCE DAILY *SWALLOW WHOLE*   Cholecalciferol (VITAMIN D3) 25 MCG (1000 UT) CAPS Take 1 capsule by mouth daily.   citalopram (CELEXA) 20 MG tablet Take 1 tablet (20 mg total) by mouth daily.   ezetimibe (ZETIA) 10 MG tablet TAKE 1 TABLET BY MOUTH ONCE A DAY   ferrous sulfate 325 (65 FE) MG tablet Take 325 mg by mouth daily with breakfast.   fluticasone (FLONASE) 50 MCG/ACT nasal spray Place 2 sprays into both nostrils daily as needed for allergies (allergies).    hydrALAZINE (APRESOLINE) 10 MG tablet TAKE 1 TABLET BY MOUTH TWICE A DAY   levETIRAcetam (KEPPRA) 500 MG tablet TAKE 1 TABLET BY MOUTH IN THE MORNING AND 2 TABLETS BY  MOUTH AT NIGHT.   loratadine (CLARITIN) 10 MG tablet TAKE 1 TABLET BY MOUTH ONCE A DAY AS NEEDED FOR ALLERGIES   pantoprazole (PROTONIX) 20 MG tablet Take 1 tablet (20 mg total) by mouth daily.   tamsulosin (FLOMAX) 0.4 MG CAPS capsule Take 1 capsule (0.4 mg total) by mouth daily.   torsemide (DEMADEX) 20 MG tablet Take 1 tablet (20 mg total) by mouth daily.   No facility-administered encounter medications on file as of 10/10/2021.    Allergies (verified) Latex, Penicillin g, Atorvastatin, Other, Amlodipine, and Doxycycline   History: Past Medical History:  Diagnosis Date   (HFpEF) heart failure with preserved ejection fraction ([place]    a. Pt reports in 2000 she had CHF, details unclear, outside hospital; b. 06/2019 Echo: EF 50-55%, no rwma, Gr2 DD,  mildly dil LA, mild MR.   CAD (coronary artery disease)    a. Pt reports in 2000 she had a stent to a coronary artery, details unclear, outside hospital.   Carotid stenosis    a. CT angio head 07/2014 - 50-60% stenoses of prox ICA bilaterally.   CKD (chronic kidney disease), stage III ([place]    Diabetes mellitus ([place]    Denies.   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 L PCA CVA in 05/2014.   Syncope    Weakness    left side S/P stroke   Past Surgical History:  Procedure Laterality Date   BLADDER SURGERY     CARDIAC CATHETERIZATION     CATARACT EXTRACTION W/PHACO Left 03/21/2021   Procedure: CATARACT EXTRACTION PHACO AND INTRAOCULAR LENS PLACEMENT ([place]) left 5.86 00:38.0;  Surgeon:  Birder Robson, MD;  Location: [facility];  Service: Ophthalmology;  Laterality: Left;  Latex   CESAREAN SECTION     DENTAL SURGERY     Heart stent     stint     Family History  Problem Relation Age of Onset   Stroke Mother    Heart attack Mother    Lung cancer Mother    Stroke Father    Prostate cancer Father    Colon cancer Father    Diabetes Mellitus II Daughter    Multiple sclerosis Daughter    Kidney disease Son    Heart failure Son    Cancer Paternal Grandfather    Social History   Socioeconomic History   Marital status: Single    Spouse name: Not on file   Number of children: 2   Years of education: 12   Highest education level: Not on file  Occupational History   Occupation: Disabled  Tobacco Use   Smoking status: Former    Types: Cigarettes    Quit date: 03/30/2014    Years since quitting: 7.5   Smokeless tobacco: Never  Vaping Use   Vaping Use: Never used  Substance and Sexual Activity   Alcohol use: No   Drug use: No   Sexual activity: Not Currently  Other Topics Concern   Not on file  Social History Narrative   Lives at home with her son's father.   Right-handed.   1 cup coffee per day.   Social Determinants of Health   Financial Resource Strain: Medium Risk (10/10/2021)   Overall Financial Resource Strain (CARDIA)    Difficulty of Paying Living Expenses: Somewhat hard  Food Insecurity: Food Insecurity Present (10/10/2021)   Hunger Vital Sign    Worried About Running Out of Food in the Last Year: Sometimes true    Ran Out of Food in the Last Year: Never true  Transportation Needs: No Transportation Needs (10/10/2021)   PRAPARE - Hydrologist (Medical): No    Lack of Transportation (Non-Medical): No  Physical Activity: Insufficiently Active (10/10/2021)   Exercise Vital Sign    Days of Exercise per Week: 3 days    Minutes of Exercise per Session: 20 min  Stress: No Stress Concern Present (10/10/2021)    [place]    Feeling of Stress : Only a little  Social Connections: Moderately Isolated (10/10/2021)   Social Connection and Isolation Panel [NHANES]    Frequency of Communication with Friends and Family: More than  three times a week    Frequency of Social Gatherings with Friends and Family: Three times a week    Attends Religious Services: More than 4 times per year    Active Member of Clubs or Organizations: No    Attends Archivist Meetings: Never    Marital Status: Divorced    Tobacco Counseling Counseling given: Not Answered   Clinical Intake:  Pre-visit preparation completed: Yes  Pain : No/denies pain     BMI - recorded: 38.68 Nutritional Status: BMI > 30  Obese Nutritional Risks: None Diabetes: No  How often do you need to have someone help you when you read instructions, pamphlets, or other written materials from your doctor or pharmacy?: 1 - Never    Interpreter Needed?: No  Information entered by :: Clemetine Marker LPN   Activities of Daily Living    10/10/2021    2:23 PM 10/10/2021    1:40 PM  In your present state of health, do you have any difficulty performing the following activities:  Hearing? 1 1  Vision? 1 1  Difficulty concentrating or making decisions? 1 1  Walking or climbing stairs? 1 1  Dressing or bathing? 0 0  Doing errands, shopping? 0 0  Preparing Food and eating ? N   Using the Toilet? N   In the past six months, have you accidently leaked urine? Y   Comment wears pads for protection   Do you have problems with loss of bowel control? N   Managing your Medications? N   Managing your Finances? N   Housekeeping or managing your Housekeeping? N     Patient Care Team: Teodora Medici, DO as PCP - General (Internal Medicine) Minna Merritts, MD as PCP - Cardiology (Cardiology) [name], MD as Consulting Physician (Urology) Murlean Iba, MD  (Nephrology) Vern Claude, [place] as Social Worker  Indicate any recent Medical Services you may have received from other than [facility] providers in the past year (date may be approximate).     Assessment:   This is a routine wellness examination for Cristal.  Hearing/Vision screen Hearing Screening - Comments:: Pt has hearing aid in left ear Vision Screening - Comments:: [facility] vision screenings [facility]  Dietary issues and exercise activities discussed: Current Exercise Habits: Home exercise routine, Type of exercise: walking, Time (Minutes): 20, Frequency (Times/Week): 3, Weekly Exercise (Minutes/Week): 60, Intensity: Mild, Exercise limited by: None identified   Goals Addressed             This Visit's Progress    DIET - INCREASE WATER INTAKE   On track    Recommend drinking 6-8 glasses of water per day       Depression Screen    10/10/2021    2:18 PM 10/10/2021    1:48 PM 10/10/2021    1:40 PM 08/24/2021    3:19 PM 07/07/2021    2:47 PM 06/28/2021   10:54 AM 12/02/2020    1:42 PM  PHQ 2/9 Scores  PHQ - 2 Score '2 2 2 5 4 5 3  '$ PHQ- 9 Score '9 9  22  12 13    '$ Fall Risk    10/10/2021    2:23 PM 10/10/2021    1:36 PM 08/24/2021    3:14 PM 06/28/2021   10:51 AM 12/02/2020    1:36 PM  Fall Risk   Falls in the past year? 0 0 1 0 0  Number falls in past yr: 0 0  1 0 0  Injury with Fall? 0 0 1 0 0  Risk for fall due to : No Fall Risks   Impaired balance/gait;Impaired mobility   Follow up Falls prevention discussed        FALL RISK PREVENTION PERTAINING TO THE HOME:  Any stairs in or around the home? No  If so, are there any without handrails? No  Home free of loose throw rugs in walkways, pet beds, electrical cords, etc? Yes  Adequate lighting in your home to reduce risk of falls? Yes   ASSISTIVE DEVICES UTILIZED TO PREVENT FALLS:  Life alert? No  Use of a cane, walker or w/c? No  Grab bars in the bathroom? Yes  Shower chair or bench in shower? Yes   Elevated toilet seat or a handicapped toilet? Yes   TIMED UP AND GO:  Was the test performed? Yes .  Length of time to ambulate 10 feet: 6 sec.   Gait steady and fast without use of assistive device  Cognitive Function: Cognitive status assessed by direct observation. Patient has current diagnosis of cognitive impairment.          Immunizations Immunization History  Administered Date(s) Administered   Fluad Quad(high Dose 65+) 01/22/2019, 03/04/2020   Influenza-Unspecified 01/28/2021   Moderna Sars-Covid-2 Vaccination 01/26/2020   Pneumococcal Conjugate-13 04/18/2016   Pneumococcal Polysaccharide-23 01/22/2019    TDAP status: Due, Education has been provided regarding the importance of this vaccine. Advised may receive this vaccine at local pharmacy or Health Dept. Aware to provide a copy of the vaccination record if obtained from local pharmacy or Health Dept. Verbalized acceptance and understanding.  Flu Vaccine status: Up to date  Pneumococcal vaccine status: Up to date  Covid-19 vaccine status: Information provided on how to obtain vaccines.   Qualifies for Shingles Vaccine? Yes   Zostavax completed No   Shingrix Completed?: No.    Education has been provided regarding the importance of this vaccine. Patient has been advised to call insurance company to determine out of pocket expense if they have not yet received this vaccine. Advised may also receive vaccine at local pharmacy or Health Dept. Verbalized acceptance and understanding.  Screening Tests Health Maintenance  Topic Date Due   Zoster Vaccines- Shingrix (1 of 2) Never done   MAMMOGRAM  02/13/2019   COVID-19 Vaccine (2 - Moderna series) 03/22/2020   Fecal DNA (Cologuard)  06/29/2022 (Originally 04/24/1996)   INFLUENZA VACCINE  11/28/2021   Pneumonia Vaccine 40+ Years old  Completed   DEXA SCAN  Completed   Hepatitis C Screening  Completed   HPV VACCINES  Aged Out   TETANUS/TDAP  Discontinued    Health  Maintenance  Health Maintenance Due  Topic Date Due   Zoster Vaccines- Shingrix (1 of 2) Never done   MAMMOGRAM  02/13/2019   COVID-19 Vaccine (2 - Moderna series) 03/22/2020    Colorectal cancer screening: cologuard previously ordered but not completed. Pt to discuss with Dr. Rosana Berger.   Mammogram status: Completed 02/12/18. Repeat every year. Ordered today  Bone Density status: Completed 01/23/18. Results reflect: Bone density results: OSTEOPENIA. Repeat every 2 years. Ordered 12/02/20  Lung Cancer Screening: (Low Dose CT Chest recommended if Age 63-80 years, 30 pack-year currently smoking OR have quit w/in 15years.) does not qualify.  Additional Screening:  Hepatitis C Screening: does qualify; Completed 12/30/15  Vision Screening: Recommended annual ophthalmology exams for early detection of glaucoma and other disorders of the eye. Is the patient up to date  with their annual eye exam?  Yes  Who is the provider or what is the name of the office in which the patient attends annual eye exams? [facility].   Dental Screening: Recommended annual dental exams for proper oral hygiene  Community Resource Referral / Chronic Care Management: CRR required this visit?  Yes   CCM required this visit?  No      Plan:     I have personally reviewed and noted the following in the patient's chart:   Medical and social history Use of alcohol, tobacco or illicit drugs  Current medications and supplements including opioid prescriptions.  Functional ability and status Nutritional status Physical activity Advanced directives List of other physicians Hospitalizations, surgeries, and ER visits in previous 12 months Vitals Screenings to include cognitive, depression, and falls Referrals and appointments  In addition, I have reviewed and discussed with patient certain preventive protocols, quality metrics, and best practice recommendations. A written personalized care plan for  preventive services as well as general preventive health recommendations were provided to patient.     Clemetine Marker, LPN   [id number]   Nurse Notes: pt accompanied to visit today by caregiver [name]

## 2021-10-10 NOTE — Assessment & Plan Note (Signed)
Overall moods have been much improved since she has been taking Celexa 10 mg daily.  She would like to increase the medication, we will increase it to 20 mg daily.  Stressful situation at home has now been resolved.  Follow-up in 6 weeks for medication recheck.

## 2021-10-10 NOTE — Progress Notes (Signed)
Established Patient Office Visit  Subjective:  Patient ID: Leslie Houston, female    DOB: 17-Dec-1950  Age: 71 y.o. MRN: [id number]  CC:  Chief Complaint  Patient presents with   Follow-up   Depression   Hypertension   Gastroesophageal Reflux    HPI Soliyana Mcchristian presents for follow up on chronic medical conditions. Accompanied by her friend [name].   Hypertension/CHF: -Medications: Amlodipine 5 discontinued, Hydralazine 10 twice daily, Torsemide 20 -Patient is compliant with above medications and reports no side effects. -Checking BP at home (average): not checking recently  -Denies any SOB, CP, vision changes, LE edema or symptoms of hypotension -Following with Cardiology, Dr. Rockey Situ, last saw 02/14/21, note reviewed   Hx of CVA/CAD: -Currently on Zetia and aspirin, statin intolerance history  Lipid Panel     Component Value Date/Time   CHOL 159 12/02/2020 1414   TRIG 67 12/02/2020 1414   HDL 78 12/02/2020 1414   CHOLHDL 2.0 12/02/2020 1414   VLDL 9 11/19/2016 1141   LDLCALC 67 12/02/2020 1414   CKD4: -Last creatinine 8/22 2.95, GFR 16 -Following with Nephrology, last seen 08/29/20    Seizure Disorder: -Currently on Keppra 500 in the a.m., 1000 mg at night doing well, no seizure like activity  -Following with Neurology   GERD: -Complaining of acid reflux and regurgitation, especially after taking her large pills at night -Avoiding trigger foods and laying on 3 pillows at night but still having symptoms -Denies nausea, vomiting or abdominal pain.  Weight stable appetite good -Started on Protonix 40 mg, doing much better since starting medication.   MDD: -Mood status: better -Current treatment: Celexa 10 mg (increased at LOV) but was confused about how to take the medication, had been taking it only as needed instead of daily. Now has been taking the Celexa 10 mg daily and doing much better in terms of moods but is interested in increasing the  medication  -Satisfied with current treatment?: yes, would like to increase -Symptom severity: severe  -Duration of current treatment : years -Side effects: no Medication compliance: excellent compliance -Had a lot of trauma in past and recent deaths in family, had also been dealing with a stressful home situation that has been resolved now.      10/10/2021    2:18 PM 10/10/2021    1:48 PM 10/10/2021    1:40 PM 08/24/2021    3:19 PM 07/07/2021    2:47 PM  Depression screen PHQ 2/9  Decreased Interest _0 Down, Depressed, Hopeless _1 PHQ - 2 Score _2 Altered sleeping _3 Tired, decreased energy _4 Change in appetite _5 Feeling bad or failure about yourself  0 0  3   Trouble concentrating _6 Moving slowly or fidgety/restless _7 Suicidal thoughts 0 0  1   PHQ-9 Score _8 Difficult doing work/chores Not difficult at all Not difficult at all  Somewhat difficult    Health Maintenance: -Blood work up to date, plan to repeat at follow up   Past Medical History:  Diagnosis Date   (HFpEF) heart failure with preserved ejection fraction ([place])    a. Pt reports in 2000 she had CHF, details unclear, outside hospital; b. 06/2019 Echo: EF 50-55%, no rwma, Gr2 DD,  mildly  dil LA, mild MR.   CAD (coronary artery disease)    a. Pt reports in 2000 she had a stent to a coronary artery, details unclear, outside hospital.   Carotid stenosis    a. CT angio head 07/2014 - 50-60% stenoses of prox ICA bilaterally.   CKD (chronic kidney disease), stage III ([place])    Diabetes mellitus ([place])    Denies.   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 L PCA CVA in 05/2014.   Syncope    Weakness    left side S/P stroke    Past Surgical History:  Procedure Laterality Date   BLADDER SURGERY     CARDIAC CATHETERIZATION     CATARACT EXTRACTION W/PHACO Left 03/21/2021   Procedure: CATARACT EXTRACTION PHACO AND INTRAOCULAR LENS PLACEMENT ([place]) left 5.86 00:38.0;  Surgeon: Birder Robson, MD;  Location: [facility];  Service: Ophthalmology;  Laterality: Left;  Latex   CESAREAN SECTION     DENTAL SURGERY     Heart stent     stint      Family History  Problem Relation Age of Onset   Stroke Mother    Heart attack Mother    Lung cancer Mother    Stroke Father    Prostate cancer Father    Colon cancer Father    Diabetes Mellitus II Daughter    Multiple sclerosis Daughter    Kidney disease Son    Heart failure Son    Cancer Paternal Grandfather     Social History   Socioeconomic History   Marital status: Single    Spouse name: Not on file   Number of children: 2   Years of education: 12   Highest education level: Not on file  Occupational History   Occupation: Disabled  Tobacco Use   Smoking status: Former    Types: Cigarettes    Quit date: 03/30/2014    Years since quitting: 7.5   Smokeless tobacco: Never  Vaping Use   Vaping Use: Never used  Substance and Sexual Activity   Alcohol use: No   Drug use: No   Sexual activity: Not Currently  Other Topics Concern   Not on file  Social History Narrative   Lives at home with her son's father.   Right-handed.   1 cup coffee per day.   Social Determinants of Health   Financial Resource Strain: Medium Risk (10/10/2021)   Overall Financial Resource Strain (CARDIA)    Difficulty of Paying Living Expenses: Somewhat hard  Food Insecurity: Food Insecurity Present (10/10/2021)   Hunger Vital Sign    Worried  About Running Out of Food in the Last Year: Sometimes true    Ran Out of Food in the Last Year: Never true  Transportation Needs: No Transportation Needs (10/10/2021)   PRAPARE - Hydrologist (Medical): No  Lack of Transportation (Non-Medical): No  Physical Activity: Insufficiently Active (10/10/2021)   Exercise Vital Sign    Days of Exercise per Week: 3 days    Minutes of Exercise per Session: 20 min  Stress: No Stress Concern Present (10/10/2021)   [place]    Feeling of Stress : Only a little  Social Connections: Moderately Isolated (10/10/2021)   Social Connection and Isolation Panel [NHANES]    Frequency of Communication with Friends and Family: More than three times a week    Frequency of Social Gatherings with Friends and Family: Three times a week    Attends Religious Services: More than 4 times per year    Active Member of Clubs or Organizations: No    Attends Archivist Meetings: Never    Marital Status: Divorced  Human resources officer Violence: Not At Risk (10/10/2021)   Humiliation, Afraid, Rape, and Kick questionnaire    Fear of Current or Ex-Partner: No    Emotionally Abused: No    Physically Abused: No    Sexually Abused: No    Outpatient Medications Prior to Visit  Medication Sig Dispense Refill   ASPIRIN LOW DOSE 81 MG EC tablet TAKE 1 TABLET BY MOUTH ONCE DAILY *SWALLOW WHOLE* 30 tablet 11   Cholecalciferol (VITAMIN D3) 25 MCG (1000 UT) CAPS Take 1 capsule by mouth daily.     ezetimibe (ZETIA) 10 MG tablet TAKE 1 TABLET BY MOUTH ONCE A DAY 90 tablet 3   ferrous sulfate 325 (65 FE) MG tablet Take 325 mg by mouth daily with breakfast.     fluticasone (FLONASE) 50 MCG/ACT nasal spray Place 2 sprays into both nostrils daily as needed for allergies (allergies).      hydrALAZINE (APRESOLINE) 10 MG tablet TAKE 1 TABLET BY MOUTH TWICE A DAY 180 tablet 3   levETIRAcetam  (KEPPRA) 500 MG tablet TAKE 1 TABLET BY MOUTH IN THE MORNING AND 2 TABLETS BY MOUTH AT NIGHT. 270 tablet 3   loratadine (CLARITIN) 10 MG tablet TAKE 1 TABLET BY MOUTH ONCE A DAY AS NEEDED FOR ALLERGIES 30 tablet 3   tamsulosin (FLOMAX) 0.4 MG CAPS capsule Take 1 capsule (0.4 mg total) by mouth daily. 90 capsule 0   torsemide (DEMADEX) 20 MG tablet Take 1 tablet (20 mg total) by mouth daily. 90 tablet 1   citalopram (CELEXA) 10 MG tablet Take 1 tablet (10 mg total) by mouth daily. 90 tablet 0   pantoprazole (PROTONIX) 40 MG tablet Take 1 tablet (40 mg total) by mouth daily. 30 tablet 1   No facility-administered medications prior to visit.    Allergies  Allergen Reactions   Latex Rash   Penicillin G Itching   Atorvastatin Other (See Comments)    Myalgias    Other Hives    Adhesive tape   Amlodipine Swelling    Leg swelling   Doxycycline     Blood in urine    ROS Review of Systems  Constitutional:  Negative for chills and fever.  Eyes:  Negative for visual disturbance.  Respiratory:  Negative for cough and shortness of breath.   Cardiovascular:  Negative for chest pain and palpitations.  Neurological:  Negative for seizures.  Psychiatric/Behavioral:  The patient is nervous/anxious.       Objective:    Physical Exam Constitutional:      Appearance: Normal appearance.  HENT:     Head: Normocephalic and atraumatic.  Eyes:     Conjunctiva/sclera:  Conjunctivae normal.  Cardiovascular:     Rate and Rhythm: Normal rate and regular rhythm.  Pulmonary:     Effort: Pulmonary effort is normal.     Breath sounds: Normal breath sounds.  Musculoskeletal:     Right lower leg: No edema.     Left lower leg: No edema.  Skin:    General: Skin is warm and dry.  Neurological:     General: No focal deficit present.     Mental Status: She is alert. Mental status is at baseline.  Psychiatric:        Mood and Affect: Mood normal.        Behavior: Behavior normal.     BP 124/82    Pulse (!) 51   Resp 16   Ht _0  (1.499 m)   Wt 191 lb 8 oz (86.9 kg)   SpO2 97%   BMI 38.68 kg/m  Wt Readings from Last 3 Encounters:  10/10/21 191 lb 8 oz (86.9 kg)  08/24/21 193 lb 4.8 oz (87.7 kg)  06/28/21 195 lb 12.8 oz (88.8 kg)     Health Maintenance Due  Topic Date Due   Zoster Vaccines- Shingrix (1 of 2) Never done   MAMMOGRAM  02/13/2019   COVID-19 Vaccine (2 - Moderna series) 03/22/2020    There are no preventive care reminders to display for this patient.  Lab Results  Component Value Date   TSH 2.892 07/02/2019   Lab Results  Component Value Date   WBC 5.5 12/02/2020   HGB 11.5 (L) 12/02/2020   HCT 34.6 (L) 12/02/2020   MCV 96.9 12/02/2020   PLT 222 12/02/2020   Lab Results  Component Value Date   NA 142 12/02/2020   K 4.5 12/02/2020   CO2 23 12/02/2020   GLUCOSE 83 12/02/2020   BUN 46 (H) 12/02/2020   CREATININE 2.95 (H) 12/02/2020   BILITOT 0.4 12/02/2020   ALKPHOS 95 05/03/2020   AST 10 12/02/2020   ALT 7 12/02/2020   PROT 7.1 12/02/2020   ALBUMIN 3.8 05/03/2020   CALCIUM 9.3 12/02/2020   ANIONGAP 8 11/18/2020   EGFR 16 (L) 12/02/2020   Lab Results  Component Value Date   CHOL 159 12/02/2020   Lab Results  Component Value Date   HDL 78 12/02/2020   Lab Results  Component Value Date   LDLCALC 67 12/02/2020   Lab Results  Component Value Date   TRIG 67 12/02/2020   Lab Results  Component Value Date   CHOLHDL 2.0 12/02/2020   Lab Results  Component Value Date   HGBA1C 4.8 01/07/2019      Assessment & Plan:   Problem List Items Addressed This Visit       Cardiovascular and Mediastinum   Essential hypertension    Chronic and stable.  No changes to medications made today, continue hydralazine 10 mg twice daily, torsemide 20 mg daily.        Digestive   GERD    Symptoms well controlled with Protonix 40 mg daily, will refill 20 mg daily.  Continue to work on lifestyle modification. Recheck at follow up.        Relevant Medications   pantoprazole (PROTONIX) 20 MG tablet     Other   Moderate episode of recurrent major depressive disorder ([place]) - Primary    Overall moods have been much improved since she has been taking Celexa 10 mg daily.  She would like to increase the medication, we will increase  it to 20 mg daily.  Stressful situation at home has now been resolved.  Follow-up in 6 weeks for medication recheck.      Relevant Medications   citalopram (CELEXA) 20 MG tablet   History of CVA (cerebrovascular accident)    Overall stable.  Continue Zetia 10 mg and aspirin daily, secondary to statin intolerance history.  Blood pressure well controlled.  Plan to recheck cholesterol fasting at next visit.      Other Visit Diagnoses     Encounter for screening mammogram for malignant neoplasm of breast       Relevant Orders   MM Digital Screening      Meds ordered this encounter  Medications   citalopram (CELEXA) 20 MG tablet    Sig: Take 1 tablet (20 mg total) by mouth daily.    Dispense:  90 tablet    Refill:  1   pantoprazole (PROTONIX) 20 MG tablet    Sig: Take 1 tablet (20 mg total) by mouth daily.    Dispense:  90 tablet    Refill:  0    Follow-up: Return in about 6 weeks (around 11/21/2021).    Teodora Medici, DO

## 2021-10-10 NOTE — Assessment & Plan Note (Signed)
Symptoms well controlled with Protonix 40 mg daily, will refill 20 mg daily.  Continue to work on lifestyle modification. Recheck at follow up.

## 2021-10-10 NOTE — Assessment & Plan Note (Signed)
Overall stable.  Continue Zetia 10 mg and aspirin daily, secondary to statin intolerance history.  Blood pressure well controlled.  Plan to recheck cholesterol fasting at next visit.

## 2021-10-16 ENCOUNTER — Telehealth: Payer: Medicare Other

## 2021-10-16 ENCOUNTER — Telehealth: Payer: Self-pay | Admitting: *Deleted

## 2021-10-16 NOTE — Telephone Encounter (Signed)
  Care Management   Follow Up Note   10/16/2021 Name: Leslie Houston MRN: [id number] DOB: [birth year]   Referred by: Teodora Medici, DO Reason for referral : Care Coordination   An unsuccessful telephone outreach was attempted today. The patient was referred to the case management team for assistance with care management and care coordination.   Follow Up Plan: Telephone follow up appointment with care management team member to be re-scheduled by careguide   Elliot Gurney, [place] Worker  [facility] Care Management [phone]

## 2021-10-24 ENCOUNTER — Telehealth: Payer: Self-pay | Admitting: *Deleted

## 2021-10-24 NOTE — Telephone Encounter (Signed)
   Telephone encounter was:  Successful.  10/24/2021 Name: Leslie Houston MRN: [id number] DOB: 09-27-1950  Leslie Houston is a 71 y.o. year old female who is a primary care patient of [name], DO . The community resource team was consulted for assistance with Food Insecurity  Care guide performed the following interventions: Patient provided with information about care guide support team and interviewed to confirm resource needs.  Follow Up Plan:  Client will return call no other numbers working even for voicemails  Alois Cliche -[facility] Guide , Embedded Care Coordination [facility], Care Management  [phone] [address] [place] , [place] [address] Email : [email or web address]

## 2021-10-25 ENCOUNTER — Telehealth: Payer: Self-pay | Admitting: *Deleted

## 2021-10-25 NOTE — Telephone Encounter (Signed)
   Telephone encounter was:  Successful.  10/25/2021 Name: Leslie Houston MRN: [id number] DOB: 07-Feb-1951  Leslie Houston is a 71 y.o. year old female who is a primary care patient of Teodora Medici, DO . The community resource team was consulted for assistance with Transportation Needs  and [place] guide performed the following interventions: Patient provided with information about care guide support team and interviewed to confirm resource needs Discussed resources to assist with utilities and food  Obtained verbal consent to place patient referral to mow Follow up call placed to community resources to determine status of patients referral. Placed call with patient to dss to establish her desire to have a dual eligible plan and a case manager will be calling her, also provided information on her getting the [place] and food bank lists where mailed   Follow Up Plan:  Care guide will outreach resources to assist patient with [place] , [place], Care Management  [phone] [address] , [place] [address] Email : Ashby [email or web address]

## 2021-10-26 ENCOUNTER — Other Ambulatory Visit: Payer: Self-pay | Admitting: Family

## 2021-10-26 ENCOUNTER — Other Ambulatory Visit: Payer: Self-pay | Admitting: Physician Assistant

## 2021-10-26 DIAGNOSIS — I5030 Unspecified diastolic (congestive) heart failure: Secondary | ICD-10-CM

## 2021-10-26 DIAGNOSIS — N319 Neuromuscular dysfunction of bladder, unspecified: Secondary | ICD-10-CM

## 2021-10-26 NOTE — Telephone Encounter (Signed)
Pt of Dr. Gollan. Please review for refill. Thank you! 

## 2021-10-27 DIAGNOSIS — F331 Major depressive disorder, recurrent, moderate: Secondary | ICD-10-CM

## 2021-10-27 DIAGNOSIS — I10 Essential (primary) hypertension: Secondary | ICD-10-CM

## 2021-10-30 ENCOUNTER — Other Ambulatory Visit: Payer: Self-pay | Admitting: Physician Assistant

## 2021-10-30 DIAGNOSIS — N319 Neuromuscular dysfunction of bladder, unspecified: Secondary | ICD-10-CM

## 2021-11-02 ENCOUNTER — Telehealth: Payer: Self-pay | Admitting: *Deleted

## 2021-11-02 NOTE — Telephone Encounter (Signed)
  Care Management   Follow Up Note   11/02/2021 Name: Leslie Houston MRN: [id number] DOB: 25-Jun-1950   Referred by: Teodora Medici, DO Reason for referral : Care Coordination   A second unsuccessful telephone outreach was attempted today. The patient was referred to the case management team for assistance with care management and care coordination.   Follow Up Plan: Telephone follow up appointment with care management team member scheduled for: 11/03/21  Elliot Gurney, [place] Worker  [facility] Care Management [phone]

## 2021-11-02 NOTE — Telephone Encounter (Signed)
   Telephone encounter was:  Unsuccessful.  11/02/2021 Name: Leslie Houston MRN: [id number] DOB: 01-04-1951  Unsuccessful outbound call made today to assist with:  [place] Attempt:Reached out again to meals on wheels in [place] left message   A HIPAA compliant voice message was left requesting a return call.  Instructed patient to call back at   Instructed patient to call back at [phone]  at their earliest convenience. .  [place], Care Management  [phone] [address] , [place] [address] Email : Ashby [email or web address]. [email or web address] [email or web address]

## 2021-11-06 ENCOUNTER — Telehealth: Payer: Self-pay | Admitting: *Deleted

## 2021-11-06 NOTE — Telephone Encounter (Signed)
  Care Management   Follow Up Note   11/06/2021 Name: Novis League MRN: [id number] DOB: 26-Oct-1950   Referred by: Teodora Medici, DO Reason for referral : Care Coordination   Third unsuccessful telephone outreach was attempted today. The patient was referred to the case management team for assistance with care management and care coordination. The patient's primary care provider has been notified of our unsuccessful attempts to make or maintain contact with the patient. The care management team is pleased to engage with this patient at any time in the future should he/she be interested in assistance from the care management team.   Follow Up Plan: We have been unable to make contact with the patient for follow up. The care management team is available to follow up with the patient after provider conversation with the patient regarding recommendation for care management engagement and subsequent re-referral to the care management team.   Elliot Gurney, [facility] Care Management [phone]

## 2021-11-07 ENCOUNTER — Telehealth: Payer: Self-pay | Admitting: *Deleted

## 2021-11-07 NOTE — Telephone Encounter (Signed)
   Telephone encounter was:  Successful.  11/07/2021 Name: Leslie Houston MRN: [id number] DOB: [birth year]  Leslie Houston is a 71 y.o. year old female who is a primary care patient of Teodora Medici, DO . The community resource team was consulted for assistance with Food Insecurity and Financial Difficulties related to utilities  Talked with Daughter best number to call mom is [phone] if no answer cal daughter rosemary  to give mom number for her to return the call , patient returned call. Informed that MOW was paid for 1 month via [facility] then she was put on waitlist for fulltime MOW , Talked with director patient  should hear from Senior Resources within the next few    Care guide performed the following interventions: Follow up call placed to the patient to discuss status of referral.  Follow Up Plan:  No further follow up planned at this time. The patient has been provided with needed resources.  [place], Care Management  [phone] [address] , [place] [address] Email : Ashby Dawes. [email or web address]-moran [email or web address]

## 2021-11-08 ENCOUNTER — Telehealth: Payer: Self-pay | Admitting: *Deleted

## 2021-11-08 NOTE — Telephone Encounter (Signed)
   Telephone encounter was:  Successful.  11/08/2021 Name: Leslie Houston MRN: [id number] DOB: 1951/03/14  [name] is a 71 y.o. year old female who is a primary care patient of Teodora Medici, DO . The community resource team was consulted for assistance with Food Insecurity and [place] guide performed the following interventions: Follow up call placed to community resources to determine status of patients referral.  Patient returned prior call , explained she was in line to hear from senior resources with the next few months that she had been placed on the list via her referral from [place] in June 2022  Follow Up Plan:  No further follow up planned at this time. The patient has been provided with needed resources.  [place], Care Management  [phone] [address] , [place] [address] Email : Ashby Dawes. [email or web address]-moran [email or web address]

## 2021-11-23 ENCOUNTER — Ambulatory Visit: Payer: Medicare Other | Admitting: Internal Medicine

## 2021-11-28 ENCOUNTER — Other Ambulatory Visit: Payer: Self-pay | Admitting: Physician Assistant

## 2021-11-28 DIAGNOSIS — N319 Neuromuscular dysfunction of bladder, unspecified: Secondary | ICD-10-CM

## 2021-11-29 ENCOUNTER — Telehealth: Payer: Self-pay | Admitting: Physician Assistant

## 2021-11-29 DIAGNOSIS — N319 Neuromuscular dysfunction of bladder, unspecified: Secondary | ICD-10-CM

## 2021-11-29 NOTE — Telephone Encounter (Signed)
Tried calling caregiver, mailbox is full, pt last seen 06/2020, needs appt

## 2021-11-29 NOTE — Telephone Encounter (Signed)
Pt's caregiver called requesting a refill for Flomax.

## 2021-11-30 MED ORDER — TAMSULOSIN HCL 0.4 MG PO CAPS: 0.4000 mg | ORAL_CAPSULE | Freq: Every day | ORAL | 0 refills | Status: DC

## 2021-11-30 NOTE — Telephone Encounter (Signed)
rx sent to pharmacy by e-script Spoke with patient and advised results  30day supply sent to pharmacy

## 2021-11-30 NOTE — Telephone Encounter (Signed)
Pt's caregiver called and scheduled appt w/Sam on 8/15 and would like to know if she can get enough Flomax sent to [facility] to last til her appt.  She is completely out.

## 2021-12-05 ENCOUNTER — Ambulatory Visit (INDEPENDENT_AMBULATORY_CARE_PROVIDER_SITE_OTHER): Payer: Medicare Other | Admitting: Internal Medicine

## 2021-12-05 ENCOUNTER — Encounter: Payer: Self-pay | Admitting: Internal Medicine

## 2021-12-05 VITALS — BP 124/84 | HR 54 | Resp 18 | Ht 59.0 in | Wt 192.0 lb

## 2021-12-05 DIAGNOSIS — I10 Essential (primary) hypertension: Secondary | ICD-10-CM

## 2021-12-05 DIAGNOSIS — D509 Iron deficiency anemia, unspecified: Secondary | ICD-10-CM | POA: Diagnosis not present

## 2021-12-05 DIAGNOSIS — Z8639 Personal history of other endocrine, nutritional and metabolic disease: Secondary | ICD-10-CM

## 2021-12-05 DIAGNOSIS — N184 Chronic kidney disease, stage 4 (severe): Secondary | ICD-10-CM | POA: Diagnosis not present

## 2021-12-05 DIAGNOSIS — K219 Gastro-esophageal reflux disease without esophagitis: Secondary | ICD-10-CM

## 2021-12-05 DIAGNOSIS — E559 Vitamin D deficiency, unspecified: Secondary | ICD-10-CM

## 2021-12-05 DIAGNOSIS — F331 Major depressive disorder, recurrent, moderate: Secondary | ICD-10-CM

## 2021-12-05 DIAGNOSIS — H6983 Other specified disorders of Eustachian tube, bilateral: Secondary | ICD-10-CM | POA: Diagnosis not present

## 2021-12-05 DIAGNOSIS — I2510 Atherosclerotic heart disease of native coronary artery without angina pectoris: Secondary | ICD-10-CM | POA: Diagnosis not present

## 2021-12-05 NOTE — Patient Instructions (Addendum)
It was great seeing you today!  Plan discussed at today's visit: -Blood work ordered today, results will be uploaded to MyChart.  -Use nasal spray 2 sprays on both sides twice a day to help with ears, can do a nasal saline first to avoid mucosal dryness   Follow up in: 3-4 months   Take care and let us know if you have any questions or concerns prior to your next visit.  Dr. Rosana Berger

## 2021-12-05 NOTE — Progress Notes (Signed)
Established Patient Office Visit  Subjective:  Patient ID: Leslie Houston, female    DOB: 1950/11/11  Age: 71 y.o. MRN: [id number]  CC:  Chief Complaint  Patient presents with   Follow-up    HPI Leslie Houston presents for follow up on chronic medical conditions. Accompanied by her friend Leslie Houston.   EAR PAIN Duration: 2-3 days Involved ear(s): bilateral Left is little bit worse  Severity:    Quality:  itchy Fever: no Otorrhea: no Upper respiratory infection symptoms: no Pruritus: yes Hearing loss: yes Water immersion no Using Q-tips: yes Recurrent otitis media: yes History of tubes in one ear, uncertain  Status: fluctuating Treatments attempted: none  Hypertension/CHF: -Medications: Hydralazine 10 twice daily (did get a refill from Nephrology for once a day and uncertain which dose to do, currently only taking it once a day), Torsemide 20 mg -Patient is compliant with above medications and reports no side effects. -Checking BP at home (average): averaging 120/80, no lower readings -Denies any SOB, CP, vision changes, LE edema or symptoms of hypotension -Following with Cardiology, Dr. Rockey Situ, last saw 02/14/21, note reviewed.  Hx of CVA/CAD: -Currently on Zetia and aspirin, statin intolerance history  Lipid Panel     Component Value Date/Time   CHOL 159 12/02/2020 1414   TRIG 67 12/02/2020 1414   HDL 78 12/02/2020 1414   CHOLHDL 2.0 12/02/2020 1414   VLDL 9 11/19/2016 1141   LDLCALC 67 12/02/2020 1414   CKD4: -Last creatinine 8/22 2.95, GFR 16 -Following with Nephrology, last seen 08/29/20 but planning to see again soon this month.   Seizure Disorder: -Currently on Keppra 500 in the a.m., 1000 mg at night doing well, no seizure like activity  -Following with Neurology, planning on follow up in the next 1-2 months   GERD: -Currently on Protonix 20 mg, had been on 40 mg and titrated down, doing really well on this dose, symptoms well controlled.   -Avoiding trigger foods and laying on 3 pillows at night  -Denies nausea, vomiting or abdominal pain.  Weight stable appetite good  MDD: -Mood status: better -Current treatment: Celexa 20 mg (increased at LOV) but was confused about how to take the medication, had been taking it only as needed instead of daily. Now she is taking it daily and is doing really well, would like to stay on this current dose.   -Satisfied with current treatment?: yes -Symptom severity: severe  -Duration of current treatment :  back on this dose for 6 weeks  -Side effects: no Medication compliance: excellent compliance -Had a lot of trauma in past and recent deaths in family, had also been dealing with a stressful home situation that has been resolved now.      12/05/2021    1:16 PM 10/10/2021    2:18 PM 10/10/2021    1:48 PM 10/10/2021    1:40 PM 08/24/2021    3:19 PM  Depression screen PHQ 2/9  Decreased Interest '1 1 1 1 2  ' Down, Depressed, Hopeless '1 1 1 1 3  ' PHQ - 2 Score '2 2 2 2 5  ' Altered sleeping '2 2 2  3  ' Tired, decreased energy '1 2 2  3  ' Change in appetite '1 1 1  3  ' Feeling bad or failure about yourself  2 0 0  3  Trouble concentrating 0 '1 1  2  ' Moving slowly or fidgety/restless 0 '1 1  2  ' Suicidal thoughts 0 0 0  1  PHQ-9 Score  '8 9 9  22  ' Difficult doing work/chores Not difficult at all Not difficult at all Not difficult at all  Somewhat difficult   Health Maintenance: -Blood work due -Mammogram to be scheduled in the next month   Past Medical History:  Diagnosis Date   (HFpEF) heart failure with preserved ejection fraction ([place])    a. Pt reports in 2000 she had CHF, details unclear, outside hospital; b. 06/2019 Echo: EF 50-55%, no rwma, Gr2 DD,  mildly dil LA, mild MR.   CAD (coronary artery disease)    a. Pt reports in 2000 she had a stent to a coronary artery, details unclear, outside hospital.   Carotid stenosis    a. CT angio head 07/2014 - 50-60% stenoses of prox ICA bilaterally.    CKD (chronic kidney disease), stage III ([place])    Diabetes mellitus ([place])    Denies.   Edentulous    No upper teeth   Family hx of colon cancer 06/13/2018   Father diagnosed in his 20's   Gout    HOH (hard of hearing)    usually needs to read lips   Hyperlipidemia    Hypertension    Orthostatic hypotension    Osteopenia 01/24/2018   DEXA Sept 2019   PONV (postoperative nausea and vomiting)    PTSD (post-traumatic stress disorder)        PUD (peptic ulcer disease)    PVC's (premature ventricular contractions)    Seizures (HCC)    Sinus bradycardia    Stroke ([place])    a. L PCA CVA in 05/2014.   Syncope    Weakness    left side S/P stroke    Past Surgical History:  Procedure Laterality Date   BLADDER SURGERY     CARDIAC CATHETERIZATION     CATARACT EXTRACTION W/PHACO Left 03/21/2021   Procedure: CATARACT EXTRACTION PHACO AND INTRAOCULAR LENS PLACEMENT ([place]) left 5.86 00:38.0;  Surgeon: Birder Robson, MD;  Location: [facility];  Service: Ophthalmology;  Laterality: Left;  Latex   CESAREAN SECTION     DENTAL SURGERY     Heart stent     stint      Family History  Problem Relation Age of Onset   Stroke Mother    Heart attack Mother    Lung cancer Mother    Stroke Father    Prostate cancer Father    Colon cancer Father    Diabetes Mellitus II Daughter    Multiple sclerosis Daughter    Kidney disease Son    Heart failure Son    Cancer Paternal Grandfather     Social History   Socioeconomic History   Marital status: Single    Spouse name: Not on file   Number of children: 2   Years of education: 12   Highest education level: Not on file  Occupational History   Occupation: Disabled  Tobacco Use   Smoking status: Former    Types: Cigarettes    Quit date: 03/30/2014    Years since quitting: 7.6   Smokeless tobacco: Never  Vaping Use   Vaping Use: Never used  Substance and Sexual Activity   Alcohol use: No   Drug use: No   Sexual activity: Not  Currently  Other Topics Concern   Not on file  Social History Narrative   Lives at home with her son's father.   Right-handed.   1 cup coffee per day.   Social Determinants of Health   Financial Resource Strain:  Medium Risk (10/25/2021)   Overall Financial Resource Strain (CARDIA)    Difficulty of Paying Living Expenses: Somewhat hard  Food Insecurity: Food Insecurity Present (10/25/2021)   Hunger Vital Sign    Worried About Running Out of Food in the Last Year: Often true    Ran Out of Food in the Last Year: Often true  Transportation Needs: No Transportation Needs (10/25/2021)   PRAPARE - Hydrologist (Medical): No    Lack of Transportation (Non-Medical): No  Physical Activity: Insufficiently Active (10/10/2021)   Exercise Vital Sign    Days of Exercise per Week: 3 days    Minutes of Exercise per Session: 20 min  Stress: No Stress Concern Present (10/10/2021)   [place]    Feeling of Stress : Only a little  Social Connections: Moderately Isolated (10/10/2021)   Social Connection and Isolation Panel [NHANES]    Frequency of Communication with Friends and Family: More than three times a week    Frequency of Social Gatherings with Friends and Family: Three times a week    Attends Religious Services: More than 4 times per year    Active Member of Clubs or Organizations: No    Attends Archivist Meetings: Never    Marital Status: Divorced  Human resources officer Violence: Not At Risk (10/10/2021)   Humiliation, Afraid, Rape, and Kick questionnaire    Fear of Current or Ex-Partner: No    Emotionally Abused: No    Physically Abused: No    Sexually Abused: No    Outpatient Medications Prior to Visit  Medication Sig Dispense Refill   ASPIRIN LOW DOSE 81 MG EC tablet TAKE 1 TABLET BY MOUTH ONCE DAILY *SWALLOW WHOLE* 30 tablet 11   Cholecalciferol (VITAMIN D3) 25 MCG (1000 UT) CAPS Take  1 capsule by mouth daily.     citalopram (CELEXA) 20 MG tablet Take 1 tablet (20 mg total) by mouth daily. 90 tablet 1   ezetimibe (ZETIA) 10 MG tablet TAKE 1 TABLET BY MOUTH ONCE A DAY 90 tablet 3   ferrous sulfate 325 (65 FE) MG tablet Take 325 mg by mouth daily with breakfast.     fluticasone (FLONASE) 50 MCG/ACT nasal spray Place 2 sprays into both nostrils daily as needed for allergies (allergies).      hydrALAZINE (APRESOLINE) 10 MG tablet TAKE 1 TABLET BY MOUTH TWICE A DAY 180 tablet 3   levETIRAcetam (KEPPRA) 500 MG tablet TAKE 1 TABLET BY MOUTH IN THE MORNING AND 2 TABLETS BY MOUTH AT NIGHT. 270 tablet 3   loratadine (CLARITIN) 10 MG tablet TAKE 1 TABLET BY MOUTH ONCE A DAY AS NEEDED FOR ALLERGIES 30 tablet 3   pantoprazole (PROTONIX) 20 MG tablet Take 1 tablet (20 mg total) by mouth daily. 90 tablet 0   tamsulosin (FLOMAX) 0.4 MG CAPS capsule Take 1 capsule (0.4 mg total) by mouth daily. 30 capsule 0   torsemide (DEMADEX) 20 MG tablet TAKE 1 TABLET BY MOUTH ONCE DAILY 90 tablet 1   No facility-administered medications prior to visit.    Allergies  Allergen Reactions   Latex Rash   Penicillin G Itching   Atorvastatin Other (See Comments)    Myalgias    Other Hives    Adhesive tape   Amlodipine Swelling    Leg swelling   Doxycycline     Blood in urine    ROS Review of Systems  Constitutional:  Negative for  chills and fever.  HENT:  Positive for ear pain and hearing loss. Negative for congestion, ear discharge and tinnitus.   Eyes:  Negative for visual disturbance.  Respiratory:  Negative for cough and shortness of breath.   Cardiovascular:  Negative for chest pain and palpitations.  Gastrointestinal:  Negative for abdominal pain, nausea and vomiting.  Neurological:  Negative for seizures.      Objective:    Physical Exam Constitutional:      Appearance: Normal appearance.  HENT:     Head: Normocephalic and atraumatic.     Right Ear: Tympanic membrane, ear canal  and external ear normal.     Left Ear: Tympanic membrane, ear canal and external ear normal.  Eyes:     Conjunctiva/sclera: Conjunctivae normal.  Cardiovascular:     Rate and Rhythm: Normal rate and regular rhythm.  Pulmonary:     Effort: Pulmonary effort is normal.     Breath sounds: Normal breath sounds.  Musculoskeletal:     Right lower leg: No edema.     Left lower leg: No edema.  Skin:    General: Skin is warm and dry.  Neurological:     General: No focal deficit present.     Mental Status: She is alert. Mental status is at baseline.  Psychiatric:        Mood and Affect: Mood normal.        Behavior: Behavior normal.     Pulse (!) 54   Resp 18   Ht '4\' 11"'  (1.499 m)   Wt 192 lb (87.1 kg)   SpO2 95%   BMI 38.78 kg/m  Wt Readings from Last 3 Encounters:  12/05/21 192 lb (87.1 kg)  10/10/21 191 lb 8 oz (86.9 kg)  08/24/21 193 lb 4.8 oz (87.7 kg)     Health Maintenance Due  Topic Date Due   Zoster Vaccines- Shingrix (1 of 2) Never done   MAMMOGRAM  02/13/2019   COVID-19 Vaccine (2 - Moderna series) 03/22/2020   INFLUENZA VACCINE  11/28/2021    There are no preventive care reminders to display for this patient.  Lab Results  Component Value Date   TSH 2.892 07/02/2019   Lab Results  Component Value Date   WBC 5.5 12/02/2020   HGB 11.5 (L) 12/02/2020   HCT 34.6 (L) 12/02/2020   MCV 96.9 12/02/2020   PLT 222 12/02/2020   Lab Results  Component Value Date   NA 142 12/02/2020   K 4.5 12/02/2020   CO2 23 12/02/2020   GLUCOSE 83 12/02/2020   BUN 46 (H) 12/02/2020   CREATININE 2.95 (H) 12/02/2020   BILITOT 0.4 12/02/2020   ALKPHOS 95 05/03/2020   AST 10 12/02/2020   ALT 7 12/02/2020   PROT 7.1 12/02/2020   ALBUMIN 3.8 05/03/2020   CALCIUM 9.3 12/02/2020   ANIONGAP 8 11/18/2020   EGFR 16 (L) 12/02/2020   Lab Results  Component Value Date   CHOL 159 12/02/2020   Lab Results  Component Value Date   HDL 78 12/02/2020   Lab Results  Component  Value Date   LDLCALC 67 12/02/2020   Lab Results  Component Value Date   TRIG 67 12/02/2020   Lab Results  Component Value Date   CHOLHDL 2.0 12/02/2020   Lab Results  Component Value Date   HGBA1C 4.8 01/07/2019      Assessment & Plan:   1. Dysfunction of both eustachian tubes: Given sample of Ryaltris nasal spray to use  2 sprays on each side twice a day, recommend over the counter nasal spray to help avoid nasal mucosal dryness.   2. Moderate episode of recurrent major depressive disorder [facility]): Doing well, continue Celexa 20 mg daily.   3. Essential hypertension: Blood pressure good today, continue Torsemide 20 mg, Hydralazine 10 mg daily. Continue to monitor blood pressure. Due for yearly labs.   - CBC w/Diff/Platelet - COMPLETE METABOLIC PANEL WITH GFR  4. Coronary artery disease involving native coronary artery of native heart without angina pectoris: Recheck cholesterol today. Continue Zetia 10 mg daily.   - Lipid Profile  5. CKD (chronic kidney disease), stage IV ([place]): Recheck kidney function, seeing Nephrology soon.   - COMPLETE METABOLIC PANEL WITH GFR  6. Gastroesophageal reflux disease, unspecified whether esophagitis present: Stable, continue Protonix 20 mg daily.   7. History of diabetes mellitus: Diagnosis in chart of history of diabetes, but all A1c's in the past 7 years normal. Will recheck today.   - HgB A1c  8. Iron deficiency anemia, unspecified iron deficiency anemia type: Check iron today, had been taking over the counter supplements but ran out and has not been taking them lately.   - Fe+TIBC+Fer  9. Vitamin D deficiency: Recheck Vitamin D, currently on Vitamin D 1000 IU daily.   - Vitamin D (25 hydroxy)  Follow-up: Return for 3-4 months .    Teodora Medici, DO

## 2021-12-06 LAB — CBC WITH DIFFERENTIAL/PLATELET
Absolute Monocytes: 368 cells/uL (ref 200–950)
Basophils Absolute: 18 cells/uL (ref 0–200)
Basophils Relative: 0.4 %
Eosinophils Absolute: 51 cells/uL (ref 15–500)
Eosinophils Relative: 1.1 %
HCT: 38.5 % (ref 35.0–45.0)
Hemoglobin: 12.9 g/dL (ref 11.7–15.5)
Lymphs Abs: 1440 cells/uL (ref 850–3900)
MCH: 33.2 pg — ABNORMAL HIGH (ref 27.0–33.0)
MCHC: 33.5 g/dL (ref 32.0–36.0)
MCV: 99.2 fL (ref 80.0–100.0)
MPV: 9.6 fL (ref 7.5–12.5)
Monocytes Relative: 8 %
Neutro Abs: 2723 cells/uL (ref 1500–7800)
Neutrophils Relative %: 59.2 %
Platelets: 237 10*3/uL (ref 140–400)
RBC: 3.88 10*6/uL (ref 3.80–5.10)
RDW: 12.3 % (ref 11.0–15.0)
Total Lymphocyte: 31.3 %
WBC: 4.6 10*3/uL (ref 3.8–10.8)

## 2021-12-06 LAB — HEMOGLOBIN A1C
Hgb A1c MFr Bld: 4.7 % of total Hgb (ref <5.7)
Mean Plasma Glucose: 88 mg/dL
eAG (mmol/L): 4.9 mmol/L

## 2021-12-06 LAB — VITAMIN D 25 HYDROXY (VIT D DEFICIENCY, FRACTURES): Vit D, 25-Hydroxy: 24 ng/mL — ABNORMAL LOW (ref 30–100)

## 2021-12-06 LAB — IRON,TIBC AND FERRITIN PANEL
%SAT: 29 % (calc) (ref 16–45)
Ferritin: 53 ng/mL (ref 16–288)
Iron: 89 ug/dL (ref 45–160)
TIBC: 309 mcg/dL (calc) (ref 250–450)

## 2021-12-06 LAB — LIPID PANEL
Cholesterol: 181 mg/dL (ref <200)
HDL: 73 mg/dL (ref >=50)
LDL Cholesterol (Calc): 87 mg/dL (calc)
Non-HDL Cholesterol (Calc): 108 mg/dL (calc) (ref <130)
Total CHOL/HDL Ratio: 2.5 (calc) (ref <5.0)
Triglycerides: 116 mg/dL (ref <150)

## 2021-12-06 LAB — COMPLETE METABOLIC PANEL WITH GFR
AG Ratio: 1.2 (calc) (ref 1.0–2.5)
ALT: 8 U/L (ref 6–29)
AST: 12 U/L (ref 10–35)
Albumin: 4 g/dL (ref 3.6–5.1)
Alkaline phosphatase (APISO): 104 U/L (ref 37–153)
BUN/Creatinine Ratio: 15 (calc) (ref 6–22)
BUN: 36 mg/dL — ABNORMAL HIGH (ref 7–25)
CO2: 27 mmol/L (ref 20–32)
Calcium: 10 mg/dL (ref 8.6–10.4)
Chloride: 104 mmol/L (ref 98–110)
Creat: 2.43 mg/dL — ABNORMAL HIGH (ref 0.60–1.00)
Globulin: 3.4 g/dL (calc) (ref 1.9–3.7)
Glucose, Bld: 78 mg/dL (ref 65–99)
Potassium: 4.4 mmol/L (ref 3.5–5.3)
Sodium: 140 mmol/L (ref 135–146)
Total Bilirubin: 0.4 mg/dL (ref 0.2–1.2)
Total Protein: 7.4 g/dL (ref 6.1–8.1)
eGFR: 21 mL/min/{1.73_m2} — ABNORMAL LOW (ref >=60)

## 2021-12-06 MED ORDER — VITAMIN D (ERGOCALCIFEROL) 1.25 MG (50000 UNIT) PO CAPS: 50000.0000 [IU] | ORAL_CAPSULE | ORAL | 0 refills | Status: DC

## 2021-12-06 NOTE — Addendum Note (Signed)
Addended by: Teodora Medici on: 12/06/2021 12:52 PM   Modules accepted: Orders

## 2021-12-07 ENCOUNTER — Telehealth: Payer: Self-pay

## 2021-12-07 NOTE — Telephone Encounter (Signed)
Pt given lab results per notes of Dr. Rosana Berger on 12/07/21. Pt verbalized understanding.

## 2021-12-12 ENCOUNTER — Encounter: Payer: Self-pay | Admitting: Physician Assistant

## 2021-12-12 ENCOUNTER — Ambulatory Visit (INDEPENDENT_AMBULATORY_CARE_PROVIDER_SITE_OTHER): Payer: Medicare Other | Admitting: Physician Assistant

## 2021-12-12 VITALS — BP 121/74 | HR 51 | Ht 60.0 in | Wt 192.0 lb

## 2021-12-12 DIAGNOSIS — N319 Neuromuscular dysfunction of bladder, unspecified: Secondary | ICD-10-CM | POA: Diagnosis not present

## 2021-12-12 DIAGNOSIS — R339 Retention of urine, unspecified: Secondary | ICD-10-CM

## 2021-12-12 LAB — BLADDER SCAN AMB NON-IMAGING: SCA Result: 338

## 2021-12-12 MED ORDER — TAMSULOSIN HCL 0.4 MG PO CAPS: 0.4000 mg | ORAL_CAPSULE | Freq: Every day | ORAL | 11 refills | Status: DC

## 2021-12-12 MED ORDER — TAMSULOSIN HCL 0.4 MG PO CAPS: 0.4000 mg | ORAL_CAPSULE | Freq: Every day | ORAL | 0 refills | Status: DC
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
## 2021-12-13 DIAGNOSIS — R600 Localized edema: Secondary | ICD-10-CM | POA: Diagnosis not present

## 2021-12-13 DIAGNOSIS — I129 Hypertensive chronic kidney disease with stage 1 through stage 4 chronic kidney disease, or unspecified chronic kidney disease: Secondary | ICD-10-CM | POA: Diagnosis not present

## 2021-12-13 DIAGNOSIS — N184 Chronic kidney disease, stage 4 (severe): Secondary | ICD-10-CM | POA: Diagnosis not present

## 2021-12-27 ENCOUNTER — Ambulatory Visit: Admission: RE | Admit: 2021-12-27 | Payer: Medicare Other | Source: Ambulatory Visit

## 2021-12-27 ENCOUNTER — Other Ambulatory Visit: Payer: Self-pay | Admitting: Internal Medicine

## 2021-12-27 DIAGNOSIS — E559 Vitamin D deficiency, unspecified: Secondary | ICD-10-CM

## 2021-12-27 DIAGNOSIS — K219 Gastro-esophageal reflux disease without esophagitis: Secondary | ICD-10-CM

## 2021-12-28 ENCOUNTER — Other Ambulatory Visit: Payer: Self-pay | Admitting: Physician Assistant

## 2021-12-28 DIAGNOSIS — N319 Neuromuscular dysfunction of bladder, unspecified: Secondary | ICD-10-CM

## 2021-12-28 NOTE — Telephone Encounter (Signed)
Requested medication (s) are due for refill today: No  Requested medication (s) are on the active medication list: Yes  Last refill:  12/06/21  Future visit scheduled: Yes  Notes to clinic:  Early request.    Requested Prescriptions  Pending Prescriptions Disp Refills   Vitamin D, Ergocalciferol, (DRISDOL) 1.25 MG (50000 UNIT) CAPS capsule [Pharmacy Med Name: VITAMIN D (ERGOCALCIFEROL) 1.25 MG] 12 capsule 0    Sig: TAKE 1 CAPSULE BY MOUTH EVERY 7 DAYS     Endocrinology:  Vitamins - Vitamin D Supplementation 2 Failed - 12/27/2021  1:01 PM      Failed - Manual Review: Route requests for 50,000 IU strength to the provider      Failed - Vitamin D in normal range and within 360 days    Vit D, 25-Hydroxy  Date Value Ref Range Status  12/05/2021 24 (L) 30 - 100 ng/mL Final    Comment:    Vitamin D Status         25-OH Vitamin D: . Deficiency:                    <20 ng/mL Insufficiency:             20 - 29 ng/mL Optimal:                 > or = 30 ng/mL . For 25-OH Vitamin D testing on patients on  D2-supplementation and patients for whom quantitation  of D2 and D3 fractions is required, the [employer](TM) 25-OH VIT D, (D2,D3), LC/MS/MS is recommended: order  code [id number] (patients >39yr). . See Note 1 . Note 1 . For additional information, please refer to  http://education.QuestDiagnostics.com/faq/FAQ199  (This link is being provided for informational/ educational purposes only.)          Passed - Ca in normal range and within 360 days    Calcium  Date Value Ref Range Status  12/05/2021 10.0 8.6 - 10.4 mg/dL Final   Calcium, Total  Date Value Ref Range Status  08/19/2014 10.2 mg/dL Final    Comment:    8.9-10.3 NOTE: New Reference Range  07/06/14          Passed - Valid encounter within last 12 months    Recent Outpatient Visits           3 weeks ago Dysfunction of both eustachian tubes   [place]   2 months ago  [facility] of [facility]   [facility]   4 months ago Moderate episode of recurrent major depressive disorder ([facility]   [facility]   6 months ago Moderate episode of recurrent major depressive disorder ([facility]   [facility]   [facility]       Future Appointments             In 2 months [facility]            Signed Prescriptions Disp Refills   pantoprazole (PROTONIX) 20 MG tablet 90 tablet 0    Sig: TAKE 1 TABLET BY MOUTH ONCE A DAY     Gastroenterology: Proton Pump Inhibitors Passed - 12/27/2021  1:01 PM      Passed - Valid encounter within last 12 months  Recent Outpatient Visits           3 weeks ago Dysfunction of both eustachian tubes   [facility] Te[facility]ora Medici, DO   2 months ago Moderate episode of recurrent major depressive disorder [facility])   [facility] Teodora Medici, DO   4 months ago Moderate episode of recurrent major depressive disorder [facility])   [place], DO   6 months ago Moderate episode of recurrent major depressive disorder [facility])   [place], DO   1 year ago [facility] Delsa Grana, PA-C       Future Appointments             In 2 months Teodora Medici, [facility], [facility]

## 2022-01-02 ENCOUNTER — Inpatient Hospital Stay: Admission: RE | Admit: 2022-01-02 | Payer: Medicare Other | Source: Ambulatory Visit

## 2022-01-05 ENCOUNTER — Ambulatory Visit
Admission: RE | Admit: 2022-01-05 | Discharge: 2022-01-05 | Disposition: A | Discharge status: Home / Self Care | Payer: Medicare Other | Source: Ambulatory Visit | Attending: Physician Assistant | Admitting: Physician Assistant

## 2022-01-05 DIAGNOSIS — N319 Neuromuscular dysfunction of bladder, unspecified: Secondary | ICD-10-CM | POA: Insufficient documentation

## 2022-01-05 DIAGNOSIS — N1330 Unspecified hydronephrosis: Secondary | ICD-10-CM | POA: Diagnosis not present

## 2022-01-11 ENCOUNTER — Telehealth: Payer: Self-pay | Admitting: Physician Assistant

## 2022-01-11 NOTE — Telephone Encounter (Signed)
Called caregiver [name] to discuss RUS results; LM for her to call me back.
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
## 2022-01-18 ENCOUNTER — Ambulatory Visit (INDEPENDENT_AMBULATORY_CARE_PROVIDER_SITE_OTHER): Payer: Medicare Other | Admitting: Physician Assistant

## 2022-01-18 ENCOUNTER — Encounter: Payer: Self-pay | Admitting: Physician Assistant

## 2022-01-18 VITALS — BP 135/79 | HR 55 | Ht 60.0 in | Wt 188.0 lb

## 2022-01-18 DIAGNOSIS — R339 Retention of urine, unspecified: Secondary | ICD-10-CM

## 2022-01-18 DIAGNOSIS — N319 Neuromuscular dysfunction of bladder, unspecified: Secondary | ICD-10-CM | POA: Diagnosis not present

## 2022-01-18 MED ORDER — TAMSULOSIN HCL 0.4 MG PO CAPS: 0.8000 mg | ORAL_CAPSULE | Freq: Every day | ORAL | 0 refills | Status: DC
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
## 2022-01-18 NOTE — Patient Instructions (Addendum)
Increase your Flomax (tamsulosin) to twice daily: one in the morning and one at night. If you get dizzy or lightheaded when you stand up on this increased dose of medication, please call me.  The imaging department will call you to schedule you for a repeat kidney ultrasound in about 2 weeks. I'll see you back in clinic a couple of days later to discuss your results.  A picture of a suprapubic catheter is below.

## 2022-01-19 LAB — BASIC METABOLIC PANEL
BUN/Creatinine Ratio: 13 (ref 12–28)
BUN: 35 mg/dL — ABNORMAL HIGH (ref 8–27)
CO2: 17 mmol/L — ABNORMAL LOW (ref 20–29)
Calcium: 9.6 mg/dL (ref 8.7–10.3)
Chloride: 102 mmol/L (ref 96–106)
Creatinine, Ser: 2.8 mg/dL — ABNORMAL HIGH (ref 0.57–1.00)
Glucose: 90 mg/dL (ref 70–99)
Potassium: 4 mmol/L (ref 3.5–5.2)
Sodium: 140 mmol/L (ref 134–144)
eGFR: 17 mL/min/{1.73_m2} — ABNORMAL LOW (ref >59)

## 2022-01-26 ENCOUNTER — Ambulatory Visit
Admission: RE | Admit: 2022-01-26 | Discharge: 2022-01-26 | Disposition: A | Discharge status: Home / Self Care | Payer: Medicare Other | Source: Ambulatory Visit | Attending: Physician Assistant | Admitting: Physician Assistant

## 2022-01-26 DIAGNOSIS — N319 Neuromuscular dysfunction of bladder, unspecified: Secondary | ICD-10-CM | POA: Insufficient documentation

## 2022-01-26 DIAGNOSIS — N1330 Unspecified hydronephrosis: Secondary | ICD-10-CM | POA: Diagnosis not present

## 2022-01-29 ENCOUNTER — Other Ambulatory Visit: Payer: Self-pay | Admitting: Physician Assistant

## 2022-01-29 ENCOUNTER — Ambulatory Visit (INDEPENDENT_AMBULATORY_CARE_PROVIDER_SITE_OTHER): Payer: Medicare Other | Admitting: Physician Assistant

## 2022-01-29 ENCOUNTER — Encounter: Payer: Self-pay | Admitting: Physician Assistant

## 2022-01-29 VITALS — BP 123/66 | HR 57 | Ht 60.0 in | Wt 190.3 lb

## 2022-01-29 DIAGNOSIS — R339 Retention of urine, unspecified: Secondary | ICD-10-CM

## 2022-01-29 DIAGNOSIS — N1330 Unspecified hydronephrosis: Secondary | ICD-10-CM
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
## 2022-01-29 NOTE — Progress Notes (Signed)
Surgical Physician [facility]  Dr. Erlene Quan * Scheduling expectation : Next Available  *Length of Case:   *Clearance needed: yes, Dr. Rockey Situ  *Anticoagulation Instructions: N/A  *Aspirin Instructions: Hold Aspirin  *Post-op visit Date/Instructions:   4 week SPT change with Sam  *Diagnosis:  Urinary retention, bilateral hydronephrosis  *Procedure:   Cystoscopy, pelvic exam, and SPT placement under anesthesia  Additional orders: N/A  -Admit type: OUTpatient  -Anesthesia: General  -VTE Prophylaxis Standing Order SCD's       Other:   -Standing Lab Orders Per Anesthesia    Lab other: UA&Urine Culture  -Standing Test orders EKG/Chest x-ray per Anesthesia       Test other:   - Medications:  Ancef 2gm IV  -Other orders:  N/A

## 2022-01-31 ENCOUNTER — Other Ambulatory Visit: Payer: Self-pay

## 2022-01-31 ENCOUNTER — Emergency Department
Admission: EM | Admit: 2022-01-31 | Discharge: 2022-01-31 | Disposition: A | Discharge status: Home / Self Care | Payer: Medicare Other | Attending: Emergency Medicine | Admitting: Emergency Medicine

## 2022-01-31 ENCOUNTER — Emergency Department: Payer: Medicare Other

## 2022-01-31 DIAGNOSIS — J069 Acute upper respiratory infection, unspecified: Secondary | ICD-10-CM | POA: Diagnosis not present

## 2022-01-31 DIAGNOSIS — G4489 Other headache syndrome: Secondary | ICD-10-CM | POA: Diagnosis not present

## 2022-01-31 DIAGNOSIS — I10 Essential (primary) hypertension: Secondary | ICD-10-CM | POA: Diagnosis not present

## 2022-01-31 DIAGNOSIS — Z20822 Contact with and (suspected) exposure to covid-19: Secondary | ICD-10-CM | POA: Diagnosis not present

## 2022-01-31 DIAGNOSIS — R569 Unspecified convulsions: Secondary | ICD-10-CM | POA: Insufficient documentation

## 2022-01-31 DIAGNOSIS — R059 Cough, unspecified: Secondary | ICD-10-CM | POA: Diagnosis not present

## 2022-01-31 LAB — CBC
HCT: 36.9 % (ref 36.0–46.0)
Hemoglobin: 11.7 g/dL — ABNORMAL LOW (ref 12.0–15.0)
MCH: 32.2 pg (ref 26.0–34.0)
MCHC: 31.7 g/dL (ref 30.0–36.0)
MCV: 101.7 fL — ABNORMAL HIGH (ref 80.0–100.0)
Platelets: 230 10*3/uL (ref 150–400)
RBC: 3.63 MIL/uL — ABNORMAL LOW (ref 3.87–5.11)
RDW: 11.7 % (ref 11.5–15.5)
WBC: 5.1 10*3/uL (ref 4.0–10.5)
nRBC: 0 % (ref 0.0–0.2)

## 2022-01-31 LAB — COMPREHENSIVE METABOLIC PANEL
ALT: 11 U/L (ref 0–44)
AST: 16 U/L (ref 15–41)
Albumin: 3.5 g/dL (ref 3.5–5.0)
Alkaline Phosphatase: 91 U/L (ref 38–126)
Anion gap: 6 (ref 5–15)
BUN: 39 mg/dL — ABNORMAL HIGH (ref 8–23)
CO2: 22 mmol/L (ref 22–32)
Calcium: 9.1 mg/dL (ref 8.9–10.3)
Chloride: 108 mmol/L (ref 98–111)
Creatinine, Ser: 2.64 mg/dL — ABNORMAL HIGH (ref 0.44–1.00)
GFR, Estimated: 19 mL/min — ABNORMAL LOW (ref >60)
Glucose, Bld: 98 mg/dL (ref 70–99)
Potassium: 3.6 mmol/L (ref 3.5–5.1)
Sodium: 136 mmol/L (ref 135–145)
Total Bilirubin: 0.5 mg/dL (ref 0.3–1.2)
Total Protein: 7.6 g/dL (ref 6.5–8.1)

## 2022-01-31 LAB — RESP PANEL BY RT-PCR (FLU A&B, COVID) ARPGX2
Influenza A by PCR: NEGATIVE
Influenza B by PCR: NEGATIVE
SARS Coronavirus 2 by RT PCR: NEGATIVE

## 2022-01-31 MED ORDER — BENZONATATE 100 MG PO CAPS: 100.0000 mg | ORAL_CAPSULE | Freq: Three times a day (TID) | ORAL | 0 refills | Status: DC | PRN

## 2022-01-31 MED ORDER — LEVETIRACETAM 500 MG PO TABS
500.0000 mg | ORAL_TABLET | Freq: Once | ORAL | Status: AC
  Administered 2022-01-31: 500 mg via ORAL
  Filled 2022-01-31: qty 1

## 2022-01-31 MED ORDER — ACETAMINOPHEN 500 MG PO TABS
1000.0000 mg | ORAL_TABLET | Freq: Once | ORAL | Status: DC
  Filled 2022-01-31: qty 2

## 2022-01-31 NOTE — ED Provider Notes (Signed)
[facility] Provider Note    Event Date/Time   First MD Initiated Contact with Patient 01/31/22 1203     (approximate)   History   Chief Complaint Seizures   HPI Leslie Houston is a 71 y.o. female, history of morbid obesity, hypertension, GERD, CKD, seizures, neutropenia, CHF, prior CVA, carotid stenosis, presents emergency department for evaluation of seizures.  Patient states that she has been having cough, congestion, nausea, and headache since yesterday.  She states that she became concerned when she had a brief seizure today.  She states that her seizure lasted for a couple minutes and involved a stiffness type feeling with inability to focus, however she maintains awareness at all times, consistent with her previous seizures.  Denies any falls or injuries.  Her last 1 was in May of this year.  She currently takes levetiracetam; tablet in the morning and 2 at night.  She states that she has not missed any dosages, however has not taken her levetiracetam for this morning yet.  She states that overall she feels well and does not have headache as bad as what it was earlier.  No fever/chills, chest pain, shortness of breath, abdominal pain, flank pain, vomiting, diarrhea, dysuria, vision changes, hearing changes, rash/lesions, numb/tingling upper lower extremities, or dizziness/lightheadedness.  History Limitations: No limitations.        Physical Exam  Triage Vital Signs: ED Triage Vitals  Enc Vitals Group     BP 01/31/22 1105 (!) 160/104     Pulse Rate 01/31/22 1105 70     Resp 01/31/22 1105 16     Temp 01/31/22 1105 97.9 F (36.6 C)     Temp src --      SpO2 01/31/22 1105 94 %     Weight 01/31/22 1049 185 lb (83.9 kg)     Height 01/31/22 1049 5' (1.524 m)     Head Circumference --      Peak Flow --      Pain Score 01/31/22 1049 0     Pain Loc --      Pain Edu? --      Excl. in [place]? --     Most recent vital signs: Vitals:   01/31/22 1200  01/31/22 1221  BP: (!) 135/59   Pulse: (!) 52 (!) 51  Resp:  14  Temp:    SpO2: 96% 95%    General: Awake, NAD.  Active, audible cough. Skin: Warm, dry. No rashes or lesions.  Eyes: PERRL. Conjunctivae normal.  CV: Good peripheral perfusion.  Resp: Normal effort.  Lung sounds are clear bilaterally in the apices to bases. Abd: Soft, non-tender. No distention.  Neuro: At baseline. No gross neurological deficits.  Cranial nerves II through XII intact. Musculoskeletal: Normal ROM of all extremities.  Focused Exam: N/A.  Physical Exam    ED Results / Procedures / Treatments  Labs (all labs ordered are listed, but only abnormal results are displayed) Labs Reviewed  CBC - Abnormal; Notable for the following components:      Result Value   RBC 3.63 (*)    Hemoglobin 11.7 (*)    MCV 101.7 (*)    All other components within normal limits  COMPREHENSIVE METABOLIC PANEL - Abnormal; Notable for the following components:   BUN 39 (*)    Creatinine, Ser 2.64 (*)    GFR, Estimated 19 (*)    All other components within normal limits  RESP PANEL BY RT-PCR (FLU A&B, COVID) [id number]  LEVETIRACETAM LEVEL     EKG Sinus bradycardia with first-degree AV block, rate of 51, left posterior fascicular block, nonspecific T wave abnormality, no ST segment elevation or depression, no signs of second or third-degree AV blocks.  QTc be 448.    RADIOLOGY  ED Provider Interpretation: I personally viewed and interpreted this x-ray, no evidence of acute cardiopulmonary abnormalities.  DG Chest 2 View  Result Date: 01/31/2022 CLINICAL DATA:  Cough. EXAM: CHEST - 2 VIEW COMPARISON:  July 02, 2019. FINDINGS: Stable cardiomediastinal silhouette. Both lungs are clear. The visualized skeletal structures are unremarkable. IMPRESSION: No active cardiopulmonary disease. Electronically Signed   By: Marijo Conception M.D.   On: 01/31/2022 12:54    PROCEDURES:  Critical Care performed:  N/A.  Procedures    MEDICATIONS ORDERED IN ED: Medications  levETIRAcetam (KEPPRA) tablet 500 mg (500 mg Oral Given 01/31/22 1218)     IMPRESSION / MDM / ASSESSMENT AND PLAN / ED COURSE  I reviewed the triage vital signs and the nursing notes.                              Differential diagnosis includes, but is not limited to, seizures, viral URI, COVID-19, influenza, pneumonia.  ED Course Patient appears well, vitals within normal limits.  NAD.  Given that she has not had her first dose of levetiracetam this morning, will provide her with a dose here.  CBC shows no leukocytosis.  Mild anemia present at 11.7, consistent with previous values.  CMP shows elevated creatinine at 2.64, consistent with previous values.  Respiratory panel negative for COVID-19 or influenza.  Assessment/Plan Patient presents for evaluation of seizure this morning in the setting of recent viral URI symptoms.  She appears well clinically.  No gross neurological deficits.  She states that she feels well now.  Chest x-ray shows no evidence of pneumonia.  Lab work-up is similar to her baseline values.  Respiratory panel negative for COVID-19 or influenza.  Suspicion for any serious or life-threatening pathology.  I suspect that she likely experienced a breakthrough seizure secondary to a viral syndrome.  She has access to her daily medications and will be seeing neurology soon.  I believe she is safe for discharge at this time.  We will provide her with a brief prescription for benzonatate to help with her cough.  Considered admission for this patient, but given her stable presentation and unremarkable work-up, she is unlikely benefit from admission.  Provided the patient with anticipatory guidance, return precautions, and educational material. Encouraged the patient to return to the emergency department at any time if they begin to experience any new or worsening symptoms. Patient expressed understanding and  agreed with the plan.   Patient's presentation is most consistent with acute complicated illness / injury requiring diagnostic workup.       FINAL CLINICAL IMPRESSION(S) / ED DIAGNOSES   Final diagnoses:  Viral URI with cough  Seizure ([place])     Rx / DC Orders   ED Discharge Orders          Ordered    benzonatate (TESSALON PERLES) 100 MG capsule  3 times daily PRN        01/31/22 1449             Note:  This document was prepared using Dragon voice recognition software and may include unintentional dictation errors.   Teodoro Spray, [place] 01/31/22 1451  Blake Divine, MD 01/31/22 [phone]

## 2022-01-31 NOTE — ED Triage Notes (Signed)
Pt in with co headache and nausea since yesterday. Pt states today she had a seizure, hx of the same. States during her seizures she Is aware, states she becomes stiff and cannot focus. Pt states last one in may. Pt also co cough and congestion.

## 2022-01-31 NOTE — Discharge Instructions (Addendum)
-  You may take the benzonatate as needed for cough.  Please follow-up with your primary care provider within the next few days to ensure that your symptoms do not develop into pneumonia.  -Please schedule appoint with your neurologist as discussed.  You cannot see them soon, you may schedule an appoint with the neurologist listed in these instructions.  -Please continue to take your levetiracetam as usual.  -Return to the emergency department anytime if you begin to experience any new or worsening symptoms.

## 2022-02-01 LAB — LEVETIRACETAM LEVEL: Levetiracetam Lvl: 18.1 ug/mL (ref 10.0–40.0)

## 2022-02-02 ENCOUNTER — Telehealth: Payer: Self-pay

## 2022-02-02 ENCOUNTER — Telehealth: Payer: Self-pay | Admitting: *Deleted

## 2022-02-02 NOTE — Progress Notes (Signed)
[facility] Posting Form   Surgery Date/Time: Date: 02/12/2022  Surgeon: Dr. [name], MD  Surgery Location: Day Surgery  Inpt ( No  )   Outpt (Yes)   Obs ( No  )   Diagnosis: Bilateral Hydronephrosis N13.30, Urinary Retention R33.9  -CPT: 52000, 51102, 57410  Surgery: Cystoscopy, Pelvic Exam under Anesthesia, Suprapubic Tube Placement   Stop Anticoagulations: Yes, will need to hold ASA  Cardiac/Medical/Pulmonary Clearance needed: yes  Clearance needed from Dr: Rockey Situ, Dr. Krista Blue  Clearance request sent on: Date: 02/02/22  *Orders entered into EPIC  Date: 02/02/22   *Case booked in EPIC  Date: 02/01/2022  *Notified pt of Surgery: Date: 02/01/2022  PRE-OP UA & CX: yes, will obtain in clinic on 02/06/2022  *Placed into Prior Authorization Work [name] Date: 02/02/22   Assistant/laser/rep:No

## 2022-02-02 NOTE — Telephone Encounter (Signed)
I spoke with [name] (Mrs. Robillard's Caregiver) and Mrs. Weigel while in the office. We have discussed possible surgery dates and Monday October 16th, 2023 was agreed upon by all parties. Patient given information about surgery date, what to expect pre-operatively and post operatively.  We discussed that a Pre-Admission Testing office will be calling to set up the pre-op visit that will take place prior to surgery, and that these appointments are typically done over the phone with a Pre-Admissions RN.  Informed patient that our office will communicate any additional care to be provided after surgery. Patients questions or concerns were discussed during our call. Advised to call our office should there be any additional information, questions or concerns that arise. Patient verbalized understanding.

## 2022-02-02 NOTE — Progress Notes (Signed)
REQUEST FOR SURGICAL CLEARANCE       Date: Date: 02/02/22  Faxed to: Dr. Krista Blue  Surgeon: Dr. [name], MD     Date of Surgery: 02/12/2022  Operation: Cystoscopy, Pelvic Exam under Anesthesia, Suprapubic Tube Placement   Anesthesia Type: General   Diagnosis: Urinary Retention, Bilateral Hydronephrosis  Patient Requires:   Medical Clearance : Yes  Reason: Patient recently had a seizure, would like to see if patient is stable to proceed with surgery    Risk Assessment:    Low   '[]'$       Moderate   '[]'$     High   '[]'$           This patient is optimized for surgery  YES '[]'$       NO   '[]'$    I recommend further assessment/workup prior to surgery. YES '[]'$      NO  '[]'$   Appointment scheduled for: _______________________   Further recommendations: ____________________________________     Physician Signature:__________________________________   Printed Name: ________________________________________   Date: _________________

## 2022-02-02 NOTE — Telephone Encounter (Signed)
-----   Message from Karen Kitchens, NP sent at 02/02/2022  8:37 AM EDT ----- Regarding: Request for pre-operative cardiac clearance Request for pre-operative cardiac clearance:  1. What type of surgery is being performed?  CYSTOSCOPY; INSERTION OF SUPRAPUBIC CATHETER; PELVIC EXAM UNDER ANESTHESIA    2. When is this surgery scheduled?  02/12/2022  3. Type of clearance being requested (medical, pharmacy, both)? BOTH   4. Are there any medications that need to be held prior to surgery? ASA  5. Practice name and name of physician performing surgery?  Performing surgeon: Dr. [name], MD Requesting clearance: Honor Loh, FNP-C    6. Anesthesia type (none, local, MAC, general)? GENERAL  7. What is the office phone and fax number?   Phone: [phone] Fax: [phone]  ATTENTION: Unable to create telephone message as per your standard workflow. Directed by [facility] providers to send requests for cardiac clearance to this pool for appropriate distribution to provider covering pre-operative clearances.   Honor Loh, MSN, APRN, FNP-C, CEN [facility]  Peri-operative Services Nurse Practitioner Phone: [phone] 02/02/22 8:37 AM

## 2022-02-02 NOTE — Telephone Encounter (Signed)
   Name: Leslie Houston  DOB: 05/26/1950  MRN: [id number]  Primary Cardiologist: Ida Rogue, MD   Preoperative team, please contact this patient and set up a phone call appointment for further preoperative risk assessment. Please obtain consent and complete medication review. Thank you for your help.  I confirm that guidance regarding antiplatelet and oral anticoagulation therapy has been completed and, if necessary, noted below.  Her aspirin is prescribed by a noncardiology provider.  Recommendations for holding aspirin will need to come from prescribing provider.   Deberah Pelton, NP 02/02/2022, 9:49 AM [place]

## 2022-02-02 NOTE — Telephone Encounter (Signed)
I called what I thought was the number for the pt, though was for her friend, caregiver Leslie Houston. In speaking with Leslie Houston about a tele pre opa ppt, she tells me that an in office appt would be better for the pt as the pt is disabled. Leslie Houston, tells me the pt just had a seizure the other day and has now been set up with neuro 02/05/22 @ 10:30.   I was going to offer 10/9 10:05, though this will not work due to pt see's neuro 10:30 that same day. Leslie Houston asked if they could be seen 02/06/22 in the afternoon as the pt has an appt with pulmonary 4:30 and that if she could [place] same day would really help out. I stated that I will have to send a message to the scheduling team for [facility] office to see if they can schedule pt with Dr. Rockey Situ 3:20 (DOD). I stated that the [facility] office will call and help make appt. Leslie Houston thanked me for the help.

## 2022-02-05 ENCOUNTER — Ambulatory Visit: Payer: Medicare Other | Admitting: Neurology

## 2022-02-05 ENCOUNTER — Telehealth: Payer: Self-pay | Admitting: Urology

## 2022-02-05 ENCOUNTER — Other Ambulatory Visit: Payer: Self-pay | Admitting: Physician Assistant

## 2022-02-05 DIAGNOSIS — N1330 Unspecified hydronephrosis: Secondary | ICD-10-CM

## 2022-02-05 NOTE — Telephone Encounter (Signed)
Patient's caregiver, [name] [phone]) called the office today requesting a call back from Sam and to cancel the appointment with Dr. Erlene Quan on 10/10.  Patient's daughter is upset about the appointment to see Dr. Erlene Quan to discuss surgery.

## 2022-02-05 NOTE — Telephone Encounter (Signed)
I just spoke with the patient's daughter, [name], via telephone.  She reports that she is unsure if her mother has been compliant with tamsulosin 0.8 mg daily and she would like to be extra sure that she has been taking this medication appropriately, given it ample time to work, and her mother has had follow-up imaging confirming persistent hydronephrosis before proceeding with a possible procedure.  I offered her another repeat renal ultrasound in 1 week.  She is in agreement with this plan.  We discussed that the risks of delaying another round of imaging further include permanent renal damage due to hydronephrosis.  I will call her and [name] with Ms. Carrizales's ultrasound results next week.  If she has persistent hydronephrosis at that time, we will reschedule her to come in to see Dr. Erlene Quan and discuss cystoscopy and pelvic ultrasound under anesthesia with SP tube placement as previously planned.

## 2022-02-06 ENCOUNTER — Ambulatory Visit: Payer: Medicare Other | Admitting: Urology

## 2022-02-06 NOTE — Telephone Encounter (Signed)
Cancelled surgery for 10/16 with Dr. Erlene Quan, Will reschedule after patients visit.

## 2022-02-06 NOTE — Telephone Encounter (Signed)
I will update the requesting office as to pt prefers in office appt. I have sent a message to [facility] scheduling to see if they may be able to help please schedule an appt. Pt's procedure may possibly need to be moved out until she has  been cleared by cardiology. See notes from 02/02/22 where I s/w pt's caregiver and she tells me the pt just very recently had a seizure as well. Our team is trying to work on getting an in office appt for pre op clearance, per request.

## 2022-02-07 ENCOUNTER — Other Ambulatory Visit: Payer: Medicare Other

## 2022-02-07 NOTE — Telephone Encounter (Signed)
Have sent a message to our scheduling team to see if the may please help make an appt for the pt for pre op clearance. Pt request an in office appt. See previous notes. Pt procedure may need to be pushed out until the pt has been cleared with the cardiology. I will update the requesting office as FYI.

## 2022-02-09 ENCOUNTER — Telehealth: Payer: Self-pay | Admitting: *Deleted

## 2022-02-09 NOTE — Telephone Encounter (Signed)
   Telephone encounter was:  Successful.  02/09/2022 Name: Leslie Houston MRN: [id number] DOB: 1950-06-25  Leslie Houston is a 71 y.o. year old female who is a primary care patient of Teodora Medici, DO . The community resource team was consulted for assistance with Food Insecurity Daughter called in for update on MOW list they have done an assessment this sumer and patient still has not heard back from the senior resources provided number to daughter as well as information that referral given in 2022 through [place] cares database attemped call to senior resources  Care guide performed the following interventions: Patient provided with information about care guide support team and interviewed to confirm resource needs Follow up call placed to community resources to determine status of patients referral.  Follow Up Plan:  No further follow up planned at this time. The patient has been provided with needed resources.  [place] [phone] [address] , [place] [address] Email : [email or web address]

## 2022-02-09 NOTE — Telephone Encounter (Signed)
I s/w [name], pt's caregiver. I asked if someone from the [facility] called them yet to make in office appt, she tells me no. I have sent a message x 2 to our scheduling team asking if they would please help make appt for the pt before the original procedure date. However they state they received a call to make appt. Procedure has been post poned at this time. I have scheduled the pt to see Ignacia Bayley, NP 02/23/22 @ 2:45. [name] is grateful for the help. I will update NP for the appt as well as FYI to requesting office.

## 2022-02-12 ENCOUNTER — Ambulatory Visit: Admit: 2022-02-12 | Payer: Medicare Other | Admitting: Urology

## 2022-02-12 SURGERY — CYSTOSCOPY
Anesthesia: General

## 2022-02-14 ENCOUNTER — Ambulatory Visit
Admission: RE | Admit: 2022-02-14 | Discharge: 2022-02-14 | Disposition: A | Discharge status: Home / Self Care | Payer: Medicare Other | Source: Ambulatory Visit | Attending: Physician Assistant | Admitting: Physician Assistant

## 2022-02-14 DIAGNOSIS — N1330 Unspecified hydronephrosis: Secondary | ICD-10-CM

## 2022-02-15 ENCOUNTER — Ambulatory Visit (INDEPENDENT_AMBULATORY_CARE_PROVIDER_SITE_OTHER): Payer: Medicare Other | Admitting: Neurology

## 2022-02-15 ENCOUNTER — Telehealth: Payer: Self-pay | Admitting: Neurology

## 2022-02-15 ENCOUNTER — Encounter: Payer: Self-pay | Admitting: Neurology

## 2022-02-15 VITALS — BP 136/77 | HR 60 | Ht 61.0 in | Wt 185.0 lb

## 2022-02-15 DIAGNOSIS — R339 Retention of urine, unspecified: Secondary | ICD-10-CM

## 2022-02-15 DIAGNOSIS — G4733 Obstructive sleep apnea (adult) (pediatric): Secondary | ICD-10-CM | POA: Diagnosis not present

## 2022-02-15 DIAGNOSIS — R269 Unspecified abnormalities of gait and mobility: Secondary | ICD-10-CM | POA: Diagnosis not present

## 2022-02-15 DIAGNOSIS — G40209 Localization-related (focal) (partial) symptomatic epilepsy and epileptic syndromes with complex partial seizures, not intractable, without status epilepticus: Secondary | ICD-10-CM | POA: Diagnosis not present

## 2022-02-15 DIAGNOSIS — I639 Cerebral infarction, unspecified: Secondary | ICD-10-CM | POA: Diagnosis not present

## 2022-02-15 DIAGNOSIS — I2510 Atherosclerotic heart disease of native coronary artery without angina pectoris: Secondary | ICD-10-CM | POA: Diagnosis not present

## 2022-02-15 MED ORDER — LEVETIRACETAM 1000 MG PO TABS: 1000.0000 mg | ORAL_TABLET | Freq: Two times a day (BID) | ORAL | 3 refills | Status: DC

## 2022-02-15 MED ORDER — ALPRAZOLAM 1 MG PO TABS: ORAL_TABLET | ORAL | 0 refills | Status: DC

## 2022-02-15 NOTE — Addendum Note (Signed)
Addended by: Marcial Pacas on: 02/15/2022 09:41 AM   Modules accepted: Orders

## 2022-02-15 NOTE — Progress Notes (Addendum)
Chief Complaint  Patient presents with   New Patient (Initial Visit)    Rm 14,  States she is doing well, no new seizures       ASSESSMENT AND PLAN  Leslie Houston is a 71 y.o. female   Epilepsy, mild mental retardation, born premature Large left occipital stroke in 2016  Recurrent seizure while taking Keppra 500/1000 on February 01, 2022  Repeat EEG  Increase Keppra to 1000 mg twice daily Slow worsening gait abnormality, urinary retention,  Brisk reflex on examinations, stiff gait,  MRI cervical spine to rule out cervical spondylitic myelopathy  History of obstructive sleep apnea  Last sleep study was 10 years ago, no poor sleep quality, old CPAP machine is no longer functioning  Referred for sleep study   DIAGNOSTIC DATA (LABS, IMAGING, TESTING) - I reviewed patient records, labs, notes, testing and imaging myself where available.   MEDICAL HISTORY:  Leslie Houston, is a 71 year old female, accompanied by her friends, follow-up for epilepsy, her primary care physician is Leslie Houston, Leslie Houston,  I reviewed and summarized the referring note. Depression Mild mental retardation Incontinence Stroke Obstructive sleep apnea Coronary artery disease, history of stent, Hypertension Hyperlipidemia  She has been patient of our clinic for a long time for epilepsy. 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   She had a history of epilepsy, generalized tonic-clonic seizure when she was young, since her most recent left occipital stroke early 2016, she began to have frequent spells, proceeding by smelling dusty smell, eyes crossed, transient confusion , she sometimes fell to the ground, lasting less than a minute, she can have few  spells in one day.She presented to hospital multiple times for similar presentation, I have   MRI of brain in May 2016, large left occipital stroke, mild small vessel disease at supratentorium.  Echocardiogram April 2016, normal ejection fraction 55-60%, no significant structural abnormality,   Cardiac monitoring showed no significant abnormality,  She was started on Keppra since 2015, initially complaint side effect, had a shorter trial of Topamax, because even worse GI side effect, went back on Keppra, on titrating dose, now taking [name] admission in 2021 for worsening kidney function, found to have urinary retention, unknown etiology, entertaining suprapubic catheter, which has caused a lot of stress  Most recurrent seizure was on February 01, 2022, walking out of the bathroom, she suddenly went into a trance, confused, was cought by her family at a standing position   She also complains of slow worsening gait abnormality, denies significant upper or lower extremity paresthesia  Was diagnosed with obstructive sleep apnea in the past, last sleep study was 10 years ago, old CPAP machine is no longer function  PHYSICAL EXAM:   Vitals:   02/15/22 0859  BP: 136/77  Pulse: 60  Weight: 185 lb (83.9 kg)  Height: '5\' 1"'$  (1.549 m)     Body mass index is 34.96 kg/m.  PHYSICAL EXAMNIATION:  Gen: NAD, conversant, well nourised, well groomed                     Cardiovascular: Regular rate rhythm, no peripheral edema, warm, nontender. Eyes: Conjunctivae clear without exudates or hemorrhage Neck: Supple, no carotid bruits. Pulmonary: Clear  to auscultation bilaterally   NEUROLOGICAL EXAM:  MENTAL STATUS: Speech/cognition: Anxious looking elderly female, reliable friend Leslie Houston to provide history, hard of hearing, cooperative on examination CRANIAL NERVES: CN II:  Pupils are round equal and briskly reactive to light.  Right hemivisual field deficit CN III, IV, VI:  extraocular movement are normal. No ptosis. CN V: Facial sensation is intact to light touch CN VII: Face is symmetric with normal eye closure  CN VIII: Hearing is normal to causal conversation. CN IX, X: Phonation is normal. CN XI: Head turning and shoulder shrug are intact  MOTOR: There is no pronator drift of out-stretched arms. Muscle bulk and tone are normal. Muscle strength is normal.  REFLEXES: Reflexes are 2+ and symmetric at the biceps, triceps, 3/3 knees, and ankles. Plantar responses are extensor bilaterally  SENSORY: Intact to light touch, pinprick and vibratory sensation are intact in fingers and toes.  COORDINATION: There is no trunk or limb dysmetria noted.  GAIT/STANCE: Need push-up to get up from seated position, wide-based, stiff, cautious, valgus knee  REVIEW OF SYSTEMS:  Full 14 system review of systems performed and notable only for as above All other review of systems were negative.   ALLERGIES: Allergies  Allergen Reactions   Latex Rash   Penicillin G Itching   Atorvastatin Other (See Comments)    Myalgias    Other Hives    Adhesive tape   Amlodipine Swelling    Leg swelling   Doxycycline     Blood in urine    HOME MEDICATIONS: Current Outpatient Medications  Medication Sig Dispense Refill   ASPIRIN LOW DOSE 81 MG EC tablet TAKE 1 TABLET BY MOUTH ONCE DAILY *SWALLOW WHOLE* 30 tablet 11   benzonatate (TESSALON PERLES) 100 MG capsule Take 1 capsule (100 mg total) by mouth 3 (three) times daily as needed for cough. 30 capsule 0   Cholecalciferol (VITAMIN D3) 25 MCG (1000 UT) CAPS Take 1 capsule by mouth daily.     citalopram (CELEXA) 20 MG tablet Take 1 tablet (20 mg total) by mouth daily. 90 tablet 1   ezetimibe (ZETIA) 10 MG tablet TAKE 1 TABLET BY MOUTH ONCE A DAY 90 tablet 3   ferrous sulfate 325 (65 FE) MG tablet Take 325 mg by mouth daily with breakfast.     fluticasone (FLONASE) 50 MCG/ACT nasal spray Place 2 sprays into both nostrils daily  as needed for allergies (allergies).      hydrALAZINE (APRESOLINE) 10 MG tablet TAKE 1 TABLET BY MOUTH TWICE A DAY 180 tablet 3   levETIRAcetam (KEPPRA) 500 MG tablet TAKE 1 TABLET BY MOUTH IN THE MORNING AND 2 TABLETS BY MOUTH AT NIGHT. 270 tablet 3   loratadine (CLARITIN) 10 MG tablet TAKE 1 TABLET BY MOUTH ONCE A DAY AS NEEDED FOR ALLERGIES 30 tablet 3   pantoprazole (PROTONIX) 20 MG tablet TAKE 1 TABLET BY MOUTH ONCE A DAY 90 tablet 0   tamsulosin (FLOMAX) 0.4 MG CAPS capsule Take 2 capsules (0.8 mg total) by mouth daily. Take one in the morning and one at night. 60 capsule 0   torsemide (DEMADEX) 20 MG tablet TAKE 1 TABLET BY MOUTH ONCE DAILY 90 tablet 1   Vitamin D, Ergocalciferol, (DRISDOL) 1.25 MG (50000 UNIT) CAPS capsule Take 1 capsule (50,000 Units total) by mouth every 7 (seven) days. 12 capsule 0   No current facility-administered medications for this visit.    PAST MEDICAL HISTORY: Past Medical History:  Diagnosis Date   (HFpEF) heart  failure with preserved ejection fraction ([place])    a. Pt reports in 2000 she had CHF, details unclear, outside hospital; b. 06/2019 Echo: EF 50-55%, no rwma, Gr2 DD,  mildly dil LA, mild MR.   CAD (coronary artery disease)    a. Pt reports in 2000 she had a stent to a coronary artery, details unclear, outside hospital.   Carotid stenosis    a. CT angio head 07/2014 - 50-60% stenoses of prox ICA bilaterally.   CKD (chronic kidney disease), stage III ([place])    Diabetes mellitus ([place])    Denies.   Edentulous    No upper teeth   Family hx of colon cancer 06/13/2018   Father diagnosed in his 87's   Gout    HOH (hard of hearing)    usually needs to read lips   Hyperlipidemia    Hypertension    Orthostatic hypotension    Osteopenia 01/24/2018   DEXA Sept 2019   PONV (postoperative nausea and vomiting)    PTSD (post-traumatic stress disorder)        PUD (peptic ulcer disease)    PVC's (premature ventricular contractions)    Seizures ([facility])     Sinus bradycardia    Stroke ([place])    a. L PCA CVA in 05/2014.   Syncope    Weakness    left side S/P stroke    PAST SURGICAL HISTORY: Past Surgical History:  Procedure Laterality Date   BLADDER SURGERY     CARDIAC CATHETERIZATION     CATARACT EXTRACTION W/PHACO Left 03/21/2021   Procedure: CATARACT EXTRACTION PHACO AND INTRAOCULAR LENS PLACEMENT ([place]) left 5.86 00:38.0;  Surgeon: Birder Robson, MD;  Location: [facility];  Service: Ophthalmology;  Laterality: Left;  Latex   CESAREAN SECTION     DENTAL SURGERY     Heart stent     stint      FAMILY HISTORY: Family History  Problem Relation Age of Onset   Stroke Mother    Heart attack Mother    Lung cancer Mother    Stroke Father    Prostate cancer Father    Colon cancer Father    Diabetes Mellitus II Daughter    Multiple sclerosis Daughter    Kidney disease Son    Heart failure Son    Cancer Paternal Grandfather     SOCIAL HISTORY: Social History   Socioeconomic History   Marital status: Single    Spouse name: Not on file   Number of children: 2   Years of education: 12   Highest education level: Not on file  Occupational History   Occupation: Disabled  Tobacco Use   Smoking status: Former    Types: Cigarettes    Quit date: 03/30/2014    Years since quitting: 7.8   Smokeless tobacco: Never  Vaping Use   Vaping Use: Never used  Substance and Sexual Activity   Alcohol use: No   Drug use: No   Sexual activity: Not Currently  Other Topics Concern   Not on file  Social History Narrative   Lives at home with her son's father.   Right-handed.   1 cup coffee per day.   Social Determinants of Health   Financial Resource Strain: Medium Risk (10/25/2021)   Overall Financial Resource Strain (CARDIA)    Difficulty of Paying Living Expenses: Somewhat hard  Food Insecurity: Food Insecurity Present (10/25/2021)   Hunger Vital Sign    Worried About Running Out of Food in the Last Year:  Often true    Ran  Out of Food in the Last Year: Often true  Transportation Needs: No Transportation Needs (10/25/2021)   PRAPARE - Hydrologist (Medical): No    Lack of Transportation (Non-Medical): No  Physical Activity: Insufficiently Active (10/10/2021)   Exercise Vital Sign    Days of Exercise per Week: 3 days    Minutes of Exercise per Session: 20 min  Stress: No Stress Concern Present (10/10/2021)   [place]    Feeling of Stress : Only a little  Social Connections: Moderately Isolated (10/10/2021)   Social Connection and Isolation Panel [NHANES]    Frequency of Communication with Friends and Family: More than three times a week    Frequency of Social Gatherings with Friends and Family: Three times a week    Attends Religious Services: More than 4 times per year    Active Member of Clubs or Organizations: No    Attends Archivist Meetings: Never    Marital Status: Divorced  Human resources officer Violence: Not At Risk (10/10/2021)   Humiliation, Afraid, Rape, and Kick questionnaire    Fear of Current or Ex-Partner: No    Emotionally Abused: No    Physically Abused: No    Sexually Abused: No      Marcial Pacas, M.D. Ph.D.  [facility] [address], [place], [place] [address] Ph: [phone] Fax: [phone]  CC:  Teodora Medici, [address],  [place] [address]  Teodora Medici, DO

## 2022-02-15 NOTE — Telephone Encounter (Signed)
medicare/medicaid NPR sent to [facility] [phone] 

## 2022-02-16 ENCOUNTER — Telehealth: Payer: Self-pay | Admitting: *Deleted

## 2022-02-16 NOTE — Telephone Encounter (Signed)
-----   Message from Debroah Loop, [place] sent at 02/15/2022  5:25 PM EDT ----- Please call [name] and daughter Leslie Houston and explain that her urinary retention and bilateral, severe hydronephrosis remain unchanged. We need to proceed with the procedure we discussed to protect her kidney function. Please schedule all three of them to come in and meet Dr. Erlene Quan to discuss further ASAP.

## 2022-02-16 NOTE — Telephone Encounter (Signed)
Spoke with daughter and advised results, she is ready to proceed with surgery. Appt scheduled to discuss with Dr. Erlene Quan

## 2022-02-16 NOTE — Telephone Encounter (Signed)
.  left message to have patient's daughter return my call.   Spoke with [name], scheduled appt with Dr. Erlene Quan, will call daughter back again.

## 2022-02-23 ENCOUNTER — Ambulatory Visit: Payer: Medicare Other | Attending: Nurse Practitioner | Admitting: Student

## 2022-02-23 ENCOUNTER — Encounter: Payer: Self-pay | Admitting: Nurse Practitioner

## 2022-02-23 VITALS — BP 126/70 | HR 50 | Ht 61.0 in | Wt 192.0 lb

## 2022-02-23 DIAGNOSIS — Z01818 Encounter for other preprocedural examination: Secondary | ICD-10-CM | POA: Insufficient documentation

## 2022-02-23 DIAGNOSIS — I5032 Chronic diastolic (congestive) heart failure: Secondary | ICD-10-CM | POA: Diagnosis not present

## 2022-02-23 DIAGNOSIS — E782 Mixed hyperlipidemia: Secondary | ICD-10-CM | POA: Diagnosis not present

## 2022-02-23 DIAGNOSIS — I10 Essential (primary) hypertension: Secondary | ICD-10-CM | POA: Insufficient documentation

## 2022-02-23 DIAGNOSIS — N184 Chronic kidney disease, stage 4 (severe): Secondary | ICD-10-CM | POA: Diagnosis not present

## 2022-02-23 DIAGNOSIS — I2510 Atherosclerotic heart disease of native coronary artery without angina pectoris: Secondary | ICD-10-CM | POA: Diagnosis not present

## 2022-02-23 NOTE — Progress Notes (Signed)
Cardiology Clinic Note   Patient Name: Leslie Houston Date of Encounter: 02/23/2022  Primary Care Provider:  Teodora Medici, DO Primary Cardiologist:  Ida Rogue, MD  Patient Profile    Leslie Houston is a 71 year-old female with a past medical history of HFpEF, CAD, carotid stenosis, hypertension, hyperlipidemia, CKD stage IV, CVA, and anemia who presents to the clinic today for preoperative cardiac clearance.  Past Medical History    Past Medical History:  Diagnosis Date   (HFpEF) heart failure with preserved ejection fraction ([place])    a. Pt reports in 2000 she had CHF, details unclear, outside hospital; b. 06/2019 Echo: EF 50-55%, no rwma, Gr2 DD,  mildly dil LA, mild MR.   CAD (coronary artery disease)    a. Pt reports in 2000 she had a stent to a coronary artery, details unclear, outside hospital.   Carotid stenosis    a. CT angio head 07/2014 - 50-60% stenoses of prox ICA bilaterally.   CKD (chronic kidney disease), stage IV ([place])    Diabetes mellitus ([place])    Denies.   Edentulous    No upper teeth   Family hx of colon cancer 06/13/2018   Father diagnosed in his 63's   Gout    HOH (hard of hearing)    usually needs to read lips   Hyperlipidemia    Hypertension    Orthostatic hypotension    Osteopenia 01/24/2018   DEXA Sept 2019   PONV (postoperative nausea and vomiting)    PTSD (post-traumatic stress disorder)        PUD (peptic ulcer disease)    PVC's (premature ventricular contractions)    Seizures (HCC)    Sinus bradycardia    Stroke ([place])    a. L PCA CVA in 05/2014.   Syncope    Weakness    left side S/P stroke   Past Surgical History:  Procedure Laterality Date   BLADDER SURGERY     CARDIAC CATHETERIZATION     CATARACT EXTRACTION W/PHACO Left 03/21/2021   Procedure: CATARACT EXTRACTION PHACO AND INTRAOCULAR LENS PLACEMENT ([place]) left 5.86 00:38.0;  Surgeon: Birder Robson, MD;  Location: [facility];  Service: Ophthalmology;   Laterality: Left;  Latex   CESAREAN SECTION     DENTAL SURGERY     Heart stent     stint      Allergies  Allergies  Allergen Reactions   Latex Rash   Penicillin G Itching   Atorvastatin Other (See Comments)    Myalgias    Other Hives    Adhesive tape   Amlodipine Swelling    Leg swelling   Doxycycline     Blood in urine    History of Present Illness    Leslie Houston has a past medical history of: HFpEF Echo 08/22/2014: EF 55 to 60%.  Grade II DD.  Mild to moderately dilated left atrium. Echo 07/03/2019: EF 50 to 55%.  Grade II DD.  Mildly dilated left atrium.  Mild mitral valve regurgitation. CAD. Patient reports coronary stent placed in 2000 at an outside hospital.  Details are unclear. Carotid stenosis. CT a head April 2016: 50 to 60% stenosis of proximal ICA bilaterally. Hypertension. Hyperlipidemia. CKD stage 4. CVA. Anemia of chronic disease.  Patient was last seen in the office on 02/14/2021 by Dr. Rockey Situ.  She was stable that visit and scheduled to follow-up in 1 year.  Today, patient is accompanied by health aide.  She she is here for cardiac  clearance for urologic procedure.  Health aide requested the surgery not be mentioned to the patient secondary to her getting upset about it. Note reviewed from urology indicates patient has persistent hydronephrosis with urinary retention.  The plan is to perform a cystoscopy with pelvic ultrasound and SP tube placement under anesthesia. Patient denies chest pain, shortness of breath, DOE, or unusual lower extremity edema.  She does have chronic right ankle edema and swelling of the right knee.  She stays busy doing chores around the house.  She is able to do moderate housework including vacuuming and laundry.  She also gets outside to tending to her garden.  She is independent with her personal hygiene.    Patient was seen in the ER at the beginning of this month for upper respiratory symptoms. Those symptoms have since resolved  other than an occasional cough.  According to the aide she had a seizure in the hospital.  She followed up with outpatient neurology and Keppra dose was increased.  She denies seizures since that time.      Home Medications    Current Meds  Medication Sig   ALPRAZolam (XANAX) 1 MG tablet Take 1-2 tablets 30 minutes prior to MRI, may repeat once as needed. Must have driver.   ASPIRIN LOW DOSE 81 MG EC tablet TAKE 1 TABLET BY MOUTH ONCE DAILY *SWALLOW WHOLE*   benzonatate (TESSALON PERLES) 100 MG capsule Take 1 capsule (100 mg total) by mouth 3 (three) times daily as needed for cough.   citalopram (CELEXA) 20 MG tablet Take 1 tablet (20 mg total) by mouth daily.   fluticasone (FLONASE) 50 MCG/ACT nasal spray Place 2 sprays into both nostrils daily as needed for allergies (allergies).    hydrALAZINE (APRESOLINE) 10 MG tablet TAKE 1 TABLET BY MOUTH TWICE A DAY (Patient taking differently: Take 10 mg by mouth daily.)   levETIRAcetam (KEPPRA) 1000 MG tablet Take 1 tablet (1,000 mg total) by mouth 2 (two) times daily.   loratadine (CLARITIN) 10 MG tablet TAKE 1 TABLET BY MOUTH ONCE A DAY AS NEEDED FOR ALLERGIES   pantoprazole (PROTONIX) 20 MG tablet TAKE 1 TABLET BY MOUTH ONCE A DAY   tamsulosin (FLOMAX) 0.4 MG CAPS capsule Take 2 capsules (0.8 mg total) by mouth daily. Take one in the morning and one at night. (Patient taking differently: Take 0.4 mg by mouth 2 (two) times daily. Take one in the morning and one at night.)   torsemide (DEMADEX) 20 MG tablet TAKE 1 TABLET BY MOUTH ONCE DAILY    Family History    Family History  Problem Relation Age of Onset   Stroke Mother    Heart attack Mother    Lung cancer Mother    Stroke Father    Prostate cancer Father    Colon cancer Father    Diabetes Mellitus II Daughter    Multiple sclerosis Daughter    Kidney disease Son    Heart failure Son    Cancer Paternal Grandfather    She indicated that her mother is deceased. She indicated that her  father is deceased. She indicated that the status of her paternal grandfather is unknown. She indicated that her son is deceased.   Social History    Social History   Socioeconomic History   Marital status: Single    Spouse name: Not on file   Number of children: 2   Years of education: 12   Highest education level: Not on file  Occupational History  Occupation: Disabled  Tobacco Use   Smoking status: Former    Types: Cigarettes    Quit date: 03/30/2014    Years since quitting: 7.9   Smokeless tobacco: Never  Vaping Use   Vaping Use: Never used  Substance and Sexual Activity   Alcohol use: No   Drug use: No   Sexual activity: Not Currently  Other Topics Concern   Not on file  Social History Narrative   Lives at home with her son's father.   Right-handed.   1 cup coffee per day.   Social Determinants of Health   Financial Resource Strain: Medium Risk (10/25/2021)   Overall Financial Resource Strain (CARDIA)    Difficulty of Paying Living Expenses: Somewhat hard  Food Insecurity: Food Insecurity Present (10/25/2021)   Hunger Vital Sign    Worried About Running Out of Food in the Last Year: Often true    Ran Out of Food in the Last Year: Often true  Transportation Needs: No Transportation Needs (10/25/2021)   PRAPARE - Hydrologist (Medical): No    Lack of Transportation (Non-Medical): No  Physical Activity: Insufficiently Active (10/10/2021)   Exercise Vital Sign    Days of Exercise per Week: 3 days    Minutes of Exercise per Session: 20 min  Stress: No Stress Concern Present (10/10/2021)   [place]    Feeling of Stress : Only a little  Social Connections: Moderately Isolated (10/10/2021)   Social Connection and Isolation Panel [NHANES]    Frequency of Communication with Friends and Family: More than three times a week    Frequency of Social Gatherings with Friends and  Family: Three times a week    Attends Religious Services: More than 4 times per year    Active Member of Clubs or Organizations: No    Attends Archivist Meetings: Never    Marital Status: Divorced  Human resources officer Violence: Not At Risk (10/10/2021)   Humiliation, Afraid, Rape, and Kick questionnaire    Fear of Current or Ex-Partner: No    Emotionally Abused: No    Physically Abused: No    Sexually Abused: No     Review of Systems    General: No chills, fever, night sweats or weight changes.  Cardiovascular:  No chest pain, dyspnea on exertion, edema, orthopnea, palpitations, paroxysmal nocturnal dyspnea. Dermatological: No rash, lesions/masses Respiratory: No cough, dyspnea Urologic: No hematuria, dysuria Abdominal:   No nausea, vomiting, diarrhea, bright red blood per rectum, melena, or hematemesis Musculoskeletal: Right knee swelling Neurologic:  No visual changes, weakness, changes in mental status. All other systems reviewed and are otherwise negative except as noted above.  Physical Exam    VS:  BP 126/70 (BP Location: Left Arm, Patient Position: Sitting, Cuff Size: Large)   Pulse (!) 50   Ht '5\' 1"'$  (1.549 m)   Wt 192 lb (87.1 kg)   SpO2 98%   BMI 36.28 kg/m  , BMI Body mass index is 36.28 kg/m. GEN:  Well nourished, well developed, in no acute distress. HEENT: Normal. Neck: Supple, no JVD, carotid bruits, or masses. Cardiac: RRR, no murmurs, rubs, or gallops. No clubbing, cyanosis.  Radials/DP/PT 2+ and equal bilaterally.  Trace right ankle edema. Respiratory:  Respirations regular and unlabored, clear to auscultation bilaterally. GI: Soft, nontender, nondistended. MS: No deformity or atrophy.  Right knee swelling Skin: Warm and dry, no rash. Neuro: Strength and sensation are intact. Psych:  Normal affect.  Accessory Clinical Findings     Recent Labs: 01/31/2022: ALT 11; BUN 39; Creatinine, Ser 2.64; Hemoglobin 11.7; Platelets 230; Potassium 3.6; Sodium  136   Recent Lipid Panel    Component Value Date/Time   CHOL 181 12/05/2021 1358   TRIG 116 12/05/2021 1358   HDL 73 12/05/2021 1358   CHOLHDL 2.5 12/05/2021 1358   VLDL 9 11/19/2016 1141   LDLCALC 87 12/05/2021 1358         ECG personally reviewed by me today sinus bradycardia with sinus arrhythmia, first-degree AV block.  Nonspecific T wave changes.  Heart rate 50.  Unchanged from unchanged from 02/01/2022.    Assessment & Plan   Chronic diastolic heart failure.  07/03/2019 showed EF of 50 to 55%.  Denies shortness of breath, DOE, or unusual lower extremity edema.  She is independent with personal hygiene.  She is able to complete housework including laundry and vacuuming.  She works outside in her garden.  She will continue torsemide 20 mg daily. CKD stage IV.  She is followed by nephrology.  She is also preparing for a urological procedure secondary to persistent hydronephrosis and urinary retention. CAD.  There is limited information for this diagnosis.  Patient reported cardiac stent placed in 2000.  She denies chest pain or DOE. Hypertension.  BP today 126/70.  Continue hydralazine 10 mg once a day. Hyperlipidemia.  Last lipid panel 12/05/2021: LDL 87, HDL 73, triglycerides 116, total cholesterol 181.  She is not at goal.  She is intolerant to atorvastatin.  Continue Zetia 10 mg daily. Preoperative clearance. The patient does not have any unstable cardiac conditions.  Upon evaluation today, she can achieve 4 METs or greater without anginal symptoms.  According to [facility] and AHA guidelines, she requires no further cardiac workup prior to her noncardiac surgery and should be at acceptable risk.  Our service is available as necessary in the perioperative period.    Disposition: Return in 6 months or sooner as needed.   Justice Britain. Nastassia Bazaldua, NP-C     02/23/2022, 4:16 PM [facility] [address] Office [phone] Fax [phone]   I  spent 10 minutes examining this patient, reviewing medications, and using patient centered shared decision making involving her cardiac care.  Prior to her visit I spent greater than 20 minutes reviewing her past medical history,  medications, and prior cardiac tests.

## 2022-02-23 NOTE — Patient Instructions (Signed)
Medication Instructions:   Your physician recommends that you continue on your current medications as directed. Please refer to the Current Medication list given to you today.  *If you need a refill on your cardiac medications before your next appointment, please call your pharmacy*   Lab Work:  None Ordered  If you have labs (blood work) drawn today and your tests are completely normal, you will receive your results only by: [place] (if you have MyChart) OR A paper copy in the mail If you have any lab test that is abnormal or we need to change your treatment, we will call you to review the results.   Testing/Procedures:  None Ordered    Follow-Up: At [facility], you and your health needs are our priority.  As part of our continuing mission to provide you with exceptional heart care, we have created designated Provider Care Teams.  These Care Teams include your primary Cardiologist (physician) and Advanced Practice Providers (APPs -  Physician Assistants and Nurse Practitioners) who all work together to provide you with the care you need, when you need it.  We recommend signing up for the patient portal called "MyChart".  Sign up information is provided on this After Visit Summary.  MyChart is used to connect with patients for Virtual Visits (Telemedicine).  Patients are able to view lab/test results, encounter notes, upcoming appointments, etc.  Non-urgent messages can be sent to your provider as well.   To learn more about what you can do with MyChart, go to [email or web address].    Your next appointment:   6 month(s)  The format for your next appointment:   In Person  Provider:   You may see Ida Rogue, MD or one of the following Advanced Practice Providers on your designated Care Team:   Murray Hodgkins, NP Christell Faith, PA-C Cadence Kathlen Mody, PA-C Gerrie Nordmann, NP

## 2022-02-28 ENCOUNTER — Encounter: Payer: Self-pay | Admitting: Urology

## 2022-02-28 ENCOUNTER — Ambulatory Visit (INDEPENDENT_AMBULATORY_CARE_PROVIDER_SITE_OTHER): Payer: Medicare Other | Admitting: Urology

## 2022-02-28 VITALS — BP 134/80 | HR 56 | Ht 61.0 in | Wt 190.4 lb

## 2022-02-28 DIAGNOSIS — I2510 Atherosclerotic heart disease of native coronary artery without angina pectoris: Secondary | ICD-10-CM

## 2022-02-28 DIAGNOSIS — N1330 Unspecified hydronephrosis: Secondary | ICD-10-CM | POA: Diagnosis not present

## 2022-02-28 DIAGNOSIS — R339 Retention of urine, unspecified: Secondary | ICD-10-CM

## 2022-02-28 DIAGNOSIS — N319 Neuromuscular dysfunction of bladder, unspecified: Secondary | ICD-10-CM

## 2022-02-28 LAB — URINALYSIS, COMPLETE
Bilirubin, UA: NEGATIVE
Glucose, UA: NEGATIVE
Ketones, UA: NEGATIVE
Nitrite, UA: NEGATIVE
Specific Gravity, UA: 1.015 (ref 1.005–1.030)
Urobilinogen, Ur: 0.2 mg/dL (ref 0.2–1.0)
pH, UA: 5.5 (ref 5.0–7.5)

## 2022-02-28 LAB — MICROSCOPIC EXAMINATION: WBC, UA: >30 /hpf — AB (ref 0–5)
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
## 2022-03-01 ENCOUNTER — Other Ambulatory Visit: Payer: Medicare Other

## 2022-03-01 ENCOUNTER — Other Ambulatory Visit: Payer: Self-pay

## 2022-03-01 DIAGNOSIS — R339 Retention of urine, unspecified: Secondary | ICD-10-CM

## 2022-03-01 DIAGNOSIS — N1330 Unspecified hydronephrosis: Secondary | ICD-10-CM

## 2022-03-01 NOTE — Progress Notes (Signed)
.  [facility] Posting Form    Surgery Date/Time: Date: 03/12/2022   Surgeon: Dr. [name], MD   Surgery Location: Day Surgery   Inpt ( No  )   Outpt (Yes)   Obs ( No  )    Diagnosis: Bilateral Hydronephrosis N13.30, Urinary Retention R33.9   -CPT: 52000, 51102, 57410   Surgery: Cystoscopy, Pelvic Exam under Anesthesia, Suprapubic Tube Placement    Stop Anticoagulations: Yes, will need to hold ASA   Cardiac/Medical/Pulmonary Clearance needed: yes   Clearance needed from Dr: Rockey Situ   Clearance request sent on: Date: Clearance obtained on 02/23/2022 with [facility], deemed acceptable Risk.   *Orders entered into EPIC  Date: 02/28/2022   *Case booked in EPIC  Date: 02/28/2022   *Notified pt of Surgery: Date: 02/28/2022   PRE-OP UA & CX: yes, will obtain in clinic on 02/28/2022   *Placed into Prior Authorization Work [name] Date: 02/28/2022     Assistant/laser/rep:No

## 2022-03-04 LAB — CULTURE, URINE COMPREHENSIVE

## 2022-03-07 ENCOUNTER — Telehealth: Payer: Self-pay | Admitting: Internal Medicine

## 2022-03-07 ENCOUNTER — Encounter
Admission: RE | Admit: 2022-03-07 | Discharge: 2022-03-07 | Disposition: A | Discharge status: Home / Self Care | Payer: Medicare Other | Source: Ambulatory Visit | Attending: Urology | Admitting: Urology

## 2022-03-07 NOTE — Patient Instructions (Addendum)
Your procedure is scheduled on: Monday March 12, 2022. Report to Day Surgery inside [facility] 2nd floor, stop at registration desk before getting on elevator. To find out your arrival time please call [phone] between 1PM - 3PM on Friday March 09, 2022.  Remember: Instructions that are not followed completely may result in serious medical risk,  up to and including death, or upon the discretion of your surgeon and anesthesiologist your  surgery may need to be rescheduled.     _X__ 1. Do not eat food after midnight the night before your procedure.                 No chewing gum or hard candies.   __X__2.  On the morning of surgery brush your teeth with toothpaste and water, you                may rinse your mouth with mouthwash if you wish.  Do not swallow any toothpaste or mouthwash.     _X__ 3.  No Alcohol for 24 hours before or after surgery.   _X__ 4.  Do Not Smoke or use e-cigarettes For 24 Hours Prior to Your Surgery.                 Do not use any chewable tobacco products for at least 6 hours prior to                 Surgery.  _X__  5.  Do not use any recreational drugs (marijuana, cocaine, heroin, ecstasy, MDMA or other)                For at least one week prior to your surgery.  Combination of these drugs with anesthesia                May have life threatening results.  ____  6.  Bring all medications with you on the day of surgery if instructed.   __X__  7.  Notify your doctor if there is any change in your medical condition      (cold, fever, infections).     Do not wear jewelry, make-up, hairpins, clips or nail polish. Do not wear lotions, powders, or perfumes. You may wear deodorant. Do not shave 48 hours prior to surgery. Men may shave face and neck. Do not bring valuables to the hospital.    [facility] is not responsible for any belongings or valuables.  Contacts, dentures or bridgework may not be worn into surgery. Leave  your suitcase in the car. After surgery it may be brought to your room. For patients admitted to the hospital, discharge time is determined by your treatment team.   Patients discharged the day of surgery will not be allowed to drive home.   Make arrangements for someone to be with you for the first 24 hours of your Same Day Discharge.   ___X_ Take these medicines the morning of surgery with A SIP OF WATER:    1. citalopram (CELEXA) 20 MG   2. hydrALAZINE (APRESOLINE) 10 MG   3. levETIRAcetam (KEPPRA) 1000 MG   4. pantoprazole (PROTONIX) 20 MG   5. tamsulosin (FLOMAX) 0.4 MG   6.  ____ Fleet Enema (as directed)   __X__ Use CHG Soap (or wipes) as directed  ____ Use Benzoyl Peroxide Gel as instructed  ____ Use inhalers on the day of surgery  ____ Stop metformin 2 days prior to surgery    ____ Take 1/2  of usual insulin dose the night before surgery. No insulin the morning          of surgery.   __X__ Stop aspirin 81 mg 5 days before your procedure as instructed by your doctor.   __X__ One Week prior to surgery- Stop Anti-inflammatories such as Ibuprofen, Aleve, Advil, Motrin, meloxicam (MOBIC), diclofenac, etodolac, ketorolac, Toradol, Daypro, piroxicam, Goody's or BC powders. OK TO USE TYLENOL IF NEEDED   __X__ Stop supplements until after surgery.    ____ Bring C-Pap to the hospital.    If you have any questions regarding your pre-procedure instructions,  Please call Pre-admit Testing at [phone]

## 2022-03-07 NOTE — Telephone Encounter (Signed)
Copied from [place] [phone]. Topic: General - Other >> Mar 07, 2022 11:45 AM Chapman Fitch wrote: Reason for CRM: Pt is having surgery on Monday and her care giver Leslie Houston is asking if Dr. Rosana Berger can place an order for someone to go by the home and check on the wound and assist the pt due to her being handicap / pts daughter has MS and is in the hospital / please advise

## 2022-03-07 NOTE — Telephone Encounter (Signed)
Called she is having some type of catheter placement and needs home health to come in and check on her and do dressing changes, they called specialist who was doing surgery and stated it would need to come from PCP? So can we place referral to home health? Surgery is monday

## 2022-03-08 ENCOUNTER — Encounter: Payer: Self-pay | Admitting: Urology

## 2022-03-08 ENCOUNTER — Ambulatory Visit: Payer: Medicare Other | Admitting: Internal Medicine

## 2022-03-08 NOTE — Progress Notes (Signed)
Perioperative Services  Pre-Admission/Anesthesia Testing Clinical Review  Date: 03/08/22  Patient Demographics:  Name: Leslie Houston DOB:   16-Feb-1951 MRN:   [id number]  Planned Surgical Procedure(s):    Case: [id number] Date/Time: 03/12/22 1042   Procedures:      CYSTOSCOPY     INSERTION OF SUPRAPUBIC CATHETER     PELVIC EXAM UNDER ANESTHESIA   Anesthesia type: General   Pre-op diagnosis: Urinary Retention, Bilateral Hydronephrosis   Location: [facility] OR ROOM 10 / [facility]   Surgeons: [name], MD   NOTE: Available PAT nursing documentation and vital signs have been reviewed. Clinical nursing staff has updated patient's PMH/PSHx, current medication list, and drug allergies/intolerances to ensure comprehensive history available to assist in medical decision making as it pertains to the aforementioned surgical procedure and anticipated anesthetic course. Extensive review of available clinical information performed. [place] PMH and PSHx updated with any diagnoses/procedures that  may have been inadvertently omitted during her intake with the pre-admission testing department's nursing staff.  Clinical Discussion:  Leslie Houston is a 71 y.o. female who is submitted for pre-surgical anesthesia review and clearance prior to her undergoing the above procedure. Patient is a Former Research scientist (life sciences). 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   Patient is followed by cardiology Rockey Situ, MD). She was last seen in the cardiology clinic on 02/23/2022; notes reviewed.  At the time of her clinic visit, patient doing well overall from a cardiovascular perspective.  She denied any episodes of chest pain, shortness of breath, PND, orthopnea, palpitations,  vertiginous symptoms, or presyncope/syncope.  Patient with chronic peripheral edema which was reported to be stable and at baseline.  Patient with a past medical history significant for cardiovascular diagnoses.  Patient suffered an acute LEFT PCA stroke on 06/22/2014.  Patient with residual RIGHT-sided weakness and visual disturbance following neurological event.  Most recent TTE was performed on 07/03/2019 revealing a low normal left ventricular systolic function with an EF of 50-55%.  There were no regional wall motion abnormalities. Left ventricular diastolic Doppler parameters consistent with pseudonormalization (G2DD).  Right ventricular size and function was normal.  Left atrium was mildly dilated.  Mitral valve degeneration with associated regurgitation noted.  There was no evidence of significant transvalvular gradients to suggest stenosis.  Blood pressure well controlled at 126/70 mmHg on currently prescribed vasodilator (hydralazine) and diuretic (torsemide) therapies.  Patient is on ezetimibe for her HLD diagnosis and ASCVD prevention.  T2DM well-controlled with diet lifestyle modification alone; last HgbA1c was 4.7 when checked on 12/05/2021.  Patient does have an OSAH diagnosis, however she does not currently use nocturnal PAP therapy.  Patient presents with caregiver who reports that patient has old (nonfunctioning) DME in place.  Patient has been referred by her neurologist for repeat PSG testing.  Patient able to complete ADLs independently, however due to intellectual disability, she requires assistance with higher functioning tasks. Functional capacity, as defined by DASI, is documented as being >/= 4 METS.  No changes were made to patient's medication regimen during her visit with cardiology.  Patient follow-up with outpatient cardiology team in 6 months or sooner if needed.  Patient is also followed by neurology Krista Blue, MD).  Patient was last seen in the neurology clinic on 02/15/2022;  notes reviewed.  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  Patient with gait abnormality.  Patient recently suffered a recurrent seizure while taking low-dose Keppra on 02/01/2022.  Patient being sent for repeat EEG and Keppra dose being increased.  As previously mentioned, patient being referred for repeat PSG as last sleep study was 10 years ago and patient has old/nonfunctioning DME.  Beyond the aforementioned, no other changes were made to her medication regimen.  Patient to follow up with outpatient neurology team on a regular basis for ongoing care of her neurological conditions.  Leslie Houston is scheduled for an CYSTOSCOPY; INSERTION OF SUPRAPUBIC CATHETER; PELVIC EXAM UNDER ANESTHESIA on 03/12/2022 with Dr. [name], MD.  Given patient's past medical history significant for cardiovascular and neurological diagnoses, presurgical clearances from both cardiology and neurology were sought by the PAT team.  Specialty clearances were obtained as follows.   Per cardiology, "the patient does not have any unstable cardiac conditions.  Upon evaluation today, she can achieve 4 METs or greater without anginal symptoms.  According to [facility] and AHA guidelines, she requires no further cardiac workup prior to her noncardiac surgery and should be at an ACCEPTABLE risk. Our service is available as necessary in the perioperative period".    Per neurology, "I see no contraindication for her elective urology procedure, she had recurrent seizure on relatively low-dose of Keppra, I have increased her Keppra to 1000 mg twice a day. If surgery requires prolonged NPO, may consider bridging her with IV Keppra of equivalent dose".  In review of her medication reconciliation, it is noted that patient is currently on prescribed daily antiplatelet therapy. She has been instructed on recommendations for holding her daily low-dose ASA for 5 days prior to her procedure with  plans to restart as soon as postoperative bleeding risk felt to be minimized by her attending surgeon. The patient has been instructed that her last dose of her ASA will be on 03/06/2022.  Patient reports previous perioperative complications with anesthesia in the past. Patient has a PMH (+) for PONV. Symptoms and history of PONV will be discussed with patient by anesthesia team on the day of her procedure. Interventions will be ordered as deemed necessary based on patient's individual care needs as determined by anesthesiologist. In review of the available records, it is noted that patient underwent a MAC anesthetic course at [facility] ([facility]) in 02/2021 without documented complications.      03/07/2022    2:44 PM 02/28/2022   10:39 AM 02/23/2022    2:55 PM  Vitals with BMI  Height _0  _1  _2   Weight 188 lbs 190 lbs 6 oz 192 lbs  BMI 35.54 24.40 10.2  Systolic  725 366  Diastolic  80 70  Pulse  56 50    Providers/Specialists:   NOTE: Primary physician provider listed below. Patient may have been seen by APP or partner within same practice.   PROVIDER ROLE / SPECIALTY LAST Lu Duffel, MD Urology (Surgeon) 02/28/2022  Teodora Medici, DO Primary Care Provider 12/05/2021  Ida Rogue, MD Cardiology 02/23/2022  Marcial Pacas, MD Neurology 02/15/2022   Allergies:  Latex, Penicillin g, Atorvastatin, Other, Amlodipine, and Doxycycline  Current Home Medications:   No current facility-administered medications for this encounter.    ALPRAZolam (XANAX) 1 MG tablet   ASPIRIN LOW DOSE 81 MG EC tablet   benzonatate (TESSALON PERLES) 100 MG capsule   Cholecalciferol (VITAMIN D3) 25 MCG (1000 UT) CAPS   citalopram (  CELEXA) 20 MG tablet   ezetimibe (ZETIA) 10 MG tablet   ferrous sulfate 325 (65 FE) MG tablet   fluticasone (FLONASE) 50 MCG/ACT nasal spray   hydrALAZINE (APRESOLINE) 10 MG tablet   levETIRAcetam (KEPPRA) 1000 MG tablet   loratadine (CLARITIN)  10 MG tablet   pantoprazole (PROTONIX) 20 MG tablet   tamsulosin (FLOMAX) 0.4 MG CAPS capsule   torsemide (DEMADEX) 20 MG tablet   Vitamin D, Ergocalciferol, (DRISDOL) 1.25 MG (50000 UNIT) CAPS capsule   History:   Past Medical History:  Diagnosis Date   (HFpEF) heart failure with preserved ejection fraction ([place])    a. Pt reports in 2000 she had CHF, details unclear, outside hospital; b. 06/2019 Echo: EF 50-55%, no rwma, Gr2 DD,  mildly dil LA, mild MR.   Acute left PCA stroke (HCC) 06/22/2014   Anxiety    a.) on BZO (alprazolam) PRN   CAD (coronary artery disease)    a. Pt reports in 2000 she had a stent to a coronary artery, details unclear, outside hospital.   Carotid stenosis    a. CT angio head 07/2014 - 50-60% stenoses of prox ICA bilaterally.   CHF (congestive heart failure) (HCC)    CKD (chronic kidney disease), stage IV ([place])    Diabetes mellitus ([place])    Denies.   Edentulous    No upper teeth   Family hx of colon cancer 06/13/2018   Father diagnosed in his 41's   Gout    HOH (hard of hearing)    usually needs to read lips   Hyperlipidemia    Hypertension    Left-sided weakness 05/2014   a.) s/p CVA   Orthostatic hypotension    Osteopenia 01/24/2018   DEXA Sept 2019   PONV (postoperative nausea and vomiting)    PTSD (post-traumatic stress disorder)        PUD (peptic ulcer disease)    PVC's (premature ventricular contractions)    Seizures (HCC)    Sinus bradycardia    Syncope    Past Surgical History:  Procedure Laterality Date   BLADDER SURGERY     CARDIAC CATHETERIZATION     CATARACT EXTRACTION W/PHACO Left 03/21/2021   Procedure: CATARACT EXTRACTION PHACO AND INTRAOCULAR LENS PLACEMENT ([place]) left 5.86 00:38.0;  Surgeon: Birder Robson, MD;  Location: [facility];  Service: Ophthalmology;  Laterality: Left;  Latex   CESAREAN SECTION     DENTAL SURGERY     Heart stent     stint     Family History  Problem Relation Age of Onset   Stroke  Mother    Heart attack Mother    Lung cancer Mother    Stroke Father    Prostate cancer Father    Colon cancer Father    Diabetes Mellitus II Daughter    Multiple sclerosis Daughter    Kidney disease Son    Heart failure Son    Cancer Paternal Grandfather    Social History   Tobacco Use   Smoking status: Former    Types: Cigarettes    Quit date: 03/30/2014    Years since quitting: 7.9   Smokeless tobacco: Never  Vaping Use   Vaping Use: Never used  Substance Use Topics   Alcohol use: No   Drug use: No    Pertinent Clinical Results:  LABS: Labs reviewed: Acceptable for surgery.  Lab Results  Component Value Date   WBC 5.1 01/31/2022   HGB 11.7 (L) 01/31/2022   HCT 36.9  01/31/2022   MCV 101.7 (H) 01/31/2022   PLT 230 01/31/2022   Lab Results  Component Value Date   NA 136 01/31/2022   K 3.6 01/31/2022   CO2 22 01/31/2022   GLUCOSE 98 01/31/2022   BUN 39 (H) 01/31/2022   CREATININE 2.64 (H) 01/31/2022   CALCIUM 9.1 01/31/2022   EGFR 17 (L) 01/18/2022   GFRNONAA 19 (L) 01/31/2022   Component Date Value Ref Range Status   Urine Culture, Comprehensive 02/28/2022 Final report   Final   Organism ID, Bacteria 02/28/2022 Comment   Final   Comment: Mixed urogenital flora 25,000-50,000 colony forming units per mL    Specific Gravity, UA 02/28/2022 1.015  1.005 - 1.030 Final   pH, UA 02/28/2022 5.5  5.0 - 7.5 Final   Color, UA 02/28/2022 Yellow  Yellow Final   Appearance Ur 02/28/2022 Cloudy (A)  Clear Final   Leukocytes,UA 02/28/2022 3+ (A)  Negative Final   Protein,UA 02/28/2022 2+ (A)  Negative/Trace Final   Glucose, UA 02/28/2022 Negative  Negative Final   Ketones, UA 02/28/2022 Negative  Negative Final   RBC, UA 02/28/2022 1+ (A)  Negative Final   Bilirubin, UA 02/28/2022 Negative  Negative Final   Urobilinogen, Ur 02/28/2022 0.2  0.2 - 1.0 mg/dL Final   Nitrite, UA 02/28/2022 Negative  Negative Final   Microscopic Examination 02/28/2022 See below:   Final    WBC, UA 02/28/2022 >30 (A)  0 - 5 /hpf Final   RBC, Urine 02/28/2022 3-10 (A)  0 - 2 /hpf Final   Epithelial Cells (non renal) 02/28/2022 0-10  0 - 10 /hpf Final   Bacteria, UA 02/28/2022 Moderate (A)  None seen/Few Final   Yeast, UA 02/28/2022 Present (A)  None seen Final    ECG: Date: 02/23/2022 Time ECG obtained: 1455 PM Rate: 50 bpm Rhythm:  Sinus bradycardia with sinus arrhythmia with first-degree AV block Axis (leads I and aVF): Normal Intervals: PR 240 ms. QRS 80 ms. QTc 435 ms. ST segment and T wave changes: Inferolateral T wave abnormalities  Comparison: Similar to previous tracing obtained on 01/31/2022   IMAGING / PROCEDURES: US RENAL performed on 02/14/2022 Severe hydronephrosis remains on the right, stable. Severe hydronephrosis remains on the left, stable. Large postvoid residual of 368 cc. Dependent debris within the bladder. Small bladder diverticulum.  DIAGNOSTIC RADIOGRAPHS OF CHEST 2 VIEWS performed on 01/31/2022 Stable cardiomediastinal silhouette.  Both lungs are clear.  The visualized skeletal structures are unremarkable. No active cardiopulmonary disease.   TRANSTHORACIC ECHOCARDIOGRAM performed on 07/03/2019 Left ventricular ejection fraction, by estimation, is 50 to 55%. The left ventricle has low normal function. The left ventricle has no regional wall motion abnormalities. Left ventricular diastolic parameters are consistent with Grade II diastolic dysfunction (pseudonormalization).  Right ventricular systolic function is normal. The right ventricular size is normal. Tricuspid regurgitation signal is inadequate for assessing PA pressure.  Left atrial size was mildly dilated.  The mitral valve is degenerative. Mild mitral valve regurgitation. No evidence of mitral stenosis.  The aortic valve is tricuspid. Aortic valve regurgitation is not visualized. No aortic stenosis is present.  The inferior vena cava is normal in size with <50% respiratory  variability, suggesting right atrial pressure of 8 mmHg.   CT HEAD WO CONTRAST performed on 01/17/2019 Old infarct within the LEFT occipital lobe with associated encephalomalacia.  Mild chronic small vessel ischemic changes within the deep periventricular white matter regions bilaterally. No mass, hemorrhage, edema or other evidence of acute parenchymal  abnormality. No extra-axial hemorrhage. Chronic calcified atherosclerotic changes of the large vessels at the skull base. No unexpected hyperdense vessel. No acute findings.  Impression and Plan:  Vlasta Baskin has been referred for pre-anesthesia review and clearance prior to her undergoing the planned anesthetic and procedural courses. Available labs, pertinent testing, and imaging results were personally reviewed by me. This patient has been appropriately cleared by cardiology (ACCEPTABLE) and by neurology (ACCEPTABLE) with the individually indicated risk of significant perioperative complications.  Based on clinical review performed today (03/08/22), barring any significant acute changes in the patient's overall condition, it is anticipated that she will be able to proceed with the planned surgical intervention. Any acute changes in clinical condition may necessitate her procedure being postponed and/or cancelled. Patient will meet with anesthesia team (MD and/or CRNA) on the day of her procedure for preoperative evaluation/assessment. Questions regarding anesthetic course will be fielded at that time.   Pre-surgical instructions were reviewed with the patient during her PAT appointment and questions were fielded by PAT clinical staff. Patient was advised that if any questions or concerns arise prior to her procedure then she should return a call to PAT and/or her surgeon's office to discuss.  Honor Loh, MSN, APRN, FNP-C, CEN [facility]  Peri-operative Services Nurse Practitioner Phone: [phone] Fax: [phone] 03/08/22 8:42 AM  NOTE: This note has been prepared using Dragon dictation software. Despite my best ability to proofread, there is always the potential that unintentional transcriptional errors may still occur from this process.

## 2022-03-11 MED ORDER — SODIUM CHLORIDE 0.9 % IV SOLN: INTRAVENOUS | Status: DC

## 2022-03-11 MED ORDER — CEFAZOLIN SODIUM-DEXTROSE 2-4 GM/100ML-% IV SOLN
2.0000 g | INTRAVENOUS | Status: AC
  Administered 2022-03-12: 2 g via INTRAVENOUS

## 2022-03-11 MED ORDER — CHLORHEXIDINE GLUCONATE 0.12 % MT SOLN: 15.0000 mL | Freq: Once | OROMUCOSAL | Status: AC

## 2022-03-11 MED ORDER — ORAL CARE MOUTH RINSE: 15.0000 mL | Freq: Once | OROMUCOSAL | Status: AC

## 2022-03-12 ENCOUNTER — Ambulatory Visit: Payer: Medicare Other | Admitting: Urgent Care

## 2022-03-12 ENCOUNTER — Other Ambulatory Visit: Payer: Self-pay

## 2022-03-12 ENCOUNTER — Encounter: Payer: Self-pay | Admitting: Urology

## 2022-03-12 ENCOUNTER — Encounter
Admission: RE | Disposition: A | Discharge status: Home / Self Care | Payer: Self-pay | Source: Home / Self Care | Attending: Urology

## 2022-03-12 ENCOUNTER — Ambulatory Visit: Payer: Medicare Other

## 2022-03-12 ENCOUNTER — Ambulatory Visit
Admission: RE | Admit: 2022-03-12 | Discharge: 2022-03-12 | Disposition: A | Discharge status: Home / Self Care | Payer: Medicare Other | Source: Home / Self Care | Attending: Urology | Admitting: Urology

## 2022-03-12 ENCOUNTER — Other Ambulatory Visit: Payer: Self-pay | Admitting: Internal Medicine

## 2022-03-12 DIAGNOSIS — M8580 Other specified disorders of bone density and structure, unspecified site: Secondary | ICD-10-CM | POA: Insufficient documentation

## 2022-03-12 DIAGNOSIS — E1122 Type 2 diabetes mellitus with diabetic chronic kidney disease: Secondary | ICD-10-CM | POA: Insufficient documentation

## 2022-03-12 DIAGNOSIS — Y738 Miscellaneous gastroenterology and urology devices associated with adverse incidents, not elsewhere classified: Secondary | ICD-10-CM | POA: Diagnosis not present

## 2022-03-12 DIAGNOSIS — N1330 Unspecified hydronephrosis: Secondary | ICD-10-CM | POA: Diagnosis not present

## 2022-03-12 DIAGNOSIS — I120 Hypertensive chronic kidney disease with stage 5 chronic kidney disease or end stage renal disease: Secondary | ICD-10-CM | POA: Diagnosis not present

## 2022-03-12 DIAGNOSIS — Z1152 Encounter for screening for COVID-19: Secondary | ICD-10-CM | POA: Diagnosis not present

## 2022-03-12 DIAGNOSIS — K5669 Other partial intestinal obstruction: Secondary | ICD-10-CM | POA: Diagnosis not present

## 2022-03-12 DIAGNOSIS — E875 Hyperkalemia: Secondary | ICD-10-CM | POA: Diagnosis not present

## 2022-03-12 DIAGNOSIS — Z87891 Personal history of nicotine dependence: Secondary | ICD-10-CM | POA: Insufficient documentation

## 2022-03-12 DIAGNOSIS — N3289 Other specified disorders of bladder: Secondary | ICD-10-CM | POA: Diagnosis not present

## 2022-03-12 DIAGNOSIS — I10 Essential (primary) hypertension: Secondary | ICD-10-CM | POA: Diagnosis not present

## 2022-03-12 DIAGNOSIS — Y846 Urinary catheterization as the cause of abnormal reaction of the patient, or of later complication, without mention of misadventure at the time of the procedure: Secondary | ICD-10-CM | POA: Diagnosis not present

## 2022-03-12 DIAGNOSIS — R1084 Generalized abdominal pain: Secondary | ICD-10-CM | POA: Diagnosis not present

## 2022-03-12 DIAGNOSIS — Y838 Other surgical procedures as the cause of abnormal reaction of the patient, or of later complication, without mention of misadventure at the time of the procedure: Secondary | ICD-10-CM | POA: Diagnosis not present

## 2022-03-12 DIAGNOSIS — I959 Hypotension, unspecified: Secondary | ICD-10-CM | POA: Diagnosis not present

## 2022-03-12 DIAGNOSIS — E872 Acidosis, unspecified: Secondary | ICD-10-CM | POA: Diagnosis not present

## 2022-03-12 DIAGNOSIS — N136 Pyonephrosis: Secondary | ICD-10-CM | POA: Diagnosis not present

## 2022-03-12 DIAGNOSIS — N189 Chronic kidney disease, unspecified: Secondary | ICD-10-CM | POA: Diagnosis not present

## 2022-03-12 DIAGNOSIS — I69351 Hemiplegia and hemiparesis following cerebral infarction affecting right dominant side: Secondary | ICD-10-CM | POA: Diagnosis not present

## 2022-03-12 DIAGNOSIS — F32A Depression, unspecified: Secondary | ICD-10-CM | POA: Diagnosis not present

## 2022-03-12 DIAGNOSIS — I2510 Atherosclerotic heart disease of native coronary artery without angina pectoris: Secondary | ICD-10-CM | POA: Insufficient documentation

## 2022-03-12 DIAGNOSIS — R112 Nausea with vomiting, unspecified: Secondary | ICD-10-CM | POA: Diagnosis not present

## 2022-03-12 DIAGNOSIS — E785 Hyperlipidemia, unspecified: Secondary | ICD-10-CM | POA: Insufficient documentation

## 2022-03-12 DIAGNOSIS — N3592 Unspecified urethral stricture, female: Secondary | ICD-10-CM | POA: Diagnosis not present

## 2022-03-12 DIAGNOSIS — I130 Hypertensive heart and chronic kidney disease with heart failure and stage 1 through stage 4 chronic kidney disease, or unspecified chronic kidney disease: Secondary | ICD-10-CM | POA: Insufficient documentation

## 2022-03-12 DIAGNOSIS — K9189 Other postprocedural complications and disorders of digestive system: Secondary | ICD-10-CM | POA: Diagnosis not present

## 2022-03-12 DIAGNOSIS — R339 Retention of urine, unspecified: Secondary | ICD-10-CM | POA: Insufficient documentation

## 2022-03-12 DIAGNOSIS — Z6835 Body mass index (BMI) 35.0-35.9, adult: Secondary | ICD-10-CM | POA: Diagnosis not present

## 2022-03-12 DIAGNOSIS — G40909 Epilepsy, unspecified, not intractable, without status epilepticus: Secondary | ICD-10-CM | POA: Diagnosis not present

## 2022-03-12 DIAGNOSIS — K56600 Partial intestinal obstruction, unspecified as to cause: Secondary | ICD-10-CM | POA: Diagnosis not present

## 2022-03-12 DIAGNOSIS — E870 Hyperosmolality and hypernatremia: Secondary | ICD-10-CM | POA: Diagnosis not present

## 2022-03-12 DIAGNOSIS — N323 Diverticulum of bladder: Secondary | ICD-10-CM | POA: Insufficient documentation

## 2022-03-12 DIAGNOSIS — I5032 Chronic diastolic (congestive) heart failure: Secondary | ICD-10-CM | POA: Insufficient documentation

## 2022-03-12 DIAGNOSIS — I69954 Hemiplegia and hemiparesis following unspecified cerebrovascular disease affecting left non-dominant side: Secondary | ICD-10-CM | POA: Insufficient documentation

## 2022-03-12 DIAGNOSIS — K567 Ileus, unspecified: Secondary | ICD-10-CM | POA: Diagnosis not present

## 2022-03-12 DIAGNOSIS — N184 Chronic kidney disease, stage 4 (severe): Secondary | ICD-10-CM | POA: Insufficient documentation

## 2022-03-12 DIAGNOSIS — N390 Urinary tract infection, site not specified: Secondary | ICD-10-CM | POA: Diagnosis not present

## 2022-03-12 DIAGNOSIS — E669 Obesity, unspecified: Secondary | ICD-10-CM | POA: Diagnosis not present

## 2022-03-12 DIAGNOSIS — K219 Gastro-esophageal reflux disease without esophagitis: Secondary | ICD-10-CM | POA: Diagnosis not present

## 2022-03-12 DIAGNOSIS — E860 Dehydration: Secondary | ICD-10-CM | POA: Diagnosis not present

## 2022-03-12 DIAGNOSIS — I509 Heart failure, unspecified: Secondary | ICD-10-CM | POA: Diagnosis not present

## 2022-03-12 DIAGNOSIS — J9811 Atelectasis: Secondary | ICD-10-CM | POA: Diagnosis not present

## 2022-03-12 DIAGNOSIS — T83030A Leakage of cystostomy catheter, initial encounter: Secondary | ICD-10-CM | POA: Diagnosis not present

## 2022-03-12 DIAGNOSIS — I69354 Hemiplegia and hemiparesis following cerebral infarction affecting left non-dominant side: Secondary | ICD-10-CM | POA: Diagnosis not present

## 2022-03-12 HISTORY — DX: Unspecified visual disturbance: H53.9

## 2022-03-12 HISTORY — DX: Adverse effect of antihyperlipidemic and antiarteriosclerotic drugs, initial encounter: T46.6X5A

## 2022-03-12 HISTORY — PX: CYSTOSCOPY: SHX5120

## 2022-03-12 HISTORY — PX: INSERTION OF SUPRAPUBIC CATHETER: SHX5870

## 2022-03-12 HISTORY — DX: Anxiety disorder, unspecified: F41.9

## 2022-03-12 HISTORY — DX: Unspecified intellectual disabilities: F79

## 2022-03-12 HISTORY — DX: Obstructive sleep apnea (adult) (pediatric): G47.33

## 2022-03-12 HISTORY — PX: RECTAL EXAM UNDER ANESTHESIA: SHX6399

## 2022-03-12 HISTORY — DX: Depression, unspecified: F32.A

## 2022-03-12 HISTORY — DX: Myalgia, unspecified site: M79.10

## 2022-03-12 HISTORY — DX: Unspecified visual disturbance: I69.398

## 2022-03-12 LAB — GLUCOSE, CAPILLARY
Glucose-Capillary: 75 mg/dL (ref 70–99)
Glucose-Capillary: 110 mg/dL — ABNORMAL HIGH (ref 70–99)

## 2022-03-12 SURGERY — CYSTOSCOPY
Anesthesia: General

## 2022-03-12 MED ORDER — LIDOCAINE 1% INJECTION FOR CIRCUMCISION
INJECTION | INTRAVENOUS | Status: DC | PRN
  Administered 2022-03-12: 10 mL via SUBCUTANEOUS

## 2022-03-12 MED ORDER — FENTANYL CITRATE (PF) 100 MCG/2ML IJ SOLN
INTRAMUSCULAR | Status: DC | PRN
  Administered 2022-03-12 (×2): 25 ug via INTRAVENOUS

## 2022-03-12 MED ORDER — FENTANYL CITRATE (PF) 100 MCG/2ML IJ SOLN
INTRAMUSCULAR | Status: AC
  Filled 2022-03-12: qty 2

## 2022-03-12 MED ORDER — ACETAMINOPHEN 10 MG/ML IV SOLN
INTRAVENOUS | Status: AC
  Filled 2022-03-12: qty 100

## 2022-03-12 MED ORDER — PROPOFOL 10 MG/ML IV BOLUS
INTRAVENOUS | Status: DC | PRN
  Administered 2022-03-12 (×2): 30 mg via INTRAVENOUS
  Administered 2022-03-12: 120 mg via INTRAVENOUS

## 2022-03-12 MED ORDER — LIDOCAINE HCL (PF) 1 % IJ SOLN
INTRAMUSCULAR | Status: AC
  Filled 2022-03-12: qty 30

## 2022-03-12 MED ORDER — CEFAZOLIN SODIUM-DEXTROSE 2-4 GM/100ML-% IV SOLN
INTRAVENOUS | Status: AC
  Filled 2022-03-12: qty 100

## 2022-03-12 MED ORDER — DEXAMETHASONE SODIUM PHOSPHATE 10 MG/ML IJ SOLN
INTRAMUSCULAR | Status: DC | PRN
  Administered 2022-03-12: 10 mg via INTRAVENOUS

## 2022-03-12 MED ORDER — OXYCODONE HCL 5 MG PO TABS: 5.0000 mg | ORAL_TABLET | Freq: Once | ORAL | Status: DC | PRN

## 2022-03-12 MED ORDER — OXYCODONE HCL 5 MG/5ML PO SOLN: 5.0000 mg | Freq: Once | ORAL | Status: DC | PRN

## 2022-03-12 MED ORDER — PROPOFOL 1000 MG/100ML IV EMUL
INTRAVENOUS | Status: AC
  Filled 2022-03-12: qty 100

## 2022-03-12 MED ORDER — PHENYLEPHRINE HCL (PRESSORS) 10 MG/ML IV SOLN
INTRAVENOUS | Status: DC | PRN
  Administered 2022-03-12: 80 ug via INTRAVENOUS
  Administered 2022-03-12 (×2): 160 ug via INTRAVENOUS
  Administered 2022-03-12: 80 ug via INTRAVENOUS

## 2022-03-12 MED ORDER — ACETAMINOPHEN 10 MG/ML IV SOLN
INTRAVENOUS | Status: DC | PRN
  Administered 2022-03-12: 1000 mg via INTRAVENOUS

## 2022-03-12 MED ORDER — GLYCOPYRROLATE 0.2 MG/ML IJ SOLN
INTRAMUSCULAR | Status: DC | PRN
  Administered 2022-03-12: .2 mg via INTRAVENOUS

## 2022-03-12 MED ORDER — ONDANSETRON HCL 4 MG/2ML IJ SOLN
INTRAMUSCULAR | Status: DC | PRN
  Administered 2022-03-12: 4 mg via INTRAVENOUS

## 2022-03-12 MED ORDER — LIDOCAINE HCL (CARDIAC) PF 100 MG/5ML IV SOSY
PREFILLED_SYRINGE | INTRAVENOUS | Status: DC | PRN
  Administered 2022-03-12: 80 mg via INTRAVENOUS

## 2022-03-12 MED ORDER — SODIUM CHLORIDE 0.9 % IR SOLN
Status: DC | PRN
  Administered 2022-03-12: 1000 mL via INTRAVESICAL

## 2022-03-12 MED ORDER — HYDROMORPHONE HCL 1 MG/ML IJ SOLN: 0.2500 mg | INTRAMUSCULAR | Status: DC | PRN

## 2022-03-12 MED ORDER — CHLORHEXIDINE GLUCONATE 0.12 % MT SOLN
OROMUCOSAL | Status: AC
  Administered 2022-03-12: 15 mL via OROMUCOSAL
  Filled 2022-03-12: qty 15

## 2022-03-12 MED ORDER — EPHEDRINE SULFATE (PRESSORS) 50 MG/ML IJ SOLN
INTRAMUSCULAR | Status: DC | PRN
  Administered 2022-03-12: 10 mg via INTRAVENOUS
  Administered 2022-03-12 (×2): 5 mg via INTRAVENOUS

## 2022-03-12 SURGICAL SUPPLY — 36 items
BAG DRAIN SIEMENS DORNER NS (MISCELLANEOUS) ×2 IMPLANT
BAG DRN NS LF (MISCELLANEOUS) ×2
BAG DRN RND TRDRP ANRFLXCHMBR (UROLOGICAL SUPPLIES) ×2
BAG URINE DRAIN 2000ML AR STRL (UROLOGICAL SUPPLIES) IMPLANT
BLADE SURG 11 STRL SS SAFETY (MISCELLANEOUS) ×2 IMPLANT
BRUSH SCRUB EZ 1% IODOPHOR (MISCELLANEOUS) ×2 IMPLANT
CATH FOLEY 2WAY  5CC 16FR (CATHETERS)
CATH FOLEY 2WAY 5CC 16FR (CATHETERS)
CATH FOLEY 2WAY SIL 16X30 (CATHETERS) IMPLANT
CATH URETL OPEN 5X70 (CATHETERS) ×2 IMPLANT
CATH URTH 16FR FL 2W BLN LF (CATHETERS) ×2 IMPLANT
GAUZE 4X4 16PLY ~~LOC~~+RFID DBL (SPONGE) ×4 IMPLANT
GLOVE BIO SURGEON STRL SZ 6.5 (GLOVE) ×2 IMPLANT
GLOVE BIO SURGEON STRL SZ7 (GLOVE) ×2 IMPLANT
GOWN STRL REUS W/ TWL LRG LVL3 (GOWN DISPOSABLE) ×4 IMPLANT
GOWN STRL REUS W/TWL LRG LVL3 (GOWN DISPOSABLE) ×4
GUIDEWIRE STR DUAL SENSOR (WIRE) ×4 IMPLANT
IV NS IRRIG 3000ML ARTHROMATIC (IV SOLUTION) ×2 IMPLANT
MANIFOLD NEPTUNE II (INSTRUMENTS) ×2 IMPLANT
NDL HYPO 25X1 1.5 SAFETY (NEEDLE) ×2 IMPLANT
NDL SPNL 18GX3.5 QUINCKE PK (NEEDLE) ×2 IMPLANT
NEEDLE HYPO 25X1 1.5 SAFETY (NEEDLE) ×2 IMPLANT
NEEDLE SPNL 18GX3.5 QUINCKE PK (NEEDLE) ×2 IMPLANT
PACK CYSTO AR (MISCELLANEOUS) ×2 IMPLANT
PAD PREP 24X41 OB/GYN DISP (PERSONAL CARE ITEMS) ×2 IMPLANT
SET CYSTO W/LG BORE CLAMP LF (SET/KITS/TRAYS/PACK) ×2 IMPLANT
SPONGE DRAIN TRACH 4X4 STRL 2S (GAUZE/BANDAGES/DRESSINGS) ×2 IMPLANT
SURGILUBE 2OZ TUBE FLIPTOP (MISCELLANEOUS) ×2 IMPLANT
SUT SILK 0 SH 30 (SUTURE) ×2 IMPLANT
SUT SILK 2 0 SH (SUTURE) IMPLANT
SYR 10ML LL (SYRINGE) ×4 IMPLANT
SYR TOOMEY IRRIG 70ML (MISCELLANEOUS) ×2
SYRINGE TOOMEY IRRIG 70ML (MISCELLANEOUS) ×2 IMPLANT
TRAP FLUID SMOKE EVACUATOR (MISCELLANEOUS) ×2 IMPLANT
WATER STERILE IRR 1000ML POUR (IV SOLUTION) ×2 IMPLANT
WATER STERILE IRR 500ML POUR (IV SOLUTION) ×2 IMPLANT

## 2022-03-12 NOTE — Interval H&P Note (Signed)
History and Physical Interval Note:  RRR CTAB  03/12/2022 11:03 AM  Leslie Houston  has presented today for surgery, with the diagnosis of Urinary Retention, Bilateral Hydronephrosis.  The various methods of treatment have been discussed with the patient and family. After consideration of risks, benefits and other options for treatment, the patient has consented to  Procedure(s): CYSTOSCOPY (N/A) INSERTION OF SUPRAPUBIC CATHETER (N/A) PELVIC EXAM UNDER ANESTHESIA (N/A) as a surgical intervention.  The patient's history has been reviewed, patient examined, no change in status, stable for surgery.  I have reviewed the patient's chart and labs.  Questions were answered to the patient's satisfaction.     [name]

## 2022-03-12 NOTE — Telephone Encounter (Signed)
Triedd to call friend and her mailbox was full

## 2022-03-12 NOTE — Anesthesia Procedure Notes (Signed)
Procedure Name: LMA Insertion Date/Time: 03/12/2022 11:38 AM  Performed by: Lerry Liner, CRNAPre-anesthesia Checklist: Timeout performed, Patient being monitored, Suction available, Emergency Drugs available and Patient identified Patient Re-evaluated:Patient Re-evaluated prior to induction Oxygen Delivery Method: Circle system utilized Preoxygenation: Pre-oxygenation with 100% oxygen Induction Type: IV induction Ventilation: Mask ventilation without difficulty LMA: LMA inserted LMA Size: 4.0

## 2022-03-12 NOTE — Op Note (Signed)
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  Debris-filled cloudy urine consistent with urinary stasis, some squamous metaplasia and nonspecific bladder erythema but no obvious tumor.  SP tube placed under direct visualization cystoscopically with the assistance of ultrasound guidance suprapubically.  EBL: Minimal  Specimens: None  Drains: 16 French Foley catheter per urethra as well as per suprapubic tube tract, 30 cc balloons  Indication: Leslie Houston is a 71 y.o. patient with chronic urinary retention and bilateral hydronephrosis.  After reviewing the management options for treatment, she elected to proceed with the above surgical procedure(s). We have discussed the potential benefits and risks of the procedure, side effects of the proposed treatment, the likelihood of the patient achieving the goals of the procedure, and any potential problems that might occur during the procedure or recuperation. Informed consent has been obtained.  Description of procedure:  The patient was taken to the operating room and general anesthesia was induced.  The patient was placed in the dorsal lithotomy position, prepped and draped in the usual sterile fashion, and preoperative antibiotics were administered. A preoperative time-out was performed.   Pelvic exam under anesthesia using half speculum revealed surgically absent uterus and no obvious cervix.  There was fairly good vaginal vault support without significant prolapse appreciated.  She did  have some periurethral redundant tissue possibly mild prolapse but no external tumors.  I attempted advanced a 23 French cystoscope per urethra into the bladder however quickly identified a female mid urethral stricture, approximately 8 [place] or so in diameter.  Was unable to pass the scope.  I used female sounds starting at 55 [place] all the way up to 33 [place] serially.  After this was accomplished, was able to advance a 21 French rigid cystoscope into the bladder.  The bladder was noted to be cloudy, visualization was poor and debris-filled urine was drained.  Upon rinsing the bladder several times, several things were noted including areas of squamous metaplasia, some diffuse nonspecific cystitis and areas of bullous edema, few [place] diverticula as well as capacious bilateral ureteral orifice x 2 which was concerning for reflux.  There is no papillary tumors identified or any other findings that were not consistent with an inflammatory chronic process.  Next, attention was turned to placement of the suprapubic tube.  I used an ultrasound probe using abdominal settings to survey of the anterior abdominal wall just above the pubic symphysis.  There is no bowel noted in this area with a distended bladder.  I used 10 cc of 1% lidocaine to anesthetize the area.  I then used an 18-gauge spinal needle under both direct vision cystoscopically as well as ultrasound guided into the bladder to gauge the depth of the abdominal wall.  I used a Kelly to probe down to the fascia.  I then used a 56 [place] SP tube trocar with a metal cannula and advanced the trocar into the anterior bladder wall.  Notably, I did retract the pannus superiorly in order to achieve this.  Once the trocar was adequate position within the bladder, the inner lumen of the trocar was removed and replaced with a 16 [place] Silastic catheter.  The balloon was inflated to 30 cc of sterile water.  This was then secured to the patient's suprapubic area  using 2-0 silk x2.  After being cleaned dried, a drain sponge was placed.  Next, a 36 French Foley catheter was placed transurethrally and the balloon was filled with also 30 cc of sterile water.  This was plugged.  The suprapubic tube was attached to a drainage bag.  She was then cleaned and dried, repositioned in the supine position, reversed from anesthesia, taken to the PACU in stable condition.    Plan: Plan to leave urethral Foley for a week to allow urethral to heal with a patent tract.  We will plan for her first SP tube exchange in about a month also get a renal ultrasound as well as a BMP at that point in time to assess for improved renal function and decreased hydronephrosis.  [name], M.D.

## 2022-03-12 NOTE — Anesthesia Preprocedure Evaluation (Addendum)
Anesthesia Evaluation  Patient identified by MRN, date of birth, ID band Patient awake    Reviewed: Allergy & Precautions, NPO status , Patient's Chart, lab work & pertinent test results  History of Anesthesia Complications (+) PONV and history of anesthetic complications  Airway Mallampati: II  TM Distance: >3 FB Neck ROM: full    Dental  (+) Poor Dentition, Missing, Edentulous Upper   Pulmonary sleep apnea , former smoker   Pulmonary exam normal        Cardiovascular hypertension, On Medications + CAD, + Cardiac Stents, + Peripheral Vascular Disease (carotid stenosis) and +CHF  Normal cardiovascular exam+ dysrhythmias (1st degree AV block, sinus bradycardia with sinus arrhythmia)      Neuro/Psych Seizures -,  PSYCHIATRIC DISORDERS Anxiety Depression     Neuromuscular disease CVA, Residual Symptoms    GI/Hepatic Neg liver ROS, PUD,GERD  Medicated,,  Endo/Other  diabetes    Renal/GU Renal disease  negative genitourinary   Musculoskeletal   Abdominal   Peds  Hematology negative hematology ROS (+)   Anesthesia Other Findings Past Medical History: No date: (HFpEF) heart failure with preserved ejection fraction (HCC)     Comment:  a. Pt reports in 2000 she had CHF, details unclear,               outside hospital; b. 06/2019 Echo: EF 50-55%, no rwma, Gr2              DD,  mildly dil LA, mild MR. 06/22/2014: Acute left PCA stroke (HCC) No date: Anxiety     Comment:  a.) on BZO (alprazolam) PRN No date: CAD (coronary artery disease)     Comment:  a. Pt reports in 2000 she had a stent to a coronary               artery, details unclear, outside hospital. No date: Carotid stenosis     Comment:  a. CT angio head 07/2014 - 50-60% stenoses of prox ICA               bilaterally. No date: CKD (chronic kidney disease), stage IV (HCC) No date: Depression No date: Diabetes mellitus ([place])     Comment:  Denies. No date:  Edentulous     Comment:  No upper teeth 06/13/2018: Family hx of colon cancer     Comment:  Father diagnosed in his 55's No date: Gout 03/21/2021: History of cataract extraction, left No date: HOH (hard of hearing)     Comment:  usually needs to read lips No date: Hyperlipidemia No date: Hypertension No date: Intellectual disability     Comment:  a.) born premature, limited formal education, has never               driven No date: Myalgia due to statin No date: Orthostatic hypotension No date: OSA (obstructive sleep apnea)     Comment:  a.) not on nocturnal PAP therapy; old/nonfuntioning DME               - referred for repeat PSG 01/2022 by neurology 01/24/2018: Osteopenia     Comment:  DEXA Sept 2019 No date: PONV (postoperative nausea and vomiting) No date: PTSD (post-traumatic stress disorder)     Comment:    No date: PUD (peptic ulcer disease) No date: PVC's (premature ventricular contractions) 05/2014: Right sided weakness     Comment:  a.) s/p CVA No date: Seizures ([place]) No date: Sinus bradycardia No date: Syncope No date: Visual disturbance as complication  of stroke (RIGHT eye)  Past Surgical History: No date: BLADDER SURGERY 03/21/2021: CATARACT EXTRACTION W/PHACO; Left     Comment:  Procedure: CATARACT EXTRACTION PHACO AND INTRAOCULAR               LENS PLACEMENT ([place]) left 5.86 00:38.0;  Surgeon:               Birder Robson, MD;  Location: [facility];                Service: Ophthalmology;  Laterality: Left;  Latex No date: CESAREAN SECTION No date: CORONARY ANGIOPLASTY WITH STENT PLACEMENT No date: DENTAL SURGERY     Reproductive/Obstetrics negative OB ROS                             Anesthesia Physical Anesthesia Plan  ASA: 3  Anesthesia Plan: General   Post-op Pain Management: Minimal or no pain anticipated   Induction: Intravenous  PONV Risk Score and Plan: 4 or greater and Propofol infusion and  TIVA  Airway Management Planned: LMA  Additional Equipment:   Intra-op Plan:   Post-operative Plan: Extubation in OR  Informed Consent: I have reviewed the patients History and Physical, chart, labs and discussed the procedure including the risks, benefits and alternatives for the proposed anesthesia with the patient or authorized representative who has indicated his/her understanding and acceptance.     Dental Advisory Given  Plan Discussed with: Anesthesiologist, CRNA and Surgeon  Anesthesia Plan Comments: (Patient consented for risks of anesthesia including but not limited to:  - adverse reactions to medications - damage to eyes, teeth, lips or other oral mucosa - nerve damage due to positioning  - sore throat or hoarseness - Damage to heart, brain, nerves, lungs, other parts of body or loss of life  Patient voiced understanding.)        Anesthesia Quick Evaluation

## 2022-03-12 NOTE — Transfer of Care (Signed)
Immediate Anesthesia Transfer of Care Note  Patient: Leslie Houston  Procedure(s) Performed: CYSTOSCOPY INSERTION OF SUPRAPUBIC CATHETER PELVIC EXAM UNDER ANESTHESIA URETHRAL DILATION UNDER ANESTHESIA  Patient Location: PACU  Anesthesia Type:General  Level of Consciousness: drowsy  Airway & Oxygen Therapy: Patient Spontanous Breathing and Patient connected to face mask oxygen  Post-op Assessment: Report given to RN  Post vital signs: stable  Last Vitals:  Vitals Value Taken Time  BP 91/45 03/12/22 1219  Temp    Pulse 54 03/12/22 1222  Resp 16 03/12/22 1222  SpO2 100 % 03/12/22 1222  Vitals shown include unvalidated device data.  Last Pain:  Vitals:   03/12/22 0915  TempSrc: Temporal  PainSc: 0-No pain         Complications: No notable events documented.

## 2022-03-12 NOTE — Discharge Instructions (Addendum)
You may replace the dressing/gauze around the suprapubic catheter site as it becomes wet or soiled as needed.  You also have a Foley catheter in place through your urethra.  We will remove this in 1 week.  You may shower starting tomorrow, replace dressing as needed.  AMBULATORY SURGERY  DISCHARGE INSTRUCTIONS   The drugs that you were given will stay in your system until tomorrow so for the next 24 hours you should not:  Drive an automobile Make any legal decisions Drink any alcoholic beverage   You may resume regular meals tomorrow.  Today it is better to start with liquids and gradually work up to solid foods.  You may eat anything you prefer, but it is better to start with liquids, then soup and crackers, and gradually work up to solid foods.   Please notify your doctor immediately if you have any unusual bleeding, trouble breathing, redness and pain at the surgery site, drainage, fever, or pain not relieved by medication.    Additional Instructions:        Please contact your physician with any problems or Same Day Surgery at [phone], Monday through Friday 6 am to 4 pm, or [place] [facility] number at [phone].

## 2022-03-12 NOTE — Anesthesia Postprocedure Evaluation (Signed)
Anesthesia Post Note  Patient: Takiyah Bohnsack  Procedure(s) Performed: CYSTOSCOPY INSERTION OF SUPRAPUBIC CATHETER PELVIC EXAM UNDER ANESTHESIA URETHRAL DILATION UNDER ANESTHESIA  Patient location during evaluation: PACU Anesthesia Type: General Level of consciousness: awake and alert Pain management: pain level controlled Vital Signs Assessment: post-procedure vital signs reviewed and stable Respiratory status: spontaneous breathing, nonlabored ventilation, respiratory function stable and patient connected to nasal cannula oxygen Cardiovascular status: blood pressure returned to baseline and stable Postop Assessment: no apparent nausea or vomiting Anesthetic complications: no   No notable events documented.   Last Vitals:  Vitals:   03/12/22 1219 03/12/22 1230  BP: (!) 91/45 (!) 93/52  Pulse: 60 (!) 55  Resp: 15 14  Temp: (!) 36.4 C   SpO2: 100% 96%    Last Pain:  Vitals:   03/12/22 1230  TempSrc:   PainSc: 0-No pain                 [name]

## 2022-03-13 ENCOUNTER — Encounter: Payer: Self-pay | Admitting: Urology

## 2022-03-13 ENCOUNTER — Other Ambulatory Visit: Payer: Medicare Other | Admitting: *Deleted

## 2022-03-13 NOTE — Telephone Encounter (Signed)
MAILBOX FULL

## 2022-03-14 ENCOUNTER — Inpatient Hospital Stay: Payer: Medicare Other

## 2022-03-14 ENCOUNTER — Encounter: Payer: Self-pay | Admitting: Internal Medicine

## 2022-03-14 ENCOUNTER — Emergency Department: Payer: Medicare Other

## 2022-03-14 ENCOUNTER — Other Ambulatory Visit: Payer: Self-pay

## 2022-03-14 ENCOUNTER — Inpatient Hospital Stay
Admission: EM | Admit: 2022-03-14 | Discharge: 2022-04-05 | DRG: 389 | Disposition: A | Discharge status: Home Health | Payer: Medicare Other | Attending: Internal Medicine | Admitting: Internal Medicine

## 2022-03-14 DIAGNOSIS — F419 Anxiety disorder, unspecified: Secondary | ICD-10-CM | POA: Diagnosis present

## 2022-03-14 DIAGNOSIS — R1030 Lower abdominal pain, unspecified: Secondary | ICD-10-CM | POA: Diagnosis not present

## 2022-03-14 DIAGNOSIS — R1084 Generalized abdominal pain: Principal | ICD-10-CM

## 2022-03-14 DIAGNOSIS — R569 Unspecified convulsions: Secondary | ICD-10-CM

## 2022-03-14 DIAGNOSIS — I517 Cardiomegaly: Secondary | ICD-10-CM | POA: Diagnosis not present

## 2022-03-14 DIAGNOSIS — K5900 Constipation, unspecified: Secondary | ICD-10-CM

## 2022-03-14 DIAGNOSIS — N390 Urinary tract infection, site not specified: Secondary | ICD-10-CM | POA: Diagnosis present

## 2022-03-14 DIAGNOSIS — R0902 Hypoxemia: Secondary | ICD-10-CM | POA: Diagnosis not present

## 2022-03-14 DIAGNOSIS — E870 Hyperosmolality and hypernatremia: Secondary | ICD-10-CM | POA: Diagnosis not present

## 2022-03-14 DIAGNOSIS — G40909 Epilepsy, unspecified, not intractable, without status epilepticus: Secondary | ICD-10-CM | POA: Diagnosis not present

## 2022-03-14 DIAGNOSIS — J9811 Atelectasis: Secondary | ICD-10-CM | POA: Diagnosis not present

## 2022-03-14 DIAGNOSIS — K565 Intestinal adhesions [bands], unspecified as to partial versus complete obstruction: Secondary | ICD-10-CM | POA: Diagnosis not present

## 2022-03-14 DIAGNOSIS — K3189 Other diseases of stomach and duodenum: Secondary | ICD-10-CM | POA: Diagnosis not present

## 2022-03-14 DIAGNOSIS — I2510 Atherosclerotic heart disease of native coronary artery without angina pectoris: Secondary | ICD-10-CM | POA: Diagnosis present

## 2022-03-14 DIAGNOSIS — T83030A Leakage of cystostomy catheter, initial encounter: Secondary | ICD-10-CM | POA: Diagnosis not present

## 2022-03-14 DIAGNOSIS — F79 Unspecified intellectual disabilities: Secondary | ICD-10-CM | POA: Diagnosis present

## 2022-03-14 DIAGNOSIS — Y838 Other surgical procedures as the cause of abnormal reaction of the patient, or of later complication, without mention of misadventure at the time of the procedure: Secondary | ICD-10-CM | POA: Diagnosis not present

## 2022-03-14 DIAGNOSIS — I69351 Hemiplegia and hemiparesis following cerebral infarction affecting right dominant side: Secondary | ICD-10-CM | POA: Diagnosis not present

## 2022-03-14 DIAGNOSIS — N136 Pyonephrosis: Secondary | ICD-10-CM | POA: Diagnosis present

## 2022-03-14 DIAGNOSIS — H9190 Unspecified hearing loss, unspecified ear: Secondary | ICD-10-CM | POA: Diagnosis present

## 2022-03-14 DIAGNOSIS — Z801 Family history of malignant neoplasm of trachea, bronchus and lung: Secondary | ICD-10-CM

## 2022-03-14 DIAGNOSIS — I5032 Chronic diastolic (congestive) heart failure: Secondary | ICD-10-CM | POA: Diagnosis not present

## 2022-03-14 DIAGNOSIS — N134 Hydroureter: Secondary | ICD-10-CM | POA: Diagnosis not present

## 2022-03-14 DIAGNOSIS — Z888 Allergy status to other drugs, medicaments and biological substances status: Secondary | ICD-10-CM

## 2022-03-14 DIAGNOSIS — I509 Heart failure, unspecified: Secondary | ICD-10-CM | POA: Diagnosis not present

## 2022-03-14 DIAGNOSIS — Z823 Family history of stroke: Secondary | ICD-10-CM

## 2022-03-14 DIAGNOSIS — E1122 Type 2 diabetes mellitus with diabetic chronic kidney disease: Secondary | ICD-10-CM | POA: Diagnosis present

## 2022-03-14 DIAGNOSIS — K567 Ileus, unspecified: Secondary | ICD-10-CM | POA: Diagnosis not present

## 2022-03-14 DIAGNOSIS — R1111 Vomiting without nausea: Secondary | ICD-10-CM | POA: Diagnosis not present

## 2022-03-14 DIAGNOSIS — E860 Dehydration: Secondary | ICD-10-CM | POA: Diagnosis not present

## 2022-03-14 DIAGNOSIS — Z87891 Personal history of nicotine dependence: Secondary | ICD-10-CM

## 2022-03-14 DIAGNOSIS — I959 Hypotension, unspecified: Secondary | ICD-10-CM | POA: Diagnosis present

## 2022-03-14 DIAGNOSIS — M8580 Other specified disorders of bone density and structure, unspecified site: Secondary | ICD-10-CM | POA: Diagnosis present

## 2022-03-14 DIAGNOSIS — E669 Obesity, unspecified: Secondary | ICD-10-CM | POA: Diagnosis present

## 2022-03-14 DIAGNOSIS — Z9889 Other specified postprocedural states: Secondary | ICD-10-CM | POA: Diagnosis not present

## 2022-03-14 DIAGNOSIS — N184 Chronic kidney disease, stage 4 (severe): Secondary | ICD-10-CM | POA: Diagnosis not present

## 2022-03-14 DIAGNOSIS — I69354 Hemiplegia and hemiparesis following cerebral infarction affecting left non-dominant side: Secondary | ICD-10-CM | POA: Diagnosis not present

## 2022-03-14 DIAGNOSIS — R0689 Other abnormalities of breathing: Secondary | ICD-10-CM | POA: Diagnosis not present

## 2022-03-14 DIAGNOSIS — K5939 Other megacolon: Secondary | ICD-10-CM | POA: Diagnosis not present

## 2022-03-14 DIAGNOSIS — Z800 Family history of malignant neoplasm of digestive organs: Secondary | ICD-10-CM

## 2022-03-14 DIAGNOSIS — I130 Hypertensive heart and chronic kidney disease with heart failure and stage 1 through stage 4 chronic kidney disease, or unspecified chronic kidney disease: Secondary | ICD-10-CM | POA: Diagnosis present

## 2022-03-14 DIAGNOSIS — Z7982 Long term (current) use of aspirin: Secondary | ICD-10-CM

## 2022-03-14 DIAGNOSIS — Z6835 Body mass index (BMI) 35.0-35.9, adult: Secondary | ICD-10-CM

## 2022-03-14 DIAGNOSIS — N261 Atrophy of kidney (terminal): Secondary | ICD-10-CM | POA: Diagnosis not present

## 2022-03-14 DIAGNOSIS — E162 Hypoglycemia, unspecified: Secondary | ICD-10-CM | POA: Diagnosis not present

## 2022-03-14 DIAGNOSIS — I700 Atherosclerosis of aorta: Secondary | ICD-10-CM | POA: Diagnosis not present

## 2022-03-14 DIAGNOSIS — E875 Hyperkalemia: Secondary | ICD-10-CM | POA: Diagnosis not present

## 2022-03-14 DIAGNOSIS — F32A Depression, unspecified: Secondary | ICD-10-CM | POA: Diagnosis not present

## 2022-03-14 DIAGNOSIS — Z91040 Latex allergy status: Secondary | ICD-10-CM

## 2022-03-14 DIAGNOSIS — E876 Hypokalemia: Secondary | ICD-10-CM | POA: Diagnosis not present

## 2022-03-14 DIAGNOSIS — N3289 Other specified disorders of bladder: Secondary | ICD-10-CM | POA: Diagnosis not present

## 2022-03-14 DIAGNOSIS — K219 Gastro-esophageal reflux disease without esophagitis: Secondary | ICD-10-CM | POA: Diagnosis present

## 2022-03-14 DIAGNOSIS — Z1152 Encounter for screening for COVID-19: Secondary | ICD-10-CM | POA: Diagnosis not present

## 2022-03-14 DIAGNOSIS — N3592 Unspecified urethral stricture, female: Secondary | ICD-10-CM | POA: Diagnosis not present

## 2022-03-14 DIAGNOSIS — I10 Essential (primary) hypertension: Secondary | ICD-10-CM | POA: Diagnosis not present

## 2022-03-14 DIAGNOSIS — E872 Acidosis, unspecified: Secondary | ICD-10-CM | POA: Diagnosis not present

## 2022-03-14 DIAGNOSIS — K56600 Partial intestinal obstruction, unspecified as to cause: Secondary | ICD-10-CM | POA: Diagnosis not present

## 2022-03-14 DIAGNOSIS — R509 Fever, unspecified: Secondary | ICD-10-CM | POA: Diagnosis not present

## 2022-03-14 DIAGNOSIS — E785 Hyperlipidemia, unspecified: Secondary | ICD-10-CM | POA: Diagnosis not present

## 2022-03-14 DIAGNOSIS — T83510D Infection and inflammatory reaction due to cystostomy catheter, subsequent encounter: Secondary | ICD-10-CM | POA: Diagnosis not present

## 2022-03-14 DIAGNOSIS — K5669 Other partial intestinal obstruction: Secondary | ICD-10-CM | POA: Diagnosis not present

## 2022-03-14 DIAGNOSIS — Y846 Urinary catheterization as the cause of abnormal reaction of the patient, or of later complication, without mention of misadventure at the time of the procedure: Secondary | ICD-10-CM | POA: Diagnosis present

## 2022-03-14 DIAGNOSIS — R112 Nausea with vomiting, unspecified: Secondary | ICD-10-CM

## 2022-03-14 DIAGNOSIS — K56609 Unspecified intestinal obstruction, unspecified as to partial versus complete obstruction: Secondary | ICD-10-CM | POA: Diagnosis present

## 2022-03-14 DIAGNOSIS — Z79899 Other long term (current) drug therapy: Secondary | ICD-10-CM

## 2022-03-14 DIAGNOSIS — K9189 Other postprocedural complications and disorders of digestive system: Secondary | ICD-10-CM | POA: Diagnosis not present

## 2022-03-14 DIAGNOSIS — Z881 Allergy status to other antibiotic agents status: Secondary | ICD-10-CM

## 2022-03-14 DIAGNOSIS — R338 Other retention of urine: Secondary | ICD-10-CM | POA: Diagnosis present

## 2022-03-14 DIAGNOSIS — R339 Retention of urine, unspecified: Secondary | ICD-10-CM

## 2022-03-14 DIAGNOSIS — I69398 Other sequelae of cerebral infarction: Secondary | ICD-10-CM

## 2022-03-14 DIAGNOSIS — Z8042 Family history of malignant neoplasm of prostate: Secondary | ICD-10-CM

## 2022-03-14 DIAGNOSIS — Z820 Family history of epilepsy and other diseases of the nervous system: Secondary | ICD-10-CM

## 2022-03-14 DIAGNOSIS — Y738 Miscellaneous gastroenterology and urology devices associated with adverse incidents, not elsewhere classified: Secondary | ICD-10-CM | POA: Diagnosis not present

## 2022-03-14 DIAGNOSIS — Z880 Allergy status to penicillin: Secondary | ICD-10-CM

## 2022-03-14 DIAGNOSIS — R6510 Systemic inflammatory response syndrome (SIRS) of non-infectious origin without acute organ dysfunction: Secondary | ICD-10-CM | POA: Diagnosis present

## 2022-03-14 DIAGNOSIS — Z955 Presence of coronary angioplasty implant and graft: Secondary | ICD-10-CM

## 2022-03-14 DIAGNOSIS — Z8249 Family history of ischemic heart disease and other diseases of the circulatory system: Secondary | ICD-10-CM

## 2022-03-14 DIAGNOSIS — R140 Abdominal distension (gaseous): Secondary | ICD-10-CM | POA: Diagnosis not present

## 2022-03-14 DIAGNOSIS — N3582 Other urethral stricture, female: Secondary | ICD-10-CM | POA: Diagnosis not present

## 2022-03-14 DIAGNOSIS — R109 Unspecified abdominal pain: Secondary | ICD-10-CM | POA: Diagnosis not present

## 2022-03-14 DIAGNOSIS — K660 Peritoneal adhesions (postprocedural) (postinfection): Secondary | ICD-10-CM | POA: Diagnosis present

## 2022-03-14 DIAGNOSIS — K6389 Other specified diseases of intestine: Secondary | ICD-10-CM | POA: Diagnosis not present

## 2022-03-14 DIAGNOSIS — Z833 Family history of diabetes mellitus: Secondary | ICD-10-CM

## 2022-03-14 DIAGNOSIS — Z8711 Personal history of peptic ulcer disease: Secondary | ICD-10-CM

## 2022-03-14 DIAGNOSIS — Z91048 Other nonmedicinal substance allergy status: Secondary | ICD-10-CM

## 2022-03-14 DIAGNOSIS — Z4682 Encounter for fitting and adjustment of non-vascular catheter: Secondary | ICD-10-CM | POA: Diagnosis not present

## 2022-03-14 DIAGNOSIS — K5651 Intestinal adhesions [bands], with partial obstruction: Secondary | ICD-10-CM | POA: Diagnosis not present

## 2022-03-14 DIAGNOSIS — N1330 Unspecified hydronephrosis: Secondary | ICD-10-CM | POA: Diagnosis not present

## 2022-03-14 LAB — PROTIME-INR
INR: 1.4 — ABNORMAL HIGH (ref 0.8–1.2)
Prothrombin Time: 16.7 seconds — ABNORMAL HIGH (ref 11.4–15.2)

## 2022-03-14 LAB — URINALYSIS, COMPLETE (UACMP) WITH MICROSCOPIC
Bilirubin Urine: NEGATIVE
Glucose, UA: NEGATIVE mg/dL
Ketones, ur: NEGATIVE mg/dL
Nitrite: NEGATIVE
Protein, ur: 100 mg/dL — AB
RBC / HPF: >50 RBC/hpf — ABNORMAL HIGH (ref 0–5)
Specific Gravity, Urine: 1.012 (ref 1.005–1.030)
WBC, UA: >50 WBC/hpf — ABNORMAL HIGH (ref 0–5)
pH: 5 (ref 5.0–8.0)

## 2022-03-14 LAB — CBC WITH DIFFERENTIAL/PLATELET
Abs Immature Granulocytes: 0.09 10*3/uL — ABNORMAL HIGH (ref 0.00–0.07)
Basophils Absolute: 0 10*3/uL (ref 0.0–0.1)
Basophils Relative: 0 %
Eosinophils Absolute: 0 10*3/uL (ref 0.0–0.5)
Eosinophils Relative: 0 %
HCT: 34.5 % — ABNORMAL LOW (ref 36.0–46.0)
Hemoglobin: 11.1 g/dL — ABNORMAL LOW (ref 12.0–15.0)
Immature Granulocytes: 1 %
Lymphocytes Relative: 5 %
Lymphs Abs: 0.7 10*3/uL (ref 0.7–4.0)
MCH: 32.2 pg (ref 26.0–34.0)
MCHC: 32.2 g/dL (ref 30.0–36.0)
MCV: 100 fL (ref 80.0–100.0)
Monocytes Absolute: 0.4 10*3/uL (ref 0.1–1.0)
Monocytes Relative: 3 %
Neutro Abs: 11.7 10*3/uL — ABNORMAL HIGH (ref 1.7–7.7)
Neutrophils Relative %: 91 %
Platelets: 193 10*3/uL (ref 150–400)
RBC: 3.45 MIL/uL — ABNORMAL LOW (ref 3.87–5.11)
RDW: 12.8 % (ref 11.5–15.5)
WBC: 12.9 10*3/uL — ABNORMAL HIGH (ref 4.0–10.5)
nRBC: 0 % (ref 0.0–0.2)

## 2022-03-14 LAB — COMPREHENSIVE METABOLIC PANEL
ALT: 9 U/L (ref 0–44)
AST: 16 U/L (ref 15–41)
Albumin: 3.3 g/dL — ABNORMAL LOW (ref 3.5–5.0)
Alkaline Phosphatase: 88 U/L (ref 38–126)
Anion gap: 10 (ref 5–15)
BUN: 39 mg/dL — ABNORMAL HIGH (ref 8–23)
CO2: 21 mmol/L — ABNORMAL LOW (ref 22–32)
Calcium: 9.5 mg/dL (ref 8.9–10.3)
Chloride: 111 mmol/L (ref 98–111)
Creatinine, Ser: 2.96 mg/dL — ABNORMAL HIGH (ref 0.44–1.00)
GFR, Estimated: 16 mL/min — ABNORMAL LOW (ref >60)
Glucose, Bld: 105 mg/dL — ABNORMAL HIGH (ref 70–99)
Potassium: 3.6 mmol/L (ref 3.5–5.1)
Sodium: 142 mmol/L (ref 135–145)
Total Bilirubin: 0.6 mg/dL (ref 0.3–1.2)
Total Protein: 7.5 g/dL (ref 6.5–8.1)

## 2022-03-14 LAB — RESP PANEL BY RT-PCR (FLU A&B, COVID) ARPGX2
Influenza A by PCR: NEGATIVE
Influenza B by PCR: NEGATIVE
SARS Coronavirus 2 by RT PCR: NEGATIVE

## 2022-03-14 LAB — APTT: aPTT: 30 seconds (ref 24–36)

## 2022-03-14 LAB — LACTIC ACID, PLASMA: Lactic Acid, Venous: 0.8 mmol/L (ref 0.5–1.9)

## 2022-03-14 MED ORDER — SODIUM CHLORIDE 0.9 % IV SOLN
2.0000 g | INTRAVENOUS | Status: DC
  Filled 2022-03-14: qty 12.5

## 2022-03-14 MED ORDER — ACETAMINOPHEN 325 MG PO TABS
650.0000 mg | ORAL_TABLET | Freq: Once | ORAL | Status: AC
  Administered 2022-03-14: 650 mg via ORAL
  Filled 2022-03-14: qty 2

## 2022-03-14 MED ORDER — SODIUM CHLORIDE 0.9 % IV SOLN
2.0000 g | Freq: Once | INTRAVENOUS | Status: AC
  Administered 2022-03-14: 2 g via INTRAVENOUS
  Filled 2022-03-14: qty 12.5

## 2022-03-14 MED ORDER — ACETAMINOPHEN 325 MG PO TABS
650.0000 mg | ORAL_TABLET | Freq: Four times a day (QID) | ORAL | Status: DC | PRN
  Administered 2022-03-14 – 2022-04-04 (×3): 650 mg via ORAL
  Filled 2022-03-14 (×3): qty 2

## 2022-03-14 MED ORDER — TAMSULOSIN HCL 0.4 MG PO CAPS
0.4000 mg | ORAL_CAPSULE | Freq: Every day | ORAL | Status: DC
  Administered 2022-03-15 – 2022-04-05 (×20): 0.4 mg via ORAL
  Filled 2022-03-14 (×22): qty 1

## 2022-03-14 MED ORDER — MORPHINE SULFATE (PF) 4 MG/ML IV SOLN
4.0000 mg | Freq: Once | INTRAVENOUS | Status: AC
  Administered 2022-03-14: 4 mg via INTRAVENOUS
  Filled 2022-03-14: qty 1

## 2022-03-14 MED ORDER — ONDANSETRON HCL 4 MG/2ML IJ SOLN
4.0000 mg | Freq: Four times a day (QID) | INTRAMUSCULAR | Status: DC | PRN
  Administered 2022-03-15 – 2022-04-02 (×8): 4 mg via INTRAVENOUS
  Filled 2022-03-14 (×10): qty 2

## 2022-03-14 MED ORDER — CHLORHEXIDINE GLUCONATE CLOTH 2 % EX PADS
6.0000 | MEDICATED_PAD | Freq: Every day | CUTANEOUS | Status: DC
  Administered 2022-03-15 – 2022-04-05 (×22): 6 via TOPICAL

## 2022-03-14 MED ORDER — SODIUM CHLORIDE 0.9 % IV SOLN: INTRAVENOUS | Status: DC

## 2022-03-14 MED ORDER — EZETIMIBE 10 MG PO TABS: 10.0000 mg | ORAL_TABLET | Freq: Every day | ORAL | Status: DC

## 2022-03-14 MED ORDER — LACTATED RINGERS IV BOLUS (SEPSIS)
500.0000 mL | Freq: Once | INTRAVENOUS | Status: AC
  Administered 2022-03-14: 500 mL via INTRAVENOUS

## 2022-03-14 MED ORDER — PANTOPRAZOLE SODIUM 40 MG IV SOLR
40.0000 mg | INTRAVENOUS | Status: DC
  Administered 2022-03-14 – 2022-04-05 (×23): 40 mg via INTRAVENOUS
  Filled 2022-03-14 (×23): qty 10

## 2022-03-14 MED ORDER — ASPIRIN 81 MG PO TBEC: 81.0000 mg | DELAYED_RELEASE_TABLET | Freq: Every day | ORAL | Status: DC

## 2022-03-14 MED ORDER — ONDANSETRON HCL 4 MG PO TABS: 4.0000 mg | ORAL_TABLET | Freq: Four times a day (QID) | ORAL | Status: DC | PRN

## 2022-03-14 MED ORDER — HYDROMORPHONE HCL 1 MG/ML IJ SOLN
0.5000 mg | INTRAMUSCULAR | Status: DC | PRN
  Administered 2022-03-15: 0.5 mg via INTRAVENOUS
  Filled 2022-03-14 (×2): qty 1

## 2022-03-14 MED ORDER — LEVETIRACETAM IN NACL 1000 MG/100ML IV SOLN
1000.0000 mg | Freq: Two times a day (BID) | INTRAVENOUS | Status: DC
  Administered 2022-03-14 – 2022-04-05 (×44): 1000 mg via INTRAVENOUS
  Filled 2022-03-14 (×45): qty 100

## 2022-03-14 MED ORDER — ONDANSETRON HCL 4 MG/2ML IJ SOLN
4.0000 mg | Freq: Once | INTRAMUSCULAR | Status: AC
  Administered 2022-03-14: 4 mg via INTRAVENOUS
  Filled 2022-03-14: qty 2

## 2022-03-14 MED ORDER — VANCOMYCIN HCL IN DEXTROSE 1-5 GM/200ML-% IV SOLN
1000.0000 mg | Freq: Once | INTRAVENOUS | Status: AC
  Administered 2022-03-14: 1000 mg via INTRAVENOUS
  Filled 2022-03-14: qty 200

## 2022-03-14 MED ORDER — CITALOPRAM HYDROBROMIDE 20 MG PO TABS: 20.0000 mg | ORAL_TABLET | Freq: Every day | ORAL | Status: DC

## 2022-03-14 NOTE — ED Notes (Signed)
Alert, NAD, calm, interactive, nausea resolved, pain decreased, VSS, to CT.
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
## 2022-03-14 NOTE — Progress Notes (Signed)
CODE SEPSIS - PHARMACY COMMUNICATION  **Broad Spectrum Antibiotics should be administered within 1 hour of Sepsis diagnosis**  Time Code Sepsis Called/Page Received: 0804  Antibiotics Ordered: vancomycin and cefepime  Time of 1st antibiotic administration: 0855  Additional action taken by pharmacy: msgd  If necessary, Name of Provider/Nurse Contacted: RN    Wynelle Cleveland ,PharmD Clinical Pharmacist  03/14/2022  8:06 AM

## 2022-03-14 NOTE — Assessment & Plan Note (Addendum)
Versus abnormal UA due to colonization and recent instrumentation.  Started on empiric IV antibiotics on admission.  Urine culture negative. Stop antibiotics and monitor clinically. Follow up pending blood culture (no growth to date)

## 2022-03-14 NOTE — ED Notes (Addendum)
Per verbal from EDP SM, [name] updated at pt and family's request.

## 2022-03-14 NOTE — ED Notes (Signed)
Pt denies nausea currently. Family remains at bedside.

## 2022-03-14 NOTE — Progress Notes (Signed)

## 2022-03-14 NOTE — ED Notes (Signed)
Informed RN bed assigned 

## 2022-03-14 NOTE — Consult Note (Signed)
Pharmacy Antibiotic Note  Leslie Houston is a 71 y.o. female admitted on 03/14/2022 with UTI.  Pharmacy has been consulted for cefepime dosing.  Plan: Give Cefepime 2g IV every 24 hours. Continue to monitor and dose adjust antibiotics according to renal function and indication  Weight: 85.3 kg (188 lb)  Temp (24hrs), Avg:98.2 F (36.8 C), Min:97.7 F (36.5 C), Max:98.7 F (37.1 C)  Recent Labs  Lab 03/14/22 0800 03/14/22 0853  WBC 12.9*  --   CREATININE 2.96*  --   LATICACIDVEN  --  0.8    Estimated Creatinine Clearance: 17 mL/min (A) (by C-G formula based on SCr of 2.96 mg/dL (H)).    Allergies  Allergen Reactions   Latex Rash   Penicillin G Itching   Atorvastatin Other (See Comments)    Myalgias    Other Hives    Adhesive tape   Amlodipine Swelling    Leg swelling   Doxycycline     Blood in urine    Antimicrobials this admission: 11/15 Cefepime >>    Microbiology results: 11/15 BCx: sent 11/15 UCx: sent  Thank you for allowing pharmacy to be a part of this patient's care.  Darrick Penna 03/14/2022 12:59 PM

## 2022-03-14 NOTE — ED Notes (Signed)
Pt's assistant care taker [name] talking with family at bedside over the phone; family wants provider to update [name] with specific details. [phone]. EDP Mumma notified via secure chat.

## 2022-03-14 NOTE — ED Notes (Signed)
Floor RN requesting new code status be entered by attending before pt received to floor. Attending notified via secure chat. Attending states is working on orders now.

## 2022-03-14 NOTE — Consult Note (Addendum)
Urology Consult  I have been asked to see the patient by Dr. Francine Graven, for evaluation and management of abdominal pain.  Chief Complaint: Abdominal pain, nausea, vomiting, constipation  History of Present Illness: Leslie Houston is a 71 y.o. year old female with PMH chronic urinary retention and severe bilateral hydronephrosis who underwent pelvic exam under anesthesia with SPT placement and urethral dilation with Dr. Erlene Quan 2 days ago with intraoperative findings of urethral stricture.  She presented to the ED this morning with reports of 1 day of worsening abdominal pain, nausea, vomiting, and constipation.  She has been afebrile, VSS.  Admission labs notable for creatinine 2.96, at baseline; leukocytosis of 12.9; stable hemoglobin, 11.1; lactate 0.8; and UA with >50 RBCs/hpf, >50 WBCs/hpf, rare bacteria, 11-20 squamous epithelial cells/LPF, WBC clumps, and non-squamous epithelial cells.  Urine and blood cultures pending.  She has been started on antibiotics as below.  CTAP without contrast revealed dilated small bowel loops suggestive of ileus versus partial SBO with apparent bowel tethering to the uterus/right adnexa.  Urethral and suprapubic catheters are appropriately positioned within the bladder lumen with mild paravesical stranding.  There is interval improvement in her bilateral hydronephrosis since catheter placement.  Today she reports she is primarily bothered by lower abdominal pain as well as her inability to have a bowel movement and nausea/vomiting.  She is accompanied today by her daughter at the bedside.  Anti-infectives (From admission, onward)    Start     Dose/Rate Route Frequency Ordered Stop   03/15/22 1000  ceFEPIme (MAXIPIME) 2 g in sodium chloride 0.9 % 100 mL IVPB        2 g 200 mL/hr over 30 Minutes Intravenous Every 24 hours 03/14/22 1301     03/14/22 0815  vancomycin (VANCOCIN) IVPB 1000 mg/200 mL premix        1,000 mg 200 mL/hr over 60 Minutes Intravenous   Once 03/14/22 0810 03/14/22 0950   03/14/22 0815  ceFEPIme (MAXIPIME) 2 g in sodium chloride 0.9 % 100 mL IVPB        2 g 200 mL/hr over 30 Minutes Intravenous  Once 03/14/22 0810 03/14/22 0925        Past Medical History:  Diagnosis Date   (HFpEF) heart failure with preserved ejection fraction ([place])    a. Pt reports in 2000 she had CHF, details unclear, outside hospital; b. 06/2019 Echo: EF 50-55%, no rwma, Gr2 DD,  mildly dil LA, mild MR.   Acute left PCA stroke (HCC) 06/22/2014   Anxiety    a.) on BZO (alprazolam) PRN   CAD (coronary artery disease)    a. Pt reports in 2000 she had a stent to a coronary artery, details unclear, outside hospital.   Carotid stenosis    a. CT angio head 07/2014 - 50-60% stenoses of prox ICA bilaterally.   CKD (chronic kidney disease), stage IV ([place])    Depression    Diabetes mellitus ([place])    Denies.   Edentulous    No upper teeth   Family hx of colon cancer 06/13/2018   Father diagnosed in his 23's   Gout    History of cataract extraction, left 03/21/2021   HOH (hard of hearing)    usually needs to read lips   Hyperlipidemia    Hypertension    Intellectual disability    a.) born premature, limited formal education, has never driven   Myalgia due to statin    Orthostatic hypotension    OSA (obstructive  sleep apnea)    a.) not on nocturnal PAP therapy; old/nonfuntioning DME - referred for repeat PSG 01/2022 by neurology   Osteopenia 01/24/2018   DEXA Sept 2019   PONV (postoperative nausea and vomiting)    PTSD (post-traumatic stress disorder)        PUD (peptic ulcer disease)    PVC's (premature ventricular contractions)    Right sided weakness 05/2014   a.) s/p CVA   Seizures (HCC)    Sinus bradycardia    Syncope    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)     Past Surgical History:  Procedure Laterality Date   BLADDER SURGERY     CATARACT EXTRACTION W/PHACO Left 03/21/2021   Procedure: CATARACT EXTRACTION PHACO  AND INTRAOCULAR LENS PLACEMENT ([place]) left 5.86 00:38.0;  Surgeon: Birder Robson, MD;  Location: [facility];  Service: Ophthalmology;  Laterality: Left;  Latex   CESAREAN SECTION     CORONARY ANGIOPLASTY WITH STENT PLACEMENT     CYSTOSCOPY N/A 03/12/2022   Procedure: CYSTOSCOPY;  Surgeon: [name], MD;  Location: [facility];  Service: Urology;  Laterality: N/A;   DENTAL SURGERY     INSERTION OF SUPRAPUBIC CATHETER N/A 03/12/2022   Procedure: INSERTION OF SUPRAPUBIC CATHETER;  Surgeon: [name], MD;  Location: [facility];  Service: Urology;  Laterality: N/A;   RECTAL EXAM UNDER ANESTHESIA N/A 03/12/2022   Procedure: PELVIC EXAM UNDER ANESTHESIA;  Surgeon: [name], MD;  Location: [facility];  Service: Urology;  Laterality: N/A;    Home Medications:  No outpatient medications have been marked as taking for the 03/14/22 encounter [facility] Encounter).    Allergies:  Allergies  Allergen Reactions   Latex Rash   Penicillin G Itching   Atorvastatin Other (See Comments)    Myalgias    Other Hives    Adhesive tape   Amlodipine Swelling    Leg swelling   Doxycycline     Blood in urine    Family History  Problem Relation Age of Onset   Stroke Mother    Heart attack Mother    Lung cancer Mother    Stroke Father    Prostate cancer Father    Colon cancer Father    Diabetes Mellitus II Daughter    Multiple sclerosis Daughter    Kidney disease Son    Heart failure Son    Cancer Paternal Grandfather     Social History:  reports that she quit smoking about 7 years ago. Her smoking use included cigarettes. She has never used smokeless tobacco. She reports that she does not drink alcohol and does not use drugs.  ROS: A complete review of systems was performed.  All systems are negative except for pertinent findings as noted.  Physical Exam:  Vital signs in last 24 hours: Temp:  [97.7 F (36.5 C)-98.7 F (37.1 C)] 97.7 F (36.5 C) (11/15 1246) Pulse  Rate:  [58-63] 62 (11/15 1246) Resp:  [15-22] 16 (11/15 1246) BP: (117-153)/(51-75) 118/55 (11/15 1246) SpO2:  [90 %-98 %] 98 % (11/15 1246) Weight:  [85.3 kg] 85.3 kg (11/15 0808) Constitutional:  Alert and oriented, no acute distress HEENT: [place] AT, moist mucus membranes Cardiovascular: No clubbing, cyanosis, or edema Respiratory: Normal respiratory effort GI: Abdomen is distended, tender with guarding GU: Suprapubic catheter and urethral Foley catheter are both in place and clamped Skin: No rashes, bruises or suspicious lesions Neurologic: Grossly intact, no focal deficits, moving all 4 extremities Psychiatric: Normal mood and affect  Laboratory Data:  Recent Labs    03/14/22 0800  WBC 12.9*  HGB 11.1*  HCT 34.5*   Recent Labs    03/14/22 0800  NA 142  K 3.6  CL 111  CO2 21*  GLUCOSE 105*  BUN 39*  CREATININE 2.96*  CALCIUM 9.5   Recent Labs    03/14/22 0854  INR 1.4*   Urinalysis    Component Value Date/Time   COLORURINE YELLOW (A) 03/14/2022 0853   APPEARANCEUR CLOUDY (A) 03/14/2022 0853   APPEARANCEUR Cloudy (A) 02/28/2022 1125   LABSPEC 1.012 03/14/2022 0853   LABSPEC 1.017 08/19/2014 1205   PHURINE 5.0 03/14/2022 0853   GLUCOSEU NEGATIVE 03/14/2022 0853   GLUCOSEU Negative 08/19/2014 1205   HGBUR LARGE (A) 03/14/2022 0853   BILIRUBINUR NEGATIVE 03/14/2022 0853   BILIRUBINUR Negative 02/28/2022 1125   BILIRUBINUR Negative 08/19/2014 [place] 03/14/2022 0853   PROTEINUR 100 (A) 03/14/2022 0853   UROBILINOGEN 0.2 11/15/2014 1245   NITRITE NEGATIVE 03/14/2022 0853   LEUKOCYTESUR LARGE (A) 03/14/2022 0853   LEUKOCYTESUR 1+ 08/19/2014 1205   Results for orders placed or performed during the hospital encounter of 03/14/22  Resp Panel by RT-PCR (Flu A&B, Covid) Anterior Nasal Swab     Status: None   Collection Time: 03/14/22  8:53 AM   Specimen: Anterior Nasal Swab  Result Value Ref Range Status   SARS Coronavirus 2 by RT PCR NEGATIVE  NEGATIVE Final    Comment: (NOTE) SARS-CoV-2 target nucleic acids are NOT DETECTED.  The SARS-CoV-2 RNA is generally detectable in upper respiratory specimens during the acute phase of infection. The lowest concentration of SARS-CoV-2 viral copies this assay can detect is 138 copies/mL. A negative result does not preclude SARS-Cov-2 infection and should not be used as the sole basis for treatment or other patient management decisions. A negative result may occur with  improper specimen collection/handling, submission of specimen other than nasopharyngeal swab, presence of viral mutation(s) within the areas targeted by this assay, and inadequate number of viral copies(<138 copies/mL). A negative result must be combined with clinical observations, patient history, and epidemiological information. The expected result is Negative.  Fact Sheet for Patients:  EntrepreneurPulse.com.au  Fact Sheet for Healthcare Providers:  IncredibleEmployment.be  This test is no t yet approved or cleared by the [place] FDA and  has been authorized for detection and/or diagnosis of SARS-CoV-2 by FDA under an Emergency Use Authorization (EUA). This EUA will remain  in effect (meaning this test can be used) for the duration of the COVID-19 declaration under Section 564(b)(1) of the Act, 21 U.S.C.section 360bbb-3(b)(1), unless the authorization is terminated  or revoked sooner.       Influenza A by PCR NEGATIVE NEGATIVE Final   Influenza B by PCR NEGATIVE NEGATIVE Final    Comment: (NOTE) The Xpert Xpress SARS-CoV-2/FLU/RSV plus assay is intended as an aid in the diagnosis of influenza from Nasopharyngeal swab specimens and should not be used as a sole basis for treatment. Nasal washings and aspirates are unacceptable for Xpert Xpress SARS-CoV-2/FLU/RSV testing.  Fact Sheet for Patients: EntrepreneurPulse.com.au  Fact Sheet for Healthcare  Providers: IncredibleEmployment.be  This test is not yet approved or cleared by the [place] FDA and has been authorized for detection and/or diagnosis of SARS-CoV-2 by FDA under an Emergency Use Authorization (EUA). This EUA will remain in effect (meaning this test can be used) for the duration of the COVID-19 declaration under Section 564(b)(1) of the Act, 21 U.S.C.  section 360bbb-3(b)(1), unless the authorization is terminated or revoked.  Performed at [facility], [address] [place], [place] [address]     Radiologic Imaging: CT ABDOMEN PELVIS WO CONTRAST  Result Date: 03/14/2022 CLINICAL DATA:  Abdominal pain, nausea, and vomiting. Status post recent suprapubic catheter placement 03/12/2022. Patient denies hysterectomy. EXAM: CT ABDOMEN AND PELVIS WITHOUT CONTRAST TECHNIQUE: Multidetector CT imaging of the abdomen and pelvis was performed following the standard protocol without IV contrast. RADIATION DOSE REDUCTION: This exam was performed according to the departmental dose-optimization program which includes automated exposure control, adjustment of the mA and/or kV according to patient size and/or use of iterative reconstruction technique. COMPARISON:  CT abdomen and pelvis dated 08/18/2014 FINDINGS: Lower chest: Bilateral lower lobe subsegmental atelectasis, right-greater-than-left. No pleural effusion or pneumothorax demonstrated. Coronary artery calcifications. No pericardial effusion. Hepatobiliary: No focal hepatic lesions. No intra or extrahepatic biliary ductal dilation. Normal gallbladder. Pancreas: No focal lesions or main ductal dilation. Spleen: Normal in size without focal abnormality. Adrenals/Urinary Tract: No adrenal nodules. Atrophic kidneys with mild residual hydronephrosis and tortuous ureters demonstrating thickened urothelium. No suspicious focal renal masses by noncontrast technique. No calculi. The urinary bladder is decompressed  with a Foley and suprapubic catheters in-situ. The suprapubic catheter insertion site in the anterior abdominal wall closely abuts a loop of decompressed small bowel. Mild pericystic stranding. Stomach/Bowel: Normal appearance of the stomach. A few dilated loops of proximal small bowel within the left hemiabdomen to the level of the midline umbilical level, where there is gradual caliber transition. These bowel loops appear to tether to the uterus in the right adnexa. Normal appendix. Vascular/Lymphatic: Aortic atherosclerosis. no enlarged abdominal or pelvic lymph nodes. Reproductive: The uterus is deviated towards the right adnexa. Apparent tethering of multiple small bowel loops as described. No left adnexal mass. Other: No free fluid, fluid collection, or free air. Musculoskeletal: No acute or abnormal lytic or blastic osseous lesions. Multilevel degenerative changes of the partially imaged thoracic and lumbar spine. IMPRESSION: 1. A few dilated loops of proximal small bowel within the left hemiabdomen gradually taper to the level of the right lower quadrant, where these bowel loops appear to tether to the uterus in the right adnexa. Findings are suggestive of ileus versus partial small bowel obstruction. Consider pelvic ultrasound examination for further evaluation of the uterus and right adnexa and etiology of tethered appearance of the bowel loops. Per clinical record, the patient may be unable to tolerate a transvaginal examination. Given patient body habitus and urinary catheters, recommend instilling saline into the bladder to improve bladder distention and transabdominal sonographic window. 2. Mild pericystic stranding. Correlate with urinalysis for cystitis. 3. 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 4.  Aortic Atherosclerosis (ICD10-I70.0). These results were called by telephone at  the time of interpretation on 03/14/2022 at 10:25 am to provider [facility] , who verbally acknowledged these results. Electronically Signed   By: Darrin Nipper M.D.   On: 03/14/2022 10:37   DG Chest Port 1 View  Result Date: 03/14/2022 CLINICAL DATA:  Questionable sepsis - evaluate for abnormality EXAM: PORTABLE CHEST 1 VIEW COMPARISON:  Radiograph 01/31/2022 FINDINGS: Unchanged cardiomediastinal silhouette. There is bandlike atelectasis in the left lower lung. There is no airspace consolidation. There is no pleural effusion or pneumothorax. There is no acute osseous abnormality. IMPRESSION: No focal airspace consolidation. Bandlike atelectasis in the left lower lung. Electronically Signed   By: Ileene Patrick.D.  On: 03/14/2022 08:21    Assessment & Plan:  72 year old female with chronic urinary retention and urethral stricture POD 2 from pelvic exam under anesthesia with suprapubic catheter placement and urethral dilation now admitted with possible UTI versus urosepsis and ileus versus partial SBO.  No abdominal free fluid on imaging to suggest bowel injury. Leukocytosis anticipated given postoperative status. Suprapubic tenderness normal with new SPT tract; reassured the patient that her pain will improve as the tract matures.  No indication for urgent urologic intervention at this time.  Urethral and suprapubic catheters are appropriately positioned on imaging.  Please unclamp both catheters once follow up imaging is performed for maximal urinary decompression.  Agree with antibiotics for management of possible urinary tract infection and agree with general surgery involvement for management of ileus versus partial SBO.  Thank you for involving me in this patient's care, I will continue to follow along.  Debroah Loop, PA-C 03/14/2022 1:14 PM

## 2022-03-14 NOTE — Sepsis Progress Note (Signed)
eLink is following this Code Sepsis. °

## 2022-03-14 NOTE — ED Notes (Signed)
Assessed emesis bag with vomit from before this RN received pt today as daughter at bedside requested this RN assess it. Yellow bile noted. Pt has not vomited since and denies nausea currently. [place] staff called this RN requesting urine cath line be clamped to help fill bladder for imaging. Done; pt educated about why line clamped; pt agreeable to either use call bell to notify this RN or daughter will notify staff when pt feels that her bladder is full.

## 2022-03-14 NOTE — ED Notes (Signed)
Pt away at imaging. Visitor remains at bedside.

## 2022-03-14 NOTE — H&P (Signed)
History and Physical    Patient: Leslie Houston LKG:[id number] DOB: 30-Sep-1950 DOA: 03/14/2022 DOS: the patient was seen and examined on 03/14/2022 PCP: Teodora Medici, DO  Patient coming from: Home  Chief Complaint:  Chief Complaint  Patient presents with   Abdominal Pain   HPI: Leslie Houston is a 71 y.o. female with medical history significant for diabetes mellitus with complications of stage IV chronic kidney disease, depression, history of prior CVA with residual hearing loss, coronary artery disease, anxiety disorder, status post recent suprapubic catheter placement for urinary retention who presents to the emergency room via EMS for evaluation of severe abdominal pain which started on the day of admission associated with nausea and emesis. Most of the history was obtained from the daughter at the bedside who states that patient had a procedure done on 03/12/22, she had a suprapubic catheter insertion for urinary retention and a ureteral catheter was also placed and according to her she was told it was instilled with antibiotics.  Her mother had done well until 1 day prior to admission when she developed nausea, vomiting and abdominal pain which has worsened over the last 24 hours.  Her last bowel movement was 3 days prior to admission and she normally has a daily bowel movement.  Patient is also distended.  She is able to pass flatus. She denies having any chest pain, no shortness of breath, no dizziness, no lightheadedness, no leg swelling, no headache, no blurred vision or focal deficit. Per EMS she had a low-grade temp of 100.6 and was hypotensive with systolic blood pressure in the 80s. Labs show a white count of 12.9 with pyuria CT scan of abdomen and pelvis shows  Narrative & Impression  A few dilated loops of proximal small bowel within the left hemiabdomen gradually taper to the level of the right lower quadrant, where these bowel loops appear to tether to the uterus  in the right adnexa. Findings are suggestive of ileus versus partial small bowel obstruction. Consider pelvic ultrasound examination for further evaluation of the uterus and right adnexa and etiology of tethered appearance of the bowel loops. Per clinical record, the patient may be unable to tolerate a transvaginal examination. Given patient body habitus and urinary catheters, recommend instilling saline into the bladder to improve bladder distention and transabdominal sonographic window.Mild pericystic stranding. Correlate with urinalysis for cystitis. 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 Aortic Atherosclerosis   She received a 500 cc bolus of LR, a dose of cefepime and vancomycin in the ER and will be admitted to the hospital for further evaluation.   Review of Systems: As mentioned in the history of present illness. All other systems reviewed and are negative. Past Medical History:  Diagnosis Date   (HFpEF) heart failure with preserved ejection fraction ([name])    a. Pt reports in 2000 she had CHF, details unclear, outside hospital; b. 06/2019 Echo: EF 50-55%, no rwma, Gr2 DD,  mildly dil LA, mild MR.   Acute left PCA stroke (HCC) 06/22/2014   Anxiety    a.) on BZO (alprazolam) PRN   CAD (coronary artery disease)    a. Pt reports in 2000 she had a stent to a coronary artery, details unclear, outside hospital.   Carotid stenosis    a. CT angio head 07/2014 - 50-60% stenoses of prox ICA bilaterally.   CKD (chronic kidney disease), stage IV (HCC)    Depression    Diabetes mellitus (  [place])    Denies.   Edentulous    No upper teeth   Family hx of colon cancer 06/13/2018   Father diagnosed in his 36's   Gout    History of cataract extraction, left 03/21/2021   HOH (hard of hearing)    usually needs to read lips   Hyperlipidemia    Hypertension    Intellectual  disability    a.) born premature, limited formal education, has never driven   Myalgia due to statin    Orthostatic hypotension    OSA (obstructive sleep apnea)    a.) not on nocturnal PAP therapy; old/nonfuntioning DME - referred for repeat PSG 01/2022 by neurology   Osteopenia 01/24/2018   DEXA Sept 2019   PONV (postoperative nausea and vomiting)    PTSD (post-traumatic stress disorder)        PUD (peptic ulcer disease)    PVC's (premature ventricular contractions)    Right sided weakness 05/2014   a.) s/p CVA   Seizures ([place])    Sinus bradycardia    Syncope    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)    Past Surgical History:  Procedure Laterality Date   BLADDER SURGERY     CATARACT EXTRACTION W/PHACO Left 03/21/2021   Procedure: CATARACT EXTRACTION PHACO AND INTRAOCULAR LENS PLACEMENT ([place]) left 5.86 00:38.0;  Surgeon: Birder Robson, MD;  Location: [facility];  Service: Ophthalmology;  Laterality: Left;  Latex   CESAREAN SECTION     CORONARY ANGIOPLASTY WITH STENT PLACEMENT     CYSTOSCOPY N/A 03/12/2022   Procedure: CYSTOSCOPY;  Surgeon: [name], MD;  Location: [facility];  Service: Urology;  Laterality: N/A;   DENTAL SURGERY     INSERTION OF SUPRAPUBIC CATHETER N/A 03/12/2022   Procedure: INSERTION OF SUPRAPUBIC CATHETER;  Surgeon: [name], MD;  Location: [facility];  Service: Urology;  Laterality: N/A;   RECTAL EXAM UNDER ANESTHESIA N/A 03/12/2022   Procedure: PELVIC EXAM UNDER ANESTHESIA;  Surgeon: [name], MD;  Location: [facility];  Service: Urology;  Laterality: N/A;   Social History:  reports that she quit smoking about 7 years ago. Her smoking use included cigarettes. She has never used smokeless tobacco. She reports that she does not drink alcohol and does not use drugs.  Allergies  Allergen Reactions   Latex Rash   Penicillin G Itching   Atorvastatin Other (See Comments)    Myalgias    Other Hives    Adhesive  tape   Amlodipine Swelling    Leg swelling   Doxycycline     Blood in urine    Family History  Problem Relation Age of Onset   Stroke Mother    Heart attack Mother    Lung cancer Mother    Stroke Father    Prostate cancer Father    Colon cancer Father    Diabetes Mellitus II Daughter    Multiple sclerosis Daughter    Kidney disease Son    Heart failure Son    Cancer Paternal Grandfather     Prior to Admission medications   Medication Sig Start Date End Date Taking? Authorizing Provider  ALPRAZolam Duanne Moron) 1 MG tablet Take 1-2 tablets 30 minutes prior to MRI, may repeat once as needed. Must have driver. 02/15/22   Marcial Pacas, MD  ASPIRIN LOW DOSE 81 MG EC tablet TAKE 1 TABLET BY MOUTH ONCE DAILY *SWALLOW WHOLE* 08/19/19   Suzzanne Cloud, NP  benzonatate (TESSALON PERLES) 100 MG capsule Take  1 capsule (100 mg total) by mouth 3 (three) times daily as needed for cough. Patient not taking: Reported on 03/12/2022 01/31/22 01/31/23  Teodoro Spray, PA  Cholecalciferol (VITAMIN D3) 25 MCG (1000 UT) CAPS Take 1 capsule by mouth daily.    [provider]  citalopram (CELEXA) 20 MG tablet Take 1 tablet (20 mg total) by mouth daily. 10/10/21   Teodora Medici, DO  ezetimibe (ZETIA) 10 MG tablet TAKE 1 TABLET BY MOUTH ONCE A DAY 04/26/21   Mecum, Erin E, PA-C  ferrous sulfate 325 (65 FE) MG tablet Take 325 mg by mouth daily with breakfast.    [provider]  fluticasone (FLONASE) 50 MCG/ACT nasal spray Place 2 sprays into both nostrils daily as needed for allergies (allergies).     [provider]  hydrALAZINE (APRESOLINE) 10 MG tablet TAKE 1 TABLET BY MOUTH TWICE A DAY Patient taking differently: Take 10 mg by mouth daily. 04/25/21   Minna Merritts, MD  levETIRAcetam (KEPPRA) 1000 MG tablet Take 1 tablet (1,000 mg total) by mouth 2 (two) times daily. 02/15/22   Marcial Pacas, MD  loratadine (CLARITIN) 10 MG tablet TAKE 1 TABLET BY MOUTH ONCE A DAY AS NEEDED  FOR ALLERGIES 04/26/21   Mecum, Erin E, PA-C  pantoprazole (PROTONIX) 20 MG tablet TAKE 1 TABLET BY MOUTH ONCE A DAY 12/28/21   Teodora Medici, DO  tamsulosin (FLOMAX) 0.4 MG CAPS capsule Take 2 capsules (0.8 mg total) by mouth daily. Take one in the morning and one at night. Patient taking differently: Take 0.4 mg by mouth 2 (two) times daily. Take one in the morning and one at night. 01/18/22   Vaillancourt, Aldona Bar, PA-C  torsemide (DEMADEX) 20 MG tablet TAKE 1 TABLET BY MOUTH ONCE DAILY 10/26/21   Minna Merritts, MD  Vitamin D, Ergocalciferol, (DRISDOL) 1.25 MG (50000 UNIT) CAPS capsule Take 1 capsule (50,000 Units total) by mouth every 7 (seven) days. 12/06/21   Teodora Medici, DO    Physical Exam: Vitals:   03/14/22 1115 03/14/22 1130 03/14/22 1200 03/14/22 1246  BP:  (!) 141/57 (!) 122/51 (!) 118/55  Pulse: 60 62 (!) 58 62  Resp: '19 15 20 16  '$ Temp:    97.7 F (36.5 C)  SpO2: 97% 97% 97% 98%  Weight:       Physical Exam Vitals and nursing note reviewed.  Constitutional:      Appearance: She is well-developed.     Comments: Appears uncomfortable  HENT:     Head: Normocephalic and atraumatic.     Mouth/Throat:     Comments: Dry mucous membranes Cardiovascular:     Rate and Rhythm: Normal rate and regular rhythm.  Pulmonary:     Effort: Pulmonary effort is normal.     Breath sounds: Normal breath sounds.  Abdominal:     General: Bowel sounds are decreased. There is distension.     Tenderness: There is generalized abdominal tenderness and tenderness in the suprapubic area.  Genitourinary:    Comments: Patient has a suprapubic as well as ureteral catheter in place Skin:    General: Skin is warm and dry.  Neurological:     Mental Status: She is alert and oriented to person, place, and time.     Motor: Weakness present.  Psychiatric:        Mood and Affect: Mood normal.        Behavior: Behavior normal.     Data Reviewed: Relevant notes from primary care and  specialist visits, past discharge summaries as available in EHR, including Care Everywhere. Prior diagnostic testing as pertinent to current admission diagnoses Updated medications and problem lists for reconciliation ED course, including vitals, labs, imaging, treatment and response to treatment Triage notes, nursing and pharmacy notes and ED provider's notes Notable results as noted in HPI Labs reviewed.  Lactic acid 0.8, PT 16.7, INR 1.4, sodium 142, potassium 3.6, chloride 111, bicarb 21, glucose 105, BUN 39, creatinine 2.96, calcium 9.5, total protein 7.5, albumin 3.3, AST 16, ALT 9, alkaline phosphatase 88, total bilirubin 0.6, white count 12.9, hemoglobin 11.1, hematocrit 34.5, platelet 193 Respiratory viral panel is negative Chest x-ray reviewed by me shows No focal airspace consolidation. Bandlike atelectasis in the left lower lung. Twelve-lead EKG reviewed by me shows sinus rhythm There are no new results to review at this time.  Assessment and Plan: * Partial small bowel obstruction ([place]) Patient presents for evaluation of diffuse abdominal pain but mostly in the suprapubic area associated with abdominal distention, nausea, vomiting and constipation CT scan of abdomen and pelvis showed a few dilated loops of proximal small bowel within the left hemi abdomen gradually taper to the level of the right lower quadrant, where these bowel loops appear to tether to the uterus in the right adnexa.  Findings are suggestive of ileus versus partial small bowel obstruction. Keep patient n.p.o. for now Supportive care with pain control, IV fluids, IV PPI and antiemetics Consult general surgery  UTI (urinary tract infection) Patient is status post recent suprapubic catheter placement as well as ureteral catheter insertion for urinary retention She presents for evaluation of abdominal pain mostly in the suprapubic area and has pyuria We will place patient on cefepime due to recent  instrumentation Follow-up results of urine culture  Obesity (BMI 30-39.9) BMI 50.93 Complicates overall prognosis and care Lifestyle modification and exercise has been discussed with patient  Urinary retention Status post recent suprapubic and ureteral catheterization  Plan was to leave ureteral Foley in for 1 week to allow urethral healing with a patent tract. We will consult urology for evaluation of severe suprapubic pain  Seizures ([place]) We will place patient on IV Keppra Seizure precautions  CKD (chronic kidney disease) stage 4, GFR 15-29 ml/min (HCC) Patient has stage IV chronic kidney disease which appears stable.   Monitor renal function closely  Hemiparesis affecting right side as late effect of cerebrovascular accident (CVA) ([place]) History of prior CVA with left-sided hemiparesis Resume aspirin once patient is able to tolerate p.o.      Advance Care Planning:   Code Status: Full Code   Consults: Urology, general surgery  Family Communication: Greater than 50% of time was spent discussing patient's condition and plan of care with her and her daughter at the bedside.  All questions and concerns have been addressed.  She verbalizes understanding and agrees to the plan.  Severity of Illness: The appropriate patient status for this patient is INPATIENT. Inpatient status is judged to be reasonable and necessary in order to provide the required intensity of service to ensure the patient's safety. The patient's presenting symptoms, physical exam findings, and initial radiographic and laboratory data in the context of their chronic comorbidities is felt to place them at high risk for further clinical deterioration. Furthermore, it is not anticipated that the patient will be medically stable for discharge from the hospital within 2 midnights of admission.   * I certify that at the point of admission it is my clinical judgment that  the patient will require inpatient hospital care  spanning beyond 2 midnights from the point of admission due to high intensity of service, high risk for further deterioration and high frequency of surveillance required.*  Author: Collier Bullock, MD 03/14/2022 12:49 PM  For on call review [email or web address].

## 2022-03-14 NOTE — Assessment & Plan Note (Addendum)
Presented with diffuse abdominal pain, N/V and constipation.  CT scan of abdomen and pelvis showed a few dilated loops of proximal small bowel within the left hemi abdomen gradually taper to the level of the right lower quadrant, where these bowel loops appear to tether to the uterus in the right adnexa.  Findings are suggestive of ileus versus partial small bowel obstruction. --General surgery following --NPO except ice chips & sips w meds --NG tube placed 11/16, on LIWS --Serial KUB's --Monitor abdominal exam closely --PRN antiemetics & pain control --Gentle maintenance IV fluids

## 2022-03-14 NOTE — ED Notes (Signed)
IVF, vanc and cefepime infusing. Nasal swab, blood and urine sent. Ready for CT. Daughter at [facility]. EDP at [facility].

## 2022-03-14 NOTE — Assessment & Plan Note (Addendum)
BMI 78.24 Complicates overall prognosis and care Lifestyle modification and exercise has been discussed with patient.

## 2022-03-14 NOTE — ED Triage Notes (Signed)
BIB [facility] from home for abd pain and NV, started vomiting bile last night, last meds yesterday evening, EMS called by daughter, Suprapubic cath placed on Monday, placed on  2L for SPO2 90%, RR30, temp 100.6, BS 128. A&O on arrival. EDP present on arrival.

## 2022-03-14 NOTE — Assessment & Plan Note (Addendum)
Status post recent suprapubic and ureteral catheterization on 11/13 with Dr. Erlene Quan. Plan was to leave ureteral Foley in for 1 week to allow urethral healing with a patent tract. We will consult urology for evaluation of severe suprapubic pain. --Urology is following --Foley removed by Dr. Erlene Quan 11/20 --Suprapubic catheter to remain in place --Outpatient urology follow up as scheduled

## 2022-03-14 NOTE — Consult Note (Signed)
[facility] SURGICAL CONSULTATION NOTE (initial) - cpt: [id number]   HISTORY OF PRESENT ILLNESS (HPI):  71 y.o. female presented to [facility] ED today for evaluation of abdominal pain. Patient reports the acute onset of abdominal pain yesterday . This started in her lower abdomen but radiated throughout her sides and upper abdomen. She reports her belly feeling "tight" as well. This was associated with nausea and emesis. No fever, chills, CP, SOB. Unsure when he last BM was. Of note, she did undergo suprapubic catheter placement on 11/13 with Dr Erlene Quan. Previous intra-abdominal surgery is positive for C-section. Work up in the ED revealed a mild leukocytosis to 12.9K, sCr is baseline, no electrolyte derangements, lactic acid level was normal. She did undergo CT Abdomen/Pelvis which showed a few dilated loops of small bowe without obvious transition. She was ultimately admitted to the medicine service.   Surgery is consulted by hospitalist physician Dr. Francine Graven, MD in this context for evaluation and management of possible SBO.  At the time of my evaluation, patient reported he discomfort was somewhat improved and was no longer with nausea or emesis. She denied any flatus but daughter reports her mother will hold flatus out of embarrassment.   PAST MEDICAL HISTORY (PMH):  Past Medical History:  Diagnosis Date   (HFpEF) heart failure with preserved ejection fraction ([place])    a. Pt reports in 2000 she had CHF, details unclear, outside hospital; b. 06/2019 Echo: EF 50-55%, no rwma, Gr2 DD,  mildly dil LA, mild MR.   Acute left PCA stroke (HCC) 06/22/2014   Anxiety    a.) on BZO (alprazolam) PRN   CAD (coronary artery disease)    a. Pt reports in 2000 she had a stent to a coronary artery, details unclear, outside hospital.   Carotid stenosis    a. CT angio head 07/2014 - 50-60% stenoses of prox ICA bilaterally.   CKD (chronic kidney disease), stage IV ([place])    Depression    Diabetes mellitus  ([place])    Denies.   Edentulous    No upper teeth   Family hx of colon cancer 06/13/2018   Father diagnosed in his 63's   Gout    History of cataract extraction, left 03/21/2021   HOH (hard of hearing)    usually needs to read lips   Hyperlipidemia    Hypertension    Intellectual disability    a.) born premature, limited formal education, has never driven   Myalgia due to statin    Orthostatic hypotension    OSA (obstructive sleep apnea)    a.) not on nocturnal PAP therapy; old/nonfuntioning DME - referred for repeat PSG 01/2022 by neurology   Osteopenia 01/24/2018   DEXA Sept 2019   PONV (postoperative nausea and vomiting)    PTSD (post-traumatic stress disorder)        PUD (peptic ulcer disease)    PVC's (premature ventricular contractions)    Right sided weakness 05/2014   a.) s/p CVA   Seizures ([place])    Sinus bradycardia    Syncope    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)      PAST SURGICAL HISTORY ([place]):  Past Surgical History:  Procedure Laterality Date   BLADDER SURGERY     CATARACT EXTRACTION W/PHACO Left 03/21/2021   Procedure: CATARACT EXTRACTION PHACO AND INTRAOCULAR LENS PLACEMENT ([place]) left 5.86 00:38.0;  Surgeon: Birder Robson, MD;  Location: [facility];  Service: Ophthalmology;  Laterality: Left;  Latex   CESAREAN  SECTION     CORONARY ANGIOPLASTY WITH STENT PLACEMENT     CYSTOSCOPY N/A 03/12/2022   Procedure: CYSTOSCOPY;  Surgeon: [name], MD;  Location: [facility];  Service: Urology;  Laterality: N/A;   DENTAL SURGERY     INSERTION OF SUPRAPUBIC CATHETER N/A 03/12/2022   Procedure: INSERTION OF SUPRAPUBIC CATHETER;  Surgeon: [name], MD;  Location: [facility];  Service: Urology;  Laterality: N/A;   RECTAL EXAM UNDER ANESTHESIA N/A 03/12/2022   Procedure: PELVIC EXAM UNDER ANESTHESIA;  Surgeon: [name], MD;  Location: [facility];  Service: Urology;  Laterality: N/A;     MEDICATIONS:  Prior to Admission  medications   Medication Sig Start Date End Date Taking? Authorizing Provider  ALPRAZolam Duanne Moron) 1 MG tablet Take 1-2 tablets 30 minutes prior to MRI, may repeat once as needed. Must have driver. 02/15/22   Marcial Pacas, MD  ASPIRIN LOW DOSE 81 MG EC tablet TAKE 1 TABLET BY MOUTH ONCE DAILY *SWALLOW WHOLE* 08/19/19   Suzzanne Cloud, NP  benzonatate (TESSALON PERLES) 100 MG capsule Take 1 capsule (100 mg total) by mouth 3 (three) times daily as needed for cough. Patient not taking: Reported on 03/12/2022 01/31/22 01/31/23  Teodoro Spray, PA  Cholecalciferol (VITAMIN D3) 25 MCG (1000 UT) CAPS Take 1 capsule by mouth daily.    [provider]  citalopram (CELEXA) 20 MG tablet Take 1 tablet (20 mg total) by mouth daily. 10/10/21   Teodora Medici, DO  ezetimibe (ZETIA) 10 MG tablet TAKE 1 TABLET BY MOUTH ONCE A DAY 04/26/21   Mecum, Erin E, PA-C  ferrous sulfate 325 (65 FE) MG tablet Take 325 mg by mouth daily with breakfast.    [provider]  fluticasone (FLONASE) 50 MCG/ACT nasal spray Place 2 sprays into both nostrils daily as needed for allergies (allergies).     [provider]  hydrALAZINE (APRESOLINE) 10 MG tablet TAKE 1 TABLET BY MOUTH TWICE A DAY Patient taking differently: Take 10 mg by mouth daily. 04/25/21   Minna Merritts, MD  levETIRAcetam (KEPPRA) 1000 MG tablet Take 1 tablet (1,000 mg total) by mouth 2 (two) times daily. 02/15/22   Marcial Pacas, MD  loratadine (CLARITIN) 10 MG tablet TAKE 1 TABLET BY MOUTH ONCE A DAY AS NEEDED FOR ALLERGIES 04/26/21   Mecum, Erin E, PA-C  pantoprazole (PROTONIX) 20 MG tablet TAKE 1 TABLET BY MOUTH ONCE A DAY 12/28/21   Teodora Medici, DO  tamsulosin (FLOMAX) 0.4 MG CAPS capsule Take 2 capsules (0.8 mg total) by mouth daily. Take one in the morning and one at night. Patient taking differently: Take 0.4 mg by mouth 2 (two) times daily. Take one in the morning and one at night. 01/18/22   Vaillancourt, Aldona Bar, PA-C   torsemide (DEMADEX) 20 MG tablet TAKE 1 TABLET BY MOUTH ONCE DAILY 10/26/21   Minna Merritts, MD  Vitamin D, Ergocalciferol, (DRISDOL) 1.25 MG (50000 UNIT) CAPS capsule Take 1 capsule (50,000 Units total) by mouth every 7 (seven) days. 12/06/21   Teodora Medici, DO     ALLERGIES:  Allergies  Allergen Reactions   Latex Rash   Penicillin G Itching   Atorvastatin Other (See Comments)    Myalgias    Other Hives    Adhesive tape   Amlodipine Swelling    Leg swelling   Doxycycline     Blood in urine     SOCIAL HISTORY:  Social History   Socioeconomic History   Marital status: Single  Spouse name: Not on file   Number of children: 2   Years of education: 33   Highest education level: Not on file  Occupational History   Occupation: Disabled  Tobacco Use   Smoking status: Former    Types: Cigarettes    Quit date: 03/30/2014    Years since quitting: 7.9   Smokeless tobacco: Never  Vaping Use   Vaping Use: Never used  Substance and Sexual Activity   Alcohol use: No   Drug use: No   Sexual activity: Not Currently  Other Topics Concern   Not on file  Social History Narrative   Lives at home with her son's father.   Right-handed.   1 cup coffee per day.   Social Determinants of Health   Financial Resource Strain: Medium Risk (10/25/2021)   Overall Financial Resource Strain (CARDIA)    Difficulty of Paying Living Expenses: Somewhat hard  Food Insecurity: Food Insecurity Present (10/25/2021)   Hunger Vital Sign    Worried About Running Out of Food in the Last Year: Often true    Ran Out of Food in the Last Year: Often true  Transportation Needs: No Transportation Needs (10/25/2021)   PRAPARE - Hydrologist (Medical): No    Lack of Transportation (Non-Medical): No  Physical Activity: Insufficiently Active (10/10/2021)   Exercise Vital Sign    Days of Exercise per Week: 3 days    Minutes of Exercise per Session: 20 min  Stress: No Stress  Concern Present (10/10/2021)   [place]    Feeling of Stress : Only a little  Social Connections: Moderately Isolated (10/10/2021)   Social Connection and Isolation Panel [NHANES]    Frequency of Communication with Friends and Family: More than three times a week    Frequency of Social Gatherings with Friends and Family: Three times a week    Attends Religious Services: More than 4 times per year    Active Member of Clubs or Organizations: No    Attends Archivist Meetings: Never    Marital Status: Divorced  Human resources officer Violence: Not At Risk (10/10/2021)   Humiliation, Afraid, Rape, and Kick questionnaire    Fear of Current or Ex-Partner: No    Emotionally Abused: No    Physically Abused: No    Sexually Abused: No     FAMILY HISTORY:  Family History  Problem Relation Age of Onset   Stroke Mother    Heart attack Mother    Lung cancer Mother    Stroke Father    Prostate cancer Father    Colon cancer Father    Diabetes Mellitus II Daughter    Multiple sclerosis Daughter    Kidney disease Son    Heart failure Son    Cancer Paternal Grandfather       REVIEW OF SYSTEMS:  Review of Systems  Constitutional:  Negative for chills and fever.  HENT:  Negative for congestion and sore throat.   Respiratory:  Negative for cough and shortness of breath.   Cardiovascular:  Negative for chest pain and palpitations.  Gastrointestinal:  Positive for abdominal pain, nausea and vomiting. Negative for constipation and diarrhea.  All other systems reviewed and are negative.   VITAL SIGNS:  Temp:  [98.7 F (37.1 C)] 98.7 F (37.1 C) (11/15 0805) Pulse Rate:  [60-63] 62 (11/15 1130) Resp:  [15-22] 15 (11/15 1130) BP: (117-153)/(52-75) 141/57 (11/15 1130) SpO2:  [90 %-  98 %] 97 % (11/15 1130) Weight:  [85.3 kg] 85.3 kg (11/15 0808)       Weight: 85.3 kg BMI (Calculated): 35.54   INTAKE/OUTPUT:  No  intake/output data recorded.  PHYSICAL EXAM:  Physical Exam Vitals and nursing note reviewed. Exam conducted with a chaperone present.  Constitutional:      General: She is not in acute distress.    Appearance: She is well-developed. She is obese. She is not ill-appearing.  HENT:     Head: Normocephalic and atraumatic.  Eyes:     General: No scleral icterus.    Extraocular Movements: Extraocular movements intact.  Cardiovascular:     Rate and Rhythm: Normal rate and regular rhythm.  Pulmonary:     Effort: Pulmonary effort is normal. No respiratory distress.     Comments: On [place] Abdominal:     General: A surgical scar is present. There is no distension.     Palpations: Abdomen is soft.     Tenderness: There is abdominal tenderness in the epigastric area and periumbilical area. There is no guarding or rebound.     Comments: Abdomen is obese, soft, she is primarily tender to the upper abdomen but this is mild, non-distended, no rebound/guarding. She is certainly without peritonitis. Suprapubic catheter present as well.   Genitourinary:    Comments: Foley in place Skin:    General: Skin is warm and dry.     Coloration: Skin is not jaundiced or pale.  Neurological:     General: No focal deficit present.     Mental Status: She is alert and oriented to person, place, and time.  Psychiatric:        Mood and Affect: Mood normal.        Behavior: Behavior normal.      Labs:     Latest Ref Rng & Units 03/14/2022    8:00 AM 01/31/2022   11:00 AM 12/05/2021    1:58 PM  CBC  WBC 4.0 - 10.5 K/uL 12.9  5.1  4.6   Hemoglobin 12.0 - 15.0 g/dL 11.1  11.7  12.9   Hematocrit 36.0 - 46.0 % 34.5  36.9  38.5   Platelets 150 - 400 K/uL 193  230  237       Latest Ref Rng & Units 03/14/2022    8:00 AM 01/31/2022   11:00 AM 01/18/2022    2:44 PM  CMP  Glucose 70 - 99 mg/dL 105  98  90   BUN 8 - 23 mg/dL 39  39  35   Creatinine 0.44 - 1.00 mg/dL 2.96  2.64  2.80   Sodium 135 - 145 mmol/L 142   136  140   Potassium 3.5 - 5.1 mmol/L 3.6  3.6  4.0   Chloride 98 - 111 mmol/L 111  108  102   CO2 22 - 32 mmol/L '21  22  17   '$ Calcium 8.9 - 10.3 mg/dL 9.5  9.1  9.6   Total Protein 6.5 - 8.1 g/dL 7.5  7.6    Total Bilirubin 0.3 - 1.2 mg/dL 0.6  0.5    Alkaline Phos 38 - 126 U/L 88  91    AST 15 - 41 U/L 16  16    ALT 0 - 44 U/L 9  11      Imaging studies:   CT Abdomen/Pelvis (03/14/2022) personally reviewed there is a loop of dilated small bowel centrally, no clear transition zone, no free air, no pneumatosis,  catheters noted, and radiologist report reviewed below:  IMPRESSION: 1. A few dilated loops of proximal small bowel within the left hemiabdomen gradually taper to the level of the right lower quadrant, where these bowel loops appear to tether to the uterus in the right adnexa. Findings are suggestive of ileus versus partial small bowel obstruction. Consider pelvic ultrasound examination for further evaluation of the uterus and right adnexa and etiology of tethered appearance of the bowel loops. Per clinical record, the patient may be unable to tolerate a transvaginal examination. Given patient body habitus and urinary catheters, recommend instilling saline into the bladder to improve bladder distention and transabdominal sonographic window. 2. Mild pericystic stranding. Correlate with urinalysis for cystitis. 3. 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 4.  Aortic Atherosclerosis (ICD10-I70.0).   Assessment/Plan: (ICD-10's: K56.7) 71 y.o. female with abdominal pain and, now resolved, nausea/emesis, found to have dilated loop of small bowel without clear transition concerning for possible SBO. May also represent adynamic ileus in setting of recent procedure.    - She is doing well wright now clinically. I would favor ileus over true obstruction at this  point. Given her lack of nausea emesis currently, I think we can hold off on NGT placement. She, and her daughter, understand that should these return we would recommend placement.   - No role for surgical interventions at this time.    - NPO; okay for ice chips/sips with medications   - Monitor abdominal examination; on-going bowel function  - Pain control prn; antiemetics prn  - Morning KUB  - Appreciate urology input  - Further management per primary service; we will follow   All of the above findings and recommendations were discussed with the patient and her family, and all of patient's and her family's questions were answered to their expressed satisfaction.  Thank you for the opportunity to participate in this patient's care.   -- Edison Simon, PA-C [facility] 03/14/2022, 12:12 PM M-F: 7am - 4pm

## 2022-03-14 NOTE — Assessment & Plan Note (Addendum)
Continue IV Keppra until taking PO. Seizure precautions.

## 2022-03-14 NOTE — ED Notes (Addendum)
Patient denies pain and is resting comfortably. 100cc emptied from urine bag.

## 2022-03-14 NOTE — Assessment & Plan Note (Addendum)
Renal function appears stable, has improved since admission.   Monitor renal function closely. Consult nephrology consult if worsening.

## 2022-03-14 NOTE — Assessment & Plan Note (Addendum)
History of prior CVA with left-sided hemiparesis Resume aspirin once patient is able to tolerate p.o.

## 2022-03-15 ENCOUNTER — Inpatient Hospital Stay: Payer: Medicare Other

## 2022-03-15 DIAGNOSIS — K56609 Unspecified intestinal obstruction, unspecified as to partial versus complete obstruction: Secondary | ICD-10-CM | POA: Diagnosis not present

## 2022-03-15 DIAGNOSIS — R339 Retention of urine, unspecified: Secondary | ICD-10-CM

## 2022-03-15 DIAGNOSIS — N3582 Other urethral stricture, female: Secondary | ICD-10-CM | POA: Diagnosis not present

## 2022-03-15 DIAGNOSIS — R1084 Generalized abdominal pain: Secondary | ICD-10-CM | POA: Diagnosis not present

## 2022-03-15 DIAGNOSIS — K56600 Partial intestinal obstruction, unspecified as to cause: Secondary | ICD-10-CM | POA: Diagnosis not present

## 2022-03-15 DIAGNOSIS — K5669 Other partial intestinal obstruction: Secondary | ICD-10-CM | POA: Diagnosis not present

## 2022-03-15 LAB — URINE CULTURE: Culture: NO GROWTH

## 2022-03-15 LAB — BASIC METABOLIC PANEL
Anion gap: 8 (ref 5–15)
BUN: 37 mg/dL — ABNORMAL HIGH (ref 8–23)
CO2: 21 mmol/L — ABNORMAL LOW (ref 22–32)
Calcium: 9.1 mg/dL (ref 8.9–10.3)
Chloride: 114 mmol/L — ABNORMAL HIGH (ref 98–111)
Creatinine, Ser: 2.74 mg/dL — ABNORMAL HIGH (ref 0.44–1.00)
GFR, Estimated: 18 mL/min — ABNORMAL LOW (ref >60)
Glucose, Bld: 89 mg/dL (ref 70–99)
Potassium: 3.5 mmol/L (ref 3.5–5.1)
Sodium: 143 mmol/L (ref 135–145)

## 2022-03-15 LAB — CBC
HCT: 32 % — ABNORMAL LOW (ref 36.0–46.0)
Hemoglobin: 10.4 g/dL — ABNORMAL LOW (ref 12.0–15.0)
MCH: 33 pg (ref 26.0–34.0)
MCHC: 32.5 g/dL (ref 30.0–36.0)
MCV: 101.6 fL — ABNORMAL HIGH (ref 80.0–100.0)
Platelets: 184 10*3/uL (ref 150–400)
RBC: 3.15 MIL/uL — ABNORMAL LOW (ref 3.87–5.11)
RDW: 13 % (ref 11.5–15.5)
WBC: 9.2 10*3/uL (ref 4.0–10.5)
nRBC: 0 % (ref 0.0–0.2)

## 2022-03-15 MED ORDER — DIATRIZOATE MEGLUMINE & SODIUM 66-10 % PO SOLN
90.0000 mL | Freq: Once | ORAL | Status: AC
  Administered 2022-03-15: 90 mL via ORAL

## 2022-03-15 MED ORDER — PHENOL 1.4 % MT LIQD
1.0000 | OROMUCOSAL | Status: DC | PRN
  Filled 2022-03-15: qty 177

## 2022-03-15 MED ORDER — PROCHLORPERAZINE EDISYLATE 10 MG/2ML IJ SOLN
10.0000 mg | Freq: Four times a day (QID) | INTRAMUSCULAR | Status: DC | PRN
  Administered 2022-03-15 – 2022-03-20 (×4): 10 mg via INTRAVENOUS
  Filled 2022-03-15 (×5): qty 2

## 2022-03-15 MED ORDER — ONDANSETRON HCL 4 MG/2ML IJ SOLN
4.0000 mg | Freq: Once | INTRAMUSCULAR | Status: AC
  Administered 2022-03-15: 4 mg via INTRAVENOUS

## 2022-03-15 NOTE — Hospital Course (Addendum)
Leslie Houston is a 71 y.o. 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  She presented to the ED via EMS on 03/14/2022 for evaluation of severe abdominal pain with associated with nausea and emesis.   Patient was found to have partial SBO vs ileus. She was started on empiric IV antibiotics due to concern for UTI.    Urology and General Surgery are consulted.   11/22: CT abdomen/pelvis with p.o. contrast today per surgery 11/23: CT - small bowel obstruction.  Exploratory laparotomy and lysis of additions 11/24: Postop day 1, attending service changed to surgery. 11/25: NG tube in place 11/26: Patient pulled out NG tube last night and surgical team recommends to leave it out as patient refused 11/27: Patient refusing NG tube, starting TPN 11/28: NG tube reinserted.  KUB shows persistent ileus 11/29: NG remains in place.  Surgery repeating CT scan. 11/30: Persistent post-op ilues.  Started on reglan and increased stool softeners.  KUB ?slight improvement

## 2022-03-15 NOTE — Progress Notes (Signed)
Urology Inpatient Progress Note  Subjective: No acute events overnight.  She is afebrile, VSS. Creatinine down today, 2.74.  WBC count down, 9.2.  Urine culture negative, blood cultures pending.  On antibiotics as below. She underwent transabdominal pelvic ultrasound yesterday with.  There was incomplete distention of the bladder which limited evaluation of the pelvis. No significant findings. Today she reports feeling slightly better compared to yesterday but she continues to report abdominal discomfort.  She states she is passing flatus but had an episode of emesis this morning.   Suprapubic catheter in place, plugged.  Urethral catheter in place draining clear, yellow urine with urinary sediment in the tubing.  Anti-infectives: Anti-infectives (From admission, onward)    Start     Dose/Rate Route Frequency Ordered Stop   03/15/22 1000  ceFEPIme (MAXIPIME) 2 g in sodium chloride 0.9 % 100 mL IVPB        2 g 200 mL/hr over 30 Minutes Intravenous Every 24 hours 03/14/22 1301     03/14/22 0815  vancomycin (VANCOCIN) IVPB 1000 mg/200 mL premix        1,000 mg 200 mL/hr over 60 Minutes Intravenous  Once 03/14/22 0810 03/14/22 0950   03/14/22 0815  ceFEPIme (MAXIPIME) 2 g in sodium chloride 0.9 % 100 mL IVPB        2 g 200 mL/hr over 30 Minutes Intravenous  Once 03/14/22 0810 03/14/22 0925       Current Facility-Administered Medications  Medication Dose Route Frequency Provider Last Rate Last Admin   0.9 %  sodium chloride infusion   Intravenous Continuous Agbata, Tochukwu, MD 50 mL/hr at 03/14/22 1506 Infusion Verify at 03/14/22 1506   acetaminophen (TYLENOL) tablet 650 mg  650 mg Oral Q6H PRN Agbata, Tochukwu, MD   650 mg at 03/15/22 0043   ceFEPIme (MAXIPIME) 2 g in sodium chloride 0.9 % 100 mL IVPB  2 g Intravenous Q24H Darrick Penna, [facility]       Chlorhexidine Gluconate Cloth 2 % PADS 6 each  6 each Topical Q0600 Agbata, Tochukwu, MD   6 each at 03/15/22 0538   diatrizoate  meglumine-sodium (GASTROGRAFIN) 66-10 % solution 90 mL  90 mL Oral Once Edison Simon R, PA-C       HYDROmorphone (DILAUDID) injection 0.5 mg  0.5 mg Intravenous Q4H PRN Agbata, Tochukwu, MD   0.5 mg at 03/15/22 0418   levETIRAcetam (KEPPRA) IVPB 1000 mg/100 mL premix  1,000 mg Intravenous BID Agbata, Tochukwu, MD 400 mL/hr at 03/14/22 2157 1,000 mg at 03/14/22 2157   ondansetron (ZOFRAN) tablet 4 mg  4 mg Oral Q6H PRN Agbata, Tochukwu, MD       Or   ondansetron (ZOFRAN) injection 4 mg  4 mg Intravenous Q6H PRN Agbata, Tochukwu, MD   4 mg at 03/15/22 0625   pantoprazole (PROTONIX) injection 40 mg  40 mg Intravenous Q24H Agbata, Tochukwu, MD   40 mg at 03/14/22 1426   tamsulosin (FLOMAX) capsule 0.4 mg  0.4 mg Oral Daily Agbata, Tochukwu, MD       Objective: Vital signs in last 24 hours: Temp:  [97.7 F (36.5 C)-99 F (37.2 C)] 97.7 F (36.5 C) (11/16 0821) Pulse Rate:  [56-66] 56 (11/16 0821) Resp:  [15-22] 21 (11/16 0821) BP: (117-147)/(49-63) 140/55 (11/16 0821) SpO2:  [90 %-98 %] 97 % (11/16 0821)  Intake/Output from previous day: 11/15 0701 - 11/16 0700 In: 730.8 [I.V.:30.8; IV Piggyback:700] Out: 700 [Urine:650; Emesis/NG output:50] Intake/Output this shift: No intake/output data recorded.  Physical Exam Vitals and nursing note reviewed.  Constitutional:      General: She is not in acute distress.    Appearance: She is not ill-appearing, toxic-appearing or diaphoretic.  HENT:     Head: Normocephalic and atraumatic.  Pulmonary:     Effort: Pulmonary effort is normal. No respiratory distress.  Abdominal:     General: There is distension.     Tenderness: There is abdominal tenderness in the epigastric area.  Skin:    General: Skin is warm and dry.  Neurological:     Mental Status: She is alert.  Psychiatric:        Mood and Affect: Mood normal.        Behavior: Behavior normal.    Lab Results:  Recent Labs    03/14/22 0800 03/15/22 0409  WBC 12.9* 9.2  HGB  11.1* 10.4*  HCT 34.5* 32.0*  PLT 193 184   BMET Recent Labs    03/14/22 0800 03/15/22 0409  NA 142 143  K 3.6 3.5  CL 111 114*  CO2 21* 21*  GLUCOSE 105* 89  BUN 39* 37*  CREATININE 2.96* 2.74*  CALCIUM 9.5 9.1   PT/INR Recent Labs    03/14/22 0854  LABPROT 16.7*  INR 1.4*   Studies/Results: DG Abd 2 Views  Result Date: 03/15/2022 CLINICAL DATA:  Small-bowel obstruction EXAM: ABDOMEN - 2 VIEW COMPARISON:  CT AP 03/14/22. FINDINGS: There are persistently dilated loops of small bowel throughout the abdomen measuring up to 4.7 cm, likely at least slightly increased in size compared to the scout images acquired at time of prior CT abdomen and pelvis. There are likely also new dilated loops of small bowel in the left lower quadrant. There is no evidence of pneumatosis. No pneumoperitoneum. Subdiaphragmatic lucency on the left is favored to represent air within a distended stomach. History are unremarkable. No acute osseous abnormality. IMPRESSION: Persistently dilated loops of small bowel throughout the abdomen, likely at least slightly increased in size compared to the scout images acquired at time of prior CT abdomen and pelvis. Electronically Signed   By: Marin Roberts M.D.   On: 03/15/2022 08:19   US Pelvis Complete  Result Date: 03/14/2022 CLINICAL DATA:  Lower abdominal pain, abnormal CT abdomen and pelvis EXAM: TRANSABDOMINAL ULTRASOUND OF PELVIS TECHNIQUE: Transabdominal ultrasound examination of the pelvis was performed including evaluation of the uterus, ovaries, adnexal regions, and pelvic cul-de-sac. COMPARISON:  CT done earlier today FINDINGS: Uterus Not sonographically visualized. Right ovary Not sonographically visualized. Left ovary Not sonographically visualized. Other findings: Foley catheter and suprapubic cystostomy catheter is noted. Urinary bladder is not distended limiting evaluation in this transabdominal sonogram. According to the note by the technologist,  patient refused transvaginal examination. IMPRESSION: There is incomplete distention of the urinary bladder limiting evaluation of pelvis. Uterus and ovaries could not be sonographically visualized for evaluation. Electronically Signed   By: Elmer Picker M.D.   On: 03/14/2022 14:33   CT ABDOMEN PELVIS WO CONTRAST  Result Date: 03/14/2022 CLINICAL DATA:  Abdominal pain, nausea, and vomiting. Status post recent suprapubic catheter placement 03/12/2022. Patient denies hysterectomy. EXAM: CT ABDOMEN AND PELVIS WITHOUT CONTRAST TECHNIQUE: Multidetector CT imaging of the abdomen and pelvis was performed following the standard protocol without IV contrast. RADIATION DOSE REDUCTION: This exam was performed according to the departmental dose-optimization program which includes automated exposure control, adjustment of the mA and/or kV according to patient size and/or use of iterative reconstruction technique. COMPARISON:  CT abdomen and pelvis  dated 08/18/2014 FINDINGS: Lower chest: Bilateral lower lobe subsegmental atelectasis, right-greater-than-left. No pleural effusion or pneumothorax demonstrated. Coronary artery calcifications. No pericardial effusion. Hepatobiliary: No focal hepatic lesions. No intra or extrahepatic biliary ductal dilation. Normal gallbladder. Pancreas: No focal lesions or main ductal dilation. Spleen: Normal in size without focal abnormality. Adrenals/Urinary Tract: No adrenal nodules. Atrophic kidneys with mild residual hydronephrosis and tortuous ureters demonstrating thickened urothelium. No suspicious focal renal masses by noncontrast technique. No calculi. The urinary bladder is decompressed with a Foley and suprapubic catheters in-situ. The suprapubic catheter insertion site in the anterior abdominal wall closely abuts a loop of decompressed small bowel. Mild pericystic stranding. Stomach/Bowel: Normal appearance of the stomach. A few dilated loops of proximal small bowel within the  left hemiabdomen to the level of the midline umbilical level, where there is gradual caliber transition. These bowel loops appear to tether to the uterus in the right adnexa. Normal appendix. Vascular/Lymphatic: Aortic atherosclerosis. no enlarged abdominal or pelvic lymph nodes. Reproductive: The uterus is deviated towards the right adnexa. Apparent tethering of multiple small bowel loops as described. No left adnexal mass. Other: No free fluid, fluid collection, or free air. Musculoskeletal: No acute or abnormal lytic or blastic osseous lesions. Multilevel degenerative changes of the partially imaged thoracic and lumbar spine. IMPRESSION: 1. A few dilated loops of proximal small bowel within the left hemiabdomen gradually taper to the level of the right lower quadrant, where these bowel loops appear to tether to the uterus in the right adnexa. Findings are suggestive of ileus versus partial small bowel obstruction. Consider pelvic ultrasound examination for further evaluation of the uterus and right adnexa and etiology of tethered appearance of the bowel loops. Per clinical record, the patient may be unable to tolerate a transvaginal examination. Given patient body habitus and urinary catheters, recommend instilling saline into the bladder to improve bladder distention and transabdominal sonographic window. 2. Mild pericystic stranding. Correlate with urinalysis for cystitis. 3. 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 4.  Aortic Atherosclerosis (ICD10-I70.0). These results were called by telephone at the time of interpretation on 03/14/2022 at 10:25 am to provider [facility] , who verbally acknowledged these results. Electronically Signed   By: Darrin Nipper M.D.   On: 03/14/2022 10:37   DG Chest Port 1 View  Result Date: 03/14/2022 CLINICAL DATA:  Questionable sepsis - evaluate for  abnormality EXAM: PORTABLE CHEST 1 VIEW COMPARISON:  Radiograph 01/31/2022 FINDINGS: Unchanged cardiomediastinal silhouette. There is bandlike atelectasis in the left lower lung. There is no airspace consolidation. There is no pleural effusion or pneumothorax. There is no acute osseous abnormality. IMPRESSION: No focal airspace consolidation. Bandlike atelectasis in the left lower lung. Electronically Signed   By: Maurine Simmering M.D.   On: 03/14/2022 08:21    Assessment & Plan: 71 year old female with chronic urinary retention and urethral stricture now POD 3 from pelvic exam under anesthesia with suprapubic catheter placement and urethral dilation now admitted with possible UTI versus urosepsis and ileus versus partial SBO.  Appreciate general surgery involvement with regard to her bowel function.  I continue to have low concerns for bowel injury at this time.  Overall, she appears to be clinically improving today.  Okay to continue antibiotics while we await her blood culture results, though negative urine culture is reassuring.  Renal function is improving today, suspect an element of dehydration on admission.  No changes to the plan today.  We  will continue to monitor.  Debroah Loop, PA-C 03/15/2022

## 2022-03-15 NOTE — Progress Notes (Signed)
NG tube placed 1325. Tolerated well. Continued c/o of nausea IV compazine administered 1510. Pt now resting comfortable. Nausea has subsided. Will continue to monitor.

## 2022-03-15 NOTE — Progress Notes (Signed)
Pt completed Gastrografin X-Ray notified of completion time of 0927.

## 2022-03-15 NOTE — Assessment & Plan Note (Signed)
See partial SBO and surgery's recommendations

## 2022-03-15 NOTE — Progress Notes (Addendum)
Progress Note   Patient: Leslie Houston SHF:[id number] DOB: Nov 26, 1950 DOA: 03/14/2022     2 DOS: the patient was seen and examined on 03/16/2022   Brief hospital course: Leslie Houston is a 71 y.o. 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  She presented to the ED via EMS on 03/14/2022 for evaluation of severe abdominal pain with associated with nausea and emesis.   Patient was found to have partial SBO vs ileus. She was started on empiric IV antibiotics due to concern for UTI.    Urology and General Surgery are consulted.     Assessment and Plan: * Partial small bowel obstruction ([place]) Presented with diffuse abdominal pain, N/V and constipation.  CT scan of abdomen and pelvis showed a few dilated loops of proximal small bowel within the left hemi abdomen gradually taper to the level of the right lower quadrant, where these bowel loops appear to tether to the uterus in the right adnexa.  Findings are suggestive of ileus versus partial small bowel obstruction. --General surgery following --NPO except ice chips & sips w meds --Gastrograffin challenge underway --No NG tube needed thus far --Monitor abdominal exam closely --PRN antiemetics --Gentle maintenance IV fluids  UTI (urinary tract infection) Versus abnormal UA due to colonization and recent instrumentation.  Started on empiric IV antibiotics on admission.  Urine culture negative. Stop antibiotics and monitor clinically. Follow up pending blood culture (no growth to date).  SIRS (systemic inflammatory response syndrome) (HCC) Presented with tachypnea and leukocytosis in the setting of partial SBO.  There was  concern for UTI given recent instrumentation and SP catheter placement, but urine culture was negative.   Clinically doubt sepsis.  Partial intestinal obstruction (HCC) See partial SBO and surgery's recommendations  Obesity (BMI 30-39.9) BMI 50.27 Complicates overall prognosis and care Lifestyle modification and exercise has been discussed with patient  Urinary retention Status post recent suprapubic and ureteral catheterization on 11/13 with Dr. Erlene Quan. Plan was to leave ureteral Foley in for 1 week to allow urethral healing with a patent tract. We will consult urology for evaluation of severe suprapubic pain. --Urology is following.  Seizures ([place]) Continue IV Keppra until taking PO. Seizure precautions  CKD (chronic kidney disease) stage 4, GFR 15-29 ml/min (HCC) Renal function appears stable.   Monitor renal function closely. Consult nephrology consult if worsening of renal function  Hemiparesis affecting right side as late effect of cerebrovascular accident (CVA) ([place]) History of prior CVA with left-sided hemiparesis Resume aspirin once patient is able to tolerate p.o.        Subjective: Pt awake sitting up in bed when seen.  She denied nausea at the time, but started having N/V again after drinking contrast for imaging study.  She reported continued abdominal pain, mostly suprapubic.  Denies passing flatus or stool yet.  No other acute complaints.    Physical Exam: Vitals:   03/15/22 1936 03/16/22 0007 03/16/22 0544 03/16/22 0740  BP: (!) 143/48 (!) 138/49 (!) 146/50 (!) 158/46  Pulse: 63 63 (!) 56 (!) 58  Resp: '19 18 20 18  '$ Temp: 98.4 F (36.9 C) 97.9 F (36.6 C) 98.7 F (37.1 C) 98.1 F (36.7 C)  TempSrc: Oral Oral    SpO2: (!) 88% (!) 87% 92%  90%  Weight:      Height:       General exam: awake, alert, no acute distress HEENT: moist mucus membranes, hearing grossly normal  Respiratory system: CTAB, no wheezes, rales or rhonchi, normal respiratory  effort. Cardiovascular system: normal S1/S2, RRR, no pedal edema.   Gastrointestinal system: soft, mildly tender diffusely, no bowel sounds appreciated Genitourinary system:  Central nervous system: A&O x3. no gross focal neurologic deficits, normal speech Extremities: moves all, SCD's on BLE's, no edema, normal tone Skin: dry, intact, normal temperature Psychiatry: normal mood, congruent affect, judgement and insight appear normal   Data Reviewed:  Notable labs ---  Cl 114, CO2 21, BUN 37, Cr 2.74 from 2.96.  Hbg 10.4 from 11.1, WBC normalized to 9.2 from 12.9.  Urine culture negative.  Blood culture negative to date.    Family Communication: None at bedside, will attempt to call.   Disposition: Status is: Inpatient Remains inpatient appropriate because: persistent obstruction with ongoing evaluation, requiring IV antiemetics.    Planned Discharge Destination: Home    Time spent: 40 minutes  Author: Ezekiel Slocumb, DO 03/16/2022 9:03 AM  For on call review [email or web address].

## 2022-03-15 NOTE — Progress Notes (Signed)
[facility] SURGICAL PROGRESS NOTE (cpt [phone])  Hospital Day(s): 1.   Interval History: Patient seen and examined, no acute events or new complaints overnight. Patient reports she still has some abdominal soreness. No fever, chills, nausea, emesis. She believes she has passed a small amount of flatus. Labs this morning are reassuring. She did have KUB which shows similar appearance in dilated small bowel but there is a small amount of distal colonic gas.   Review of Systems:  Constitutional: denies fever, chills  HEENT: denies cough or congestion  Respiratory: denies any shortness of breath  Cardiovascular: denies chest pain or palpitations  Gastrointestinal: + abdominal pain (mild), denied N/V Genitourinary: denies burning with urination or urinary frequency Musculoskeletal: denies pain, decreased motor or sensation  Vital signs in last 24 hours: [min-max] current  Temp:  [97.7 F (36.5 C)-99 F (37.2 C)] 97.9 F (36.6 C) (11/16 0424) Pulse Rate:  [58-66] 60 (11/16 0424) Resp:  [15-22] 19 (11/16 0424) BP: (117-153)/(49-75) 126/54 (11/16 0424) SpO2:  [90 %-98 %] 93 % (11/16 0424)     Height: '5\' 11"'$  (180.3 cm) Weight: 85.3 kg BMI (Calculated): 35.54   Intake/Output last 2 shifts:  11/15 0701 - 11/16 0700 In: 730.8 [I.V.:30.8; IV Piggyback:700] Out: 700 [Urine:650; Emesis/NG output:50]   Physical Exam:  Constitutional: alert, cooperative and no distress  HENT: normocephalic without obvious abnormality  Eyes: PERRL, EOM's grossly intact and symmetric  Respiratory: breathing non-labored at rest  Cardiovascular: regular rate and sinus rhythm  Gastrointestinal: Soft, mild upper abdominal soreness, non-distended, no rebound/guarding Musculoskeletal: no edema or wounds, motor and sensation grossly intact, NT    Labs:     Latest Ref Rng & Units 03/15/2022    4:09 AM 03/14/2022    8:00 AM 01/31/2022   11:00 AM  CBC  WBC 4.0 - 10.5 K/uL 9.2  12.9  5.1    Hemoglobin 12.0 - 15.0 g/dL 10.4  11.1  11.7   Hematocrit 36.0 - 46.0 % 32.0  34.5  36.9   Platelets 150 - 400 K/uL 184  193  230       Latest Ref Rng & Units 03/15/2022    4:09 AM 03/14/2022    8:00 AM 01/31/2022   11:00 AM  CMP  Glucose 70 - 99 mg/dL 89  105  98   BUN 8 - 23 mg/dL 37  39  39   Creatinine 0.44 - 1.00 mg/dL 2.74  2.96  2.64   Sodium 135 - 145 mmol/L 143  142  136   Potassium 3.5 - 5.1 mmol/L 3.5  3.6  3.6   Chloride 98 - 111 mmol/L 114  111  108   CO2 22 - 32 mmol/L '21  21  22   '$ Calcium 8.9 - 10.3 mg/dL 9.1  9.5  9.1   Total Protein 6.5 - 8.1 g/dL  7.5  7.6   Total Bilirubin 0.3 - 1.2 mg/dL  0.6  0.5   Alkaline Phos 38 - 126 U/L  88  91   AST 15 - 41 U/L  16  16   ALT 0 - 44 U/L  9  11      Imaging studies:   KUB (03/15/2022) personally reviewed which shows similar appearance in dilated small bowel but there is a small amount of distal colonic gas, and radiologist report pending   Assessment/Plan: (ICD-10's: K52.609) 71 y.o. female with possible partial SBO vs ileus    - I will initiate gastrografin challenge   -  Hesitant to initiate diet just yet; okay for ice chips/sips with medications  - I think we can continue to hold off on NGT placement given lack of nausea/emesis  - No role for surgical interventions at this time.               - Monitor abdominal examination; on-going bowel function             - Pain control prn; antiemetics prn             - Appreciate urology input             - Further management per primary service; we will follow   All of the above findings and recommendations were discussed with the patient, and the medical team, and all of patient's questions were answered to her expressed satisfaction.  -- Edison Simon, PA-C [facility] 03/15/2022, 8:16 AM M-F: 7am - 4pm

## 2022-03-16 ENCOUNTER — Inpatient Hospital Stay: Payer: Medicare Other

## 2022-03-16 DIAGNOSIS — K5669 Other partial intestinal obstruction: Secondary | ICD-10-CM | POA: Diagnosis not present

## 2022-03-16 DIAGNOSIS — K56609 Unspecified intestinal obstruction, unspecified as to partial versus complete obstruction: Secondary | ICD-10-CM | POA: Diagnosis not present

## 2022-03-16 DIAGNOSIS — R1084 Generalized abdominal pain: Secondary | ICD-10-CM | POA: Diagnosis not present

## 2022-03-16 DIAGNOSIS — R6510 Systemic inflammatory response syndrome (SIRS) of non-infectious origin without acute organ dysfunction: Secondary | ICD-10-CM | POA: Diagnosis present

## 2022-03-16 DIAGNOSIS — E870 Hyperosmolality and hypernatremia: Secondary | ICD-10-CM | POA: Diagnosis not present

## 2022-03-16 LAB — BASIC METABOLIC PANEL
Anion gap: 9 (ref 5–15)
BUN: 38 mg/dL — ABNORMAL HIGH (ref 8–23)
CO2: 21 mmol/L — ABNORMAL LOW (ref 22–32)
Calcium: 9.8 mg/dL (ref 8.9–10.3)
Chloride: 117 mmol/L — ABNORMAL HIGH (ref 98–111)
Creatinine, Ser: 2.82 mg/dL — ABNORMAL HIGH (ref 0.44–1.00)
GFR, Estimated: 17 mL/min — ABNORMAL LOW (ref >60)
Glucose, Bld: 83 mg/dL (ref 70–99)
Potassium: 3.7 mmol/L (ref 3.5–5.1)
Sodium: 147 mmol/L — ABNORMAL HIGH (ref 135–145)

## 2022-03-16 LAB — CBC
HCT: 34.4 % — ABNORMAL LOW (ref 36.0–46.0)
Hemoglobin: 11.2 g/dL — ABNORMAL LOW (ref 12.0–15.0)
MCH: 32.8 pg (ref 26.0–34.0)
MCHC: 32.6 g/dL (ref 30.0–36.0)
MCV: 100.9 fL — ABNORMAL HIGH (ref 80.0–100.0)
Platelets: 241 10*3/uL (ref 150–400)
RBC: 3.41 MIL/uL — ABNORMAL LOW (ref 3.87–5.11)
RDW: 12.7 % (ref 11.5–15.5)
WBC: 6.8 10*3/uL (ref 4.0–10.5)
nRBC: 0 % (ref 0.0–0.2)

## 2022-03-16 MED ORDER — DEXTROSE IN LACTATED RINGERS 5 % IV SOLN: INTRAVENOUS | Status: DC

## 2022-03-16 NOTE — Assessment & Plan Note (Signed)
Presented with tachypnea and leukocytosis in the setting of partial SBO.  There was concern for UTI given recent instrumentation and SP catheter placement, but urine culture was negative.   Sepsis ruled out

## 2022-03-16 NOTE — TOC CM/SW Note (Signed)
  Transition of Care [facility]) Screening Note   Patient Details  Name: Leslie Houston Date of Birth: 1951/04/18   Transition of Care [facility]) CM/SW Contact:    Colen Darling, [place] Phone Number: 03/16/2022, 3:25 PM    Transition of Care Department [facility]) has reviewed patient and no TOC needs have been identified at this time. We will continue to monitor patient advancement through interdisciplinary progression rounds. If new patient transition needs arise, please place a TOC consult.

## 2022-03-16 NOTE — Progress Notes (Addendum)
Progress Note   Patient: Leslie Houston KKX:[id number] DOB: June 09, 1950 DOA: 03/14/2022     2 DOS: the patient was seen and examined on 03/16/2022   Brief hospital course: Leslie Houston is a 71 y.o. 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  She presented to the ED via EMS on 03/14/2022 for evaluation of severe abdominal pain with associated with nausea and emesis.   Patient was found to have partial SBO vs ileus. She was started on empiric IV antibiotics due to concern for UTI.    Urology and General Surgery are consulted.     Assessment and Plan: * SBO (small bowel obstruction) ([place]) Presented with diffuse abdominal pain, N/V and constipation.  CT scan of abdomen and pelvis showed a few dilated loops of proximal small bowel within the left hemi abdomen gradually taper to the level of the right lower quadrant, where these bowel loops appear to tether to the uterus in the right adnexa.  Findings are suggestive of ileus versus partial small bowel obstruction. --General surgery following --NPO except ice chips & sips w meds --NG tube placed 11/16, on LIWS --Serial KUB's --Monitor abdominal exam closely --PRN antiemetics & pain control --Gentle maintenance IV fluids  UTI (urinary tract infection) Versus abnormal UA due to colonization and recent instrumentation.  Started on empiric IV antibiotics on admission.  Urine culture negative. Stop antibiotics and monitor clinically. Follow up pending blood culture (no growth to date).  Hypernatremia Na 147 on 11/17 BMP, due to NPO status, free water deficit. --Change IV maintenance fluids to D5-LR --Daily BMP  SIRS  (systemic inflammatory response syndrome) (HCC) Presented with tachypnea and leukocytosis in the setting of partial SBO.  There was concern for UTI given recent instrumentation and SP catheter placement, but urine culture was negative.   Clinically doubt sepsis.  Partial intestinal obstruction (HCC) See partial SBO and surgery's recommendations  Obesity (BMI 30-39.9) BMI 16.96 Complicates overall prognosis and care Lifestyle modification and exercise has been discussed with patient  Urinary retention Status post recent suprapubic and ureteral catheterization on 11/13 with Dr. Erlene Quan. Plan was to leave ureteral Foley in for 1 week to allow urethral healing with a patent tract. We will consult urology for evaluation of severe suprapubic pain. --Urology is following.  Seizures ([place]) Continue IV Keppra until taking PO. Seizure precautions  CKD (chronic kidney disease) stage 4, GFR 15-29 ml/min (HCC) Renal function appears stable.   Monitor renal function closely. Consult nephrology consult if worsening of renal function  Hemiparesis affecting right side as late effect of cerebrovascular accident (CVA) ([place]) History of prior CVA with left-sided hemiparesis Resume aspirin once patient is able to tolerate p.o.        Subjective: Pt awake sitting up in bed when seen.  She denied nausea at the time, but started having N/V again after drinking contrast for imaging study.  She reported continued abdominal pain, mostly suprapubic.  Denies passing flatus or stool yet.  No other acute complaints.    Physical Exam: Vitals:   03/16/22 0007 03/16/22 0544 03/16/22 0740 03/16/22 1157  BP: (!) 138/49 (!) 146/50 (!) 158/46 (!) 155/46  Pulse: 63 (!) 56 (!) 58 (!) 58  Resp: '18 20 18 17  '$ Temp: 97.9 F (36.6 C)  98.7 F (37.1 C) 98.1 F (36.7 C) 98.6 F (37 C)  TempSrc: Oral     SpO2: (!) 87% 92% 90% (!) 89%  Weight:      Height:       General exam: sleeping, woke easily to voice, no  acute distress HEENT: NG tube present, moist mucus membranes, hearing grossly normal  Respiratory system: CTAB, no wheezes, rales or rhonchi, normal respiratory effort. Cardiovascular system: normal S1/S2, RRR, no pedal edema.   Gastrointestinal system: soft, less distended, non-tender, no bowel sounds appreciated Central nervous system: A&O x3. no gross focal neurologic deficits, normal speech Extremities: moves all, SCD's on BLE's, no edema, normal tone Skin: dry, intact, normal temperature Psychiatry: normal mood, congruent affect, judgement and insight appear normal   Data Reviewed:  Notable labs --- Na 147, Cl 117, CO2 21, BUN 38, Cr 2.82 stable, Hbg 11.2  Family Communication: None at bedside, will attempt to call.   Disposition: Status is: Inpatient Remains inpatient appropriate because: persistent obstruction with ongoing evaluation, requiring IV therapies, NG tube remains in place   Planned Discharge Destination: Home    Time spent: 40 minutes  Author: Ezekiel Slocumb, DO 03/16/2022 1:41 PM  For on call review [email or web address].

## 2022-03-16 NOTE — Care Management Important Message (Signed)
Important Message  Patient Details  Name: Leslie Houston MRN: [id number] Date of Birth: 1950-05-26   Medicare Important Message Given:  N/A - LOS <3 / Initial given by admissions     Juliann Pulse A Jourdin Gens 03/16/2022, 8:14 AM

## 2022-03-16 NOTE — Progress Notes (Signed)
[facility] SURGICAL PROGRESS NOTE (cpt [id number])  Hospital Day(s): 2.   Interval History: Patient seen and examined, no acute events or new complaints overnight. Unfortunately, she had return of emesis yesterday (11/16) after drinking contrast for PO challenge. NGT was laced secondary to this. I do not think she completed an adequate challenge. This morning, she reports her abdomen feels soft. No fever, chills, emesis. Intermittent nausea. She is passing flatus. NGT output recorded at 1250 ccs in last 24 hours with additional 600 ccs this morning.   Review of Systems:  Constitutional: denies fever, chills  HEENT: denies cough or congestion  Respiratory: denies any shortness of breath  Cardiovascular: denies chest pain or palpitations  Gastrointestinal: + abdominal pain, + nausea, denied emesis Genitourinary: denies burning with urination or urinary frequency Musculoskeletal: denies pain, decreased motor or sensation  Vital signs in last 24 hours: [min-max] current  Temp:  [97.9 F (36.6 C)-98.9 F (37.2 C)] 98.1 F (36.7 C) (11/17 0740) Pulse Rate:  [56-63] 58 (11/17 0740) Resp:  [16-20] 18 (11/17 0740) BP: (128-158)/(46-73) 158/46 (11/17 0740) SpO2:  [87 %-92 %] 90 % (11/17 0740)     Height: '5\' 11"'$  (180.3 cm) Weight: 85.3 kg BMI (Calculated): 35.54   Intake/Output last 2 shifts:  11/16 0701 - 11/17 0700 In: 0  Out: 2375 [Urine:1125; Emesis/NG output:1250]   Physical Exam:  Constitutional: alert, cooperative and no distress  HENT: normocephalic without obvious abnormality. NGT in place; output bilious  Eyes: PERRL, EOM's grossly intact and symmetric  Respiratory: breathing non-labored at rest  Cardiovascular: regular rate and sinus rhythm  Gastrointestinal: Soft, non-tender, non-distended, no rebound/guarding Musculoskeletal: no edema or wounds, motor and sensation grossly intact, NT    Labs:     Latest Ref Rng & Units 03/16/2022    5:55 AM 03/15/2022     4:09 AM 03/14/2022    8:00 AM  CBC  WBC 4.0 - 10.5 K/uL 6.8  9.2  12.9   Hemoglobin 12.0 - 15.0 g/dL 11.2  10.4  11.1   Hematocrit 36.0 - 46.0 % 34.4  32.0  34.5   Platelets 150 - 400 K/uL 241  184  193       Latest Ref Rng & Units 03/16/2022    5:55 AM 03/15/2022    4:09 AM 03/14/2022    8:00 AM  CMP  Glucose 70 - 99 mg/dL 83  89  105   BUN 8 - 23 mg/dL 38  37  39   Creatinine 0.44 - 1.00 mg/dL 2.82  2.74  2.96   Sodium 135 - 145 mmol/L 147  143  142   Potassium 3.5 - 5.1 mmol/L 3.7  3.5  3.6   Chloride 98 - 111 mmol/L 117  114  111   CO2 22 - 32 mmol/L '21  21  21   '$ Calcium 8.9 - 10.3 mg/dL 9.8  9.1  9.5   Total Protein 6.5 - 8.1 g/dL   7.5   Total Bilirubin 0.3 - 1.2 mg/dL   0.6   Alkaline Phos 38 - 126 U/L   88   AST 15 - 41 U/L   16   ALT 0 - 44 U/L   9      Imaging studies:   KUB (03/16/2022) personally reviewed showing newly placed NGT, similar appearance of small bowel dilation, and radiologist report reviewed below:  IMPRESSION: Persistent prominent diffuse small bowel dilatation, not substantially changed, compatible with distal small bowel obstruction. Enteric tube terminates  in the proximal stomach.   Assessment/Plan: (ICD-10's: K67.609) 71 y.o. female with persistent SBO vs ileus    - Will continue NGT decompression today; LIS; monitor and record output - Will continue to attempt conservative measures. She, and family, understand that if she fails to improve we may need to consider intervention.    - Serial KUBs; If she fails to improve over the weekend, I think we can repeat gastrografin challenge on Sunday. I do not think she had an adequate challenge yesterday given emesis and need for NGT  - Monitor abdominal examination; on-going bowel function             - Pain control prn; antiemetics prn             - Appreciate urology input             - Further management per primary service; we will follow    All of the above findings and recommendations were  discussed with the patient, and the medical team, and all of patient's questions were answered to her expressed satisfaction.  -- Edison Simon, PA-C [facility] 03/16/2022, 10:08 AM M-F: 7am - 4pm

## 2022-03-16 NOTE — Assessment & Plan Note (Addendum)
Na 147 on 11/17 BMP, due to NPO status, free water deficit. --Change IV maintenance fluids to D5-LR --Daily BMP

## 2022-03-17 ENCOUNTER — Inpatient Hospital Stay: Payer: Medicare Other

## 2022-03-17 DIAGNOSIS — N184 Chronic kidney disease, stage 4 (severe): Secondary | ICD-10-CM | POA: Diagnosis not present

## 2022-03-17 DIAGNOSIS — R339 Retention of urine, unspecified: Secondary | ICD-10-CM | POA: Diagnosis not present

## 2022-03-17 DIAGNOSIS — E870 Hyperosmolality and hypernatremia: Secondary | ICD-10-CM | POA: Diagnosis not present

## 2022-03-17 DIAGNOSIS — R1084 Generalized abdominal pain: Secondary | ICD-10-CM | POA: Diagnosis not present

## 2022-03-17 DIAGNOSIS — K56609 Unspecified intestinal obstruction, unspecified as to partial versus complete obstruction: Secondary | ICD-10-CM | POA: Diagnosis not present

## 2022-03-17 DIAGNOSIS — K5669 Other partial intestinal obstruction: Secondary | ICD-10-CM | POA: Diagnosis not present

## 2022-03-17 LAB — BASIC METABOLIC PANEL
Anion gap: 9 (ref 5–15)
BUN: 40 mg/dL — ABNORMAL HIGH (ref 8–23)
CO2: 22 mmol/L (ref 22–32)
Calcium: 10.1 mg/dL (ref 8.9–10.3)
Chloride: 118 mmol/L — ABNORMAL HIGH (ref 98–111)
Creatinine, Ser: 2.74 mg/dL — ABNORMAL HIGH (ref 0.44–1.00)
GFR, Estimated: 18 mL/min — ABNORMAL LOW (ref >60)
Glucose, Bld: 109 mg/dL — ABNORMAL HIGH (ref 70–99)
Potassium: 3.5 mmol/L (ref 3.5–5.1)
Sodium: 149 mmol/L — ABNORMAL HIGH (ref 135–145)

## 2022-03-17 LAB — PHOSPHORUS: Phosphorus: 2.8 mg/dL (ref 2.5–4.6)

## 2022-03-17 LAB — MAGNESIUM: Magnesium: 2.9 mg/dL — ABNORMAL HIGH (ref 1.7–2.4)

## 2022-03-17 MED ORDER — HYDRALAZINE HCL 20 MG/ML IJ SOLN
10.0000 mg | INTRAMUSCULAR | Status: DC | PRN
  Administered 2022-03-17 (×2): 10 mg via INTRAVENOUS
  Filled 2022-03-17 (×2): qty 1

## 2022-03-17 MED ORDER — POTASSIUM CL IN DEXTROSE 5% 20 MEQ/L IV SOLN
20.0000 meq | INTRAVENOUS | Status: DC
  Administered 2022-03-17: 20 meq via INTRAVENOUS
  Filled 2022-03-17 (×2): qty 1000

## 2022-03-17 NOTE — Progress Notes (Signed)
Progress Note   Patient: Leslie Houston [id number]:[id number] DOB: February 05, 1951 DOA: 03/14/2022     3 DOS: the patient was seen and examined on 03/17/2022   Brief hospital course: Leslie Houston is a 71 y.o. 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  She presented to the ED via EMS on 03/14/2022 for evaluation of severe abdominal pain with associated with nausea and emesis.   Patient was found to have partial SBO vs ileus. She was started on empiric IV antibiotics due to concern for UTI.    Urology and General Surgery are consulted.     Assessment and Plan: * SBO (small bowel obstruction) ([place]) Presented with diffuse abdominal pain, N/V and constipation.  CT scan of abdomen and pelvis showed a few dilated loops of proximal small bowel within the left hemi abdomen gradually taper to the level of the right lower quadrant, where these bowel loops appear to tether to the uterus in the right adnexa.  Findings are suggestive of ileus versus partial small bowel obstruction.  11/18 -- passing gas, pt reported small BM.  NG tube removed by surgery. Started clear liquid diet. --General surgery following --Advanced to clear liquids --NG tube placed 11/16, removed 11/18 --Serial KUB's --Monitor abdominal exam & bowel function  --PRN antiemetics & pain control --On D5w fluids for hypernatremia  UTI (urinary tract infection) Versus abnormal UA due to colonization and recent instrumentation.  Started on empiric IV antibiotics on admission.  Urine culture negative. Stop antibiotics and monitor clinically. Follow up pending blood culture (no growth to date).  Hypernatremia Na 147 >> 149,  due to NPO status, free water deficit. --Change D5-LR to D5w with K-Cl @ 100 cc/hr --Resume diet per surgery and encourage pt to drink water --Daily BMP  SIRS (systemic inflammatory response syndrome) (HCC) Presented with tachypnea and leukocytosis in the setting of partial SBO.  There was concern for UTI given recent instrumentation and SP catheter placement, but urine culture was negative.   Clinically doubt sepsis.  Partial intestinal obstruction (HCC) See partial SBO and surgery's recommendations  Obesity (BMI 30-39.9) BMI 78.46 Complicates overall prognosis and care Lifestyle modification and exercise has been discussed with patient  Urinary retention Status post recent suprapubic and ureteral catheterization on 11/13 with Dr. Erlene Quan. Plan was to leave ureteral Foley in for 1 week to allow urethral healing with a patent tract. We will consult urology for evaluation of severe suprapubic pain. --Urology is following.  Seizures ([place]) Continue IV Keppra until taking PO. Seizure precautions  CKD (chronic kidney disease) stage 4, GFR 15-29 ml/min (HCC) Renal function appears stable.   Monitor renal function closely. Consult nephrology consult if worsening of renal function  Hemiparesis affecting right side as late effect of cerebrovascular accident (CVA) ([place]) History of prior CVA with left-sided hemiparesis Resume aspirin once patient is able to tolerate p.o.        Subjective: Pt awake sitting up in recliner when seen this AM.  She asks when she can have something to drink.  Reports passing flatus and a very small amount of stool.  No other acute complaints.     Physical Exam: Vitals:   03/16/22 2309 03/17/22 0039 03/17/22 0529 03/17/22 0856  BP: (!) 150/63 (!) 162/55 (!) 174/52 [name])  187/56  Pulse: (!) 55 (!) 53 (!) 50 (!) 47  Resp:  '16 16 18  '$ Temp:  97.7 F (36.5 C) (!) 97.4 F (36.3 C) 98.8 F (37.1 C)  TempSrc:      SpO2:  90% 94% 94%  Weight:      Height:        General exam: awake, seated in recliner, no acute distress HEENT: NG tube present, moist mucus membranes, hearing grossly normal  Respiratory system: CTAB, no wheezes, rales or rhonchi, normal respiratory effort. Cardiovascular system: normal S1/S2, RRR, no pedal edema.   Gastrointestinal system: soft, non-tender, intermittent bowel sounds present but hypoactive Central nervous system: A&O x3. no gross focal neurologic deficits, normal speech Extremities: moves all, SCD's on BLE's, no edema, normal tone Skin: dry, intact, normal temperature Psychiatry: normal mood, congruent affect, judgement and insight appear normal   Data Reviewed:  Notable labs --- Na 147 >> 149, Cl 118, BUN 40, Cr 2.82>>2.74 stable  KUB this AM shows some improvement in SBO: IMPRESSION: 1. Slightly decreased but persistent diffuse dilation of small bowel loops. 2. Enteric tube tip projects over the stomach with side-hole proximally 1 cm distal to the GE junction. Consider advancing by 4 cm for more optimal positioning.   Family Communication: None at bedside, will attempt to call.   Disposition: Status is: Inpatient Remains inpatient appropriate because: persistent obstruction with ongoing evaluation, requiring IV therapies, NG tube remains in place   Planned Discharge Destination: Home    Time spent: 40 minutes  Author: Ezekiel Slocumb, DO 03/17/2022 11:44 AM  For on call review [email or web address].

## 2022-03-17 NOTE — Progress Notes (Signed)
Subjective:  CC: Leslie Houston is a 71 y.o. female  Hospital stay day 3,   partial SBO versus ileus  HPI: No acute complaints overnight.  Couple bowel movements noted and she continues to pass flatus.  Does not feel bloated and is asking for some liquids.  ROS:  General: Denies weight loss, weight gain, fatigue, fevers, chills, and night sweats. Heart: Denies chest pain, palpitations, racing heart, irregular heartbeat, leg pain or swelling, and decreased activity tolerance. Respiratory: Denies breathing difficulty, shortness of breath, wheezing, cough, and sputum. GI: Denies change in appetite, heartburn, nausea, vomiting, constipation, diarrhea, and blood in stool. GU: Denies difficulty urinating, pain with urinating, urgency, frequency, blood in urine.   Objective:   Temp:  [97.4 F (36.3 C)-98.8 F (37.1 C)] 98.8 F (37.1 C) (11/18 0856) Pulse Rate:  [47-59] 47 (11/18 0856) Resp:  [16-18] 18 (11/18 0856) BP: (150-187)/(46-63) 187/56 (11/18 0856) SpO2:  [89 %-94 %] 94 % (11/18 0856)     Height: '5\' 11"'$  (180.3 cm) Weight: 85.3 kg BMI (Calculated): 35.54   Intake/Output this shift:   Intake/Output Summary (Last 24 hours) at 03/17/2022 1140 Last data filed at 03/17/2022 0254 Gross per 24 hour  Intake 584.89 ml  Output 1500 ml  Net -915.11 ml    Constitutional :  alert, cooperative, appears stated age, and no distress  Respiratory:  clear to auscultation bilaterally  Cardiovascular:  regular rate and rhythm  Gastrointestinal: soft, non-tender; bowel sounds normal; no masses,  no organomegaly.   Skin: Cool and moist.   Psychiatric: Normal affect, non-agitated, not confused       LABS:     Latest Ref Rng & Units 03/17/2022    4:28 AM 03/16/2022    5:55 AM 03/15/2022    4:09 AM  CMP  Glucose 70 - 99 mg/dL 109  83  89   BUN 8 - 23 mg/dL 40  38  37   Creatinine 0.44 - 1.00 mg/dL 2.74  2.82  2.74   Sodium 135 - 145 mmol/L 149  147  143   Potassium 3.5 - 5.1 mmol/L 3.5   3.7  3.5   Chloride 98 - 111 mmol/L 118  117  114   CO2 22 - 32 mmol/L '22  21  21   '$ Calcium 8.9 - 10.3 mg/dL 10.1  9.8  9.1       Latest Ref Rng & Units 03/16/2022    5:55 AM 03/15/2022    4:09 AM 03/14/2022    8:00 AM  CBC  WBC 4.0 - 10.5 K/uL 6.8  9.2  12.9   Hemoglobin 12.0 - 15.0 g/dL 11.2  10.4  11.1   Hematocrit 36.0 - 46.0 % 34.4  32.0  34.5   Platelets 150 - 400 K/uL 241  184  193     RADS: CLINICAL DATA:  Small-bowel obstruction   EXAM: ABDOMEN - 2 VIEW   COMPARISON:  Abdominal radiograph dated 03/16/2022   FINDINGS: Enteric tube tip projects over the stomach with side hole approximately 1 cm distal to the GE junction.   Slightly decreased but persistent diffuse dilation of small bowel loops. No free air or pneumatosis. No acute or substantial osseous abnormality. The sacrum and coccyx are partially obscured by overlying bowel contents.   Right basilar and left mid lung linear opacities, likely atelectasis.   IMPRESSION: 1. Slightly decreased but persistent diffuse dilation of small bowel loops. 2. Enteric tube tip projects over the stomach with side-hole proximally 1 cm  distal to the GE junction. Consider advancing by 4 cm for more optimal positioning.     Electronically Signed   By: Darrin Nipper M.D.   On: 03/17/2022 08:50 Assessment:   Small bowel obstruction versus ileus.  Clinically improving with return of bowel function no complaints of pain.  KUB as noted above but likely decreasing small bowel dilation.  NG tube removed at bedside and will start on clear liquid diet.  Continue to monitor.  labs/images/medications/previous chart entries reviewed personally and relevant changes/updates noted above.

## 2022-03-17 NOTE — TOC Initial Note (Signed)
Transition of Care [facility]) - Initial/Assessment Note    Patient Details  Name: Leslie Houston MRN: [id number] Date of Birth: 08-Jun-1950  Transition of Care [facility]) CM/SW Contact:    Magnus Ivan, LCSW Phone Number: 03/17/2022, 11:50 AM  Clinical Narrative:                 Patient with high readmission risk score. CSW completed assessment with patient. Patient lives with her daughter and several other relatives.  Patient states her friend Murray Hodgkins provides transportation. PCP is Dr. Rosana Berger. Pharmacy is [facility]. Patient has had HH in the past. Unsure of agency. Patient has a walker, cane, bedside commode, and tub bench at home.  SDOH flagged for food resources. Discussed with patient who is interested in food resources being added to her AVS. CSW added resources to be included on AVS at time of DC.  Patient declines additional needs at this time.     Expected Discharge Plan: Home/Self Care Barriers to Discharge: Continued Medical Work up   Patient Goals and CMS Choice Patient states their goals for this hospitalization and ongoing recovery are:: home with family CMS Medicare.gov Compare Post Acute Care list provided to:: Patient Choice offered to / list presented to : Patient  Expected Discharge Plan and Services Expected Discharge Plan: Home/Self Care       Living arrangements for the past 2 months: Single Family Home                                      Prior Living Arrangements/Services Living arrangements for the past 2 months: Single Family Home Lives with:: Relatives Patient language and need for interpreter reviewed:: Yes Do you feel safe going back to the place where you live?: Yes      Need for Family Participation in Patient Care: Yes (Comment) Care giver support system in place?: Yes (comment) Current home services: DME Criminal Activity/Legal Involvement Pertinent to Current Situation/Hospitalization: No - Comment as  needed  Activities of Daily Living Home Assistive Devices/Equipment: Eyeglasses, Hearing aid ADL Screening (condition at time of admission) Patient's cognitive ability adequate to safely complete daily activities?: Yes Is the patient deaf or have difficulty hearing?: Yes Does the patient have difficulty seeing, even when wearing glasses/contacts?: Yes Does the patient have difficulty concentrating, remembering, or making decisions?: No Patient able to express need for assistance with ADLs?: Yes Does the patient have difficulty dressing or bathing?: No Independently performs ADLs?: Yes (appropriate for developmental age) Does the patient have difficulty walking or climbing stairs?: Yes Weakness of Legs: Right Weakness of Arms/Hands: Right  Permission Sought/Granted Permission sought to share information with : Customer service manager, Family Supports Permission granted to share information with : Yes, Verbal Permission Granted     Permission granted to share info w AGENCY: as needed        Emotional Assessment       Orientation: : Oriented to Self, Oriented to Place, Oriented to  Time, Oriented to Situation Alcohol / Substance Use: Not Applicable Psych Involvement: No (comment)  Admission diagnosis:  Generalized abdominal pain [R10.84] Partial small bowel obstruction ([place]) [K56.600] Urinary tract infection associated with cystostomy catheter, initial encounter ([place]) [T83.510A, N39.0] Urinary tract infection associated with cystostomy catheter, subsequent encounter [T83.510D, N39.0] Partial intestinal obstruction, unspecified cause (HCC) [K56.600] Nausea and vomiting, unspecified vomiting type [R11.2] Patient Active Problem List   Diagnosis Date Noted   SIRS (  systemic inflammatory response syndrome) (HCC) 03/16/2022   Hypernatremia 03/16/2022   SBO (small bowel obstruction) ([place]) 03/14/2022   Obesity (BMI 30-39.9) 03/14/2022   Partial intestinal obstruction ([place])  03/14/2022   Cerebrovascular accident (CVA) ([place]) 02/15/2022   Gait abnormality 02/15/2022   Urinary retention 02/15/2022   OSA (obstructive sleep apnea) 02/15/2022   History of CVA (cerebrovascular accident) 08/24/2021   Moderate episode of recurrent major depressive disorder ([place]) 06/28/2021   Statin myopathy 03/09/2020   Lymphedema 03/09/2020   Partial symptomatic epilepsy with complex partial seizures, not intractable, without status epilepticus ([place]) 02/18/2020   Bad dreams 02/18/2020   Chronic post-traumatic stress disorder (PTSD) 02/18/2020   Snoring 02/18/2020   Coronary artery disease 02/18/2020   Stroke ([place])    Debility 07/07/2019   Hyperkalemia 07/03/2019   AKI (acute kidney injury) ([place]) 07/02/2019   Anemia of chronic disease 07/02/2019   Bradycardia 07/02/2019   Acute CHF (congestive heart failure) ([place]) 07/02/2019   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   History of diabetes mellitus 01/22/2019   Osteopenia 01/24/2018   Anxiety 12/03/2017   Neutropenia ([place]) 12/31/2015   Seizures ([place])    Carotid stenosis    PVC's (premature ventricular contractions)    UTI (urinary tract infection)    Hemiparesis affecting right side as late effect of cerebrovascular accident (CVA) ([place]) 08/19/2014   CKD (chronic kidney disease) stage 4, GFR 15-29 ml/min ([place]) 08/19/2014   Morbid obesity ([place]) 03/04/2006   Essential hypertension 03/04/2006   GERD 03/04/2006   History of gastric ulcer 03/04/2006   PCP:  Teodora Medici, DO Pharmacy:   [facility], [place] [address] [place] [address] Phone: [phone] Fax: [phone]     Social Determinants of Health (SDOH) Interventions    Readmission Risk Interventions    03/17/2022   11:49 AM  Readmission Risk Prevention Plan  Transportation Screening Complete  PCP or Specialist Appt within 3-5 Days Complete  HRI or [place] Complete  Social Work Consult for  [facility] Planning/Counseling Complete  Palliative Care Screening Not Applicable  Medication Review Press photographer) Complete

## 2022-03-17 NOTE — Discharge Instructions (Signed)
Food Resources  Agency Name: [facility] Address: [address], [place], [place] [address] Phone: [phone] Website: www.[email or web address]  Service(s) Offered: Housing services, self-sufficiency, congregate meal  program, weatherization program, heating appliance  repair/replacement program, emergency food assistance,  housing counseling, home ownership program, wheels -towork program. Meals free for 60 and older at various  locations from 9am-1pm, Monday-Friday:  [facility], [address] [place], [phone] [place]   [facility], [address] [place] [phone]   [facility], [address]   [place] [phone] [facility], [address]   [place], [phone]  Agency Name: [facility] Address: [address], [place], [place] [address] Phone: [phone] Website: www.[email or web address] Service(s) Offered: Home delivered hot, frozen, and emergency  meals. Grocery assistance program which matches  volunteers one-on-one with seniors unable to grocery shop  for themselves. Must be 60 years and older; less than 20  hours of in-home aide service, limited or no driving ability;  live alone or with someone with a disability; live in  [place].  Agency Name: [facility] [employer] Address: [address] [place], [place] [address] Phone: [phone] Service(s) Offered: Food is served to shut-ins, homeless, elderly, and low  income people in the community every Saturday (11:30  am-12:30 pm) and Sunday (12:30 pm-1:30pm). Volunteers  also offer help and encouragement in seeking employment,  and spiritual guidance. August 23, 2016 8  Agency Name: [facility] [employer] [facility] Address: [address] [place], [place], [place] [address] Phone: [phone] Service(s) Offered: Child support services; child welfare services; food stamps;  Medicaid;  work first family assistance; and aid with fuel,  rent, food and medicine.  Agency Name: [facility] Address: [address] [place], [place] Phone: [phone] Website: www.dreamalign.com Services Offered: Monday 10:00am-12:00, 8:00pm-9:00pm, and Friday  10:00am-12:00. Agency Name: [facility] Address: [address], [place], [place] [address] Phone: [phone] Website: www.[email or web address].org Service(s) Offered: Serves weekday meals, open from 11:30 am- 1:00 pm., and  6:30-7:30pm, Monday-Wednesday-Friday distributes food  3:30-6pm, Monday-Wednesday-Friday.  Agency Name: [facility] Address: [address], [place], [place] Phone: [phone] Website: [email or web address] Services Offered: Distributes food the 4th Saturday of the month, starting at  8:00 am Agency Name: [facility] Address: [address], [place], [place] [address] Phone: [phone] Website: [email or web address] Service(s) Offered: [employer] [facility], weekly food pantry. Open Wednesdays from  10:00am-noon.  Agency Name: [facility] Address: [address], [place], [place] Phone: [phone] Services Offered: Distributes food 9am-1pm, Monday-Thursday. Call for details.   Agency Name: [facility] Address: [address] [place], [place] [address] Phone: [phone] Website: [email or web address] Service(s) Offered: Emergency Food Pantry. Call for assistance. Agency Name: [facility] Address: [address], [place], [place] [address] Phone: [phone] Service Offered: Emergency Food Pantry. Call for appointment.  Agency Name: [facility] Address: [address] [place], [place] [address] Phone: [phone] Website: [email or web address] Services Offered: Emergency Food Pantry. Call for details Agency Name: [facility] Address: [address] , [place] Phone: [phone] Website: [email or web address] Service(s) Offered: Emergency Food Pantry. Call for details.  Agency Name: [facility] Address: [address], , [place] [address] Phone: [phone] or [phone] Website: [email or web address]/ Service(s) Offered: Distribute food 9am-11:30 am, Tuesday-Friday, and 1- 3:30pm, Monday-Friday. Food pantry Monday-Friday  1pm-3pm, fresh items, Mon.-Wed.-Fri.  Agency Name: [facility] Address: [address] [place], [place] [address] Phone: [phone] Website: www.safealamance.org Services Offered: Distribute food Tues and Sats from 9:00am-noon. Closed  1st Saturday of each month. Call for details    

## 2022-03-18 ENCOUNTER — Inpatient Hospital Stay: Payer: Medicare Other

## 2022-03-18 DIAGNOSIS — K56609 Unspecified intestinal obstruction, unspecified as to partial versus complete obstruction: Secondary | ICD-10-CM | POA: Diagnosis not present

## 2022-03-18 DIAGNOSIS — K5669 Other partial intestinal obstruction: Secondary | ICD-10-CM | POA: Diagnosis not present

## 2022-03-18 DIAGNOSIS — N184 Chronic kidney disease, stage 4 (severe): Secondary | ICD-10-CM | POA: Diagnosis not present

## 2022-03-18 DIAGNOSIS — E870 Hyperosmolality and hypernatremia: Secondary | ICD-10-CM | POA: Diagnosis not present

## 2022-03-18 DIAGNOSIS — R1084 Generalized abdominal pain: Secondary | ICD-10-CM | POA: Diagnosis not present

## 2022-03-18 LAB — BASIC METABOLIC PANEL
Anion gap: 8 (ref 5–15)
BUN: 44 mg/dL — ABNORMAL HIGH (ref 8–23)
CO2: 21 mmol/L — ABNORMAL LOW (ref 22–32)
Calcium: 9.7 mg/dL (ref 8.9–10.3)
Chloride: 123 mmol/L — ABNORMAL HIGH (ref 98–111)
Creatinine, Ser: 2.75 mg/dL — ABNORMAL HIGH (ref 0.44–1.00)
GFR, Estimated: 18 mL/min — ABNORMAL LOW (ref >60)
Glucose, Bld: 93 mg/dL (ref 70–99)
Potassium: 3.4 mmol/L — ABNORMAL LOW (ref 3.5–5.1)
Sodium: 152 mmol/L — ABNORMAL HIGH (ref 135–145)

## 2022-03-18 LAB — GLUCOSE, CAPILLARY: Glucose-Capillary: 176 mg/dL — ABNORMAL HIGH (ref 70–99)

## 2022-03-18 MED ORDER — POTASSIUM CL IN DEXTROSE 5% 20 MEQ/L IV SOLN
20.0000 meq | INTRAVENOUS | Status: DC
  Administered 2022-03-18 – 2022-03-19 (×2): 20 meq via INTRAVENOUS
  Filled 2022-03-18 (×4): qty 1000

## 2022-03-18 NOTE — Evaluation (Signed)
Occupational Therapy Evaluation Patient Details Name: Leslie Houston MRN: [id number] DOB: Sep 16, 1950 Today's Date: 03/18/2022   History of Present Illness Pt is a 71 year old female admitted with severe abdominal pain with associated with nausea and emesis. Patient was found to have partial SBO vs ileus; PMH significant for dabetes mellitus with complications of stage IV chronic kidney disease, depression, history of prior CVA with residual hearing loss, coronary artery disease, anxiety disorder, status post recent suprapubic catheter placement for chronic urinary retention and urethral dilation for stricture with urology on 11/13   Clinical Impression   Chart reviewed, pt greeted in room agreeable to OT evaluation. Pt is alert and oriented x4. PTA pt reports she is MOD I for ADL, amb with SPC in house, RW in community. Intermittent assist for IADL as needed. Pt presents with deficits in activity tolerance on this date affecting optimal ADL completion. Pt performs functional mobility in room with RW with supervision, toilet transfer with supervision, MIN A for LB dressing, grooming at sink level with supervision. No OT recommended following discharge however OT will continue to follow acutely to facilitate return to PLOF and decrease regression of hospital acquired weakness.     Recommendations for follow up therapy are one component of a multi-disciplinary discharge planning process, led by the attending physician.  Recommendations may be updated based on patient status, additional functional criteria and insurance authorization.   Follow Up Recommendations  No OT follow up     Assistance Recommended at Discharge Intermittent Supervision/Assistance  Patient can return home with the following Assistance with cooking/housework    Functional Status Assessment  Patient has had a recent decline in their functional status and demonstrates the ability to make significant improvements in function  in a reasonable and predictable amount of time.  Equipment Recommendations  None recommended by OT    Recommendations for Other Services       Precautions / Restrictions Precautions Precautions: Fall Precaution Comments: suprapubic cath Restrictions Weight Bearing Restrictions: No      Mobility Bed Mobility Overal bed mobility: Modified Independent                  Transfers Overall transfer level: Needs assistance Equipment used: Rolling walker (2 wheels) Transfers: Sit to/from Stand Sit to Stand: Supervision                  Balance Overall balance assessment: Needs assistance Sitting-balance support: No upper extremity supported, Feet supported Sitting balance-Leahy Scale: Good     Standing balance support: Bilateral upper extremity supported Standing balance-Leahy Scale: Fair                             ADL either performed or assessed with clinical judgement   ADL Overall ADL's : Needs assistance/impaired                                       General ADL Comments: LB dressing with MIN A, grooming with SET UP, amb to bathroom with RW with supervision, toilet transfer with supervision     Vision Patient Visual Report: No change from baseline       Perception     Praxis      Pertinent Vitals/Pain Pain Assessment Pain Assessment: No/denies pain     Hand Dominance     Extremity/Trunk Assessment Upper Extremity Assessment  Upper Extremity Assessment: Overall WFL for tasks assessed   Lower Extremity Assessment Lower Extremity Assessment: Overall WFL for tasks assessed       Communication     Cognition Arousal/Alertness: Awake/alert Behavior During Therapy: WFL for tasks assessed/performed Overall Cognitive Status: Within Functional Limits for tasks assessed                                       General Comments  vital signs appear stable throughout, pt asking to drink something, advised  on NPO status    Exercises Other Exercises Other Exercises: edu re: role of OT, role of rehab, discharge recommednations, home safety, falls prevention, DME use   Shoulder Instructions      Home Living Family/patient expects to be discharged to:: Private residence Living Arrangements: Other relatives Available Help at Discharge: Family Type of Home: Apartment Home Access: Level entry     Home Layout: One level               Home Equipment: Cane - single Barista (2 wheels)          Prior Functioning/Environment Prior Level of Function : Independent/Modified Independent             Mobility Comments: SPC use in the home, RW community distances ADLs Comments: MOD I with ADL, assist with IADL as needed        OT Problem List: Decreased activity tolerance      OT Treatment/Interventions: Self-care/ADL training;Patient/family education;Therapeutic exercise;Energy conservation;DME and/or AE instruction;Therapeutic activities    OT Goals(Current goals can be found in the care plan section) Acute Rehab OT Goals Patient Stated Goal: get home OT Goal Formulation: With patient Time For Goal Achievement: 04/01/22 Potential to Achieve Goals: Good ADL Goals Pt Will Perform Grooming: with modified independence;standing Pt Will Perform Lower Body Dressing: with modified independence Pt Will Transfer to Toilet: with modified independence;ambulating Pt Will Perform Toileting - Clothing Manipulation and hygiene: with modified independence;sit to/from stand  OT Frequency: Min 2X/week    Co-evaluation              AM-PAC OT "6 Clicks" Daily Activity     Outcome Measure Help from another person eating meals?: None Help from another person taking care of personal grooming?: None Help from another person toileting, which includes using toliet, bedpan, or urinal?: None Help from another person bathing (including washing, rinsing, drying)?: A Little Help  from another person to put on and taking off regular upper body clothing?: None Help from another person to put on and taking off regular lower body clothing?: None 6 Click Score: 23   End of Session Equipment Utilized During Treatment: Rolling walker (2 wheels) Nurse Communication: Mobility status  Activity Tolerance: Patient tolerated treatment well Patient left: in bed;with call bell/phone within reach;with nursing/sitter in room  OT Visit Diagnosis: Unsteadiness on feet (R26.81)                Time: 1517-6160 OT Time Calculation (min): 9 min Charges:  OT General Charges $OT Visit: 1 Visit OT Evaluation $OT Eval Low Complexity: 1 Low  Shanon Payor, OTD OTR/L  03/18/22, 3:31 PM

## 2022-03-18 NOTE — Evaluation (Addendum)
Physical Therapy Evaluation Patient Details Name: Leslie Houston MRN: [id number] DOB: 1950-07-06 Today's Date: 03/18/2022  History of Present Illness  presented to ER Secondary to abdominal pain, nausea/vomiting; admitted for management of partial SBO, UTI  Clinical Impression  Patient resting in bed upon arrival to room; alert and oriented, follows commands and eager for OOB activities.  Denies pain this morning; notes marked improvement in abdominal pain, and hopeful for PO intake today.  Bilat UE/LE strength and ROM grossly symmetrical and WFL; no focal weakness appreciated.  Able to complete bed mobility with mod indep; sit/stand, basic transfers and gait (200') with RW, cga/close sup.  Demonstrates reciprocal stepping pattern with fair step height/length; fair cadence and overall gait speed (improves in wider, open spaces). Completes head turns, changes of direction without difficulty, minimal/no sway. Patient prefers continued use of RW at this time for optimal safety/endurance support  Would benefit from skilled PT to address above deficits and promote optimal return to PLOF.; Recommend transition to HHPT upon discharge from acute hospitalization.        Recommendations for follow up therapy are one component of a multi-disciplinary discharge planning process, led by the attending physician.  Recommendations may be updated based on patient status, additional functional criteria and insurance authorization.  Follow Up Recommendations Home health PT      Assistance Recommended at Discharge PRN  Patient can return home with the following  A little help with walking and/or transfers;A little help with bathing/dressing/bathroom    Equipment Recommendations  (has equipment needed)  Recommendations for Other Services       Functional Status Assessment Patient has had a recent decline in their functional status and demonstrates the ability to make significant improvements in function in  a reasonable and predictable amount of time.     Precautions / Restrictions Precautions Precautions: Fall Precaution Comments: suprapubic cath Restrictions Weight Bearing Restrictions: No      Mobility  Bed Mobility Overal bed mobility: Modified Independent                  Transfers Overall transfer level: Needs assistance Equipment used: Rolling walker (2 wheels) Transfers: Sit to/from Stand Sit to Stand: Min guard           General transfer comment: cuing for hand placement to prevent pulling on RW    Ambulation/Gait Ambulation/Gait assistance: Min guard Gait Distance (Feet): 200 Feet Assistive device: Rolling walker (2 wheels)   Gait velocity: 10' walk time, 10 seconds     General Gait Details: reciprocal stepping pattern with fair step height/length; fair cadence and overall gait speed (improves in wider, open spaces).  Completes head turns, changes of direction without difficulty, minimal/no sway.  Patient prefers continued use of RW at this time for optimal safety/endurance support  Stairs            Wheelchair Mobility    Modified Rankin (Stroke Patients Only)       Balance Overall balance assessment: Needs assistance Sitting-balance support: No upper extremity supported, Feet supported Sitting balance-Leahy Scale: Good     Standing balance support: Bilateral upper extremity supported Standing balance-Leahy Scale: Fair                               Pertinent Vitals/Pain Pain Assessment Pain Assessment: No/denies pain    Home Living Family/patient expects to be discharged to:: Private residence Living Arrangements: Other relatives reports multiple family members stay with/provide  frequent check in throughout the day Available Help at Discharge: Family Type of Home: Apartment Home Access: Level entry       Home Layout: One level Home Equipment: Industry - single Barista (2 wheels)      Prior Function  Prior Level of Function : Independent/Modified Independent             Mobility Comments: Mod indep with ADLs, household and limited community mobilization; SPC in home, RW in community.  Denies recent fall history       Hand Dominance        Extremity/Trunk Assessment   Upper Extremity Assessment Upper Extremity Assessment: Overall WFL for tasks assessed    Lower Extremity Assessment Lower Extremity Assessment: Overall WFL for tasks assessed (grossly 4/5 throughout)       Communication   Communication: HOH  Cognition Arousal/Alertness: Awake/alert Behavior During Therapy: WFL for tasks assessed/performed Overall Cognitive Status: Within Functional Limits for tasks assessed                                          General Comments      Exercises     Assessment/Plan    PT Assessment Patient needs continued PT services  PT Problem List Decreased activity tolerance;Decreased balance;Decreased mobility;Cardiopulmonary status limiting activity       PT Treatment Interventions DME instruction;Gait training;Functional mobility training;Therapeutic activities;Therapeutic exercise;Balance training;Patient/family education    PT Goals (Current goals can be found in the Care Plan section)  Acute Rehab PT Goals Patient Stated Goal: to return home PT Goal Formulation: With patient Time For Goal Achievement: 04/01/22 Potential to Achieve Goals: Good    Frequency Min 2X/week     Co-evaluation               AM-PAC PT "6 Clicks" Mobility  Outcome Measure Help needed turning from your back to your side while in a flat bed without using bedrails?: None Help needed moving from lying on your back to sitting on the side of a flat bed without using bedrails?: None Help needed moving to and from a bed to a chair (including a wheelchair)?: A Little Help needed standing up from a chair using your arms (e.g., wheelchair or bedside chair)?: A Little Help  needed to walk in hospital room?: A Little Help needed climbing 3-5 steps with a railing? : A Little 6 Click Score: 20    End of Session Equipment Utilized During Treatment: Gait belt Activity Tolerance: Patient tolerated treatment well Patient left: in chair;with call bell/phone within reach;with chair alarm set Nurse Communication: Mobility status PT Visit Diagnosis: Muscle weakness (generalized) (M62.81);Difficulty in walking, not elsewhere classified (R26.2)    Time: 7510-2585 PT Time Calculation (min) (ACUTE ONLY): 17 min   Charges:   PT Evaluation $PT Eval Low Complexity: 1 Low          Urho Rio H. Owens Shark, PT, DPT, NCS 03/18/22, 10:21 AM [phone]

## 2022-03-18 NOTE — Progress Notes (Signed)
Subjective:  CC: Leslie Houston is a 71 y.o. female  Hospital stay day 4,   partial SBO versus ileus  HPI: Couple episodes of emesis shortly after NG tube removal.  Patient states she has been fine since those 2 episodes.  Believes she may have drank too quickly.  No complaints this morning.  Continued to have couple more bowel movements after the vomiting episode.  ROS:  General: Denies weight loss, weight gain, fatigue, fevers, chills, and night sweats. Heart: Denies chest pain, palpitations, racing heart, irregular heartbeat, leg pain or swelling, and decreased activity tolerance. Respiratory: Denies breathing difficulty, shortness of breath, wheezing, cough, and sputum. GI: Denies change in appetite, heartburn, nausea, vomiting, constipation, diarrhea, and blood in stool. GU: Denies difficulty urinating, pain with urinating, urgency, frequency, blood in urine.   Objective:   Temp:  [98 F (36.7 C)-98.6 F (37 C)] 98.6 F (37 C) (11/19 0857) Pulse Rate:  [51-65] 51 (11/19 0857) Resp:  [16-18] 16 (11/19 0857) BP: (126-172)/(53-70) 152/58 (11/19 0857) SpO2:  [93 %-100 %] 93 % (11/19 0857)     Height: '5\' 11"'$  (180.3 cm) Weight: 85.3 kg BMI (Calculated): 35.54   Intake/Output this shift:   Intake/Output Summary (Last 24 hours) at 03/18/2022 1056 Last data filed at 03/18/2022 0645 Gross per 24 hour  Intake --  Output 2525 ml  Net -2525 ml    Constitutional :  alert, cooperative, appears stated age, and no distress  Respiratory:  clear to auscultation bilaterally  Cardiovascular:  regular rate and rhythm  Gastrointestinal: soft, non-tender; bowel sounds normal; no masses,  no organomegaly.  Slight distention?  Skin: Cool and moist.   Psychiatric: Normal affect, non-agitated, not confused       LABS:     Latest Ref Rng & Units 03/18/2022    4:30 AM 03/17/2022    4:28 AM 03/16/2022    5:55 AM  CMP  Glucose 70 - 99 mg/dL 93  109  83   BUN 8 - 23 mg/dL 44  40  38    Creatinine 0.44 - 1.00 mg/dL 2.75  2.74  2.82   Sodium 135 - 145 mmol/L 152  149  147   Potassium 3.5 - 5.1 mmol/L 3.4  3.5  3.7   Chloride 98 - 111 mmol/L 123  118  117   CO2 22 - 32 mmol/L '21  22  21   '$ Calcium 8.9 - 10.3 mg/dL 9.7  10.1  9.8       Latest Ref Rng & Units 03/16/2022    5:55 AM 03/15/2022    4:09 AM 03/14/2022    8:00 AM  CBC  WBC 4.0 - 10.5 K/uL 6.8  9.2  12.9   Hemoglobin 12.0 - 15.0 g/dL 11.2  10.4  11.1   Hematocrit 36.0 - 46.0 % 34.4  32.0  34.5   Platelets 150 - 400 K/uL 241  184  193     RADS: Pending KUB this AM. Assessment:   Small bowel obstruction versus ileus.  No change in overall clinical exam again this morning despite the 2 episodes of vomiting after NG tube removal yesterday.  We will obtain KUB this morning to see if persistent dilation is noted in the small bowel and base advancing diet on imaging at this point.  Further care per hospitalist in the meantime.  labs/images/medications/previous chart entries reviewed personally and relevant changes/updates noted above.

## 2022-03-18 NOTE — Progress Notes (Signed)
Progress Note   Patient: Leslie Houston JKK:[id number] DOB: 1951/03/14 DOA: 03/14/2022     4 DOS: the patient was seen and examined on 03/18/2022   Brief hospital course: Leslie Houston is a 71 y.o. 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  She presented to the ED via EMS on 03/14/2022 for evaluation of severe abdominal pain with associated with nausea and emesis.   Patient was found to have partial SBO vs ileus. She was started on empiric IV antibiotics due to concern for UTI.    Urology and General Surgery are consulted.     Assessment and Plan: * SBO (small bowel obstruction) ([place]) Presented with diffuse abdominal pain, N/V and constipation.  CT scan of abdomen and pelvis showed a few dilated loops of proximal small bowel within the left hemi abdomen gradually taper to the level of the right lower quadrant, where these bowel loops appear to tether to the uterus in the right adnexa.  Findings are suggestive of ileus versus partial small bowel obstruction.  11/18 -- passing gas, pt reported small BM.  NG tube removed by surgery. Started clear liquid diet.  Later had recurrent vomiting & NPO status was resumed. 11/19 -- NG remains out, N/V improved. Remains NPO ex sips w meds --General surgery following --NPO, advancement of diet per surgery --NG tube placed 11/16, removed 11/18 --Serial KUB's --Monitor abdominal exam & bowel function  --PRN antiemetics & pain control --On D5w fluids for hypernatremia  UTI (urinary tract infection) Versus abnormal UA due to colonization and recent instrumentation.  Started on empiric IV antibiotics on admission.  Urine culture  negative. Stop antibiotics and monitor clinically. Follow up pending blood culture (no growth to date).  Hypernatremia Na 147 >> 149 >> 152, due to NPO status, free water deficit. --Continue D5w with K-Cl @ 100 cc/hr --Resumption of diet per surgery  --Encourage pt to drink water once taking PO --Daily BMP  SIRS (systemic inflammatory response syndrome) (HCC) Presented with tachypnea and leukocytosis in the setting of partial SBO.  There was concern for UTI given recent instrumentation and SP catheter placement, but urine culture was negative.   Clinically doubt sepsis.  Partial intestinal obstruction (HCC) See partial SBO and surgery's recommendations  Obesity (BMI 30-39.9) BMI 71.69 Complicates overall prognosis and care Lifestyle modification and exercise has been discussed with patient  Urinary retention Status post recent suprapubic and ureteral catheterization on 11/13 with Dr. Erlene Quan. Plan was to leave ureteral Foley in for 1 week to allow urethral healing with a patent tract. We will consult urology for evaluation of severe suprapubic pain. --Urology is following.  Seizures ([place]) Continue IV Keppra until taking PO. Seizure precautions  CKD (chronic kidney disease) stage 4, GFR 15-29 ml/min (HCC) Renal function appears stable.   Monitor renal function closely. Consult nephrology consult if worsening of renal function  Hemiparesis affecting right side as late effect of cerebrovascular accident (CVA) ([place]) History of prior CVA with left-sided hemiparesis Resume aspirin once patient is able to tolerate p.o.        Subjective: Pt seen ambulating the floor with therapy, and later seated in recliner.  She reports feeling well.  She admits to recurrent vomiting yesterday after she was allowed to drink again, thinks she  drank too much too fast.  Feels much better today.  Continues to pass gain. Denies abdominal pain. Only small BM's.   Physical Exam: Vitals:    03/17/22 1609 03/17/22 2018 03/18/22 0414 03/18/22 0857  BP: (!) 172/60 (!) 126/53 (!) 144/55 (!) 152/58  Pulse: (!) 51 65 (!) 56 (!) 51  Resp: '17 18 17 16  '$ Temp: 98.1 F (36.7 C) 98 F (36.7 C) 98.5 F (36.9 C) 98.6 F (37 C)  TempSrc:      SpO2: 97% 93% 94% 93%  Weight:      Height:       General exam: awake, seated in recliner, no acute distress HEENT: NG tube present, moist mucus membranes, hearing grossly normal  Respiratory system: CTAB, no wheezes, rales or rhonchi, normal respiratory effort. Cardiovascular system: normal S1/S2, RRR, no pedal edema.   Gastrointestinal system: soft, non-tender, intermittent bowel sounds present but hypoactive Central nervous system: A&O x3. no gross focal neurologic deficits, normal speech Extremities: moves all, SCD's on BLE's, no edema, normal tone Skin: dry, intact, normal temperature Psychiatry: normal mood, congruent affect, judgement and insight appear normal   Data Reviewed:  Notable labs --- Na 147 >> 149 >> 152, Cl 123, BUN 44, Cr 2.75 stable  No new imaging studies thus far today.   Family Communication: None at bedside, will attempt to call.   Disposition: Status is: Inpatient  Remains inpatient appropriate because: recurrent SBO signs/symptoms after NG removed yesterday.  Electrolyte derangements requiring IV fluids.     Planned Discharge Destination: Home    Time spent: 35 minutes  Author: Ezekiel Slocumb, DO 03/18/2022 1:14 PM  For on call review [email or web address].

## 2022-03-18 NOTE — Progress Notes (Signed)
Mobility Specialist - Progress Note    03/18/22 1600  Mobility  Activity Stood at bedside;Dangled on edge of bed;Ambulated with assistance in room  Level of Assistance Contact guard assist, steadying assist  [place] wheel walker  Distance Ambulated (ft) 10 ft  Activity Response Tolerated well  Mobility Referral Yes  $Mobility charge 1 Mobility   Pt awake in bed on RA upon entry. Pt mobility ended early due to new IV insertion. Pt STS and side steps to [facility] and lays back down. Pt left with needs in reach and Rn present.   Loma Sender Mobility Specialist 03/18/22, 5:01 PM

## 2022-03-18 NOTE — TOC Progression Note (Addendum)
Transition of Care [facility]) - Progression Note    Patient Details  Name: Leslie Houston MRN: [id number] Date of Birth: 1950-06-04  Transition of Care [facility]) CM/SW [place], LCSW Phone Number: 03/18/2022, 8:19 AM  Clinical Narrative:    Notified by Dan Maker Rep that patient was active with [facility] in the past.   12:10- PT rec for HHPT. Patient is agreeable, wants to use [facility] again. [name] with [facility] is aware.   Expected Discharge Plan: Home/Self Care Barriers to Discharge: Continued Medical Work up  Expected Discharge Plan and Services Expected Discharge Plan: Home/Self Care       Living arrangements for the past 2 months: Single Family Home                                       Social Determinants of Health (SDOH) Interventions Food Insecurity Interventions: Inpatient TOC  Readmission Risk Interventions    03/17/2022   11:49 AM  Readmission Risk Prevention Plan  Transportation Screening Complete  PCP or Specialist Appt within 3-5 Days Complete  HRI or [place] Complete  Social Work Consult for [place] Planning/Counseling Complete  Palliative Care Screening Not Applicable  Medication Review Press photographer) Complete

## 2022-03-18 NOTE — Progress Notes (Signed)
At bedside to place PIV.  Pt in chair and states she needs to use the bathroom prior to anything.  RN notified, Rn also states no IV assessment has been done.  Requested RN to place consult in if staff unable to obtain PIV and pt is in the bed.

## 2022-03-19 ENCOUNTER — Ambulatory Visit: Payer: Medicare Other | Admitting: Physician Assistant

## 2022-03-19 ENCOUNTER — Inpatient Hospital Stay
Admission: RE | Admit: 2022-03-19 | Discharge: 2022-03-19 | Disposition: A | Discharge status: Home / Self Care | Payer: Medicare Other | Source: Ambulatory Visit | Attending: Neurology | Admitting: Neurology

## 2022-03-19 DIAGNOSIS — R339 Retention of urine, unspecified: Secondary | ICD-10-CM | POA: Diagnosis not present

## 2022-03-19 DIAGNOSIS — R1084 Generalized abdominal pain: Secondary | ICD-10-CM | POA: Diagnosis not present

## 2022-03-19 DIAGNOSIS — K5669 Other partial intestinal obstruction: Secondary | ICD-10-CM | POA: Diagnosis not present

## 2022-03-19 DIAGNOSIS — N3592 Unspecified urethral stricture, female: Secondary | ICD-10-CM | POA: Diagnosis not present

## 2022-03-19 DIAGNOSIS — K56609 Unspecified intestinal obstruction, unspecified as to partial versus complete obstruction: Secondary | ICD-10-CM | POA: Diagnosis not present

## 2022-03-19 LAB — BASIC METABOLIC PANEL
Anion gap: 7 (ref 5–15)
BUN: 47 mg/dL — ABNORMAL HIGH (ref 8–23)
CO2: 22 mmol/L (ref 22–32)
Calcium: 9.7 mg/dL (ref 8.9–10.3)
Chloride: 125 mmol/L — ABNORMAL HIGH (ref 98–111)
Creatinine, Ser: 2.74 mg/dL — ABNORMAL HIGH (ref 0.44–1.00)
GFR, Estimated: 18 mL/min — ABNORMAL LOW (ref >60)
Glucose, Bld: 114 mg/dL — ABNORMAL HIGH (ref 70–99)
Potassium: 3.6 mmol/L (ref 3.5–5.1)
Sodium: 154 mmol/L — ABNORMAL HIGH (ref 135–145)

## 2022-03-19 LAB — CULTURE, BLOOD (ROUTINE X 2)
Culture: NO GROWTH
Culture: NO GROWTH
Special Requests: ADEQUATE
Special Requests: ADEQUATE

## 2022-03-19 LAB — PHOSPHORUS: Phosphorus: 2.8 mg/dL (ref 2.5–4.6)

## 2022-03-19 MED ORDER — POTASSIUM CL IN DEXTROSE 5% 20 MEQ/L IV SOLN: 20.0000 meq | INTRAVENOUS | Status: DC

## 2022-03-19 MED ORDER — BOOST / RESOURCE BREEZE PO LIQD CUSTOM
1.0000 | Freq: Three times a day (TID) | ORAL | Status: DC
  Administered 2022-03-19 – 2022-03-20 (×4): 1 via ORAL

## 2022-03-19 MED ORDER — POTASSIUM CL IN DEXTROSE 5% 20 MEQ/L IV SOLN
20.0000 meq | INTRAVENOUS | Status: DC
  Administered 2022-03-19 – 2022-03-20 (×3): 20 meq via INTRAVENOUS
  Filled 2022-03-19 (×4): qty 1000

## 2022-03-19 NOTE — Progress Notes (Signed)
Occupational Therapy Treatment Patient Details Name: Leslie Houston MRN: [id number] DOB: [birth year] Today's Date: 03/19/2022   History of present illness Pt is a 71 year old female admitted with severe abdominal pain with associated with nausea and emesis. Patient was found to have partial SBO vs ileus; PMH significant for dabetes mellitus with complications of stage IV chronic kidney disease, depression, history of prior CVA with residual hearing loss, coronary artery disease, anxiety disorder, status post recent suprapubic catheter placement for chronic urinary retention and urethral dilation for stricture with urology on 11/13   OT comments  Pt seen for OT tx this date. Pt/family educated in energy conservation strategies and home/routines modifications including DME for bathing to improve safety. Pt appeared to be poorly positioned in bed, HOB reclined slightly and pt boosted up in bed for improved positioning with HOB elevated significantly and pt became nauseated and vomited. RN notified. Pt/family provided with additional blue emesis bags and cool damp washcloths. Additional OT deferred this date.   Recommendations for follow up therapy are one component of a multi-disciplinary discharge planning process, led by the attending physician.  Recommendations may be updated based on patient status, additional functional criteria and insurance authorization.    Follow Up Recommendations  No OT follow up     Assistance Recommended at Discharge Intermittent Supervision/Assistance  Patient can return home with the following  Assistance with cooking/housework   Equipment Recommendations  Tub/shower bench    Recommendations for Other Services      Precautions / Restrictions Precautions Precautions: Fall Precaution Comments: suprapubic cath Restrictions Weight Bearing Restrictions: No       Mobility Bed Mobility               General bed mobility comments: MAX A +2 to boost  up, pt feeling nauseated    Transfers                   General transfer comment: deferred 2/2 N/V     Balance                                           ADL either performed or assessed with clinical judgement   ADL       Grooming: Wash/dry hands;Wash/dry face;Bed level                                      Extremity/Trunk Assessment              Vision       Perception     Praxis      Cognition Arousal/Alertness: Awake/alert Behavior During Therapy: WFL for tasks assessed/performed Overall Cognitive Status: Within Functional Limits for tasks assessed                                          Exercises Other Exercises Other Exercises: Pt/family educated in energy conservation strategies and home/routines modifications including DME for bathing to improve safety    Shoulder Instructions       General Comments      Pertinent Vitals/ Pain       Pain Assessment Pain Assessment: No/denies pain  Home Living  Prior Functioning/Environment              Frequency  Min 2X/week        Progress Toward Goals  OT Goals(current goals can now be found in the care plan section)  Progress towards OT goals: OT to reassess next treatment  Acute Rehab OT Goals Patient Stated Goal: get home OT Goal Formulation: With patient Time For Goal Achievement: 04/01/22 Potential to Achieve Goals: Good  Plan Discharge plan remains appropriate;Frequency remains appropriate    Co-evaluation                 AM-PAC OT "6 Clicks" Daily Activity     Outcome Measure   Help from another person eating meals?: None Help from another person taking care of personal grooming?: None Help from another person toileting, which includes using toliet, bedpan, or urinal?: None Help from another person bathing (including washing, rinsing, drying)?: A  Little Help from another person to put on and taking off regular upper body clothing?: None Help from another person to put on and taking off regular lower body clothing?: None 6 Click Score: 23    End of Session    OT Visit Diagnosis: Unsteadiness on feet (R26.81)   Activity Tolerance Other (comment) (nausea, vomiting)   Patient Left in bed;with call bell/phone within reach;with family/visitor present   Nurse Communication Other (comment) (N/V)        Time: 8127-5170 OT Time Calculation (min): 14 min  Charges: OT General Charges $OT Visit: 1 Visit OT Treatments $Self Care/Home Management : 8-22 mins  Ardeth Perfect., MPH, MS, OTR/L ascom [phone] 03/19/22, 4:53 PM

## 2022-03-19 NOTE — Progress Notes (Signed)
Urology Inpatient Progress Note  Subjective: No acute events overnight.  Ongoing ileus versus SBO management by general surgery. Creatinine stable today, 2.74. She reports no acute concerns today.  Anti-infectives: Anti-infectives (From admission, onward)    Start     Dose/Rate Route Frequency Ordered Stop   03/15/22 1000  ceFEPIme (MAXIPIME) 2 g in sodium chloride 0.9 % 100 mL IVPB  Status:  Discontinued        2 g 200 mL/hr over 30 Minutes Intravenous Every 24 hours 03/14/22 1301 03/15/22 0915   03/14/22 0815  vancomycin (VANCOCIN) IVPB 1000 mg/200 mL premix        1,000 mg 200 mL/hr over 60 Minutes Intravenous  Once 03/14/22 0810 03/14/22 0950   03/14/22 0815  ceFEPIme (MAXIPIME) 2 g in sodium chloride 0.9 % 100 mL IVPB        2 g 200 mL/hr over 30 Minutes Intravenous  Once 03/14/22 0810 03/14/22 0925       Current Facility-Administered Medications  Medication Dose Route Frequency Provider Last Rate Last Admin   acetaminophen (TYLENOL) tablet 650 mg  650 mg Oral Q6H PRN Agbata, Tochukwu, MD   650 mg at 03/15/22 0043   Chlorhexidine Gluconate Cloth 2 % PADS 6 each  6 each Topical Q0600 Agbata, Tochukwu, MD   6 each at 03/19/22 0429   dextrose 5 % with KCl 20 mEq / L  infusion  20 mEq Intravenous Continuous Nicole Kindred A, DO 100 mL/hr at 03/19/22 0807 20 mEq at 03/19/22 3532   feeding supplement (BOOST / RESOURCE BREEZE) liquid 1 Container  1 Container Oral TID BM Tylene Fantasia, PA-C   1 Container at 03/19/22 0959   hydrALAZINE (APRESOLINE) injection 10 mg  10 mg Intravenous Q4H PRN Nicole Kindred A, DO   10 mg at 03/17/22 1818   HYDROmorphone (DILAUDID) injection 0.5 mg  0.5 mg Intravenous Q4H PRN Agbata, Tochukwu, MD   0.5 mg at 03/15/22 0418   levETIRAcetam (KEPPRA) IVPB 1000 mg/100 mL premix  1,000 mg Intravenous BID Agbata, Tochukwu, MD 400 mL/hr at 03/19/22 0922 1,000 mg at 03/19/22 0922   ondansetron (ZOFRAN) tablet 4 mg  4 mg Oral Q6H PRN Agbata, Tochukwu, MD        Or   ondansetron (ZOFRAN) injection 4 mg  4 mg Intravenous Q6H PRN Agbata, Tochukwu, MD   4 mg at 03/15/22 0625   pantoprazole (PROTONIX) injection 40 mg  40 mg Intravenous Q24H Agbata, Tochukwu, MD   40 mg at 03/18/22 1806   phenol (CHLORASEPTIC) mouth spray 1 spray  1 spray Mouth/Throat PRN Rometta Emery, RN       prochlorperazine (COMPAZINE) injection 10 mg  10 mg Intravenous Q6H PRN Nicole Kindred A, DO   10 mg at 03/16/22 0515   tamsulosin (FLOMAX) capsule 0.4 mg  0.4 mg Oral Daily Agbata, Tochukwu, MD   0.4 mg at 03/19/22 0803   Objective: Vital signs in last 24 hours: Temp:  [98 F (36.7 C)-98.6 F (37 C)] 98.1 F (36.7 C) (11/20 1106) Pulse Rate:  [48-56] 56 (11/20 1106) Resp:  [16-20] 20 (11/20 1106) BP: (147-173)/(40-64) 162/55 (11/20 1106) SpO2:  [94 %-98 %] 96 % (11/20 1106)  Intake/Output from previous day: 11/19 0701 - 11/20 0700 In: -  Out: 1700 [Urine:1700] Intake/Output this shift: No intake/output data recorded.  Physical Exam Vitals and nursing note reviewed.  Constitutional:      General: She is not in acute distress.    Appearance: She  is not ill-appearing, toxic-appearing or diaphoretic.  HENT:     Head: Normocephalic and atraumatic.  Abdominal:     Comments: Significant interval improvement in her lower abdominal tenderness.  Genitourinary:    Comments: Urethral Foley catheter in place, plugged.  Suprapubic catheter in place draining clear, yellow urine. Skin:    General: Skin is warm and dry.  Neurological:     Mental Status: She is alert.    BMET Recent Labs    03/18/22 0430 03/19/22 0431  NA 152* 154*  K 3.4* 3.6  CL 123* 125*  CO2 21* 22  GLUCOSE 93 114*  BUN 44* 47*  CREATININE 2.75* 2.74*  CALCIUM 9.7 9.7   Assessment & Plan: 71 year old female with chronic urinary retention and urethral stricture who underwent pelvic exam under anesthesia with suprapubic catheter placement and urethral dilation with Dr. Erlene Quan 7 days ago now  admitted with possible UTI versus urosepsis and ileus versus partial SBO.  She is due for urethral Foley removal today.  I performed this at the bedside this afternoon.  I removed 30 cc of water from the balloon and remove the urethral catheter in its entirety.  Patient tolerated well.  Her suprapubic catheter remains in place draining clear, yellow urine.  Unfortunately, her renal function has not significantly improved despite maximal urinary decompression.  Continue to appreciate general surgery management of her ileus versus SBO.  She is scheduled for monthly SP tube change with me on 04/11/2012.  She should keep this appointment.  Please keep suprapubic catheter in place.  Urology will sign off at this time.  Debroah Loop, PA-C 03/19/2022

## 2022-03-19 NOTE — Progress Notes (Addendum)
[facility] SURGICAL PROGRESS NOTE (cpt [id number])  Hospital Day(s): 5.   Interval History: Patient seen and examined, no acute events or new complaints overnight. Patient reports she is feeling markedly better. No abdominal pain, nausea, emesis. Labs are reassuring. KUB yesterday continued to show dilated loops of small bowel; relatively unchanged. However, this morning she adamantly denied any symptoms, continues to pass flatus, and has numerous BM recorded.   Review of Systems:  Constitutional: denies fever, chills  HEENT: denies cough or congestion  Respiratory: denies any shortness of breath  Cardiovascular: denies chest pain or palpitations  Gastrointestinal: denies abdominal pain, N/V Genitourinary: denies burning with urination or urinary frequency Musculoskeletal: denies pain, decreased motor or sensation  Vital signs in last 24 hours: [min-max] current  Temp:  [98 F (36.7 C)-98.6 F (37 C)] 98.3 F (36.8 C) (11/20 0724) Pulse Rate:  [48-51] 51 (11/20 0724) Resp:  [16-20] 16 (11/20 0724) BP: (147-173)/(40-64) 173/64 (11/20 0724) SpO2:  [94 %-98 %] 96 % (11/20 0724)     Height: '5\' 11"'$  (180.3 cm) Weight: 85.3 kg BMI (Calculated): 35.54   Intake/Output last 2 shifts:  11/19 0701 - 11/20 0700 In: -  Out: 1700 [Urine:1700]   Physical Exam:  Constitutional: alert, cooperative and no distress  HENT: normocephalic without obvious abnormality  Eyes: PERRL, EOM's grossly intact and symmetric  Respiratory: breathing non-labored at rest  Cardiovascular: regular rate and sinus rhythm  Gastrointestinal: Soft, mild distension and tympany, non-tender, no rebound/guarding. He is certainly without peritonitis Musculoskeletal: no edema or wounds, motor and sensation grossly intact, NT    Labs:     Latest Ref Rng & Units 03/16/2022    5:55 AM 03/15/2022    4:09 AM 03/14/2022    8:00 AM  CBC  WBC 4.0 - 10.5 K/uL 6.8  9.2  12.9   Hemoglobin 12.0 - 15.0 g/dL 11.2   10.4  11.1   Hematocrit 36.0 - 46.0 % 34.4  32.0  34.5   Platelets 150 - 400 K/uL 241  184  193       Latest Ref Rng & Units 03/19/2022    4:31 AM 03/18/2022    4:30 AM 03/17/2022    4:28 AM  CMP  Glucose 70 - 99 mg/dL 114  93  109   BUN 8 - 23 mg/dL 47  44  40   Creatinine 0.44 - 1.00 mg/dL 2.74  2.75  2.74   Sodium 135 - 145 mmol/L 154  152  149   Potassium 3.5 - 5.1 mmol/L 3.6  3.4  3.5   Chloride 98 - 111 mmol/L 125  123  118   CO2 22 - 32 mmol/L '22  21  22   '$ Calcium 8.9 - 10.3 mg/dL 9.7  9.7  10.1      Imaging studies: No new pertinent imaging studies   Assessment/Plan: (ICD-10's: K35.7) 71 y.o. female with likely ileus, SBO possible but with continued bowel function this is less likely   - I will cautiously allow her to have sips of CLD very slowly as she is having bowel function and remained without nausea.emesis - I think we can hold off on NGT placement unless symptoms return/worsen - No surgical intervention - Continue IVF support    - Monitor abdominal examination; on-going bowel function - Pain control prn; antiemetics    - Mobilize as feasible   - Further management per primary service; we will follow    All of the above findings and  recommendations were discussed with the patient, and the medical team, and all of patient's questions were answered to her expressed satisfaction.  -- Edison Simon, PA-C [facility] 03/19/2022, 9:18 AM M-F: 7am - 4pm

## 2022-03-19 NOTE — Progress Notes (Signed)
Progress Note   Patient: Leslie Houston [id number]:[id number] DOB: 07-Jan-1951 DOA: 03/14/2022     5 DOS: the patient was seen and examined on 03/19/2022   Brief hospital course: Leslie Houston is a 71 y.o. 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  She presented to the ED via EMS on 03/14/2022 for evaluation of severe abdominal pain with associated with nausea and emesis.   Patient was found to have partial SBO vs ileus. She was started on empiric IV antibiotics due to concern for UTI.    Urology and General Surgery are consulted.     Assessment and Plan: * SBO (small bowel obstruction) ([place]) Presented with diffuse abdominal pain, N/V and constipation.  CT scan of abdomen and pelvis showed a few dilated loops of proximal small bowel within the left hemi abdomen gradually taper to the level of the right lower quadrant, where these bowel loops appear to tether to the uterus in the right adnexa.  Findings are suggestive of ileus versus partial small bowel obstruction.  11/18 -- passing gas, pt reported small BM.  NG tube removed by surgery. Started clear liquid diet.  Later had recurrent vomiting & NPO status was resumed. 11/19 -- NG remains out, N/V improved. Remains NPO ex sips w meds 11/20 -- resume clear liquids --General surgery following --On CLD, advancement of diet per surgery --NG tube placed 11/16, removed 11/18 --Serial KUB's --Monitor abdominal exam & bowel function  --PRN antiemetics & pain control --On D5w fluids for hypernatremia  UTI (urinary tract infection) Versus abnormal UA due to colonization and recent instrumentation.  Started on empiric IV antibiotics on  admission.  Urine culture negative. Stop antibiotics and monitor clinically. Follow up pending blood culture (no growth to date).  Hypernatremia Na 147 >> 149 >> 152 >> 154, due to NPO status, free water deficit. Has been on D5w fluids past couple days, but appears IV access issues led to it being off much of yesterday until 6pm --Continue D5w with K-Cl @ 100 cc/hr --Resumption of diet per surgery  --Encourage pt to drink water once taking PO --Daily BMP  SIRS (systemic inflammatory response syndrome) (HCC) Presented with tachypnea and leukocytosis in the setting of partial SBO.  There was concern for UTI given recent instrumentation and SP catheter placement, but urine culture was negative.   Clinically doubt sepsis.  Partial intestinal obstruction (HCC) See partial SBO and surgery's recommendations  Obesity (BMI 30-39.9) BMI 43.32 Complicates overall prognosis and care Lifestyle modification and exercise has been discussed with patient  Urinary retention Status post recent suprapubic and ureteral catheterization on 11/13 with Dr. Erlene Quan. Plan was to leave ureteral Foley in for 1 week to allow urethral healing with a patent tract. We will consult urology for evaluation of severe suprapubic pain. --Urology is following --Foley removed by Dr. Erlene Quan 11/20 --Suprapubic catheter to remain in place --Outpatient urology follow up as scheduled  Seizures ([place]) Continue IV Keppra until taking PO. Seizure precautions  CKD (chronic kidney disease) stage 4, GFR 15-29 ml/min (HCC) Renal function appears stable.   Monitor renal function closely. Consult nephrology consult if worsening of renal function  Hemiparesis affecting right side as late effect of cerebrovascular accident (CVA) ([place]) History of prior CVA with left-sided hemiparesis Resume  aspirin once patient is able to tolerate p.o.        Subjective: Pt seen awake sitting up in bed this AM.  She was resumed on clear  liquids, reports tolerating well so far, and is taking it easy sipping slowly this time.  No gas or BM yet this AM, but has had ongoing bowel activity. No abdominal apin    Physical Exam: Vitals:   03/18/22 2031 03/19/22 0347 03/19/22 0724 03/19/22 1106  BP: (!) 155/50 (!) 147/40 (!) 173/64 (!) 162/55  Pulse: (!) 48 (!) 51 (!) 51 (!) 56  Resp: '20 18 16 20  '$ Temp: 98 F (36.7 C) 98.3 F (36.8 C) 98.3 F (36.8 C) 98.1 F (36.7 C)  TempSrc: Oral Oral    SpO2: 95% 94% 96% 96%  Weight:      Height:       General exam: awake, alert, no acute distress HEENT: NG tube present, moist mucus membranes, hearing grossly normal  Respiratory system: on room air, normal respiratory effort. Cardiovascular system: RRR, no pedal edema.   Gastrointestinal system: soft, non-tender, mild distention, hypoactive bowel sounds Central nervous system: A&O x3. no gross focal neurologic deficits, normal speech Extremities: moves all, SCD's on BLE's, no edema, normal tone Skin: dry, intact, normal temperature Psychiatry: normal mood, congruent affect, judgement and insight appear normal   Data Reviewed:  Notable labs --- Na 147 >> 149 >> 152 >> 154,  Cr stable 2.74, Cl 125, BUN 47, GFR 18  No new imaging studies thus far today.   Family Communication: Updated daughter by phone this AM (11/20).  Disposition: Status is: Inpatient  Remains inpatient appropriate because: recurrent SBO signs/symptoms after NG removed yesterday.  Electrolyte derangements requiring IV fluids.     Planned Discharge Destination: Home    Time spent: 35 minutes  Author: Ezekiel Slocumb, DO 03/19/2022 2:25 PM  For on call review [email or web address].

## 2022-03-20 ENCOUNTER — Other Ambulatory Visit: Payer: Medicare Other

## 2022-03-20 DIAGNOSIS — K56609 Unspecified intestinal obstruction, unspecified as to partial versus complete obstruction: Secondary | ICD-10-CM | POA: Diagnosis not present

## 2022-03-20 DIAGNOSIS — R339 Retention of urine, unspecified: Secondary | ICD-10-CM | POA: Diagnosis not present

## 2022-03-20 DIAGNOSIS — N3592 Unspecified urethral stricture, female: Secondary | ICD-10-CM | POA: Diagnosis not present

## 2022-03-20 DIAGNOSIS — K5669 Other partial intestinal obstruction: Secondary | ICD-10-CM | POA: Diagnosis not present

## 2022-03-20 DIAGNOSIS — R1084 Generalized abdominal pain: Secondary | ICD-10-CM | POA: Diagnosis not present

## 2022-03-20 LAB — BASIC METABOLIC PANEL
Anion gap: 6 (ref 5–15)
BUN: 36 mg/dL — ABNORMAL HIGH (ref 8–23)
CO2: 20 mmol/L — ABNORMAL LOW (ref 22–32)
Calcium: 9.5 mg/dL (ref 8.9–10.3)
Chloride: 120 mmol/L — ABNORMAL HIGH (ref 98–111)
Creatinine, Ser: 2.4 mg/dL — ABNORMAL HIGH (ref 0.44–1.00)
GFR, Estimated: 21 mL/min — ABNORMAL LOW (ref >60)
Glucose, Bld: 112 mg/dL — ABNORMAL HIGH (ref 70–99)
Potassium: 4.2 mmol/L (ref 3.5–5.1)
Sodium: 146 mmol/L — ABNORMAL HIGH (ref 135–145)

## 2022-03-20 LAB — MAGNESIUM: Magnesium: 2.6 mg/dL — ABNORMAL HIGH (ref 1.7–2.4)

## 2022-03-20 MED ORDER — DEXTROSE 5 % IV SOLN: INTRAVENOUS | Status: DC

## 2022-03-20 NOTE — Progress Notes (Signed)
Urology Inpatient Progress Note  Subjective: No acute events overnight.  She is afebrile, VSS. Creatinine down today, 2.40. Suprapubic catheter in place draining clear, yellow urine. She is accompanied today at the bedside by her daughter, Leslie Houston.  Patient reports improvement in her abdominal pain and states that her suprapubic catheter is no longer uncomfortable.  They have noticed some urine draining from around the suprapubic catheter and some odor associated with this; they are concerned this may be associated with her ileus vs SBO. They also wish to glean our opinion regarding the possibility of a bowel injury.  Anti-infectives: Anti-infectives (From admission, onward)    Start     Dose/Rate Route Frequency Ordered Stop   03/15/22 1000  ceFEPIme (MAXIPIME) 2 g in sodium chloride 0.9 % 100 mL IVPB  Status:  Discontinued        2 g 200 mL/hr over 30 Minutes Intravenous Every 24 hours 03/14/22 1301 03/15/22 0915   03/14/22 0815  vancomycin (VANCOCIN) IVPB 1000 mg/200 mL premix        1,000 mg 200 mL/hr over 60 Minutes Intravenous  Once 03/14/22 0810 03/14/22 0950   03/14/22 0815  ceFEPIme (MAXIPIME) 2 g in sodium chloride 0.9 % 100 mL IVPB        2 g 200 mL/hr over 30 Minutes Intravenous  Once 03/14/22 0810 03/14/22 0925       Current Facility-Administered Medications  Medication Dose Route Frequency Provider Last Rate Last Admin   acetaminophen (TYLENOL) tablet 650 mg  650 mg Oral Q6H PRN Agbata, Tochukwu, MD   650 mg at 03/15/22 0043   Chlorhexidine Gluconate Cloth 2 % PADS 6 each  6 each Topical Q0600 Agbata, Tochukwu, MD   6 each at 03/20/22 0817   dextrose 5 % solution   Intravenous Continuous Nicole Kindred A, DO       hydrALAZINE (APRESOLINE) injection 10 mg  10 mg Intravenous Q4H PRN Nicole Kindred A, DO   10 mg at 03/17/22 1818   HYDROmorphone (DILAUDID) injection 0.5 mg  0.5 mg Intravenous Q4H PRN Agbata, Tochukwu, MD   0.5 mg at 03/15/22 0418   levETIRAcetam (KEPPRA)  IVPB 1000 mg/100 mL premix  1,000 mg Intravenous BID Agbata, Tochukwu, MD 400 mL/hr at 03/20/22 1439 Infusion Verify at 03/20/22 1439   ondansetron (ZOFRAN) tablet 4 mg  4 mg Oral Q6H PRN Agbata, Tochukwu, MD       Or   ondansetron (ZOFRAN) injection 4 mg  4 mg Intravenous Q6H PRN Agbata, Tochukwu, MD   4 mg at 03/19/22 1533   pantoprazole (PROTONIX) injection 40 mg  40 mg Intravenous Q24H Agbata, Tochukwu, MD   40 mg at 03/20/22 1401   phenol (CHLORASEPTIC) mouth spray 1 spray  1 spray Mouth/Throat PRN Rometta Emery, RN       prochlorperazine (COMPAZINE) injection 10 mg  10 mg Intravenous Q6H PRN Nicole Kindred A, DO   10 mg at 03/20/22 0255   tamsulosin (FLOMAX) capsule 0.4 mg  0.4 mg Oral Daily Agbata, Tochukwu, MD   0.4 mg at 03/20/22 0817     Objective: Vital signs in last 24 hours: Temp:  [97.8 F (36.6 C)-98.2 F (36.8 C)] 98.2 F (36.8 C) (11/21 0751) Pulse Rate:  [47-66] 66 (11/21 0751) Resp:  [18] 18 (11/21 0751) BP: (145-154)/(59-64) 145/59 (11/21 0751) SpO2:  [96 %-100 %] 100 % (11/21 0751)  Intake/Output from previous day: 11/20 0701 - 11/21 0700 In: 2049.1 [I.V.:1449.1; IV Piggyback:600] Out: 1150 [Emesis/NG output:1150]  Intake/Output this shift: Total I/O In: 1336.5 [I.V.:96.5; IV Piggyback:1240] Out: -   Physical Exam Vitals and nursing note reviewed.  Constitutional:      General: She is not in acute distress.    Appearance: She is not ill-appearing, toxic-appearing or diaphoretic.  HENT:     Head: Normocephalic and atraumatic.  Pulmonary:     Effort: Pulmonary effort is normal. No respiratory distress.  Abdominal:     Palpations: Abdomen is soft.     Tenderness: There is no abdominal tenderness.  Skin:    General: Skin is warm and dry.  Neurological:     Mental Status: She is alert and oriented to person, place, and time.  Psychiatric:        Mood and Affect: Mood normal.        Behavior: Behavior normal.    BMET Recent Labs     03/19/22 0431 03/20/22 0646  NA 154* 146*  K 3.6 4.2  CL 125* 120*  CO2 22 20*  GLUCOSE 114* 112*  BUN 47* 36*  CREATININE 2.74* 2.40*  CALCIUM 9.7 9.5   Assessment & Plan: 71 year old female with chronic urinary retention and urethral stricture who underwent pelvic exam under anesthesia with suprapubic catheter placement and urethral dilation with Dr. Erlene Quan 8 days ago now admitted with possible UTI versus urosepsis and ileus versus partial SBO.  Her abdominal exam has improved and she is no longer reporting pain at her SPT site.  I reassured her and Leslie Houston that I do not think her urinary leakage or odor are related to her prolonged GI course.  I had a lengthy conversation with the patient, Leslie Houston, and caregiver Leslie Houston via telephone today.  We discussed at length that I absolutely agree with general surgery's management of her ileus versus partial SBO and I also agree that I do not think a bowel injury is likely, given her improved abdominal exam, afebrile status, and no evidence of free fluid or gas on abdominal imaging.  We will continue to defer to general surgery for management of her ileus versus SBO and appreciate their recommendations.  I was very frank with them that while they prefer to defer surgery for now, if she fails to have meaningful improvement in the coming days, we may need to seriously revisit this.  They expressed understanding.  Please contact urology with any immediate concerns.  Debroah Loop, PA-C 03/20/2022

## 2022-03-20 NOTE — Progress Notes (Signed)
[facility] SURGICAL PROGRESS NOTE (cpt [id number])  Hospital Day(s): 6.   Interval History: Patient seen and examined. Again, noted episode of emesis yesterday afternoon after restarting CLD. Seems to have waxing and waning course to this point. This morning, patient does not look as well clinically as she has previous days. She is endorsing waxing and waning abdominal pains and states "her intestines are waking up." She denied any additional episodes of nausea but that she "feels queasy." No fever, chills. No new imaging today. No reports of further bowel movements.   Review of Systems:  Constitutional: denies fever, chills  HEENT: denies cough or congestion  Respiratory: denies any shortness of breath  Cardiovascular: denies chest pain or palpitations  Gastrointestinal: + abdominal pain, + nausea, denied emesis  Genitourinary: denies burning with urination or urinary frequency Musculoskeletal: denies pain, decreased motor or sensation  Vital signs in last 24 hours: [min-max] current  Temp:  [97.8 F (36.6 C)-98.2 F (36.8 C)] 98.2 F (36.8 C) (11/21 0751) Pulse Rate:  [47-66] 66 (11/21 0751) Resp:  [17-20] 18 (11/21 0751) BP: (145-162)/(55-64) 145/59 (11/21 0751) SpO2:  [96 %-100 %] 100 % (11/21 0751)     Height: '5\' 11"'$  (180.3 cm) Weight: 85.3 kg BMI (Calculated): 35.54   Intake/Output last 2 shifts:  11/20 0701 - 11/21 0700 In: 2049.1 [I.V.:1449.1; IV Piggyback:600] Out: 1150 [Emesis/NG output:1150]   Physical Exam:  Constitutional: alert, cooperative and no distress  HENT: normocephalic without obvious abnormality  Eyes: PERRL, EOM's grossly intact and symmetric  Respiratory: breathing non-labored at rest  Cardiovascular: regular rate and sinus rhythm  Gastrointestinal: Soft, she is mildly tender but exam is difficult, she certainly remains distended and tympanic, no rebound/guarding. I do not feel she is peritonitic Musculoskeletal: no edema or wounds, motor  and sensation grossly intact, NT    Labs:     Latest Ref Rng & Units 03/16/2022    5:55 AM 03/15/2022    4:09 AM 03/14/2022    8:00 AM  CBC  WBC 4.0 - 10.5 K/uL 6.8  9.2  12.9   Hemoglobin 12.0 - 15.0 g/dL 11.2  10.4  11.1   Hematocrit 36.0 - 46.0 % 34.4  32.0  34.5   Platelets 150 - 400 K/uL 241  184  193       Latest Ref Rng & Units 03/20/2022    6:46 AM 03/19/2022    4:31 AM 03/18/2022    4:30 AM  CMP  Glucose 70 - 99 mg/dL 112  114  93   BUN 8 - 23 mg/dL 36  47  44   Creatinine 0.44 - 1.00 mg/dL 2.40  2.74  2.75   Sodium 135 - 145 mmol/L 146  154  152   Potassium 3.5 - 5.1 mmol/L 4.2  3.6  3.4   Chloride 98 - 111 mmol/L 120  125  123   CO2 22 - 32 mmol/L '20  22  21   '$ Calcium 8.9 - 10.3 mg/dL 9.5  9.7  9.7      Imaging studies: No new pertinent imaging studies   Assessment/Plan: (ICD-10's: K56.7) 71 y.o. female with question of waxing and waning ileus vs partial small bowel obstruction    - At this point, she continues to fluctuate clinically. Today, she seems much less improved and continues to have significant distension on examination without significant bowel function. I do think we are at the point where we need to strongly consider LOA. Will defer to Dr  Rodenberg. - I discussed further attempts with NGT decompression to try and avoid surgery but she adamantly denied this and became tearful  - Continue IVF support    - Monitor abdominal examination; on-going bowel function - Pain control prn; antiemetics    - Mobilize as feasible   - Further management per primary service; we will follow    All of the above findings and recommendations were discussed with the patient, and the medical team, and all of patient's questions were answered to her expressed satisfaction.  -- Edison Simon, PA-C [facility] 03/20/2022, 8:38 AM M-F: 7am - 4pm

## 2022-03-20 NOTE — Progress Notes (Signed)
Progress Note   Patient: Leslie Houston [id number] DOB: [birth year] DOA: 03/14/2022     6 DOS: the patient was seen and examined on 03/20/2022   Brief hospital course: Leslie Houston is a 71 y.o. 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  She presented to the ED via EMS on 03/14/2022 for evaluation of severe abdominal pain with associated with nausea and emesis.   Patient was found to have partial SBO vs ileus. She was started on empiric IV antibiotics due to concern for UTI.    Urology and General Surgery are consulted.     Assessment and Plan: * Small bowel obstruction ([place]) Presented with diffuse abdominal pain, N/V and constipation.  CT scan of abdomen and pelvis showed a few dilated loops of proximal small bowel within the left hemi abdomen gradually taper to the level of the right lower quadrant, where these bowel loops appear to tether to the uterus in the right adnexa.  Findings are suggestive of ileus versus partial small bowel obstruction.  11/18 >> 21 --patient continues to have recurrent obstructive symptoms when resumed on any p.o. intake, refuses to have NG tube replaced --General surgery following -- N.p.o. as of at this morning, diet per surgery --NG tube placed 11/16, removed 11/18 --Serial KUB's --Surgery considering repeat CT scan with p.o. and IV contrast --Monitor abdominal exam & bowel function  --PRN antiemetics & pain control --On D5w fluids for hypernatremia  UTI (urinary tract infection) Versus abnormal UA due to colonization and recent instrumentation.  Started on empiric IV antibiotics on admission.  Urine culture negative. Stop  antibiotics and monitor clinically. Follow up pending blood culture (no growth to date).  Hypernatremia Na 147 >> 149 >> 152 >> 154 >> 46, due to NPO status, free water deficit. --Continue D5W at 100 cc/h --Resumption of diet per surgery  --Encourage pt to drink water once taking PO --Daily BMP  SIRS (systemic inflammatory response syndrome) (HCC) Presented with tachypnea and leukocytosis in the setting of partial SBO.  There was concern for UTI given recent instrumentation and SP catheter placement, but urine culture was negative.   Clinically doubt sepsis.  Partial intestinal obstruction (HCC) See partial SBO and surgery's recommendations  Obesity (BMI 30-39.9) BMI 48.18 Complicates overall prognosis and care Lifestyle modification and exercise has been discussed with patient  Urinary retention Status post recent suprapubic and ureteral catheterization on 11/13 with Dr. Erlene Quan. Plan was to leave ureteral Foley in for 1 week to allow urethral healing with a patent tract. --Urology is following --Foley removed by Dr. Erlene Quan 11/20 --Suprapubic catheter to remain in place --Outpatient urology follow up as scheduled  11/21: Suprapubic catheter leaking around the tube, soaking dressings, gown and linens.  Urology updated and aware, plan to evaluate at bedside later this afternoon.  Per Dr. Erlene Quan, this may be normal and related to bladder spasms.  Most important thing is urine draining through the tube itself.  Seizures ([place]) Continue IV Keppra until taking PO. Seizure precautions  CKD (chronic kidney disease) stage 4, GFR 15-29 ml/min (HCC) Renal function appears stable, has improved since admission.   Monitor renal function closely. Consult nephrology consult if worsening.  Hemiparesis affecting right side as late effect of cerebrovascular accident (CVA) ([place]) History of  prior CVA with left-sided hemiparesis Resume aspirin once patient is able to tolerate p.o.         Subjective: Pt was awake but drowsy, sitting up in bed when seen around today.  She had recurrent nausea vomiting yesterday and again had to be made n.p.o. status.  She expresses frustration about her clinical course and this recurring problem.  She also notes that her suprapubic catheter is leaking, her gown is soaked in urine at that area, requests to see urologist.  No other acute complaints at this time.   Physical Exam: Vitals:   03/19/22 1604 03/19/22 2030 03/20/22 0510 03/20/22 0751  BP: (!) 160/57 (!) 154/63 (!) 147/64 (!) 145/59  Pulse: 63 (!) 55 (!) 47 66  Resp: '17 18 18 18  '$ Temp: 97.8 F (36.6 C) 97.8 F (36.6 C) 97.9 F (36.6 C) 98.2 F (36.8 C)  TempSrc: Oral Oral  Oral  SpO2: 98% 97% 96% 100%  Weight:      Height:       General exam: awake, alert, no acute distress HEENT: moist mucus membranes, hearing grossly normal  Respiratory system: on room air, normal respiratory effort.  Lungs clear without wheezes or rhonchi Cardiovascular system: RRR, no pedal edema.   Gastrointestinal system: soft, mildly distended, unable to appreciate bowel sounds, mildly tender Central nervous system: A&O x3. no gross focal neurologic deficits, normal speech Extremities: moves all, SCD's on BLE's, no edema, normal tone Skin: dry, intact, normal temperature Psychiatry: normal mood, congruent affect, judgement and insight appear normal   Data Reviewed:  Notable labs --- Na Improved 154 down to 146, chloride 120 improving, bicarb 20, glucose 112, BUN 36, creatinine improved to 2.40 from 2.74, magnesium 2.6     Family Communication: Updated daughter by phone (11/20), she was updated in person by general surgery today.  Disposition: Status is: Inpatient  Remains inpatient appropriate because: Persistent bowel obstruction, n.p.o. and on IV fluids    Planned Discharge Destination: Home    Time spent: 35 minutes  Author: Ezekiel Slocumb, DO 03/20/2022 4:32 PM  For on call  review [email or web address].

## 2022-03-20 NOTE — Plan of Care (Signed)

## 2022-03-20 NOTE — Progress Notes (Signed)
Brief Progress Note Asked to return to bedside to discuss case and plan of care with the patient's daughter. I did my best to explain the patient's current course and work up to this point. She understands her mother's presentation has not been clear cut in  the sense that she will clinically improve, have bowel function, and then when we trial a diet she will do well at first but ultimately will regress. I still feel that adynamic ileus is the most likely explanation. Partial small bowel obstruction is certainly a possibility. Daughter was very concerned over a possible bowel injury; however, I think this extremely unlikely given that she has never had evidence of pneumoperitoneum, pneumatosis, her abdominal examination has remained without evidence of peritonitis, and her labs and hemodynamics have been reassuring.   This afternoon, patient appears much more comfortable compared to this morning's evaluation. She denied any nausea or emesis. Her abdomen is much softer, remains benign. She has had 2 bowel movements since this morning as well.   I had a long discussion regarding options moving forward. For now, we will re-attempt to initiate diet with CLD. Encouraged her to go slow and watch for fullness, nausea, distension. If she does well we will proceed with diet advancement as we usually would. If at any time she fails this, I believe a reasonable next step would be repeating CT Abdomen/Pelvis with contrast (IV & PO). I discussed a role for laparoscopy ad LOA in this scenario but patient and her daughter are very reluctant, understandably. We will continue our best efforts conservatively but we are running out of options. Patient adamantly refuses replacement of NGT.   I believe at the end of our discussion, patient and her daughter's questions were addressed and answered.   -- Edison Simon, PA-C [facility] 03/20/2022, 1:41 PM M-F: 7am - 4pm

## 2022-03-20 NOTE — Progress Notes (Signed)
Physical Therapy Treatment Patient Details Name: Leslie Houston MRN: [id number] DOB: [birth year] Today's Date: 03/20/2022   History of Present Illness Pt is a 71 year old female admitted with severe abdominal pain with associated with nausea and emesis. Patient was found to have partial SBO vs ileus; PMH significant for dabetes mellitus with complications of stage IV chronic kidney disease, depression, history of prior CVA with residual hearing loss, coronary artery disease, anxiety disorder, status post recent suprapubic catheter placement for chronic urinary retention and urethral dilation for stricture with urology on 11/13    PT Comments    Patient seen and agreeable to PT session this date. Patient eager to mobilize but reports mild leaking from suprapubic catheter. Able to stand from EOB with supervision. Ambulated extended hallway distance with RW and supervision. VSS on RA. Patient reporting improved activity tolerance from previous session. Instructed daughter and patient on standing marching and sit to stand as daughter prefers patient OOB to promote bowel function. D/c plan remains appropriate.     Recommendations for follow up therapy are one component of a multi-disciplinary discharge planning process, led by the attending physician.  Recommendations may be updated based on patient status, additional functional criteria and insurance authorization.  Follow Up Recommendations  Home health PT     Assistance Recommended at Discharge PRN  Patient can return home with the following A little help with walking and/or transfers;A little help with bathing/dressing/bathroom   Equipment Recommendations  Other (comment) (has equipment needed)    Recommendations for Other Services       Precautions / Restrictions Precautions Precautions: Fall Precaution Comments: suprapubic cath Restrictions Weight Bearing Restrictions: No     Mobility  Bed Mobility                General bed mobility comments: sitting EOB on arrival    Transfers Overall transfer level: Needs assistance Equipment used: Rolling Lakiah Dhingra (2 wheels) Transfers: Sit to/from Stand Sit to Stand: Supervision           General transfer comment: able to stand from EOB with supervision for safety    Ambulation/Gait Ambulation/Gait assistance: Supervision Gait Distance (Feet): 320 Feet Assistive device: Rolling Brennen Gardiner (2 wheels) Gait Pattern/deviations: Step-through pattern, Decreased stride length Gait velocity: adequate for age     General Gait Details: gait speed adequate for age. No overt LOB. Supervision for safety. Prefers use of RW for stability and comfort   Stairs             Wheelchair Mobility    Modified Rankin (Stroke Patients Only)       Balance Overall balance assessment: Needs assistance Sitting-balance support: No upper extremity supported, Feet supported Sitting balance-Leahy Scale: Good     Standing balance support: Bilateral upper extremity supported Standing balance-Leahy Scale: Fair                              Cognition Arousal/Alertness: Awake/alert Behavior During Therapy: WFL for tasks assessed/performed Overall Cognitive Status: Within Functional Limits for tasks assessed                                          Exercises Other Exercises Other Exercises: instructed on standing marching and sit to stand as daughter would prefer patient OOB to encourage bowel function    General Comments  Pertinent Vitals/Pain Pain Assessment Pain Assessment: No/denies pain    Home Living                          Prior Function            PT Goals (current goals can now be found in the care plan section) Acute Rehab PT Goals Patient Stated Goal: to return home PT Goal Formulation: With patient Time For Goal Achievement: 04/01/22 Potential to Achieve Goals: Good Progress towards PT  goals: Progressing toward goals    Frequency    Min 2X/week      PT Plan Current plan remains appropriate    Co-evaluation              AM-PAC PT "6 Clicks" Mobility   Outcome Measure  Help needed turning from your back to your side while in a flat bed without using bedrails?: None Help needed moving from lying on your back to sitting on the side of a flat bed without using bedrails?: None Help needed moving to and from a bed to a chair (including a wheelchair)?: A Little Help needed standing up from a chair using your arms (e.g., wheelchair or bedside chair)?: A Little Help needed to walk in hospital room?: A Little Help needed climbing 3-5 steps with a railing? : A Little 6 Click Score: 20    End of Session Equipment Utilized During Treatment: Gait belt Activity Tolerance: Patient tolerated treatment well Patient left: in bed;with call bell/phone within reach;with family/visitor present (seated EOB) Nurse Communication: Mobility status PT Visit Diagnosis: Muscle weakness (generalized) (M62.81);Difficulty in walking, not elsewhere classified (R26.2)     Time: 3295-1884 PT Time Calculation (min) (ACUTE ONLY): 30 min  Charges:  $Therapeutic Activity: 23-37 mins                     Billie Trager A. Gilford Rile PT, DPT Acute Rehabilitation Services Office [phone]    Linna Hoff 03/20/2022, 4:33 PM

## 2022-03-21 ENCOUNTER — Inpatient Hospital Stay: Payer: Medicare Other

## 2022-03-21 DIAGNOSIS — N390 Urinary tract infection, site not specified: Secondary | ICD-10-CM

## 2022-03-21 DIAGNOSIS — T83510D Infection and inflammatory reaction due to cystostomy catheter, subsequent encounter: Secondary | ICD-10-CM

## 2022-03-21 DIAGNOSIS — K56609 Unspecified intestinal obstruction, unspecified as to partial versus complete obstruction: Secondary | ICD-10-CM | POA: Diagnosis not present

## 2022-03-21 DIAGNOSIS — R1084 Generalized abdominal pain: Secondary | ICD-10-CM | POA: Diagnosis not present

## 2022-03-21 DIAGNOSIS — R569 Unspecified convulsions: Secondary | ICD-10-CM

## 2022-03-21 DIAGNOSIS — K5669 Other partial intestinal obstruction: Secondary | ICD-10-CM | POA: Diagnosis not present

## 2022-03-21 DIAGNOSIS — R339 Retention of urine, unspecified: Secondary | ICD-10-CM | POA: Diagnosis not present

## 2022-03-21 LAB — BASIC METABOLIC PANEL
Anion gap: 3 — ABNORMAL LOW (ref 5–15)
BUN: 30 mg/dL — ABNORMAL HIGH (ref 8–23)
CO2: 19 mmol/L — ABNORMAL LOW (ref 22–32)
Calcium: 9.3 mg/dL (ref 8.9–10.3)
Chloride: 117 mmol/L — ABNORMAL HIGH (ref 98–111)
Creatinine, Ser: 2.42 mg/dL — ABNORMAL HIGH (ref 0.44–1.00)
GFR, Estimated: 21 mL/min — ABNORMAL LOW (ref >60)
Glucose, Bld: 113 mg/dL — ABNORMAL HIGH (ref 70–99)
Potassium: 3.6 mmol/L (ref 3.5–5.1)
Sodium: 139 mmol/L (ref 135–145)

## 2022-03-21 MED ORDER — IOHEXOL 9 MG/ML PO SOLN: 500.0000 mL | Freq: Once | ORAL | Status: DC | PRN

## 2022-03-21 NOTE — Plan of Care (Signed)

## 2022-03-21 NOTE — Progress Notes (Signed)
Progress Note   Patient: Leslie Houston SHF:[id number] DOB: 1950/05/22 DOA: 03/14/2022     7 DOS: the patient was seen and examined on 03/21/2022   Brief hospital course: Leslie Houston is a 71 y.o. 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  She presented to the ED via EMS on 03/14/2022 for evaluation of severe abdominal pain with associated with nausea and emesis.   Patient was found to have partial SBO vs ileus. She was started on empiric IV antibiotics due to concern for UTI.    Urology and General Surgery are consulted.   11/22: CT abdomen/pelvis with p.o. contrast today per surgery    Assessment and Plan: * Small bowel obstruction ([place]) Presented with diffuse abdominal pain, N/V and constipation.  CT scan of abdomen and pelvis showed a few dilated loops of proximal small bowel within the left hemi abdomen gradually taper to the level of the right lower quadrant, where these bowel loops appear to tether to the uterus in the right adnexa.  Findings are suggestive of ileus versus partial small bowel obstruction.  11/18 >> 21 --patient continues to have recurrent obstructive symptoms when resumed on any p.o. intake, refuses to have NG tube replaced --General surgery following -- N.p.o. as of at this morning, diet per surgery --NG tube placed 11/16, removed 11/18 --Serial KUB's --Surgery getting repeat CT scan with contrast today, on 11/22 --Monitor abdominal exam & bowel function  --PRN antiemetics & pain control --On D5w fluids for hypernatremia  11/22 - patient vomited 300 cc total of yellow/green material this morning.  Zofran helped  UTI (urinary tract  infection) Versus abnormal UA due to colonization and recent instrumentation.  Started on empiric IV antibiotics on admission.  Urine culture negative. Stop antibiotics and monitor clinically. Follow up pending blood culture (no growth to date)  Hypernatremia Na 147 >> 149 >> 152 >> 154 >> 139, due to NPO status, free water deficit. --Continue D5W at 100 cc/h --Resumption of diet per surgery  --Encourage pt to drink water once taking PO --Daily BMP  SIRS (systemic inflammatory response syndrome) (HCC) Presented with tachypnea and leukocytosis in the setting of partial SBO.  There was concern for UTI given recent instrumentation and SP catheter placement, but urine culture was negative.   Sepsis ruled out  Partial intestinal obstruction (HCC) See partial SBO and surgery's recommendations  Obesity (BMI 30-39.9) BMI 50.27 Complicates overall prognosis and care Lifestyle modification and exercise has been discussed with patient.  Urinary retention Status post recent suprapubic and ureteral catheterization on 11/13 with Dr. Erlene Quan. Plan was to leave ureteral Foley in for 1 week to allow urethral healing with a patent tract. --Urology is following --Foley removed by Dr. Erlene Quan 11/20 --Suprapubic catheter to remain in place --Outpatient urology follow up as scheduled -Continue Flomax  11/21: Suprapubic catheter leaking around the tube, soaking dressings, gown and linens.  Urology do not think a bowel injury is likely. Per Dr. Erlene Quan, this may be normal and related to bladder spasms.  Most important thing is urine draining through the tube itself.  Seizures ([place]) Continue IV Keppra until taking PO. Seizure precautions.  CKD (chronic kidney disease) stage 4, GFR 15-29 ml/min (HCC) Renal function appears stable, has improved since admission.  Monitor renal function closely. Consult nephrology consult if worsening  Hemiparesis affecting right side as late effect of  cerebrovascular accident (CVA) ([place]) History of prior CVA with left-sided hemiparesis Resume aspirin once patient is able to tolerate p.o.        Subjective: Sitting in the chair.  She threw up 300 cc total of yellow/green vomit earlier.  Zofran helped.  She is hoping to get some answer with the CT scan  Physical Exam: Vitals:   03/20/22 0751 03/20/22 2040 03/21/22 0512 03/21/22 0809  BP: (!) 145/59 (!) 148/68 (!) 115/52 (!) 108/59  Pulse: 66 (!) 51 (!) 54 (!) 57  Resp: '18 18 18 17  '$ Temp: 98.2 F (36.8 C) 98.3 F (36.8 C) 98 F (36.7 C) 98.6 F (37 C)  TempSrc: Oral   Oral  SpO2: 100% 96% 96% 96%  Weight:      Height:       71 year old female sitting in the bed comfortably without acute distress Lungs clear to auscultation bilaterally Cardiovascular regular rate and rhythm Abdomen soft nontender.  She does have mild distention but no rebound or guarding GU: Suprapubic catheter in place Neuro alert and awake, at baseline Psych normal mood and affect Data Reviewed:  Creatinine 2.42  Family Communication: None today  Disposition: Status is: Inpatient Remains inpatient appropriate because: Further management of suspected partial SBO  Planned Discharge Destination: Home   DVT prophylaxis-SCDs Time spent: 35 minutes  Author: Max Sane, MD 03/21/2022 12:56 PM  For on call review [email or web address].

## 2022-03-21 NOTE — Progress Notes (Signed)
Physical Therapy Treatment Patient Details Name: Leslie Houston MRN: [id number] DOB: Aug 20, 1950 Today's Date: 03/21/2022   History of Present Illness Pt is a 71 year old female admitted with severe abdominal pain with associated with nausea and emesis. Patient was found to have partial SBO vs ileus; PMH significant for dabetes mellitus with complications of stage IV chronic kidney disease, depression, history of prior CVA with residual hearing loss, coronary artery disease, anxiety disorder, status post recent suprapubic catheter placement for chronic urinary retention and urethral dilation for stricture with urology on 11/13    PT Comments    Patient agreeable to PT session. Concerned about suprapubic catheter leaking/smell/sore, notified RN. Able to perform bed mobility modI and sit to stand with supervision. Ambulated 320' with RW and supervision with no LOB. Able to recall 3 step instructions to return to room. Performed seated LE strengthening exercises and sit to stand x 5 from low bed surface. Left sitting EOB for OT session. D/c plan remains appropriate.     Recommendations for follow up therapy are one component of a multi-disciplinary discharge planning process, led by the attending physician.  Recommendations may be updated based on patient status, additional functional criteria and insurance authorization.  Follow Up Recommendations  Home health PT     Assistance Recommended at Discharge PRN  Patient can return home with the following A little help with walking and/or transfers;A little help with bathing/dressing/bathroom   Equipment Recommendations  Other (comment) (has equipment)    Recommendations for Other Services       Precautions / Restrictions Precautions Precautions: Fall Precaution Comments: suprapubic cath Restrictions Weight Bearing Restrictions: No     Mobility  Bed Mobility Overal bed mobility: Modified Independent             General bed  mobility comments: left seated EOB for OT session    Transfers Overall transfer level: Needs assistance Equipment used: Rolling Farzad Tibbetts (2 wheels) Transfers: Sit to/from Stand Sit to Stand: Supervision                Ambulation/Gait Ambulation/Gait assistance: Supervision Gait Distance (Feet): 320 Feet Assistive device: Rolling Jaslin Novitski (2 wheels) Gait Pattern/deviations: Step-through pattern, Decreased stride length Gait velocity: adequate for age     General Gait Details: improving stability with gait. Supervision for safety. Able to recall 3 step instructions to return to room   Stairs             Wheelchair Mobility    Modified Rankin (Stroke Patients Only)       Balance Overall balance assessment: Needs assistance Sitting-balance support: No upper extremity supported, Feet supported Sitting balance-Leahy Scale: Good     Standing balance support: During functional activity, Bilateral upper extremity supported Standing balance-Leahy Scale: Fair                              Cognition Arousal/Alertness: Awake/alert Behavior During Therapy: WFL for tasks assessed/performed Overall Cognitive Status: Within Functional Limits for tasks assessed                                          Exercises General Exercises - Lower Extremity Long Arc Quad: Both, 10 reps, Seated Hip Flexion/Marching: Both, 10 reps, Seated Other Exercises Other Exercises: Sit to stand x 5    General Comments  Pertinent Vitals/Pain Pain Assessment Pain Assessment: No/denies pain    Home Living                          Prior Function            PT Goals (current goals can now be found in the care plan section) Acute Rehab PT Goals PT Goal Formulation: With patient Time For Goal Achievement: 04/01/22 Potential to Achieve Goals: Good Progress towards PT goals: Progressing toward goals    Frequency    Min 2X/week       PT Plan Current plan remains appropriate    Co-evaluation              AM-PAC PT "6 Clicks" Mobility   Outcome Measure  Help needed turning from your back to your side while in a flat bed without using bedrails?: None Help needed moving from lying on your back to sitting on the side of a flat bed without using bedrails?: None Help needed moving to and from a bed to a chair (including a wheelchair)?: A Little Help needed standing up from a chair using your arms (e.g., wheelchair or bedside chair)?: A Little Help needed to walk in hospital room?: A Little Help needed climbing 3-5 steps with a railing? : A Little 6 Click Score: 20    End of Session   Activity Tolerance: Patient tolerated treatment well Patient left: in bed;with call bell/phone within reach (seated EOB for OT session) Nurse Communication: Mobility status PT Visit Diagnosis: Muscle weakness (generalized) (M62.81);Difficulty in walking, not elsewhere classified (R26.2)     Time: 4315-4008 PT Time Calculation (min) (ACUTE ONLY): 25 min  Charges:  $Therapeutic Exercise: 8-22 mins $Therapeutic Activity: 8-22 mins                     Trini Soldo A. Gilford Rile PT, DPT [facility] - Acute Rehabilitation Services    Pawel Soules A Tala Eber 03/21/2022, 3:45 PM

## 2022-03-21 NOTE — Care Management Important Message (Signed)
Important Message  Patient Details  Name: Leslie Houston MRN: [id number] Date of Birth: [birth year]   Medicare Important Message Given:  Yes     Juliann Pulse A Veronnica Hennings 03/21/2022, 1:09 PM

## 2022-03-21 NOTE — Progress Notes (Signed)
[facility] SURGICAL PROGRESS NOTE (cpt [phone])  Hospital Day(s): 7.   Interval History: Patient seen and examined. No acute events overnight. Patient reports she feels okay this morning. She is very difficult to interview. She denied any abdominal pain. She does report reflux symptoms and she did have an episode of emesis this morning although this is not documented. BMP is stable. She is on CLD. She continues to endorse flatus and have recorded bowel movements.   Review of Systems:  Constitutional: denies fever, chills  HEENT: denies cough or congestion  Respiratory: denies any shortness of breath  Cardiovascular: denies chest pain or palpitations  Gastrointestinal: Denied abdominal pain, + emesis Genitourinary: denies burning with urination or urinary frequency Musculoskeletal: denies pain, decreased motor or sensation  Vital signs in last 24 hours: [min-max] current  Temp:  [98 F (36.7 C)-98.3 F (36.8 C)] 98 F (36.7 C) (11/22 0512) Pulse Rate:  [51-66] 54 (11/22 0512) Resp:  [18] 18 (11/22 0512) BP: (115-148)/(52-68) 115/52 (11/22 0512) SpO2:  [96 %-100 %] 96 % (11/22 0512)     Height: '5\' 11"'$  (180.3 cm) Weight: 85.3 kg BMI (Calculated): 35.54   Intake/Output last 2 shifts:  11/21 0701 - 11/22 0700 In: 3725.2 [I.V.:1565.2; IV Piggyback:2160] Out: 650 [Urine:450]   Physical Exam:  Constitutional: alert, cooperative and no distress  HENT: normocephalic without obvious abnormality  Eyes: PERRL, EOM's grossly intact and symmetric  Respiratory: breathing non-labored at rest  Cardiovascular: regular rate and sinus rhythm  Gastrointestinal: Soft, does not appear tender, she certainly remains distended and tympanic, no rebound/guarding. I do not feel she is peritonitic Musculoskeletal: no edema or wounds, motor and sensation grossly intact, NT    Labs:     Latest Ref Rng & Units 03/16/2022    5:55 AM 03/15/2022    4:09 AM 03/14/2022    8:00 AM  CBC  WBC  4.0 - 10.5 K/uL 6.8  9.2  12.9   Hemoglobin 12.0 - 15.0 g/dL 11.2  10.4  11.1   Hematocrit 36.0 - 46.0 % 34.4  32.0  34.5   Platelets 150 - 400 K/uL 241  184  193       Latest Ref Rng & Units 03/21/2022    3:51 AM 03/20/2022    6:46 AM 03/19/2022    4:31 AM  CMP  Glucose 70 - 99 mg/dL 113  112  114   BUN 8 - 23 mg/dL 30  36  47   Creatinine 0.44 - 1.00 mg/dL 2.42  2.40  2.74   Sodium 135 - 145 mmol/L 139  146  154   Potassium 3.5 - 5.1 mmol/L 3.6  4.2  3.6   Chloride 98 - 111 mmol/L 117  120  125   CO2 22 - 32 mmol/L '19  20  22   '$ Calcium 8.9 - 10.3 mg/dL 9.3  9.5  9.7      Imaging studies: No new pertinent imaging studies   Assessment/Plan: (ICD-10's: K56.7) 71 y.o. female with question of waxing and waning ileus vs partial small bowel obstruction    - Her clinical picture still is not clear cut. Continues to have bowel function but continues to have emesis with just CLD. She does endorse reflux and it would be reasonable to add PPI. I will go ahead and repeat CT Abdomen/Pelvis with PO contrast today to ensure no missed intra-abdominal process. I do worry that we will need to strongly consider LOA whether laparoscopically or open. Timing to  be determined.   - Continue IVF support    - Monitor abdominal examination; on-going bowel function - Pain control prn; antiemetics    - Mobilize as feasible   - Further management per primary service; we will follow    All of the above findings and recommendations were discussed with the patient, and the medical team, and all of patient's questions were answered to her expressed satisfaction.  -- Edison Simon, PA-C [facility] 03/21/2022, 7:16 AM M-F: 7am - 4pm

## 2022-03-21 NOTE — Progress Notes (Signed)
Occupational Therapy Treatment Patient Details Name: Leslie Houston MRN: [id number] DOB: May 20, 1950 Today's Date: 03/21/2022   History of present illness Pt is a 71 year old female admitted with severe abdominal pain with associated with nausea and emesis. Patient was found to have partial SBO vs ileus; PMH significant for dabetes mellitus with complications of stage IV chronic kidney disease, depression, history of prior CVA with residual hearing loss, coronary artery disease, anxiety disorder, status post recent suprapubic catheter placement for chronic urinary retention and urethral dilation for stricture with urology on 11/13   OT comments  Pt seen for OT tx this date, after PT session. Pt seated EOB, agreeable to session and denies pain/nausea. Pt stood with supervision using the RW and stood at the sink to complete grooming tasks with intermittent forearm support on countertop for stability. Pt endorsed concern about suprapubic catheter (PT already notified RN). Pt endorsed some fatigue after PT and after grooming tasks with OT. Returned to bed, all needs in reach. Pt progressing towards goals, however continues to benefit from skilled OT services. Discharge recommendation updated to reflect pt's impairments and functional deficits to maximize return to PLOF given prolonged hospitalization. Rec HHOT.    Recommendations for follow up therapy are one component of a multi-disciplinary discharge planning process, led by the attending physician.  Recommendations may be updated based on patient status, additional functional criteria and insurance authorization.    Follow Up Recommendations  Home health OT     Assistance Recommended at Discharge Intermittent Supervision/Assistance  Patient can return home with the following  Assistance with cooking/housework;A little help with bathing/dressing/bathroom   Equipment Recommendations  Tub/shower bench    Recommendations for Other Services       Precautions / Restrictions Precautions Precautions: Fall Precaution Comments: suprapubic cath Restrictions Weight Bearing Restrictions: No       Mobility Bed Mobility Overal bed mobility: Modified Independent                  Transfers Overall transfer level: Needs assistance Equipment used: Rolling walker (2 wheels) Transfers: Sit to/from Stand Sit to Stand: Supervision                 Balance Overall balance assessment: Needs assistance Sitting-balance support: No upper extremity supported, Feet supported Sitting balance-Leahy Scale: Good     Standing balance support: Single extremity supported, No upper extremity supported, During functional activity Standing balance-Leahy Scale: Fair Standing balance comment: intermittent forearm support on countertop for grooming tasks at sink                           ADL either performed or assessed with clinical judgement   ADL Overall ADL's : Needs assistance/impaired     Grooming: Standing;Wash/dry face;Wash/dry hands;Oral care;Set up;Supervision/safety Grooming Details (indicate cue type and reason): intermittent forearm support on countertop                                    Extremity/Trunk Assessment              Vision       Perception     Praxis      Cognition Arousal/Alertness: Awake/alert Behavior During Therapy: WFL for tasks assessed/performed Overall Cognitive Status: Within Functional Limits for tasks assessed  Exercises      Shoulder Instructions       General Comments      Pertinent Vitals/ Pain       Pain Assessment Pain Assessment: No/denies pain  Home Living                                          Prior Functioning/Environment              Frequency  Min 2X/week        Progress Toward Goals  OT Goals(current goals can now be found in the care plan  section)  Progress towards OT goals: Progressing toward goals  Acute Rehab OT Goals Patient Stated Goal: get home OT Goal Formulation: With patient Time For Goal Achievement: 04/01/22 Potential to Achieve Goals: Good  Plan Discharge plan needs to be updated;Frequency remains appropriate    Co-evaluation                 AM-PAC OT "6 Clicks" Daily Activity     Outcome Measure   Help from another person eating meals?: None Help from another person taking care of personal grooming?: None Help from another person toileting, which includes using toliet, bedpan, or urinal?: A Little Help from another person bathing (including washing, rinsing, drying)?: A Little Help from another person to put on and taking off regular upper body clothing?: None Help from another person to put on and taking off regular lower body clothing?: A Little 6 Click Score: 21    End of Session Equipment Utilized During Treatment: Rolling walker (2 wheels)  OT Visit Diagnosis: Unsteadiness on feet (R26.81)   Activity Tolerance Patient tolerated treatment well   Patient Left in bed;with call bell/phone within reach;with bed alarm set   Nurse Communication          Time: 2919-1660 OT Time Calculation (min): 8 min  Charges: OT General Charges $OT Visit: 1 Visit OT Treatments $Self Care/Home Management : 8-22 mins  Ardeth Perfect., MPH, MS, OTR/L ascom [phone] 03/21/22, 3:27 PM

## 2022-03-22 ENCOUNTER — Other Ambulatory Visit: Payer: Self-pay

## 2022-03-22 ENCOUNTER — Inpatient Hospital Stay: Payer: Medicare Other | Admitting: Certified Registered"

## 2022-03-22 ENCOUNTER — Encounter
Admission: EM | Disposition: A | Discharge status: Home / Self Care | Payer: Self-pay | Source: Home / Self Care | Attending: Internal Medicine

## 2022-03-22 DIAGNOSIS — I69351 Hemiplegia and hemiparesis following cerebral infarction affecting right dominant side: Secondary | ICD-10-CM

## 2022-03-22 DIAGNOSIS — K5669 Other partial intestinal obstruction: Secondary | ICD-10-CM | POA: Diagnosis not present

## 2022-03-22 DIAGNOSIS — K56609 Unspecified intestinal obstruction, unspecified as to partial versus complete obstruction: Secondary | ICD-10-CM | POA: Diagnosis not present

## 2022-03-22 DIAGNOSIS — R1084 Generalized abdominal pain: Secondary | ICD-10-CM | POA: Diagnosis not present

## 2022-03-22 DIAGNOSIS — R569 Unspecified convulsions: Secondary | ICD-10-CM | POA: Diagnosis not present

## 2022-03-22 DIAGNOSIS — N184 Chronic kidney disease, stage 4 (severe): Secondary | ICD-10-CM | POA: Diagnosis not present

## 2022-03-22 HISTORY — PX: LAPAROTOMY: SHX154

## 2022-03-22 LAB — BASIC METABOLIC PANEL
Anion gap: 7 (ref 5–15)
BUN: 26 mg/dL — ABNORMAL HIGH (ref 8–23)
CO2: 18 mmol/L — ABNORMAL LOW (ref 22–32)
Calcium: 8.8 mg/dL — ABNORMAL LOW (ref 8.9–10.3)
Chloride: 109 mmol/L (ref 98–111)
Creatinine, Ser: 2.39 mg/dL — ABNORMAL HIGH (ref 0.44–1.00)
GFR, Estimated: 21 mL/min — ABNORMAL LOW (ref >60)
Glucose, Bld: 95 mg/dL (ref 70–99)
Potassium: 3.4 mmol/L — ABNORMAL LOW (ref 3.5–5.1)
Sodium: 134 mmol/L — ABNORMAL LOW (ref 135–145)

## 2022-03-22 LAB — CBC
HCT: 36.7 % (ref 36.0–46.0)
Hemoglobin: 11.8 g/dL — ABNORMAL LOW (ref 12.0–15.0)
MCH: 32.2 pg (ref 26.0–34.0)
MCHC: 32.2 g/dL (ref 30.0–36.0)
MCV: 100 fL (ref 80.0–100.0)
Platelets: 236 10*3/uL (ref 150–400)
RBC: 3.67 MIL/uL — ABNORMAL LOW (ref 3.87–5.11)
RDW: 12.1 % (ref 11.5–15.5)
WBC: 6.4 10*3/uL (ref 4.0–10.5)
nRBC: 0 % (ref 0.0–0.2)

## 2022-03-22 LAB — GLUCOSE, CAPILLARY: Glucose-Capillary: 107 mg/dL — ABNORMAL HIGH (ref 70–99)

## 2022-03-22 SURGERY — LAPAROTOMY, EXPLORATORY
Anesthesia: General | Site: Abdomen

## 2022-03-22 MED ORDER — CEFAZOLIN SODIUM 1 G IJ SOLR
INTRAMUSCULAR | Status: AC
  Filled 2022-03-22: qty 20

## 2022-03-22 MED ORDER — SODIUM CHLORIDE FLUSH 0.9 % IV SOLN
INTRAVENOUS | Status: AC
  Filled 2022-03-22: qty 50

## 2022-03-22 MED ORDER — SUGAMMADEX SODIUM 200 MG/2ML IV SOLN
INTRAVENOUS | Status: DC | PRN
  Administered 2022-03-22: 200 mg via INTRAVENOUS

## 2022-03-22 MED ORDER — SODIUM CHLORIDE 0.9% FLUSH: 10.0000 mL | INTRAVENOUS | Status: DC | PRN

## 2022-03-22 MED ORDER — CEFAZOLIN SODIUM-DEXTROSE 2-4 GM/100ML-% IV SOLN
INTRAVENOUS | Status: AC
  Filled 2022-03-22: qty 100

## 2022-03-22 MED ORDER — PROPOFOL 10 MG/ML IV BOLUS
INTRAVENOUS | Status: AC
  Filled 2022-03-22: qty 20

## 2022-03-22 MED ORDER — SUCCINYLCHOLINE CHLORIDE 200 MG/10ML IV SOSY
PREFILLED_SYRINGE | INTRAVENOUS | Status: DC | PRN
  Administered 2022-03-22: 100 mg via INTRAVENOUS

## 2022-03-22 MED ORDER — OXYCODONE HCL 5 MG PO TABS: 5.0000 mg | ORAL_TABLET | Freq: Once | ORAL | Status: AC

## 2022-03-22 MED ORDER — PROPOFOL 10 MG/ML IV BOLUS
INTRAVENOUS | Status: DC | PRN
  Administered 2022-03-22: 100 mg via INTRAVENOUS

## 2022-03-22 MED ORDER — LIDOCAINE HCL (CARDIAC) PF 100 MG/5ML IV SOSY
PREFILLED_SYRINGE | INTRAVENOUS | Status: DC | PRN
  Administered 2022-03-22: 50 mg via INTRAVENOUS

## 2022-03-22 MED ORDER — PHENYLEPHRINE 80 MCG/ML (10ML) SYRINGE FOR IV PUSH (FOR BLOOD PRESSURE SUPPORT)
PREFILLED_SYRINGE | INTRAVENOUS | Status: AC
  Filled 2022-03-22: qty 10

## 2022-03-22 MED ORDER — ALBUMIN HUMAN 5 % IV SOLN: INTRAVENOUS | Status: DC | PRN

## 2022-03-22 MED ORDER — SODIUM CHLORIDE (PF) 0.9 % IJ SOLN
INTRAMUSCULAR | Status: DC | PRN
  Administered 2022-03-22: 100 mL via INTRAMUSCULAR

## 2022-03-22 MED ORDER — SODIUM CHLORIDE 0.9% FLUSH
10.0000 mL | Freq: Two times a day (BID) | INTRAVENOUS | Status: DC
  Administered 2022-03-23 (×2): 10 mL
  Administered 2022-03-24: 20 mL
  Administered 2022-03-24 – 2022-04-05 (×21): 10 mL

## 2022-03-22 MED ORDER — DEXAMETHASONE SODIUM PHOSPHATE 10 MG/ML IJ SOLN
INTRAMUSCULAR | Status: AC
  Filled 2022-03-22: qty 1

## 2022-03-22 MED ORDER — PHENYLEPHRINE HCL (PRESSORS) 10 MG/ML IV SOLN
INTRAVENOUS | Status: DC | PRN
  Administered 2022-03-22 (×5): 160 ug via INTRAVENOUS

## 2022-03-22 MED ORDER — ROCURONIUM BROMIDE 100 MG/10ML IV SOLN
INTRAVENOUS | Status: DC | PRN
  Administered 2022-03-22: 50 mg via INTRAVENOUS

## 2022-03-22 MED ORDER — ONDANSETRON HCL 4 MG/2ML IJ SOLN
INTRAMUSCULAR | Status: DC | PRN
  Administered 2022-03-22: 4 mg via INTRAVENOUS

## 2022-03-22 MED ORDER — GLYCOPYRROLATE 0.2 MG/ML IJ SOLN
INTRAMUSCULAR | Status: DC | PRN
  Administered 2022-03-22: .2 mg via INTRAVENOUS

## 2022-03-22 MED ORDER — SODIUM CHLORIDE 0.9 % IV SOLN: Freq: Once | INTRAVENOUS | Status: AC

## 2022-03-22 MED ORDER — ACETAMINOPHEN 10 MG/ML IV SOLN
INTRAVENOUS | Status: DC | PRN
  Administered 2022-03-22: 1000 mg via INTRAVENOUS

## 2022-03-22 MED ORDER — EPHEDRINE 5 MG/ML INJ
INTRAVENOUS | Status: AC
  Filled 2022-03-22: qty 5

## 2022-03-22 MED ORDER — OXYCODONE HCL 5 MG PO TABS
ORAL_TABLET | ORAL | Status: AC
  Administered 2022-03-22: 5 mg
  Filled 2022-03-22: qty 1

## 2022-03-22 MED ORDER — DEXAMETHASONE SODIUM PHOSPHATE 10 MG/ML IJ SOLN
INTRAMUSCULAR | Status: DC | PRN
  Administered 2022-03-22: 10 mg via INTRAVENOUS

## 2022-03-22 MED ORDER — EPHEDRINE SULFATE (PRESSORS) 50 MG/ML IJ SOLN
INTRAMUSCULAR | Status: DC | PRN
  Administered 2022-03-22 (×2): 10 mg via INTRAVENOUS
  Administered 2022-03-22: 5 mg via INTRAVENOUS

## 2022-03-22 MED ORDER — FENTANYL CITRATE (PF) 100 MCG/2ML IJ SOLN
INTRAMUSCULAR | Status: DC | PRN
  Administered 2022-03-22 (×2): 50 ug via INTRAVENOUS

## 2022-03-22 MED ORDER — ROCURONIUM BROMIDE 10 MG/ML (PF) SYRINGE
PREFILLED_SYRINGE | INTRAVENOUS | Status: AC
  Filled 2022-03-22: qty 10

## 2022-03-22 MED ORDER — 0.9 % SODIUM CHLORIDE (POUR BTL) OPTIME
TOPICAL | Status: DC | PRN
  Administered 2022-03-22: 500 mL
  Administered 2022-03-22: 1000 mL

## 2022-03-22 MED ORDER — FENTANYL CITRATE (PF) 100 MCG/2ML IJ SOLN
INTRAMUSCULAR | Status: AC
  Filled 2022-03-22: qty 2

## 2022-03-22 MED ORDER — GLYCOPYRROLATE 0.2 MG/ML IJ SOLN
INTRAMUSCULAR | Status: AC
  Filled 2022-03-22: qty 1

## 2022-03-22 MED ORDER — CEFAZOLIN SODIUM-DEXTROSE 2-4 GM/100ML-% IV SOLN
2.0000 g | INTRAVENOUS | Status: AC
  Administered 2022-03-22: 2 g via INTRAVENOUS

## 2022-03-22 MED ORDER — SUCCINYLCHOLINE CHLORIDE 200 MG/10ML IV SOSY
PREFILLED_SYRINGE | INTRAVENOUS | Status: AC
  Filled 2022-03-22: qty 10

## 2022-03-22 MED ORDER — LIDOCAINE HCL (PF) 2 % IJ SOLN
INTRAMUSCULAR | Status: AC
  Filled 2022-03-22: qty 5

## 2022-03-22 MED ORDER — SODIUM CHLORIDE 0.9 % IV SOLN: INTRAVENOUS | Status: DC | PRN

## 2022-03-22 MED ORDER — ONDANSETRON HCL 4 MG/2ML IJ SOLN
INTRAMUSCULAR | Status: AC
  Filled 2022-03-22: qty 2

## 2022-03-22 SURGICAL SUPPLY — 43 items
APL PRP STRL LF DISP 70% ISPRP (MISCELLANEOUS) ×1
CHLORAPREP W/TINT 26 (MISCELLANEOUS) ×1 IMPLANT
DRAPE LAPAROTOMY 100X77 ABD (DRAPES) ×1 IMPLANT
DRSG OPSITE POSTOP 4X10 (GAUZE/BANDAGES/DRESSINGS) ×1 IMPLANT
DRSG OPSITE POSTOP 4X8 (GAUZE/BANDAGES/DRESSINGS) ×1 IMPLANT
ELECT BLADE 6.5 EXT (BLADE) IMPLANT
ELECT CAUTERY BLADE 6.4 (BLADE) ×1 IMPLANT
ELECT REM PT RETURN 9FT ADLT (ELECTROSURGICAL) ×1
ELECTRODE REM PT RTRN 9FT ADLT (ELECTROSURGICAL) ×1 IMPLANT
GAUZE 4X4 16PLY ~~LOC~~+RFID DBL (SPONGE) ×1 IMPLANT
GLOVE BIOGEL PI IND STRL 7.0 (GLOVE) ×1 IMPLANT
GLOVE SURG SYN 6.5 ES PF (GLOVE) ×2 IMPLANT
GLOVE SURG SYN 6.5 PF PI (GLOVE) ×2 IMPLANT
GOWN STRL REUS W/ TWL LRG LVL3 (GOWN DISPOSABLE) ×2 IMPLANT
GOWN STRL REUS W/TWL LRG LVL3 (GOWN DISPOSABLE) ×2
KIT TURNOVER KIT A (KITS) ×1 IMPLANT
LABEL OR SOLS (LABEL) ×1 IMPLANT
MANIFOLD NEPTUNE II (INSTRUMENTS) ×1 IMPLANT
NEEDLE HYPO 22GX1.5 SAFETY (NEEDLE) IMPLANT
NS IRRIG 1000ML POUR BTL (IV SOLUTION) ×1 IMPLANT
NS IRRIG 500ML POUR BTL (IV SOLUTION) IMPLANT
PACK BASIN MAJOR ARMC (MISCELLANEOUS) ×1 IMPLANT
PACK COLON CLEAN CLOSURE (MISCELLANEOUS) ×1 IMPLANT
RELOAD PROXIMATE 75MM BLUE (ENDOMECHANICALS) IMPLANT
RELOAD STAPLE 75 3.8 BLU REG (ENDOMECHANICALS) IMPLANT
SPONGE T-LAP 18X18 ~~LOC~~+RFID (SPONGE) ×4 IMPLANT
STAPLER PROXIMATE 75MM BLUE (STAPLE) IMPLANT
STAPLER SKIN PROX 35W (STAPLE) ×1 IMPLANT
SUT PDS AB 1 CT1 27 (SUTURE) IMPLANT
SUT PDS AB 1 TP1 54 (SUTURE) ×2 IMPLANT
SUT SILK 2 0 (SUTURE) ×1
SUT SILK 2-0 18XBRD TIE 12 (SUTURE) ×1 IMPLANT
SUT SILK 3 0 (SUTURE) ×1
SUT SILK 3-0 (SUTURE) IMPLANT
SUT SILK 3-0 18XBRD TIE 12 (SUTURE) ×1 IMPLANT
SUT VIC AB 3-0 SH 27 (SUTURE) ×1
SUT VIC AB 3-0 SH 27X BRD (SUTURE) ×2 IMPLANT
SUT VICRYL 3-0 CR8 SH (SUTURE) IMPLANT
SYR 20ML LL LF (SYRINGE) IMPLANT
TRAP FLUID SMOKE EVACUATOR (MISCELLANEOUS) ×1 IMPLANT
TRAY FOLEY MTR SLVR 16FR STAT (SET/KITS/TRAYS/PACK) ×1 IMPLANT
TRAY FOLEY SLVR 16FR LF STAT (SET/KITS/TRAYS/PACK) IMPLANT
WATER STERILE IRR 500ML POUR (IV SOLUTION) ×1 IMPLANT

## 2022-03-22 NOTE — Assessment & Plan Note (Signed)
BMI 01.74 Complicates overall prognosis and care Lifestyle modification and exercise has been discussed with patient.

## 2022-03-22 NOTE — Progress Notes (Signed)
PT Cancellation Note  Patient Details Name: Leslie Houston MRN: [id number] DOB: September 29, 1950   Cancelled Treatment:    Reason Eval/Treat Not Completed: Fatigue/lethargy limiting ability to participate  Pt in bed.  Stated she was fatigued from being up earlier, frustration over needing another surgical intervention and awaiting nursing to take some medications.  Requested to rest.  Will return at a later time/date as appropriate.   Chesley Noon 03/22/2022, 2:21 PM

## 2022-03-22 NOTE — Assessment & Plan Note (Signed)
Continue IV Keppra until taking PO. Seizure precautions.

## 2022-03-22 NOTE — Progress Notes (Signed)
Notified by RN patient had another episode of emesis, now she is agreeable to surgery, along with daughter and care taker who are at bedside.  Exploratory laparotomy, possible lysis of adhesions, possible bowel resection. The risk of surgery include, but not limited to, recurrence, bleeding, chronic pain, post-op infxn, post-op SBO or ileus, hernias, resection of bowel, re-anastamosis, and need for re-operation to address said risks. The risks of general anesthetic, if used, includes MI, CVA, sudden death or even reaction to anesthetic medications also discussed. Alternatives include continued observation and NG decompression.  Benefits include possible symptom relief, preventing further decline in health and possible death.  Typical post-op recovery time of additional days in hospital for observation afterwards also discussed.  The patient, daughter, and care taker all verbalized understanding and all questions were answered to the patient's satisfaction.  To OR

## 2022-03-22 NOTE — Progress Notes (Signed)
At 1418 noticed patient's IV infiltrated. About 2 inches edema, erythema and soreness. Ice pack applied and arm elevated on a pillow. Will continue to monitor. Dr Manuella Ghazi has been notified. No new orders at this time. Patient made aware. Patient is calm and understanding.

## 2022-03-22 NOTE — Progress Notes (Signed)
Peripherally Inserted Central Catheter Placement  The IV Nurse has discussed with the patient and/or persons authorized to consent for the patient, the purpose of this procedure and the potential benefits and risks involved with this procedure.  The benefits include less needle sticks, lab draws from the catheter, and the patient may be discharged home with the catheter. Risks include, but not limited to, infection, bleeding, blood clot (thrombus formation), and puncture of an artery; nerve damage and irregular heartbeat and possibility to perform a PICC exchange if needed/ordered by physician.  Alternatives to this procedure were also discussed.  Bard Power PICC patient education guide, fact sheet on infection prevention and patient information card has been provided to patient /or left at bedside.    PICC Placement Documentation        Leslie Houston 03/22/2022, 6:17 PM

## 2022-03-22 NOTE — Assessment & Plan Note (Signed)
History of prior CVA with left-sided hemiparesis Resume aspirin once patient is able to tolerate p.o.

## 2022-03-22 NOTE — Progress Notes (Addendum)
Progress Note   Patient: Leslie Houston ZOX:[id number] DOB: 1950/05/01 DOA: 03/14/2022     8 DOS: the patient was seen and examined on 03/22/2022   Brief hospital course: Leslie Houston is a 71 y.o. 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  She presented to the ED via EMS on 03/14/2022 for evaluation of severe abdominal pain with associated with nausea and emesis.   Patient was found to have partial SBO vs ileus. She was started on empiric IV antibiotics due to concern for UTI.    Urology and General Surgery are consulted.   11/22: CT abdomen/pelvis with p.o. contrast today per surgery 11/23: CT concerning for small bowel obstruction.  Surgeon discussing with family and patient for neck steps    Assessment and Plan: * Small bowel obstruction ([place]) Presented with diffuse abdominal pain, N/V and constipation.  CT scan of abdomen and pelvis showed a few dilated loops of proximal small bowel within the left hemi abdomen gradually taper to the level of the right lower quadrant, where these bowel loops appear to tether to the uterus in the right adnexa.  Findings are suggestive of ileus versus partial small bowel obstruction.  11/18 >> 21 --patient continues to have recurrent obstructive symptoms when resumed on any p.o. intake, refuses to have NG tube replaced --General surgery following -- N.p.o. as of at this morning, diet per surgery --NG tube placed 11/16, removed 11/18 --Serial KUB's --Monitor abdominal exam & bowel function  --PRN antiemetics & pain control --On D5w fluids for hypernatremia  11/22 - patient vomited 300 cc total of yellow/green material this morning.   CT scan showed High-grade mechanical small bowel obstruction, Potential transition point in the mid abdominal central mesentery 11/23 -surgeon discussing with patient/family for neck steps including possible surgery versus NG tube  UTI (urinary tract infection) Versus abnormal UA due to colonization and recent instrumentation.  Started on empiric IV antibiotics on admission.  Urine culture negative. Stop antibiotics and monitor clinically. Follow up pending blood culture (no growth to date).  Hypernatremia Na 147 >> 149 >> 152 >> 154 >> 134, due to NPO status, free water deficit. --Continue D5W  --Resumption of diet per surgery  --Encourage pt to drink water once taking PO --Daily BMP  SIRS (systemic inflammatory response syndrome) (HCC) Presented with tachypnea and leukocytosis in the setting of partial SBO.  There was concern for UTI given recent instrumentation and SP catheter placement, but urine culture was negative.   Sepsis ruled out  Obesity (BMI 30-39.9) BMI 81.19 Complicates overall prognosis and care Lifestyle modification and exercise has been discussed with patient.  Urinary retention Status post recent suprapubic and ureteral catheterization on 11/13 with Dr. Erlene Quan. Plan was to leave ureteral Foley in for 1 week to allow urethral healing with a patent tract. --Urology is following --Foley removed by Dr. Erlene Quan 11/20 --Suprapubic catheter to remain in place --Outpatient urology follow up as scheduled -Continue Flomax  11/21: Suprapubic catheter leaking around the tube, soaking dressings, gown and linens.  Urology do not think a bowel injury is likely. Per Dr. Erlene Quan, this may be normal and related to bladder spasms.  Most important thing is urine draining through the tube itself.  Seizures ([place]) Continue IV Keppra until taking PO.  Seizure precautions.  CKD (chronic kidney disease) stage 4, GFR 15-29 ml/min (HCC) Renal function appears stable, has improved  since admission.   Monitor renal function closely. Consult nephrology consult if worsening         Subjective: Patient seems frustrated and wanting to leave AMA  Physical Exam: Vitals:   03/21/22 1636 03/21/22 2039 03/22/22 0534 03/22/22 0811  BP: (!) 136/56 134/73 (!) 128/51 (!) 132/56  Pulse: (!) 52 (!) 52 (!) 50 67  Resp: '20 16 16 18  '$ Temp: 97.9 F (36.6 C) 97.7 F (36.5 C) 97.6 F (36.4 C) (!) 97.3 F (36.3 C)  TempSrc: Oral     SpO2: 95% 97% 98% 97%  Weight:      Height:       71 year old female sitting in the bed comfortably without acute distress Lungs clear to auscultation bilaterally Cardiovascular regular rate and rhythm Abdomen soft nontender.  She does have moderate distention and some abdominal tympanis but no rebound or guarding GU: Suprapubic catheter in place Neuro alert and awake, at baseline Psych normal mood and affect Data Reviewed:  CT scan of the abdomen and pelvis showing high-grade mechanical small bowel obstruction with potential transition point in the mid abdominal central mesentery  Family Communication: Daughter updated over phone  Disposition: Status is: Inpatient Remains inpatient appropriate because: Management of SBO  Planned Discharge Destination: Home   DVT prophylaxis-SCDs Time spent: 35 minutes  Author: Max Sane, MD 03/22/2022 10:44 AM  For on call review [email or web address].

## 2022-03-22 NOTE — Assessment & Plan Note (Addendum)
Presented with diffuse abdominal pain, N/V and constipation.  CT scan of abdomen and pelvis showed a few dilated loops of proximal small bowel within the left hemi abdomen gradually taper to the level of the right lower quadrant, where these bowel loops appear to tether to the uterus in the right adnexa.  Findings are suggestive of ileus versus partial small bowel obstruction.  11/18 >> 21 --patient continues to have recurrent obstructive symptoms when resumed on any p.o. intake, refuses to have NG tube replaced --General surgery following -- N.p.o. as of at this morning, diet per surgery --NG tube placed 11/16, removed 11/18 --Serial KUB's --Monitor abdominal exam & bowel function  --PRN antiemetics & pain control --On D5w fluids for hypernatremia  11/22 - patient vomited 300 cc total of yellow/green material this morning.  CT scan showed High-grade mechanical small bowel obstruction, Potential transition point in the mid abdominal central mesentery 11/23 -surgeon discussing with patient/family for neck steps including possible surgery versus NG tube

## 2022-03-22 NOTE — Progress Notes (Signed)
Discussed with patient order for NG placement. Patient said no she did not want another NG tube placed.

## 2022-03-22 NOTE — Assessment & Plan Note (Signed)
Na 147 >> 149 >> 152 >> 154 >> 134, due to NPO status, free water deficit. --Continue D5W  --Resumption of diet per surgery  --Encourage pt to drink water once taking PO --Daily BMP

## 2022-03-22 NOTE — Anesthesia Procedure Notes (Signed)
Procedure Name: Intubation Date/Time: 03/22/2022 7:35 PM  Performed by: Rolla Plate, CRNAPre-anesthesia Checklist: Patient identified, Patient being monitored, Timeout performed, Emergency Drugs available and Suction available Patient Re-evaluated:Patient Re-evaluated prior to induction Oxygen Delivery Method: Circle system utilized Preoxygenation: Pre-oxygenation with 100% oxygen Induction Type: IV induction and Rapid sequence Laryngoscope Size: 3 and McGraph Grade View: Grade I Tube type: Oral Tube size: 7.0 mm Number of attempts: 1 Airway Equipment and Method: Stylet and Video-laryngoscopy Placement Confirmation: ETT inserted through vocal cords under direct vision, positive ETCO2 and breath sounds checked- equal and bilateral Secured at: 21 cm Tube secured with: Tape Dental Injury: Teeth and Oropharynx as per pre-operative assessment

## 2022-03-22 NOTE — Assessment & Plan Note (Signed)
Versus abnormal UA due to colonization and recent instrumentation.  Started on empiric IV antibiotics on admission.  Urine culture negative. Stop antibiotics and monitor clinically. Follow up pending blood culture (no growth to date).

## 2022-03-22 NOTE — Anesthesia Preprocedure Evaluation (Addendum)
Anesthesia Evaluation  Patient identified by MRN, date of birth, ID band Patient awake    Reviewed: Allergy & Precautions, NPO status , Patient's Chart, lab work & pertinent test results  History of Anesthesia Complications (+) PONV and history of anesthetic complications  Airway Mallampati: II  TM Distance: >3 FB Neck ROM: full    Dental  (+) Poor Dentition, Missing, Edentulous Upper   Pulmonary sleep apnea , former smoker   Pulmonary exam normal        Cardiovascular hypertension, On Medications + CAD, + Cardiac Stents, + Peripheral Vascular Disease (carotid stenosis) and +CHF  Normal cardiovascular exam+ dysrhythmias (1st degree AV block, sinus bradycardia with sinus arrhythmia)      Neuro/Psych Seizures -,  PSYCHIATRIC DISORDERS Anxiety Depression     Neuromuscular disease CVA, Residual Symptoms    GI/Hepatic Neg liver ROS, PUD,GERD  Medicated,,  Endo/Other  diabetes    Renal/GU Renal disease  negative genitourinary   Musculoskeletal   Abdominal   Peds  Hematology negative hematology ROS (+)   Anesthesia Other Findings Past Medical History: No date: (HFpEF) heart failure with preserved ejection fraction (HCC)     Comment:  a. Pt reports in 2000 she had CHF, details unclear,               outside hospital; b. 06/2019 Echo: EF 50-55%, no rwma, Gr2              DD,  mildly dil LA, mild MR. 06/22/2014: Acute left PCA stroke (HCC) No date: Anxiety     Comment:  a.) on BZO (alprazolam) PRN No date: CAD (coronary artery disease)     Comment:  a. Pt reports in 2000 she had a stent to a coronary               artery, details unclear, outside hospital. No date: Carotid stenosis     Comment:  a. CT angio head 07/2014 - 50-60% stenoses of prox ICA               bilaterally. No date: CKD (chronic kidney disease), stage IV (HCC) No date: Depression No date: Diabetes mellitus ([place])     Comment:  Denies. No date:  Edentulous     Comment:  No upper teeth 06/13/2018: Family hx of colon cancer     Comment:  Father diagnosed in his 65's No date: Gout 03/21/2021: History of cataract extraction, left No date: HOH (hard of hearing)     Comment:  usually needs to read lips No date: Hyperlipidemia No date: Hypertension No date: Intellectual disability     Comment:  a.) born premature, limited formal education, has never               driven No date: Myalgia due to statin No date: Orthostatic hypotension No date: OSA (obstructive sleep apnea)     Comment:  a.) not on nocturnal PAP therapy; old/nonfuntioning DME               - referred for repeat PSG 01/2022 by neurology 01/24/2018: Osteopenia     Comment:  DEXA Sept 2019 No date: PONV (postoperative nausea and vomiting) No date: PTSD (post-traumatic stress disorder)     Comment:    No date: PUD (peptic ulcer disease) No date: PVC's (premature ventricular contractions) 05/2014: Right sided weakness     Comment:  a.) s/p CVA No date: Seizures ([place]) No date: Sinus bradycardia No date: Syncope No date: Visual disturbance as complication  of stroke (RIGHT eye)  Past Surgical History: No date: BLADDER SURGERY 03/21/2021: CATARACT EXTRACTION W/PHACO; Left     Comment:  Procedure: CATARACT EXTRACTION PHACO AND INTRAOCULAR               LENS PLACEMENT ([place]) left 5.86 00:38.0;  Surgeon:               Birder Robson, MD;  Location: [facility];                Service: Ophthalmology;  Laterality: Left;  Latex No date: CESAREAN SECTION No date: CORONARY ANGIOPLASTY WITH STENT PLACEMENT No date: DENTAL SURGERY     Reproductive/Obstetrics negative OB ROS                             Anesthesia Physical Anesthesia Plan  ASA: 3  Anesthesia Plan: General   Post-op Pain Management: Dilaudid IV and Ofirmev IV (intra-op)*   Induction: Intravenous  PONV Risk Score and Plan: 4 or greater and Propofol infusion and  TIVA  Airway Management Planned: Oral ETT  Additional Equipment:   Intra-op Plan:   Post-operative Plan: Extubation in OR  Informed Consent: I have reviewed the patients History and Physical, chart, labs and discussed the procedure including the risks, benefits and alternatives for the proposed anesthesia with the patient or authorized representative who has indicated his/her understanding and acceptance.     Dental Advisory Given  Plan Discussed with: Anesthesiologist, CRNA and Surgeon  Anesthesia Plan Comments: (Patient consented for risks of anesthesia including but not limited to:  - adverse reactions to medications - damage to eyes, teeth, lips or other oral mucosa - nerve damage due to positioning  - sore throat or hoarseness - Damage to heart, brain, nerves, lungs, other parts of body or loss of life  Patient voiced understanding.)        Anesthesia Quick Evaluation

## 2022-03-22 NOTE — Assessment & Plan Note (Signed)
Status post recent suprapubic and ureteral catheterization on 11/13 with Dr. Erlene Quan. Plan was to leave ureteral Foley in for 1 week to allow urethral healing with a patent tract. --Urology is following --Foley removed by Dr. Erlene Quan 11/20 --Suprapubic catheter to remain in place --Outpatient urology follow up as scheduled -Continue Flomax  11/21: Suprapubic catheter leaking around the tube, soaking dressings, gown and linens.  Urology do not think a bowel injury is likely. Per Dr. Erlene Quan, this may be normal and related to bladder spasms.  Most important thing is urine draining through the tube itself.

## 2022-03-22 NOTE — Assessment & Plan Note (Signed)
Presented with tachypnea and leukocytosis in the setting of partial SBO.  There was concern for UTI given recent instrumentation and SP catheter placement, but urine culture was negative.   Sepsis ruled out

## 2022-03-22 NOTE — Progress Notes (Signed)
Subjective:  CC: Leslie Houston is a 71 y.o. female  Hospital stay day 8,   partial SBO versus ileus  HPI: Another small episode of emesis this a.m.  Patient otherwise denies any pain.  ROS:  General: Denies weight loss, weight gain, fatigue, fevers, chills, and night sweats. Heart: Denies chest pain, palpitations, racing heart, irregular heartbeat, leg pain or swelling, and decreased activity tolerance. Respiratory: Denies breathing difficulty, shortness of breath, wheezing, cough, and sputum. GI: Denies change in appetite, heartburn, nausea, vomiting, constipation, diarrhea, and blood in stool. GU: Denies difficulty urinating, pain with urinating, urgency, frequency, blood in urine.   Objective:   Temp:  [97.3 F (36.3 C)-97.9 F (36.6 C)] 97.3 F (36.3 C) (11/23 0811) Pulse Rate:  [50-67] 67 (11/23 0811) Resp:  [16-20] 18 (11/23 0811) BP: (128-136)/(51-73) 132/56 (11/23 0811) SpO2:  [95 %-98 %] 97 % (11/23 0811)     Height: '5\' 11"'$  (180.3 cm) Weight: 85.3 kg BMI (Calculated): 35.54   Intake/Output this shift:   Intake/Output Summary (Last 24 hours) at 03/22/2022 1406 Last data filed at 03/22/2022 1347 Gross per 24 hour  Intake 2350.43 ml  Output 1400 ml  Net 950.43 ml    Constitutional :  alert, cooperative, appears stated age, and no distress  Respiratory:  clear to auscultation bilaterally  Cardiovascular:  regular rate and rhythm  Gastrointestinal: soft, non-tender; bowel sounds normal; no masses,  no organomegaly.  Distention present  Skin: Cool and moist.   Psychiatric: Normal affect, non-agitated, not confused       LABS:     Latest Ref Rng & Units 03/22/2022    4:14 AM 03/21/2022    3:51 AM 03/20/2022    6:46 AM  CMP  Glucose 70 - 99 mg/dL 95  113  112   BUN 8 - 23 mg/dL 26  30  36   Creatinine 0.44 - 1.00 mg/dL 2.39  2.42  2.40   Sodium 135 - 145 mmol/L 134  139  146   Potassium 3.5 - 5.1 mmol/L 3.4  3.6  4.2   Chloride 98 - 111 mmol/L 109  117  120    CO2 22 - 32 mmol/L '18  19  20   '$ Calcium 8.9 - 10.3 mg/dL 8.8  9.3  9.5       Latest Ref Rng & Units 03/22/2022    4:14 AM 03/16/2022    5:55 AM 03/15/2022    4:09 AM  CBC  WBC 4.0 - 10.5 K/uL 6.4  6.8  9.2   Hemoglobin 12.0 - 15.0 g/dL 11.8  11.2  10.4   Hematocrit 36.0 - 46.0 % 36.7  34.4  32.0   Platelets 150 - 400 K/uL 236  241  184     RADS: CLINICAL DATA:  Bowel obstruction suspected   EXAM: CT ABDOMEN AND PELVIS WITHOUT CONTRAST   TECHNIQUE: Multidetector CT imaging of the abdomen and pelvis was performed following the standard protocol without IV contrast.   RADIATION DOSE REDUCTION: This exam was performed according to the departmental dose-optimization program which includes automated exposure control, adjustment of the mA and/or kV according to patient size and/or use of iterative reconstruction technique.   COMPARISON:  None Available.   FINDINGS: Lower chest: Atelectasis at the RIGHT lung base   Hepatobiliary: No focal hepatic lesion. Normal gallbladder. No biliary duct dilatation. Common bile duct is normal.   Pancreas: Pancreas is normal. No ductal dilatation. No pancreatic inflammation.   Spleen: Normal spleen  Adrenals/urinary tract: Adrenal glands normal. Kidneys are atrophic. Ureters normal. Insert a pubic catheter in the bladder. Bladder collapsed.   Stomach/Bowel: Stomach is distended with oral contrast. The duodenum is fluid filled and distended. The fourth portion the duodenum measures 5.3 cm. There is a fluid level the proximal small bowel. Proximal bowel measures 4.8 cm image 48/2). Transition point in the mid abdominal mesentery from dilated small bowel to collapsed small bowel occurs on image 45/2. distal small bowel is collapsed leading up the terminal ileum. Appendix is normal. Colon rectosigmoid colon normal.   No pneumatosis or portal venous gas.  No perforation or free fluid.   Vascular/Lymphatic: Abdominal aorta is normal  caliber. No periportal or retroperitoneal adenopathy. No pelvic adenopathy.   Reproductive: Uterus and adnexa unremarkable.   Other: No free fluid.   Musculoskeletal: No aggressive osseous lesion.   IMPRESSION: 1. High-grade mechanical small bowel obstruction. Recommend surgical consultation. 2. Potential transition point in the mid abdominal central mesentery. 3. No portal venous gas or pneumatosis. No perforation or free fluid.   These results will be called to the ordering clinician or representative by the Radiologist Assistant, and communication documented in the PACS or [employer].     Electronically Signed   By: Suzy Bouchard M.D.   On: 03/21/2022 14:43 Assessment:   Small bowel obstruction noted on CT scan with no resolution of symptoms despite days of waiting for resolution.  I recommended proceeding with surgical intervention with exploratory laparotomy, lysis of adhesions.  Patient and family at this point did not want to proceed despite understanding no other alternatives besides continued monitoring is available.  I specifically explained to them the purpose of the surgery and the likelihood of success, in addition to evaluation to determine cause of persistent bowel obstruction.  Patient and family needed several repeat explanation of the risks benefits alternatives as well as the purpose of the surgery, seeming to be focused on trying to figure out the cause of her symptoms.  I explained multiple times that the surgery will be diagnostic as well as therapeutic.  Despite the multiple explanations on couple different occasions, patient and family members still do not want to proceed with any surgical intervention.  Patient also now considering leaving [place], which I explained to her will likely result in dehydration secondary to intolerance to food or liquid long-term.  Patient agreeable to stay for now but still would like to discuss upcoming steps  with family members.  I specifically explained to them that her suprapubic catheter procedure overall has a low likelihood of causing small bowel obstruction, but due to the timing of the bowel obstruction developing shortly after the procedure, they had serious concerns that it may be related to it.  Again, I emphasized that I cannot explain or determine the exact cause of her bowel obstruction at this time and no further diagnostic testing will be available to determine it at this point.  Will await for further discussion and be available to answer any additional questions or concerns.  Patient still refusing NG tube at this time as well, understanding the risk of continued emesis causing aspiration pneumonia, further decline in health.  Further care per hospitalist in the meantime.  labs/images/medications/previous chart entries reviewed personally and relevant changes/updates noted above.

## 2022-03-22 NOTE — Transfer of Care (Signed)
Immediate Anesthesia Transfer of Care Note  Patient: Leslie Houston  Procedure(s) Performed: EXPLORATORY LAPAROTOMY; LYSIS OF ADHESIONS (Abdomen)  Patient Location: PACU  Anesthesia Type:General  Level of Consciousness: awake and alert   Airway & Oxygen Therapy: Patient Spontanous Breathing and Patient connected to face mask oxygen  Post-op Assessment: Report given to RN and Post -op Vital signs reviewed and stable  Post vital signs: Reviewed  Last Vitals:  Vitals Value Taken Time  BP 92/68 03/22/22 2108  Temp    Pulse 61 03/22/22 2110  Resp 16 03/22/22 2110  SpO2 96 % 03/22/22 2110  Vitals shown include unvalidated device data.  Last Pain:  Vitals:   03/22/22 0845  TempSrc:   PainSc: 0-No pain      Patients Stated Pain Goal: 0 (37/44/51 4604)  Complications: No notable events documented.

## 2022-03-22 NOTE — Assessment & Plan Note (Signed)
Renal function appears stable, has improved since admission.   Monitor renal function closely. Consult nephrology consult if worsening

## 2022-03-23 ENCOUNTER — Inpatient Hospital Stay: Payer: Self-pay

## 2022-03-23 ENCOUNTER — Inpatient Hospital Stay: Payer: Medicare Other

## 2022-03-23 DIAGNOSIS — K56609 Unspecified intestinal obstruction, unspecified as to partial versus complete obstruction: Secondary | ICD-10-CM | POA: Diagnosis not present

## 2022-03-23 DIAGNOSIS — R1084 Generalized abdominal pain: Secondary | ICD-10-CM | POA: Diagnosis not present

## 2022-03-23 DIAGNOSIS — R339 Retention of urine, unspecified: Secondary | ICD-10-CM | POA: Diagnosis not present

## 2022-03-23 DIAGNOSIS — K5669 Other partial intestinal obstruction: Secondary | ICD-10-CM | POA: Diagnosis not present

## 2022-03-23 DIAGNOSIS — T83510D Infection and inflammatory reaction due to cystostomy catheter, subsequent encounter: Secondary | ICD-10-CM | POA: Diagnosis not present

## 2022-03-23 DIAGNOSIS — E870 Hyperosmolality and hypernatremia: Secondary | ICD-10-CM | POA: Diagnosis not present

## 2022-03-23 MED ORDER — DEXTROSE-NACL 5-0.9 % IV SOLN: INTRAVENOUS | Status: DC

## 2022-03-23 MED ORDER — SODIUM CHLORIDE 0.9 % IV SOLN: Freq: Once | INTRAVENOUS | Status: DC

## 2022-03-23 NOTE — Op Note (Addendum)
Pre-Op Dx: Small bowel obstruction Post-Op Dx: Same  anesthesia: GETA EBL: 15 Complications:  none apparent Specimen: Small bowel mesentery Procedure: Exploratory laparotomy, lysis of adhesions Surgeon: Lysle Pearl  Indications for procedure: See H&P  Description of Procedure:  Consent obtained, time out performed.  Patient placed in supine position.  Foley placed area sterilized and draped in usual position.  Periumbilical midline incision made and dissection carried down to fascia.  Fascia then incised and peritoneum entered.  Dilated small bowel noted along with completely decompressed bowel.  While attempting to find the transition point, a small tug was felt while running the bowel and then a sudden release of the bowel.  Further examination noted a transition point within the area of some mesenteric thickening.  This area was biopsied and passed off pending pathology.  The bowel was then completely examined from the terminal ileum to the ligament of Treitz twice for additional pathology.  No additional pathology was noted.  Dilated bowel remained viable throughout the entire portion of this procedure.    Further examination of the abdominal wall noted some omental adhesions near the cecum which were lysed with Metzenbaum scissors.  In the area where the transition was noted, the suprapubic catheter recently placed was visible within the peritoneal cavity coming through the abdominal wall and then entering back into redundant peritoneal lining heading towards the bladder.  The balloon was palpable and still intact.  Due to the concern that the intraperitoneal portion of the catheter may have been the source of the adhesion, 3-0 Vicryl was used to approximate the redundant peritoneum on the bladder side to the peritoneum on the abdominal wall side to cover the visible catheter and prevent further issues.  Care was noted to make sure the catheter was not injured and the bladder was not injured during  approximation of the peritoneal layer.    Abdomen irrigated, and then the fascia was closed with 1 PDS x 2.  Skin approximated using interrupted 3-0 Vicryl.  Skin then closed with staples.  Wound then dressed with honeycomb dressing.  Pt tolerated procedure well, and transferred to PACU in stable condition, Foley and NG in place.  Sponge and instrument count correct at end of procedure.

## 2022-03-23 NOTE — Progress Notes (Signed)
Progress Note   Patient: Leslie Houston AST:[id number] DOB: 11/07/1950 DOA: 03/14/2022     9 DOS: the patient was seen and examined on 03/23/2022   Brief hospital course: Leslie Houston is a 71 y.o. 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  She presented to the ED via EMS on 03/14/2022 for evaluation of severe abdominal pain with associated with nausea and emesis.   Patient was found to have partial SBO vs ileus. She was started on empiric IV antibiotics due to concern for UTI.    Urology and General Surgery are consulted.   11/22: CT abdomen/pelvis with p.o. contrast today per surgery 11/23: CT - small bowel obstruction.  Exploratory laparotomy and lysis of additions 11/24: Postop day 1, attending service changed to surgery.    Assessment and Plan: * Small bowel obstruction ([place]) Presented with diffuse abdominal pain, N/V and constipation.  CT scan of abdomen and pelvis showed a few dilated loops of proximal small bowel within the left hemi abdomen gradually taper to the level of the right lower quadrant, where these bowel loops appear to tether to the uterus in the right adnexa.  Findings are suggestive of ileus versus partial small bowel obstruction.  11/18 >> 21 --patient continues to have recurrent obstructive symptoms when resumed on any p.o. intake, refuses to have NG tube replaced --General surgery following -- N.p.o. as of at this morning, diet per surgery --NG tube placed 11/16, removed 11/18 --Serial KUB's --Monitor abdominal exam & bowel function  --PRN antiemetics & pain control --On D5w fluids for hypernatremia  11/22 - patient vomited 300 cc total of  yellow/green material this morning.  CT scan showed High-grade mechanical small bowel obstruction, Potential transition point in the mid abdominal central mesentery 11/23 -underwent exploratory laparotomy lysis of adhesions by Dr. Lysle Pearl  UTI (urinary tract infection) Versus abnormal UA due to colonization and recent instrumentation.  Started on empiric IV antibiotics on admission.  Urine culture negative. Stop antibiotics and monitor clinically. Follow up pending blood culture (no growth to date).  Hypernatremia Na 147 >> 149 >> 152 >> 154 >> 134, due to NPO status, free water deficit. --Continue D5W  --Resumption of diet per surgery  --Encourage pt to drink water once taking PO --Daily BMP  Obesity (BMI 30-39.9) BMI 98.92 Complicates overall prognosis and care Lifestyle modification and exercise has been discussed with patient.  Urinary retention Status post recent suprapubic and ureteral catheterization on 11/13 with Dr. Erlene Quan. Plan was to leave ureteral Foley in for 1 week to allow urethral healing with a patent tract. --Urology is following --Foley removed by Dr. Erlene Quan 11/20 --Suprapubic catheter to remain in place --Outpatient urology follow up as scheduled -Continue Flomax  11/21: Suprapubic catheter leaking around the tube, soaking dressings, gown and linens.  Urology do not think a bowel injury is likely. Per Dr. Erlene Quan, this may be normal and related to bladder spasms.  Most important thing is urine draining through the tube itself.  Seizures ([place]) Continue IV Keppra until taking PO. Seizure precautions.  CKD (chronic kidney disease) stage 4, GFR 15-29 ml/min (HCC) Renal function appears stable, has improved since admission.   Monitor renal function closely. Consult nephrology consult if worsening  Partial intestinal obstruction (HCC)-resolved as of 03/22/2022  See partial SBO and surgery's recommendations  Hemiparesis affecting right side as late effect of  cerebrovascular accident (CVA) (HCC)-resolved as of 03/22/2022 History of prior CVA with left-sided hemiparesis Resume aspirin once patient is able to tolerate p.o.        Subjective: POD 1, feeling better.  Reports postop soreness in the abdomen  Physical Exam: Vitals:   03/22/22 2115 03/22/22 2212 03/23/22 0438 03/23/22 0736  BP: (!) 82/47 113/71 114/63 (!) 132/55  Pulse: (!) 57 (!) 56 (!) 52 (!) 49  Resp: '14 18 20 17  '$ Temp:  99.1 F (37.3 C) 97.7 F (36.5 C) 97.6 F (36.4 C)  TempSrc:   Oral   SpO2: 97% 99% 98% 99%  Weight:      Height:       71 year old female sitting in the bed comfortably without acute distress NG tube in place Lungs clear to auscultation bilaterally Cardiovascular regular rate and rhythm Abdomen soft, honeycomb dressing in place GU: Suprapubic catheter in place Neuro alert and awake, at baseline Psych normal mood and affect Data Reviewed:  There are no new results to review at this time.  Family Communication: None  Disposition: Status is: Inpatient Remains inpatient appropriate because: Postop recovery.  Surgery team is the primary now.  Dr. Christian Mate in agreement  Planned Discharge Destination: Home   DVT prophylaxis-SCDs Time spent: 35 minutes  Author: Max Sane, MD 03/23/2022 12:37 PM  For on call review [email or web address].

## 2022-03-23 NOTE — Assessment & Plan Note (Signed)
Na 147 >> 149 >> 152 >> 154 >> 134, due to NPO status, free water deficit. --Continue D5W  --Resumption of diet per surgery  --Encourage pt to drink water once taking PO --Daily BMP

## 2022-03-23 NOTE — Anesthesia Postprocedure Evaluation (Signed)
Anesthesia Post Note  Patient: Leslie Houston  Procedure(s) Performed: EXPLORATORY LAPAROTOMY; LYSIS OF ADHESIONS (Abdomen)  Patient location during evaluation: PACU Anesthesia Type: General Level of consciousness: awake and alert Pain management: pain level controlled Vital Signs Assessment: post-procedure vital signs reviewed and stable Respiratory status: spontaneous breathing, nonlabored ventilation, respiratory function stable and patient connected to nasal cannula oxygen Cardiovascular status: blood pressure returned to baseline and stable Postop Assessment: no apparent nausea or vomiting Anesthetic complications: no   No notable events documented.   Last Vitals:  Vitals:   03/22/22 2212 03/23/22 0438  BP: 113/71 114/63  Pulse: (!) 56 (!) 52  Resp: 18 20  Temp: 37.3 C 36.5 C  SpO2: 99% 98%    Last Pain:  Vitals:   03/23/22 0438  TempSrc: Oral  PainSc:                  [name]

## 2022-03-23 NOTE — Assessment & Plan Note (Signed)
Continue IV Keppra until taking PO. Seizure precautions.

## 2022-03-23 NOTE — Assessment & Plan Note (Signed)
Presented with diffuse abdominal pain, N/V and constipation.  CT scan of abdomen and pelvis showed a few dilated loops of proximal small bowel within the left hemi abdomen gradually taper to the level of the right lower quadrant, where these bowel loops appear to tether to the uterus in the right adnexa.  Findings are suggestive of ileus versus partial small bowel obstruction.  11/18 >> 21 --patient continues to have recurrent obstructive symptoms when resumed on any p.o. intake, refuses to have NG tube replaced --General surgery following -- N.p.o. as of at this morning, diet per surgery --NG tube placed 11/16, removed 11/18 --Serial KUB's --Monitor abdominal exam & bowel function  --PRN antiemetics & pain control --On D5w fluids for hypernatremia  11/22 - patient vomited 300 cc total of yellow/green material this morning.  CT scan showed High-grade mechanical small bowel obstruction, Potential transition point in the mid abdominal central mesentery 11/23 -underwent exploratory laparotomy lysis of adhesions by Dr. Lysle Pearl

## 2022-03-23 NOTE — Assessment & Plan Note (Signed)
Renal function appears stable, has improved since admission.   Monitor renal function closely. Consult nephrology consult if worsening

## 2022-03-23 NOTE — Care Management Important Message (Signed)
Important Message  Patient Details  Name: Leslie Houston MRN: [id number] Date of Birth: 25-Apr-1951   Medicare Important Message Given:  Yes     Dannette Barbara 03/23/2022, 11:09 AM

## 2022-03-23 NOTE — Assessment & Plan Note (Signed)
Status post recent suprapubic and ureteral catheterization on 11/13 with Dr. Erlene Quan. Plan was to leave ureteral Foley in for 1 week to allow urethral healing with a patent tract. --Urology is following --Foley removed by Dr. Erlene Quan 11/20 --Suprapubic catheter to remain in place --Outpatient urology follow up as scheduled -Continue Flomax  11/21: Suprapubic catheter leaking around the tube, soaking dressings, gown and linens.  Urology do not think a bowel injury is likely. Per Dr. Erlene Quan, this may be normal and related to bladder spasms.  Most important thing is urine draining through the tube itself.

## 2022-03-23 NOTE — Progress Notes (Signed)
PT Cancellation Note  Patient Details Name: Leslie Houston MRN: [id number] DOB: 06/03/1950   Cancelled Treatment:    Reason Eval/Treat Not Completed: Patient declined, no reason specified Patient complaining of discomfort at NGT site and reports "feels like it is coming out". Also reporting pain at incision site. Requesting to defer PT this date due to current complaints. Notified RN. PT will re-attempt at later date/time as appropriate.   Arjay Jaskiewicz A. Gilford Rile PT, DPT [place] [facility] 03/23/2022, 2:02 PM

## 2022-03-23 NOTE — Assessment & Plan Note (Signed)
BMI 13.64 Complicates overall prognosis and care Lifestyle modification and exercise has been discussed with patient.

## 2022-03-23 NOTE — Progress Notes (Signed)
[facility] Hospital Day(s): 9.   Post op day(s): 1 Day Post-Op.   Interval History: Patient seen and examined, wants NG tube removed, no acute events or new complaints overnight. Patient reports some flatus.  Unaware of greater than 1 L of postoperative NG output.  Review of Systems:  Constitutional: denies fever, chills  Respiratory: denies any shortness of breath  Cardiovascular: denies chest pain or palpitations  Gastrointestinal: denies abdominal pain, aside from her incision.   Musculoskeletal: denies pain, decreased motor or sensation Integumentary: denies any other rashes or skin discolorations  Vital signs in last 24 hours: [min-max] current  Temp:  [97.6 F (36.4 C)-99.1 F (37.3 C)] 97.6 F (36.4 C) (11/24 0736) Pulse Rate:  [49-60] 49 (11/24 0736) Resp:  [14-20] 17 (11/24 0736) BP: (82-156)/(47-82) 132/55 (11/24 0736) SpO2:  [96 %-99 %] 99 % (11/24 0736)     Height: '5\' 11"'$  (180.3 cm) Weight: 85.3 kg BMI (Calculated): 35.54   Intake/Output last 2 shifts:  11/23 0701 - 11/24 0700 In: 1759.3 [I.V.:1309.3; IV Piggyback:450] Out: 2370 [Urine:1250; Blood:20]  (NG output 1139m)  Physical Exam:  Constitutional: alert, cooperative and no distress  Respiratory: breathing non-labored at rest  Cardiovascular: regular rate and sinus rhythm  Gastrointestinal: Peri-incisional tenderness, otherwise soft, non-tender, and yet distended Integumentary: Intact.  Incision clean dry and intact.  With honeycomb dressing.  Labs:     Latest Ref Rng & Units 03/22/2022    4:14 AM 03/16/2022    5:55 AM 03/15/2022    4:09 AM  CBC  WBC 4.0 - 10.5 K/uL 6.4  6.8  9.2   Hemoglobin 12.0 - 15.0 g/dL 11.8  11.2  10.4   Hematocrit 36.0 - 46.0 % 36.7  34.4  32.0   Platelets 150 - 400 K/uL 236  241  184       Latest Ref Rng & Units 03/22/2022    4:14 AM 03/21/2022    3:51 AM 03/20/2022    6:46 AM  CMP  Glucose 70 - 99 mg/dL 95  113  112   BUN 8  - 23 mg/dL 26  30  36   Creatinine 0.44 - 1.00 mg/dL 2.39  2.42  2.40   Sodium 135 - 145 mmol/L 134  139  146   Potassium 3.5 - 5.1 mmol/L 3.4  3.6  4.2   Chloride 98 - 111 mmol/L 109  117  120   CO2 22 - 32 mmol/L '18  19  20   '$ Calcium 8.9 - 10.3 mg/dL 8.8  9.3  9.5      Imaging studies: No new pertinent imaging studies   Assessment/Plan:  71y.o. female with  1 Day Post-Op s/p lysis of adhesions and biopsy for small bowel obstruction, complicated by pertinent comorbidities including .  Patient Active Problem List   Diagnosis Date Noted   Hypernatremia 03/16/2022   Small bowel obstruction ([facility] 03/14/2022   Obesity (BMI 30-39.9) 03/14/2022   Cerebrovascular accident (CVA) ([facility] 02/15/2022   Gait abnormality 02/15/2022   Urinary retention 02/15/2022   OSA (obstructive sleep apnea) 02/15/2022   History of CVA (cerebrovascular accident) 08/24/2021   Moderate episode of recurrent major depressive disorder ([facility] 06/28/2021   Statin myopathy 03/09/2020   Lymphedema 03/09/2020   Partial symptomatic epilepsy with complex partial seizures, not intractable, without status epilepticus ([facility] 02/18/2020   Bad dreams 02/18/2020   Chronic post-traumatic stress disorder (PTSD) 02/18/2020   Snoring 02/18/2020   Coronary artery disease  02/18/2020   Stroke ([place])    Debility 07/07/2019   Hyperkalemia 07/03/2019   AKI (acute kidney injury) ([place]) 07/02/2019   Anemia of chronic disease 07/02/2019   Bradycardia 07/02/2019   Acute CHF (congestive heart failure) ([place]) 07/02/2019   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  -Will continue NG tube for another day, pending improved evidence of return of bowel function.  -Continue IV fluids.  -Repeat labs in AM.  All of the above findings and recommendations were discussed with the patient, and all of patient's questions were answered to their expressed satisfaction.  -- Ronny Bacon, M.D., [facility] 03/23/2022

## 2022-03-23 NOTE — Assessment & Plan Note (Signed)
Versus abnormal UA due to colonization and recent instrumentation.  Started on empiric IV antibiotics on admission.  Urine culture negative. Stop antibiotics and monitor clinically. Follow up pending blood culture (no growth to date).

## 2022-03-24 ENCOUNTER — Encounter: Payer: Self-pay | Admitting: Surgery

## 2022-03-24 DIAGNOSIS — N184 Chronic kidney disease, stage 4 (severe): Secondary | ICD-10-CM | POA: Diagnosis not present

## 2022-03-24 DIAGNOSIS — T83510D Infection and inflammatory reaction due to cystostomy catheter, subsequent encounter: Secondary | ICD-10-CM | POA: Diagnosis not present

## 2022-03-24 DIAGNOSIS — R1084 Generalized abdominal pain: Secondary | ICD-10-CM | POA: Diagnosis not present

## 2022-03-24 DIAGNOSIS — K5669 Other partial intestinal obstruction: Secondary | ICD-10-CM | POA: Diagnosis not present

## 2022-03-24 DIAGNOSIS — K56609 Unspecified intestinal obstruction, unspecified as to partial versus complete obstruction: Secondary | ICD-10-CM | POA: Diagnosis not present

## 2022-03-24 DIAGNOSIS — E870 Hyperosmolality and hypernatremia: Secondary | ICD-10-CM | POA: Diagnosis not present

## 2022-03-24 LAB — BASIC METABOLIC PANEL
Anion gap: 5 (ref 5–15)
BUN: 29 mg/dL — ABNORMAL HIGH (ref 8–23)
CO2: 16 mmol/L — ABNORMAL LOW (ref 22–32)
Calcium: 8.5 mg/dL — ABNORMAL LOW (ref 8.9–10.3)
Chloride: 119 mmol/L — ABNORMAL HIGH (ref 98–111)
Creatinine, Ser: 2.59 mg/dL — ABNORMAL HIGH (ref 0.44–1.00)
GFR, Estimated: 19 mL/min — ABNORMAL LOW (ref >60)
Glucose, Bld: 109 mg/dL — ABNORMAL HIGH (ref 70–99)
Potassium: 3.6 mmol/L (ref 3.5–5.1)
Sodium: 140 mmol/L (ref 135–145)

## 2022-03-24 LAB — CBC
HCT: 33.3 % — ABNORMAL LOW (ref 36.0–46.0)
Hemoglobin: 10.9 g/dL — ABNORMAL LOW (ref 12.0–15.0)
MCH: 32.3 pg (ref 26.0–34.0)
MCHC: 32.7 g/dL (ref 30.0–36.0)
MCV: 98.8 fL (ref 80.0–100.0)
Platelets: 221 10*3/uL (ref 150–400)
RBC: 3.37 MIL/uL — ABNORMAL LOW (ref 3.87–5.11)
RDW: 12.5 % (ref 11.5–15.5)
WBC: 8.9 10*3/uL (ref 4.0–10.5)
nRBC: 0 % (ref 0.0–0.2)

## 2022-03-24 NOTE — Progress Notes (Signed)
Progress Note   Patient: Leslie Houston [id number]:[id number] DOB: 1950-11-15 DOA: 03/14/2022     10 DOS: the patient was seen and examined on 03/24/2022   Brief hospital course: Leslie Houston is a 71 y.o. 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  She presented to the ED via EMS on 03/14/2022 for evaluation of severe abdominal pain with associated with nausea and emesis.   Patient was found to have partial SBO vs ileus. She was started on empiric IV antibiotics due to concern for UTI.    Urology and General Surgery are consulted.   11/22: CT abdomen/pelvis with p.o. contrast today per surgery 11/23: CT - small bowel obstruction.  Exploratory laparotomy and lysis of additions 11/24: Postop day 1, attending service changed to surgery. 11/25: NG tube in place    Assessment and Plan: * Small bowel obstruction ([place]) Presented with diffuse abdominal pain, N/V and constipation.  CT scan of abdomen and pelvis showed a few dilated loops of proximal small bowel within the left hemi abdomen gradually taper to the level of the right lower quadrant, where these bowel loops appear to tether to the uterus in the right adnexa.  Findings are suggestive of ileus versus partial small bowel obstruction.  11/18 >> 21 --patient continues to have recurrent obstructive symptoms when resumed on any p.o. intake, refuses to have NG tube replaced 11/22 - patient vomited 300 cc total of yellow/green material this morning.  CT scan showed High-grade mechanical small bowel obstruction, Potential transition point in the mid abdominal central mesentery 11/23 -underwent exploratory laparotomy lysis of adhesions  by Dr. Lysle Pearl 11/24-25: Postop recovery -PRN antiemetics & pain control --On D5w fluids for hypernatremia and low blood sugar while n.p.o.  UTI (urinary tract infection) Versus abnormal UA due to colonization and recent instrumentation.  Started on empiric IV antibiotics on admission.  Urine culture negative. Stop antibiotics and monitor clinically. Follow up pending blood culture (no growth to date).  Hypernatremia Na 147 >> 149 >> 152 >> 154 >> 134, due to NPO status, free water deficit. --Continue D5W  --Resumption of diet per surgery  --Encourage pt to drink water once taking PO --Daily BMP  Obesity (BMI 30-39.9) BMI 83.41 Complicates overall prognosis and care Lifestyle modification and exercise has been discussed with patient.  Urinary retention Status post recent suprapubic and ureteral catheterization on 11/13 with Dr. Erlene Quan. Plan was to leave ureteral Foley in for 1 week to allow urethral healing with a patent tract. --Urology is following --Foley removed by Dr. Erlene Quan 11/20 --Suprapubic catheter to remain in place --Outpatient urology follow up as scheduled -Continue Flomax  11/21: Suprapubic catheter leaking around the tube, soaking dressings, gown and linens.  Urology do not think a bowel injury is likely. Per Dr. Erlene Quan, this may be normal and related to bladder spasms.  Most important thing is urine draining through the tube itself.  Seizures ([place]) Continue IV Keppra until taking PO. Seizure precautions.  CKD (chronic kidney disease) stage 4, GFR 15-29 ml/min (HCC) Renal function appears stable, has improved since admission.   Monitor renal function closely. Consult nephrology consult if worsening  Partial intestinal obstruction (HCC)-resolved as of 03/22/2022 See partial SBO and surgery's recommendations  Hemiparesis affecting right side as late effect of  cerebrovascular accident (CVA) (HCC)-resolved as of 03/22/2022 History of prior CVA with left-sided  hemiparesis Resume aspirin once patient is able to tolerate p.o.        Subjective: No new complaints.  Hoping to get the NG tube out soon  Physical Exam: Vitals:   03/23/22 1724 03/23/22 2007 03/24/22 0536 03/24/22 0742  BP: (!) 135/50 (!) 118/47 (!) 107/46 (!) 106/43  Pulse: (!) 58 65 (!) 56 (!) 58  Resp:  '18 20 18  '$ Temp: 97.7 F (36.5 C) (!) 97.5 F (36.4 C) 98.1 F (36.7 C) 98.2 F (36.8 C)  TempSrc:  Oral Oral   SpO2: 99% 97% 94% 91%  Weight:      Height:       72 year old female sitting in the bed comfortably without acute distress NG tube in place Lungs clear to auscultation bilaterally Cardiovascular regular rate and rhythm Abdomen soft, honeycomb dressing in place GU: Suprapubic catheter in place Neuro alert and awake, at baseline Psych normal mood and affect Data Reviewed:  There are no new results to review at this time.  Family Communication: None  Disposition: Status is: Inpatient Remains inpatient appropriate because: Postop recovery and return of bowel function pending  Planned Discharge Destination: Home with Home Health   DVT prophylaxis-SCDs Time spent: 35 minutes  Author: Max Sane, MD 03/24/2022 9:50 AM  For on call review [email or web address].

## 2022-03-24 NOTE — Assessment & Plan Note (Signed)
Versus abnormal UA due to colonization and recent instrumentation.  Started on empiric IV antibiotics on admission.  Urine culture negative. Stop antibiotics and monitor clinically. Follow up pending blood culture (no growth to date).

## 2022-03-24 NOTE — Progress Notes (Signed)
MD stated to leave NG tube out. Will continue to monitor and notify upcoming shift

## 2022-03-24 NOTE — Assessment & Plan Note (Signed)
BMI 20.60 Complicates overall prognosis and care Lifestyle modification and exercise has been discussed with patient.

## 2022-03-24 NOTE — Assessment & Plan Note (Signed)
Continue IV Keppra until taking PO. Seizure precautions.

## 2022-03-24 NOTE — Progress Notes (Signed)
Pt pulled out NG tube. Reached out to Dr. Christian Mate( Attending)at 1:56 pm and notified him of NG tube. No new orders placed still awaiting response from MD. Edison Simon, PA was added to message by Dr.Rodenberg at 3:07 pm. I called OR in an attempt to get further orders. I was unable to speak with MD but was notified to reach out to Dr. Manuella Ghazi. Dr Manuella Ghazi notified of pt status at 4:45 pm. Still no new orders placed and will continue to wait for response and monitor,

## 2022-03-24 NOTE — Assessment & Plan Note (Signed)
Renal function appears stable, has improved since admission.   Monitor renal function closely. Consult nephrology consult if worsening

## 2022-03-24 NOTE — Progress Notes (Signed)
[place] Hospital Day(s): 10.   Post op day(s): 2 Days Post-Op.   Interval History: Patient seen and examined, as usual, wants NG tube removed, no acute events or new complaints overnight. Patient reports some flatus, as she always does.  No recorded NG tube output today, resumed low wall suction, with some output.  I also obtained a catheter tray and removed the [place] sump filter that was plugged, and injected air through the sump port obtaining additional output.  I left the NG catheter maintenance tray in the room for nursing.  Review of Systems:  Constitutional: denies fever, chills  Respiratory: denies any shortness of breath  Cardiovascular: denies chest pain or palpitations  Gastrointestinal: denies abdominal pain, aside from her incision.   Musculoskeletal: denies pain, decreased motor or sensation Integumentary: denies any other rashes or skin discolorations  Vital signs in last 24 hours: [min-max] current  Temp:  [97.5 F (36.4 C)-98.2 F (36.8 C)] 98.2 F (36.8 C) (11/25 0742) Pulse Rate:  [56-65] 58 (11/25 0742) Resp:  [16-20] 18 (11/25 0742) BP: (106-135)/(43-50) 106/43 (11/25 0742) SpO2:  [91 %-99 %] 91 % (11/25 0742)     Height: '5\' 11"'$  (180.3 cm) Weight: 85.3 kg BMI (Calculated): 35.54   Intake/Output last 2 shifts:  11/24 0701 - 11/25 0700 In: 1238.9 [I.V.:1238.9] Out: 1900 [Urine:1900]    Physical Exam:  Constitutional: alert, cooperative and no distress  Respiratory: breathing non-labored at rest  Cardiovascular: regular rate and sinus rhythm  Gastrointestinal: Peri-incisional tenderness, otherwise soft, non-tender, and yet distended Integumentary: Intact.  Incision clean dry and intact.  With honeycomb dressing.  Labs:     Latest Ref Rng & Units 03/22/2022    4:14 AM 03/16/2022    5:55 AM 03/15/2022    4:09 AM  CBC  WBC 4.0 - 10.5 K/uL 6.4  6.8  9.2   Hemoglobin 12.0 - 15.0 g/dL 11.8  11.2  10.4    Hematocrit 36.0 - 46.0 % 36.7  34.4  32.0   Platelets 150 - 400 K/uL 236  241  184       Latest Ref Rng & Units 03/22/2022    4:14 AM 03/21/2022    3:51 AM 03/20/2022    6:46 AM  CMP  Glucose 70 - 99 mg/dL 95  113  112   BUN 8 - 23 mg/dL 26  30  36   Creatinine 0.44 - 1.00 mg/dL 2.39  2.42  2.40   Sodium 135 - 145 mmol/L 134  139  146   Potassium 3.5 - 5.1 mmol/L 3.4  3.6  4.2   Chloride 98 - 111 mmol/L 109  117  120   CO2 22 - 32 mmol/L '18  19  20   '$ Calcium 8.9 - 10.3 mg/dL 8.8  9.3  9.5      Imaging studies: No new pertinent imaging studies   Assessment/Plan:  71 y.o. female with  2 Days Post-Op s/p lysis of adhesions and biopsy for small bowel obstruction, complicated by pertinent comorbidities including .  Patient Active Problem List   Diagnosis Date Noted   Hypernatremia 03/16/2022   Small bowel obstruction ([place]) 03/14/2022   Obesity (BMI 30-39.9) 03/14/2022   Cerebrovascular accident (CVA) ([place]) 02/15/2022   Gait abnormality 02/15/2022   Urinary retention 02/15/2022   OSA (obstructive sleep apnea) 02/15/2022   History of CVA (cerebrovascular accident) 08/24/2021   Moderate episode of recurrent major depressive disorder ([place]) 06/28/2021   Statin myopathy  03/09/2020   Lymphedema 03/09/2020   Partial symptomatic epilepsy with complex partial seizures, not intractable, without status epilepticus ([place]) 02/18/2020   Bad dreams 02/18/2020   Chronic post-traumatic stress disorder (PTSD) 02/18/2020   Snoring 02/18/2020   Coronary artery disease 02/18/2020   Stroke ([place])    Debility 07/07/2019   Hyperkalemia 07/03/2019   AKI (acute kidney injury) ([place]) 07/02/2019   Anemia of chronic disease 07/02/2019   Bradycardia 07/02/2019   Acute CHF (congestive heart failure) ([place]) 07/02/2019   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   History of diabetes mellitus 01/22/2019   Osteopenia 01/24/2018   Anxiety 12/03/2017   Neutropenia ([place]) 12/31/2015   Seizures (HCC)    Carotid  stenosis    PVC's (premature ventricular contractions)    UTI (urinary tract infection)    CKD (chronic kidney disease) stage 4, GFR 15-29 ml/min ([place]) 08/19/2014   Morbid obesity ([place]) 03/04/2006   Essential hypertension 03/04/2006   GERD 03/04/2006   History of gastric ulcer 03/04/2006    -Follow-up pathology.  -Will continue NG tube for another day, pending improved evidence of return of bowel function.  Will obtain KUB in a.m.  -Continue IV fluids.  -Repeat labs in AM.  All of the above findings and recommendations were discussed with the patient, and all of patient's questions were answered to their expressed satisfaction.  -- Ronny Bacon, M.D., [facility] 03/24/2022

## 2022-03-24 NOTE — Assessment & Plan Note (Addendum)
Presented with diffuse abdominal pain, N/V and constipation.  CT scan of abdomen and pelvis showed a few dilated loops of proximal small bowel within the left hemi abdomen gradually taper to the level of the right lower quadrant, where these bowel loops appear to tether to the uterus in the right adnexa.  Findings are suggestive of ileus versus partial small bowel obstruction.  11/18 >> 21 --patient continues to have recurrent obstructive symptoms when resumed on any p.o. intake, refuses to have NG tube replaced 11/22 - patient vomited 300 cc total of yellow/green material this morning.  CT scan showed High-grade mechanical small bowel obstruction, Potential transition point in the mid abdominal central mesentery 11/23 -underwent exploratory laparotomy lysis of adhesions by Dr. Lysle Pearl 11/24-25: Postop recovery -PRN antiemetics & pain control --On D5w fluids for hypernatremia and low blood sugar while n.p.o.

## 2022-03-24 NOTE — Assessment & Plan Note (Signed)
Na 147 >> 149 >> 152 >> 154 >> 134, due to NPO status, free water deficit. --Continue D5W  --Resumption of diet per surgery  --Encourage pt to drink water once taking PO --Daily BMP

## 2022-03-24 NOTE — Assessment & Plan Note (Signed)
Status post recent suprapubic and ureteral catheterization on 11/13 with Dr. Erlene Quan. Plan was to leave ureteral Foley in for 1 week to allow urethral healing with a patent tract. --Urology is following --Foley removed by Dr. Erlene Quan 11/20 --Suprapubic catheter to remain in place --Outpatient urology follow up as scheduled -Continue Flomax  11/21: Suprapubic catheter leaking around the tube, soaking dressings, gown and linens.  Urology do not think a bowel injury is likely. Per Dr. Erlene Quan, this may be normal and related to bladder spasms.  Most important thing is urine draining through the tube itself.

## 2022-03-25 ENCOUNTER — Inpatient Hospital Stay: Payer: Medicare Other

## 2022-03-25 DIAGNOSIS — T83510D Infection and inflammatory reaction due to cystostomy catheter, subsequent encounter: Secondary | ICD-10-CM | POA: Diagnosis not present

## 2022-03-25 DIAGNOSIS — K56609 Unspecified intestinal obstruction, unspecified as to partial versus complete obstruction: Secondary | ICD-10-CM | POA: Diagnosis not present

## 2022-03-25 DIAGNOSIS — R1084 Generalized abdominal pain: Secondary | ICD-10-CM | POA: Diagnosis not present

## 2022-03-25 DIAGNOSIS — E870 Hyperosmolality and hypernatremia: Secondary | ICD-10-CM | POA: Diagnosis not present

## 2022-03-25 DIAGNOSIS — R339 Retention of urine, unspecified: Secondary | ICD-10-CM | POA: Diagnosis not present

## 2022-03-25 DIAGNOSIS — K5669 Other partial intestinal obstruction: Secondary | ICD-10-CM | POA: Diagnosis not present

## 2022-03-25 LAB — CBC
HCT: 32.6 % — ABNORMAL LOW (ref 36.0–46.0)
Hemoglobin: 10.4 g/dL — ABNORMAL LOW (ref 12.0–15.0)
MCH: 32.2 pg (ref 26.0–34.0)
MCHC: 31.9 g/dL (ref 30.0–36.0)
MCV: 100.9 fL — ABNORMAL HIGH (ref 80.0–100.0)
Platelets: 226 10*3/uL (ref 150–400)
RBC: 3.23 MIL/uL — ABNORMAL LOW (ref 3.87–5.11)
RDW: 12.6 % (ref 11.5–15.5)
WBC: 8.8 10*3/uL (ref 4.0–10.5)
nRBC: 0 % (ref 0.0–0.2)

## 2022-03-25 LAB — BASIC METABOLIC PANEL
Anion gap: 6 (ref 5–15)
BUN: 25 mg/dL — ABNORMAL HIGH (ref 8–23)
CO2: 18 mmol/L — ABNORMAL LOW (ref 22–32)
Calcium: 8.5 mg/dL — ABNORMAL LOW (ref 8.9–10.3)
Chloride: 121 mmol/L — ABNORMAL HIGH (ref 98–111)
Creatinine, Ser: 2.44 mg/dL — ABNORMAL HIGH (ref 0.44–1.00)
GFR, Estimated: 21 mL/min — ABNORMAL LOW (ref >60)
Glucose, Bld: 80 mg/dL (ref 70–99)
Potassium: 3.3 mmol/L — ABNORMAL LOW (ref 3.5–5.1)
Sodium: 145 mmol/L (ref 135–145)

## 2022-03-25 LAB — MAGNESIUM: Magnesium: 2.3 mg/dL (ref 1.7–2.4)

## 2022-03-25 LAB — GLUCOSE, CAPILLARY: Glucose-Capillary: 105 mg/dL — ABNORMAL HIGH (ref 70–99)

## 2022-03-25 MED ORDER — POTASSIUM CHLORIDE CRYS ER 20 MEQ PO TBCR
40.0000 meq | EXTENDED_RELEASE_TABLET | Freq: Once | ORAL | Status: AC
  Administered 2022-03-25: 40 meq via ORAL
  Filled 2022-03-25: qty 2

## 2022-03-25 NOTE — Assessment & Plan Note (Signed)
Versus abnormal UA due to colonization and recent instrumentation.  Started on empiric IV antibiotics on admission.  Urine culture negative. Stop antibiotics and monitor clinically. Follow up pending blood culture (no growth to date).

## 2022-03-25 NOTE — Assessment & Plan Note (Signed)
Presented with diffuse abdominal pain, N/V and constipation.  CT scan of abdomen and pelvis showed a few dilated loops of proximal small bowel within the left hemi abdomen gradually taper to the level of the right lower quadrant, where these bowel loops appear to tether to the uterus in the right adnexa.  Findings are suggestive of ileus versus partial small bowel obstruction.  11/18 >> 21 --patient continues to have recurrent obstructive symptoms when resumed on any p.o. intake, refuses to have NG tube replaced 11/22 - patient vomited 300 cc total of yellow/green material this morning.  CT scan showed High-grade mechanical small bowel obstruction, Potential transition point in the mid abdominal central mesentery 11/23 -underwent exploratory laparotomy lysis of adhesions by Dr. Lysle Pearl 11/24: Postop recovery 11/25: Patient pulled out NG tube 11/26: Waiting for bowel function return

## 2022-03-25 NOTE — Progress Notes (Signed)
OT Cancellation Note  Patient Details Name: Leslie Houston MRN: [id number] DOB: 11/12/1950   Cancelled Treatment:    Reason Eval/Treat Not Completed: Fatigue/lethargy limiting ability to participate;Other (comment) (pt reporting stomachache in afternoon). OT attempted pt at 1020 and pt reported too fatigued from getting up with PT earlier; OT attempted pt again at 1400 and pt stated that she has a stomachache and has just returned to bed. Will try tomorrow.  Waymon Amato, MS, OTR/L   [name] 03/25/2022, 2:31 PM

## 2022-03-25 NOTE — Assessment & Plan Note (Signed)
Resolved with IV hydration

## 2022-03-25 NOTE — Assessment & Plan Note (Signed)
BMI 99.24 Complicates overall prognosis and care Lifestyle modification and exercise has been discussed with patient.

## 2022-03-25 NOTE — Assessment & Plan Note (Signed)
Status post recent suprapubic and ureteral catheterization on 11/13 with Dr. Erlene Quan. Plan was to leave ureteral Foley in for 1 week to allow urethral healing with a patent tract.  --Foley removed by Dr. Erlene Quan 11/20 --Suprapubic catheter to remain in place --Outpatient urology follow up as scheduled -Continue Flomax  11/21: Suprapubic catheter leaking around the tube, soaking dressings, gown and linens.  Urology do not think a bowel injury is likely. Per Dr. Erlene Quan, this may be normal and related to bladder spasms.  Most important thing is urine draining through the tube itself.

## 2022-03-25 NOTE — Assessment & Plan Note (Signed)
Renal function appears stable, has improved since admission.   Monitor renal function closely. Consult nephrology consult if worsening

## 2022-03-25 NOTE — Progress Notes (Signed)
Progress Note   Patient: Dashanna Kinnamon JJO:[id number] DOB: 12-14-1950 DOA: 03/14/2022     11 DOS: the patient was seen and examined on 03/25/2022   Brief hospital course: Tya Haughey is a 71 y.o. 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  She presented to the ED via EMS on 03/14/2022 for evaluation of severe abdominal pain with associated with nausea and emesis.   Patient was found to have partial SBO vs ileus. She was started on empiric IV antibiotics due to concern for UTI.    Urology and General Surgery are consulted.   11/22: CT abdomen/pelvis with p.o. contrast today per surgery 11/23: CT - small bowel obstruction.  Exploratory laparotomy and lysis of additions 11/24: Postop day 1, attending service changed to surgery. 11/25: NG tube in place 11/26: Patient pulled out NG tube last night and surgical team recommends to leave it out    Assessment and Plan: * Small bowel obstruction ([place]) Presented with diffuse abdominal pain, N/V and constipation.  CT scan of abdomen and pelvis showed a few dilated loops of proximal small bowel within the left hemi abdomen gradually taper to the level of the right lower quadrant, where these bowel loops appear to tether to the uterus in the right adnexa.  Findings are suggestive of ileus versus partial small bowel obstruction.  11/18 >> 21 --patient continues to have recurrent obstructive symptoms when resumed on any p.o. intake, refuses to have NG tube replaced 11/22 - patient vomited 300 cc total of yellow/green material this morning.  CT scan showed High-grade mechanical small bowel obstruction, Potential transition point in the  mid abdominal central mesentery 11/23 -underwent exploratory laparotomy lysis of adhesions by Dr. Lysle Pearl 11/24: Postop recovery 11/25: Patient pulled out NG tube 11/26: Waiting for bowel function return  UTI (urinary tract infection) Versus abnormal UA due to colonization and recent instrumentation.  Started on empiric IV antibiotics on admission.  Urine culture negative. Stop antibiotics and monitor clinically. Follow up pending blood culture (no growth to date).  Hypernatremia Resolved with IV hydration  Obesity (BMI 30-39.9) BMI 16.01 Complicates overall prognosis and care Lifestyle modification and exercise has been discussed with patient.  Urinary retention Status post recent suprapubic and ureteral catheterization on 11/13 with Dr. Erlene Quan. Plan was to leave ureteral Foley in for 1 week to allow urethral healing with a patent tract.  --Foley removed by Dr. Erlene Quan 11/20 --Suprapubic catheter to remain in place --Outpatient urology follow up as scheduled -Continue Flomax  11/21: Suprapubic catheter leaking around the tube, soaking dressings, gown and linens.  Urology do not think a bowel injury is likely. Per Dr. Erlene Quan, this may be normal and related to bladder spasms.  Most important thing is urine draining through the tube itself.  Seizures ([place]) Continue IV Keppra until taking PO. Seizure precautions.  CKD (chronic kidney disease) stage 4, GFR 15-29 ml/min (HCC) Renal function appears stable, has improved since admission.   Monitor renal function closely. Consult nephrology consult if worsening  Partial intestinal obstruction (HCC)-resolved as of 03/22/2022 See partial SBO and surgery's recommendations  Hemiparesis affecting right side as late effect of cerebrovascular accident (CVA) (HCC)-resolved as of 03/22/2022 History of prior CVA with left-sided hemiparesis Resume aspirin once patient is able to tolerate  p.o.        Subjective: She pulled out NG tube  last night.  Denies any new complaints.  Hoping to start eating some soon  Physical Exam: Vitals:   03/24/22 1816 03/24/22 2023 03/25/22 0516 03/25/22 0825  BP: (!) 114/52 (!) 122/51 (!) 123/45 (!) 142/51  Pulse: (!) 57 (!) 56 (!) 53 (!) 54  Resp:  '18 16 15  '$ Temp: 98.4 F (36.9 C) 98.4 F (36.9 C) 98.7 F (37.1 C) 97.9 F (36.6 C)  TempSrc:      SpO2: 95% 93% 94% 95%  Weight:      Height:       71 year old female sitting in the bed comfortably without acute distress NG tube in place Lungs clear to auscultation bilaterally Cardiovascular regular rate and rhythm Abdomen soft, honeycomb dressing in place GU: Suprapubic catheter in place Neuro alert and awake, at baseline Psych normal mood and affect Data Reviewed:  KUB shows persistent dilated small bowel suggestive of ileus  Family Communication: None  Disposition: Status is: Inpatient Remains inpatient appropriate because: Management of SBO, await bowel function to return  Planned Discharge Destination: Home with Home Health   DVT prophylaxis-SCDs Time spent: 35 minutes  Author: Max Sane, MD 03/25/2022 11:56 AM  For on call review [email or web address].

## 2022-03-25 NOTE — Plan of Care (Signed)

## 2022-03-25 NOTE — Assessment & Plan Note (Signed)
Continue IV Keppra until taking PO. Seizure precautions.

## 2022-03-25 NOTE — Progress Notes (Signed)
[place] Hospital Day(s): 11.   Post op day(s): 3 Days Post-Op.   Interval History: Patient seen and examined, as usual, reports she is passing flatus.  Her NG tube fell out yesterday, apparently shortly after I had confirmed its functioning.  However, we elected not to replace it.  No acute events or new complaints overnight.   Daughter at bedside, both are quite jovial and happy.  Review of Systems:  Constitutional: denies fever, chills  Respiratory: denies any shortness of breath  Cardiovascular: denies chest pain or palpitations  Gastrointestinal: denies abdominal pain, aside from her incision.   Musculoskeletal: denies pain, decreased motor or sensation   Vital signs in last 24 hours: [min-max] current  Temp:  [97.9 F (36.6 C)-98.7 F (37.1 C)] 97.9 F (36.6 C) (11/26 0825) Pulse Rate:  [53-57] 54 (11/26 0825) Resp:  [15-18] 15 (11/26 0825) BP: (114-142)/(45-52) 142/51 (11/26 0825) SpO2:  [93 %-95 %] 95 % (11/26 0825)     Height: '5\' 11"'$  (180.3 cm) Weight: 85.3 kg BMI (Calculated): 35.54   Intake/Output last 2 shifts:  11/25 0701 - 11/26 0700 In: 1124.7 [I.V.:1124.7] Out: 1300 [Urine:600; Emesis/NG output:700]    Physical Exam:  Constitutional: alert, cooperative and no distress  Respiratory: breathing non-labored at rest  Cardiovascular: regular rate and sinus rhythm  Gastrointestinal: No remarkable peri-incisional tenderness, at baseline distention, otherwise soft, non-tender. Integumentary: Intact.  Incision clean dry and intact.  With honeycomb dressing.  Labs:     Latest Ref Rng & Units 03/25/2022    5:00 AM 03/24/2022   11:34 AM 03/22/2022    4:14 AM  CBC  WBC 4.0 - 10.5 K/uL 8.8  8.9  6.4   Hemoglobin 12.0 - 15.0 g/dL 10.4  10.9  11.8   Hematocrit 36.0 - 46.0 % 32.6  33.3  36.7   Platelets 150 - 400 K/uL 226  221  236       Latest Ref Rng & Units 03/25/2022    5:00 AM 03/24/2022   11:34 AM 03/22/2022     4:14 AM  CMP  Glucose 70 - 99 mg/dL 80  109  95   BUN 8 - 23 mg/dL '25  29  26   '$ Creatinine 0.44 - 1.00 mg/dL 2.44  2.59  2.39   Sodium 135 - 145 mmol/L 145  140  134   Potassium 3.5 - 5.1 mmol/L 3.3  3.6  3.4   Chloride 98 - 111 mmol/L 121  119  109   CO2 22 - 32 mmol/L '18  16  18   '$ Calcium 8.9 - 10.3 mg/dL 8.5  8.5  8.8      Imaging studies:  CLINICAL DATA:  Follow-up exploratory laparotomy and lysis of adhesions for small-bowel obstruction.   EXAM: ABDOMEN - 1 VIEW   COMPARISON:  03/23/2022   FINDINGS: Enteric tube is no longer visualized.   Surgical staples are noted overlying the abdomen/pelvis.   Dilated loops of small bowel are noted.   Gas in stomach noted.  Majority of the colon is not distended.   IMPRESSION: Distended small bowel loops noted with nondistended colon. This may represent ileus or continued bowel obstruction. Clinical follow-up recommended.     Electronically Signed   By: Margarette Canada M.D.   On: 03/25/2022 08:54  Assessment/Plan:  71 y.o. female with  3 Days Post-Op s/p lysis of adhesions and biopsy for small bowel obstruction, complicated by pertinent comorbidities including .  Patient Active  Problem List   Diagnosis Date Noted   Hypernatremia 03/16/2022   Small bowel obstruction ([place]) 03/14/2022   Obesity (BMI 30-39.9) 03/14/2022   Cerebrovascular accident (CVA) ([place]) 02/15/2022   Gait abnormality 02/15/2022   Urinary retention 02/15/2022   OSA (obstructive sleep apnea) 02/15/2022   History of CVA (cerebrovascular accident) 08/24/2021   Moderate episode of recurrent major depressive disorder ([place]) 06/28/2021   Statin myopathy 03/09/2020   Lymphedema 03/09/2020   Partial symptomatic epilepsy with complex partial seizures, not intractable, without status epilepticus ([place]) 02/18/2020   Bad dreams 02/18/2020   Chronic post-traumatic stress disorder (PTSD) 02/18/2020   Snoring 02/18/2020   Coronary artery disease 02/18/2020   Stroke  ([place])    Debility 07/07/2019   Hyperkalemia 07/03/2019   AKI (acute kidney injury) ([place]) 07/02/2019   Anemia of chronic disease 07/02/2019   Bradycardia 07/02/2019   Acute CHF (congestive heart failure) ([employer]) 07/02/2019   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   History of diabetes mellitus 01/22/2019   Osteopenia 01/24/2018   Anxiety 12/03/2017   Neutropenia ([place]) 12/31/2015   Seizures ([place])    Carotid stenosis    PVC's (premature ventricular contractions)    UTI (urinary tract infection)    CKD (chronic kidney disease) stage 4, GFR 15-29 ml/min ([place]) 08/19/2014   Morbid obesity ([place]) 03/04/2006   Essential hypertension 03/04/2006   GERD 03/04/2006   History of gastric ulcer 03/04/2006    -Follow-up pathology.  -Ileus persists, we will continue n.p.o., pending improved evidence of return of bowel function.    -Continue IV fluids.  Mild hypokalemia this morning, got 1 dose of p.o. potassium today.  -Repeat labs in AM.  All of the above findings and recommendations were discussed with the patient, and all of patient's questions were answered to their expressed satisfaction.  -- Ronny Bacon, M.D., [facility] 03/25/2022

## 2022-03-25 NOTE — Evaluation (Addendum)
Physical Therapy Re-Evaluation Patient Details Name: Leslie Houston MRN: [id number] DOB: 02/16/1951 Today's Date: 03/25/2022  History of Present Illness  Pt is a 71 y/o F admitted on 03/14/22 after presenting to the ED with c/o severe abdominal pain associated with nausea & emesis. Pt was found to have partial SBO vs ileus. Pt is s/p ex lap on 11/23. PMH: DM, CKD IV, depression, CVA with hearing loss, CAD, anxiety, s/p recent suprapubic catheter placement for chronic urinary retention & urethral dilation for stricture 03/12/22  Clinical Impression  Pt seen for PT re-evaluation s/p ex lap with pt agreeable to tx. Pt reports she was living alone but since being "under the weather" she has been staying with her daughter. Pt c/o abdominal soreness throughout session with PT educating pt on log rolling for comfort with pt requiring up to mod assist & heavy reliance on hospital bed features to complete. Pt is able to ambulate 1 lap around unit with RW & CGA<>supervision with min cuing to ambulate within base of AD. Anticipate pt can d/c home with assistance from family & HHPT f/u. Will continue to follow pt acutely to address balance, strengthening, and gait with LRAD.   Addendum: recommending RW as pt reports she has a walker without wheels at home.   Recommendations for follow up therapy are one component of a multi-disciplinary discharge planning process, led by the attending physician.  Recommendations may be updated based on patient status, additional functional criteria and insurance authorization.  Follow Up Recommendations Home health PT      Assistance Recommended at Discharge Intermittent Supervision/Assistance  Patient can return home with the following  A little help with walking and/or transfers;A little help with bathing/dressing/bathroom;Assistance with cooking/housework;Help with stairs or ramp for entrance    Equipment Recommendations Rolling walker (2 wheels)  Recommendations  for Other Services       Functional Status Assessment Patient has had a recent decline in their functional status and demonstrates the ability to make significant improvements in function in a reasonable and predictable amount of time.     Precautions / Restrictions Precautions Precautions: Fall Precaution Comments: suprapubic cath Restrictions Weight Bearing Restrictions: No Other Position/Activity Restrictions: log roll for comfort 2/2 abdominal incision      Mobility  Bed Mobility Overal bed mobility: Needs Assistance Bed Mobility: Rolling, Sidelying to Sit Rolling: Mod assist Sidelying to sit: Min assist, HOB elevated       General bed mobility comments: Max education/cuing for log rolling to decrease abdominal discomfort during bed mobility, extra time to upright trunk during sidelying>sitting with HOB elevated & bed rails.    Transfers Overall transfer level: Needs assistance Equipment used: Rolling walker (2 wheels) Transfers: Sit to/from Stand Sit to Stand: Min assist           General transfer comment: STS from EOB    Ambulation/Gait Ambulation/Gait assistance: Min guard, Supervision Gait Distance (Feet): 180 Feet Assistive device: Rolling walker (2 wheels) Gait Pattern/deviations: Decreased stride length Gait velocity: WNL     General Gait Details: Min cuing to ambulate within base of AD.  Stairs            Wheelchair Mobility    Modified Rankin (Stroke Patients Only)       Balance Overall balance assessment: Needs assistance Sitting-balance support: Feet supported, Bilateral upper extremity supported Sitting balance-Leahy Scale: Fair Sitting balance - Comments: supervision static sitting   Standing balance support: During functional activity, Bilateral upper extremity supported, Reliant on assistive device for  balance Standing balance-Leahy Scale: Fair                               Pertinent Vitals/Pain Pain  Assessment Pain Assessment: Faces Faces Pain Scale: Hurts even more Pain Location: abdominal incision Pain Descriptors / Indicators: Sore Pain Intervention(s): Monitored during session, Repositioned    Home Living Family/patient expects to be discharged to:: Private residence Living Arrangements: Other relatives Available Help at Discharge: Family Type of Home: Apartment Home Access: Level entry       Home Layout: One level Home Equipment: Cane - single point;Standard Environmental consultant Additional Comments: Pt reports she has a walker without wheels.    Prior Function Prior Level of Function : Independent/Modified Independent             Mobility Comments: SPC use in the home, walker community distances ADLs Comments: MOD I with ADL, assist with IADL as needed     Hand Dominance        Extremity/Trunk Assessment   Upper Extremity Assessment Upper Extremity Assessment: Overall WFL for tasks assessed    Lower Extremity Assessment Lower Extremity Assessment: Generalized weakness       Communication   Communication: HOH  Cognition Arousal/Alertness: Awake/alert Behavior During Therapy: WFL for tasks assessed/performed Overall Cognitive Status: Within Functional Limits for tasks assessed                                          General Comments      Exercises     Assessment/Plan    PT Assessment Patient needs continued PT services  PT Problem List Decreased activity tolerance;Decreased balance;Decreased mobility;Cardiopulmonary status limiting activity;Decreased strength;Decreased knowledge of precautions       PT Treatment Interventions DME instruction;Gait training;Functional mobility training;Therapeutic activities;Therapeutic exercise;Balance training;Patient/family education;Stair training;Neuromuscular re-education    PT Goals (Current goals can be found in the Care Plan section)  Acute Rehab PT Goals Patient Stated Goal: decreased  soreness PT Goal Formulation: With patient Time For Goal Achievement: 04/08/22 Potential to Achieve Goals: Good    Frequency Min 2X/week     Co-evaluation               AM-PAC PT "6 Clicks" Mobility  Outcome Measure Help needed turning from your back to your side while in a flat bed without using bedrails?: A Lot Help needed moving from lying on your back to sitting on the side of a flat bed without using bedrails?: A Little Help needed moving to and from a bed to a chair (including a wheelchair)?: A Little Help needed standing up from a chair using your arms (e.g., wheelchair or bedside chair)?: A Little Help needed to walk in hospital room?: A Little Help needed climbing 3-5 steps with a railing? : A Little 6 Click Score: 17    End of Session   Activity Tolerance: Patient tolerated treatment well Patient left: in chair;with chair alarm set;with call bell/phone within reach Nurse Communication: Mobility status;Patient requests pain meds PT Visit Diagnosis: Difficulty in walking, not elsewhere classified (R26.2);Unsteadiness on feet (R26.81)    Time: 0071-2197 PT Time Calculation (min) (ACUTE ONLY): 17 min   Charges:   PT Evaluation $PT Re-evaluation: 1 Re-eval          Lavone Nian, PT, DPT 03/25/22, 10:28 AM   Waunita Schooner 03/25/2022, 10:27  AM

## 2022-03-25 NOTE — TOC Progression Note (Addendum)
Transition of Care [facility] - Progression Note    Patient Details  Name: Leslie Houston MRN: [id number] Date of Birth: 1950-05-05  [facility]) CM/SW Contact  Eileen Stanford, LCSW Phone Number: 03/25/2022, 10:03 AM  Clinical Narrative:   [name] with [facility] states pt is active with [facility] PT and OT. PT confirmed with pt that she has all the DME needed. Will need [place] Orders prior to dc.    Expected Discharge Plan: Home/Self Care Barriers to Discharge: Continued Medical Work up  Expected Discharge Plan and Services Expected Discharge Plan: Home/Self Care       Living arrangements for the past 2 months: Single Family Home                                       Social Determinants of Health (SDOH) Interventions Food Insecurity Interventions: Inpatient TOC  Readmission Risk Interventions    03/17/2022   11:49 AM  Readmission Risk Prevention Plan  Transportation Screening Complete  PCP or Specialist Appt within 3-5 Days Complete  HRI or [place] Complete  Social Work Consult for [place] Planning/Counseling Complete  Palliative Care Screening Not Applicable  Medication Review Press photographer) Complete

## 2022-03-26 ENCOUNTER — Inpatient Hospital Stay: Payer: Medicare Other

## 2022-03-26 DIAGNOSIS — R569 Unspecified convulsions: Secondary | ICD-10-CM | POA: Diagnosis not present

## 2022-03-26 DIAGNOSIS — E669 Obesity, unspecified: Secondary | ICD-10-CM | POA: Diagnosis not present

## 2022-03-26 DIAGNOSIS — K5669 Other partial intestinal obstruction: Secondary | ICD-10-CM | POA: Diagnosis not present

## 2022-03-26 DIAGNOSIS — K56609 Unspecified intestinal obstruction, unspecified as to partial versus complete obstruction: Secondary | ICD-10-CM | POA: Diagnosis not present

## 2022-03-26 DIAGNOSIS — E870 Hyperosmolality and hypernatremia: Secondary | ICD-10-CM | POA: Diagnosis not present

## 2022-03-26 DIAGNOSIS — R1084 Generalized abdominal pain: Secondary | ICD-10-CM | POA: Diagnosis not present

## 2022-03-26 LAB — BASIC METABOLIC PANEL
Anion gap: 4 — ABNORMAL LOW (ref 5–15)
BUN: 22 mg/dL (ref 8–23)
CO2: 18 mmol/L — ABNORMAL LOW (ref 22–32)
Calcium: 8.8 mg/dL — ABNORMAL LOW (ref 8.9–10.3)
Chloride: 127 mmol/L — ABNORMAL HIGH (ref 98–111)
Creatinine, Ser: 2.23 mg/dL — ABNORMAL HIGH (ref 0.44–1.00)
GFR, Estimated: 23 mL/min — ABNORMAL LOW (ref >60)
Glucose, Bld: 102 mg/dL — ABNORMAL HIGH (ref 70–99)
Potassium: 3.7 mmol/L (ref 3.5–5.1)
Sodium: 149 mmol/L — ABNORMAL HIGH (ref 135–145)

## 2022-03-26 LAB — SURGICAL PATHOLOGY

## 2022-03-26 LAB — GLUCOSE, CAPILLARY
Glucose-Capillary: 109 mg/dL — ABNORMAL HIGH (ref 70–99)
Glucose-Capillary: 90 mg/dL (ref 70–99)

## 2022-03-26 MED ORDER — DEXTROSE 5 % IV SOLN: INTRAVENOUS | Status: DC

## 2022-03-26 MED ORDER — SODIUM CHLORIDE 0.45 % IV SOLN: INTRAVENOUS | Status: DC

## 2022-03-26 MED ORDER — INSULIN ASPART 100 UNIT/ML IJ SOLN: 0.0000 [IU] | INTRAMUSCULAR | Status: DC

## 2022-03-26 MED ORDER — TRAVASOL 10 % IV SOLN
INTRAVENOUS | Status: AC
  Filled 2022-03-26: qty 366

## 2022-03-26 NOTE — Assessment & Plan Note (Signed)
Status post recent suprapubic and ureteral catheterization on 11/13 with Dr. Erlene Quan. Plan was to leave ureteral Foley in for 1 week to allow urethral healing with a patent tract.  --Foley removed by Dr. Erlene Quan 11/20 --Suprapubic catheter to remain in place --Outpatient urology follow up as scheduled -Continue Flomax  11/21: Suprapubic catheter leaking around the tube, soaking dressings, gown and linens.  Urology do not think a bowel injury is likely. Per Dr. Erlene Quan, this may be normal and related to bladder spasms.  Most important thing is urine draining through the tube itself.

## 2022-03-26 NOTE — Progress Notes (Signed)
Subjective:  CC: Leslie Houston is a 71 y.o. female  Hospital stay day 12, 4 Days Post-Op partial SBO versus ileus  HPI: Another small episode of emesis this a.m.  refusing NG.  Passing flatus, no BM reported in chart.  ROS:  General: Denies weight loss, weight gain, fatigue, fevers, chills, and night sweats. Heart: Denies chest pain, palpitations, racing heart, irregular heartbeat, leg pain or swelling, and decreased activity tolerance. Respiratory: Denies breathing difficulty, shortness of breath, wheezing, cough, and sputum. GI: Denies change in appetite, heartburn, nausea, vomiting, constipation, diarrhea, and blood in stool. GU: Denies difficulty urinating, pain with urinating, urgency, frequency, blood in urine.   Objective:   Temp:  [97.9 F (36.6 C)-98.6 F (37 C)] 98 F (36.7 C) (11/27 0720) Pulse Rate:  [57-58] 57 (11/27 0720) Resp:  [18] 18 (11/27 0720) BP: (143-158)/(40-62) 146/62 (11/27 0720) SpO2:  [93 %-94 %] 93 % (11/27 0720)     Height: '5\' 11"'$  (180.3 cm) Weight: 85.3 kg BMI (Calculated): 35.54   Intake/Output this shift:   Intake/Output Summary (Last 24 hours) at 03/26/2022 8502 Last data filed at 03/26/2022 0800 Gross per 24 hour  Intake 3332.97 ml  Output 350 ml  Net 2982.97 ml    Constitutional :  alert, cooperative, appears stated age, and no distress  Respiratory:  clear to auscultation bilaterally  Cardiovascular:  regular rate and rhythm  Gastrointestinal: soft, non-tender; bowel sounds normal; no masses,  no organomegaly.  Obvious tympany and distention present  Skin: Cool and moist. Staple line c/d/i  Psychiatric: Normal affect, non-agitated, not confused       LABS:     Latest Ref Rng & Units 03/26/2022    4:15 AM 03/25/2022    5:00 AM 03/24/2022   11:34 AM  CMP  Glucose 70 - 99 mg/dL 102  80  109   BUN 8 - 23 mg/dL '22  25  29   '$ Creatinine 0.44 - 1.00 mg/dL 2.23  2.44  2.59   Sodium 135 - 145 mmol/L 149  145  140   Potassium 3.5 -  5.1 mmol/L 3.7  3.3  3.6   Chloride 98 - 111 mmol/L 127  121  119   CO2 22 - 32 mmol/L '18  18  16   '$ Calcium 8.9 - 10.3 mg/dL 8.8  8.5  8.5       Latest Ref Rng & Units 03/25/2022    5:00 AM 03/24/2022   11:34 AM 03/22/2022    4:14 AM  CBC  WBC 4.0 - 10.5 K/uL 8.8  8.9  6.4   Hemoglobin 12.0 - 15.0 g/dL 10.4  10.9  11.8   Hematocrit 36.0 - 46.0 % 32.6  33.3  36.7   Platelets 150 - 400 K/uL 226  221  236     RADS: CLINICAL DATA:  Bowel obstruction suspected   EXAM: CT ABDOMEN AND PELVIS WITHOUT CONTRAST   TECHNIQUE: Multidetector CT imaging of the abdomen and pelvis was performed following the standard protocol without IV contrast.   RADIATION DOSE REDUCTION: This exam was performed according to the departmental dose-optimization program which includes automated exposure control, adjustment of the mA and/or kV according to patient size and/or use of iterative reconstruction technique.   COMPARISON:  None Available.   FINDINGS: Lower chest: Atelectasis at the RIGHT lung base   Hepatobiliary: No focal hepatic lesion. Normal gallbladder. No biliary duct dilatation. Common bile duct is normal.   Pancreas: Pancreas is normal. No ductal dilatation. No  pancreatic inflammation.   Spleen: Normal spleen   Adrenals/urinary tract: Adrenal glands normal. Kidneys are atrophic. Ureters normal. Insert a pubic catheter in the bladder. Bladder collapsed.   Stomach/Bowel: Stomach is distended with oral contrast. The duodenum is fluid filled and distended. The fourth portion the duodenum measures 5.3 cm. There is a fluid level the proximal small bowel. Proximal bowel measures 4.8 cm image 48/2). Transition point in the mid abdominal mesentery from dilated small bowel to collapsed small bowel occurs on image 45/2. distal small bowel is collapsed leading up the terminal ileum. Appendix is normal. Colon rectosigmoid colon normal.   No pneumatosis or portal venous gas.  No perforation  or free fluid.   Vascular/Lymphatic: Abdominal aorta is normal caliber. No periportal or retroperitoneal adenopathy. No pelvic adenopathy.   Reproductive: Uterus and adnexa unremarkable.   Other: No free fluid.   Musculoskeletal: No aggressive osseous lesion.   IMPRESSION: 1. High-grade mechanical small bowel obstruction. Recommend surgical consultation. 2. Potential transition point in the mid abdominal central mesentery. 3. No portal venous gas or pneumatosis. No perforation or free fluid.   These results will be called to the ordering clinician or representative by the Radiologist Assistant, and communication documented in the PACS or [employer].     Electronically Signed   By: Suzy Bouchard M.D.   On: 03/21/2022 14:43 Assessment:   S/p ex lap, lysis of adhesions.  Now with postop ileus.  Patient again refusing NG tube despite explanation and recommendation.  We will continue to monitor for now.  Start TPN in the meantime.  PT eval appreciated.  Rolling walker ordered along with home PT  labs/images/medications/previous chart entries reviewed personally and relevant changes/updates noted above.

## 2022-03-26 NOTE — Progress Notes (Signed)
Initial Nutrition Assessment  DOCUMENTATION CODES:   Not applicable  INTERVENTION:   -TPN management per pharmacy -Monitor Mg, K, and Phos daily and replete as needed  due to high refeeding risk -RD to monitor for diet advancement and adjust supplement regimen as appropriate  NUTRITION DIAGNOSIS:   Increased nutrient needs related to post-op healing as evidenced by estimated needs.  GOAL:   Patient will meet greater than or equal to 90% of their needs  MONITOR:   Diet advancement  REASON FOR ASSESSMENT:   Consult New TPN/TNA  ASSESSMENT:   Ptm with medical history significant for diabetes mellitus with complications of stage IV chronic kidney disease, depression, history of prior CVA with residual hearing loss, coronary artery disease, anxiety disorder, status post recent suprapubic catheter placement for chronic urinary retention and urethral dilation for stricture with urology on 11/13.  She presented for evaluation of severe abdominal pain with associated with nausea and emesis.  Pt admitted with SBO.   11/23- s/p ex lap with LOA 11/25- NGT placed 11/26- NT pulled out by pt 11/27- refusing NGT replacement, starting TPN today  Reviewed I/O's: +3.3 L x 24 hours and +1.1 L since admission  UOP: 350 ml x 24 hours  Case discussed with pharmacist, who reports plan to start TPN today. Disused nutritional needs and will also start TPN at 1/3 goal rate due to concern for refeeding risk.    Spoke with pt, who was sitting in recliner chair at time of visit. Pt reports feeling well and vocalizes pleasure of having NGT out and reports she does not want it replaced. She denies any nausea or vomiting at this time, but noted abdominal distention. Per pt, she passed flatus today and did well walking the hallways with PT.   Pt shares good appetite PTA. Per pt, she was recently admitted with kidney problems "but my kidneys are good now". Per pt, she has had inadequate nutrition since  admission secondary to being primarily on a liquid diet. Pt verbalized plan for TPN; RD discussed how she will receive her nutrition with TPN and potential for diet advancement.   Reviewed wt hx; wt has been stable over the past month. Pt denies any weight loss.   Per pharmacy note, plan to initiate TPN at 25 ml/hr, which provides 530 kcals and 37 grams protein, meeting 25% of estimated kcal needs and 32% of estimated protein needs. Recommend slow advancement and monitoring of electrolytes secondary to high refeeding risk due to prolonged NPO/ clear liquid diet status.   Medications reviewed and include dextrose 5% solution @ 100 ml/hr and keppra.   Labs reviewed: CBGS: 105 (inpatient orders for glycemic control are 0-9 units insulin aspart every 4 hours).    NUTRITION - FOCUSED PHYSICAL EXAM:  Flowsheet Row Most Recent Value  Orbital Region No depletion  Upper Arm Region Mild depletion  Thoracic and Lumbar Region No depletion  Buccal Region No depletion  Temple Region No depletion  Clavicle Bone Region No depletion  Clavicle and Acromion Bone Region No depletion  Scapular Bone Region No depletion  Dorsal Hand No depletion  Patellar Region No depletion  Anterior Thigh Region No depletion  Posterior Calf Region No depletion  Edema (RD Assessment) Mild  Hair Reviewed  Eyes Reviewed  Mouth Reviewed  Skin Reviewed  Nails Reviewed       Diet Order:   Diet Order             Diet NPO time specified Except for:  Sips with Meds  Diet effective now                   EDUCATION NEEDS:   Education needs have been addressed  Skin:  Skin Assessment: Skin Integrity Issues: Skin Integrity Issues:: Incisions Incisions: closed abdomen and perineum  Last BM:  03/24/22  Height:   Ht Readings from Last 1 Encounters:  03/14/22 '5\' 11"'$  (1.803 m)    Weight:   Wt Readings from Last 1 Encounters:  03/26/22 86.8 kg    Ideal Body Weight:  70.5 kg  BMI:  Body mass index is  26.69 kg/m.  Estimated Nutritional Needs:   Kcal:  2100-2300  Protein:  115-130 grams  Fluid:  > 2 L    Loistine Chance, RD, LDN, [place] Registered Dietitian II Certified Diabetes Care and Education Specialist Please refer to [facility] for RD and/or RD on-call/weekend/after hours pager

## 2022-03-26 NOTE — Progress Notes (Signed)
Progress Note   Patient: Leslie Houston QIW:[id number] DOB: 1950/07/03 DOA: 03/14/2022     12 DOS: the patient was seen and examined on 03/26/2022   Brief hospital course: Jacqulyne Gladue is a 71 y.o. 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  She presented to the ED via EMS on 03/14/2022 for evaluation of severe abdominal pain with associated with nausea and emesis.   Patient was found to have partial SBO vs ileus. She was started on empiric IV antibiotics due to concern for UTI.    Urology and General Surgery are consulted.   11/22: CT abdomen/pelvis with p.o. contrast today per surgery 11/23: CT - small bowel obstruction.  Exploratory laparotomy and lysis of additions 11/24: Postop day 1, attending service changed to surgery. 11/25: NG tube in place 11/26: Patient pulled out NG tube last night and surgical team recommends to leave it out as patient refused 11/27: Patient refusing NG tube, starting TPN    Assessment and Plan: * Small bowel obstruction ([place]) Presented with diffuse abdominal pain, N/V and constipation.  CT scan of abdomen and pelvis showed a few dilated loops of proximal small bowel within the left hemi abdomen gradually taper to the level of the right lower quadrant, where these bowel loops appear to tether to the uterus in the right adnexa.  Findings are suggestive of ileus versus partial small bowel obstruction.  11/18 >> 21 --patient continues to have recurrent obstructive symptoms when resumed on any p.o. intake, refuses to have NG tube replaced 11/22 - patient vomited 300 cc total of yellow/green material this morning.  CT scan showed High-grade  mechanical small bowel obstruction, Potential transition point in the mid abdominal central mesentery 11/23 -underwent exploratory laparotomy lysis of adhesions by Dr. Lysle Pearl 11/24: Postop recovery 11/25: Patient pulled out NG tube 11/26: Waiting for bowel function return 11/27: Started TPN  UTI (urinary tract infection) Versus abnormal UA due to colonization and recent instrumentation.  UTI ruled out  Hypernatremia Resolved with IV hydration  Obesity (BMI 30-39.9) BMI 41.74 Complicates overall prognosis and care Lifestyle modification and exercise has been discussed with patient.  Urinary retention Status post recent suprapubic and ureteral catheterization on 11/13 with Dr. Erlene Quan. Plan was to leave ureteral Foley in for 1 week to allow urethral healing with a patent tract.  --Foley removed by Dr. Erlene Quan 11/20 --Suprapubic catheter to remain in place --Outpatient urology follow up as scheduled -Continue Flomax  11/21: Suprapubic catheter leaking around the tube, soaking dressings, gown and linens.  Urology do not think a bowel injury is likely. Per Dr. Erlene Quan, this may be normal and related to bladder spasms.  Most important thing is urine draining through the tube itself.  Seizures ([place]) Continue IV Keppra until taking PO. Seizure precautions.  CKD (chronic kidney disease) stage 4, GFR 15-29 ml/min (HCC) Renal function appears stable, has improved since admission.   Monitor renal function closely. Consult nephrology consult if worsening  Partial intestinal obstruction (HCC)-resolved as of 03/22/2022 See partial SBO and surgery's recommendations  Hemiparesis affecting right side as late effect of cerebrovascular accident (CVA) (HCC)-resolved as of 03/22/2022 History of prior CVA with left-sided hemiparesis Resume aspirin once patient is able to tolerate p.o.        Subjective:  Refusing NG tube.  Hoping to eat something soon.  Passing flatus but no bowel  movement  Physical Exam: Vitals:   03/26/22 0442 03/26/22 0720 03/26/22 0912 03/26/22 1020  BP: (!) 156/60 (!) 146/62  (!) 152/73  Pulse: (!) 58 (!) 57  (!) 59  Resp: '18 18  18  '$ Temp: 97.9 F (36.6 C) 98 F (36.7 C)    TempSrc: Oral Oral    SpO2: 94% 93%  93%  Weight:   86.8 kg   Height:       71 year old female sitting in the bed comfortably without acute distress Lungs clear to auscultation bilaterally Cardiovascular regular rate and rhythm Abdomen soft, honeycomb dressing in place GU: Suprapubic catheter in place Neuro alert and awake, at baseline Psych normal mood and affect Data Reviewed:  Sodium 149  Family Communication: None  Disposition: Status is: Inpatient Remains inpatient appropriate because: Management of SBO  Planned Discharge Destination: Home with Home Health   DVT prophylaxis-SCDs Time spent: 35 minutes  Author: Max Sane, MD 03/26/2022 2:36 PM  For on call review [email or web address].

## 2022-03-26 NOTE — Progress Notes (Signed)
PHARMACY - TOTAL PARENTERAL NUTRITION CONSULT NOTE   Indication: Prolonged ileus  Patient Measurements: Height: '5\' 11"'$  (180.3 cm) Weight: 86.8 kg (191 lb 6.4 oz) IBW/kg (Calculated) : 70.8   Body mass index is 26.69 kg/m. Usual Weight: 86.8 kg  Assessment:  71 year old female admitted with small bowel obstruction s/p lysis of adhesions. Past medical history includes diabetes mellitus (A1c 4.7% on 12/05/21) CKD4, depression, history of prior CVA, CAD, anxiety. On 11/25, patient pulled out NG tube. Starting TPN for prolonged ileus.  Glucose / Insulin:  CBGs 80-100 without sliding scale  Electrolytes:  Na 140>145>149 on D5-NS @ 100 mL/hr K+ 3.3 11/26 > 3.7 11/27. Replaced with Kcl PO 40 mEq x 1 11/26.  Renal: Scr 2.23 at baseline  Hepatic: 11/15 AST/ALT: 16/9 ; Tbili 0.6  Intake / Output; MIVF:  MIVF D5-NS @ 100 mL/hr I&O +7.7L Intake: +3.3L Output: -350 mL (urine)  GI Imaging: --CT scan of abdomen and pelvis: few dilated loops of proximal small bowel within the left hemi abdomen gradually taper to the level of the right lower quadrant, where these bowel loops appear to tether to the uterus in the right adnexa.   GI Surgeries / Procedures:  11/23 Exploratory laparotomy   Central access:  PICC double lumen 03/22/22  TPN start date:  03/26/22  Nutritional Goals: Goal TPN rate is 65 mL/hr (provides 61 g of protein and 2300 kcals per day)  RD Assessment:   Kcal:  2100-2300   Protein:  115-130 grams   Fluid:  > 2 L  Current Nutrition:  NPO  Plan:  Start TPN at 25 mL/hr at 1800 Electrolytes in TPN: Na 47mq/L, K 5674m/L, Ca 74m70mL, Mg 74mE46m, and Phos 174mm72m. Cl:Ac 1:1 High refeeding risk, CKD4 with renal function at baseline Add standard MVI and trace elements to TPN Initiate Sensitive q8h SSI and adjust as needed  Reduce MIVF to 50 mL/hr at 1800. Adjust fluids - D5-NS to 1/2NS '@1800'$ . Monitor TPN labs on Mon/Thurs   CarolWynelle Cleveland7/2023,10:43  AM

## 2022-03-26 NOTE — Assessment & Plan Note (Signed)
Versus abnormal UA due to colonization and recent instrumentation.  UTI ruled out

## 2022-03-26 NOTE — Assessment & Plan Note (Signed)
BMI 20.23 Complicates overall prognosis and care Lifestyle modification and exercise has been discussed with patient.

## 2022-03-26 NOTE — Progress Notes (Signed)
Physical Therapy Treatment Patient Details Name: Leslie Houston MRN: [id number] DOB: 08/05/1950 Today's Date: 03/26/2022   History of Present Illness Pt is a 71 y/o F admitted on 03/14/22 after presenting to the ED with c/o severe abdominal pain associated with nausea & emesis. Pt was found to have partial SBO vs ileus. Pt is s/p ex lap on 11/23. PMH: DM, CKD IV, depression, CVA with hearing loss, CAD, anxiety, s/p recent suprapubic catheter placement for chronic urinary retention & urethral dilation for stricture 03/12/22    PT Comments    Patient seen for PT tx session but declines out of chair mobility in hallway due to fatigue, however agreeable to therex. Performed seated and long sitting exercises to promote LE strengthening. Complaining of mild abdominal soreness with exercises. Encouraged patient to ambulate in hallway this PM for improved activity tolerance and strength, patient agreeable. D/c plan remains appropriate.     Recommendations for follow up therapy are one component of a multi-disciplinary discharge planning process, led by the attending physician.  Recommendations may be updated based on patient status, additional functional criteria and insurance authorization.  Follow Up Recommendations  Home health PT     Assistance Recommended at Discharge Intermittent Supervision/Assistance  Patient can return home with the following A little help with walking and/or transfers;A little help with bathing/dressing/bathroom;Assistance with cooking/housework;Help with stairs or ramp for entrance   Equipment Recommendations  Rolling Kathaleya Mcduffee (2 wheels)    Recommendations for Other Services       Precautions / Restrictions Precautions Precautions: Fall Precaution Comments: suprapubic cath Restrictions Weight Bearing Restrictions: No     Mobility  Bed Mobility               General bed mobility comments: OOB in recliner    Transfers                    General transfer comment: declined standing from recliner despite encouragement    Ambulation/Gait                   Stairs             Wheelchair Mobility    Modified Rankin (Stroke Patients Only)       Balance Overall balance assessment: Needs assistance                                          Cognition Arousal/Alertness: Awake/alert Behavior During Therapy: WFL for tasks assessed/performed Overall Cognitive Status: Within Functional Limits for tasks assessed                                          Exercises General Exercises - Lower Extremity Ankle Circles/Pumps: Both, 10 reps, Seated Long Arc Quad: Both, 10 reps, Seated Heel Slides: Both, 10 reps (long sitting) Hip ABduction/ADduction: Both, 10 reps (long sitting) Straight Leg Raises: Both, 10 reps (long sitting) Hip Flexion/Marching: Both, 10 reps, Seated Toe Raises: Both, 10 reps, Seated    General Comments General comments (skin integrity, edema, etc.): Pt mobilized with RW a lap around the unit SBA-CGA, no loss of balance, moderate-slow pace, to work on activity tolerance for ADLs. No shortness of breath. Pt left seated in recliner at end of session. All needs in reach.  Pertinent Vitals/Pain Pain Assessment Pain Assessment: Faces Faces Pain Scale: Hurts a little bit Pain Location: abdominal incision Pain Descriptors / Indicators: Sore Pain Intervention(s): Monitored during session    Home Living                          Prior Function            PT Goals (current goals can now be found in the care plan section) Acute Rehab PT Goals PT Goal Formulation: With patient Time For Goal Achievement: 04/08/22 Potential to Achieve Goals: Good Progress towards PT goals: Progressing toward goals    Frequency    Min 2X/week      PT Plan Current plan remains appropriate    Co-evaluation              AM-PAC PT "6 Clicks"  Mobility   Outcome Measure  Help needed turning from your back to your side while in a flat bed without using bedrails?: A Little Help needed moving from lying on your back to sitting on the side of a flat bed without using bedrails?: A Little Help needed moving to and from a bed to a chair (including a wheelchair)?: A Little Help needed standing up from a chair using your arms (e.g., wheelchair or bedside chair)?: A Little Help needed to walk in hospital room?: A Little Help needed climbing 3-5 steps with a railing? : A Little 6 Click Score: 18    End of Session   Activity Tolerance: Patient tolerated treatment well Patient left: in chair;with chair alarm set;with call bell/phone within reach Nurse Communication: Mobility status PT Visit Diagnosis: Difficulty in walking, not elsewhere classified (R26.2);Unsteadiness on feet (R26.81)     Time: 1761-6073 PT Time Calculation (min) (ACUTE ONLY): 13 min  Charges:  $Therapeutic Exercise: 8-22 mins                     Adley Mazurowski A. Gilford Rile PT, DPT [facility] - Acute Rehabilitation Services    Demetrius Barrell A Kieron Kantner 03/26/2022, 1:01 PM

## 2022-03-26 NOTE — Assessment & Plan Note (Signed)
Resolved with IV hydration

## 2022-03-26 NOTE — Assessment & Plan Note (Signed)
Continue IV Keppra until taking PO. Seizure precautions.

## 2022-03-26 NOTE — Assessment & Plan Note (Signed)
Presented with diffuse abdominal pain, N/V and constipation.  CT scan of abdomen and pelvis showed a few dilated loops of proximal small bowel within the left hemi abdomen gradually taper to the level of the right lower quadrant, where these bowel loops appear to tether to the uterus in the right adnexa.  Findings are suggestive of ileus versus partial small bowel obstruction.  11/18 >> 21 --patient continues to have recurrent obstructive symptoms when resumed on any p.o. intake, refuses to have NG tube replaced 11/22 - patient vomited 300 cc total of yellow/green material this morning.  CT scan showed High-grade mechanical small bowel obstruction, Potential transition point in the mid abdominal central mesentery 11/23 -underwent exploratory laparotomy lysis of adhesions by Dr. Lysle Pearl 11/24: Postop recovery 11/25: Patient pulled out NG tube 11/26: Waiting for bowel function return 11/27: Started TPN

## 2022-03-26 NOTE — Progress Notes (Signed)
Mobility Specialist - Progress Note   03/26/22 1600  Mobility  Activity Transferred from chair to bed;Ambulated with assistance in room  Level of Assistance Standby assist, set-up cues, supervision of patient - no hands on  Assistive Device Front wheel walker  Distance Ambulated (ft) 6 ft  Activity Response Tolerated well  $Mobility charge 1 Mobility     Pt ambulated chair-bed with minG. MinA for STS with extra time. ModA for bed mobility to return supine. Pt left in bed with alarm set, needs in reach. Family at bedside.    Kathee Delton Mobility Specialist 03/26/22, 4:12 PM

## 2022-03-26 NOTE — Progress Notes (Signed)
Occupational Therapy Treatment Patient Details Name: Leslie Houston MRN: [id number] DOB: September 28, 1950 Today's Date: 03/26/2022   History of present illness Pt is a 71 y/o F admitted on 03/14/22 after presenting to the ED with c/o severe abdominal pain associated with nausea & emesis. Pt was found to have partial SBO vs ileus. Pt is s/p ex lap on 11/23. PMH: DM, CKD IV, depression, CVA with hearing loss, CAD, anxiety, s/p recent suprapubic catheter placement for chronic urinary retention & urethral dilation for stricture 03/12/22   OT comments  Pt received seated in bed (HOB elevated). Appearing alert and ready to work with OT; willing to work with OT on functional mobility; states she has done her ADLs for the morning. T/f SBA to EOB; used RW for a lap around the unit. See flowsheet below for further details of session. Left seated in recliner; BIL LE elevated; with all needs in reach.     Recommendations for follow up therapy are one component of a multi-disciplinary discharge planning process, led by the attending physician.  Recommendations may be updated based on patient status, additional functional criteria and insurance authorization.    Follow Up Recommendations  Home health OT     Assistance Recommended at Discharge Intermittent Supervision/Assistance  Patient can return home with the following  Assistance with cooking/housework;A little help with bathing/dressing/bathroom   Equipment Recommendations  Tub/shower bench    Recommendations for Other Services      Precautions / Restrictions Precautions Precautions: Fall Restrictions Weight Bearing Restrictions: No       Mobility Bed Mobility               General bed mobility comments: SBA with rails and HOB raised to t/f to EOB.    Transfers Overall transfer level: Needs assistance Equipment used: Rolling walker (2 wheels) Transfers: Sit to/from Stand Sit to Stand: Min guard                 Balance                                            ADL either performed or assessed with clinical judgement   ADL                                              Extremity/Trunk Assessment Upper Extremity Assessment Upper Extremity Assessment: Overall WFL for tasks assessed            Vision       Perception     Praxis      Cognition Arousal/Alertness: Awake/alert Behavior During Therapy: WFL for tasks assessed/performed Overall Cognitive Status: Within Functional Limits for tasks assessed                                 General Comments: Pleasant and cooperative.        Exercises      Shoulder Instructions       General Comments Pt mobilized with RW a lap around the unit SBA-CGA, no loss of balance, moderate-slow pace, to work on activity tolerance for ADLs. No shortness of breath. Pt left seated in recliner at end of session. All needs in reach.  Pertinent Vitals/ Pain       Pain Assessment Pain Assessment: No/denies pain  Home Living                                          Prior Functioning/Environment              Frequency  Min 2X/week        Progress Toward Goals  OT Goals(current goals can now be found in the care plan section)  Progress towards OT goals: Progressing toward goals  Acute Rehab OT Goals Patient Stated Goal: Go home; eat something OT Goal Formulation: With patient Time For Goal Achievement: 04/01/22 Potential to Achieve Goals: Good ADL Goals Pt Will Perform Grooming: with modified independence;standing Pt Will Perform Lower Body Dressing: with modified independence Pt Will Transfer to Toilet: with modified independence;ambulating Pt Will Perform Toileting - Clothing Manipulation and hygiene: with modified independence;sit to/from stand  Plan Frequency remains appropriate    Co-evaluation                 AM-PAC OT "6 Clicks" Daily Activity      Outcome Measure   Help from another person eating meals?: None Help from another person taking care of personal grooming?: None Help from another person toileting, which includes using toliet, bedpan, or urinal?: A Little Help from another person bathing (including washing, rinsing, drying)?: A Little Help from another person to put on and taking off regular upper body clothing?: None Help from another person to put on and taking off regular lower body clothing?: A Little 6 Click Score: 21    End of Session Equipment Utilized During Treatment: Rolling walker (2 wheels)  OT Visit Diagnosis: Unsteadiness on feet (R26.81)   Activity Tolerance Patient tolerated treatment well   Patient Left in chair;with chair alarm set;with call bell/phone within reach   Nurse Communication Mobility status        Time: 9381-0175 OT Time Calculation (min): 20 min  Charges: OT General Charges $OT Visit: 1 Visit OT Treatments $Therapeutic Activity: 8-22 mins  Waymon Amato, MS, OTR/L   [name] 03/26/2022, 11:41 AM

## 2022-03-26 NOTE — Assessment & Plan Note (Signed)
Renal function appears stable, has improved since admission.   Monitor renal function closely. Consult nephrology consult if worsening

## 2022-03-27 ENCOUNTER — Inpatient Hospital Stay: Payer: Medicare Other

## 2022-03-27 DIAGNOSIS — R569 Unspecified convulsions: Secondary | ICD-10-CM | POA: Diagnosis not present

## 2022-03-27 DIAGNOSIS — K5669 Other partial intestinal obstruction: Secondary | ICD-10-CM | POA: Diagnosis not present

## 2022-03-27 DIAGNOSIS — T83510D Infection and inflammatory reaction due to cystostomy catheter, subsequent encounter: Secondary | ICD-10-CM | POA: Diagnosis not present

## 2022-03-27 DIAGNOSIS — R1084 Generalized abdominal pain: Secondary | ICD-10-CM | POA: Diagnosis not present

## 2022-03-27 DIAGNOSIS — K56609 Unspecified intestinal obstruction, unspecified as to partial versus complete obstruction: Secondary | ICD-10-CM | POA: Diagnosis not present

## 2022-03-27 DIAGNOSIS — E870 Hyperosmolality and hypernatremia: Secondary | ICD-10-CM | POA: Diagnosis not present

## 2022-03-27 LAB — CBC
HCT: 30.7 % — ABNORMAL LOW (ref 36.0–46.0)
Hemoglobin: 9.7 g/dL — ABNORMAL LOW (ref 12.0–15.0)
MCH: 32.4 pg (ref 26.0–34.0)
MCHC: 31.6 g/dL (ref 30.0–36.0)
MCV: 102.7 fL — ABNORMAL HIGH (ref 80.0–100.0)
Platelets: 185 10*3/uL (ref 150–400)
RBC: 2.99 MIL/uL — ABNORMAL LOW (ref 3.87–5.11)
RDW: 12.8 % (ref 11.5–15.5)
WBC: 6.7 10*3/uL (ref 4.0–10.5)
nRBC: 0 % (ref 0.0–0.2)

## 2022-03-27 LAB — TRIGLYCERIDES: Triglycerides: 66 mg/dL (ref <150)

## 2022-03-27 LAB — COMPREHENSIVE METABOLIC PANEL
ALT: 20 U/L (ref 0–44)
AST: 29 U/L (ref 15–41)
Albumin: 2.5 g/dL — ABNORMAL LOW (ref 3.5–5.0)
Alkaline Phosphatase: 114 U/L (ref 38–126)
Anion gap: 3 — ABNORMAL LOW (ref 5–15)
BUN: 24 mg/dL — ABNORMAL HIGH (ref 8–23)
CO2: 17 mmol/L — ABNORMAL LOW (ref 22–32)
Calcium: 8.6 mg/dL — ABNORMAL LOW (ref 8.9–10.3)
Chloride: 125 mmol/L — ABNORMAL HIGH (ref 98–111)
Creatinine, Ser: 2.22 mg/dL — ABNORMAL HIGH (ref 0.44–1.00)
GFR, Estimated: 23 mL/min — ABNORMAL LOW (ref >60)
Glucose, Bld: 108 mg/dL — ABNORMAL HIGH (ref 70–99)
Potassium: 3.6 mmol/L (ref 3.5–5.1)
Sodium: 145 mmol/L (ref 135–145)
Total Bilirubin: 0.7 mg/dL (ref 0.3–1.2)
Total Protein: 5.7 g/dL — ABNORMAL LOW (ref 6.5–8.1)

## 2022-03-27 LAB — GLUCOSE, CAPILLARY
Glucose-Capillary: 105 mg/dL — ABNORMAL HIGH (ref 70–99)
Glucose-Capillary: 109 mg/dL — ABNORMAL HIGH (ref 70–99)
Glucose-Capillary: 79 mg/dL (ref 70–99)
Glucose-Capillary: 82 mg/dL (ref 70–99)
Glucose-Capillary: 87 mg/dL (ref 70–99)
Glucose-Capillary: 95 mg/dL (ref 70–99)

## 2022-03-27 LAB — MAGNESIUM: Magnesium: 2.1 mg/dL (ref 1.7–2.4)

## 2022-03-27 LAB — PHOSPHORUS: Phosphorus: 2.5 mg/dL (ref 2.5–4.6)

## 2022-03-27 MED ORDER — TRAVASOL 10 % IV SOLN
INTRAVENOUS | Status: AC
  Filled 2022-03-27: qty 366

## 2022-03-27 MED ORDER — DEXTROSE 5 % IV SOLN: INTRAVENOUS | Status: DC

## 2022-03-27 MED ORDER — SODIUM CHLORIDE 0.45 % IV SOLN: INTRAVENOUS | Status: DC

## 2022-03-27 NOTE — Assessment & Plan Note (Signed)
Continue IV Keppra until taking PO. Seizure precautions.

## 2022-03-27 NOTE — Assessment & Plan Note (Signed)
On D5W.  Sodium 145

## 2022-03-27 NOTE — Progress Notes (Signed)
Mobility Specialist - Progress Note   03/27/22 1000  Mobility  Activity Stood at bedside  Level of Assistance Standby assist, set-up cues, supervision of patient - no hands on  Assistive Device Front wheel walker  Distance Ambulated (ft) 3 ft  Activity Response Tolerated well  $Mobility charge 1 Mobility     Pt lying in bed upon arrival, utilizing RA. Pt declined chair transfer and/or ambulation at this time but is agreeable to stand for linen change d/t soiled sheets. Pt had small/medium watery BM on arrival. Pt completed bed mobility with modA. Still sore at abdominal incision with movement. STS with minA and tolerated upright position ~ 2 minutes for peri-care---assist needed d/t need for BUE support. Pt took several lateral steps towards HOB before returning supine with modA. Alarm set, needs in reach, family/friend at bedside. RN notified.    Kathee Delton Mobility Specialist 03/27/22, 11:03 AM

## 2022-03-27 NOTE — Assessment & Plan Note (Signed)
Presented with diffuse abdominal pain, N/V and constipation.  CT scan of abdomen and pelvis showed a few dilated loops of proximal small bowel within the left hemi abdomen gradually taper to the level of the right lower quadrant, where these bowel loops appear to tether to the uterus in the right adnexa.  Findings are suggestive of ileus versus partial small bowel obstruction.  11/18 >> 21 --patient continues to have recurrent obstructive symptoms when resumed on any p.o. intake, refuses to have NG tube replaced 11/22 - patient vomited 300 cc total of yellow/green material this morning.  CT scan showed High-grade mechanical small bowel obstruction, Potential transition point in the mid abdominal central mesentery 11/23 -underwent exploratory laparotomy lysis of adhesions by Dr. Lysle Pearl 11/24: Postop recovery 11/25: Patient pulled out NG tube 11/26: Waiting for bowel function return 11/27: Started TPN 11/28: NG tube reinserted, KUB shows persistent ileus

## 2022-03-27 NOTE — Progress Notes (Signed)
PHARMACY - TOTAL PARENTERAL NUTRITION CONSULT NOTE   Indication: Prolonged ileus  Patient Measurements: Height: '5\' 11"'$  (180.3 cm) Weight: 86.8 kg (191 lb 6.4 oz) IBW/kg (Calculated) : 70.8   Body mass index is 26.69 kg/m. Usual Weight: 86.8 kg  Assessment:  71 year old female admitted with small bowel obstruction s/p lysis of adhesions. Past medical history includes diabetes mellitus (A1c 4.7% on 12/05/21) CKD4, depression, history of prior CVA, CAD, anxiety. On 11/25, patient pulled out NG tube. Starting TPN for prolonged ileus.  Glucose / Insulin:  CBGs 80-100 without sliding scale  Electrolytes:  Hypernatremia improving Na+ 149 > 145 D5-NS @ 100 mL/hr through 11/27 PM D5 @ 100 mL/hr started 11/27 PM  K+ improved. 11/26 K+3.3 (given Kcl PO 40 mEq) > 11/27 K+3.7   Renal: Scr 2.23 at baseline  Hepatic: 11/15 AST/ALT: 16/9, Tbili 0.6  Intake / Output; MIVF:  MIVF D5-NS @ 100 mL/hr > D5W '@100'$   I&O +7.8L Intake: +12L from intravenous source Output: -4.650L (urine)  GI Imaging: --CT scan of abdomen and pelvis: few dilated loops of proximal small bowel within the left hemi abdomen gradually taper to the level of the right lower quadrant, where these bowel loops appear to tether to the uterus in the right adnexa.   GI Surgeries / Procedures:  11/23 Exploratory laparotomy   Central access:  PICC double lumen 03/22/22  TPN start date:  03/26/22  Nutritional Goals: Goal TPN rate is 65 mL/hr (provides 61 g of protein and 2300 kcals per day)  RD Assessment: Estimated Needs Total Energy Estimated Needs: 2100-2300 Total Protein Estimated Needs: 115-130 grams Total Fluid Estimated Needs: > 2 L Kcal:  2100-2300   Protein:  115-130 grams   Fluid:  > 2 L  Current Nutrition:  NPO  Plan:  Continue TPN at 25 mL/hr at 1800(Day 2/3) Electrolytes in TPN: Na 26mq/L, K 550m/L, Ca 28m64mL, Mg 28mE42m, and Phos 128mm30m. Cl:Ac 1:1 High refeeding risk, CKD4 with renal function  at baseline. Phos stable at 2.5. Add standard MVI and trace elements to TPN Initiate Sensitive q8h SSI and adjust as needed  Change MIVF to NS '@50'$  mL/hr  Monitor TPN labs on Mon/Thurs   CarolWynelle Cleveland8/2023,9:09 AM

## 2022-03-27 NOTE — Assessment & Plan Note (Signed)
Remained stable

## 2022-03-27 NOTE — Progress Notes (Signed)
Progress Note   Patient: Leslie Houston LOV:[id number] DOB: April 28, 1951 DOA: 03/14/2022     13 DOS: the patient was seen and examined on 03/27/2022   Brief hospital course: Leslie Houston is a 71 y.o. 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  She presented to the ED via EMS on 03/14/2022 for evaluation of severe abdominal pain with associated with nausea and emesis.   Patient was found to have partial SBO vs ileus. She was started on empiric IV antibiotics due to concern for UTI.    Urology and General Surgery are consulted.   11/22: CT abdomen/pelvis with p.o. contrast today per surgery 11/23: CT - small bowel obstruction.  Exploratory laparotomy and lysis of additions 11/24: Postop day 1, attending service changed to surgery. 11/25: NG tube in place 11/26: Patient pulled out NG tube last night and surgical team recommends to leave it out as patient refused 11/27: Patient refusing NG tube, starting TPN 11/28: NG tube reinserted.  KUB shows persistent ileus    Assessment and Plan: * Small bowel obstruction ([place]) Presented with diffuse abdominal pain, N/V and constipation.  CT scan of abdomen and pelvis showed a few dilated loops of proximal small bowel within the left hemi abdomen gradually taper to the level of the right lower quadrant, where these bowel loops appear to tether to the uterus in the right adnexa.  Findings are suggestive of ileus versus partial small bowel obstruction.  11/18 >> 21 --patient continues to have recurrent obstructive symptoms when resumed on any p.o. intake, refuses to have NG tube replaced 11/22 - patient vomited 300 cc total of  yellow/green material this morning.  CT scan showed High-grade mechanical small bowel obstruction, Potential transition point in the mid abdominal central mesentery 11/23 -underwent exploratory laparotomy lysis of adhesions by Dr. Lysle Pearl 11/24: Postop recovery 11/25: Patient pulled out NG tube 11/26: Waiting for bowel function return 11/27: Started TPN 11/28: NG tube reinserted, KUB shows persistent ileus  UTI (urinary tract infection)-resolved as of 03/27/2022 Versus abnormal UA due to colonization and recent instrumentation.  UTI ruled out  Hypernatremia On D5W.  Sodium 145  Obesity (BMI 30-39.9) BMI 88.41 Complicates overall prognosis and care Lifestyle modification and exercise has been discussed with patient.  Urinary retention Status post recent suprapubic and ureteral catheterization on 11/13 with Dr. Erlene Quan. Plan was to leave ureteral Foley in for 1 week to allow urethral healing with a patent tract.  --Foley removed by Dr. Erlene Quan 11/20 --Suprapubic catheter to remain in place --Outpatient urology follow up as scheduled -Continue Flomax  11/21: Suprapubic catheter leaking around the tube, soaking dressings, gown and linens.  Urology do not think a bowel injury is likely. Per Dr. Erlene Quan, this may be normal and related to bladder spasms.  Most important thing is urine draining through the tube itself.  Seizures ([place]) Continue IV Keppra until taking PO. Seizure precautions.  CKD (chronic kidney disease) stage 4, GFR 15-29 ml/min (HCC) Remained stable  Partial intestinal obstruction (HCC)-resolved as of 03/22/2022 See partial SBO and surgery's recommendations  Hemiparesis affecting right side as late effect of cerebrovascular accident (CVA) (HCC)-resolved as of 03/22/2022 History of prior CVA with left-sided hemiparesis Resume aspirin once patient is able to tolerate p.o.  Subjective: NG tube reinserted  Physical Exam: Vitals:   03/26/22 1523 03/26/22  2027 03/27/22 0514 03/27/22 0758  BP: (!) 154/53 (!) 135/58 (!) 139/53 (!) 149/65  Pulse: (!) 56 (!) 59 (!) 57 (!) 54  Resp: '16 16 18 19  '$ Temp: 98 F (36.7 C) (!) 97.5 F (36.4 C) 98.1 F (36.7 C)   TempSrc:  Oral Oral   SpO2: 95% 97% 93% 98%  Weight:      Height:       71 year old female sitting in the bed comfortably without acute distress Lungs clear to auscultation bilaterally Cardiovascular regular rate and rhythm Abdomen soft, nontender, no mass or organomegaly.  Staples in place, showing no signs of infection GU: Suprapubic catheter in place Neuro alert and awake, at baseline Psych normal mood and affect Data Reviewed:  There are no new results to review at this time.  Family Communication: None  Disposition: Status is: Inpatient Remains inpatient appropriate because: Management of SBO  Planned Discharge Destination: Home   DVT prophylaxis-SCDs Time spent: 35 minutes  Author: Max Sane, MD 03/27/2022 1:01 PM  For on call review [email or web address].

## 2022-03-27 NOTE — Assessment & Plan Note (Signed)
Versus abnormal UA due to colonization and recent instrumentation.  UTI ruled out

## 2022-03-27 NOTE — Assessment & Plan Note (Signed)
BMI 31.59 Complicates overall prognosis and care Lifestyle modification and exercise has been discussed with patient.

## 2022-03-27 NOTE — Care Management Important Message (Signed)
Important Message  Patient Details  Name: Leslie Houston MRN: [id number] Date of Birth: Sep 22, 1950   Medicare Important Message Given:  Yes     [name] 03/27/2022, 11:57 AM

## 2022-03-27 NOTE — Assessment & Plan Note (Signed)
Status post recent suprapubic and ureteral catheterization on 11/13 with Dr. Erlene Quan. Plan was to leave ureteral Foley in for 1 week to allow urethral healing with a patent tract.  --Foley removed by Dr. Erlene Quan 11/20 --Suprapubic catheter to remain in place --Outpatient urology follow up as scheduled -Continue Flomax  11/21: Suprapubic catheter leaking around the tube, soaking dressings, gown and linens.  Urology do not think a bowel injury is likely. Per Dr. Erlene Quan, this may be normal and related to bladder spasms.  Most important thing is urine draining through the tube itself.

## 2022-03-28 ENCOUNTER — Inpatient Hospital Stay: Payer: Medicare Other

## 2022-03-28 DIAGNOSIS — R1084 Generalized abdominal pain: Secondary | ICD-10-CM | POA: Diagnosis not present

## 2022-03-28 DIAGNOSIS — R339 Retention of urine, unspecified: Secondary | ICD-10-CM | POA: Diagnosis not present

## 2022-03-28 DIAGNOSIS — K56609 Unspecified intestinal obstruction, unspecified as to partial versus complete obstruction: Secondary | ICD-10-CM | POA: Diagnosis not present

## 2022-03-28 DIAGNOSIS — I69351 Hemiplegia and hemiparesis following cerebral infarction affecting right dominant side: Secondary | ICD-10-CM | POA: Diagnosis not present

## 2022-03-28 DIAGNOSIS — N184 Chronic kidney disease, stage 4 (severe): Secondary | ICD-10-CM | POA: Diagnosis not present

## 2022-03-28 DIAGNOSIS — K5669 Other partial intestinal obstruction: Secondary | ICD-10-CM | POA: Diagnosis not present

## 2022-03-28 LAB — GLUCOSE, CAPILLARY
Glucose-Capillary: 76 mg/dL (ref 70–99)
Glucose-Capillary: 82 mg/dL (ref 70–99)
Glucose-Capillary: 89 mg/dL (ref 70–99)
Glucose-Capillary: 89 mg/dL (ref 70–99)
Glucose-Capillary: 90 mg/dL (ref 70–99)

## 2022-03-28 MED ORDER — IOHEXOL 9 MG/ML PO SOLN
500.0000 mL | ORAL | Status: AC
  Administered 2022-03-28 (×2): 500 mL via ORAL

## 2022-03-28 MED ORDER — TRAVASOL 10 % IV SOLN
INTRAVENOUS | Status: AC
  Filled 2022-03-28: qty 732

## 2022-03-28 NOTE — Progress Notes (Signed)
Subjective:  CC: Leslie Houston is a 71 y.o. female  Hospital stay day 14, 6 Days Post-Op partial SBO versus ileus  HPI: Reported bowel movement last night.  Patient feeling well.  ROS:  General: Denies weight loss, weight gain, fatigue, fevers, chills, and night sweats. Heart: Denies chest pain, palpitations, racing heart, irregular heartbeat, leg pain or swelling, and decreased activity tolerance. Respiratory: Denies breathing difficulty, shortness of breath, wheezing, cough, and sputum. GI: Denies change in appetite, heartburn, nausea, vomiting, constipation, diarrhea, and blood in stool. GU: Denies difficulty urinating, pain with urinating, urgency, frequency, blood in urine.   Objective:   Temp:  [97.5 F (36.4 C)-98.4 F (36.9 C)] 97.5 F (36.4 C) (11/29 0824) Pulse Rate:  [52-55] 54 (11/29 0824) Resp:  [16-22] 22 (11/29 0824) BP: (135-162)/(53-61) 156/61 (11/29 0824) SpO2:  [97 %-99 %] 98 % (11/29 0410) Weight:  [88.2 kg] 88.2 kg (11/29 0120)     Height: '5\' 11"'$  (180.3 cm) Weight: 88.2 kg BMI (Calculated): 27.13   Intake/Output this shift:   Intake/Output Summary (Last 24 hours) at 03/28/2022 1214 Last data filed at 03/28/2022 0831 Gross per 24 hour  Intake 1636.33 ml  Output 1875 ml  Net -238.67 ml    Constitutional :  alert, cooperative, appears stated age, and no distress  Respiratory:  clear to auscultation bilaterally  Cardiovascular:  regular rate and rhythm  Gastrointestinal: soft, non-tender; bowel sounds normal; no masses,  no organomegaly.  tympany and distention still present  Skin: Cool and moist. Staple line c/d/i  Psychiatric: Normal affect, non-agitated, not confused       LABS:     Latest Ref Rng & Units 03/27/2022    5:18 AM 03/26/2022    4:15 AM 03/25/2022    5:00 AM  CMP  Glucose 70 - 99 mg/dL 108  102  80   BUN 8 - 23 mg/dL '24  22  25   '$ Creatinine 0.44 - 1.00 mg/dL 2.22  2.23  2.44   Sodium 135 - 145 mmol/L 145  149  145   Potassium  3.5 - 5.1 mmol/L 3.6  3.7  3.3   Chloride 98 - 111 mmol/L 125  127  121   CO2 22 - 32 mmol/L '17  18  18   '$ Calcium 8.9 - 10.3 mg/dL 8.6  8.8  8.5   Total Protein 6.5 - 8.1 g/dL 5.7     Total Bilirubin 0.3 - 1.2 mg/dL 0.7     Alkaline Phos 38 - 126 U/L 114     AST 15 - 41 U/L 29     ALT 0 - 44 U/L 20         Latest Ref Rng & Units 03/27/2022    5:18 AM 03/25/2022    5:00 AM 03/24/2022   11:34 AM  CBC  WBC 4.0 - 10.5 K/uL 6.7  8.8  8.9   Hemoglobin 12.0 - 15.0 g/dL 9.7  10.4  10.9   Hematocrit 36.0 - 46.0 % 30.7  32.6  33.3   Platelets 150 - 400 K/uL 185  226  221     RADS:  Assessment:   S/p ex lap, lysis of adhesions.  Now with postop ileus.     NG back in place, light gastric output noted.  Having bowel movements again but KUB still shows dilated loops of bowel.  Will repeat CT abdomen pelvis for further evaluation  labs/images/medications/previous chart entries reviewed personally and relevant changes/updates noted above.

## 2022-03-28 NOTE — Progress Notes (Signed)
Occupational Therapy Treatment Patient Details Name: Leslie Houston MRN: [id number] DOB: [birth year] Today's Date: 03/28/2022   History of present illness Pt is a 71 y/o F admitted on 03/14/22 after presenting to the ED with c/o severe abdominal pain associated with nausea & emesis. Pt was found to have partial SBO vs ileus. Pt is s/p ex lap on 11/23. PMH: DM, CKD IV, depression, CVA with hearing loss, CAD, anxiety, s/p recent suprapubic catheter placement for chronic urinary retention & urethral dilation for stricture 03/12/22   OT comments  Chart reviewed, pt greeted in bed agreeable to OT tx. Of note, pt s/p ex lap 11/23, continue on transfer orders present in the chart. Goals remain appropriate. Tx session targeted progressing functional activity tolerance and ADL independence. Progress is noted in bed mobility with pt able to complete with supervision. STS completed with CGA, short ambulatory transfer to bedside chair with CGA-MIN A, no AD. Grooming tasks completed with SET UP. Education provided throughout re: Economist for optimal mobility/ADL completion, importance of continued participation for progression of mobility. Pt is left in bedside chair, all needs met. All lines/leads/NG to suction/catheter intact. OT will continue to follow acutely.    Recommendations for follow up therapy are one component of a multi-disciplinary discharge planning process, led by the attending physician.  Recommendations may be updated based on patient status, additional functional criteria and insurance authorization.    Follow Up Recommendations  Home health OT     Assistance Recommended at Discharge Intermittent Supervision/Assistance  Patient can return home with the following  Assistance with cooking/housework;A little help with bathing/dressing/bathroom   Equipment Recommendations  Tub/shower bench    Recommendations for Other Services      Precautions / Restrictions  Precautions Precautions: Fall Precaution Comments: suprapubic cath Restrictions Weight Bearing Restrictions: No       Mobility Bed Mobility Overal bed mobility: Needs Assistance Bed Mobility: Sidelying to Sit, Rolling Rolling: Supervision Sidelying to sit: Supervision            Transfers Overall transfer level: Needs assistance Equipment used: None Transfers: Sit to/from Stand, Bed to chair/wheelchair/BSC Sit to Stand: Min guard     Step pivot transfers: Min guard, Min assist           Balance Overall balance assessment: Needs assistance Sitting-balance support: Feet supported, Bilateral upper extremity supported Sitting balance-Leahy Scale: Good     Standing balance support: During functional activity, Bilateral upper extremity supported, Reliant on assistive device for balance Standing balance-Leahy Scale: Fair                             ADL either performed or assessed with clinical judgement   ADL Overall ADL's : Needs assistance/impaired     Grooming: Wash/dry face;Wash/dry hands;Sitting;Set up                   Toilet Transfer: Min Psychiatric nurse Details (indicate cue type and reason): simulated to bedside chair, short ambulatory transfer                Extremity/Trunk Assessment              Vision       Perception     Praxis      Cognition Arousal/Alertness: Awake/alert Behavior During Therapy: WFL for tasks assessed/performed Overall Cognitive Status: Within Functional Limits for tasks assessed Area of Impairment: Problem solving  Problem Solving: Requires verbal cues          Exercises      Shoulder Instructions       General Comments      Pertinent Vitals/ Pain       Pain Assessment Pain Assessment: No/denies pain  Home Living                                          Prior Functioning/Environment               Frequency  Min 2X/week        Progress Toward Goals  OT Goals(current goals can now be found in the care plan section)  Progress towards OT goals: Progressing toward goals     Plan Frequency remains appropriate    Co-evaluation                 AM-PAC OT "6 Clicks" Daily Activity     Outcome Measure   Help from another person eating meals?: None Help from another person taking care of personal grooming?: None Help from another person toileting, which includes using toliet, bedpan, or urinal?: A Little Help from another person bathing (including washing, rinsing, drying)?: A Little Help from another person to put on and taking off regular upper body clothing?: None Help from another person to put on and taking off regular lower body clothing?: A Little 6 Click Score: 21    End of Session Equipment Utilized During Treatment: Rolling walker (2 wheels)  OT Visit Diagnosis: Unsteadiness on feet (R26.81)   Activity Tolerance Patient tolerated treatment well   Patient Left in chair;with chair alarm set;with call bell/phone within reach (RN entering room)   Nurse Communication Mobility status        Time: 1537-9432 OT Time Calculation (min): 24 min  Charges: OT General Charges $OT Visit: 1 Visit OT Treatments $Self Care/Home Management : 8-22 mins $Therapeutic Activity: 8-22 mins  Shanon Payor, OTD OTR/L  03/28/22, 1:23 PM

## 2022-03-28 NOTE — Progress Notes (Signed)
Physical Therapy Treatment Patient Details Name: Leslie Houston MRN: [id number] DOB: 11/09/1950 Today's Date: 03/28/2022   History of Present Illness Pt is a 70 y/o F admitted on 03/14/22 after presenting to the ED with c/o severe abdominal pain associated with nausea & emesis. Pt was found to have partial SBO vs ileus. Pt is s/p ex lap on 11/23. PMH: DM, CKD IV, depression, CVA with hearing loss, CAD, anxiety, s/p recent suprapubic catheter placement for chronic urinary retention & urethral dilation for stricture 03/12/22    PT Comments    Patient agreeable to PT tx session. NGT clamped at time of session. With max encouragement, patient ambulatory in hallway with RW and supervision. Patient fearful of NGT coming out with mobility despite reassurance of stability of NGT. Encouraged continued mobility to improve strength and activity tolerance. D/c plan remains appropriate.    Recommendations for follow up therapy are one component of a multi-disciplinary discharge planning process, led by the attending physician.  Recommendations may be updated based on patient status, additional functional criteria and insurance authorization.  Follow Up Recommendations  Home health PT     Assistance Recommended at Discharge Intermittent Supervision/Assistance  Patient can return home with the following A little help with walking and/or transfers;A little help with bathing/dressing/bathroom;Assistance with cooking/housework;Help with stairs or ramp for entrance   Equipment Recommendations  Rolling Jimmy Plessinger (2 wheels)    Recommendations for Other Services       Precautions / Restrictions Precautions Precautions: Fall Precaution Comments: suprapubic cath Restrictions Weight Bearing Restrictions: No     Mobility  Bed Mobility               General bed mobility comments: OOB in recliner    Transfers Overall transfer level: Needs assistance Equipment used: Rolling Tylee Yum (2  wheels) Transfers: Sit to/from Stand Sit to Stand: Min guard           General transfer comment: min guard for safety    Ambulation/Gait Ambulation/Gait assistance: Supervision Gait Distance (Feet): 200 Feet Assistive device: Rolling Muaz Shorey (2 wheels) Gait Pattern/deviations: Step-through pattern, Decreased stride length Gait velocity: decreased     General Gait Details: cues for close proximity to RW and upright posture   Stairs             Wheelchair Mobility    Modified Rankin (Stroke Patients Only)       Balance Overall balance assessment: Needs assistance Sitting-balance support: Feet supported, Bilateral upper extremity supported Sitting balance-Leahy Scale: Good     Standing balance support: During functional activity, Bilateral upper extremity supported, Reliant on assistive device for balance Standing balance-Leahy Scale: Fair                              Cognition Arousal/Alertness: Awake/alert Behavior During Therapy: WFL for tasks assessed/performed Overall Cognitive Status: Within Functional Limits for tasks assessed                                          Exercises      General Comments        Pertinent Vitals/Pain Pain Assessment Pain Assessment: No/denies pain    Home Living                          Prior Function  PT Goals (current goals can now be found in the care plan section) Acute Rehab PT Goals PT Goal Formulation: With patient Time For Goal Achievement: 04/08/22 Potential to Achieve Goals: Good Progress towards PT goals: Progressing toward goals    Frequency    Min 2X/week      PT Plan Current plan remains appropriate    Co-evaluation              AM-PAC PT "6 Clicks" Mobility   Outcome Measure  Help needed turning from your back to your side while in a flat bed without using bedrails?: A Little Help needed moving from lying on your back to  sitting on the side of a flat bed without using bedrails?: A Little Help needed moving to and from a bed to a chair (including a wheelchair)?: A Little Help needed standing up from a chair using your arms (e.g., wheelchair or bedside chair)?: A Little Help needed to walk in hospital room?: A Little Help needed climbing 3-5 steps with a railing? : A Little 6 Click Score: 18    End of Session   Activity Tolerance: Patient tolerated treatment well Patient left: in chair;with chair alarm set;with call bell/phone within reach Nurse Communication: Mobility status PT Visit Diagnosis: Difficulty in walking, not elsewhere classified (R26.2);Unsteadiness on feet (R26.81)     Time: 1359-1416 PT Time Calculation (min) (ACUTE ONLY): 17 min  Charges:  $Therapeutic Activity: 8-22 mins                     Tishanna Dunford A. Gilford Rile PT, DPT [facility] - Acute Rehabilitation Services    Renwick Asman A Onix Jumper 03/28/2022, 3:58 PM

## 2022-03-28 NOTE — Progress Notes (Signed)
Progress Note   Patient: Leslie Houston BDZ:[id number] DOB: 07/22/1950 DOA: 03/14/2022     14 DOS: the patient was seen and examined on 03/28/2022   Brief hospital course: Leslie Houston is a 71 y.o. 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  She presented to the ED via EMS on 03/14/2022 for evaluation of severe abdominal pain with associated with nausea and emesis.   Patient was found to have partial SBO vs ileus. She was started on empiric IV antibiotics due to concern for UTI.    Urology and General Surgery are consulted.   11/22: CT abdomen/pelvis with p.o. contrast today per surgery 11/23: CT - small bowel obstruction.  Exploratory laparotomy and lysis of additions 11/24: Postop Leslie 1, attending service changed to surgery. 11/25: NG tube in place 11/26: Patient pulled out NG tube last night and surgical team recommends to leave it out as patient refused 11/27: Patient refusing NG tube, starting TPN 11/28: NG tube reinserted.  KUB shows persistent ileus 11/29: NG remains in place.  Surgery repeating CT scan.    Assessment and Plan: * Small bowel obstruction ([place]) Presented with diffuse abdominal pain, N/V and constipation.  CT scan of abdomen and pelvis showed a few dilated loops of proximal small bowel within the left hemi abdomen gradually taper to the level of the right lower quadrant, where these bowel loops appear to tether to the uterus in the right adnexa.  Findings are suggestive of ileus versus partial small bowel obstruction.  11/18 >> 21 --patient continues to have recurrent obstructive symptoms when resumed on any p.o. intake, refuses to have NG tube  replaced 11/22 - patient vomited 300 cc total of yellow/green material this morning.  CT scan showed High-grade mechanical small bowel obstruction, Potential transition point in the mid abdominal central mesentery 11/23 -underwent exploratory laparotomy lysis of adhesions by Dr. Lysle Pearl 11/24: Postop recovery 11/25: Patient pulled out NG tube 11/26: Waiting for bowel function return 11/27: Started TPN 11/28: NG tube reinserted, KUB shows persistent ileus  UTI (urinary tract infection)-resolved as of 03/27/2022 Versus abnormal UA due to colonization and recent instrumentation.  UTI ruled out  Hypernatremia On D5W.  Sodium 145  Obesity (BMI 30-39.9) BMI 34.19 Complicates overall prognosis and care Lifestyle modification and exercise has been discussed with patient.  Urinary retention Status post recent suprapubic and ureteral catheterization on 11/13 with Dr. Erlene Quan. Plan was to leave ureteral Foley in for 1 week to allow urethral healing with a patent tract.  --Foley removed by Dr. Erlene Quan 11/20 --Suprapubic catheter to remain in place --Outpatient urology follow up as scheduled -Continue Flomax  11/21: Suprapubic catheter leaking around the tube, soaking dressings, gown and linens.  Urology do not think a bowel injury is likely. Per Dr. Erlene Quan, this may be normal and related to bladder spasms.  Most important thing is urine draining through the tube itself.  Seizures ([place]) Continue IV Keppra until taking PO. Seizure precautions.  CKD (chronic kidney disease) stage 4, GFR 15-29 ml/min (HCC) Remained stable  Partial intestinal obstruction (HCC)-resolved as of 03/22/2022 See partial SBO and surgery's recommendations  Hemiparesis affecting right side as late effect of cerebrovascular accident (CVA) (HCC)-resolved as of 03/22/2022 History of prior CVA with left-sided hemiparesis Resume aspirin once  patient is able to tolerate p.o.        Subjective: Pt reports feeling  okay today, just frustrated by persistent obstruction and needing NG tube replaced yesterday. No other acute complaints or acute events reported.   Physical Exam: Vitals:   03/27/22 1951 03/28/22 0120 03/28/22 0410 03/28/22 0824  BP: (!) 152/55  (!) 135/53 (!) 156/61  Pulse: (!) 54  (!) 52 (!) 54  Resp: 18  20 (!) 22  Temp: 98.2 F (36.8 C)  98.4 F (36.9 C) (!) 97.5 F (36.4 C)  TempSrc: Oral  Oral Oral  SpO2: 97%  98%   Weight:  88.2 kg    Height:       General exam: awake, alert, no acute distress HEENT: NG tube in place, to wall suction, moist mucus membranes, hearing grossly normal  Respiratory system: CTAB diminished bases, normal respiratory effort. Cardiovascular system: normal S1/S2, RRR, no pedal edema.   Gastrointestinal system: midline incision with staples intact, distended. Central nervous system: A&O x3. no gross focal neurologic deficits, normal speech Extremities: moves all, SCD's on BLE's Skin: dry, intact, normal temperature Psychiatry: normal mood, congruent affect, judgement and insight appear normal    Data Reviewed:  There are no new results to review at this time.  Family Communication: None   Disposition: Status is: Inpatient Remains inpatient appropriate because: Persistent SBO, NG tube remains in place.   Discharge will be per general surgery, primary service.    Planned Discharge Destination: Home   DVT prophylaxis-SCDs   Time spent: 35 minutes  Author: Ezekiel Slocumb, DO 03/28/2022 12:57 PM  For on call review [email or web address].

## 2022-03-28 NOTE — Progress Notes (Signed)
Nutrition Follow-up  DOCUMENTATION CODES:   Not applicable  INTERVENTION:   -TPN management per pharmacy -Monitor Mg, K, and Phos daily and replete as needed  due to high refeeding risk -RD to monitor for diet advancement and adjust supplement regimen as appropriate  NUTRITION DIAGNOSIS:   Increased nutrient needs related to post-op healing as evidenced by estimated needs.  Ongoing  GOAL:   Patient will meet greater than or equal to 90% of their needs  Progressing   MONITOR:   Diet advancement  REASON FOR ASSESSMENT:   Consult New TPN/TNA  ASSESSMENT:   Ptm with medical history significant for diabetes mellitus with complications of stage IV chronic kidney disease, depression, history of prior CVA with residual hearing loss, coronary artery disease, anxiety disorder, status post recent suprapubic catheter placement for chronic urinary retention and urethral dilation for stricture with urology on 11/13.  She presented for evaluation of severe abdominal pain with associated with nausea and emesis.  11/23- s/p ex lap with LOA 11/25- NGT placed 11/26- NT pulled out by pt 11/27- refusing NGT replacement, starting TPN today 11/28- NGT reinserted  Reviewed I/O's: +1 L x 24 hours and +4.2 L since admission  UOP: 500 ml x 24 hours  NGT output: 100 ml x 24 hours   Per surgery notes, NGT replaced due to decompression and KUB reveals continues ileus and obstruction.   Case discussed with pharmacist; TPN was kept at 25 ml/hr due to electrolyte imbalance, but plan to advance today. Pt receiving TPN at 25 ml/hr, which provides 530 kcals and 37 grams protein, meeting 25% of estimated kcal needs and 32% of estimated protein needs. Electrolytes WDL and plan to advance TPN today. Plan to increase TPN to 50 ml/hr at 1800, which provides 1061 kcals and 73 grams protein, meeting 51% of estimated kcal needs and 63% of estimated protein needs. May advance to goal rate at 03/29/22.    Medications reviewed and include dextrose 5% solution @ 50 ml/hr.   Labs reviewed: CBGS: 76 (inpatient orders for glycemic control are 0-9 units insulin aspart every 4 hours).    Diet Order:   Diet Order             Diet NPO time specified Except for: Sips with Meds  Diet effective now                   EDUCATION NEEDS:   Education needs have been addressed  Skin:  Skin Assessment: Skin Integrity Issues: Skin Integrity Issues:: Incisions Incisions: closed abdomen and perineum  Last BM:  03/27/22  Height:   Ht Readings from Last 1 Encounters:  03/14/22 '5\' 11"'$  (1.803 m)    Weight:   Wt Readings from Last 1 Encounters:  03/28/22 88.2 kg    Ideal Body Weight:  70.5 kg  BMI:  Body mass index is 27.12 kg/m.  Estimated Nutritional Needs:   Kcal:  2100-2300  Protein:  115-130 grams  Fluid:  > 2 L    Loistine Chance, RD, LDN, [place] Registered Dietitian II Certified Diabetes Care and Education Specialist Please refer to [facility] for RD and/or RD on-call/weekend/after hours pager

## 2022-03-28 NOTE — Progress Notes (Signed)
PHARMACY - TOTAL PARENTERAL NUTRITION CONSULT NOTE   Indication: Prolonged ileus  Patient Measurements: Height: '5\' 11"'$  (180.3 cm) Weight: 88.2 kg (194 lb 7.1 oz) IBW/kg (Calculated) : 70.8   Body mass index is 27.12 kg/m. Usual Weight: 86.8 kg  Assessment:  71 year old female admitted with small bowel obstruction s/p lysis of adhesions. Past medical history includes diabetes mellitus (A1c 4.7% on 12/05/21) CKD4, depression, history of prior CVA, CAD, anxiety. On 11/25, patient pulled out NG tube. Starting TPN for prolonged ileus.  Glucose / Insulin:  CBGs 80-100 without sliding scale  Electrolytes:  Hypernatremia improving Na+ 149 > 145 D5-NS @ 100 mL/hr through 11/27 PM D5 @ 100 mL/hr started 11/27 PM  K+ improved. 11/26 K+3.3 (given Kcl PO 40 mEq) > 11/27 K+3.7   Renal: Scr 2.23 at baseline  Hepatic: 11/15 AST/ALT: 16/9, Tbili 0.6  Intake / Output; MIVF:  MIVF D5-NS @ 100 mL/hr > D5W '@100'$   I&O +7.8L Intake: +12L from intravenous source Output: -4.650L (urine)  GI Imaging: --CT scan of abdomen and pelvis: few dilated loops of proximal small bowel within the left hemi abdomen gradually taper to the level of the right lower quadrant, where these bowel loops appear to tether to the uterus in the right adnexa.   GI Surgeries / Procedures:  11/23 Exploratory laparotomy   Central access:  PICC double lumen 03/22/22  TPN start date:  03/26/22  Nutritional Goals: Goal TPN rate is 65 mL/hr (provides 61 g of protein and 2300 kcals per day)  RD Assessment: Estimated Needs Total Energy Estimated Needs: 2100-2300 Total Protein Estimated Needs: 115-130 grams Total Fluid Estimated Needs: > 2 L Kcal:  2100-2300   Protein:  115-130 grams   Fluid:  > 2 L  Current Nutrition:  NPO  Plan:  Advance TPN to 50 mL/hr at 1800. Consider advancing to goal 11/30. Electrolytes in TPN: Na 41mq/L, K 519m/L, Ca 81m4mL, Mg 81mE781m, and Phos 181mm481m. Cl:Ac 1:1 High refeeding risk,  CKD4 with renal function at baseline. Phos stable at 2.5. Add standard MVI and trace elements to TPN Initiate Sensitive q8h SSI and adjust as needed  Continue MIVF D5 '@50'$  mL/hr. Expect increase in blood sugars with increased TPN rate. Monitor TPN labs on Mon/Thurs   CarolWynelle Cleveland9/2023,8:47 AM

## 2022-03-29 DIAGNOSIS — R1084 Generalized abdominal pain: Secondary | ICD-10-CM | POA: Diagnosis not present

## 2022-03-29 DIAGNOSIS — K56609 Unspecified intestinal obstruction, unspecified as to partial versus complete obstruction: Secondary | ICD-10-CM | POA: Diagnosis not present

## 2022-03-29 DIAGNOSIS — K5669 Other partial intestinal obstruction: Secondary | ICD-10-CM | POA: Diagnosis not present

## 2022-03-29 LAB — COMPREHENSIVE METABOLIC PANEL
ALT: 15 U/L (ref 0–44)
AST: 16 U/L (ref 15–41)
Albumin: 2.3 g/dL — ABNORMAL LOW (ref 3.5–5.0)
Alkaline Phosphatase: 95 U/L (ref 38–126)
Anion gap: 3 — ABNORMAL LOW (ref 5–15)
BUN: 33 mg/dL — ABNORMAL HIGH (ref 8–23)
CO2: 19 mmol/L — ABNORMAL LOW (ref 22–32)
Calcium: 8.4 mg/dL — ABNORMAL LOW (ref 8.9–10.3)
Chloride: 119 mmol/L — ABNORMAL HIGH (ref 98–111)
Creatinine, Ser: 2.23 mg/dL — ABNORMAL HIGH (ref 0.44–1.00)
GFR, Estimated: 23 mL/min — ABNORMAL LOW (ref >60)
Glucose, Bld: 90 mg/dL (ref 70–99)
Potassium: 3.6 mmol/L (ref 3.5–5.1)
Sodium: 141 mmol/L (ref 135–145)
Total Bilirubin: 0.9 mg/dL (ref 0.3–1.2)
Total Protein: 5.7 g/dL — ABNORMAL LOW (ref 6.5–8.1)

## 2022-03-29 LAB — GLUCOSE, CAPILLARY
Glucose-Capillary: 100 mg/dL — ABNORMAL HIGH (ref 70–99)
Glucose-Capillary: 101 mg/dL — ABNORMAL HIGH (ref 70–99)
Glucose-Capillary: 107 mg/dL — ABNORMAL HIGH (ref 70–99)
Glucose-Capillary: 89 mg/dL (ref 70–99)
Glucose-Capillary: 93 mg/dL (ref 70–99)
Glucose-Capillary: 97 mg/dL (ref 70–99)

## 2022-03-29 LAB — MAGNESIUM: Magnesium: 2 mg/dL (ref 1.7–2.4)

## 2022-03-29 LAB — PHOSPHORUS: Phosphorus: 3.3 mg/dL (ref 2.5–4.6)

## 2022-03-29 LAB — TRIGLYCERIDES: Triglycerides: 126 mg/dL (ref <150)

## 2022-03-29 MED ORDER — STERILE WATER FOR INJECTION IJ SOLN
INTRAMUSCULAR | Status: AC
  Administered 2022-03-29: 10 mL
  Filled 2022-03-29: qty 10

## 2022-03-29 MED ORDER — SENNOSIDES-DOCUSATE SODIUM 8.6-50 MG PO TABS
1.0000 | ORAL_TABLET | Freq: Two times a day (BID) | ORAL | Status: DC
  Administered 2022-03-29 – 2022-04-03 (×12): 1 via ORAL
  Filled 2022-03-29 (×13): qty 1

## 2022-03-29 MED ORDER — SIMETHICONE 80 MG PO CHEW
80.0000 mg | CHEWABLE_TABLET | Freq: Four times a day (QID) | ORAL | Status: DC
  Administered 2022-03-29 – 2022-04-04 (×27): 80 mg via ORAL
  Filled 2022-03-29 (×29): qty 1

## 2022-03-29 MED ORDER — POLYETHYLENE GLYCOL 3350 17 G PO PACK
17.0000 g | PACK | Freq: Every day | ORAL | Status: DC
  Administered 2022-03-29: 17 g via ORAL
  Filled 2022-03-29: qty 1

## 2022-03-29 MED ORDER — TRAVASOL 10 % IV SOLN
INTRAVENOUS | Status: AC
  Filled 2022-03-29: qty 842.4

## 2022-03-29 NOTE — Progress Notes (Signed)
Physical Therapy Treatment Patient Details Name: Leslie Houston MRN: [id number] DOB: 09/19/1950 Today's Date: 03/29/2022   History of Present Illness Pt is a 71 y/o F admitted on 03/14/22 after presenting to the ED with c/o severe abdominal pain associated with nausea & emesis. Pt was found to have partial SBO vs ileus. Pt is s/p ex lap on 11/23. PMH: DM, CKD IV, depression, CVA with hearing loss, CAD, anxiety, s/p recent suprapubic catheter placement for chronic urinary retention & urethral dilation for stricture 03/12/22    PT Comments    Patient requires max encouragement to participate with therapy. Daughter present and supportive. Able to increase ambulation distance to 360' with supervision and RW. Fearful of NGT coming out, reassured and secured to gown prior to mobilizing. Encouraged continued mobility and increase time up out of bed. D/c plan remains appropriate.     Recommendations for follow up therapy are one component of a multi-disciplinary discharge planning process, led by the attending physician.  Recommendations may be updated based on patient status, additional functional criteria and insurance authorization.  Follow Up Recommendations  Home health PT     Assistance Recommended at Discharge Intermittent Supervision/Assistance  Patient can return home with the following A little help with walking and/or transfers;A little help with bathing/dressing/bathroom;Assistance with cooking/housework;Help with stairs or ramp for entrance   Equipment Recommendations  Rolling Shahil Speegle (2 wheels)    Recommendations for Other Services       Precautions / Restrictions Precautions Precautions: Fall Precaution Comments: suprapubic cath Restrictions Weight Bearing Restrictions: No     Mobility  Bed Mobility Overal bed mobility: Modified Independent             General bed mobility comments: able to complete without assist    Transfers Overall transfer level: Needs  assistance Equipment used: Rolling Wednesday Ericsson (2 wheels) Transfers: Sit to/from Stand Sit to Stand: Min guard           General transfer comment: min guard for safety    Ambulation/Gait Ambulation/Gait assistance: Supervision Gait Distance (Feet): 360 Feet Assistive device: Rolling Thedore Pickel (2 wheels) Gait Pattern/deviations: Step-through pattern, Decreased stride length Gait velocity: WFL     General Gait Details: cues for close proximity to RW. Supervision for safety. Speed [facility]   Stairs             Wheelchair Mobility    Modified Rankin (Stroke Patients Only)       Balance Overall balance assessment: Needs assistance Sitting-balance support: Feet supported, Bilateral upper extremity supported Sitting balance-Leahy Scale: Good     Standing balance support: During functional activity, Bilateral upper extremity supported, Reliant on assistive device for balance Standing balance-Leahy Scale: Fair                              Cognition Arousal/Alertness: Awake/alert Behavior During Therapy: WFL for tasks assessed/performed Overall Cognitive Status: Within Functional Limits for tasks assessed                                          Exercises      General Comments        Pertinent Vitals/Pain Pain Assessment Pain Assessment: Faces Faces Pain Scale: Hurts a little bit Pain Location: abdominal incision Pain Descriptors / Indicators: Sore Pain Intervention(s): Monitored during session    Home Living  Prior Function            PT Goals (current goals can now be found in the care plan section) Acute Rehab PT Goals Patient Stated Goal: decreased soreness PT Goal Formulation: With patient Time For Goal Achievement: 04/08/22 Potential to Achieve Goals: Good Progress towards PT goals: Progressing toward goals    Frequency    Min 2X/week      PT Plan Current plan remains  appropriate    Co-evaluation              AM-PAC PT "6 Clicks" Mobility   Outcome Measure  Help needed turning from your back to your side while in a flat bed without using bedrails?: A Little Help needed moving from lying on your back to sitting on the side of a flat bed without using bedrails?: A Little Help needed moving to and from a bed to a chair (including a wheelchair)?: A Little Help needed standing up from a chair using your arms (e.g., wheelchair or bedside chair)?: A Little Help needed to walk in hospital room?: A Little Help needed climbing 3-5 steps with a railing? : A Little 6 Click Score: 18    End of Session   Activity Tolerance: Patient tolerated treatment well Patient left: in bed;with call bell/phone within reach;with family/visitor present Nurse Communication: Mobility status PT Visit Diagnosis: Difficulty in walking, not elsewhere classified (R26.2);Unsteadiness on feet (R26.81)     Time: 6144-3154 PT Time Calculation (min) (ACUTE ONLY): 20 min  Charges:  $Therapeutic Activity: 8-22 mins                     Arbor Cohen A. Gilford Rile PT, DPT [facility] - Acute Rehabilitation Services    Yaakov Saindon A Lulla Linville 03/29/2022, 4:20 PM

## 2022-03-29 NOTE — Progress Notes (Signed)
Nutrition Follow-up  DOCUMENTATION CODES:   Not applicable  INTERVENTION:   -TPN management per pharmacy -Monitor Mg, K, and Phos daily and replete as needed  due to high refeeding risk -RD to monitor for diet advancement and adjust supplement regimen as appropriate   NUTRITION DIAGNOSIS:   Increased nutrient needs related to post-op healing as evidenced by estimated needs.  Ongoing  GOAL:   Patient will meet greater than or equal to 90% of their needs  Progressing   MONITOR:   Diet advancement  REASON FOR ASSESSMENT:   Consult New TPN/TNA  ASSESSMENT:   Ptm with medical history significant for diabetes mellitus with complications of stage IV chronic kidney disease, depression, history of prior CVA with residual hearing loss, coronary artery disease, anxiety disorder, status post recent suprapubic catheter placement for chronic urinary retention and urethral dilation for stricture with urology on 11/13.  She presented for evaluation of severe abdominal pain with associated with nausea and emesis.  11/23- s/p ex lap with LOA 11/25- NGT placed 11/26- NT pulled out by pt 11/27- refusing NGT replacement, starting TPN today 11/28- NGT reinserted  Reviewed I/O's: +1.3 L x 24 hours and +2.9 L since 03/15/22   UOP: 1.8 L x 24 hours  NGT output: 1.7 L x 24 hours  Case discussed with pharmacist. Electrolytes WDL. Plan to increase TPN to 65 ml/hr today, which provides 1679 kcals and 84 grams protein, meeting 80% of estimated kcal needs and 73% of estimated protein needs.   Medications reviewed and include miralax, senokot, keppra, and dextrose 5% solution @ 50 ml/hr.   Labs reviewed: CBGS: 89-97 (inpatient orders for glycemic control are 0-9 units insulin aspart every 4 hours).    Diet Order:   Diet Order             Diet NPO time specified Except for: Sips with Meds  Diet effective now                   EDUCATION NEEDS:   Education needs have been  addressed  Skin:  Skin Assessment: Skin Integrity Issues: Skin Integrity Issues:: Incisions Incisions: closed abdomen and perineum  Last BM:  03/27/22  Height:   Ht Readings from Last 1 Encounters:  03/14/22 '5\' 11"'$  (1.803 m)    Weight:   Wt Readings from Last 1 Encounters:  03/28/22 88.2 kg    Ideal Body Weight:  70.5 kg  BMI:  Body mass index is 27.12 kg/m.  Estimated Nutritional Needs:   Kcal:  2100-2300  Protein:  115-130 grams  Fluid:  > 2 L    Loistine Chance, RD, LDN, Vivian Registered Dietitian II Certified Diabetes Care and Education Specialist Please refer to [facility] for RD and/or RD on-call/weekend/after hours pager

## 2022-03-29 NOTE — Progress Notes (Signed)
Mobility Specialist - Progress Note   03/29/22 1200  Mobility  Activity Transferred from bed to chair  Level of Assistance Minimal assist, patient does 75% or more  Assistive Device None  Distance Ambulated (ft) 3 ft  Activity Response Tolerated well  $Mobility charge 1 Mobility     Pt lying in bed upon arrival, utilizing RA. Pt voiced mild pain at abdominal incision site. Agreeable to activity, but concerned about NGT coming out---ensured pt that NGT is secured for session. Pt declined RW and ambulation this date. Able to complete bed mobility, STS, and transfer with minA and no AD. Pt left in chair with alarm set, needs in reach.    Kathee Delton Mobility Specialist 03/29/22, 12:30 PM

## 2022-03-29 NOTE — Progress Notes (Addendum)
PHARMACY - TOTAL PARENTERAL NUTRITION CONSULT NOTE   Indication: Prolonged ileus  Patient Measurements: Height: '5\' 11"'$  (180.3 cm) Weight: 88.2 kg (194 lb 7.1 oz) IBW/kg (Calculated) : 70.8   Body mass index is 27.12 kg/m. Usual Weight: 86.8 kg  Assessment:  71 year old female admitted with small bowel obstruction s/p lysis of adhesions. Past medical history includes diabetes mellitus (A1c 4.7% on 12/05/21), CKD4, depression, history of prior CVA, CAD, anxiety. On 11/25, patient pulled out NG tube. Starting TPN for prolonged ileus.  Glucose / Insulin:  CBGs 80-100 without sliding scale (lowest BG 76)  Electrolytes:  Na+ 149 > 145 > 141.  K+ improved. 11/26 K+3.3 (given Kcl PO 40 mEq) > 11/27 K+3.7 Cl improving 125 > 119, CO2 improving 17 > 19 Phos 2.5 > 3.3  Renal: Scr 2.23 at baseline  Hepatic: 11/15 AST/ALT: 16/9, Tbili 0.6  Intake / Output; MIVF:  MIVF D5 @ 50 mL/hr I&O +1.08L Intake: +1.4L from IV  Output: -300 mL (urine), 100 mL (NG output)  GI Imaging: --CT scan of abdomen and pelvis: few dilated loops of proximal small bowel within the left hemi abdomen gradually taper to the level of the right lower quadrant, where these bowel loops appear to tether to the uterus in the right adnexa.   GI Surgeries / Procedures:  11/23 Exploratory laparotomy   Central access:  PICC double lumen 03/22/22  TPN start date:  03/26/22  Nutritional Goals: Goal TPN rate is 65 mL/hr (provides 54 g/L of protein and 2160 kcals per day)  RD Assessment: Estimated Needs Total Energy Estimated Needs: 2100-2300 Total Protein Estimated Needs: 115-130 grams Total Fluid Estimated Needs: > 2 L Kcal:  2100-2300 Protein:  115-130 grams Fluid:  > 2 L  Current Nutrition:  NPO  Plan:  --Advance TPN to 65 mL/hr at 1800 (goal rate) --Electrolytes in TPN: Na 9mq/L, K 51051m/L, Ca 51m30mL, Mg 51mE73m, and Phos 151mm102m. Cl:Ac 1:1 High refeeding risk, though phos is stable. CKD4 with renal  function at baseline.  Consider 1:2 if CO2/Chloride worsens tomorrow Increase Dextrose in TPN (15%) --Add standard MVI and trace elements to TPN --Initiate Sensitive q8h SSI and adjust as needed  --Continue MIVF D5 '@50'$  mL/hr in effort to avoid hypoglycemia.  Expect increase in blood sugars with increased TPN rate and increased dextrose concentration in TPN.  Will attempt to reduce rate of D5 to 25 mL/hr tomorrow if blood sugars allow. --Monitor TPN labs on Mon/Thurs. BMP tomorrow AM.  CarolWynelle Cleveland0/2023,9:00 AM

## 2022-03-29 NOTE — Plan of Care (Signed)

## 2022-03-29 NOTE — Progress Notes (Signed)
Subjective:  CC: Leslie Houston is a 71 y.o. female  Hospital stay day 15, 7 Days Post-Op partial SBO versus ileus  HPI: Reported bowel movement last night but reports some fullness today.  Tolerating ice chips otherwise continues to pass flatus  ROS:  General: Denies weight loss, weight gain, fatigue, fevers, chills, and night sweats. Heart: Denies chest pain, palpitations, racing heart, irregular heartbeat, leg pain or swelling, and decreased activity tolerance. Respiratory: Denies breathing difficulty, shortness of breath, wheezing, cough, and sputum. GI: Denies change in appetite, heartburn, nausea, vomiting, constipation, diarrhea, and blood in stool. GU: Denies difficulty urinating, pain with urinating, urgency, frequency, blood in urine.   Objective:   Temp:  [97.7 F (36.5 C)-98.5 F (36.9 C)] 98.3 F (36.8 C) (11/30 0742) Pulse Rate:  [52-57] 56 (11/30 0742) Resp:  [17-20] 17 (11/30 0742) BP: (135-176)/(48-58) 153/55 (11/30 0742) SpO2:  [97 %-99 %] 98 % (11/30 0742)     Height: '5\' 11"'$  (180.3 cm) Weight: 88.2 kg BMI (Calculated): 27.13   Intake/Output this shift:   Intake/Output Summary (Last 24 hours) at 03/29/2022 1437 Last data filed at 03/29/2022 1125 Gross per 24 hour  Intake 2621.36 ml  Output 2700 ml  Net -78.64 ml    Constitutional :  alert, cooperative, appears stated age, and no distress  Respiratory:  clear to auscultation bilaterally  Cardiovascular:  regular rate and rhythm  Gastrointestinal: soft, non-tender; bowel sounds normal; no masses,  no organomegaly.  tympany and distention slightly better.  NG mostly clear output over a liter noted.  Skin: Cool and moist. Staple line c/d/i  Psychiatric: Normal affect, non-agitated, not confused       LABS:     Latest Ref Rng & Units 03/29/2022    4:20 AM 03/27/2022    5:18 AM 03/26/2022    4:15 AM  CMP  Glucose 70 - 99 mg/dL 90  108  102   BUN 8 - 23 mg/dL 33  24  22   Creatinine 0.44 - 1.00 mg/dL  2.23  2.22  2.23   Sodium 135 - 145 mmol/L 141  145  149   Potassium 3.5 - 5.1 mmol/L 3.6  3.6  3.7   Chloride 98 - 111 mmol/L 119  125  127   CO2 22 - 32 mmol/L '19  17  18   '$ Calcium 8.9 - 10.3 mg/dL 8.4  8.6  8.8   Total Protein 6.5 - 8.1 g/dL 5.7  5.7    Total Bilirubin 0.3 - 1.2 mg/dL 0.9  0.7    Alkaline Phos 38 - 126 U/L 95  114    AST 15 - 41 U/L 16  29    ALT 0 - 44 U/L 15  20        Latest Ref Rng & Units 03/27/2022    5:18 AM 03/25/2022    5:00 AM 03/24/2022   11:34 AM  CBC  WBC 4.0 - 10.5 K/uL 6.7  8.8  8.9   Hemoglobin 12.0 - 15.0 g/dL 9.7  10.4  10.9   Hematocrit 36.0 - 46.0 % 30.7  32.6  33.3   Platelets 150 - 400 K/uL 185  226  221     RADS: N/a  Assessment:   S/p ex lap, lysis of adhesions.  Now with postop ileus.     NG back in place, light gastric output noted over a liter noted but she is taking an ice chips routinely.  Will proceed with some gentle stool  softeners to see if we can have more formed bowel movements and less distention.  Add simethicone as well.  Continue TPN and NG tube in the meantime.  Repeat KUB in the a.m.  We did discuss briefly possibility of a second reexploration if clinically she does not improve over the next few days.  labs/images/medications/previous chart entries reviewed personally and relevant changes/updates noted above.

## 2022-03-29 NOTE — Progress Notes (Signed)
Progress Note   Patient: Leslie Houston KZS:[id number] DOB: [birth year] DOA: 03/14/2022     15 DOS: the patient was seen and examined on 03/29/2022   Brief hospital course: Norris Bodley is a 71 y.o. 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  She presented to the ED via EMS on 03/14/2022 for evaluation of severe abdominal pain with associated with nausea and emesis.   Patient was found to have partial SBO vs ileus. She was started on empiric IV antibiotics due to concern for UTI.    Urology and General Surgery are consulted.   11/22: CT abdomen/pelvis with p.o. contrast today per surgery 11/23: CT - small bowel obstruction.  Exploratory laparotomy and lysis of additions 11/24: Postop day 1, attending service changed to surgery. 11/25: NG tube in place 11/26: Patient pulled out NG tube last night and surgical team recommends to leave it out as patient refused 11/27: Patient refusing NG tube, starting TPN 11/28: NG tube reinserted.  KUB shows persistent ileus 11/29: NG remains in place.  Surgery repeating CT scan.    Assessment and Plan: * Small bowel obstruction ([place]) Presented with diffuse abdominal pain, N/V and constipation.  CT scan of abdomen and pelvis showed a few dilated loops of proximal small bowel within the left hemi abdomen gradually taper to the level of the right lower quadrant, where these bowel loops appear to tether to the uterus in the right adnexa.  Findings are suggestive of ileus versus partial small bowel obstruction.  11/18 >> 21 --patient continues to have recurrent obstructive symptoms when resumed on any p.o. intake, refuses to have NG tube  replaced 11/22 - patient vomited 300 cc total of yellow/green material this morning.  CT scan showed High-grade mechanical small bowel obstruction, Potential transition point in the mid abdominal central mesentery 11/23 -underwent exploratory laparotomy lysis of adhesions by Dr. Lysle Pearl 11/24: Postop recovery 11/25: Patient pulled out NG tube 11/26: Waiting for bowel function return 11/27: Started TPN 11/28: NG tube reinserted, KUB shows persistent ileus  UTI (urinary tract infection)-resolved as of 03/27/2022 Versus abnormal UA due to colonization and recent instrumentation.  UTI ruled out  Hypernatremia On D5W.  Sodium 145  Obesity (BMI 30-39.9) BMI 73.22 Complicates overall prognosis and care Lifestyle modification and exercise has been discussed with patient.  Urinary retention Status post recent suprapubic and ureteral catheterization on 11/13 with Dr. Erlene Quan. Plan was to leave ureteral Foley in for 1 week to allow urethral healing with a patent tract.  --Foley removed by Dr. Erlene Quan 11/20 --Suprapubic catheter to remain in place --Outpatient urology follow up as scheduled -Continue Flomax  11/21: Suprapubic catheter leaking around the tube, soaking dressings, gown and linens.  Urology do not think a bowel injury is likely. Per Dr. Erlene Quan, this may be normal and related to bladder spasms.  Most important thing is urine draining through the tube itself.  Seizures ([place]) Continue IV Keppra until taking PO. Seizure precautions.  CKD (chronic kidney disease) stage 4, GFR 15-29 ml/min (HCC) Remained stable  Partial intestinal obstruction (HCC)-resolved as of 03/22/2022 See partial SBO and surgery's recommendations  Hemiparesis affecting right side as late effect of cerebrovascular accident (CVA) (HCC)-resolved as of 03/22/2022 History of prior CVA with left-sided hemiparesis Resume aspirin once  patient is able to tolerate p.o.        Subjective: Pt was sleeping but  woke easily this AM.  Reports having a sore throat from the NG tube, very dry mouth and dry lips.  Denies abdominal pain or nausea/vomiting.   Physical Exam: Vitals:   03/28/22 1652 03/28/22 2034 03/29/22 0441 03/29/22 0742  BP: (!) 176/58 (!) 144/55 (!) 135/48 (!) 153/55  Pulse: (!) 52 (!) 57 (!) 57 (!) 56  Resp: '18 20 18 17  '$ Temp: 97.7 F (36.5 C) 98.5 F (36.9 C) 98.1 F (36.7 C) 98.3 F (36.8 C)  TempSrc:   Oral   SpO2: 98% 99% 97% 98%  Weight:      Height:       General exam: awake, alert, no acute distress HEENT: NG tube in place, to wall suction, moist mucus membranes, hearing grossly normal  Respiratory system: CTAB diminished bases, normal respiratory effort. Cardiovascular system: normal S1/S2, RRR, no pedal edema.   Gastrointestinal system: midline incision with staples intact, distended. Central nervous system: A&O x3. no gross focal neurologic deficits, normal speech Extremities: moves all, SCD's on BLE's Skin: dry, intact, normal temperature Psychiatry: normal mood, congruent affect, judgement and insight appear normal    Data Reviewed:  There are no new results to review at this time.  Family Communication: None   Disposition: Status is: Inpatient Remains inpatient appropriate because: Persistent SBO, NG tube remains in place.   Discharge will be per general surgery, primary service.    Planned Discharge Destination: Home   DVT prophylaxis-SCDs   Time spent: 25 minutes  Author: Ezekiel Slocumb, DO 03/29/2022 3:20 PM  For on call review [email or web address].

## 2022-03-30 ENCOUNTER — Inpatient Hospital Stay: Payer: Medicare Other

## 2022-03-30 DIAGNOSIS — Y846 Urinary catheterization as the cause of abnormal reaction of the patient, or of later complication, without mention of misadventure at the time of the procedure: Secondary | ICD-10-CM | POA: Diagnosis not present

## 2022-03-30 DIAGNOSIS — Y738 Miscellaneous gastroenterology and urology devices associated with adverse incidents, not elsewhere classified: Secondary | ICD-10-CM | POA: Diagnosis not present

## 2022-03-30 DIAGNOSIS — E870 Hyperosmolality and hypernatremia: Secondary | ICD-10-CM | POA: Diagnosis not present

## 2022-03-30 DIAGNOSIS — Z6835 Body mass index (BMI) 35.0-35.9, adult: Secondary | ICD-10-CM | POA: Diagnosis not present

## 2022-03-30 DIAGNOSIS — F32A Depression, unspecified: Secondary | ICD-10-CM | POA: Diagnosis not present

## 2022-03-30 DIAGNOSIS — I959 Hypotension, unspecified: Secondary | ICD-10-CM | POA: Diagnosis not present

## 2022-03-30 DIAGNOSIS — E785 Hyperlipidemia, unspecified: Secondary | ICD-10-CM | POA: Diagnosis not present

## 2022-03-30 DIAGNOSIS — I69351 Hemiplegia and hemiparesis following cerebral infarction affecting right dominant side: Secondary | ICD-10-CM | POA: Diagnosis not present

## 2022-03-30 DIAGNOSIS — I2510 Atherosclerotic heart disease of native coronary artery without angina pectoris: Secondary | ICD-10-CM | POA: Diagnosis not present

## 2022-03-30 DIAGNOSIS — I69354 Hemiplegia and hemiparesis following cerebral infarction affecting left non-dominant side: Secondary | ICD-10-CM | POA: Diagnosis not present

## 2022-03-30 DIAGNOSIS — E872 Acidosis, unspecified: Secondary | ICD-10-CM | POA: Diagnosis not present

## 2022-03-30 DIAGNOSIS — N136 Pyonephrosis: Secondary | ICD-10-CM | POA: Diagnosis not present

## 2022-03-30 DIAGNOSIS — K567 Ileus, unspecified: Secondary | ICD-10-CM | POA: Diagnosis not present

## 2022-03-30 DIAGNOSIS — I130 Hypertensive heart and chronic kidney disease with heart failure and stage 1 through stage 4 chronic kidney disease, or unspecified chronic kidney disease: Secondary | ICD-10-CM | POA: Diagnosis not present

## 2022-03-30 DIAGNOSIS — E860 Dehydration: Secondary | ICD-10-CM | POA: Diagnosis not present

## 2022-03-30 DIAGNOSIS — E875 Hyperkalemia: Secondary | ICD-10-CM | POA: Diagnosis not present

## 2022-03-30 DIAGNOSIS — Z1152 Encounter for screening for COVID-19: Secondary | ICD-10-CM | POA: Diagnosis not present

## 2022-03-30 DIAGNOSIS — K56609 Unspecified intestinal obstruction, unspecified as to partial versus complete obstruction: Secondary | ICD-10-CM | POA: Diagnosis not present

## 2022-03-30 DIAGNOSIS — N184 Chronic kidney disease, stage 4 (severe): Secondary | ICD-10-CM | POA: Diagnosis not present

## 2022-03-30 DIAGNOSIS — T83030A Leakage of cystostomy catheter, initial encounter: Secondary | ICD-10-CM | POA: Diagnosis not present

## 2022-03-30 DIAGNOSIS — G40909 Epilepsy, unspecified, not intractable, without status epilepticus: Secondary | ICD-10-CM | POA: Diagnosis not present

## 2022-03-30 DIAGNOSIS — Y838 Other surgical procedures as the cause of abnormal reaction of the patient, or of later complication, without mention of misadventure at the time of the procedure: Secondary | ICD-10-CM | POA: Diagnosis not present

## 2022-03-30 DIAGNOSIS — K9189 Other postprocedural complications and disorders of digestive system: Secondary | ICD-10-CM | POA: Diagnosis not present

## 2022-03-30 DIAGNOSIS — K5669 Other partial intestinal obstruction: Secondary | ICD-10-CM | POA: Diagnosis not present

## 2022-03-30 DIAGNOSIS — R1084 Generalized abdominal pain: Secondary | ICD-10-CM | POA: Diagnosis not present

## 2022-03-30 DIAGNOSIS — E1122 Type 2 diabetes mellitus with diabetic chronic kidney disease: Secondary | ICD-10-CM | POA: Diagnosis not present

## 2022-03-30 DIAGNOSIS — I5032 Chronic diastolic (congestive) heart failure: Secondary | ICD-10-CM | POA: Diagnosis not present

## 2022-03-30 DIAGNOSIS — E669 Obesity, unspecified: Secondary | ICD-10-CM | POA: Diagnosis not present

## 2022-03-30 DIAGNOSIS — K219 Gastro-esophageal reflux disease without esophagitis: Secondary | ICD-10-CM | POA: Diagnosis not present

## 2022-03-30 LAB — BASIC METABOLIC PANEL
Anion gap: 4 — ABNORMAL LOW (ref 5–15)
BUN: 41 mg/dL — ABNORMAL HIGH (ref 8–23)
CO2: 18 mmol/L — ABNORMAL LOW (ref 22–32)
Calcium: 8.6 mg/dL — ABNORMAL LOW (ref 8.9–10.3)
Chloride: 117 mmol/L — ABNORMAL HIGH (ref 98–111)
Creatinine, Ser: 2.03 mg/dL — ABNORMAL HIGH (ref 0.44–1.00)
GFR, Estimated: 26 mL/min — ABNORMAL LOW (ref >60)
Glucose, Bld: 108 mg/dL — ABNORMAL HIGH (ref 70–99)
Potassium: 3.8 mmol/L (ref 3.5–5.1)
Sodium: 139 mmol/L (ref 135–145)

## 2022-03-30 LAB — GLUCOSE, CAPILLARY
Glucose-Capillary: 107 mg/dL — ABNORMAL HIGH (ref 70–99)
Glucose-Capillary: 116 mg/dL — ABNORMAL HIGH (ref 70–99)
Glucose-Capillary: 107 mg/dL — ABNORMAL HIGH (ref 70–99)
Glucose-Capillary: 93 mg/dL (ref 70–99)
Glucose-Capillary: 114 mg/dL — ABNORMAL HIGH (ref 70–99)

## 2022-03-30 MED ORDER — TRAVASOL 10 % IV SOLN
INTRAVENOUS | Status: AC
  Filled 2022-03-30: qty 842.4

## 2022-03-30 MED ORDER — DOCUSATE SODIUM 100 MG PO CAPS
100.0000 mg | ORAL_CAPSULE | Freq: Two times a day (BID) | ORAL | Status: DC
  Administered 2022-03-30 – 2022-04-04 (×11): 100 mg via ORAL
  Filled 2022-03-30 (×13): qty 1

## 2022-03-30 MED ORDER — POLYETHYLENE GLYCOL 3350 17 G PO PACK
17.0000 g | PACK | Freq: Two times a day (BID) | ORAL | Status: DC
  Administered 2022-03-30 – 2022-04-03 (×10): 17 g via ORAL
  Filled 2022-03-30 (×11): qty 1

## 2022-03-30 MED ORDER — METOCLOPRAMIDE HCL 5 MG/ML IJ SOLN
5.0000 mg | Freq: Four times a day (QID) | INTRAMUSCULAR | Status: DC
  Administered 2022-03-30 – 2022-04-05 (×25): 5 mg via INTRAVENOUS
  Filled 2022-03-30 (×24): qty 2

## 2022-03-30 NOTE — Progress Notes (Signed)
Progress Note   Patient: Leslie Houston IWP:[id number] DOB: 23-Aug-1950 DOA: 03/14/2022     16 DOS: the patient was seen and examined on 03/30/2022   Brief hospital course: [name] is a 71 y.o. 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  She presented to the ED via EMS on 03/14/2022 for evaluation of severe abdominal pain with associated with nausea and emesis.   Patient was found to have partial SBO vs ileus. She was started on empiric IV antibiotics due to concern for UTI.    Urology and General Surgery are consulted.   11/22: CT abdomen/pelvis with p.o. contrast today per surgery 11/23: CT - small bowel obstruction.  Exploratory laparotomy and lysis of additions 11/24: Postop day 1, attending service changed to surgery. 11/25: NG tube in place 11/26: Patient pulled out NG tube last night and surgical team recommends to leave it out as patient refused 11/27: Patient refusing NG tube, starting TPN 11/28: NG tube reinserted.  KUB shows persistent ileus 11/29: NG remains in place.  Surgery repeating CT scan. 11/30: Persistent post-op ilues.  Started on reglan and increased stool softeners.  KUB ?slight improvement    Assessment and Plan: * Small bowel obstruction ([place]) Presented with diffuse abdominal pain, N/V and constipation.  CT scan of abdomen and pelvis showed a few dilated loops of proximal small bowel within the left hemi abdomen gradually taper to the level of the right lower quadrant, where these bowel loops appear to tether to the uterus in the right adnexa.  Findings are suggestive of ileus versus partial small bowel obstruction.  11/18 >> 21 --patient  continues to have recurrent obstructive symptoms when resumed on any p.o. intake, refuses to have NG tube replaced 11/22 - patient vomited 300 cc total of yellow/green material this morning.  CT scan showed High-grade mechanical small bowel obstruction, Potential transition point in the mid abdominal central mesentery 11/23 -underwent exploratory laparotomy lysis of adhesions by Dr. Lysle Pearl 11/24: Postop recovery 11/25: Patient pulled out NG tube 11/26: Waiting for bowel function return 11/27: Started TPN 11/28: NG tube reinserted, KUB shows persistent ileus 11/29-30: as above, persistent ileus. Reglan added.  UTI (urinary tract infection)-resolved as of 03/27/2022 Versus abnormal UA due to colonization and recent instrumentation.  UTI ruled out  Hypernatremia On D5W.  Sodium 145  Obesity (BMI 30-39.9) BMI 50.53 Complicates overall prognosis and care Lifestyle modification and exercise has been discussed with patient.  Urinary retention Status post recent suprapubic and ureteral catheterization on 11/13 with Dr. Erlene Quan. Plan was to leave ureteral Foley in for 1 week to allow urethral healing with a patent tract.  --Foley removed by Dr. Erlene Quan 11/20 --Suprapubic catheter to remain in place --Outpatient urology follow up as scheduled -Continue Flomax  11/21: Suprapubic catheter leaking around the tube, soaking dressings, gown and linens.  Urology do not think a bowel injury is likely. Per Dr. Erlene Quan, this may be normal and related to bladder spasms.  Most important thing is urine draining through the tube itself.  Seizures ([place]) Continue IV Keppra until taking PO. Seizure precautions.  CKD (chronic kidney disease) stage 4, GFR 15-29 ml/min (HCC) Remained stable  Partial intestinal obstruction (HCC)-resolved as of 03/22/2022 See partial SBO and surgery's recommendations  Hemiparesis affecting  right side as late effect of cerebrovascular accident (CVA) (HCC)-resolved as of  03/22/2022 History of prior CVA with left-sided hemiparesis Resume aspirin once patient is able to tolerate p.o.        Subjective: Pt was awake sitting up in bed this AM.  Reports having a good BM earlier this AM.  Hoping NG tube will get to be removed soon.  Still having some throat discomfort.  Denies feeling hungry.    Physical Exam: Vitals:   03/29/22 2001 03/30/22 0427 03/30/22 0500 03/30/22 0732  BP: (!) 151/53 (!) 122/40  (!) 118/45  Pulse: (!) 55 (!) 57  (!) 58  Resp: '20 18  16  '$ Temp: 97.8 F (36.6 C) 98.2 F (36.8 C)  (!) 97.5 F (36.4 C)  TempSrc:    Oral  SpO2: 97% 96%  96%  Weight:   88 kg   Height:       General exam: awake, alert, no acute distress HEENT: NG tube in place (clamped this AM), moist mucus membranes, hearing grossly normal  Respiratory system: CTAB diminished bases, normal respiratory effort. Cardiovascular system: normal S1/S2, RRR, no pedal edema.   Gastrointestinal system: abdomen remains distended. Central nervous system: A&O x3. no gross focal neurologic deficits, normal speech Extremities: moves all, SCD's on BLE's Skin: dry, intact, normal temperature Psychiatry: normal mood, congruent affect, judgement and insight appear normal    Data Reviewed:  There are no new results to review at this time.  Family Communication: None   Disposition: Status is: Inpatient Remains inpatient appropriate because: Persistent SBO, NG tube remains in place.   Discharge will be per general surgery, primary service.    Planned Discharge Destination: Home   DVT prophylaxis-SCDs   Time spent: 25 minutes  Author: Ezekiel Slocumb, DO 03/30/2022 11:24 AM  For on call review [email or web address].

## 2022-03-30 NOTE — Assessment & Plan Note (Addendum)
Presented with diffuse abdominal pain, N/V and constipation.  CT scan of abdomen and pelvis showed a few dilated loops of proximal small bowel within the left hemi abdomen gradually taper to the level of the right lower quadrant, where these bowel loops appear to tether to the uterus in the right adnexa.  Findings are suggestive of ileus versus partial small bowel obstruction.  11/18 >> 21 --patient continues to have recurrent obstructive symptoms when resumed on any p.o. intake, refuses to have NG tube replaced 11/22 - patient vomited 300 cc total of yellow/green material this morning.  CT scan showed High-grade mechanical small bowel obstruction, Potential transition point in the mid abdominal central mesentery 11/23 -underwent exploratory laparotomy lysis of adhesions by Dr. Lysle Pearl 11/24: Postop recovery 11/25: Patient pulled out NG tube 11/26: Waiting for bowel function return 11/27: Started TPN 11/28: NG tube reinserted, KUB shows persistent ileus 11/29>>12/2: as above, persistent ileus. Reglan added. 12/3: NG clamp trial, mild improvement on KUB 12/4: NG tube removed 12/5: tolerating full liquids

## 2022-03-30 NOTE — Progress Notes (Signed)
Subjective:  CC: Leslie Houston is a 71 y.o. female  Hospital stay day 16, 8 Days Post-Op partial SBO versus ileus  HPI: Reported bowel movement last night again.  Tolerating ice chips otherwise continues to pass flatus  ROS:  General: Denies weight loss, weight gain, fatigue, fevers, chills, and night sweats. Heart: Denies chest pain, palpitations, racing heart, irregular heartbeat, leg pain or swelling, and decreased activity tolerance. Respiratory: Denies breathing difficulty, shortness of breath, wheezing, cough, and sputum. GI: Denies change in appetite, heartburn, nausea, vomiting, constipation, diarrhea, and blood in stool. GU: Denies difficulty urinating, pain with urinating, urgency, frequency, blood in urine.   Objective:   Temp:  [97.5 F (36.4 C)-98.8 F (37.1 C)] 97.5 F (36.4 C) (12/01 0732) Pulse Rate:  [55-58] 58 (12/01 0732) Resp:  [16-20] 16 (12/01 0732) BP: (118-157)/(40-53) 118/45 (12/01 0732) SpO2:  [96 %-97 %] 96 % (12/01 0732) Weight:  [88 kg] 88 kg (12/01 0500)     Height: '5\' 11"'$  (180.3 cm) Weight: 88 kg BMI (Calculated): 27.07   Intake/Output this shift:   Intake/Output Summary (Last 24 hours) at 03/30/2022 3785 Last data filed at 03/30/2022 0551 Gross per 24 hour  Intake 790.08 ml  Output 1650 ml  Net -859.92 ml    Constitutional :  alert, cooperative, appears stated age, and no distress  Respiratory:  clear to auscultation bilaterally  Cardiovascular:  regular rate and rhythm  Gastrointestinal: soft, non-tender; bowel sounds normal; no masses,  no organomegaly.  tympany and distention slightly better again.  NG mostly clear output over a liter noted.  Skin: Cool and moist. Staple line c/d/i  Psychiatric: Normal affect, non-agitated, not confused       LABS:     Latest Ref Rng & Units 03/30/2022    4:55 AM 03/29/2022    4:20 AM 03/27/2022    5:18 AM  CMP  Glucose 70 - 99 mg/dL 108  90  108   BUN 8 - 23 mg/dL 41  33  24   Creatinine 0.44 -  1.00 mg/dL 2.03  2.23  2.22   Sodium 135 - 145 mmol/L 139  141  145   Potassium 3.5 - 5.1 mmol/L 3.8  3.6  3.6   Chloride 98 - 111 mmol/L 117  119  125   CO2 22 - 32 mmol/L '18  19  17   '$ Calcium 8.9 - 10.3 mg/dL 8.6  8.4  8.6   Total Protein 6.5 - 8.1 g/dL  5.7  5.7   Total Bilirubin 0.3 - 1.2 mg/dL  0.9  0.7   Alkaline Phos 38 - 126 U/L  95  114   AST 15 - 41 U/L  16  29   ALT 0 - 44 U/L  15  20       Latest Ref Rng & Units 03/27/2022    5:18 AM 03/25/2022    5:00 AM 03/24/2022   11:34 AM  CBC  WBC 4.0 - 10.5 K/uL 6.7  8.8  8.9   Hemoglobin 12.0 - 15.0 g/dL 9.7  10.4  10.9   Hematocrit 36.0 - 46.0 % 30.7  32.6  33.3   Platelets 150 - 400 K/uL 185  226  221     RADS: Pending official report this a.m.  Assessment:   S/p ex lap, lysis of adhesions.  Now with postop ileus.     NG back in place, light gastric output noted over a liter noted but she is taking in ice  chips routinely.  Increasing stool softener regimen and adding Reglan.  Continue TPN and NG tube in the meantime.  Repeat KUB shows possible slight improvement?  We did discuss briefly again possibility of a second reexploration if clinically she does not improve over the next few days.  labs/images/medications/previous chart entries reviewed personally and relevant changes/updates noted above.

## 2022-03-30 NOTE — Progress Notes (Addendum)
PHARMACY - TOTAL PARENTERAL NUTRITION CONSULT NOTE   Indication: Prolonged ileus  Patient Measurements: Height: '5\' 11"'$  (180.3 cm) Weight: 88 kg (194 lb 0.1 oz) IBW/kg (Calculated) : 70.8   Body mass index is 27.06 kg/m. Usual Weight: 86.8 kg  Assessment:  71 year old female admitted with small bowel obstruction s/p lysis of adhesions. Past medical history includes diabetes mellitus (A1c 4.7% on 12/05/21), CKD4, depression, history of prior CVA, CAD, anxiety. On 11/25, patient pulled out NG tube. Starting TPN for prolonged ileus.  Glucose / Insulin:  CBGs 100-116 in previous 24 hours. Improved from prior after advancing TPN and increasing dextrose % in TPN.  Electrolytes:  Na+ 149 > 145 > 141.  K+ improved. 11/26 K+3.3 (given Kcl PO 40 mEq) > 11/27 K+3.7 >> stable Cl improving 125 > 119 > 117 CO2 improving 17 > 19 > 18 Phos stable 2.5 > 3.3  Renal: Scr 2.03 at baseline  Hepatic: 11/15 AST/ALT: 16/9, Tbili 0.6  Intake / Output; MIVF:  MIVF D5 @ 50 mL/hr in previous 24 hours I&O +223 mL Intake: +2.27 L from IV  Output: -450 mL (urine), 1,600 mL (NG output)  GI Imaging: --CT scan of abdomen and pelvis: few dilated loops of proximal small bowel within the left hemi abdomen gradually taper to the level of the right lower quadrant, where these bowel loops appear to tether to the uterus in the right adnexa.  --KUB 12/1: stable small bowel dilatation   GI Surgeries / Procedures:  11/23 Exploratory laparotomy   Central access:  PICC double lumen 03/22/22  TPN start date:  03/26/22 03/29/22 advanced to goal rate  Nutritional Goals: Goal TPN rate is 65 mL/hr (provides 54 g/L of protein and 2160 kcals per day)  RD Assessment: Estimated Needs Total Energy Estimated Needs: 2100-2300 Total Protein Estimated Needs: 115-130 grams Total Fluid Estimated Needs: > 2 L Kcal:  2100-2300 Protein:  115-130 grams Fluid:  > 2 L  Current Nutrition:  NPO  Plan:  --Continue TPN at  65 mL/hr at 1800 (goal rate) --Electrolytes in TPN: Na 64mq/L, K 518m/L, Ca 92m28mL, Mg 92mE3m, and Phos 192mm592m. Cl:Ac 1:1 High refeeding risk, though phos is stable. Will check phos tomorrow AM. CKD4 with renal function at baseline. Consider 1:2 if CO2/Chloride worsens significantly --Add standard MVI and trace elements to TPN --Continue Sensitive q8h SSI and adjust as needed  --Reduce D5W to 25 mL/hr in effort to reduce intake and net positive fluid status --Monitor TPN labs on Mon/Thurs. BMP tomorrow AM.   CarolGlean SalvormD, BCPS Clinical Pharmacist  03/30/2022 9:04 AM

## 2022-03-31 ENCOUNTER — Inpatient Hospital Stay: Payer: Medicare Other

## 2022-03-31 DIAGNOSIS — K5669 Other partial intestinal obstruction: Secondary | ICD-10-CM | POA: Diagnosis not present

## 2022-03-31 DIAGNOSIS — K56609 Unspecified intestinal obstruction, unspecified as to partial versus complete obstruction: Secondary | ICD-10-CM | POA: Diagnosis not present

## 2022-03-31 DIAGNOSIS — R1084 Generalized abdominal pain: Secondary | ICD-10-CM | POA: Diagnosis not present

## 2022-03-31 LAB — BASIC METABOLIC PANEL
Anion gap: 4 — ABNORMAL LOW (ref 5–15)
BUN: 49 mg/dL — ABNORMAL HIGH (ref 8–23)
CO2: 17 mmol/L — ABNORMAL LOW (ref 22–32)
Calcium: 8.8 mg/dL — ABNORMAL LOW (ref 8.9–10.3)
Chloride: 118 mmol/L — ABNORMAL HIGH (ref 98–111)
Creatinine, Ser: 1.96 mg/dL — ABNORMAL HIGH (ref 0.44–1.00)
GFR, Estimated: 27 mL/min — ABNORMAL LOW (ref >60)
Glucose, Bld: 109 mg/dL — ABNORMAL HIGH (ref 70–99)
Potassium: 4.4 mmol/L (ref 3.5–5.1)
Sodium: 139 mmol/L (ref 135–145)

## 2022-03-31 LAB — GLUCOSE, CAPILLARY
Glucose-Capillary: 115 mg/dL — ABNORMAL HIGH (ref 70–99)
Glucose-Capillary: 119 mg/dL — ABNORMAL HIGH (ref 70–99)
Glucose-Capillary: 107 mg/dL — ABNORMAL HIGH (ref 70–99)
Glucose-Capillary: 115 mg/dL — ABNORMAL HIGH (ref 70–99)
Glucose-Capillary: 104 mg/dL — ABNORMAL HIGH (ref 70–99)
Glucose-Capillary: 108 mg/dL — ABNORMAL HIGH (ref 70–99)

## 2022-03-31 LAB — PHOSPHORUS: Phosphorus: 3.6 mg/dL (ref 2.5–4.6)

## 2022-03-31 MED ORDER — TRAVASOL 10 % IV SOLN
INTRAVENOUS | Status: AC
  Filled 2022-03-31: qty 842.4

## 2022-03-31 MED ORDER — INSULIN ASPART 100 UNIT/ML IJ SOLN
0.0000 [IU] | Freq: Three times a day (TID) | INTRAMUSCULAR | Status: DC
  Administered 2022-04-01: 1 [IU] via SUBCUTANEOUS

## 2022-03-31 NOTE — Progress Notes (Signed)
CC: Ileus Subjective: Postoperative day #9 for lysis of adhesions. G-tube output is 865.  She does have good urinary output close to 2 L. Does have chronic kidney disease and is acidotic She is doing okay does not have much complaints but some mild pain.  KUB personally reviewed and there is only diffuse loops of dilated bowel.  NG tube needs to be advanced.  There is no free air no pneumatosis. + flatus Repeat CT from 11/29 c/w ileus w contrast in the colon  Objective: Vital signs in last 24 hours: Temp:  [97.9 F (36.6 C)-99 F (37.2 C)] 98.7 F (37.1 C) (12/02 1215) Pulse Rate:  [56-59] 59 (12/02 1215) Resp:  [16-18] 16 (12/02 1215) BP: (116-133)/(43-54) 117/50 (12/02 1215) SpO2:  [96 %-100 %] 96 % (12/02 0728) Weight:  [87.6 kg] 87.6 kg (12/02 0500) Last BM Date : 03/30/22 (per pt)  Intake/Output from previous day: 12/01 0701 - 12/02 0700 In: 2118.9 [I.V.:2118.9] Out: 2665 [Urine:1800; Emesis/NG output:865] Intake/Output this shift: Total I/O In: -  Out: 700 [Urine:700]  Physical exam:NAD , debilitated and chronically ill ABd: soft, decrease bs, mildly distended. Incision healing well, no infection, no peritonitis.  Mild appropriate incisional tenderness.   Lab Results: CBC  No results for input(s): "WBC", "HGB", "HCT", "PLT" in the last 72 hours. BMET Recent Labs    03/30/22 0455 03/31/22 0605  NA 139 139  K 3.8 4.4  CL 117* 118*  CO2 18* 17*  GLUCOSE 108* 109*  BUN 41* 49*  CREATININE 2.03* 1.96*  CALCIUM 8.6* 8.8*   PT/INR No results for input(s): "LABPROT", "INR" in the last 72 hours. ABG No results for input(s): "PHART", "HCO3" in the last 72 hours.  Invalid input(s): "PCO2", "PO2"  Studies/Results: DG Abd 1 View  Result Date: 03/31/2022 CLINICAL DATA:  Follow-up dilated bowel loops EXAM: ABDOMEN - 1 VIEW COMPARISON:  03/30/2022 and prior studies FINDINGS: Enteric tube again noted with tip overlying the proximal stomach and side hole overlying  the distal esophagus-recommend advancement. Mildly distended small bowel loops are again noted and not significantly changed. Oral contrast within the colon and rectum is again noted. Surgical staples again identified. IMPRESSION: 1. Enteric tube with tip overlying the proximal stomach and side hole overlying the distal esophagus-recommend advancement. 2. Unchanged mildly distended small bowel loops. Electronically Signed   By: Margarette Canada M.D.   On: 03/31/2022 08:34   DG Abd 1 View  Result Date: 03/30/2022 CLINICAL DATA:  Abdominal pain and distention. EXAM: ABDOMEN - 1 VIEW COMPARISON:  March 28, 2022. FINDINGS: Nasogastric tube tip is seen in proximal stomach. Residual contrast is noted in nondilated colon. Mildly dilated small bowel loops are noted concerning for ileus. Midline surgical staples are noted. IMPRESSION: Stable small bowel dilatation is noted most consistent with ileus. Electronically Signed   By: Marijo Conception M.D.   On: 03/30/2022 08:18    Anti-infectives: Anti-infectives (From admission, onward)    Start     Dose/Rate Route Frequency Ordered Stop   03/22/22 1919  ceFAZolin (ANCEF) 2-4 GM/100ML-% IVPB       Note to Pharmacy: Caralee Ates L: cabinet override      03/22/22 1919 03/22/22 1933   03/22/22 1833  ceFAZolin (ANCEF) IVPB 2g/100 mL premix       Note to Pharmacy: To be given preop. Cast start at 730pm tonight   2 g 200 mL/hr over 30 Minutes Intravenous 30 min pre-op 03/22/22 1830 03/22/22 1955   03/15/22 1000  ceFEPIme (MAXIPIME) 2 g in sodium chloride 0.9 % 100 mL IVPB  Status:  Discontinued        2 g 200 mL/hr over 30 Minutes Intravenous Every 24 hours 03/14/22 1301 03/15/22 0915   03/14/22 0815  vancomycin (VANCOCIN) IVPB 1000 mg/200 mL premix        1,000 mg 200 mL/hr over 60 Minutes Intravenous  Once 03/14/22 0810 03/14/22 0950   03/14/22 0815  ceFEPIme (MAXIPIME) 2 g in sodium chloride 0.9 % 100 mL IVPB        2 g 200 mL/hr over 30 Minutes Intravenous   Once 03/14/22 0810 03/14/22 6389       Assessment/Plan: Ileus following lysis of adhesions Continue NGT and TPN May need to repeat CT if no improvement No need for emergent surgical intervention We will continue to follow  Caroleen Hamman, MD, FACS  03/31/2022

## 2022-03-31 NOTE — Progress Notes (Signed)
PHARMACY - TOTAL PARENTERAL NUTRITION CONSULT NOTE   Indication: Prolonged ileus  Patient Measurements: Height: '5\' 11"'$  (180.3 cm) Weight: 87.6 kg (193 lb 2 oz) IBW/kg (Calculated) : 70.8   Body mass index is 26.94 kg/m. Usual Weight: 86.8 kg  Assessment:  71 year old female admitted with small bowel obstruction s/p lysis of adhesions. Past medical history includes diabetes mellitus (A1c 4.7% on 12/05/21), CKD4, depression, history of prior CVA, CAD, anxiety. On 11/25, patient pulled out NG tube. Starting TPN for prolonged ileus.  Glucose / Insulin:  CBGs 193-119 in previous 24 hours. Improved from prior after advancing TPN and increasing dextrose % in TPN.  Electrolytes:  Na+ 149 > 145 > 141.  K+ improved. 11/26 K+3.3 (given Kcl PO 40 mEq) > 11/27 K+3.7 >> stable Cl improving 125 > 119 > 117 CO2 improving 17 > 19 > 18 Phos stable 2.5 > 3.3  Renal: Scr 2.03 at baseline  Hepatic: 11/15 AST/ALT: 16/9, Tbili 0.6  Intake / Output; MIVF:  MIVF D5 @ 50 mL/hr in previous 24 hours I&O +223 mL Intake: +2.27 L from IV  Output: -450 mL (urine), 1,600 mL (NG output)  GI Imaging: --CT scan of abdomen and pelvis: few dilated loops of proximal small bowel within the left hemi abdomen gradually taper to the level of the right lower quadrant, where these bowel loops appear to tether to the uterus in the right adnexa.  --KUB 12/1: stable small bowel dilatation   GI Surgeries / Procedures:  11/23 Exploratory laparotomy   Central access:  PICC double lumen 03/22/22  TPN start date:  03/26/22 03/29/22 advanced to goal rate  Nutritional Goals: Goal TPN rate is 65 mL/hr (provides 54 g/L of protein and 2160 kcals per day)  RD Assessment: Estimated Needs Total Energy Estimated Needs: 2100-2300 Total Protein Estimated Needs: 115-130 grams Total Fluid Estimated Needs: > 2 L Kcal:  2100-2300 Protein:  115-130 grams Fluid:  > 2 L  Current Nutrition:  NPO  Plan:  --Continue TPN at  65 mL/hr at 1800 (goal rate) --Electrolytes in TPN: Na 56mq/L, K 543m/L, Ca 46m33mL, Mg 46mE32m, and Phos 146mm62m. Cl:Ac 1:1 High refeeding risk, though phos is stable.  CKD4 with renal function at baseline. Consider 1:2 if CO2/Chloride worsens significantly --Add standard MVI and trace elements to TPN --Continue Sensitive q8h SSI and adjust as needed  --Reduce D5W to 25 mL/hr in effort to reduce intake and net positive fluid status --Monitor TPN labs on Mon/Thurs. BMP tomorrow AM.   KristChinita GreenlandmD Clinical Pharmacist 03/31/2022

## 2022-03-31 NOTE — Progress Notes (Signed)
Progress Note   Patient: Leslie Houston EQA:[id number] DOB: Mar 10, 1951 DOA: 03/14/2022     17 DOS: the patient was seen and examined on 03/31/2022   Brief hospital course: Leslie Houston is a 71 y.o. 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  She presented to the ED via EMS on 03/14/2022 for evaluation of severe abdominal pain with associated with nausea and emesis.   Patient was found to have partial SBO vs ileus. She was started on empiric IV antibiotics due to concern for UTI.    Urology and General Surgery are consulted.   11/22: CT abdomen/pelvis with p.o. contrast today per surgery 11/23: CT - small bowel obstruction.  Exploratory laparotomy and lysis of additions 11/24: Postop day 1, attending service changed to surgery. 11/25: NG tube in place 11/26: Patient pulled out NG tube last night and surgical team recommends to leave it out as patient refused 11/27: Patient refusing NG tube, starting TPN 11/28: NG tube reinserted.  KUB shows persistent ileus 11/29: NG remains in place.  Surgery repeating CT scan. 11/30 >> 12/2: Persistent post-op ilues.  Started on reglan and increased stool softeners.  KUB ?slight improvement    Assessment and Plan: * Small bowel obstruction ([place]) Presented with diffuse abdominal pain, N/V and constipation.  CT scan of abdomen and pelvis showed a few dilated loops of proximal small bowel within the left hemi abdomen gradually taper to the level of the right lower quadrant, where these bowel loops appear to tether to the uterus in the right adnexa.  Findings are suggestive of ileus versus partial small bowel obstruction.  11/18 >> 21  --patient continues to have recurrent obstructive symptoms when resumed on any p.o. intake, refuses to have NG tube replaced 11/22 - patient vomited 300 cc total of yellow/green material this morning.  CT scan showed High-grade mechanical small bowel obstruction, Potential transition point in the mid abdominal central mesentery 11/23 -underwent exploratory laparotomy lysis of adhesions by Dr. Lysle Pearl 11/24: Postop recovery 11/25: Patient pulled out NG tube 11/26: Waiting for bowel function return 11/27: Started TPN 11/28: NG tube reinserted, KUB shows persistent ileus 11/29>>12/2: as above, persistent ileus. Reglan added.  UTI (urinary tract infection)-resolved as of 03/27/2022 Versus abnormal UA due to colonization and recent instrumentation.  UTI ruled out  Hypernatremia On D5W.  Sodium 145  Obesity (BMI 30-39.9) BMI 97.98 Complicates overall prognosis and care Lifestyle modification and exercise has been discussed with patient.  Urinary retention Status post recent suprapubic and ureteral catheterization on 11/13 with Dr. Erlene Quan. Plan was to leave ureteral Foley in for 1 week to allow urethral healing with a patent tract.  --Foley removed by Dr. Erlene Quan 11/20 --Suprapubic catheter to remain in place --Outpatient urology follow up as scheduled -Continue Flomax  11/21: Suprapubic catheter leaking around the tube, soaking dressings, gown and linens.  Urology do not think a bowel injury is likely. Per Dr. Erlene Quan, this may be normal and related to bladder spasms.  Most important thing is urine draining through the tube itself.  Seizures ([name]) Continue IV Keppra until taking PO. Seizure precautions.  CKD (chronic kidney disease) stage 4, GFR 15-29 ml/min (HCC) Remained stable  Partial intestinal obstruction (HCC)-resolved as of 03/22/2022 See partial SBO and surgery's recommendations  Hemiparesis affecting right side as late effect of cerebrovascular accident (CVA)  (HCC)-resolved as of 03/22/2022 History of prior CVA with left-sided hemiparesis Resume aspirin once patient is able to tolerate p.o.        Subjective: Pt was sleeping soundly this AM.  No acute complaints or acute events reported.  NG tube remains in place.   Physical Exam: Vitals:   03/30/22 1939 03/31/22 0432 03/31/22 0500 03/31/22 0728  BP: (!) 132/43 (!) 116/44  (!) 124/53  Pulse: (!) 56 (!) 58  (!) 57  Resp: '18 18  16  '$ Temp: 97.9 F (36.6 C) 99 F (37.2 C)  98.6 F (37 C)  TempSrc: Oral Oral    SpO2: 96% 97%  96%  Weight:   87.6 kg   Height:       General exam: sleeping comfortably no acute distress HEENT: NG tube in place Respiratory system: on room air, normal respiratory effort. Cardiovascular system: RRR, no pedal edema.   Gastrointestinal system: abdomen remains distended. Central nervous system: A&O x3. no gross focal neurologic deficits, normal speech Extremities: moves all, SCD's on BLE's Skin: dry, intact, normal temperature Psychiatry: normal mood, congruent affect, judgement and insight appear normal    Data Reviewed:  BMP notable for Cl 118, bicarb 17, glucose 109, BUN 49, Cr 1.96 from 2.03, Ca 8.8, gap 4, GFR 27.    KUB this AM -- no change in mildly distended small bowel loops  Family Communication: None   Disposition: Status is: Inpatient Remains inpatient appropriate because: Persistent SBO.   Discharge will be per general surgery, primary service.    Planned Discharge Destination: Home   DVT prophylaxis-SCDs   Time spent: 25 minutes  Author: Ezekiel Slocumb, DO 03/31/2022 10:36 AM  For on call review [email or web address].

## 2022-04-01 ENCOUNTER — Inpatient Hospital Stay: Payer: Medicare Other

## 2022-04-01 DIAGNOSIS — R1084 Generalized abdominal pain: Secondary | ICD-10-CM | POA: Diagnosis not present

## 2022-04-01 DIAGNOSIS — K5669 Other partial intestinal obstruction: Secondary | ICD-10-CM | POA: Diagnosis not present

## 2022-04-01 DIAGNOSIS — K56609 Unspecified intestinal obstruction, unspecified as to partial versus complete obstruction: Secondary | ICD-10-CM | POA: Diagnosis not present

## 2022-04-01 LAB — CBC
HCT: 27.6 % — ABNORMAL LOW (ref 36.0–46.0)
Hemoglobin: 9.1 g/dL — ABNORMAL LOW (ref 12.0–15.0)
MCH: 33 pg (ref 26.0–34.0)
MCHC: 33 g/dL (ref 30.0–36.0)
MCV: 100 fL (ref 80.0–100.0)
Platelets: 211 10*3/uL (ref 150–400)
RBC: 2.76 MIL/uL — ABNORMAL LOW (ref 3.87–5.11)
RDW: 12.7 % (ref 11.5–15.5)
WBC: 6.8 10*3/uL (ref 4.0–10.5)
nRBC: 0 % (ref 0.0–0.2)

## 2022-04-01 LAB — GLUCOSE, CAPILLARY
Glucose-Capillary: 104 mg/dL — ABNORMAL HIGH (ref 70–99)
Glucose-Capillary: 106 mg/dL — ABNORMAL HIGH (ref 70–99)
Glucose-Capillary: 107 mg/dL — ABNORMAL HIGH (ref 70–99)
Glucose-Capillary: 109 mg/dL — ABNORMAL HIGH (ref 70–99)
Glucose-Capillary: 110 mg/dL — ABNORMAL HIGH (ref 70–99)
Glucose-Capillary: 115 mg/dL — ABNORMAL HIGH (ref 70–99)
Glucose-Capillary: 131 mg/dL — ABNORMAL HIGH (ref 70–99)

## 2022-04-01 LAB — BASIC METABOLIC PANEL
Anion gap: 4 — ABNORMAL LOW (ref 5–15)
BUN: 52 mg/dL — ABNORMAL HIGH (ref 8–23)
CO2: 18 mmol/L — ABNORMAL LOW (ref 22–32)
Calcium: 8.9 mg/dL (ref 8.9–10.3)
Chloride: 118 mmol/L — ABNORMAL HIGH (ref 98–111)
Creatinine, Ser: 2.13 mg/dL — ABNORMAL HIGH (ref 0.44–1.00)
GFR, Estimated: 24 mL/min — ABNORMAL LOW (ref >60)
Glucose, Bld: 108 mg/dL — ABNORMAL HIGH (ref 70–99)
Potassium: 4.5 mmol/L (ref 3.5–5.1)
Sodium: 140 mmol/L (ref 135–145)

## 2022-04-01 MED ORDER — TRAVASOL 10 % IV SOLN
INTRAVENOUS | Status: AC
  Filled 2022-04-01: qty 842.4

## 2022-04-01 NOTE — Progress Notes (Signed)
Physical Therapy Treatment Patient Details Name: Leslie Houston MRN: [id number] DOB: 01-Sep-1950 Today's Date: 04/01/2022   History of Present Illness Pt is a 71 y/o F admitted on 03/14/22 after presenting to the ED with c/o severe abdominal pain associated with nausea & emesis. Pt was found to have partial SBO vs ileus. Pt is s/p ex lap on 11/23. PMH: DM, CKD IV, depression, CVA with hearing loss, CAD, anxiety, s/p recent suprapubic catheter placement for chronic urinary retention & urethral dilation for stricture 03/12/22    PT Comments    OOB with increased time and rail but no assist.  She is able to walk x 2 laps on unit with RW and slow but steady gait.  To bathroom for small loose BM and passing gas.  Opted to sit in recliner after session.  Needs in reach.  Reported feeling full and bloated but improved after gait and bathroom.   Recommendations for follow up therapy are one component of a multi-disciplinary discharge planning process, led by the attending physician.  Recommendations may be updated based on patient status, additional functional criteria and insurance authorization.  Follow Up Recommendations  Home health PT     Assistance Recommended at Discharge Intermittent Supervision/Assistance  Patient can return home with the following A little help with walking and/or transfers;A little help with bathing/dressing/bathroom;Assistance with cooking/housework;Help with stairs or ramp for entrance   Equipment Recommendations  Rolling walker (2 wheels)    Recommendations for Other Services       Precautions / Restrictions Precautions Precautions: Fall Precaution Comments: suprapubic cath Restrictions Weight Bearing Restrictions: No     Mobility  Bed Mobility Overal bed mobility: Needs Assistance Bed Mobility: Supine to Sit Rolling: Supervision Sidelying to sit: Supervision       General bed mobility comments: able to complete without assist     Transfers Overall transfer level: Needs assistance Equipment used: Rolling walker (2 wheels) Transfers: Sit to/from Stand                  Ambulation/Gait Ambulation/Gait assistance: Supervision, Min guard Gait Distance (Feet): 360 Feet Assistive device: Rolling walker (2 wheels) Gait Pattern/deviations: Step-through pattern, Decreased stride length Gait velocity: WFL     General Gait Details: 2 laps on unit   Stairs             Wheelchair Mobility    Modified Rankin (Stroke Patients Only)       Balance Overall balance assessment: Needs assistance Sitting-balance support: Feet supported, Bilateral upper extremity supported Sitting balance-Leahy Scale: Good     Standing balance support: During functional activity, Bilateral upper extremity supported, Reliant on assistive device for balance Standing balance-Leahy Scale: Fair Standing balance comment: walker for support but overall does well.                            Cognition Arousal/Alertness: Awake/alert Behavior During Therapy: WFL for tasks assessed/performed Overall Cognitive Status: Within Functional Limits for tasks assessed                                          Exercises Other Exercises Other Exercises: to bathroom for small loose BM.  RN notified    General Comments        Pertinent Vitals/Pain Pain Assessment Pain Assessment: Faces Faces Pain Scale: Hurts little more Pain Location: abdominal  incision and feeling bloated "full" Pain Descriptors / Indicators: Sore Pain Intervention(s): Monitored during session, Repositioned    Home Living                          Prior Function            PT Goals (current goals can now be found in the care plan section) Progress towards PT goals: Progressing toward goals    Frequency    Min 2X/week      PT Plan Current plan remains appropriate    Co-evaluation              AM-PAC  PT "6 Clicks" Mobility   Outcome Measure  Help needed turning from your back to your side while in a flat bed without using bedrails?: A Little Help needed moving from lying on your back to sitting on the side of a flat bed without using bedrails?: A Little Help needed moving to and from a bed to a chair (including a wheelchair)?: A Little Help needed standing up from a chair using your arms (e.g., wheelchair or bedside chair)?: A Little Help needed to walk in hospital room?: A Little Help needed climbing 3-5 steps with a railing? : A Little 6 Click Score: 18    End of Session Equipment Utilized During Treatment: Gait belt Activity Tolerance: Patient tolerated treatment well Patient left: in chair;with call bell/phone within reach;with chair alarm set Nurse Communication: Mobility status PT Visit Diagnosis: Difficulty in walking, not elsewhere classified (R26.2);Unsteadiness on feet (R26.81)     Time: 6599-3570 PT Time Calculation (min) (ACUTE ONLY): 20 min  Charges:  $Gait Training: 8-22 mins                   Chesley Noon, PTA 04/01/22, 3:51 PM

## 2022-04-01 NOTE — Progress Notes (Signed)
Progress Note   Patient: Leslie Houston BOF:[id number] DOB: Dec 25, 1950 DOA: 03/14/2022     18 DOS: the patient was seen and examined on 04/01/2022   Brief hospital course: Leslie Houston is a 71 y.o. 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  She presented to the ED via EMS on 03/14/2022 for evaluation of severe abdominal pain with associated with nausea and emesis.   Patient was found to have partial SBO vs ileus. She was started on empiric IV antibiotics due to concern for UTI.    Urology and General Surgery are consulted.   11/22: CT abdomen/pelvis with p.o. contrast today per surgery 11/23: CT - small bowel obstruction.  Exploratory laparotomy and lysis of additions 11/24: Postop day 1, attending service changed to surgery. 11/25: NG tube in place 11/26: Patient pulled out NG tube last night and surgical team recommends to leave it out as patient refused 11/27: Patient refusing NG tube, starting TPN 11/28: NG tube reinserted.  KUB shows persistent ileus 11/29: NG remains in place.  Surgery repeating CT scan. 11/30 >> 12/2: Persistent post-op ilues.  Started on reglan and increased stool softeners.  KUB ?slight improvement    Assessment and Plan: * Small bowel obstruction ([place]) Presented with diffuse abdominal pain, N/V and constipation.  CT scan of abdomen and pelvis showed a few dilated loops of proximal small bowel within the left hemi abdomen gradually taper to the level of the right lower quadrant, where these bowel loops appear to tether to the uterus in the right adnexa.  Findings are suggestive of ileus versus partial small bowel obstruction.  11/18 >> 21  --patient continues to have recurrent obstructive symptoms when resumed on any p.o. intake, refuses to have NG tube replaced 11/22 - patient vomited 300 cc total of yellow/green material this morning.  CT scan showed High-grade mechanical small bowel obstruction, Potential transition point in the mid abdominal central mesentery 11/23 -underwent exploratory laparotomy lysis of adhesions by Dr. Lysle Pearl 11/24: Postop recovery 11/25: Patient pulled out NG tube 11/26: Waiting for bowel function return 11/27: Started TPN 11/28: NG tube reinserted, KUB shows persistent ileus 11/29>>12/2: as above, persistent ileus. Reglan added.  UTI (urinary tract infection)-resolved as of 03/27/2022 Versus abnormal UA due to colonization and recent instrumentation.  UTI ruled out  Hypernatremia On D5W.  Sodium 145  Obesity (BMI 30-39.9) BMI 77.82 Complicates overall prognosis and care Lifestyle modification and exercise has been discussed with patient.  Urinary retention Status post recent suprapubic and ureteral catheterization on 11/13 with Dr. Erlene Quan. Plan was to leave ureteral Foley in for 1 week to allow urethral healing with a patent tract.  --Foley removed by Dr. Erlene Quan 11/20 --Suprapubic catheter to remain in place --Outpatient urology follow up as scheduled -Continue Flomax  11/21: Suprapubic catheter leaking around the tube, soaking dressings, gown and linens.  Urology do not think a bowel injury is likely. Per Dr. Erlene Quan, this may be normal and related to bladder spasms.  Most important thing is urine draining through the tube itself.  Seizures ([place]) Continue IV Keppra until taking PO. Seizure precautions.  CKD (chronic kidney disease) stage 4, GFR 15-29 ml/min (HCC) Remained stable  Partial intestinal obstruction (HCC)-resolved as of 03/22/2022 See partial SBO and surgery's recommendations  Hemiparesis affecting right side as late effect of cerebrovascular accident (CVA)  (HCC)-resolved as of 03/22/2022 History of prior CVA with left-sided hemiparesis Resume aspirin once patient is able to tolerate p.o.        Subjective: Pt was sleeping comfortably this AM.  No acute complaints or events reported.  NG tube remains in place.   Physical Exam: Vitals:   03/31/22 2013 04/01/22 0444 04/01/22 0500 04/01/22 0749  BP: (!) 133/49 (!) 114/44  (!) 140/47  Pulse: (!) 59 60  (!) 58  Resp: '20 18  16  '$ Temp: 99 F (37.2 C) 98.1 F (36.7 C)  (!) 97.5 F (36.4 C)  TempSrc:  Oral    SpO2: 97% 97%  98%  Weight:   86.9 kg   Height:       General exam: sleeping comfortably no acute distress HEENT: NG tube in place Respiratory system: on room air, normal respiratory effort. Cardiovascular system: RRR, no pedal edema.   Gastrointestinal system: abdomen remains distended. Central nervous system: A&O x3. no gross focal neurologic deficits, normal speech Extremities: moves all, SCD's on BLE's Skin: dry, intact, normal temperature Psychiatry: normal mood, congruent affect, judgement and insight appear normal    Data Reviewed:  BMP notable for Cl 118, bicarb 18, glucose 108, BUN 49, Cr 2.13 from 1.96, gap 4, GFR 24.    KUB this AM -- Mild improvement in diffuse small bowel and colonic dilatation, suspicious for ileus.  Family Communication: None   Disposition: Status is: Inpatient Remains inpatient appropriate because: Persistent SBO.   Discharge will be per general surgery, primary service.    Planned Discharge Destination: Home   DVT prophylaxis-SCDs   Time spent: 25 minutes  Author: Ezekiel Slocumb, DO 04/01/2022 11:23 AM  For on call review [email or web address].

## 2022-04-01 NOTE — Progress Notes (Signed)
CC: ileus Subjective: Postoperative day #10 for lysis of adhesions. NG-tube output is 105.   She is doing okay does not have any complaints today  KUB personally reviewed and there is improvement in bowel dilation, air in the colon.  There is no free air no pneumatosis. + flatus   Objective: Vital signs in last 24 hours: Temp:  [97.5 F (36.4 C)-99 F (37.2 C)] 97.5 F (36.4 C) (12/03 0749) Pulse Rate:  [58-70] 58 (12/03 0749) Resp:  [16-20] 16 (12/03 0749) BP: (114-141)/(44-59) 140/47 (12/03 0749) SpO2:  [95 %-98 %] 98 % (12/03 0749) Weight:  [86.9 kg] 86.9 kg (12/03 0500) Last BM Date : 04/01/22  Intake/Output from previous day: 12/02 0701 - 12/03 0700 In: 1056.8 [I.V.:1056.8] Out: 2555 [Urine:2450; Emesis/NG output:105] Intake/Output this shift: Total I/O In: -  Out: 1100 [Urine:1100]  Physical exam: NAD , debilitated and chronically ill ABd: soft, decrease bs, mildly distended. Incision healing well, no infection, no peritonitis.  Mild appropriate incisional tenderness.  Lab Results: CBC  Recent Labs    04/01/22 0340  WBC 6.8  HGB 9.1*  HCT 27.6*  PLT 211   BMET Recent Labs    03/31/22 0605 04/01/22 0340  NA 139 140  K 4.4 4.5  CL 118* 118*  CO2 17* 18*  GLUCOSE 109* 108*  BUN 49* 52*  CREATININE 1.96* 2.13*  CALCIUM 8.8* 8.9   PT/INR No results for input(s): "LABPROT", "INR" in the last 72 hours. ABG No results for input(s): "PHART", "HCO3" in the last 72 hours.  Invalid input(s): "PCO2", "PO2"  Studies/Results: DG ABD ACUTE 2+V W 1V CHEST  Result Date: 04/01/2022 CLINICAL DATA:  Small-bowel obstruction. EXAM: DG ABDOMEN ACUTE WITH 1 VIEW CHEST COMPARISON:  03/31/2022 FINDINGS: Skin staple seen in the lower anterior abdominal wall soft tissues. Orogastric tube tip remains in the proximal stomach. Mild diffuse dilatation of small bowel and colon show mild improvement since previous study. Mild cardiomegaly is noted.  Both lungs are clear.  IMPRESSION: Mild improvement in diffuse small bowel and colonic dilatation, suspicious for ileus. Electronically Signed   By: Marlaine Hind M.D.   On: 04/01/2022 09:21   DG Abd 1 View  Result Date: 03/31/2022 CLINICAL DATA:  Follow-up dilated bowel loops EXAM: ABDOMEN - 1 VIEW COMPARISON:  03/30/2022 and prior studies FINDINGS: Enteric tube again noted with tip overlying the proximal stomach and side hole overlying the distal esophagus-recommend advancement. Mildly distended small bowel loops are again noted and not significantly changed. Oral contrast within the colon and rectum is again noted. Surgical staples again identified. IMPRESSION: 1. Enteric tube with tip overlying the proximal stomach and side hole overlying the distal esophagus-recommend advancement. 2. Unchanged mildly distended small bowel loops. Electronically Signed   By: Margarette Canada M.D.   On: 03/31/2022 08:34    Anti-infectives: Anti-infectives (From admission, onward)    Start     Dose/Rate Route Frequency Ordered Stop   03/22/22 1919  ceFAZolin (ANCEF) 2-4 GM/100ML-% IVPB       Note to Pharmacy: Caralee Ates L: cabinet override      03/22/22 1919 03/22/22 1933   03/22/22 1833  ceFAZolin (ANCEF) IVPB 2g/100 mL premix       Note to Pharmacy: To be given preop. Cast start at 730pm tonight   2 g 200 mL/hr over 30 Minutes Intravenous 30 min pre-op 03/22/22 1830 03/22/22 1955   03/15/22 1000  ceFEPIme (MAXIPIME) 2 g in sodium chloride 0.9 % 100 mL IVPB  Status:  Discontinued        2 g 200 mL/hr over 30 Minutes Intravenous Every 24 hours 03/14/22 1301 03/15/22 0915   03/14/22 0815  vancomycin (VANCOCIN) IVPB 1000 mg/200 mL premix        1,000 mg 200 mL/hr over 60 Minutes Intravenous  Once 03/14/22 0810 03/14/22 0950   03/14/22 0815  ceFEPIme (MAXIPIME) 2 g in sodium chloride 0.9 % 100 mL IVPB        2 g 200 mL/hr over 30 Minutes Intravenous  Once 03/14/22 0810 03/14/22 8469       Assessment/Plan: Ileus following lysis  of adhesions Slowly improving Clamp NGT today and do a trila of CLD w NG in place Keep TPN No need for emergent surgical intervention We will continue to follow   Caroleen Hamman, MD, FACS  04/01/2022

## 2022-04-01 NOTE — Progress Notes (Signed)
PHARMACY - TOTAL PARENTERAL NUTRITION CONSULT NOTE   Indication: Prolonged ileus  Patient Measurements: Height: '5\' 11"'$  (180.3 cm) Weight: 86.9 kg (191 lb 9.3 oz) IBW/kg (Calculated) : 70.8   Body mass index is 26.72 kg/m. Usual Weight: 86.8 kg  Assessment:  71 year old female admitted with small bowel obstruction s/p lysis of adhesions. Past medical history includes diabetes mellitus (A1c 4.7% on 12/05/21), CKD4, depression, history of prior CVA, CAD, anxiety. On 11/25, patient pulled out NG tube. Starting TPN for prolonged ileus.  Glucose / Insulin:  CBGs 193-119 in previous 24 hours. Improved from prior after advancing TPN and increasing dextrose % in TPN.  Electrolytes:  Na+ 149 > 145 > 141.  K+ improved. 11/26 K+3.3 (given Kcl PO 40 mEq) > 11/27 K+3.7 >> stable Cl improving 125 > 119 > 117 CO2 improving 17 > 19 > 18 Phos stable 2.5 > 3.3  Renal: Scr 2.03 at baseline  Hepatic: 11/15 AST/ALT: 16/9, Tbili 0.6  Intake / Output; MIVF:  MIVF D5 @ 50 mL/hr in previous 24 hours I&O +223 mL Intake: +2.27 L from IV  Output: -450 mL (urine), 1,600 mL (NG output)  GI Imaging: --CT scan of abdomen and pelvis: few dilated loops of proximal small bowel within the left hemi abdomen gradually taper to the level of the right lower quadrant, where these bowel loops appear to tether to the uterus in the right adnexa.  --KUB 12/1: stable small bowel dilatation   GI Surgeries / Procedures:  11/23 Exploratory laparotomy   Central access:  PICC double lumen 03/22/22  TPN start date:  03/26/22 03/29/22 advanced to goal rate  Nutritional Goals: Goal TPN rate is 65 mL/hr (provides 54 g/L of protein and 2160 kcals per day)  RD Assessment: Estimated Needs Total Energy Estimated Needs: 2100-2300 Total Protein Estimated Needs: 115-130 grams Total Fluid Estimated Needs: > 2 L Kcal:  2100-2300 Protein:  115-130 grams Fluid:  > 2 L  Current Nutrition:  NPO  Plan:  --Continue TPN at  65 mL/hr at 1800 (goal rate) --Electrolytes in TPN: Na 46mq/L, K 573m/L, Ca 72m52mL, Mg 72mE58m, and Phos 172mm64m.  -CKD4:  Cl 118, CO2 18 will adjust from Cl:Ac 1:1 to 1:2 on 12/3 High refeeding risk, though phos is stable.  --Add standard MVI and trace elements to TPN --Continue Sensitive q8h SSI and adjust as needed  --Reduce D5W to 25 mL/hr in effort to reduce intake and net positive fluid status --Monitor TPN labs on Mon/Thurs.    KristChinita GreenlandmD Clinical Pharmacist 04/01/2022

## 2022-04-02 DIAGNOSIS — K5669 Other partial intestinal obstruction: Secondary | ICD-10-CM | POA: Diagnosis not present

## 2022-04-02 DIAGNOSIS — R1084 Generalized abdominal pain: Secondary | ICD-10-CM | POA: Diagnosis not present

## 2022-04-02 DIAGNOSIS — K56609 Unspecified intestinal obstruction, unspecified as to partial versus complete obstruction: Secondary | ICD-10-CM | POA: Diagnosis not present

## 2022-04-02 LAB — COMPREHENSIVE METABOLIC PANEL
ALT: 20 U/L (ref 0–44)
AST: 18 U/L (ref 15–41)
Albumin: 2.5 g/dL — ABNORMAL LOW (ref 3.5–5.0)
Alkaline Phosphatase: 115 U/L (ref 38–126)
Anion gap: 5 (ref 5–15)
BUN: 55 mg/dL — ABNORMAL HIGH (ref 8–23)
CO2: 17 mmol/L — ABNORMAL LOW (ref 22–32)
Calcium: 8.9 mg/dL (ref 8.9–10.3)
Chloride: 115 mmol/L — ABNORMAL HIGH (ref 98–111)
Creatinine, Ser: 2.1 mg/dL — ABNORMAL HIGH (ref 0.44–1.00)
GFR, Estimated: 25 mL/min — ABNORMAL LOW (ref >60)
Glucose, Bld: 96 mg/dL (ref 70–99)
Potassium: 5.3 mmol/L — ABNORMAL HIGH (ref 3.5–5.1)
Sodium: 137 mmol/L (ref 135–145)
Total Bilirubin: 0.7 mg/dL (ref 0.3–1.2)
Total Protein: 6.3 g/dL — ABNORMAL LOW (ref 6.5–8.1)

## 2022-04-02 LAB — GLUCOSE, CAPILLARY
Glucose-Capillary: 116 mg/dL — ABNORMAL HIGH (ref 70–99)
Glucose-Capillary: 114 mg/dL — ABNORMAL HIGH (ref 70–99)
Glucose-Capillary: 114 mg/dL — ABNORMAL HIGH (ref 70–99)
Glucose-Capillary: 104 mg/dL — ABNORMAL HIGH (ref 70–99)
Glucose-Capillary: 103 mg/dL — ABNORMAL HIGH (ref 70–99)

## 2022-04-02 LAB — TRIGLYCERIDES: Triglycerides: 69 mg/dL (ref <150)

## 2022-04-02 LAB — MAGNESIUM: Magnesium: 2.3 mg/dL (ref 1.7–2.4)

## 2022-04-02 LAB — PHOSPHORUS: Phosphorus: 3.9 mg/dL (ref 2.5–4.6)

## 2022-04-02 MED ORDER — TRAVASOL 10 % IV SOLN
INTRAVENOUS | Status: AC
  Filled 2022-04-02: qty 842.4

## 2022-04-02 MED ORDER — BOOST / RESOURCE BREEZE PO LIQD CUSTOM
1.0000 | Freq: Three times a day (TID) | ORAL | Status: DC
  Administered 2022-04-02: 1 via ORAL

## 2022-04-02 NOTE — Progress Notes (Signed)
Progress Note   Patient: Leslie Houston PYK:[id number] DOB: 02/05/1951 DOA: 03/14/2022     19 DOS: the patient was seen and examined on 04/02/2022   Brief hospital course: Leslie Houston is a 71 y.o. 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  She presented to the ED via EMS on 03/14/2022 for evaluation of severe abdominal pain with associated with nausea and emesis.   Patient was found to have partial SBO vs ileus. She was started on empiric IV antibiotics due to concern for UTI.    Urology and General Surgery are consulted.   11/22: CT abdomen/pelvis with p.o. contrast today per surgery 11/23: CT - small bowel obstruction.  Exploratory laparotomy and lysis of additions 11/24: Postop day 1, attending service changed to surgery. 11/25: NG tube in place 11/26: Patient pulled out NG tube last night and surgical team recommends to leave it out as patient refused 11/27: Patient refusing NG tube, starting TPN 11/28: NG tube reinserted.  KUB shows persistent ileus 11/29: NG remains in place.  Surgery repeating CT scan. 11/30 >> 12/2: Persistent post-op ilues.  Started on reglan and increased stool softeners.  KUB ?slight improvement    Assessment and Plan: * Small bowel obstruction (Short) Presented with diffuse abdominal pain, N/V and constipation.  CT scan of abdomen and pelvis showed a few dilated loops of proximal small bowel within the left hemi abdomen gradually taper to the level of the right lower quadrant, where these bowel loops appear to tether to the uterus in the right adnexa.  Findings are suggestive of ileus versus partial small bowel obstruction.  11/18 >> 21  --patient continues to have recurrent obstructive symptoms when resumed on any p.o. intake, refuses to have NG tube replaced 11/22 - patient vomited 300 cc total of yellow/green material this morning.  CT scan showed High-grade mechanical small bowel obstruction, Potential transition point in the mid abdominal central mesentery 11/23 -underwent exploratory laparotomy lysis of adhesions by Dr. Lysle Houston 11/24: Postop recovery 11/25: Patient pulled out NG tube 11/26: Waiting for bowel function return 11/27: Started TPN 11/28: NG tube reinserted, KUB shows persistent ileus 11/29>>12/2: as above, persistent ileus. Reglan added. 12/3: NG clamp trial, mild improvement on KUB  UTI (urinary tract infection)-resolved as of 03/27/2022 Versus abnormal UA due to colonization and recent instrumentation.  UTI ruled out  Hypernatremia On D5W.  Sodium 145  Obesity (BMI 30-39.9) BMI 53.97 Complicates overall prognosis and care Lifestyle modification and exercise has been discussed with patient.  Urinary retention Status post recent suprapubic and ureteral catheterization on 11/13 with Dr. Erlene Houston. Plan was to leave ureteral Foley in for 1 week to allow urethral healing with a patent tract.  --Foley removed by Dr. Erlene Houston 11/20 --Suprapubic catheter to remain in place --Outpatient urology follow up as scheduled -Continue Flomax  11/21: Suprapubic catheter leaking around the tube, soaking dressings, gown and linens.  Urology do not think a bowel injury is likely. Per Dr. Erlene Houston, this may be normal and related to bladder spasms.  Most important thing is urine draining through the tube itself.  Seizures (Leslie Houston) Continue IV Keppra until taking PO. Seizure precautions.  CKD (chronic kidney disease) stage 4, GFR 15-29 ml/min (HCC) Remained stable  Partial intestinal obstruction (HCC)-resolved as of  03/22/2022 See partial SBO and surgery's recommendations  Hemiparesis affecting right side as late effect  of cerebrovascular accident (CVA) (HCC)-resolved as of 03/22/2022 History of prior CVA with left-sided hemiparesis Resume aspirin once patient is able to tolerate p.o.        Subjective: Pt was sleeping soundly this AM.  NG tube remains clamped.  No acute complaints or events reported.   Physical Exam: Vitals:   04/02/22 0500 04/02/22 0559 04/02/22 0700 04/02/22 0755  BP:  (!) 131/50 132/65 119/66  Pulse:  65 (!) 59 (!) 57  Resp:  16 (!) 24 18  Temp:  97.7 F (36.5 C) 98.1 F (36.7 C) 97.6 F (36.4 C)  TempSrc:  Oral Oral Oral  SpO2:  96% 99% 97%  Weight: 82.9 kg     Height:       General exam: sleeping comfortably no acute distress HEENT: NG tube in place clamped Respiratory system: on room air, normal respiratory effort. Cardiovascular system: RRR, no pedal edema.   Gastrointestinal system: abdomen remains distended. Central nervous system: A&O x3. no gross focal neurologic deficits, normal speech Extremities: moves all, SCD's on BLE's Skin: dry, intact, normal temperature Psychiatry: normal mood, congruent affect, judgement and insight appear normal    Data Reviewed:  BMP notable for K mildly elevated 5.3, chloride 115, bicarb 17, BUN 55, Cr 2.10 overall stable, albumin 2.5.  CBG's in low 100's, controlled.  KUB today is still pending  Family Communication: None   Disposition: Status is: Inpatient Remains inpatient appropriate because: Persistent SBO.   Discharge will be per general surgery, primary service.    Planned Discharge Destination: Home   DVT prophylaxis-SCDs   Time spent: 25 minutes  Author: Ezekiel Slocumb, DO 04/02/2022 1:09 PM  For on call review [email or web address].

## 2022-04-02 NOTE — Progress Notes (Signed)
Subjective:  CC: Leslie Houston is a 71 y.o. female  Hospital stay day 19, 11 Days Post-Op partial SBO versus ileus  HPI: No issues.  More formed stools multiple times yesterday.  Tolerating clear liquid diet.  ROS:  General: Denies weight loss, weight gain, fatigue, fevers, chills, and night sweats. Heart: Denies chest pain, palpitations, racing heart, irregular heartbeat, leg pain or swelling, and decreased activity tolerance. Respiratory: Denies breathing difficulty, shortness of breath, wheezing, cough, and sputum. GI: Denies change in appetite, heartburn, nausea, vomiting, constipation, diarrhea, and blood in stool. GU: Denies difficulty urinating, pain with urinating, urgency, frequency, blood in urine.   Objective:   Temp:  [97.6 F (36.4 C)-98.4 F (36.9 C)] 97.6 F (36.4 C) (12/04 0755) Pulse Rate:  [57-65] 57 (12/04 0755) Resp:  [16-24] 18 (12/04 0755) BP: (119-134)/(48-66) 119/66 (12/04 0755) SpO2:  [95 %-99 %] 97 % (12/04 0755) Weight:  [82.9 kg] 82.9 kg (12/04 0500)     Height: '5\' 11"'$  (180.3 cm) Weight: 82.9 kg BMI (Calculated): 25.5   Intake/Output this shift:   Intake/Output Summary (Last 24 hours) at 04/02/2022 1450 Last data filed at 04/02/2022 1417 Gross per 24 hour  Intake 2478.46 ml  Output 975 ml  Net 1503.46 ml    Constitutional :  alert, cooperative, appears stated age, and no distress  Respiratory:  clear to auscultation bilaterally  Cardiovascular:  regular rate and rhythm  Gastrointestinal: soft, non-tender; bowel sounds normal; no masses,  no organomegaly.  Improved distention on exam.  NG with mostly clear output in canister  Skin: Cool and moist. Staple line c/d/i  Psychiatric: Normal affect, non-agitated, not confused       LABS:     Latest Ref Rng & Units 04/02/2022    5:30 AM 04/01/2022    3:40 AM 03/31/2022    6:05 AM  CMP  Glucose 70 - 99 mg/dL 96  108  109   BUN 8 - 23 mg/dL 55  52  49   Creatinine 0.44 - 1.00 mg/dL 2.10  2.13   1.96   Sodium 135 - 145 mmol/L 137  140  139   Potassium 3.5 - 5.1 mmol/L 5.3  4.5  4.4   Chloride 98 - 111 mmol/L 115  118  118   CO2 22 - 32 mmol/L '17  18  17   '$ Calcium 8.9 - 10.3 mg/dL 8.9  8.9  8.8   Total Protein 6.5 - 8.1 g/dL 6.3     Total Bilirubin 0.3 - 1.2 mg/dL 0.7     Alkaline Phos 38 - 126 U/L 115     AST 15 - 41 U/L 18     ALT 0 - 44 U/L 20         Latest Ref Rng & Units 04/01/2022    3:40 AM 03/27/2022    5:18 AM 03/25/2022    5:00 AM  CBC  WBC 4.0 - 10.5 K/uL 6.8  6.7  8.8   Hemoglobin 12.0 - 15.0 g/dL 9.1  9.7  10.4   Hematocrit 36.0 - 46.0 % 27.6  30.7  32.6   Platelets 150 - 400 K/uL 211  185  226     RADS: Pending official report this a.m.  Assessment:   S/p ex lap, lysis of adhesions.  Now with postop ileus.    On clear liquid diet since yesterday, multiple bowel movements, abdominal exam improving, NG clamped since yesterday and only 50 mL of clear output when placed back on  suction today.  NG has been removed and will continue on clears for another day to monitor. labs/images/medications/previous chart entries reviewed personally and relevant changes/updates noted above.

## 2022-04-02 NOTE — Progress Notes (Signed)
PHARMACY - TOTAL PARENTERAL NUTRITION CONSULT NOTE   Indication: Prolonged ileus  Patient Measurements: Height: '5\' 11"'$  (180.3 cm) Weight: 82.9 kg (182 lb 12.2 oz) IBW/kg (Calculated) : 70.8   Body mass index is 25.49 kg/m. Usual Weight: 86.8 kg  Assessment:  71 year old female admitted with small bowel obstruction s/p lysis of adhesions. Past medical history includes diabetes mellitus (A1c 4.7% on 12/05/21), CKD4, depression, history of prior CVA, CAD, anxiety. On 11/25, patient pulled out NG tube. Starting TPN for prolonged ileus.  Glucose / Insulin:  CBGs 96-131 in previous 24 hours.   Electrolytes:  Na+ 139>139>140>137 (trending down).  K+ 3.8>4.4>4.5>5.3 (elevated) Cl  117>118>118>115 CO2 18>17>18>17 Phos stable 2.5 > 3.3>3.6>3.9  Renal: Scr 2.1  Hepatic: 12/4 AST/ALT: 18/20, Tbili 0.7  Intake / Output; MIVF:  MIVF D5 @ 25 mL/hr I&O -666 mL Intake: +2.24 L from IV  Output: -2850 mL (urine), 55 mL (NG output)  GI Imaging: --CT scan of abdomen and pelvis: few dilated loops of proximal small bowel within the left hemi abdomen gradually taper to the level of the right lower quadrant, where these bowel loops appear to tether to the uterus in the right adnexa.  --KUB 12/1: stable small bowel dilatation  --12/3: Mild improvement in diffuse small bowel and colonic dilatation,suspicious for ileus.  GI Surgeries / Procedures:  11/23 Exploratory laparotomy   Central access:  PICC double lumen 03/22/22  TPN start date:  03/26/22 03/29/22 advanced to goal rate  Nutritional Goals: Goal TPN rate is 65 mL/hr (provides 54 g/L of protein and 2160 kcals per day)  RD Assessment: Estimated Needs Total Energy Estimated Needs: 2100-2300 Total Protein Estimated Needs: 115-130 grams Total Fluid Estimated Needs: > 2 L Kcal:  2100-2300 Protein:  115-130 grams Fluid:  > 2 L  Current Nutrition:  Clear Liquid  Plan:  --Continue TPN at 65 mL/hr at 1800 (goal rate) --Increase  Na and decrease K --Electrolytes in TPN: Na 4mq/L, K 30 mEq/L, Ca 554m/L, Mg 19m67mL, and Phos 119m61mL. Cl/Ac 1:2 --Add standard MVI and trace elements to TPN --Continue Sensitive q8h SSI and adjust as needed  --Recheck electrolytes tomorrow with AM labs --Monitor TPN labs on Mon/Thurs.    HowaLorin PicketarmD Clinical Pharmacist 04/02/2022

## 2022-04-02 NOTE — Care Management Important Message (Signed)
Important Message  Patient Details  Name: Leslie Houston MRN: [id number] Date of Birth: 1950/09/27   Medicare Important Message Given:  Yes     Juliann Pulse A Paulyne Mooty 04/02/2022, 12:52 PM

## 2022-04-02 NOTE — Progress Notes (Signed)
Nutrition Follow-up  DOCUMENTATION CODES:   Not applicable  INTERVENTION:   -TPN management per pharmacy -Boost Breeze po TID, each supplement provides 250 kcal and 9 grams of protein  -RD to monitor for diet advancement and adjust supplement regimen as appropriate   NUTRITION DIAGNOSIS:   Increased nutrient needs related to post-op healing as evidenced by estimated needs.  Ongoing  GOAL:   Patient will meet greater than or equal to 90% of their needs  Progressing   MONITOR:   Diet advancement  REASON FOR ASSESSMENT:   Consult New TPN/TNA  ASSESSMENT:   Ptm with medical history significant for diabetes mellitus with complications of stage IV chronic kidney disease, depression, history of prior CVA with residual hearing loss, coronary artery disease, anxiety disorder, status post recent suprapubic catheter placement for chronic urinary retention and urethral dilation for stricture with urology on 11/13.  She presented for evaluation of severe abdominal pain with associated with nausea and emesis.  11/23- s/p ex lap with LOA 11/25- NGT placed 11/26- NT pulled out by pt 11/27- refusing NGT replacement, starting TPN today 11/28- NGT reinserted 12/3- advanced to clear liquid diet, NGT clamped  Reviewed I/O's: +164 ml x 24 hours and +7.9 L since 03/19/22  UOP: 2.1 L x 24 hours   Po 0-30%  Per MD notes, KUB shows improvement. No need for emergent surgical interventions. Pt has been advanced to a clear liquid diet with clamping trials. Pt with minimal oral intake; PO 0-30%.   Pt remains on TPN @ 65 ml/hr, which provides 1679 kcals and 84 grams protein, meeting 80% of estimated kcal needs and 73% of estimated protein needs.   Medications reviewed and include colace, reglan, miralax, keppra, and senokot.   Lab Results  Component Value Date   HGBA1C 4.7 12/05/2021   PTA DM medications are .   Labs reviewed: K: 5.3, CBGS: 114-116 (inpatient orders for glycemic control  are 0-9 units insulin aspart every 8 hours).    Diet Order:   Diet Order             Diet clear liquid Room service appropriate? Yes; Fluid consistency: Thin  Diet effective now                   EDUCATION NEEDS:   Education needs have been addressed  Skin:  Skin Assessment: Skin Integrity Issues: Skin Integrity Issues:: Incisions Incisions: closed abdomen and perineum  Last BM:  04/02/22  Height:   Ht Readings from Last 1 Encounters:  03/14/22 '5\' 11"'$  (1.803 m)    Weight:   Wt Readings from Last 1 Encounters:  04/02/22 82.9 kg    Ideal Body Weight:  70.5 kg  BMI:  Body mass index is 25.49 kg/m.  Estimated Nutritional Needs:   Kcal:  2100-2300  Protein:  115-130 grams  Fluid:  > 2 L    Loistine Chance, RD, LDN, [place] Registered Dietitian II Certified Diabetes Care and Education Specialist Please refer to [facility] for RD and/or RD on-call/weekend/after hours pager

## 2022-04-02 NOTE — Evaluation (Signed)
Occupational Therapy Re-Evaluation Patient Details Name: Leslie Houston MRN: [id number] DOB: 03-May-1950 Today's Date: 04/02/2022   History of Present Illness Pt is a 71 y/o F admitted on 03/14/22 after presenting to the ED with c/o severe abdominal pain associated with nausea & emesis. Pt was found to have partial SBO vs ileus. Pt is s/p ex lap on 11/23. PMH: DM, CKD IV, depression, CVA with hearing loss, CAD, anxiety, s/p recent suprapubic catheter placement for chronic urinary retention & urethral dilation for stricture 03/12/22   Clinical Impression   Patient seen for re-evaluation. Chart reviewed to date. Pt received supine in bed and agreeable to OT. Goals continue to be appropriate. Pt required Min A for supine to sit, stood from EOB with Min guard, and required Min guard for step pivot transfer from EOB>recliner with no AD. OT provided set up A for self-feeding at end of session. Pt left sitting in recliner with all needs in reach. Pt continues to benefit from skilled OT to maximize safety and independence.        Recommendations for follow up therapy are one component of a multi-disciplinary discharge planning process, led by the attending physician.  Recommendations may be updated based on patient status, additional functional criteria and insurance authorization.   Follow Up Recommendations  Home health OT     Assistance Recommended at Discharge Intermittent Supervision/Assistance  Patient can return home with the following Assistance with cooking/housework;A little help with bathing/dressing/bathroom    Functional Status Assessment  Patient has had a recent decline in their functional status and demonstrates the ability to make significant improvements in function in a reasonable and predictable amount of time.  Equipment Recommendations  Tub/shower bench    Recommendations for Other Services       Precautions / Restrictions Precautions Precautions: Fall Precaution  Comments: suprapubic cath, NG tube Restrictions Weight Bearing Restrictions: No      Mobility Bed Mobility Overal bed mobility: Needs Assistance Bed Mobility: Supine to Sit     Supine to sit: Min assist     General bed mobility comments: assist for trunk elevation    Transfers Overall transfer level: Needs assistance Equipment used: None Transfers: Sit to/from Stand, Bed to chair/wheelchair/BSC Sit to Stand: Min guard     Step pivot transfers: Min guard     General transfer comment: STS from EOB      Balance Overall balance assessment: Needs assistance Sitting-balance support: Feet supported, Bilateral upper extremity supported Sitting balance-Leahy Scale: Good     Standing balance support: During functional activity, Bilateral upper extremity supported, Reliant on assistive device for balance Standing balance-Leahy Scale: Fair                             ADL either performed or assessed with clinical judgement   ADL Overall ADL's : Needs assistance/impaired Eating/Feeding: Set up;Sitting Eating/Feeding Details (indicate cue type and reason): liquid diet                     Toilet Transfer: Rolling walker (2 wheels);Min guard;Ambulation Toilet Transfer Details (indicate cue type and reason): simulated to bedside chair, short ambulatory transfer                 Vision Baseline Vision/History: 1 Wears glasses Patient Visual Report: No change from baseline       Perception     Praxis      Pertinent Vitals/Pain Pain Assessment Pain  Assessment: Faces Faces Pain Scale: Hurts little more Pain Location: abdominal incision Pain Descriptors / Indicators: Sore Pain Intervention(s): Monitored during session, Repositioned     Hand Dominance     Extremity/Trunk Assessment Upper Extremity Assessment Upper Extremity Assessment: Overall WFL for tasks assessed   Lower Extremity Assessment Lower Extremity Assessment: Overall WFL for  tasks assessed       Communication Communication Communication: HOH   Cognition Arousal/Alertness: Awake/alert Behavior During Therapy: WFL for tasks assessed/performed Overall Cognitive Status: Within Functional Limits for tasks assessed                                       General Comments       Exercises     Shoulder Instructions      Home Living Family/patient expects to be discharged to:: Private residence Living Arrangements: Other relatives Available Help at Discharge: Family Type of Home: Apartment Home Access: Level entry     Home Layout: One level               Home Equipment: Cane - single point;Standard Environmental consultant          Prior Functioning/Environment Prior Level of Function : Independent/Modified Independent             Mobility Comments: SPC use in the home, walker community distances ADLs Comments: MOD I with ADL, assist with IADL as needed        OT Problem List: Decreased activity tolerance      OT Treatment/Interventions: Self-care/ADL training;Patient/family education;Therapeutic exercise;Energy conservation;DME and/or AE instruction;Therapeutic activities    OT Goals(Current goals can be found in the care plan section) Acute Rehab OT Goals Patient Stated Goal: Go home OT Goal Formulation: With patient Time For Goal Achievement: 04/16/22 Potential to Achieve Goals: Good  OT Frequency: Min 2X/week    Co-evaluation              AM-PAC OT "6 Clicks" Daily Activity     Outcome Measure Help from another person eating meals?: None Help from another person taking care of personal grooming?: None Help from another person toileting, which includes using toliet, bedpan, or urinal?: A Little Help from another person bathing (including washing, rinsing, drying)?: A Little Help from another person to put on and taking off regular upper body clothing?: None Help from another person to put on and taking off regular  lower body clothing?: A Little 6 Click Score: 21   End of Session Nurse Communication: Mobility status  Activity Tolerance: Patient tolerated treatment well Patient left: in chair;with chair alarm set;with call bell/phone within reach  OT Visit Diagnosis: Unsteadiness on feet (R26.81)                Time: 5465-6812 OT Time Calculation (min): 22 min Charges:  OT General Charges $OT Visit: 1 Visit OT Evaluation $OT Re-eval: 1 Re-eval  [facility] MS, OTR/L ascom [phone]  04/02/22, 1:17 PM

## 2022-04-03 ENCOUNTER — Inpatient Hospital Stay: Payer: Medicare Other

## 2022-04-03 DIAGNOSIS — R339 Retention of urine, unspecified: Secondary | ICD-10-CM | POA: Diagnosis not present

## 2022-04-03 DIAGNOSIS — N3592 Unspecified urethral stricture, female: Secondary | ICD-10-CM | POA: Diagnosis not present

## 2022-04-03 DIAGNOSIS — K5669 Other partial intestinal obstruction: Secondary | ICD-10-CM | POA: Diagnosis not present

## 2022-04-03 DIAGNOSIS — R1084 Generalized abdominal pain: Secondary | ICD-10-CM | POA: Diagnosis not present

## 2022-04-03 DIAGNOSIS — K56609 Unspecified intestinal obstruction, unspecified as to partial versus complete obstruction: Secondary | ICD-10-CM | POA: Diagnosis not present

## 2022-04-03 LAB — GLUCOSE, CAPILLARY
Glucose-Capillary: 109 mg/dL — ABNORMAL HIGH (ref 70–99)
Glucose-Capillary: 105 mg/dL — ABNORMAL HIGH (ref 70–99)
Glucose-Capillary: 105 mg/dL — ABNORMAL HIGH (ref 70–99)
Glucose-Capillary: 97 mg/dL (ref 70–99)
Glucose-Capillary: 98 mg/dL (ref 70–99)

## 2022-04-03 LAB — BASIC METABOLIC PANEL
Anion gap: 4 — ABNORMAL LOW (ref 5–15)
BUN: 57 mg/dL — ABNORMAL HIGH (ref 8–23)
CO2: 19 mmol/L — ABNORMAL LOW (ref 22–32)
Calcium: 8.5 mg/dL — ABNORMAL LOW (ref 8.9–10.3)
Chloride: 112 mmol/L — ABNORMAL HIGH (ref 98–111)
Creatinine, Ser: 2.27 mg/dL — ABNORMAL HIGH (ref 0.44–1.00)
GFR, Estimated: 22 mL/min — ABNORMAL LOW (ref >60)
Glucose, Bld: 93 mg/dL (ref 70–99)
Potassium: 5.3 mmol/L — ABNORMAL HIGH (ref 3.5–5.1)
Sodium: 135 mmol/L (ref 135–145)

## 2022-04-03 MED ORDER — SODIUM ZIRCONIUM CYCLOSILICATE 5 G PO PACK
5.0000 g | PACK | Freq: Once | ORAL | Status: AC
  Administered 2022-04-03: 5 g via ORAL
  Filled 2022-04-03: qty 1

## 2022-04-03 MED ORDER — TRAVASOL 10 % IV SOLN
INTRAVENOUS | Status: DC
  Filled 2022-04-03: qty 842.4

## 2022-04-03 MED ORDER — TRAVASOL 10 % IV SOLN
INTRAVENOUS | Status: DC
  Filled 2022-04-03: qty 518.4

## 2022-04-03 NOTE — Progress Notes (Signed)
Subjective:  CC: Leslie Houston is a 71 y.o. female  Hospital stay day 20, 12 Days Post-Op partial SBO versus ileus  HPI: No issues.  More formed stools multiple times yesterday.  Tolerating clear liquid diet.  ROS:  General: Denies weight loss, weight gain, fatigue, fevers, chills, and night sweats. Heart: Denies chest pain, palpitations, racing heart, irregular heartbeat, leg pain or swelling, and decreased activity tolerance. Respiratory: Denies breathing difficulty, shortness of breath, wheezing, cough, and sputum. GI: Denies change in appetite, heartburn, nausea, vomiting, constipation, diarrhea, and blood in stool. GU: Denies difficulty urinating, pain with urinating, urgency, frequency, blood in urine.   Objective:   Temp:  [98.1 F (36.7 C)-98.5 F (36.9 C)] 98.1 F (36.7 C) (12/05 0757) Pulse Rate:  [58-60] 60 (12/05 0757) Resp:  [16-18] 18 (12/05 0757) BP: (106-124)/(45-59) 112/55 (12/05 0757) SpO2:  [95 %-98 %] 97 % (12/05 0757)     Height: '5\' 11"'$  (180.3 cm) Weight: 82.9 kg BMI (Calculated): 25.5   Intake/Output this shift:   Intake/Output Summary (Last 24 hours) at 04/03/2022 1132 Last data filed at 04/02/2022 1910 Gross per 24 hour  Intake 240 ml  Output 800 ml  Net -560 ml    Constitutional :  alert, cooperative, appears stated age, and no distress  Respiratory:  clear to auscultation bilaterally  Cardiovascular:  regular rate and rhythm  Gastrointestinal: soft, non-tender; bowel sounds normal; no masses,  no organomegaly.  Stable abdominal exam.  Skin: Cool and moist. Staple line c/d/i  Psychiatric: Normal affect, non-agitated, not confused       LABS:     Latest Ref Rng & Units 04/03/2022    6:45 AM 04/02/2022    5:30 AM 04/01/2022    3:40 AM  CMP  Glucose 70 - 99 mg/dL 93  96  108   BUN 8 - 23 mg/dL 57  55  52   Creatinine 0.44 - 1.00 mg/dL 2.27  2.10  2.13   Sodium 135 - 145 mmol/L 135  137  140   Potassium 3.5 - 5.1 mmol/L 5.3  5.3  4.5    Chloride 98 - 111 mmol/L 112  115  118   CO2 22 - 32 mmol/L '19  17  18   '$ Calcium 8.9 - 10.3 mg/dL 8.5  8.9  8.9   Total Protein 6.5 - 8.1 g/dL  6.3    Total Bilirubin 0.3 - 1.2 mg/dL  0.7    Alkaline Phos 38 - 126 U/L  115    AST 15 - 41 U/L  18    ALT 0 - 44 U/L  20        Latest Ref Rng & Units 04/01/2022    3:40 AM 03/27/2022    5:18 AM 03/25/2022    5:00 AM  CBC  WBC 4.0 - 10.5 K/uL 6.8  6.7  8.8   Hemoglobin 12.0 - 15.0 g/dL 9.1  9.7  10.4   Hematocrit 36.0 - 46.0 % 27.6  30.7  32.6   Platelets 150 - 400 K/uL 211  185  226     RADS: Stable KUB  Assessment:   S/p ex lap, lysis of adhesions.  Now with postop ileus, resolving  Clears for two days now, no change and continues to have multiple BM.  Will advance to full liquid diet and monitor  labs/images/medications/previous chart entries reviewed personally and relevant changes/updates noted above.

## 2022-04-03 NOTE — Assessment & Plan Note (Signed)
BMI 03.79 Complicates overall prognosis and care Lifestyle modification and exercise has been discussed with patient.

## 2022-04-03 NOTE — Progress Notes (Signed)
Physical Therapy Treatment Patient Details Name: Leslie Houston MRN: [id number] DOB: 09-10-1950 Today's Date: 04/03/2022   History of Present Illness Pt is a 71 y/o F admitted on 03/14/22 after presenting to the ED with c/o severe abdominal pain associated with nausea & emesis. Pt was found to have partial SBO vs ileus. Pt is s/p ex lap on 11/23. PMH: DM, CKD IV, depression, CVA with hearing loss, CAD, anxiety, s/p recent suprapubic catheter placement for chronic urinary retention & urethral dilation for stricture 03/12/22    PT Comments    Patient agreeable to PT tx session with max encouragement provided to participate. Patient ambulated 75' with supervision and RW. 3/4 DOE at end of session. Patient declining exercises at end of ambulation. Encouraged continued mobility with nursing staff and mobility specialists. D/c plan remains appropriate.     Recommendations for follow up therapy are one component of a multi-disciplinary discharge planning process, led by the attending physician.  Recommendations may be updated based on patient status, additional functional criteria and insurance authorization.  Follow Up Recommendations  Home health PT     Assistance Recommended at Discharge Intermittent Supervision/Assistance  Patient can return home with the following A little help with walking and/or transfers;A little help with bathing/dressing/bathroom;Assistance with cooking/housework;Help with stairs or ramp for entrance   Equipment Recommendations  Rolling Audrena Talaga (2 wheels)    Recommendations for Other Services       Precautions / Restrictions Precautions Precautions: Fall Precaution Comments: suprapubic cath (clamped) Restrictions Weight Bearing Restrictions: No     Mobility  Bed Mobility Overal bed mobility: Needs Assistance Bed Mobility: Supine to Sit, Sit to Supine     Supine to sit: Supervision Sit to supine: Supervision   General bed mobility comments: use of bed  rails to assist    Transfers Overall transfer level: Needs assistance Equipment used: Rolling Corisa Montini (2 wheels) Transfers: Sit to/from Stand Sit to Stand: Supervision                Ambulation/Gait Ambulation/Gait assistance: Supervision Gait Distance (Feet): 360 Feet Assistive device: Rolling Anaston Koehn (2 wheels) Gait Pattern/deviations: Step-through pattern, Decreased stride length Gait velocity: WFL         Stairs             Wheelchair Mobility    Modified Rankin (Stroke Patients Only)       Balance Overall balance assessment: Needs assistance Sitting-balance support: Feet supported, Bilateral upper extremity supported Sitting balance-Leahy Scale: Good     Standing balance support: During functional activity, Bilateral upper extremity supported, Reliant on assistive device for balance Standing balance-Leahy Scale: Fair                              Cognition Arousal/Alertness: Awake/alert Behavior During Therapy: WFL for tasks assessed/performed Overall Cognitive Status: Within Functional Limits for tasks assessed                                          Exercises      General Comments        Pertinent Vitals/Pain Pain Assessment Pain Assessment: Faces Faces Pain Scale: Hurts a little bit Pain Location: abdominal incision Pain Descriptors / Indicators: Sore Pain Intervention(s): Monitored during session    Home Living  Prior Function            PT Goals (current goals can now be found in the care plan section) Acute Rehab PT Goals PT Goal Formulation: With patient Time For Goal Achievement: 04/08/22 Potential to Achieve Goals: Good Progress towards PT goals: Progressing toward goals    Frequency    Min 2X/week      PT Plan Current plan remains appropriate    Co-evaluation              AM-PAC PT "6 Clicks" Mobility   Outcome Measure  Help needed  turning from your back to your side while in a flat bed without using bedrails?: A Little Help needed moving from lying on your back to sitting on the side of a flat bed without using bedrails?: A Little Help needed moving to and from a bed to a chair (including a wheelchair)?: A Little Help needed standing up from a chair using your arms (e.g., wheelchair or bedside chair)?: A Little Help needed to walk in hospital room?: A Little Help needed climbing 3-5 steps with a railing? : A Little 6 Click Score: 18    End of Session   Activity Tolerance: Patient tolerated treatment well Patient left: in bed;with call bell/phone within reach Nurse Communication: Mobility status PT Visit Diagnosis: Difficulty in walking, not elsewhere classified (R26.2);Unsteadiness on feet (R26.81)     Time: 2751-7001 PT Time Calculation (min) (ACUTE ONLY): 24 min  Charges:  $Therapeutic Activity: 23-37 mins                     Claudett Bayly A. Gilford Rile PT, DPT [place] A Santiel Topper 04/03/2022, 4:12 PM

## 2022-04-03 NOTE — Progress Notes (Signed)
Patient has voided spontaneously today, PVR 54m per nursing. Will keep suprapubic catheter plugged for now and plan to watch her closely with her history of chronic retention and hydronephrosis. Orders placed for PVR Qshift.

## 2022-04-03 NOTE — Assessment & Plan Note (Addendum)
Status post recent suprapubic and ureteral catheterization on 11/13 with Dr. Erlene Quan. Plan was to leave ureteral Foley in for 1 week to allow urethral healing with a patent tract. 11/21: Suprapubic catheter leaking around the tube, soaking dressings, gown and linens.  Urology do not think a bowel injury is likely. Per Dr. Erlene Quan, this may be normal and related to bladder spasms.  Most important thing is urine draining through the tube itself. 12/5: Urology capped SP catheter to see if patient can void following urethral stricture dilation done prior to admission. --Foley removed by Dr. Erlene Quan 11/20 --Suprapubic catheter to remain in place --Outpatient urology follow up as scheduled --Continue Flomax

## 2022-04-03 NOTE — Progress Notes (Signed)
Urology Inpatient Progress Note  Subjective: No acute events overnight.  She is afebrile, VSS. Creatinine has fluctuated over the past week between 2.0 and 2.3. SPT in place draining yellow urine.  Anti-infectives: Anti-infectives (From admission, onward)    Start     Dose/Rate Route Frequency Ordered Stop   03/22/22 1919  ceFAZolin (ANCEF) 2-4 GM/100ML-% IVPB       Note to Pharmacy: Caralee Ates L: cabinet override      03/22/22 1919 03/22/22 1933   03/22/22 1833  ceFAZolin (ANCEF) IVPB 2g/100 mL premix       Note to Pharmacy: To be given preop. Cast start at 730pm tonight   2 g 200 mL/hr over 30 Minutes Intravenous 30 min pre-op 03/22/22 1830 03/22/22 1955   03/15/22 1000  ceFEPIme (MAXIPIME) 2 g in sodium chloride 0.9 % 100 mL IVPB  Status:  Discontinued        2 g 200 mL/hr over 30 Minutes Intravenous Every 24 hours 03/14/22 1301 03/15/22 0915   03/14/22 0815  vancomycin (VANCOCIN) IVPB 1000 mg/200 mL premix        1,000 mg 200 mL/hr over 60 Minutes Intravenous  Once 03/14/22 0810 03/14/22 0950   03/14/22 0815  ceFEPIme (MAXIPIME) 2 g in sodium chloride 0.9 % 100 mL IVPB        2 g 200 mL/hr over 30 Minutes Intravenous  Once 03/14/22 0810 03/14/22 0925       Current Facility-Administered Medications  Medication Dose Route Frequency Provider Last Rate Last Admin   acetaminophen (TYLENOL) tablet 650 mg  650 mg Oral Q6H PRN Agbata, Tochukwu, MD   650 mg at 03/15/22 0043   Chlorhexidine Gluconate Cloth 2 % PADS 6 each  6 each Topical Q0600 Agbata, Tochukwu, MD   6 each at 04/03/22 0737   dextrose 5 % solution   Intravenous Continuous Wynelle Cleveland, RPH 25 mL/hr at 04/02/22 1730 New Bag at 04/02/22 1730   docusate sodium (COLACE) capsule 100 mg  100 mg Oral BID Lysle Pearl, Isami, DO   100 mg at 04/03/22 0901   feeding supplement (BOOST / RESOURCE BREEZE) liquid 1 Container  1 Container Oral TID BM Nicole Kindred A, DO   1 Container at 04/02/22 2019   hydrALAZINE (APRESOLINE)  injection 10 mg  10 mg Intravenous Q4H PRN Nicole Kindred A, DO   10 mg at 03/17/22 1818   HYDROmorphone (DILAUDID) injection 0.5 mg  0.5 mg Intravenous Q4H PRN Agbata, Tochukwu, MD   0.5 mg at 03/15/22 0418   insulin aspart (novoLOG) injection 0-9 Units  0-9 Units Subcutaneous Q8H Chinita Greenland A, RPH   1 Units at 04/01/22 2240   iohexol (OMNIPAQUE) 9 MG/ML oral solution 500 mL  500 mL Oral Once PRN Tylene Fantasia, PA-C       levETIRAcetam (KEPPRA) IVPB 1000 mg/100 mL premix  1,000 mg Intravenous BID Agbata, Tochukwu, MD 400 mL/hr at 04/03/22 0905 1,000 mg at 04/03/22 0905   metoCLOPramide (REGLAN) injection 5 mg  5 mg Intravenous Q6H Sakai, Isami, DO   5 mg at 04/03/22 0610   ondansetron (ZOFRAN) tablet 4 mg  4 mg Oral Q6H PRN Agbata, Tochukwu, MD       Or   ondansetron (ZOFRAN) injection 4 mg  4 mg Intravenous Q6H PRN Agbata, Tochukwu, MD   4 mg at 04/02/22 0555   pantoprazole (PROTONIX) injection 40 mg  40 mg Intravenous Q24H Agbata, Tochukwu, MD   40 mg at 04/02/22 1316  phenol (CHLORASEPTIC) mouth spray 1 spray  1 spray Mouth/Throat PRN Rometta Emery, RN       polyethylene glycol (MIRALAX / GLYCOLAX) packet 17 g  17 g Oral BID Sakai, Isami, DO   17 g at 04/03/22 0859   prochlorperazine (COMPAZINE) injection 10 mg  10 mg Intravenous Q6H PRN Nicole Kindred A, DO   10 mg at 03/20/22 1944   senna-docusate (Senokot-S) tablet 1 tablet  1 tablet Oral BID Lysle Pearl, Isami, DO   1 tablet at 04/03/22 0901   simethicone (MYLICON) chewable tablet 80 mg  80 mg Oral QID Sakai, Isami, DO   80 mg at 04/03/22 0901   sodium chloride flush (NS) 0.9 % injection 10-40 mL  10-40 mL Intracatheter Q12H Max Sane, MD   10 mL at 04/03/22 0905   sodium chloride flush (NS) 0.9 % injection 10-40 mL  10-40 mL Intracatheter PRN Max Sane, MD       tamsulosin [facility]) capsule 0.4 mg  0.4 mg Oral Daily Agbata, Tochukwu, MD   0.4 mg at 04/03/22 0901   TPN ADULT (ION)   Intravenous Continuous TPN Lorin Picket, RPH 65 mL/hr at 04/02/22 1734 New Bag at 04/02/22 1734   TPN ADULT (ION)   Intravenous Continuous TPN Lorin Picket, [facility]       Objective: Vital signs in last 24 hours: Temp:  [98.1 F (36.7 C)-98.5 F (36.9 C)] 98.1 F (36.7 C) (12/05 0757) Pulse Rate:  [58-60] 60 (12/05 0757) Resp:  [16-18] 18 (12/05 0757) BP: (106-124)/(45-59) 112/55 (12/05 0757) SpO2:  [95 %-98 %] 97 % (12/05 0757)  Intake/Output from previous day: 12/04 0701 - 12/05 0700 In: 240 [P.O.:240] Out: 800 [Urine:800] Intake/Output this shift: No intake/output data recorded.  Physical Exam Vitals and nursing note reviewed.  Constitutional:      General: She is not in acute distress.    Appearance: She is not ill-appearing, toxic-appearing or diaphoretic.  HENT:     Head: Normocephalic and atraumatic.  Pulmonary:     Effort: Pulmonary effort is normal. No respiratory distress.  Skin:    General: Skin is warm and dry.  Neurological:     Mental Status: She is alert and oriented to person, place, and time.  Psychiatric:        Mood and Affect: Mood normal.        Behavior: Behavior normal.    Lab Results:  Recent Labs    04/01/22 0340  WBC 6.8  HGB 9.1*  HCT 27.6*  PLT 211   BMET Recent Labs    04/02/22 0530 04/03/22 0645  NA 137 135  K 5.3* 5.3*  CL 115* 112*  CO2 17* 19*  GLUCOSE 96 93  BUN 55* 57*  CREATININE 2.10* 2.27*  CALCIUM 8.9 8.5*   Assessment & Plan: Of any 71-year-old female with chronic urinary retention and urethral stricture s/p pelvic exam under anesthesia with suprapubic catheter placement and urethral dilation with Dr. Erlene Quan on 03/12/2022 now with a prolonged admission with partial SBO versus ileus.  Appreciate ongoing management of partial SBO versus ileus by general surgery.  I plugged her suprapubic catheter at the bedside this morning.  Will plan to see if she can spontaneously void following urethral stricture dilation.  I have placed orders for postvoid  bladder scan no sooner than 1 PM today.  Suprapubic catheter will remain in place.  Will continue to monitor.  Debroah Loop, PA-C 04/03/2022

## 2022-04-03 NOTE — Progress Notes (Addendum)
PHARMACY - TOTAL PARENTERAL NUTRITION CONSULT NOTE   Indication: Prolonged ileus  Patient Measurements: Height: '5\' 11"'$  (180.3 cm) Weight: 82.9 kg (182 lb 12.2 oz) IBW/kg (Calculated) : 70.8   Body mass index is 25.49 kg/m. Usual Weight: 86.8 kg  Assessment:  71 year old female admitted with small bowel obstruction s/p lysis of adhesions. Past medical history includes diabetes mellitus (A1c 4.7% on 12/05/21), CKD4, depression, history of prior CVA, CAD, anxiety. On 11/25, patient pulled out NG tube. Starting TPN for prolonged ileus.  12/5:  Provider wants to begin weaning TPN, with plan to hopefully stop TPN tomorrow if patient tolerates diet.  Glucose / Insulin:  CBGs 96-131 in previous 24 hours.   Electrolytes:  Na+ 139>140>137>135 (trending down).  K+ 4.4>4.5>5.3>5.3 (elevated) Cl  117>118>118>115 CO2 117>18>17>19 Phos stable 2.5 > 3.3>3.6>3.9  Renal: Scr 2.1>2.27 (trending upward)  Hepatic: 12/4 AST/ALT: 18/20, Tbili 0.7  Intake / Output; MIVF:  MIVF D5 @ 25 mL/hr I&O -666 mL Intake: +2.24 L from IV  Output: -2850 mL (urine), 55 mL (NG output)  GI Imaging: --CT scan of abdomen and pelvis: few dilated loops of proximal small bowel within the left hemi abdomen gradually taper to the level of the right lower quadrant, where these bowel loops appear to tether to the uterus in the right adnexa.  --KUB 12/1: stable small bowel dilatation  --12/3: Mild improvement in diffuse small bowel and colonic dilatation,suspicious for ileus.  GI Surgeries / Procedures:  11/23 Exploratory laparotomy   Central access:  PICC double lumen 03/22/22  TPN start date:  03/26/22 03/29/22 advanced to goal rate  Nutritional Goals: Goal TPN rate is 65 mL/hr (provides 54 g/L of protein and 2160 kcals per day)  RD Assessment: Estimated Needs Total Energy Estimated Needs: 2100-2300 Total Protein Estimated Needs: 115-130 grams Total Fluid Estimated Needs: > 2 L Kcal:  2100-2300 Protein:   115-130 grams Fluid:  > 2 L  Current Nutrition:  Full Liquid Diet  Plan:  --Decrease TPN to 40 mL/hr at 1800 (goal rate) --Increase Na and decrease K --Electrolytes in TPN: Na 70 mEq/L, K 20 mEq/L, Ca 76mq/L, Mg 548m/L, and Phos 1554m/L. Cl/Ac 1:2 --Add standard MVI and trace elements to TPN --Remove chromium due to worsening renal function --Continue Sensitive q8h SSI and adjust as needed  --Recheck electrolytes tomorrow with AM labs --Monitor TPN labs on Mon/Thurs.    HowLorin PicketharmD Clinical Pharmacist 04/03/2022

## 2022-04-03 NOTE — Assessment & Plan Note (Signed)
Potassium was slight improvement at 5.2.  Recheck labs this afternoon.  If still elevated, Lokelma

## 2022-04-03 NOTE — Assessment & Plan Note (Signed)
Continue IV Keppra until taking PO. Seizure precautions.

## 2022-04-03 NOTE — Progress Notes (Signed)
Progress Note   Patient: Leslie Houston LKJ:[id number] DOB: [birth year] DOA: 03/14/2022     20 DOS: the patient was seen and examined on 04/03/2022   Brief hospital course: Leslie Houston is a 71 y.o. 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  She presented to the ED via EMS on 03/14/2022 for evaluation of severe abdominal pain with associated with nausea and emesis.   Patient was found to have partial SBO vs ileus. She was started on empiric IV antibiotics due to concern for UTI.    Urology and General Surgery are consulted.   11/22: CT abdomen/pelvis with p.o. contrast today per surgery 11/23: CT - small bowel obstruction.  Exploratory laparotomy and lysis of additions 11/24: Postop day 1, attending service changed to surgery. 11/25: NG tube in place 11/26: Patient pulled out NG tube last night and surgical team recommends to leave it out as patient refused 11/27: Patient refusing NG tube, starting TPN 11/28: NG tube reinserted.  KUB shows persistent ileus 11/29: NG remains in place.  Surgery repeating CT scan. 11/30 >> 12/2: Persistent post-op ilues.  Started on reglan and increased stool softeners.  KUB ?slight improvement    Assessment and Plan: * Small bowel obstruction ([place]) Presented with diffuse abdominal pain, N/V and constipation.  CT scan of abdomen and pelvis showed a few dilated loops of proximal small bowel within the left hemi abdomen gradually taper to the level of the right lower quadrant, where these bowel loops appear to tether to the uterus in the right adnexa.  Findings are suggestive of ileus versus partial small bowel obstruction.  11/18 >> 21  --patient continues to have recurrent obstructive symptoms when resumed on any p.o. intake, refuses to have NG tube replaced 11/22 - patient vomited 300 cc total of yellow/green material this morning.  CT scan showed High-grade mechanical small bowel obstruction, Potential transition point in the mid abdominal central mesentery 11/23 -underwent exploratory laparotomy lysis of adhesions by Dr. Lysle Pearl 11/24: Postop recovery 11/25: Patient pulled out NG tube 11/26: Waiting for bowel function return 11/27: Started TPN 11/28: NG tube reinserted, KUB shows persistent ileus 11/29>>12/2: as above, persistent ileus. Reglan added. 12/3: NG clamp trial, mild improvement on KUB 12/4: NG tube removed 12/5: tolerating full liquids  UTI (urinary tract infection)-resolved as of 03/27/2022 Versus abnormal UA due to colonization and recent instrumentation.  UTI ruled out  Hypernatremia Resolved with D5W.   Monitor BMP.  Obesity (BMI 30-39.9) BMI 79.48 Complicates overall prognosis and care Lifestyle modification and exercise has been discussed with patient.  Urinary retention Status post recent suprapubic and ureteral catheterization on 11/13 with Dr. Erlene Quan. Plan was to leave ureteral Foley in for 1 week to allow urethral healing with a patent tract. 11/21: Suprapubic catheter leaking around the tube, soaking dressings, gown and linens.  Urology do not think a bowel injury is likely. Per Dr. Erlene Quan, this may be normal and related to bladder spasms.  Most important thing is urine draining through the tube itself. 12/5: Urology capped SP catheter to see if patient can void following urethral stricture dilation done prior to admission. --Foley removed by Dr. Erlene Quan 11/20 --Suprapubic catheter to remain in place --Outpatient urology follow up as scheduled --Continue Flomax  Hyperkalemia K 5.3  yesterday and today.  Pt is having BM's now.  5g Lokelma today. Repeat BMP in AM.  Seizures  ([place]) Continue IV Keppra until taking PO. Seizure precautions.  CKD (chronic kidney disease) stage 4, GFR 15-29 ml/min (HCC) Cr improved, but last couple days has trended up slightly.  Seems near her baseline. Monitor BMP.  Partial intestinal obstruction (HCC)-resolved as of 03/22/2022 See partial SBO and surgery's recommendations  Hemiparesis affecting right side as late effect of cerebrovascular accident (CVA) (HCC)-resolved as of 03/22/2022 History of prior CVA with left-sided hemiparesis Resume aspirin once patient is able to tolerate p.o.        Subjective: Pt was awake sitting up in bed seen this morning.  NG tube was removed yesterday and she reports doing well tolerating full liquid diet so far.  Also reports sensation of needing to void.  She is encouraged by her progress last day or 2.  Hopes to go home soon.  No nausea or vomiting.  Is having bowel movements.    Physical Exam: Vitals:   04/02/22 1521 04/02/22 2001 04/03/22 0449 04/03/22 0757  BP: (!) 124/59 (!) 107/51 (!) 106/45 (!) 112/55  Pulse: (!) 58 (!) 58 (!) 59 60  Resp: '18 16 18 18  '$ Temp: 98.4 F (36.9 C) 98.5 F (36.9 C) 98.2 F (36.8 C) 98.1 F (36.7 C)  TempSrc: Oral  Oral   SpO2: 98% 95% 97% 97%  Weight:      Height:       General exam: Awake and alert, no acute distress HEENT: NG tube has been removed Respiratory system: on room air, normal respiratory effort.  Lungs clear with diminished bases no wheezes or rhonchi heard Cardiovascular system: RRR, no pedal edema.   Gastrointestinal system: Abdomen is softer, nontender, SP catheter remains in place and capped. Central nervous system: A&O x3. no gross focal neurologic deficits, normal speech Extremities: moves all, SCD's on BLE's Skin: dry, intact, normal temperature Psychiatry: normal mood, congruent affect, judgement and insight appear normal    Data Reviewed:  BMP notable for K persistent mildly elevated 5.3, Cl 112, bicarb 19, gap 4, Cr  2.27 from 2.10  KUB this morning showed stable small bowel dilation suggesting persistent ileus  Family Communication: None   Disposition: Status is: Inpatient Remains inpatient appropriate because: Persistent SBO.   Discharge will be per general surgery, primary service.    Planned Discharge Destination: Home   DVT prophylaxis-SCDs   Time spent: 25 minutes  Author: Ezekiel Slocumb, DO 04/03/2022 12:35 PM  For on call review [email or web address].

## 2022-04-03 NOTE — Assessment & Plan Note (Signed)
Cr improved, but last couple days has trended up slightly.  Seems near her baseline. Monitor BMP.

## 2022-04-03 NOTE — Progress Notes (Signed)
Occupational Therapy Treatment Patient Details Name: Leslie Houston MRN: [id number] DOB: 1950/05/10 Today's Date: 04/03/2022   History of present illness Pt is a 71 y/o F admitted on 03/14/22 after presenting to the ED with c/o severe abdominal pain associated with nausea & emesis. Pt was found to have partial SBO vs ileus. Pt is s/p ex lap on 11/23. PMH: DM, CKD IV, depression, CVA with hearing loss, CAD, anxiety, s/p recent suprapubic catheter placement for chronic urinary retention & urethral dilation for stricture 03/12/22   OT comments  Upon entering session, pt resting in bed. Agreeable to OT with encouragement. Pt deferred grooming tasks this date. She engaged in seated BUE/LE AROM exercises (see details below), tolerated well. Pt took several lateral steps toward [place] before returning to supine. Pt left as received with all needs in reach. Pt is making progress toward goal completion. D/C recommendation remains appropriate. OT will continue to follow acutely.    Recommendations for follow up therapy are one component of a multi-disciplinary discharge planning process, led by the attending physician.  Recommendations may be updated based on patient status, additional functional criteria and insurance authorization.    Follow Up Recommendations  Home health OT     Assistance Recommended at Discharge Intermittent Supervision/Assistance  Patient can return home with the following  Assistance with cooking/housework;A little help with bathing/dressing/bathroom;A little help with walking and/or transfers;Assist for transportation;Help with stairs or ramp for entrance   Equipment Recommendations  Tub/shower bench    Recommendations for Other Services      Precautions / Restrictions Precautions Precautions: Fall Precaution Comments: suprapubic cath (clamped) Restrictions Weight Bearing Restrictions: No       Mobility Bed Mobility Overal bed mobility: Needs Assistance Bed  Mobility: Supine to Sit, Sit to Supine     Supine to sit: Supervision Sit to supine: Supervision   General bed mobility comments: use of bed rails to assist    Transfers Overall transfer level: Needs assistance Equipment used: Rolling walker (2 wheels) Transfers: Sit to/from Stand Sit to Stand: Supervision           General transfer comment: STS from EOB     Balance Overall balance assessment: Needs assistance Sitting-balance support: Feet supported, Bilateral upper extremity supported Sitting balance-Leahy Scale: Good     Standing balance support: During functional activity, Bilateral upper extremity supported, Reliant on assistive device for balance Standing balance-Leahy Scale: Fair                             ADL either performed or assessed with clinical judgement   ADL Overall ADL's : Needs assistance/impaired                                     Functional mobility during ADLs: Supervision/safety;Rolling walker (2 wheels) (to take several lateral steps toward [facility]) General ADL Comments: pt deferred grooming tasks this date    Extremity/Trunk Assessment Upper Extremity Assessment Upper Extremity Assessment: Overall WFL for tasks assessed   Lower Extremity Assessment Lower Extremity Assessment: Overall WFL for tasks assessed        Vision Baseline Vision/History: 1 Wears glasses Patient Visual Report: No change from baseline     Perception     Praxis      Cognition Arousal/Alertness: Awake/alert Behavior During Therapy: WFL for tasks assessed/performed Overall Cognitive Status: Within Functional Limits for tasks assessed  Exercises General Exercises - Upper Extremity Shoulder Flexion: AROM, Both, 10 reps, Seated Elbow Flexion: AROM, Both, 10 reps, Seated Elbow Extension: AROM, Both, 10 reps, Seated General Exercises - Lower Extremity Hip ABduction/ADduction:  AROM, Both, 10 reps, Seated Hip Flexion/Marching: AROM, Both, 10 reps, Seated Toe Raises: AROM, Both, 10 reps, Seated Heel Raises: AROM, Both, 10 reps, Seated    Shoulder Instructions       General Comments      Pertinent Vitals/ Pain       Pain Assessment Pain Assessment: Faces Faces Pain Scale: Hurts a little bit Pain Location: abdominal incision Pain Descriptors / Indicators: Sore Pain Intervention(s): Monitored during session, Repositioned  Home Living                                          Prior Functioning/Environment              Frequency  Min 2X/week        Progress Toward Goals  OT Goals(current goals can now be found in the care plan section)  Progress towards OT goals: Progressing toward goals  Acute Rehab OT Goals Patient Stated Goal: go home OT Goal Formulation: With patient Time For Goal Achievement: 04/16/22 Potential to Achieve Goals: Good  Plan Frequency remains appropriate;Discharge plan remains appropriate    Co-evaluation                 AM-PAC OT "6 Clicks" Daily Activity     Outcome Measure   Help from another person eating meals?: None Help from another person taking care of personal grooming?: None Help from another person toileting, which includes using toliet, bedpan, or urinal?: A Little Help from another person bathing (including washing, rinsing, drying)?: A Little Help from another person to put on and taking off regular upper body clothing?: None Help from another person to put on and taking off regular lower body clothing?: A Little 6 Click Score: 21    End of Session Equipment Utilized During Treatment: Rolling walker (2 wheels);Gait belt  OT Visit Diagnosis: Unsteadiness on feet (R26.81)   Activity Tolerance Patient tolerated treatment well   Patient Left in bed;with call bell/phone within reach;with bed alarm set   Nurse Communication Mobility status        Time: 1638-4665 OT  Time Calculation (min): 12 min  Charges: OT General Charges $OT Visit: 1 Visit OT Treatments $Self Care/Home Management : 8-22 mins  [facility] MS, OTR/L ascom [phone]  04/03/22, 5:04 PM

## 2022-04-03 NOTE — Assessment & Plan Note (Signed)
Resolved with D5W.   Monitor BMP.

## 2022-04-04 DIAGNOSIS — K56609 Unspecified intestinal obstruction, unspecified as to partial versus complete obstruction: Secondary | ICD-10-CM | POA: Diagnosis not present

## 2022-04-04 DIAGNOSIS — E875 Hyperkalemia: Secondary | ICD-10-CM

## 2022-04-04 DIAGNOSIS — R339 Retention of urine, unspecified: Secondary | ICD-10-CM | POA: Diagnosis not present

## 2022-04-04 DIAGNOSIS — K5669 Other partial intestinal obstruction: Secondary | ICD-10-CM | POA: Diagnosis not present

## 2022-04-04 DIAGNOSIS — N184 Chronic kidney disease, stage 4 (severe): Secondary | ICD-10-CM | POA: Diagnosis not present

## 2022-04-04 DIAGNOSIS — R1084 Generalized abdominal pain: Secondary | ICD-10-CM | POA: Diagnosis not present

## 2022-04-04 LAB — BASIC METABOLIC PANEL
Anion gap: 7 (ref 5–15)
BUN: 62 mg/dL — ABNORMAL HIGH (ref 8–23)
CO2: 16 mmol/L — ABNORMAL LOW (ref 22–32)
Calcium: 8.4 mg/dL — ABNORMAL LOW (ref 8.9–10.3)
Chloride: 110 mmol/L (ref 98–111)
Creatinine, Ser: 2.35 mg/dL — ABNORMAL HIGH (ref 0.44–1.00)
GFR, Estimated: 21 mL/min — ABNORMAL LOW (ref >60)
Glucose, Bld: 107 mg/dL — ABNORMAL HIGH (ref 70–99)
Potassium: 5.2 mmol/L — ABNORMAL HIGH (ref 3.5–5.1)
Sodium: 133 mmol/L — ABNORMAL LOW (ref 135–145)

## 2022-04-04 LAB — GLUCOSE, CAPILLARY
Glucose-Capillary: 95 mg/dL (ref 70–99)
Glucose-Capillary: 120 mg/dL — ABNORMAL HIGH (ref 70–99)
Glucose-Capillary: 110 mg/dL — ABNORMAL HIGH (ref 70–99)
Glucose-Capillary: 108 mg/dL — ABNORMAL HIGH (ref 70–99)
Glucose-Capillary: 105 mg/dL — ABNORMAL HIGH (ref 70–99)

## 2022-04-04 LAB — PHOSPHORUS: Phosphorus: 4.2 mg/dL (ref 2.5–4.6)

## 2022-04-04 LAB — MAGNESIUM: Magnesium: 2.4 mg/dL (ref 1.7–2.4)

## 2022-04-04 MED ORDER — TRAVASOL 10 % IV SOLN
INTRAVENOUS | Status: AC
  Filled 2022-04-04: qty 324

## 2022-04-04 MED ORDER — SENNOSIDES-DOCUSATE SODIUM 8.6-50 MG PO TABS: 1.0000 | ORAL_TABLET | Freq: Every evening | ORAL | Status: DC | PRN

## 2022-04-04 MED ORDER — ADULT MULTIVITAMIN W/MINERALS CH
1.0000 | ORAL_TABLET | Freq: Every day | ORAL | Status: DC
  Administered 2022-04-05: 1 via ORAL
  Filled 2022-04-04: qty 1

## 2022-04-04 MED ORDER — POLYETHYLENE GLYCOL 3350 17 G PO PACK
17.0000 g | PACK | Freq: Every day | ORAL | Status: DC
  Administered 2022-04-04: 17 g via ORAL
  Filled 2022-04-04: qty 1

## 2022-04-04 MED ORDER — ENSURE ENLIVE PO LIQD
237.0000 mL | Freq: Two times a day (BID) | ORAL | Status: DC
  Administered 2022-04-04 – 2022-04-05 (×3): 237 mL via ORAL

## 2022-04-04 NOTE — Progress Notes (Signed)
Physical Therapy Treatment Patient Details Name: Leslie Houston MRN: [id number] DOB: 04/04/1951 Today's Date: 04/04/2022   History of Present Illness Pt is a 71 y/o F admitted on 03/14/22 after presenting to the ED with c/o severe abdominal pain associated with nausea & emesis. Pt was found to have partial SBO vs ileus. Pt is s/p ex lap on 11/23. PMH: DM, CKD IV, depression, CVA with hearing loss, CAD, anxiety, s/p recent suprapubic catheter placement for chronic urinary retention & urethral dilation for stricture 03/12/22    PT Comments    Pt was long sitting in bed upon arriving. Supportive daughter present. Pt is progressing well and did not need physical assistance to exit bed, stand, or ambulate 2 laps in hallway. Pt ambulated with IV pole x 50 ft prior to ambulating 350 ft without UE support. Discussed that pt no longer needs skilled PT intervention and that pt is to continue to walk and perform routine daily mobility to return to PLOF. Will refer to mobility specialist team. Daughter states understanding. Acute PT to sign off.    Recommendations for follow up therapy are one component of a multi-disciplinary discharge planning process, led by the attending physician.  Recommendations may be updated based on patient status, additional functional criteria and insurance authorization.  Follow Up Recommendations  No PT follow up     Assistance Recommended at Discharge Intermittent Supervision/Assistance  Patient can return home with the following A little help with walking and/or transfers;A little help with bathing/dressing/bathroom;Assistance with cooking/housework;Help with stairs or ramp for entrance   Equipment Recommendations  Rolling walker (2 wheels)       Precautions / Restrictions Precautions Precautions: Fall Restrictions Weight Bearing Restrictions: No     Mobility  Bed Mobility Overal bed mobility: Modified Independent Bed Mobility: Supine to Sit, Sit to Supine   General bed mobility comments: No physical assistance to exit bed or return to bed after OOB activity    Transfers Overall transfer level: Needs assistance Equipment used: Rolling walker (2 wheels) Transfers: Sit to/from Stand Sit to Stand: Supervision  General transfer comment: safely able to stand from lowest bed height and sit with good eccentric control.    Ambulation/Gait Ambulation/Gait assistance: Supervision Gait Distance (Feet): 400 Feet Assistive device: IV Pole, None Gait Pattern/deviations: Step-through pattern Gait velocity: WFL     General Gait Details: 2 laps on unit without use of AD.    Balance Overall balance assessment: Needs assistance Sitting-balance support: Feet supported, Bilateral upper extremity supported Sitting balance-Leahy Scale: Good     Standing balance support: During functional activity, Bilateral upper extremity supported, Reliant on assistive device for balance Standing balance-Leahy Scale: Fair      Cognition Arousal/Alertness: Awake/alert Behavior During Therapy: WFL for tasks assessed/performed Overall Cognitive Status: Within Functional Limits for tasks assessed    General Comments: Pleasant and cooperative.               Pertinent Vitals/Pain Pain Assessment Pain Assessment: 0-10 Pain Score: 0-No pain     PT Goals (current goals can now be found in the care plan section) Acute Rehab PT Goals Patient Stated Goal: home Progress towards PT goals: Progressing toward goals    Frequency    Other (Comment)      PT Plan Discharge plan needs to be updated;Frequency needs to be updated;Other (comment) (DC from PT 2/2 to non skilled intervention. Pt doing to well)       AM-PAC PT "6 Clicks" Mobility   Outcome Measure  Help needed turning from your back to your side while in a flat bed without using bedrails?: A Little Help needed moving from lying on your back to sitting on the side of a flat bed without using  bedrails?: A Little Help needed moving to and from a bed to a chair (including a wheelchair)?: A Little Help needed standing up from a chair using your arms (e.g., wheelchair or bedside chair)?: A Little Help needed to walk in hospital room?: A Little Help needed climbing 3-5 steps with a railing? : A Little 6 Click Score: 18    End of Session   Activity Tolerance: Patient tolerated treatment well Patient left: in bed;with call bell/phone within reach;with family/visitor present Nurse Communication: Mobility status PT Visit Diagnosis: Difficulty in walking, not elsewhere classified (R26.2);Unsteadiness on feet (R26.81)     Time: 1425-1450 PT Time Calculation (min) (ACUTE ONLY): 25 min  Charges:  $Gait Training: 8-22 mins $Therapeutic Activity: 8-22 mins                     Julaine Fusi PTA 04/04/22, 3:33 PM

## 2022-04-04 NOTE — Progress Notes (Signed)
Nutrition Follow-up  DOCUMENTATION CODES:   Not applicable  INTERVENTION:   -Ensure Enlive po TID, each supplement provides 350 kcal and 20 grams of protein.  -MVI with minerals daily starting on 04/05/22 -TPN management per pharmacy; to d/c tonight at [place]:   Increased nutrient needs related to post-op healing as evidenced by estimated needs.  Ongoing  GOAL:   Patient will meet greater than or equal to 90% of their needs  Progressing   MONITOR:   Diet advancement  REASON FOR ASSESSMENT:   Consult New TPN/TNA  ASSESSMENT:   Ptm with medical history significant for diabetes mellitus with complications of stage IV chronic kidney disease, depression, history of prior CVA with residual hearing loss, coronary artery disease, anxiety disorder, status post recent suprapubic catheter placement for chronic urinary retention and urethral dilation for stricture with urology on 11/13.  She presented for evaluation of severe abdominal pain with associated with nausea and emesis.   11/23- s/p ex lap with LOA 11/25- NGT placed 11/26- NT pulled out by pt 11/27- refusing NGT replacement, starting TPN today 11/28- NGT reinserted 12/3- advanced to clear liquid diet, NGT clamped 12/4- NGT d/c 12/5- advanced to full liquids 12/6- advanced to regular diet  Reviewed I/O's: -125 ml x 24 hours and +3.3 L since 03/21/22  Spoke with pt at bedside, who was pleasant and in good spirits today. She reports he is happy to eat solid foods and get NGT removed. Pt reports tolerating diet well and consumed a sausage patty and most of her scrambled eggs today. Pt does complain of a small amount of pain where her incision site is, but recently received pain medications today.   Pt currently receiving TPN at 25 ml/hr, which provides 616 kcals and 32 grams protein, meeting 29% of estimated kcal needs and 28% of estimated protein needs. Plan to d/c TPN today at 1800.   Pt shares that  she has had multiple BMS today and complains of urinary incontinence. Case discussed with RN; plan for bladder scan today.   Discussed importance of good meal and supplement intake to promote healing. Pt does not like the Boost Breeze supplements, as they are too sweet, but amenable to Ensure.   Pt looking forward to discharging home with her family.   Medications reviewed and include colace, reglan, miralax, dextrose 5% solution @ 25 ml/hr, and keppra.   Labs reviewed: Na: 133, K: 5.2, CBGS: 95-120 (inpatient orders for glycemic control are 0-9 units insulin aspart every 8 hours).    Diet Order:   Diet Order             Diet regular Room service appropriate? Yes; Fluid consistency: Thin  Diet effective now                   EDUCATION NEEDS:   Education needs have been addressed  Skin:  Skin Assessment: Skin Integrity Issues: Skin Integrity Issues:: Incisions Incisions: closed abdomen and perineum  Last BM:  04/04/22 (type 7)  Height:   Ht Readings from Last 1 Encounters:  03/14/22 '5\' 11"'$  (1.803 m)    Weight:   Wt Readings from Last 1 Encounters:  04/04/22 84.1 kg    Ideal Body Weight:  70.5 kg  BMI:  Body mass index is 25.86 kg/m.  Estimated Nutritional Needs:   Kcal:  2100-2300  Protein:  115-130 grams  Fluid:  > 2 L    Leslie Houston, RD, LDN, [place] Registered Dietitian II Certified  Diabetes Care and Education Specialist Please refer to [facility] for RD and/or RD on-call/weekend/after hours pager

## 2022-04-04 NOTE — Progress Notes (Signed)
PHARMACY - TOTAL PARENTERAL NUTRITION CONSULT NOTE   Indication: Prolonged ileus  Patient Measurements: Height: '5\' 11"'$  (180.3 cm) Weight: 84.1 kg (185 lb 6.5 oz) IBW/kg (Calculated) : 70.8   Body mass index is 25.86 kg/m. Usual Weight: 86.8 kg  Assessment:  71 year old female admitted with small bowel obstruction s/p lysis of adhesions. Past medical history includes diabetes mellitus (A1c 4.7% on 12/05/21), CKD4, depression, history of prior CVA, CAD, anxiety. On 11/25, patient pulled out NG tube. Starting TPN for prolonged ileus.  12/5:  Provider wants to begin weaning TPN, with plan to hopefully stop TPN tomorrow if patient tolerates diet.  12/6:  Provider ok with weaning TPN further and stopping today.  Glucose / Insulin:  CBGs 96-131 in previous 24 hours.   Electrolytes:  Na+ 139>140>137>135>133 (trending down).  K+ 4.4>4.5>5.3>5.3>5.2 (elevated) Cl  117>118>118>115 CO2 117>18>17>19 Phos stable 2.5 > 3.3>3.6>3.9  Renal: Scr 2.1>2.27>2.35 (trending upward)  Hepatic: 12/4 AST/ALT: 18/20, Tbili 0.7  Intake / Output; MIVF:  MIVF D5 @ 25 mL/hr I&O -666 mL Intake: +2.24 L from IV  Output: -2850 mL (urine), 55 mL (NG output)  GI Imaging: --CT scan of abdomen and pelvis: few dilated loops of proximal small bowel within the left hemi abdomen gradually taper to the level of the right lower quadrant, where these bowel loops appear to tether to the uterus in the right adnexa.  --KUB 12/1: stable small bowel dilatation  --12/3: Mild improvement in diffuse small bowel and colonic dilatation,suspicious for ileus.  GI Surgeries / Procedures:  11/23 Exploratory laparotomy   Central access:  PICC double lumen 03/22/22  TPN start date:  03/26/22 03/29/22 advanced to goal rate  Nutritional Goals: Goal TPN rate is 65 mL/hr (provides 54 g/L of protein and 2160 kcals per day)  RD Assessment: Estimated Needs Total Energy Estimated Needs: 2100-2300 Total Protein Estimated  Needs: 115-130 grams Total Fluid Estimated Needs: > 2 L Kcal:  2100-2300 Protein:  115-130 grams Fluid:  > 2 L  Current Nutrition:  Full Liquid Diet  Plan:  --Will wean TPN rate down to 25 ml/hr and then stop TPN 12/6 @ 1800.   Lorin Picket, PharmD Clinical Pharmacist 04/04/2022

## 2022-04-04 NOTE — Progress Notes (Signed)
Progress Note   Patient: Leslie Houston BOF:[id number] DOB: 05/02/1950 DOA: 03/14/2022     21 DOS: the patient was seen and examined on 04/04/2022   Brief hospital course: Merrick Feutz is a 71 y.o. 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  She presented to the ED via EMS on 03/14/2022 for evaluation of severe abdominal pain with associated with nausea and emesis.   Patient was found to have partial SBO vs ileus and underwent ex lap and lysis of adhesions on 11/23.  Postop, had persistent ileus. She was started on empiric IV antibiotics due to concern for UTI.      Assessment and Plan: * Small bowel obstruction ([place]) Initially tried to manage conservatively, but after 1 week, high-grade small bowel obstruction persisting on CT.  Patient underwent ex lap and lysis of lesions on 11/23.  Postop course complicated by persistent ileus requiring replacement of NG tube.  Finally has improved and NG tube removed and patient tolerating clear liquids.  Plan is to slowly advance diet and potential discharge tomorrow.  UTI (urinary tract infection)-resolved as of 03/27/2022 Versus abnormal UA due to colonization and recent instrumentation.  UTI ruled out  Hypernatremia Resolved with D5W.   Monitor BMP.  Urinary retention Status post recent suprapubic and ureteral catheterization on 11/13 with Dr. Erlene Quan. Plan was to leave ureteral Foley in for 1 week to allow urethral healing with a patent tract. 11/21: Suprapubic catheter leaking around the tube, soaking dressings, gown and linens.  Urology do not think a bowel injury is likely. Per Dr. Erlene Quan, this may be normal and related to  bladder spasms.  Most important thing is urine draining through the tube itself. 12/5: Urology capped SP catheter to see if patient can void following urethral stricture dilation done prior to admission. --Foley removed by Dr. Erlene Quan 11/20 --Suprapubic catheter to remain in place --Outpatient urology follow up as scheduled --Continue Flomax  Hyperkalemia Potassium was slight improvement at 5.2.  Recheck labs this afternoon.  If still elevated, Lokelma  Seizures ([place]) Continue IV Keppra until taking PO. Seizure precautions.  CKD (chronic kidney disease) stage 4, GFR 15-29 ml/min (HCC) Cr improved, but last couple days has trended up slightly.  Seems near her baseline. Monitor BMP.  Partial intestinal obstruction (HCC)-resolved as of 03/22/2022 See partial SBO and surgery's recommendations  Hemiparesis affecting right side as late effect of cerebrovascular accident (CVA) (HCC)-resolved as of 03/22/2022 History of prior CVA with left-sided hemiparesis Resume aspirin once patient is able to tolerate p.o.        Subjective: Pt was awake sitting up in bed seen this morning.  NG tube was removed yesterday and she reports doing well tolerating full liquid diet so far.  Also reports sensation of needing to void.  She is encouraged by her progress last day or 2.  Hopes to go home soon.  No nausea or vomiting.  Is having bowel movements.    Physical Exam: Vitals:   04/03/22 2048 04/04/22 0500 04/04/22 0531 04/04/22 0850  BP: (!) 156/50  (!) 130/44 (!) 111/51  Pulse: 64  64 (!) 59  Resp: '16  18 18  '$ Temp: 98.4 F (36.9 C)  98.5 F (36.9 C) 98 F (36.7 C)  TempSrc: Oral  Oral Oral  SpO2: 96%  95% 94%  Weight:  84.1 kg    Height:       General exam: Awake and alert, no acute distress  Respiratory system: clear to auscultation bilaterally Cardiovascular system: Regular rate and rhythm, S1-S2 Gastrointestinal system: Soft, nontender, nondistended, hypoactive bowel  sounds Extremities: No clubbing or cyanosis or edema    Data Reviewed: Potassium of 5.2, creatinine at 2.53  Family Communication: None   Disposition: Status is: Inpatient Remains inpatient appropriate because:  Advancement of diet -Stable potassium Discharge will be per general surgery, primary service.    Planned Discharge Destination: Home   DVT prophylaxis-SCDs   Time spent: 25 minutes  Author: Annita Brod, MD 04/04/2022 3:32 PM  For on call review [email or web address].

## 2022-04-04 NOTE — Progress Notes (Signed)
Subjective:  CC: Leslie Houston is a 71 y.o. female  Hospital stay day 21, 13 Days Post-Op partial SBO versus ileus  HPI: No issues.  More formed stools again today.  Tolerated some solids for lunch  ROS:  General: Denies weight loss, weight gain, fatigue, fevers, chills, and night sweats. Heart: Denies chest pain, palpitations, racing heart, irregular heartbeat, leg pain or swelling, and decreased activity tolerance. Respiratory: Denies breathing difficulty, shortness of breath, wheezing, cough, and sputum. GI: Denies change in appetite, heartburn, nausea, vomiting, constipation, diarrhea, and blood in stool. GU: Denies difficulty urinating, pain with urinating, urgency, frequency, blood in urine.   Objective:   Temp:  [98 F (36.7 C)-98.5 F (36.9 C)] 98.4 F (36.9 C) (12/06 1623) Pulse Rate:  [59-64] 64 (12/06 1623) Resp:  [16-18] 16 (12/06 1623) BP: (111-156)/(40-51) 112/40 (12/06 1623) SpO2:  [94 %-96 %] 94 % (12/06 1623) Weight:  [84.1 kg] 84.1 kg (12/06 0500)     Height: '5\' 11"'$  (180.3 cm) Weight: 84.1 kg BMI (Calculated): 25.87   Intake/Output this shift:   Intake/Output Summary (Last 24 hours) at 04/04/2022 1632 Last data filed at 04/04/2022 1614 Gross per 24 hour  Intake --  Output 375 ml  Net -375 ml    Constitutional :  alert, cooperative, appears stated age, and no distress  Respiratory:  clear to auscultation bilaterally  Cardiovascular:  regular rate and rhythm  Gastrointestinal: soft, non-tender; bowel sounds normal; no masses,  no organomegaly.  Stable abdominal exam.  Skin: Cool and moist. Staple line c/d/i  Psychiatric: Normal affect, non-agitated, not confused       LABS:     Latest Ref Rng & Units 04/04/2022    5:27 AM 04/03/2022    6:45 AM 04/02/2022    5:30 AM  CMP  Glucose 70 - 99 mg/dL 107  93  96   BUN 8 - 23 mg/dL 62  57  55   Creatinine 0.44 - 1.00 mg/dL 2.35  2.27  2.10   Sodium 135 - 145 mmol/L 133  135  137   Potassium 3.5 - 5.1  mmol/L 5.2  5.3  5.3   Chloride 98 - 111 mmol/L 110  112  115   CO2 22 - 32 mmol/L '16  19  17   '$ Calcium 8.9 - 10.3 mg/dL 8.4  8.5  8.9   Total Protein 6.5 - 8.1 g/dL   6.3   Total Bilirubin 0.3 - 1.2 mg/dL   0.7   Alkaline Phos 38 - 126 U/L   115   AST 15 - 41 U/L   18   ALT 0 - 44 U/L   20       Latest Ref Rng & Units 04/01/2022    3:40 AM 03/27/2022    5:18 AM 03/25/2022    5:00 AM  CBC  WBC 4.0 - 10.5 K/uL 6.8  6.7  8.8   Hemoglobin 12.0 - 15.0 g/dL 9.1  9.7  10.4   Hematocrit 36.0 - 46.0 % 27.6  30.7  32.6   Platelets 150 - 400 K/uL 211  185  226     RADS: N/a  Assessment:   S/p ex lap, lysis of adhesions.  Now with postop ileus, resolving  Continues to have multiple bowel movements and tolerating solids for lunch.  We will continue to observe for 1 more night to ensure solids are tolerated prior to discharge.  Staples removed at bedside today with no issues.  labs/images/medications/previous chart entries  reviewed personally and relevant changes/updates noted above.

## 2022-04-04 NOTE — Progress Notes (Signed)
SCD applied . She was off for SCD whole day.

## 2022-04-04 NOTE — Progress Notes (Signed)
Patient's SPT continues to be plugged.  At 10 AM bladder scan noted 292 mL and then patient stood to void and had incontinence.  They rescanned her at that time for scan of 207 mL.  At 3:00 this afternoon her bladder scan noted a PVR of 84 mL.  They get a repeat bladder scan at 2100.

## 2022-04-05 DIAGNOSIS — T83510D Infection and inflammatory reaction due to cystostomy catheter, subsequent encounter: Secondary | ICD-10-CM | POA: Diagnosis not present

## 2022-04-05 DIAGNOSIS — K56609 Unspecified intestinal obstruction, unspecified as to partial versus complete obstruction: Secondary | ICD-10-CM | POA: Diagnosis not present

## 2022-04-05 DIAGNOSIS — N184 Chronic kidney disease, stage 4 (severe): Secondary | ICD-10-CM | POA: Diagnosis not present

## 2022-04-05 DIAGNOSIS — E870 Hyperosmolality and hypernatremia: Secondary | ICD-10-CM | POA: Diagnosis not present

## 2022-04-05 DIAGNOSIS — K5669 Other partial intestinal obstruction: Secondary | ICD-10-CM | POA: Diagnosis not present

## 2022-04-05 DIAGNOSIS — R1084 Generalized abdominal pain: Secondary | ICD-10-CM | POA: Diagnosis not present

## 2022-04-05 LAB — COMPREHENSIVE METABOLIC PANEL
ALT: 17 U/L (ref 0–44)
AST: 15 U/L (ref 15–41)
Albumin: 2.5 g/dL — ABNORMAL LOW (ref 3.5–5.0)
Alkaline Phosphatase: 98 U/L (ref 38–126)
Anion gap: 4 — ABNORMAL LOW (ref 5–15)
BUN: 55 mg/dL — ABNORMAL HIGH (ref 8–23)
CO2: 19 mmol/L — ABNORMAL LOW (ref 22–32)
Calcium: 8.1 mg/dL — ABNORMAL LOW (ref 8.9–10.3)
Chloride: 112 mmol/L — ABNORMAL HIGH (ref 98–111)
Creatinine, Ser: 2.38 mg/dL — ABNORMAL HIGH (ref 0.44–1.00)
GFR, Estimated: 21 mL/min — ABNORMAL LOW (ref >60)
Glucose, Bld: 90 mg/dL (ref 70–99)
Potassium: 4.6 mmol/L (ref 3.5–5.1)
Sodium: 135 mmol/L (ref 135–145)
Total Bilirubin: 0.7 mg/dL (ref 0.3–1.2)
Total Protein: 6.1 g/dL — ABNORMAL LOW (ref 6.5–8.1)

## 2022-04-05 LAB — PHOSPHORUS: Phosphorus: 4 mg/dL (ref 2.5–4.6)

## 2022-04-05 LAB — TRIGLYCERIDES: Triglycerides: 53 mg/dL (ref <150)

## 2022-04-05 LAB — MAGNESIUM: Magnesium: 2.4 mg/dL (ref 1.7–2.4)

## 2022-04-05 LAB — GLUCOSE, CAPILLARY
Glucose-Capillary: 71 mg/dL (ref 70–99)
Glucose-Capillary: 87 mg/dL (ref 70–99)

## 2022-04-05 MED ORDER — ENSURE ENLIVE PO LIQD: 237.0000 mL | Freq: Two times a day (BID) | ORAL | 12 refills | Status: DC

## 2022-04-05 MED ORDER — POLYETHYLENE GLYCOL 3350 17 G PO PACK: 17.0000 g | PACK | Freq: Every day | ORAL | 0 refills | Status: DC

## 2022-04-05 MED ORDER — LEVETIRACETAM 1000 MG PO TABS: 1000.0000 mg | ORAL_TABLET | Freq: Two times a day (BID) | ORAL | 3 refills | Status: DC

## 2022-04-05 MED ORDER — TAMSULOSIN HCL 0.4 MG PO CAPS: 0.4000 mg | ORAL_CAPSULE | Freq: Every day | ORAL | 0 refills | Status: DC

## 2022-04-05 MED ORDER — ADULT MULTIVITAMIN W/MINERALS CH: 1.0000 | ORAL_TABLET | Freq: Every day | ORAL | 1 refills | Status: DC

## 2022-04-05 NOTE — Progress Notes (Signed)
Pt discharged home with daughter. PICC line removed. Discharge packet reviewed with daughter, [name], verbalizes understanding.

## 2022-04-05 NOTE — Discharge Summary (Signed)
Physician Discharge Summary   Patient: Leslie Houston MRN: [id number] DOB: 1951-03-05  Admit date:     03/14/2022  Discharge date: 04/05/22  Discharge Physician: Annita Brod   PCP: Teodora Medici, DO   Recommendations at discharge:   Medication change: Aspirin continues to remain on hold until told to be restarted by her PCP Medication change: Hydralazine 10 mg p.o. daily discontinued, due to hypotension Medication clarification: Keppra 1000 mg p.o. twice daily.  Patient had reported that she is on 2000 mg p.o. however cannot find any documentation of this and during her hospitalization, she was only on 1000 twice daily New medication: MiraLAX 17 g p.o. daily Medication clarification: Flomax 0.4 mg p.o. daily Patient will follow-up with general surgery in 1 month Patient will follow-up with urology in 1 week Home with home health PT OT  Discharge Diagnoses: Principal Problem:   Small bowel obstruction ([place]) Active Problems:   CKD (chronic kidney disease) stage 4, GFR 15-29 ml/min (HCC)   Seizures (HCC)   Hyperkalemia   Urinary retention   Hypernatremia  Resolved Problems:   UTI (urinary tract infection)   Hemiparesis affecting right side as late effect of cerebrovascular accident (CVA) ([place])   Partial intestinal obstruction [facility])  Hospital Course: Chontel Warning is a 71 y.o. 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  She presented to the ED via EMS on 03/14/2022 for evaluation of severe abdominal pain with associated with nausea and emesis.   Patient was found to have partial SBO vs ileus and underwent ex lap and lysis of adhesions on 11/23.  Postop,  had persistent ileus. She was started on empiric IV antibiotics due to concern for UTI.    Assessment and Plan: * Small bowel obstruction ([place]) Initially tried to manage conservatively, but after 1 week, high-grade small bowel obstruction persisting on CT.  Patient underwent ex lap and lysis of lesions on 11/23.  Postop course complicated by persistent ileus requiring replacement of NG tube.  Finally has improved and NG tube removed and patient tolerating clear liquids.  Plan is to slowly advance diet and potential discharge tomorrow.   UTI (urinary tract infection)-resolved as of 03/27/2022 Versus abnormal UA due to colonization and recent instrumentation.  UTI ruled out   Hypernatremia Resolved with D5W.   Monitor BMP.   Urinary retention Status post recent suprapubic and ureteral catheterization on 11/13 with Dr. Erlene Quan. Plan was to leave ureteral Foley in for 1 week to allow urethral healing with a patent tract. 11/21: Suprapubic catheter leaking around the tube, soaking dressings, gown and linens.  Urology do not think a bowel injury is likely. Per Dr. Erlene Quan, this may be normal and related to bladder spasms.  Most important thing is urine draining through the tube itself. 12/5: Urology capped SP catheter to see if patient can void following urethral stricture dilation done prior to admission. --Foley removed by Dr. Erlene Quan 11/20 --Suprapubic catheter to remain in place --Outpatient urology follow up in 1 week --Continue Flomax   Hyperkalemia By day of discharge, potassium down to 4.6   Seizures ([place]) Remained on IV During hospitalization, changed over to p.o. once she was tolerating diet Seizure precautions.   CKD (chronic kidney disease) stage 4, GFR 15-29 ml/min (HCC) By  day of discharge, creatinine at 2.38 with GFR of 21   Partial intestinal obstruction (HCC)-resolved as of 03/22/2022 See partial SBO and surgery's recommendations.  Outpatient follow-up with surgery in 1  month   Hemiparesis affecting right side as late effect of cerebrovascular accident (CVA) (HCC)-resolved as of 03/22/2022 History of prior CVA with left-sided hemiparesis       Consultants: General surgery, urology Procedures performed: Ex lap status post LOA 11/23 Disposition: Home with home health Diet recommendation:  Discharge Diet Orders (From admission, onward)     Start     Ordered   04/05/22 0000  Diet - low sodium heart healthy        04/05/22 1433           Cardiac diet with Ensure shakes p.o. twice daily between meals DISCHARGE MEDICATION: Allergies as of 04/05/2022       Reactions   Latex Rash   Penicillin G Itching   Atorvastatin Other (See Comments)   Myalgias    Other Hives   Adhesive tape   Amlodipine Swelling   Leg swelling   Doxycycline    Blood in urine        Medication List     STOP taking these medications    Aspirin Low Dose 81 MG tablet Generic drug: aspirin EC   ezetimibe 10 MG tablet Commonly known as: ZETIA   hydrALAZINE 10 MG tablet Commonly known as: APRESOLINE       TAKE these medications    citalopram 20 MG tablet Commonly known as: CeleXA Take 1 tablet (20 mg total) by mouth daily.   feeding supplement Liqd Take 237 mLs by mouth 2 (two) times daily between meals. Start taking on: April 06, 2022   ferrous sulfate 325 (65 FE) MG tablet Take 325 mg by mouth daily with breakfast.   fluticasone 50 MCG/ACT nasal spray Commonly known as: FLONASE Place 2 sprays into both nostrils daily as needed for allergies (allergies).   levETIRAcetam 1000 MG tablet Commonly known as: KEPPRA Take 1 tablet (1,000 mg total) by mouth 2 (two) times daily.   loratadine 10 MG tablet Commonly known as: CLARITIN TAKE 1 TABLET BY MOUTH ONCE A DAY AS NEEDED FOR ALLERGIES   multivitamin with minerals Tabs tablet Take 1 tablet by mouth daily. Start taking on: April 06, 2022   pantoprazole 20 MG tablet Commonly known as:  PROTONIX TAKE 1 TABLET BY MOUTH ONCE A DAY   polyethylene glycol 17 g packet Commonly known as: MIRALAX / GLYCOLAX Take 17 g by mouth daily. Start taking on: April 06, 2022   tamsulosin 0.4 MG Caps capsule Commonly known as: FLOMAX Take 1 capsule (0.4 mg total) by mouth daily. Take one in the morning and one at night. What changed: how much to take   torsemide 20 MG tablet Commonly known as: DEMADEX TAKE 1 TABLET BY MOUTH ONCE DAILY   Vitamin D3 25 MCG (1000 UT) Caps Take 1 capsule by mouth daily.               Durable Medical Equipment  (From admission, onward)           Start     Ordered   04/05/22 1214  For home use only DME Tub bench  Once        04/05/22 1213   03/25/22 1028  For home use only DME Walker rolling  Once       Question Answer Comment  Walker: With 5 Inch  Wheels   Patient needs a walker to treat with the following condition General weakness      03/25/22 1028            Follow-up Information     Vaillancourt, Hurricane, PA-C. Go in 1 week(s).   Specialty: Urology Why: Patient need to call and schedule follow-up appt. Office was closed. Contact information: [place] [address] [phone]         Benjamine Sprague, DO. Go on 05/02/2022.   Specialty: Surgery Why: '@2'$ :30pm. Please bring insurance information and a list of medication. Contact information: [address] [place] [address] [phone]                Discharge Exam: Filed Weights   04/02/22 0500 04/04/22 0500 04/05/22 0500  Weight: 82.9 kg 84.1 kg 85.9 kg   General: Alert and oriented x 3, no acute distress Cardiovascular: Regular rate and rhythm, S1-S2  Condition at discharge: good  The results of significant diagnostics from this hospitalization (including imaging, microbiology, ancillary and laboratory) are listed below for reference.   Imaging Studies: DG Abd 1 View  Result Date: 04/03/2022 CLINICAL DATA:  Abdominal  distension. EXAM: ABDOMEN - 1 VIEW COMPARISON:  April 01, 2022. FINDINGS: Stable mildly dilated small bowel loops are noted. No colonic dilatation is noted. No radio-opaque calculi or other significant radiographic abnormality are seen. IMPRESSION: Stable small bowel dilatation suggesting ileus. Electronically Signed   By: Marijo Conception M.D.   On: 04/03/2022 10:35   DG ABD ACUTE 2+V W 1V CHEST  Result Date: 04/01/2022 CLINICAL DATA:  Small-bowel obstruction. EXAM: DG ABDOMEN ACUTE WITH 1 VIEW CHEST COMPARISON:  03/31/2022 FINDINGS: Skin staple seen in the lower anterior abdominal wall soft tissues. Orogastric tube tip remains in the proximal stomach. Mild diffuse dilatation of small bowel and colon show mild improvement since previous study. Mild cardiomegaly is noted.  Both lungs are clear. IMPRESSION: Mild improvement in diffuse small bowel and colonic dilatation, suspicious for ileus. Electronically Signed   By: Marlaine Hind M.D.   On: 04/01/2022 09:21   DG Abd 1 View  Result Date: 03/31/2022 CLINICAL DATA:  Follow-up dilated bowel loops EXAM: ABDOMEN - 1 VIEW COMPARISON:  03/30/2022 and prior studies FINDINGS: Enteric tube again noted with tip overlying the proximal stomach and side hole overlying the distal esophagus-recommend advancement. Mildly distended small bowel loops are again noted and not significantly changed. Oral contrast within the colon and rectum is again noted. Surgical staples again identified. IMPRESSION: 1. Enteric tube with tip overlying the proximal stomach and side hole overlying the distal esophagus-recommend advancement. 2. Unchanged mildly distended small bowel loops. Electronically Signed   By: Margarette Canada M.D.   On: 03/31/2022 08:34   DG Abd 1 View  Result Date: 03/30/2022 CLINICAL DATA:  Abdominal pain and distention. EXAM: ABDOMEN - 1 VIEW COMPARISON:  March 28, 2022. FINDINGS: Nasogastric tube tip is seen in proximal stomach. Residual contrast is noted in  nondilated colon. Mildly dilated small bowel loops are noted concerning for ileus. Midline surgical staples are noted. IMPRESSION: Stable small bowel dilatation is noted most consistent with ileus. Electronically Signed   By: Marijo Conception M.D.   On: 03/30/2022 08:18   CT ABDOMEN PELVIS WO CONTRAST  Result Date: 03/28/2022 CLINICAL DATA:  Abdominal pain. EXAM: CT ABDOMEN AND PELVIS WITHOUT CONTRAST TECHNIQUE: Multidetector CT imaging of the abdomen and pelvis was performed following the standard protocol without IV contrast. RADIATION DOSE  REDUCTION: This exam was performed according to the departmental dose-optimization program which includes automated exposure control, adjustment of the mA and/or kV according to patient size and/or use of iterative reconstruction technique. COMPARISON:  CT scan 03/21/2022 FINDINGS: Lower chest: Bibasilar scarring changes and subsegmental atelectasis at the right lung base. The heart is normal in size. No pericardial effusion. NG tube is coursing down the esophagus and into the stomach. Hepatobiliary: No hepatic lesions or intrahepatic biliary dilatation. The gallbladder is unremarkable. No common bile duct dilatation. Pancreas: No mass, inflammation or ductal dilatation. Spleen: Normal size. No focal lesions. Adrenals/Urinary Tract: The adrenal glands are unremarkable and stable. Chronic atrophic kidneys with bilateral hydroureteronephrosis. The bladder is decompressed by a Foley catheter. Stomach/Bowel: NG tube is located in the stomach. Stomach is moderately dilated. The duodenum is unremarkable. There are persistent dilated small bowel loops with air-fluid levels. Transition to distal decompressed/normal small bowel loops. In the region of the distal ileum in the central pelvis. The colon is relatively decompressed but does contain a fair amount of contrast from prior study. This would suggest a partial small bowel obstruction. Vascular/Lymphatic: Stable atherosclerotic  calcifications involving the aorta and iliac arteries but no aneurysm. No mesenteric or retroperitoneal mass or adenopathy. Reproductive: The uterus and ovaries are unremarkable. Other: Minimal free pelvic fluid. Musculoskeletal: No significant bony findings. IMPRESSION: 1. Persistent dilated small bowel loops with air-fluid levels suggesting a partial small bowel obstruction. Transition point in the distal ileum in the mid pelvis, likely due to adhesions. Oral contrast from the prior CT scan is noted throughout the colon suggesting a partial small bowel obstruction. 2. Chronic atrophic kidneys with bilateral hydroureteronephrosis. The bladder is decompressed by a Foley catheter. 3. Aortic atherosclerosis. Aortic Atherosclerosis (ICD10-I70.0). Electronically Signed   By: Marijo Sanes M.D.   On: 03/28/2022 16:31   DG Abd 1 View  Result Date: 03/28/2022 CLINICAL DATA:  Ileus EXAM: ABDOMEN - 1 VIEW COMPARISON:  Abdominal radiograph dated 03/27/2022 FINDINGS: Enteric tube tip projects over the stomach. Inferior approach catheter projects over the lower midline pelvis. Persistent gas-filled dilation of multiple small bowel loops. Gaseous distention of the cecum is no longer seen. No definite free air or pneumatosis. Residual contrast material s within a few loops of bowel. Postsurgical changes of exploratory laparotomy. Surgical staples project over midline lower abdomen. No acute or substantial osseous abnormality. The sacrum and coccyx are partially obscured by overlying bowel contents. Bibasilar linear atelectasis. IMPRESSION: Persistent gas-filled dilation of multiple small bowel loops, in keeping with ileus. Gaseous distention of the cecum is no longer seen. Electronically Signed   By: Darrin Nipper M.D.   On: 03/28/2022 10:50   DG Abd 1 View  Result Date: 03/27/2022 CLINICAL DATA:  Ileus EXAM: ABDOMEN - 1 VIEW COMPARISON:  03/26/2022 FINDINGS: Orogastric or nasogastric tube enters the stomach. Some  previously administered contrast within the colon. Midline surgical staples are evident. Persistent mild ileus pattern, similar to yesterday's exam. No worsening or new finding. IMPRESSION: Persistent mild ileus pattern, similar to yesterday's exam. Electronically Signed   By: Nelson Chimes M.D.   On: 03/27/2022 09:44   DG Abd 1 View  Result Date: 03/26/2022 CLINICAL DATA:  NG tube placement EXAM: ABDOMEN - 1 VIEW COMPARISON:  03/25/2022 FINDINGS: Limited radiograph of the lower chest and upper abdomen was obtained for the purposes of enteric tube localization. Enteric tube is seen coursing below the diaphragm with distal tip in the proximal stomach. Side port at the level of the  distal esophagus. IMPRESSION: Enteric tube with distal tip in the proximal stomach. Side port at the level of the distal esophagus. Recommend advancement 7-10 cm. Electronically Signed   By: Davina Poke D.O.   On: 03/26/2022 16:51   DG Abd 1 View  Result Date: 03/25/2022 CLINICAL DATA:  Follow-up exploratory laparotomy and lysis of adhesions for small-bowel obstruction. EXAM: ABDOMEN - 1 VIEW COMPARISON:  03/23/2022 FINDINGS: Enteric tube is no longer visualized. Surgical staples are noted overlying the abdomen/pelvis. Dilated loops of small bowel are noted. Gas in stomach noted.  Majority of the colon is not distended. IMPRESSION: Distended small bowel loops noted with nondistended colon. This may represent ileus or continued bowel obstruction. Clinical follow-up recommended. Electronically Signed   By: Margarette Canada M.D.   On: 03/25/2022 08:54   DG Abd 1 View  Result Date: 03/23/2022 CLINICAL DATA:  Enteric tube placement EXAM: ABDOMEN - 1 VIEW COMPARISON:  Abdominal radiograph dated 03/18/2022 and CT abdomen pelvis dated 03/21/2022 FINDINGS: An enteric tube terminates in the stomach. Gas-filled loops of bowel are partially imaged. IMPRESSION: Enteric tube terminates in the stomach. Electronically Signed   By: Zerita Boers M.D.   On: 03/23/2022 15:53   [place] EKG SITE RITE  Result Date: 03/22/2022 If Site Rite image not attached, placement could not be confirmed due to current cardiac rhythm.  CT ABDOMEN PELVIS WO CONTRAST  Result Date: 03/21/2022 CLINICAL DATA:  Bowel obstruction suspected EXAM: CT ABDOMEN AND PELVIS WITHOUT CONTRAST TECHNIQUE: Multidetector CT imaging of the abdomen and pelvis was performed following the standard protocol without IV contrast. RADIATION DOSE REDUCTION: This exam was performed according to the departmental dose-optimization program which includes automated exposure control, adjustment of the mA and/or kV according to patient size and/or use of iterative reconstruction technique. COMPARISON:  None Available. FINDINGS: Lower chest: Atelectasis at the RIGHT lung base Hepatobiliary: No focal hepatic lesion. Normal gallbladder. No biliary duct dilatation. Common bile duct is normal. Pancreas: Pancreas is normal. No ductal dilatation. No pancreatic inflammation. Spleen: Normal spleen Adrenals/urinary tract: Adrenal glands normal. Kidneys are atrophic. Ureters normal. Insert a pubic catheter in the bladder. Bladder collapsed. Stomach/Bowel: Stomach is distended with oral contrast. The duodenum is fluid filled and distended. The fourth portion the duodenum measures 5.3 cm. There is a fluid level the proximal small bowel. Proximal bowel measures 4.8 cm image 48/2). Transition point in the mid abdominal mesentery from dilated small bowel to collapsed small bowel occurs on image 45/2. distal small bowel is collapsed leading up the terminal ileum. Appendix is normal. Colon rectosigmoid colon normal. No pneumatosis or portal venous gas.  No perforation or free fluid. Vascular/Lymphatic: Abdominal aorta is normal caliber. No periportal or retroperitoneal adenopathy. No pelvic adenopathy. Reproductive: Uterus and adnexa unremarkable. Other: No free fluid. Musculoskeletal: No aggressive osseous lesion.  IMPRESSION: 1. High-grade mechanical small bowel obstruction. Recommend surgical consultation. 2. Potential transition point in the mid abdominal central mesentery. 3. No portal venous gas or pneumatosis. No perforation or free fluid. These results will be called to the ordering clinician or representative by the Radiologist Assistant, and communication documented in the PACS or [employer]. Electronically Signed   By: Suzy Bouchard M.D.   On: 03/21/2022 14:43   DG Abd 1 View  Result Date: 03/18/2022 CLINICAL DATA:  Abdominal distension and pain EXAM: ABDOMEN - 1 VIEW COMPARISON:  Radiograph 03/17/2022 FINDINGS: Persistent dilated loops of small bowel measuring up to 5.3 cm. No air-fluid levels. Potential small amount gas in  the rectum. Removal of NG tube. IMPRESSION: No change in small bowel obstruction pattern.  NG tube removed. Electronically Signed   By: Suzy Bouchard M.D.   On: 03/18/2022 15:22   DG Abd 2 Views  Result Date: 03/17/2022 CLINICAL DATA:  Small-bowel obstruction EXAM: ABDOMEN - 2 VIEW COMPARISON:  Abdominal radiograph dated 03/16/2022 FINDINGS: Enteric tube tip projects over the stomach with side hole approximately 1 cm distal to the GE junction. Slightly decreased but persistent diffuse dilation of small bowel loops. No free air or pneumatosis. No acute or substantial osseous abnormality. The sacrum and coccyx are partially obscured by overlying bowel contents. Right basilar and left mid lung linear opacities, likely atelectasis. IMPRESSION: 1. Slightly decreased but persistent diffuse dilation of small bowel loops. 2. Enteric tube tip projects over the stomach with side-hole proximally 1 cm distal to the GE junction. Consider advancing by 4 cm for more optimal positioning. Electronically Signed   By: Darrin Nipper M.D.   On: 03/17/2022 08:50   DG Abd 2 Views  Result Date: 03/16/2022 CLINICAL DATA:  Follow-up small bowel obstruction EXAM: ABDOMEN - 2 VIEW COMPARISON:   03/15/2022 abdominal radiograph FINDINGS: Enteric tube terminates in the proximal stomach. Persistent prominently dilated small bowel loops throughout the abdomen measuring up to 6.7 cm diameter, not substantially changed. Mild colonic stool. No evidence of pneumatosis or pneumoperitoneum. Minimal streaky bibasilar atelectasis. Marked lumbar spondylosis. IMPRESSION: Persistent prominent diffuse small bowel dilatation, not substantially changed, compatible with distal small bowel obstruction. Enteric tube terminates in the proximal stomach. Electronically Signed   By: Ilona Sorrel M.D.   On: 03/16/2022 08:21   DG Abd Portable 1V-Small Bowel Obstruction Protocol-initial, 8 hr delay  Result Date: 03/15/2022 CLINICAL DATA:  8 hour delay after oral contrast administration, small-bowel obstruction EXAM: PORTABLE ABDOMEN - 1 VIEW COMPARISON:  Same day radiograph FINDINGS: Slight retraction of the nasogastric tube, side port near the GE junction. Persistent small bowel dilation, measuring up to 6.1 cm. Reportedly, oral contrast has been administered, but none is visualized. IMPRESSION: Persistent small bowel obstruction. Nasogastric tube has slightly retracted, side port near the GE junction, recommend advancement by the foot 2.5 cm. Reportedly oral contrast has been administered, however no significant volume of oral contrast is visualized. Electronically Signed   By: Maurine Simmering M.D.   On: 03/15/2022 18:29   DG Abd 1 View  Result Date: 03/15/2022 CLINICAL DATA:  Encounter for imaging study to confirm nasogastric (NG) tube placement EXAM: ABDOMEN - 1 VIEW COMPARISON:  Radiograph 03/15/2022 FINDINGS: Nasogastric tube tip and side port overlie the stomach. Persistent small bowel dilation. Bandlike atelectasis in the left lower lung. IMPRESSION: Nasogastric tube tip and side port overlie the stomach. Persistent small bowel obstruction. Electronically Signed   By: Maurine Simmering M.D.   On: 03/15/2022 15:42   DG Abd 2  Views  Result Date: 03/15/2022 CLINICAL DATA:  Small-bowel obstruction EXAM: ABDOMEN - 2 VIEW COMPARISON:  CT AP 03/14/22. FINDINGS: There are persistently dilated loops of small bowel throughout the abdomen measuring up to 4.7 cm, likely at least slightly increased in size compared to the scout images acquired at time of prior CT abdomen and pelvis. There are likely also new dilated loops of small bowel in the left lower quadrant. There is no evidence of pneumatosis. No pneumoperitoneum. Subdiaphragmatic lucency on the left is favored to represent air within a distended stomach. History are unremarkable. No acute osseous abnormality. IMPRESSION: Persistently dilated loops of small bowel throughout the  abdomen, likely at least slightly increased in size compared to the scout images acquired at time of prior CT abdomen and pelvis. Electronically Signed   By: Marin Roberts M.D.   On: 03/15/2022 08:19   US Pelvis Complete  Result Date: 03/14/2022 CLINICAL DATA:  Lower abdominal pain, abnormal CT abdomen and pelvis EXAM: TRANSABDOMINAL ULTRASOUND OF PELVIS TECHNIQUE: Transabdominal ultrasound examination of the pelvis was performed including evaluation of the uterus, ovaries, adnexal regions, and pelvic cul-de-sac. COMPARISON:  CT done earlier today FINDINGS: Uterus Not sonographically visualized. Right ovary Not sonographically visualized. Left ovary Not sonographically visualized. Other findings: Foley catheter and suprapubic cystostomy catheter is noted. Urinary bladder is not distended limiting evaluation in this transabdominal sonogram. According to the note by the technologist, patient refused transvaginal examination. IMPRESSION: There is incomplete distention of the urinary bladder limiting evaluation of pelvis. Uterus and ovaries could not be sonographically visualized for evaluation. Electronically Signed   By: Elmer Picker M.D.   On: 03/14/2022 14:33   CT ABDOMEN PELVIS WO CONTRAST  Result  Date: 03/14/2022 CLINICAL DATA:  Abdominal pain, nausea, and vomiting. Status post recent suprapubic catheter placement 03/12/2022. Patient denies hysterectomy. EXAM: CT ABDOMEN AND PELVIS WITHOUT CONTRAST TECHNIQUE: Multidetector CT imaging of the abdomen and pelvis was performed following the standard protocol without IV contrast. RADIATION DOSE REDUCTION: This exam was performed according to the departmental dose-optimization program which includes automated exposure control, adjustment of the mA and/or kV according to patient size and/or use of iterative reconstruction technique. COMPARISON:  CT abdomen and pelvis dated 08/18/2014 FINDINGS: Lower chest: Bilateral lower lobe subsegmental atelectasis, right-greater-than-left. No pleural effusion or pneumothorax demonstrated. Coronary artery calcifications. No pericardial effusion. Hepatobiliary: No focal hepatic lesions. No intra or extrahepatic biliary ductal dilation. Normal gallbladder. Pancreas: No focal lesions or main ductal dilation. Spleen: Normal in size without focal abnormality. Adrenals/Urinary Tract: No adrenal nodules. Atrophic kidneys with mild residual hydronephrosis and tortuous ureters demonstrating thickened urothelium. No suspicious focal renal masses by noncontrast technique. No calculi. The urinary bladder is decompressed with a Foley and suprapubic catheters in-situ. The suprapubic catheter insertion site in the anterior abdominal wall closely abuts a loop of decompressed small bowel. Mild pericystic stranding. Stomach/Bowel: Normal appearance of the stomach. A few dilated loops of proximal small bowel within the left hemiabdomen to the level of the midline umbilical level, where there is gradual caliber transition. These bowel loops appear to tether to the uterus in the right adnexa. Normal appendix. Vascular/Lymphatic: Aortic atherosclerosis. no enlarged abdominal or pelvic lymph nodes. Reproductive: The uterus is deviated towards the  right adnexa. Apparent tethering of multiple small bowel loops as described. No left adnexal mass. Other: No free fluid, fluid collection, or free air. Musculoskeletal: No acute or abnormal lytic or blastic osseous lesions. Multilevel degenerative changes of the partially imaged thoracic and lumbar spine. IMPRESSION: 1. A few dilated loops of proximal small bowel within the left hemiabdomen gradually taper to the level of the right lower quadrant, where these bowel loops appear to tether to the uterus in the right adnexa. Findings are suggestive of ileus versus partial small bowel obstruction. Consider pelvic ultrasound examination for further evaluation of the uterus and right adnexa and etiology of tethered appearance of the bowel loops. Per clinical record, the patient may be unable to tolerate a transvaginal examination. Given patient body habitus and urinary catheters, recommend instilling saline into the bladder to improve bladder distention and transabdominal sonographic window. 2. Mild pericystic stranding. Correlate with urinalysis for  cystitis. 3. 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 4.  Aortic Atherosclerosis (ICD10-I70.0). These results were called by telephone at the time of interpretation on 03/14/2022 at 10:25 am to provider [facility] , who verbally acknowledged these results. Electronically Signed   By: Darrin Nipper M.D.   On: 03/14/2022 10:37   DG Chest Port 1 View  Result Date: 03/14/2022 CLINICAL DATA:  Questionable sepsis - evaluate for abnormality EXAM: PORTABLE CHEST 1 VIEW COMPARISON:  Radiograph 01/31/2022 FINDINGS: Unchanged cardiomediastinal silhouette. There is bandlike atelectasis in the left lower lung. There is no airspace consolidation. There is no pleural effusion or pneumothorax. There is no acute osseous abnormality. IMPRESSION: No focal airspace  consolidation. Bandlike atelectasis in the left lower lung. Electronically Signed   By: Maurine Simmering M.D.   On: 03/14/2022 08:21   DG OR UROLOGY CYSTO IMAGE ([place])  Result Date: 03/12/2022 There is no interpretation for this exam.  This order is for images obtained during a surgical procedure.  Please See "Surgeries" Tab for more information regarding the procedure.    Microbiology: Results for orders placed or performed during the hospital encounter of 03/14/22  Resp Panel by RT-PCR (Flu A&B, Covid) Anterior Nasal Swab     Status: None   Collection Time: 03/14/22  8:53 AM   Specimen: Anterior Nasal Swab  Result Value Ref Range Status   SARS Coronavirus 2 by RT PCR NEGATIVE NEGATIVE Final    Comment: (NOTE) SARS-CoV-2 target nucleic acids are NOT DETECTED.  The SARS-CoV-2 RNA is generally detectable in upper respiratory specimens during the acute phase of infection. The lowest concentration of SARS-CoV-2 viral copies this assay can detect is 138 copies/mL. A negative result does not preclude SARS-Cov-2 infection and should not be used as the sole basis for treatment or other patient management decisions. A negative result may occur with  improper specimen collection/handling, submission of specimen other than nasopharyngeal swab, presence of viral mutation(s) within the areas targeted by this assay, and inadequate number of viral copies(<138 copies/mL). A negative result must be combined with clinical observations, patient history, and epidemiological information. The expected result is Negative.  Fact Sheet for Patients:  EntrepreneurPulse.com.au  Fact Sheet for Healthcare Providers:  IncredibleEmployment.be  This test is no t yet approved or cleared by the [place] FDA and  has been authorized for detection and/or diagnosis of SARS-CoV-2 by FDA under an Emergency Use Authorization (EUA). This EUA will remain  in effect (meaning this  test can be used) for the duration of the COVID-19 declaration under Section 564(b)(1) of the Act, 21 U.S.C.section 360bbb-3(b)(1), unless the authorization is terminated  or revoked sooner.       Influenza A by PCR NEGATIVE NEGATIVE Final   Influenza B by PCR NEGATIVE NEGATIVE Final    Comment: (NOTE) The Xpert Xpress SARS-CoV-2/FLU/RSV plus assay is intended as an aid in the diagnosis of influenza from Nasopharyngeal swab specimens and should not be used as a sole basis for treatment. Nasal washings and aspirates are unacceptable for Xpert Xpress SARS-CoV-2/FLU/RSV testing.  Fact Sheet for Patients: EntrepreneurPulse.com.au  Fact Sheet for Healthcare Providers: IncredibleEmployment.be  This test is not yet approved or cleared by the [place] FDA and has been authorized for detection and/or diagnosis of SARS-CoV-2 by FDA under an Emergency Use Authorization (EUA). This EUA will remain in effect (meaning this test can be used) for the duration of the COVID-19 declaration under  Section 564(b)(1) of the Act, 21 U.S.C. section 360bbb-3(b)(1), unless the authorization is terminated or revoked.  Performed at [facility], [address], [place], [place] [address]   Urine Culture     Status: None   Collection Time: 03/14/22  8:53 AM   Specimen: Urine, Suprapubic  Result Value Ref Range Status   Specimen Description   Final    URINE, SUPRAPUBIC Performed at [facility], [address], [place], [place] [address]    Special Requests   Final    NONE Performed at [facility], [address], [place], [place] [address]    Culture   Final    NO GROWTH Performed at [facility], [place] [address], [place], [place] [address]    Report Status 03/15/2022 FINAL  Final  Blood Culture (routine x 2)     Status: None   Collection Time: 03/14/22  8:54 AM   Specimen: BLOOD  Result Value Ref Range Status    Specimen Description BLOOD BLOOD RIGHT FOREARM  Final   Special Requests   Final    BOTTLES DRAWN AEROBIC AND ANAEROBIC Blood Culture adequate volume   Culture   Final    NO GROWTH 5 DAYS Performed at [facility], [address] [place], [place] [address]    Report Status 03/19/2022 FINAL  Final  Blood Culture (routine x 2)     Status: None   Collection Time: 03/14/22  8:55 AM   Specimen: BLOOD  Result Value Ref Range Status   Specimen Description BLOOD RIGHT ANTECUBITAL  Final   Special Requests   Final    BOTTLES DRAWN AEROBIC AND ANAEROBIC Blood Culture adequate volume   Culture   Final    NO GROWTH 5 DAYS Performed at [facility], [address] [place], [place] [address]    Report Status 03/19/2022 FINAL  Final    Labs: CBC: Recent Labs  Lab 04/01/22 0340  WBC 6.8  HGB 9.1*  HCT 27.6*  MCV 100.0  PLT 322   Basic Metabolic Panel: Recent Labs  Lab 03/31/22 0605 04/01/22 0340 04/02/22 0530 04/03/22 0645 04/04/22 0527 04/05/22 0642  NA 139 140 137 135 133* 135  K 4.4 4.5 5.3* 5.3* 5.2* 4.6  CL 118* 118* 115* 112* 110 112*  CO2 17* 18* 17* 19* 16* 19*  GLUCOSE 109* 108* 96 93 107* 90  BUN 49* 52* 55* 57* 62* 55*  CREATININE 1.96* 2.13* 2.10* 2.27* 2.35* 2.38*  CALCIUM 8.8* 8.9 8.9 8.5* 8.4* 8.1*  MG  --   --  2.3  --  2.4 2.4  PHOS 3.6  --  3.9  --  4.2 4.0   Liver Function Tests: Recent Labs  Lab 04/02/22 0530 04/05/22 0642  AST 18 15  ALT 20 17  ALKPHOS 115 98  BILITOT 0.7 0.7  PROT 6.3* 6.1*  ALBUMIN 2.5* 2.5*   CBG: Recent Labs  Lab 04/04/22 1129 04/04/22 1345 04/04/22 1640 04/05/22 0652 04/05/22 0830  GLUCAP 110* 108* 105* 71 87    Discharge time spent: less than 30 minutes.  Signed: Annita Brod, MD [facility] 04/05/2022

## 2022-04-05 NOTE — TOC Progression Note (Addendum)
Transition of Care [facility]) - Progression Note    Patient Details  Name: Leslie Houston MRN: [id number] Date of Birth: [birth year]  Transition of Care [facility]) CM/SW [place], [place] Phone Number: 04/05/2022, 12:05 PM  Clinical Narrative:     [facility] met with the patient and daughter Leslie Houston [phone] at bedside. This patient is current with [facility] and will need HHPT/OT orders. The patient has orders for a RW.  Daughter is asking about a transfer board. TOC reaching out to PT/OT.  TOC requested walker and tub bench from [employer].  Patient does not want to pay for the DME and will obtain DME elsewhere.  Expected Discharge Plan: Home/Self Care Barriers to Discharge: Continued Medical Work up  Expected Discharge Plan and Services Expected Discharge Plan: Home/Self Care       Living arrangements for the past 2 months: [place]                   Social Determinants of Health (SDOH) Interventions Food Insecurity Interventions: Inpatient TOC  Readmission Risk Interventions    03/17/2022   11:49 AM  Readmission Risk Prevention Plan  Transportation Screening Complete  PCP or Specialist Appt within 3-5 Days Complete  HRI or [place] Complete  Social Work Consult for [place] Planning/Counseling Complete  Palliative Care Screening Not Applicable  Medication Review Press photographer) Complete

## 2022-04-05 NOTE — Progress Notes (Signed)
Subjective:  CC: Leslie Houston is a 71 y.o. female  Hospital stay day 22, 14 Days Post-Op partial SBO versus ileus  HPI: No issues, tolerating regular diet  ROS:  General: Denies weight loss, weight gain, fatigue, fevers, chills, and night sweats. Heart: Denies chest pain, palpitations, racing heart, irregular heartbeat, leg pain or swelling, and decreased activity tolerance. Respiratory: Denies breathing difficulty, shortness of breath, wheezing, cough, and sputum. GI: Denies change in appetite, heartburn, nausea, vomiting, constipation, diarrhea, and blood in stool. GU: Denies difficulty urinating, pain with urinating, urgency, frequency, blood in urine.   Objective:   Temp:  [97.3 F (36.3 C)-98.4 F (36.9 C)] 97.3 F (36.3 C) (12/07 0818) Pulse Rate:  [58-67] 59 (12/07 0818) Resp:  [16-18] 16 (12/07 0818) BP: (102-123)/(37-55) 112/52 (12/07 0818) SpO2:  [94 %-98 %] 98 % (12/07 0818) Weight:  [85.9 kg] 85.9 kg (12/07 0500)     Height: '5\' 11"'$  (180.3 cm) Weight: 85.9 kg BMI (Calculated): 26.42   Intake/Output this shift:   Intake/Output Summary (Last 24 hours) at 04/05/2022 1156 Last data filed at 04/05/2022 1024 Gross per 24 hour  Intake 2542.8 ml  Output 1265 ml  Net 1277.8 ml    Constitutional :  alert, cooperative, appears stated age, and no distress  Respiratory:  clear to auscultation bilaterally  Cardiovascular:  regular rate and rhythm  Gastrointestinal: soft, non-tender; bowel sounds normal; no masses,  no organomegaly.  Stable abdominal exam.  Skin: Cool and moist. incision line c/d/i  Psychiatric: Normal affect, non-agitated, not confused       LABS:     Latest Ref Rng & Units 04/05/2022    6:42 AM 04/04/2022    5:27 AM 04/03/2022    6:45 AM  CMP  Glucose 70 - 99 mg/dL 90  107  93   BUN 8 - 23 mg/dL 55  62  57   Creatinine 0.44 - 1.00 mg/dL 2.38  2.35  2.27   Sodium 135 - 145 mmol/L 135  133  135   Potassium 3.5 - 5.1 mmol/L 4.6  5.2  5.3   Chloride  98 - 111 mmol/L 112  110  112   CO2 22 - 32 mmol/L '19  16  19   '$ Calcium 8.9 - 10.3 mg/dL 8.1  8.4  8.5   Total Protein 6.5 - 8.1 g/dL 6.1     Total Bilirubin 0.3 - 1.2 mg/dL 0.7     Alkaline Phos 38 - 126 U/L 98     AST 15 - 41 U/L 15     ALT 0 - 44 U/L 17         Latest Ref Rng & Units 04/01/2022    3:40 AM 03/27/2022    5:18 AM 03/25/2022    5:00 AM  CBC  WBC 4.0 - 10.5 K/uL 6.8  6.7  8.8   Hemoglobin 12.0 - 15.0 g/dL 9.1  9.7  10.4   Hematocrit 36.0 - 46.0 % 27.6  30.7  32.6   Platelets 150 - 400 K/uL 211  185  226     RADS: N/a  Assessment:   S/p ex lap, lysis of adhesions.  Now with postop ileus, resolving  Tolerating regular diet. F/u prn  labs/images/medications/previous chart entries reviewed personally and relevant changes/updates noted above.

## 2022-04-05 NOTE — TOC Transition Note (Signed)
Transition of Care [facility]) - CM/SW Discharge Note   Patient Details  Name: Leslie Houston MRN: [id number] Date of Birth: 12/10/1950  [facility]) CM/SW Contact:  Colen Darling, [place] Phone Number: 04/05/2022, 2:45 PM   Clinical Narrative:     Patient declined DME. Patient will discharge home with [place] and [place].  Final next level of care: [place] Barriers to Discharge: Barriers Resolved   Patient Goals and CMS Choice Patient states their goals for this hospitalization and ongoing recovery are:: HH at daughter's house CMS Medicare.gov Compare Post Acute Care list provided to:: Patient Choice offered to / list presented to : Patient  Discharge Placement                 [place]      Discharge Plan and Services                DME Arranged: Walker rolling   Date DME Agency Contacted: 04/05/22 Time DME Agency Contacted: 1219 Representative spoke with at DME Agency: Sherilyn Cooter            Social Determinants of Health ([place]) Interventions Food Insecurity Interventions: Inpatient TOC   Readmission Risk Interventions    03/17/2022   11:49 AM  Readmission Risk Prevention Plan  Transportation Screening Complete  PCP or Specialist Appt within 3-5 Days Complete  HRI or [place] Complete  Social Work Consult for [place] Planning/Counseling Complete  Palliative Care Screening Not Applicable  Medication Review Press photographer) Complete

## 2022-04-05 NOTE — Plan of Care (Signed)

## 2022-04-05 NOTE — Plan of Care (Signed)
  Problem: Education: Goal: Knowledge of General Education information will improve Description: Including pain rating scale, medication(s)/side effects and non-pharmacologic comfort measures 04/05/2022 1615 by Mancel Bale, RN Outcome: Adequate for Discharge 04/05/2022 0856 by Mancel Bale, RN Outcome: Progressing   Problem: Health Behavior/Discharge Planning: Goal: Ability to manage health-related needs will improve 04/05/2022 1615 by Mancel Bale, RN Outcome: Adequate for Discharge 04/05/2022 0856 by Mancel Bale, RN Outcome: Progressing   Problem: Clinical Measurements: Goal: Ability to maintain clinical measurements within normal limits will improve 04/05/2022 1615 by Mancel Bale, RN Outcome: Adequate for Discharge 04/05/2022 0856 by Mancel Bale, RN Outcome: Progressing Goal: Will remain free from infection 04/05/2022 1615 by Mancel Bale, RN Outcome: Adequate for Discharge 04/05/2022 0856 by Mancel Bale, RN Outcome: Progressing Goal: Diagnostic test results will improve 04/05/2022 1615 by Mancel Bale, RN Outcome: Adequate for Discharge 04/05/2022 0856 by Mancel Bale, RN Outcome: Progressing Goal: Respiratory complications will improve 04/05/2022 1615 by Mancel Bale, RN Outcome: Adequate for Discharge 04/05/2022 0856 by Mancel Bale, RN Outcome: Progressing Goal: Cardiovascular complication will be avoided 04/05/2022 1615 by Mancel Bale, RN Outcome: Adequate for Discharge 04/05/2022 0856 by Mancel Bale, RN Outcome: Progressing   Problem: Activity: Goal: Risk for activity intolerance will decrease 04/05/2022 1615 by Mancel Bale, RN Outcome: Adequate for Discharge 04/05/2022 0856 by Mancel Bale, RN Outcome: Progressing   Problem: Nutrition: Goal: Adequate nutrition will be maintained 04/05/2022 1615 by Mancel Bale, RN Outcome: Adequate for Discharge 04/05/2022 0856 by Mancel Bale, RN Outcome:  Progressing   Problem: Coping: Goal: Level of anxiety will decrease 04/05/2022 1615 by Mancel Bale, RN Outcome: Adequate for Discharge 04/05/2022 0856 by Mancel Bale, RN Outcome: Progressing   Problem: Elimination: Goal: Will not experience complications related to bowel motility 04/05/2022 1615 by Mancel Bale, RN Outcome: Adequate for Discharge 04/05/2022 0856 by Mancel Bale, RN Outcome: Progressing Goal: Will not experience complications related to urinary retention 04/05/2022 1615 by Mancel Bale, RN Outcome: Adequate for Discharge 04/05/2022 0856 by Mancel Bale, RN Outcome: Progressing   Problem: Pain Managment: Goal: General experience of comfort will improve 04/05/2022 1615 by Mancel Bale, RN Outcome: Adequate for Discharge 04/05/2022 0856 by Mancel Bale, RN Outcome: Progressing   Problem: Safety: Goal: Ability to remain free from injury will improve 04/05/2022 1615 by Mancel Bale, RN Outcome: Adequate for Discharge 04/05/2022 0856 by Mancel Bale, RN Outcome: Progressing   Problem: Skin Integrity: Goal: Risk for impaired skin integrity will decrease 04/05/2022 1615 by Mancel Bale, RN Outcome: Adequate for Discharge 04/05/2022 0856 by Mancel Bale, RN Outcome: Progressing

## 2022-04-05 NOTE — Plan of Care (Signed)

## 2022-04-10 DIAGNOSIS — M8580 Other specified disorders of bone density and structure, unspecified site: Secondary | ICD-10-CM | POA: Diagnosis not present

## 2022-04-10 DIAGNOSIS — G4733 Obstructive sleep apnea (adult) (pediatric): Secondary | ICD-10-CM | POA: Diagnosis not present

## 2022-04-10 DIAGNOSIS — N184 Chronic kidney disease, stage 4 (severe): Secondary | ICD-10-CM | POA: Diagnosis not present

## 2022-04-10 DIAGNOSIS — F32A Depression, unspecified: Secondary | ICD-10-CM | POA: Diagnosis not present

## 2022-04-10 DIAGNOSIS — I2510 Atherosclerotic heart disease of native coronary artery without angina pectoris: Secondary | ICD-10-CM | POA: Diagnosis not present

## 2022-04-10 DIAGNOSIS — G40909 Epilepsy, unspecified, not intractable, without status epilepticus: Secondary | ICD-10-CM | POA: Diagnosis not present

## 2022-04-10 DIAGNOSIS — I69398 Other sequelae of cerebral infarction: Secondary | ICD-10-CM | POA: Diagnosis not present

## 2022-04-10 DIAGNOSIS — I6529 Occlusion and stenosis of unspecified carotid artery: Secondary | ICD-10-CM | POA: Diagnosis not present

## 2022-04-10 DIAGNOSIS — Z9181 History of falling: Secondary | ICD-10-CM | POA: Diagnosis not present

## 2022-04-10 DIAGNOSIS — F419 Anxiety disorder, unspecified: Secondary | ICD-10-CM | POA: Diagnosis not present

## 2022-04-10 DIAGNOSIS — I110 Hypertensive heart disease with heart failure: Secondary | ICD-10-CM | POA: Diagnosis not present

## 2022-04-10 DIAGNOSIS — Z9350 Unspecified cystostomy status: Secondary | ICD-10-CM | POA: Diagnosis not present

## 2022-04-10 DIAGNOSIS — H9190 Unspecified hearing loss, unspecified ear: Secondary | ICD-10-CM | POA: Diagnosis not present

## 2022-04-10 DIAGNOSIS — K279 Peptic ulcer, site unspecified, unspecified as acute or chronic, without hemorrhage or perforation: Secondary | ICD-10-CM | POA: Diagnosis not present

## 2022-04-10 DIAGNOSIS — E1122 Type 2 diabetes mellitus with diabetic chronic kidney disease: Secondary | ICD-10-CM | POA: Diagnosis not present

## 2022-04-10 DIAGNOSIS — I5030 Unspecified diastolic (congestive) heart failure: Secondary | ICD-10-CM | POA: Diagnosis not present

## 2022-04-10 DIAGNOSIS — H539 Unspecified visual disturbance: Secondary | ICD-10-CM | POA: Diagnosis not present

## 2022-04-10 DIAGNOSIS — F4310 Post-traumatic stress disorder, unspecified: Secondary | ICD-10-CM | POA: Diagnosis not present

## 2022-04-10 DIAGNOSIS — R339 Retention of urine, unspecified: Secondary | ICD-10-CM | POA: Diagnosis not present

## 2022-04-10 DIAGNOSIS — E785 Hyperlipidemia, unspecified: Secondary | ICD-10-CM | POA: Diagnosis not present

## 2022-04-10 DIAGNOSIS — F79 Unspecified intellectual disabilities: Secondary | ICD-10-CM | POA: Diagnosis not present

## 2022-04-10 DIAGNOSIS — Z48815 Encounter for surgical aftercare following surgery on the digestive system: Secondary | ICD-10-CM | POA: Diagnosis not present

## 2022-04-10 DIAGNOSIS — N1330 Unspecified hydronephrosis: Secondary | ICD-10-CM | POA: Diagnosis not present

## 2022-04-10 DIAGNOSIS — M1030 Gout due to renal impairment, unspecified site: Secondary | ICD-10-CM | POA: Diagnosis not present

## 2022-04-10 DIAGNOSIS — N319 Neuromuscular dysfunction of bladder, unspecified: Secondary | ICD-10-CM | POA: Diagnosis not present

## 2022-04-11 ENCOUNTER — Ambulatory Visit: Payer: Medicare Other | Admitting: Physician Assistant

## 2022-04-12 ENCOUNTER — Telehealth: Payer: Self-pay | Admitting: Internal Medicine

## 2022-04-12 DIAGNOSIS — I5030 Unspecified diastolic (congestive) heart failure: Secondary | ICD-10-CM | POA: Diagnosis not present

## 2022-04-12 DIAGNOSIS — E1122 Type 2 diabetes mellitus with diabetic chronic kidney disease: Secondary | ICD-10-CM | POA: Diagnosis not present

## 2022-04-12 DIAGNOSIS — I110 Hypertensive heart disease with heart failure: Secondary | ICD-10-CM | POA: Diagnosis not present

## 2022-04-12 DIAGNOSIS — M1030 Gout due to renal impairment, unspecified site: Secondary | ICD-10-CM | POA: Diagnosis not present

## 2022-04-12 DIAGNOSIS — N184 Chronic kidney disease, stage 4 (severe): Secondary | ICD-10-CM | POA: Diagnosis not present

## 2022-04-12 DIAGNOSIS — Z48815 Encounter for surgical aftercare following surgery on the digestive system: Secondary | ICD-10-CM | POA: Diagnosis not present

## 2022-04-12 NOTE — Telephone Encounter (Signed)
Home Health Verbal Orders - Caller/Agency: [facility] [place] Number: [phone]  Requesting OT/PT/Skilled Nursing/Social Work/Speech Therapy:   PT Orders  Frequency: 1 week 1  2 week 3 and 1 week 5.  Skilled nurse evaluation also

## 2022-04-13 ENCOUNTER — Encounter: Payer: Self-pay | Admitting: Physician Assistant

## 2022-04-13 ENCOUNTER — Ambulatory Visit (INDEPENDENT_AMBULATORY_CARE_PROVIDER_SITE_OTHER): Payer: Medicare Other | Admitting: Physician Assistant

## 2022-04-13 VITALS — BP 124/70 | HR 66 | Ht 66.0 in | Wt 177.0 lb

## 2022-04-13 DIAGNOSIS — R339 Retention of urine, unspecified: Secondary | ICD-10-CM

## 2022-04-13 DIAGNOSIS — N1330 Unspecified hydronephrosis: Secondary | ICD-10-CM | POA: Diagnosis not present

## 2022-04-13 DIAGNOSIS — R300 Dysuria: Secondary | ICD-10-CM | POA: Diagnosis not present

## 2022-04-13 LAB — URINALYSIS, COMPLETE
Bilirubin, UA: NEGATIVE
Glucose, UA: NEGATIVE
Ketones, UA: NEGATIVE
Nitrite, UA: NEGATIVE
Specific Gravity, UA: 1.015 (ref 1.005–1.030)
Urobilinogen, Ur: 0.2 mg/dL (ref 0.2–1.0)
pH, UA: 6.5 (ref 5.0–7.5)

## 2022-04-13 LAB — MICROSCOPIC EXAMINATION
Epithelial Cells (non renal): >10 /hpf — AB (ref 0–10)
WBC, UA: >30 /hpf — AB (ref 0–5)

## 2022-04-16 NOTE — Progress Notes (Signed)
04/13/2022 4:45 PM   Leslie Houston 02-02-1951 [id number]  CC: Chief Complaint  Patient presents with   Follow-up   Urinary Retention   HPI: Leslie Houston is a 71 y.o. female with PMH chronic urinary retention and urethral stricture s/p pelvic exam under anesthesia with suprapubic catheter placement and urethral dilation with Dr. Erlene Quan on 03/12/2022 admitted afterward with partial SBO versus ileus who presents today for outpatient follow-up and bladder scan after passing an inpatient voiding trial.  She is accompanied today by her daughter, Leslie Houston, who contributes to HPI.  Today she reports she continues to void spontaneously but has been having urinary leakage from her SP tract and urethra.  They have been managing this with absorbent products and wonder if they can get a prescription to cover these as well as dressings for around her SPT site.  She also continues to have some malodorous urine, but she denies constant low belly pain, back pain, fever, chills, nausea, or vomiting.  She declines bladder scan today.  SPT is plugged on arrival, it was attached to a drainage bag to measure her residual and immediately drained 350 mL of cloudy yellow urine.  In office UA today with 1+ blood, 1+ protein, and 3+ leukocytes; urine microscopy with >30 WBCs/hpf, >10 epithelial cells/hpf, and many bacteria.  PMH: Past Medical History:  Diagnosis Date   (HFpEF) heart failure with preserved ejection fraction ([place])    a. Pt reports in 2000 she had CHF, details unclear, outside hospital; b. 06/2019 Echo: EF 50-55%, no rwma, Gr2 DD,  mildly dil LA, mild MR.   Acute left PCA stroke (HCC) 06/22/2014   Anxiety    a.) on BZO (alprazolam) PRN   CAD (coronary artery disease)    a. Pt reports in 2000 she had a stent to a coronary artery, details unclear, outside hospital.   Carotid stenosis    a. CT angio head 07/2014 - 50-60% stenoses of prox ICA bilaterally.   CKD (chronic kidney disease),  stage IV ([place])    Depression    Diabetes mellitus ([place])    Denies.   Edentulous    No upper teeth   Family hx of colon cancer 06/13/2018   Father diagnosed in his 44's   Gout    History of cataract extraction, left 03/21/2021   HOH (hard of hearing)    usually needs to read lips   Hyperlipidemia    Hypertension    Intellectual disability    a.) born premature, limited formal education, has never driven   Myalgia due to statin    Orthostatic hypotension    OSA (obstructive sleep apnea)    a.) not on nocturnal PAP therapy; old/nonfuntioning DME - referred for repeat PSG 01/2022 by neurology   Osteopenia 01/24/2018   DEXA Sept 2019   PONV (postoperative nausea and vomiting)    PTSD (post-traumatic stress disorder)        PUD (peptic ulcer disease)    PVC's (premature ventricular contractions)    Right sided weakness 05/2014   a.) s/p CVA   Seizures ([place])    Sinus bradycardia    Syncope    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)     Surgical History: Past Surgical History:  Procedure Laterality Date   BLADDER SURGERY     CATARACT EXTRACTION W/PHACO Left 03/21/2021   Procedure: CATARACT EXTRACTION PHACO AND INTRAOCULAR LENS PLACEMENT ([place]) left 5.86 00:38.0;  Surgeon: Birder Robson, MD;  Location: [facility];  Service:  Ophthalmology;  Laterality: Left;  Latex   CESAREAN SECTION     CORONARY ANGIOPLASTY WITH STENT PLACEMENT     CYSTOSCOPY N/A 03/12/2022   Procedure: CYSTOSCOPY;  Surgeon: [name], MD;  Location: [facility];  Service: Urology;  Laterality: N/A;   DENTAL SURGERY     INSERTION OF SUPRAPUBIC CATHETER N/A 03/12/2022   Procedure: INSERTION OF SUPRAPUBIC CATHETER;  Surgeon: [name], MD;  Location: [facility];  Service: Urology;  Laterality: N/A;   LAPAROTOMY N/A 03/22/2022   Procedure: EXPLORATORY LAPAROTOMY; LYSIS OF ADHESIONS;  Surgeon: Benjamine Sprague, DO;  Location: [facility];  Service: General;  Laterality: N/A;   RECTAL  EXAM UNDER ANESTHESIA N/A 03/12/2022   Procedure: PELVIC EXAM UNDER ANESTHESIA;  Surgeon: [name], MD;  Location: [facility];  Service: Urology;  Laterality: N/A;    Home Medications:  Allergies as of 04/13/2022       Reactions   Latex Rash   Penicillin G Itching   Atorvastatin Other (See Comments)   Myalgias    Other Hives   Adhesive tape   Amlodipine Swelling   Leg swelling   Doxycycline    Blood in urine        Medication List        Accurate as of April 13, 2022 11:59 PM. If you have any questions, ask your nurse or doctor.          citalopram 20 MG tablet Commonly known as: CeleXA Take 1 tablet (20 mg total) by mouth daily.   feeding supplement Liqd Take 237 mLs by mouth 2 (two) times daily between meals.   ferrous sulfate 325 (65 FE) MG tablet Take 325 mg by mouth daily with breakfast.   fluticasone 50 MCG/ACT nasal spray Commonly known as: FLONASE Place 2 sprays into both nostrils daily as needed for allergies (allergies).   levETIRAcetam 1000 MG tablet Commonly known as: KEPPRA Take 1 tablet (1,000 mg total) by mouth 2 (two) times daily.   loratadine 10 MG tablet Commonly known as: CLARITIN TAKE 1 TABLET BY MOUTH ONCE A DAY AS NEEDED FOR ALLERGIES   multivitamin with minerals Tabs tablet Take 1 tablet by mouth daily.   pantoprazole 20 MG tablet Commonly known as: PROTONIX TAKE 1 TABLET BY MOUTH ONCE A DAY   polyethylene glycol 17 g packet Commonly known as: MIRALAX / GLYCOLAX Take 17 g by mouth daily.   tamsulosin 0.4 MG Caps capsule Commonly known as: FLOMAX Take 1 capsule (0.4 mg total) by mouth daily. Take one in the morning and one at night.   torsemide 20 MG tablet Commonly known as: DEMADEX TAKE 1 TABLET BY MOUTH ONCE DAILY   Vitamin D3 25 MCG (1000 UT) Caps Take 1 capsule by mouth daily.               Durable Medical Equipment  (From admission, onward)           Start     Ordered   04/13/22 0000  For  home use only DME Other see comment       Comments: Patient to use: Diagnosis R33.9, N13.30 chucks, pads twice at night. Split gauze bandages three times daily Paper tape  1 box of Nitrile gloves size medium Depends 5 times daily  Question:  Length of Need  Answer:  6 Months   04/13/22 1514            Allergies:  Allergies  Allergen Reactions   Latex Rash   Penicillin  G Itching   Atorvastatin Other (See Comments)    Myalgias    Other Hives    Adhesive tape   Amlodipine Swelling    Leg swelling   Doxycycline     Blood in urine    Family History: Family History  Problem Relation Age of Onset   Stroke Mother    Heart attack Mother    Lung cancer Mother    Stroke Father    Prostate cancer Father    Colon cancer Father    Diabetes Mellitus II Daughter    Multiple sclerosis Daughter    Kidney disease Son    Heart failure Son    Cancer Paternal Grandfather     Social History:   reports that she quit smoking about 8 years ago. Her smoking use included cigarettes. She has been exposed to tobacco smoke. She has never used smokeless tobacco. She reports that she does not drink alcohol and does not use drugs.  Physical Exam: BP 124/70   Pulse 66   Ht '5\' 6"'$  (1.676 m)   Wt 177 lb (80.3 kg)   BMI 28.57 kg/m   Constitutional:  Alert and oriented, no acute distress, nontoxic appearing HEENT: [place], AT Cardiovascular: No clubbing, cyanosis, or edema Respiratory: Normal respiratory effort, no increased work of breathing GU: Suprapubic tract is healthy appearing with no erythema or purulent drainage.  It is covered with urine soaked gauze.  Catheter is plugged. Skin: No rashes, bruises or suspicious lesions Neurologic: Grossly intact, no focal deficits, moving all 4 extremities Psychiatric: Normal mood and affect  Laboratory Data: Results for orders placed or performed in visit on 04/13/22  Microscopic Examination   Urine  Result Value Ref Range   WBC, UA >30 (A) 0 - 5  /hpf   RBC, Urine 0-2 0 - 2 /hpf   Epithelial Cells (non renal) >10 (A) 0 - 10 /hpf   Bacteria, UA Many (A) None seen/Few  Urinalysis, Complete  Result Value Ref Range   Specific Gravity, UA 1.015 1.005 - 1.030   pH, UA 6.5 5.0 - 7.5   Color, UA Yellow Yellow   Appearance Ur Cloudy (A) Clear   Leukocytes,UA 3+ (A) Negative   Protein,UA 1+ (A) Negative/Trace   Glucose, UA Negative Negative   Ketones, UA Negative Negative   RBC, UA 1+ (A) Negative   Bilirubin, UA Negative Negative   Urobilinogen, Ur 0.2 0.2 - 1.0 mg/dL   Nitrite, UA Negative Negative   Microscopic Examination See below:    Assessment & Plan:   1. Urinary retention She was noted to have an elevated residual today even though she continues to void spontaneously.  I think this could be contributing to her urinary leakage per urethra and from around her suprapubic catheter.  At this point, I recommend keeping the suprapubic catheter attached for drainage and they are in agreement with this plan.  I demonstrated how to switch back and forth between the leg and night bag and provided them with a prescription for wound dressings and absorbent products for home use.  I do not think she is clinically infected today, though will send urine for culture and reach out with results when available, treating as indicated. - Urinalysis, Complete - CULTURE, URINE COMPREHENSIVE - For home use only DME Other see comment - Microscopic Examination  Return for Will call with results.  Debroah Loop, PA-C  [facility] [address], [place], [place] [address] [phone]

## 2022-04-18 DIAGNOSIS — E1122 Type 2 diabetes mellitus with diabetic chronic kidney disease: Secondary | ICD-10-CM | POA: Diagnosis not present

## 2022-04-18 DIAGNOSIS — I110 Hypertensive heart disease with heart failure: Secondary | ICD-10-CM | POA: Diagnosis not present

## 2022-04-18 DIAGNOSIS — I5030 Unspecified diastolic (congestive) heart failure: Secondary | ICD-10-CM | POA: Diagnosis not present

## 2022-04-18 DIAGNOSIS — M1030 Gout due to renal impairment, unspecified site: Secondary | ICD-10-CM | POA: Diagnosis not present

## 2022-04-18 DIAGNOSIS — N184 Chronic kidney disease, stage 4 (severe): Secondary | ICD-10-CM | POA: Diagnosis not present

## 2022-04-18 DIAGNOSIS — Z48815 Encounter for surgical aftercare following surgery on the digestive system: Secondary | ICD-10-CM | POA: Diagnosis not present

## 2022-04-19 ENCOUNTER — Telehealth: Payer: Self-pay

## 2022-04-19 LAB — CULTURE, URINE COMPREHENSIVE

## 2022-04-19 MED ORDER — SULFAMETHOXAZOLE-TRIMETHOPRIM 800-160 MG PO TABS: 1.0000 | ORAL_TABLET | Freq: Two times a day (BID) | ORAL | 0 refills | Status: DC

## 2022-04-19 NOTE — Telephone Encounter (Signed)
Debroah Loop, PA-C  Naser Schuld, Weldon Picking, CMA Please contact [name] and ask if Ms. Qian is having any low belly pain, low back pain, fever, chills, nausea, or vomiting. If so, please send in Bactrim DS BID x7 days. Otherwise this urine culture likely represents colonization in the setting of her suprapubic catheter and does not require treatment.  Spoke with [name] and she states patient has been having right lower abdominal pain, foul smell and discolored urine. RX sent in to the pharmacy.

## 2022-04-20 DIAGNOSIS — Z48815 Encounter for surgical aftercare following surgery on the digestive system: Secondary | ICD-10-CM | POA: Diagnosis not present

## 2022-04-20 DIAGNOSIS — I110 Hypertensive heart disease with heart failure: Secondary | ICD-10-CM | POA: Diagnosis not present

## 2022-04-20 DIAGNOSIS — N184 Chronic kidney disease, stage 4 (severe): Secondary | ICD-10-CM | POA: Diagnosis not present

## 2022-04-20 DIAGNOSIS — M1030 Gout due to renal impairment, unspecified site: Secondary | ICD-10-CM | POA: Diagnosis not present

## 2022-04-20 DIAGNOSIS — I5030 Unspecified diastolic (congestive) heart failure: Secondary | ICD-10-CM | POA: Diagnosis not present

## 2022-04-20 DIAGNOSIS — E1122 Type 2 diabetes mellitus with diabetic chronic kidney disease: Secondary | ICD-10-CM | POA: Diagnosis not present

## 2022-04-24 ENCOUNTER — Emergency Department: Payer: Medicare Other

## 2022-04-24 ENCOUNTER — Other Ambulatory Visit: Payer: Self-pay

## 2022-04-24 ENCOUNTER — Emergency Department
Admission: EM | Admit: 2022-04-24 | Discharge: 2022-04-24 | Disposition: A | Discharge status: Home / Self Care | Payer: Medicare Other | Attending: Emergency Medicine | Admitting: Emergency Medicine

## 2022-04-24 DIAGNOSIS — N179 Acute kidney failure, unspecified: Secondary | ICD-10-CM | POA: Diagnosis not present

## 2022-04-24 DIAGNOSIS — N184 Chronic kidney disease, stage 4 (severe): Secondary | ICD-10-CM | POA: Insufficient documentation

## 2022-04-24 DIAGNOSIS — I959 Hypotension, unspecified: Secondary | ICD-10-CM | POA: Diagnosis not present

## 2022-04-24 DIAGNOSIS — R1084 Generalized abdominal pain: Secondary | ICD-10-CM | POA: Diagnosis not present

## 2022-04-24 DIAGNOSIS — Z20822 Contact with and (suspected) exposure to covid-19: Secondary | ICD-10-CM | POA: Diagnosis not present

## 2022-04-24 DIAGNOSIS — R109 Unspecified abdominal pain: Secondary | ICD-10-CM | POA: Diagnosis not present

## 2022-04-24 DIAGNOSIS — I2510 Atherosclerotic heart disease of native coronary artery without angina pectoris: Secondary | ICD-10-CM | POA: Insufficient documentation

## 2022-04-24 DIAGNOSIS — I130 Hypertensive heart and chronic kidney disease with heart failure and stage 1 through stage 4 chronic kidney disease, or unspecified chronic kidney disease: Secondary | ICD-10-CM | POA: Diagnosis not present

## 2022-04-24 DIAGNOSIS — I700 Atherosclerosis of aorta: Secondary | ICD-10-CM | POA: Diagnosis not present

## 2022-04-24 DIAGNOSIS — I5030 Unspecified diastolic (congestive) heart failure: Secondary | ICD-10-CM | POA: Diagnosis not present

## 2022-04-24 DIAGNOSIS — R1031 Right lower quadrant pain: Secondary | ICD-10-CM | POA: Diagnosis present

## 2022-04-24 DIAGNOSIS — E1122 Type 2 diabetes mellitus with diabetic chronic kidney disease: Secondary | ICD-10-CM | POA: Diagnosis not present

## 2022-04-24 DIAGNOSIS — R001 Bradycardia, unspecified: Secondary | ICD-10-CM | POA: Diagnosis not present

## 2022-04-24 LAB — URINALYSIS, ROUTINE W REFLEX MICROSCOPIC
Bilirubin Urine: NEGATIVE
Glucose, UA: NEGATIVE mg/dL
Hgb urine dipstick: NEGATIVE
Ketones, ur: NEGATIVE mg/dL
Nitrite: NEGATIVE
Protein, ur: 30 mg/dL — AB
Specific Gravity, Urine: 1.012 (ref 1.005–1.030)
WBC, UA: >50 WBC/hpf — ABNORMAL HIGH (ref 0–5)
pH: 6 (ref 5.0–8.0)

## 2022-04-24 LAB — CBC
HCT: 32.3 % — ABNORMAL LOW (ref 36.0–46.0)
Hemoglobin: 10.1 g/dL — ABNORMAL LOW (ref 12.0–15.0)
MCH: 32.1 pg (ref 26.0–34.0)
MCHC: 31.3 g/dL (ref 30.0–36.0)
MCV: 102.5 fL — ABNORMAL HIGH (ref 80.0–100.0)
Platelets: 244 10*3/uL (ref 150–400)
RBC: 3.15 MIL/uL — ABNORMAL LOW (ref 3.87–5.11)
RDW: 12.9 % (ref 11.5–15.5)
WBC: 4.5 10*3/uL (ref 4.0–10.5)
nRBC: 0 % (ref 0.0–0.2)

## 2022-04-24 LAB — RESP PANEL BY RT-PCR (RSV, FLU A&B, COVID)  RVPGX2
Influenza A by PCR: NEGATIVE
Influenza B by PCR: NEGATIVE
Resp Syncytial Virus by PCR: NEGATIVE
SARS Coronavirus 2 by RT PCR: NEGATIVE

## 2022-04-24 LAB — LIPASE, BLOOD: Lipase: 39 U/L (ref 11–51)

## 2022-04-24 LAB — COMPREHENSIVE METABOLIC PANEL
ALT: 23 U/L (ref 0–44)
AST: 19 U/L (ref 15–41)
Albumin: 3.3 g/dL — ABNORMAL LOW (ref 3.5–5.0)
Alkaline Phosphatase: 88 U/L (ref 38–126)
Anion gap: 12 (ref 5–15)
BUN: 32 mg/dL — ABNORMAL HIGH (ref 8–23)
CO2: 21 mmol/L — ABNORMAL LOW (ref 22–32)
Calcium: 9.1 mg/dL (ref 8.9–10.3)
Chloride: 105 mmol/L (ref 98–111)
Creatinine, Ser: 3.54 mg/dL — ABNORMAL HIGH (ref 0.44–1.00)
GFR, Estimated: 13 mL/min — ABNORMAL LOW (ref >60)
Glucose, Bld: 103 mg/dL — ABNORMAL HIGH (ref 70–99)
Potassium: 2.9 mmol/L — ABNORMAL LOW (ref 3.5–5.1)
Sodium: 138 mmol/L (ref 135–145)
Total Bilirubin: 0.8 mg/dL (ref 0.3–1.2)
Total Protein: 7.3 g/dL (ref 6.5–8.1)

## 2022-04-24 LAB — LACTIC ACID, PLASMA: Lactic Acid, Venous: 1.2 mmol/L (ref 0.5–1.9)

## 2022-04-24 MED ORDER — POTASSIUM CHLORIDE CRYS ER 20 MEQ PO TBCR
40.0000 meq | EXTENDED_RELEASE_TABLET | Freq: Once | ORAL | Status: AC
  Administered 2022-04-24: 40 meq via ORAL
  Filled 2022-04-24: qty 2

## 2022-04-24 MED ORDER — POTASSIUM CHLORIDE 10 MEQ/100ML IV SOLN
10.0000 meq | INTRAVENOUS | Status: AC
  Administered 2022-04-24 (×2): 10 meq via INTRAVENOUS
  Filled 2022-04-24 (×2): qty 100

## 2022-04-24 MED ORDER — LACTATED RINGERS IV BOLUS
1000.0000 mL | Freq: Once | INTRAVENOUS | Status: AC
  Administered 2022-04-24: 1000 mL via INTRAVENOUS

## 2022-04-24 MED ORDER — HYDROMORPHONE HCL 1 MG/ML IJ SOLN
0.5000 mg | Freq: Once | INTRAMUSCULAR | Status: AC
  Administered 2022-04-24: 0.5 mg via INTRAVENOUS
  Filled 2022-04-24: qty 0.5

## 2022-04-24 MED ORDER — LEVETIRACETAM 500 MG PO TABS
1000.0000 mg | ORAL_TABLET | Freq: Once | ORAL | Status: AC
  Administered 2022-04-24: 1000 mg via ORAL
  Filled 2022-04-24: qty 2

## 2022-04-24 MED ORDER — ONDANSETRON 4 MG PO TBDP: 4.0000 mg | ORAL_TABLET | Freq: Three times a day (TID) | ORAL | 0 refills | Status: DC | PRN

## 2022-04-24 NOTE — Discharge Instructions (Signed)
Please continue to take your antibiotic.  Please follow-up with Dr. Cherrie Gauze office on Tuesday as scheduled.  Please return to the emergency department for fever intractable vomiting or worsening belly pain.

## 2022-04-24 NOTE — ED Provider Notes (Addendum)
[facility] Provider Note    Event Date/Time   First MD Initiated Contact with Patient 04/24/22 [phone]     (approximate)   History   Abdominal Pain and possible seizure   HPI  Leslie Houston is a 71 y.o. female past medical history of CKD, depression, CVA coronary disease who presents because of abdominal pain.  Had suprapubic catheter placed for chronic urine retention on 11/13.  Presented to the ED on 11/15 with severe abdominal pain and was found to have SBO requiring ex lap with lysis of adhesions.  During hospitalization she was given empiric IV antibiotics due to concern for UTI.  He was discharged on 12/7.  Abdominal pain since leaving the hospital but has been worse over the last 2 days.  Is primarily in the lower quadrants worse on the right side.  Patient was seen by urology on 12/15.  On that note looks like she had been previously voiding spontaneously with the suprapubic tube plugged.  She was noted to have urinary retention and thus it was recommended that she keep the suprapubic cath attached to drainage.  UA was sent at that time and has grown E. coli and Klebsiella she was called in prescription for Bactrim.  Patient denies fevers or chills.  Not currently having vomiting is having normal bowel movements.  Her daughter said this morning she had a "mini seizure.  Says that she was sitting on the bed when she suddenly was staring off and eyes rolled back.  Did not have any shaking.  Says that her seizures typically look at this.     Past Medical History:  Diagnosis Date   (HFpEF) heart failure with preserved ejection fraction ([place])    a. Pt reports in 2000 she had CHF, details unclear, outside hospital; b. 06/2019 Echo: EF 50-55%, no rwma, Gr2 DD,  mildly dil LA, mild MR.   Acute left PCA stroke (HCC) 06/22/2014   Anxiety    a.) on BZO (alprazolam) PRN   CAD (coronary artery disease)    a. Pt reports in 2000 she had a stent to a coronary artery,  details unclear, outside hospital.   Carotid stenosis    a. CT angio head 07/2014 - 50-60% stenoses of prox ICA bilaterally.   CKD (chronic kidney disease), stage IV ([place])    Depression    Diabetes mellitus ([place])    Denies.   Edentulous    No upper teeth   Family hx of colon cancer 06/13/2018   Father diagnosed in his 68's   Gout    History of cataract extraction, left 03/21/2021   HOH (hard of hearing)    usually needs to read lips   Hyperlipidemia    Hypertension    Intellectual disability    a.) born premature, limited formal education, has never driven   Myalgia due to statin    Orthostatic hypotension    OSA (obstructive sleep apnea)    a.) not on nocturnal PAP therapy; old/nonfuntioning DME - referred for repeat PSG 01/2022 by neurology   Osteopenia 01/24/2018   DEXA Sept 2019   PONV (postoperative nausea and vomiting)    PTSD (post-traumatic stress disorder)        PUD (peptic ulcer disease)    PVC's (premature ventricular contractions)    Right sided weakness 05/2014   a.) s/p CVA   Seizures ([place])    Sinus bradycardia    Syncope    Visual disturbance as complication of stroke (RIGHT  eye)     Patient Active Problem List   Diagnosis Date Noted   Hypernatremia 03/16/2022   Small bowel obstruction ([place]) 03/14/2022   Cerebrovascular accident (CVA) ([place]) 02/15/2022   Gait abnormality 02/15/2022   Urinary retention 02/15/2022   OSA (obstructive sleep apnea) 02/15/2022   History of CVA (cerebrovascular accident) 08/24/2021   Moderate episode of recurrent major depressive disorder ([place]) 06/28/2021   Statin myopathy 03/09/2020   Lymphedema 03/09/2020   Partial symptomatic epilepsy with complex partial seizures, not intractable, without status epilepticus ([place]) 02/18/2020   Bad dreams 02/18/2020   Chronic post-traumatic stress disorder (PTSD) 02/18/2020   Snoring 02/18/2020   Coronary artery disease 02/18/2020   Stroke ([place])    Debility 07/07/2019   Hyperkalemia  07/03/2019   AKI (acute kidney injury) ([place]) 07/02/2019   Anemia of chronic disease 07/02/2019   Bradycardia 07/02/2019   Acute CHF (congestive heart failure) ([place]) 07/02/2019   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   History of diabetes mellitus 01/22/2019   Osteopenia 01/24/2018   Anxiety 12/03/2017   Neutropenia ([place]) 12/31/2015   Seizures (HCC)    Carotid stenosis    PVC's (premature ventricular contractions)    CKD (chronic kidney disease) stage 4, GFR 15-29 ml/min ([place]) 08/19/2014   Morbid obesity ([place]) 03/04/2006   Essential hypertension 03/04/2006   GERD 03/04/2006   History of gastric ulcer 03/04/2006     Physical Exam  Triage Vital Signs: ED Triage Vitals  Enc Vitals Group     BP 04/24/22 0544 (!) 110/54     Pulse Rate 04/24/22 0544 63     Resp 04/24/22 0544 17     Temp 04/24/22 0544 97.9 F (36.6 C)     Temp Source 04/24/22 0544 Oral     SpO2 04/24/22 0544 96 %     Weight 04/24/22 0547 177 lb (80.3 kg)     Height 04/24/22 0547 '5\' 1"'$  (1.549 m)     Head Circumference --      Peak Flow --      Pain Score 04/24/22 0547 6     Pain Loc --      Pain Edu? --      Excl. in [place]? --     Most recent vital signs: Vitals:   04/24/22 1000 04/24/22 1216  BP: (!) 111/59 (!) 109/52  Pulse: (!) 54 (!) 52  Resp: 19 16  Temp: 97.9 F (36.6 C)   SpO2: 98% 100%     General: Awake, no distress.  CV:  Good peripheral perfusion.  Resp:  Normal effort.  Abd:  No distention.  Lower vertical surgical scar, mild tenderness throughout the lower abdomen especially in the right lower quadrant with some voluntary guarding Neuro:             Awake, Alert, Oriented x 3  Other:  Suprapubic catheter in place, there is scant drainage around the catheter, no erythema surrounding, yellow urine draining the bag   ED Results / Procedures / Treatments  Labs (all labs ordered are listed, but only abnormal results are displayed) Labs Reviewed  COMPREHENSIVE METABOLIC PANEL - Abnormal; Notable  for the following components:      Result Value   Potassium 2.9 (*)    CO2 21 (*)    Glucose, Bld 103 (*)    BUN 32 (*)    Creatinine, Ser 3.54 (*)    Albumin 3.3 (*)    GFR, Estimated 13 (*)    All other components within normal  limits  CBC - Abnormal; Notable for the following components:   RBC 3.15 (*)    Hemoglobin 10.1 (*)    HCT 32.3 (*)    MCV 102.5 (*)    All other components within normal limits  URINALYSIS, ROUTINE W REFLEX MICROSCOPIC - Abnormal; Notable for the following components:   Color, Urine YELLOW (*)    APPearance HAZY (*)    Protein, ur 30 (*)    Leukocytes,Ua LARGE (*)    WBC, UA >50 (*)    Bacteria, UA RARE (*)    All other components within normal limits  RESP PANEL BY RT-PCR (RSV, FLU A&B, COVID)  RVPGX2  URINE CULTURE  LIPASE, BLOOD  LACTIC ACID, PLASMA  LACTIC ACID, PLASMA     EKG     RADIOLOGY    PROCEDURES:  Critical Care performed: No  Procedures   MEDICATIONS ORDERED IN ED: Medications  potassium chloride 10 mEq in 100 mL IVPB (10 mEq Intravenous New Bag/Given 04/24/22 1146)  lactated ringers bolus 1,000 mL (0 mLs Intravenous Stopped 04/24/22 1142)  HYDROmorphone (DILAUDID) injection 0.5 mg (0.5 mg Intravenous Given 04/24/22 0952)  potassium chloride SA (KLOR-CON M) CR tablet 40 mEq (40 mEq Oral Given 04/24/22 0951)  levETIRAcetam (KEPPRA) tablet 1,000 mg (1,000 mg Oral Given 04/24/22 1221)     IMPRESSION / MDM / ASSESSMENT AND PLAN / ED COURSE  I reviewed the triage vital signs and the nursing notes.                              Patient's presentation is most consistent with acute presentation with potential threat to life or bodily function.  Differential diagnosis includes, but is not limited to, postop complication, UTI, pyelonephritis, sepsis, SBO, perforation  The patient is a 71 year old female with recent urinary retention status post suprapubic catheter placement as well as SBO following suprapubic catheter  placement requiring lysis of adhesions.  She is presenting with lower abdominal pain that has been going on since surgery but is worsened over the last 2 days.  She has followed up with urology and was previously capping suprapubic catheter and voiding spontaneously however at last urology visit on 12/15 she had significant retention so the suprapubic catheter was attached to bag again she is now been voiding via that only.  Her daughter notes that there has been some drainage around the catheter and she is currently on Bactrim as urine culture from that visit grew Klebsiella and E. coli giving lower abdominal pain she was treated.  Has not had fevers or vomiting is having normal bowel movements.  Patient's vitals are notable for low diastolic pressure but MAP is above 65 but is otherwise reassuring.  She does have diffuse abdominal tenderness primarily in the lower quadrants with some voluntary guarding.  The suprapubic catheter is draining yellow urine and there is no evidence of surrounding skin infection.    His labs are notable for AKI creatinine 3 5 from baseline of around 2.7.  This could be in part related to the Bactrim she is on.  She is also hypokalemic with potassium 2.9 which will supplement.  She has no leukocytosis negative lactate.  Catheter was placed just about 6 weeks ago so would like to avoid exchanging it.  Do think she needs a CT of the abdomen given recent surgery and ongoing pain.  Will give fluid bolus.  Will likely touch base with urology pending CT  results.    CT abdomen pelvis has no acute findings.  I did touch base with Dr. Bernardo Heater with urology and he agrees with staying the course continuing Bactrim and does not feel that exchanging the suprapubic tube is necessary today, as it is draining.  I agree that given patient has no leukocytosis afebrile that there is really no indication to escalate antibiotics or change the tube.  Patient had urinalysis sent here from the bag which  was greater than 50 white cells which showed I would expect.  I have sent for urine culture.  Patient did receive IV fluids for AKI.  Discussed that she will likely need to have repeat BMP when she follows up with Dr. Erlene Quan.  Discussed maintaining hydration.  Also discussed returning to the ED for fever confusion or worsening belly pain which could be signs of systemic infection.   FINAL CLINICAL IMPRESSION(S) / ED DIAGNOSES   Final diagnoses:  Generalized abdominal pain  AKI (acute kidney injury) ([place])     Rx / DC Orders   ED Discharge Orders          Ordered    ondansetron (ZOFRAN-ODT) 4 MG disintegrating tablet  Every 8 hours PRN        04/24/22 1235             Note:  This document was prepared using Dragon voice recognition software and may include unintentional dictation errors.   Rada Hay, MD 04/24/22 1235    Rada Hay, MD 04/24/22 [phone]

## 2022-04-24 NOTE — ED Triage Notes (Signed)
Pt complains of generalized abd pain around previous abd surgical site, "feeling like" she was going to have a seizure tonight and flu positive family members. Pt appears in no acute distress.

## 2022-04-25 DIAGNOSIS — I110 Hypertensive heart disease with heart failure: Secondary | ICD-10-CM | POA: Diagnosis not present

## 2022-04-25 DIAGNOSIS — E1122 Type 2 diabetes mellitus with diabetic chronic kidney disease: Secondary | ICD-10-CM | POA: Diagnosis not present

## 2022-04-25 DIAGNOSIS — I5030 Unspecified diastolic (congestive) heart failure: Secondary | ICD-10-CM | POA: Diagnosis not present

## 2022-04-25 DIAGNOSIS — N184 Chronic kidney disease, stage 4 (severe): Secondary | ICD-10-CM | POA: Diagnosis not present

## 2022-04-25 DIAGNOSIS — M1030 Gout due to renal impairment, unspecified site: Secondary | ICD-10-CM | POA: Diagnosis not present

## 2022-04-25 DIAGNOSIS — Z48815 Encounter for surgical aftercare following surgery on the digestive system: Secondary | ICD-10-CM | POA: Diagnosis not present

## 2022-04-25 LAB — URINE CULTURE: Culture: NO GROWTH

## 2022-04-26 ENCOUNTER — Other Ambulatory Visit: Payer: Self-pay | Admitting: Internal Medicine

## 2022-04-26 DIAGNOSIS — K219 Gastro-esophageal reflux disease without esophagitis: Secondary | ICD-10-CM

## 2022-04-26 DIAGNOSIS — M1030 Gout due to renal impairment, unspecified site: Secondary | ICD-10-CM | POA: Diagnosis not present

## 2022-04-26 DIAGNOSIS — I5030 Unspecified diastolic (congestive) heart failure: Secondary | ICD-10-CM | POA: Diagnosis not present

## 2022-04-26 DIAGNOSIS — Z48815 Encounter for surgical aftercare following surgery on the digestive system: Secondary | ICD-10-CM | POA: Diagnosis not present

## 2022-04-26 DIAGNOSIS — I110 Hypertensive heart disease with heart failure: Secondary | ICD-10-CM | POA: Diagnosis not present

## 2022-04-26 DIAGNOSIS — N184 Chronic kidney disease, stage 4 (severe): Secondary | ICD-10-CM | POA: Diagnosis not present

## 2022-04-26 DIAGNOSIS — E1122 Type 2 diabetes mellitus with diabetic chronic kidney disease: Secondary | ICD-10-CM | POA: Diagnosis not present

## 2022-04-26 DIAGNOSIS — F331 Major depressive disorder, recurrent, moderate: Secondary | ICD-10-CM

## 2022-04-26 NOTE — Telephone Encounter (Signed)
Requested Prescriptions  Pending Prescriptions Disp Refills   citalopram (CELEXA) 20 MG tablet [Pharmacy Med Name: CITALOPRAM HYDROBROMIDE 20 MG TAB] 90 tablet 0    Sig: TAKE 1 TABLET BY MOUTH ONCE A DAY     Psychiatry:  Antidepressants - SSRI Passed - 04/26/2022 10:44 AM      Passed - Completed PHQ-2 or PHQ-9 in the last 360 days      Passed - Valid encounter within last 6 months    Recent Outpatient Visits           4 months ago Dysfunction of both eustachian tubes   [place], DO   6 months ago Moderate episode of recurrent major depressive disorder [facility])   [place], DO   8 months ago Moderate episode of recurrent major depressive disorder [facility])   [facility] Teodora Medici, DO   10 months ago Moderate episode of recurrent major depressive disorder [facility])   [facility] Teodora Medici, DO   1 year ago [facility] Delsa Grana, PA-C       Future Appointments             In 4 months Gollan, Kathlene November, MD [place]             pantoprazole (PROTONIX) 20 MG tablet [Pharmacy Med Name: PANTOPRAZOLE SODIUM 20 MG DR TAB] 90 tablet 0    Sig: TAKE 1 TABLET BY MOUTH ONCE A DAY     Gastroenterology: Proton Pump Inhibitors Passed - 04/26/2022 10:44 AM      Passed - Valid encounter within last 12 months    Recent Outpatient Visits           4 months ago Dysfunction of both eustachian tubes   [place], DO   6 months ago Moderate episode of recurrent major depressive disorder [facility])   [place], DO   8 months ago Moderate episode of recurrent major depressive disorder [facility])   [facility] Teodora Medici, DO   10 months ago Moderate episode of recurrent major  depressive disorder [facility])   [facility] Teodora Medici, DO   1 year ago [facility] Delsa Grana, PA-C       Future Appointments             In 4 months Gollan, Kathlene November, MD [place]

## 2022-04-27 DIAGNOSIS — N184 Chronic kidney disease, stage 4 (severe): Secondary | ICD-10-CM | POA: Diagnosis not present

## 2022-04-27 DIAGNOSIS — I110 Hypertensive heart disease with heart failure: Secondary | ICD-10-CM | POA: Diagnosis not present

## 2022-04-27 DIAGNOSIS — E1122 Type 2 diabetes mellitus with diabetic chronic kidney disease: Secondary | ICD-10-CM | POA: Diagnosis not present

## 2022-04-27 DIAGNOSIS — Z48815 Encounter for surgical aftercare following surgery on the digestive system: Secondary | ICD-10-CM | POA: Diagnosis not present

## 2022-04-27 DIAGNOSIS — I5030 Unspecified diastolic (congestive) heart failure: Secondary | ICD-10-CM | POA: Diagnosis not present

## 2022-04-27 DIAGNOSIS — M1030 Gout due to renal impairment, unspecified site: Secondary | ICD-10-CM | POA: Diagnosis not present

## 2022-04-28 ENCOUNTER — Other Ambulatory Visit: Payer: Self-pay

## 2022-04-28 ENCOUNTER — Inpatient Hospital Stay
Admission: EM | Admit: 2022-04-28 | Discharge: 2022-04-30 | DRG: 698 | Disposition: A | Discharge status: Home Health | Payer: Medicare Other | Attending: Internal Medicine | Admitting: Internal Medicine

## 2022-04-28 DIAGNOSIS — Z8673 Personal history of transient ischemic attack (TIA), and cerebral infarction without residual deficits: Secondary | ICD-10-CM | POA: Diagnosis not present

## 2022-04-28 DIAGNOSIS — I959 Hypotension, unspecified: Secondary | ICD-10-CM | POA: Diagnosis not present

## 2022-04-28 DIAGNOSIS — Z881 Allergy status to other antibiotic agents status: Secondary | ICD-10-CM

## 2022-04-28 DIAGNOSIS — N3000 Acute cystitis without hematuria: Secondary | ICD-10-CM

## 2022-04-28 DIAGNOSIS — Z888 Allergy status to other drugs, medicaments and biological substances status: Secondary | ICD-10-CM

## 2022-04-28 DIAGNOSIS — F79 Unspecified intellectual disabilities: Secondary | ICD-10-CM | POA: Diagnosis not present

## 2022-04-28 DIAGNOSIS — D7589 Other specified diseases of blood and blood-forming organs: Secondary | ICD-10-CM | POA: Diagnosis present

## 2022-04-28 DIAGNOSIS — G40909 Epilepsy, unspecified, not intractable, without status epilepticus: Secondary | ICD-10-CM | POA: Diagnosis present

## 2022-04-28 DIAGNOSIS — F32A Depression, unspecified: Secondary | ICD-10-CM | POA: Diagnosis not present

## 2022-04-28 DIAGNOSIS — N179 Acute kidney failure, unspecified: Principal | ICD-10-CM | POA: Diagnosis present

## 2022-04-28 DIAGNOSIS — Z6835 Body mass index (BMI) 35.0-35.9, adult: Secondary | ICD-10-CM | POA: Diagnosis not present

## 2022-04-28 DIAGNOSIS — Z8711 Personal history of peptic ulcer disease: Secondary | ICD-10-CM

## 2022-04-28 DIAGNOSIS — E785 Hyperlipidemia, unspecified: Secondary | ICD-10-CM | POA: Diagnosis present

## 2022-04-28 DIAGNOSIS — Z823 Family history of stroke: Secondary | ICD-10-CM

## 2022-04-28 DIAGNOSIS — Z87440 Personal history of urinary (tract) infections: Secondary | ICD-10-CM

## 2022-04-28 DIAGNOSIS — Z9359 Other cystostomy status: Secondary | ICD-10-CM

## 2022-04-28 DIAGNOSIS — Y846 Urinary catheterization as the cause of abnormal reaction of the patient, or of later complication, without mention of misadventure at the time of the procedure: Secondary | ICD-10-CM | POA: Diagnosis not present

## 2022-04-28 DIAGNOSIS — Z801 Family history of malignant neoplasm of trachea, bronchus and lung: Secondary | ICD-10-CM

## 2022-04-28 DIAGNOSIS — R58 Hemorrhage, not elsewhere classified: Secondary | ICD-10-CM | POA: Diagnosis not present

## 2022-04-28 DIAGNOSIS — E1151 Type 2 diabetes mellitus with diabetic peripheral angiopathy without gangrene: Secondary | ICD-10-CM | POA: Diagnosis present

## 2022-04-28 DIAGNOSIS — K219 Gastro-esophageal reflux disease without esophagitis: Secondary | ICD-10-CM | POA: Diagnosis not present

## 2022-04-28 DIAGNOSIS — I5032 Chronic diastolic (congestive) heart failure: Secondary | ICD-10-CM | POA: Diagnosis present

## 2022-04-28 DIAGNOSIS — Z880 Allergy status to penicillin: Secondary | ICD-10-CM

## 2022-04-28 DIAGNOSIS — Z87891 Personal history of nicotine dependence: Secondary | ICD-10-CM | POA: Diagnosis not present

## 2022-04-28 DIAGNOSIS — H9190 Unspecified hearing loss, unspecified ear: Secondary | ICD-10-CM | POA: Diagnosis present

## 2022-04-28 DIAGNOSIS — N170 Acute kidney failure with tubular necrosis: Secondary | ICD-10-CM | POA: Diagnosis not present

## 2022-04-28 DIAGNOSIS — R109 Unspecified abdominal pain: Secondary | ICD-10-CM | POA: Diagnosis not present

## 2022-04-28 DIAGNOSIS — N136 Pyonephrosis: Secondary | ICD-10-CM | POA: Diagnosis present

## 2022-04-28 DIAGNOSIS — Y92009 Unspecified place in unspecified non-institutional (private) residence as the place of occurrence of the external cause: Secondary | ICD-10-CM

## 2022-04-28 DIAGNOSIS — I2510 Atherosclerotic heart disease of native coronary artery without angina pectoris: Secondary | ICD-10-CM | POA: Diagnosis present

## 2022-04-28 DIAGNOSIS — E860 Dehydration: Secondary | ICD-10-CM | POA: Diagnosis present

## 2022-04-28 DIAGNOSIS — Z961 Presence of intraocular lens: Secondary | ICD-10-CM | POA: Diagnosis present

## 2022-04-28 DIAGNOSIS — N390 Urinary tract infection, site not specified: Secondary | ICD-10-CM

## 2022-04-28 DIAGNOSIS — I10 Essential (primary) hypertension: Secondary | ICD-10-CM | POA: Diagnosis present

## 2022-04-28 DIAGNOSIS — N184 Chronic kidney disease, stage 4 (severe): Secondary | ICD-10-CM | POA: Diagnosis not present

## 2022-04-28 DIAGNOSIS — T368X5A Adverse effect of other systemic antibiotics, initial encounter: Secondary | ICD-10-CM | POA: Diagnosis present

## 2022-04-28 DIAGNOSIS — Z800 Family history of malignant neoplasm of digestive organs: Secondary | ICD-10-CM

## 2022-04-28 DIAGNOSIS — Z8249 Family history of ischemic heart disease and other diseases of the circulatory system: Secondary | ICD-10-CM

## 2022-04-28 DIAGNOSIS — E1122 Type 2 diabetes mellitus with diabetic chronic kidney disease: Secondary | ICD-10-CM | POA: Diagnosis present

## 2022-04-28 DIAGNOSIS — Z91040 Latex allergy status: Secondary | ICD-10-CM

## 2022-04-28 DIAGNOSIS — M8580 Other specified disorders of bone density and structure, unspecified site: Secondary | ICD-10-CM | POA: Diagnosis present

## 2022-04-28 DIAGNOSIS — Z833 Family history of diabetes mellitus: Secondary | ICD-10-CM

## 2022-04-28 DIAGNOSIS — R000 Tachycardia, unspecified: Secondary | ICD-10-CM | POA: Diagnosis not present

## 2022-04-28 DIAGNOSIS — T83518A Infection and inflammatory reaction due to other urinary catheter, initial encounter: Secondary | ICD-10-CM | POA: Diagnosis not present

## 2022-04-28 DIAGNOSIS — D509 Iron deficiency anemia, unspecified: Secondary | ICD-10-CM | POA: Diagnosis not present

## 2022-04-28 DIAGNOSIS — I130 Hypertensive heart and chronic kidney disease with heart failure and stage 1 through stage 4 chronic kidney disease, or unspecified chronic kidney disease: Secondary | ICD-10-CM | POA: Diagnosis not present

## 2022-04-28 DIAGNOSIS — Z820 Family history of epilepsy and other diseases of the nervous system: Secondary | ICD-10-CM

## 2022-04-28 DIAGNOSIS — Z8042 Family history of malignant neoplasm of prostate: Secondary | ICD-10-CM

## 2022-04-28 DIAGNOSIS — Z79899 Other long term (current) drug therapy: Secondary | ICD-10-CM | POA: Diagnosis not present

## 2022-04-28 DIAGNOSIS — N1330 Unspecified hydronephrosis: Secondary | ICD-10-CM | POA: Diagnosis not present

## 2022-04-28 DIAGNOSIS — G4733 Obstructive sleep apnea (adult) (pediatric): Secondary | ICD-10-CM | POA: Diagnosis present

## 2022-04-28 DIAGNOSIS — Z955 Presence of coronary angioplasty implant and graft: Secondary | ICD-10-CM

## 2022-04-28 DIAGNOSIS — I700 Atherosclerosis of aorta: Secondary | ICD-10-CM | POA: Diagnosis not present

## 2022-04-28 DIAGNOSIS — Z9842 Cataract extraction status, left eye: Secondary | ICD-10-CM

## 2022-04-28 LAB — CBC
HCT: 32.5 % — ABNORMAL LOW (ref 36.0–46.0)
Hemoglobin: 10.2 g/dL — ABNORMAL LOW (ref 12.0–15.0)
MCH: 32.1 pg (ref 26.0–34.0)
MCHC: 31.4 g/dL (ref 30.0–36.0)
MCV: 102.2 fL — ABNORMAL HIGH (ref 80.0–100.0)
Platelets: 231 10*3/uL (ref 150–400)
RBC: 3.18 MIL/uL — ABNORMAL LOW (ref 3.87–5.11)
RDW: 12.9 % (ref 11.5–15.5)
WBC: 5.9 10*3/uL (ref 4.0–10.5)
nRBC: 0 % (ref 0.0–0.2)

## 2022-04-28 LAB — BASIC METABOLIC PANEL
Anion gap: 12 (ref 5–15)
BUN: 32 mg/dL — ABNORMAL HIGH (ref 8–23)
CO2: 23 mmol/L (ref 22–32)
Calcium: 9.5 mg/dL (ref 8.9–10.3)
Chloride: 103 mmol/L (ref 98–111)
Creatinine, Ser: 3.79 mg/dL — ABNORMAL HIGH (ref 0.44–1.00)
GFR, Estimated: 12 mL/min — ABNORMAL LOW (ref >60)
Glucose, Bld: 128 mg/dL — ABNORMAL HIGH (ref 70–99)
Potassium: 3.7 mmol/L (ref 3.5–5.1)
Sodium: 138 mmol/L (ref 135–145)

## 2022-04-28 LAB — URINALYSIS, ROUTINE W REFLEX MICROSCOPIC
Bilirubin Urine: NEGATIVE
Glucose, UA: NEGATIVE mg/dL
Ketones, ur: NEGATIVE mg/dL
Nitrite: NEGATIVE
Protein, ur: 100 mg/dL — AB
RBC / HPF: >50 RBC/hpf — ABNORMAL HIGH (ref 0–5)
Specific Gravity, Urine: 1.011 (ref 1.005–1.030)
WBC, UA: >50 WBC/hpf — ABNORMAL HIGH (ref 0–5)
pH: 5 (ref 5.0–8.0)

## 2022-04-28 NOTE — ED Provider Notes (Incomplete)
[facility] Provider Note    Event Date/Time   First MD Initiated Contact with Patient 04/28/22 2354     (approximate)   History   Chief Complaint Vaginal Pain   HPI  Leslie Houston is a 71 y.o. female ***       Physical Exam   Triage Vital Signs: ED Triage Vitals  Enc Vitals Group     BP 04/28/22 2047 (!) 118/49     Pulse Rate 04/28/22 2047 64     Resp 04/28/22 2047 16     Temp 04/28/22 2047 98 F (36.7 C)     Temp Source 04/28/22 2047 Oral     SpO2 04/28/22 2047 98 %     Weight 04/28/22 2045 176 lb 12.9 oz (80.2 kg)     Height 04/28/22 2045 '4\' 11"'$  (1.499 m)     Head Circumference --      Peak Flow --      Pain Score 04/28/22 2045 8     Pain Loc --      Pain Edu? --      Excl. in [place]? --     Most recent vital signs: Vitals:   04/28/22 2047 04/28/22 2350  BP: (!) 118/49 117/71  Pulse: 64 63  Resp: 16 18  Temp: 98 F (36.7 C) 97.9 F (36.6 C)  SpO2: 98% 93%    Constitutional: Alert and oriented. Eyes: Conjunctivae are normal. Head: Atraumatic. Nose: No congestion/rhinnorhea. Mouth/Throat: Mucous membranes are moist.  Neck: *** Cardiovascular: Normal rate, regular rhythm. Grossly normal heart sounds.  2+ radial pulses bilaterally. Respiratory: Normal respiratory effort.  No retractions. Lungs CTAB. Gastrointestinal: Soft and nontender. No distention. Genitourinary: *** Musculoskeletal: No lower extremity tenderness nor edema.  Neurologic:  Normal speech and language. No gross focal neurologic deficits are appreciated.    ED Results / Procedures / Treatments   Labs (all labs ordered are listed, but only abnormal results are displayed) Labs Reviewed  URINALYSIS, ROUTINE W REFLEX MICROSCOPIC - Abnormal; Notable for the following components:      Result Value   Color, Urine YELLOW (*)    APPearance CLOUDY (*)    Hgb urine dipstick LARGE (*)    Protein, ur 100 (*)    Leukocytes,Ua LARGE (*)    RBC / HPF >50 (*)     WBC, UA >50 (*)    Bacteria, UA RARE (*)    All other components within normal limits  BASIC METABOLIC PANEL - Abnormal; Notable for the following components:   Glucose, Bld 128 (*)    BUN 32 (*)    Creatinine, Ser 3.79 (*)    GFR, Estimated 12 (*)    All other components within normal limits  CBC - Abnormal; Notable for the following components:   RBC 3.18 (*)    Hemoglobin 10.2 (*)    HCT 32.5 (*)    MCV 102.2 (*)    All other components within normal limits     EKG  ED ECG REPORT I, Blake Divine, the attending physician, personally viewed and interpreted this ECG.   Date: 04/28/2022  EKG Time: ***  Rate: ***  Rhythm: {ekg findings:315101}  Axis: ***  Intervals:{conduction defects:17367}  ST&T Change: ***  RADIOLOGY ***  PROCEDURES:  Critical Care performed: {CriticalCareYesNo:19197::"Yes, see critical care procedure note(s)","No"}  Procedures   MEDICATIONS ORDERED IN ED: Medications - No data to display   IMPRESSION / MDM / [place] / ED COURSE  I  reviewed the triage vital signs and the nursing notes.                              71 y.o. female ***   Patient's presentation is most consistent with {EM COPA:27473}  Differential diagnosis includes, but is not limited to, ***  ***  {**The patient is on the cardiac monitor to evaluate for evidence of arrhythmia and/or significant heart rate changes.**}      FINAL CLINICAL IMPRESSION(S) / ED DIAGNOSES   Final diagnoses:  None     Rx / DC Orders   ED Discharge Orders     None        Note:  This document was prepared using Dragon voice recognition software and may include unintentional dictation errors.

## 2022-04-28 NOTE — ED Triage Notes (Addendum)
Pt to ED from home via EMS. Pt is CAOX4 and answering questions appropriately. Pt lives with daughter who will be up later. Pt chief complaint is pain in her vaginal area. Pt had suprapubic catheter placed 03/10/22 here. Pt is unsure why they placed it. DC paperwork with patient. Pt denies any N/V/D or other symptoms.

## 2022-04-28 NOTE — ED Triage Notes (Signed)
FIRST NURSE NOTE:  Pt arrived [facility] from home with reports of suprapubic cath, c/o vag pain, pt is currently finishing atbx.

## 2022-04-29 ENCOUNTER — Emergency Department: Payer: Medicare Other

## 2022-04-29 DIAGNOSIS — N184 Chronic kidney disease, stage 4 (severe): Secondary | ICD-10-CM | POA: Diagnosis not present

## 2022-04-29 DIAGNOSIS — K219 Gastro-esophageal reflux disease without esophagitis: Secondary | ICD-10-CM | POA: Insufficient documentation

## 2022-04-29 DIAGNOSIS — N136 Pyonephrosis: Secondary | ICD-10-CM | POA: Diagnosis not present

## 2022-04-29 DIAGNOSIS — N390 Urinary tract infection, site not specified: Secondary | ICD-10-CM

## 2022-04-29 DIAGNOSIS — N1330 Unspecified hydronephrosis: Secondary | ICD-10-CM | POA: Diagnosis not present

## 2022-04-29 DIAGNOSIS — I10 Essential (primary) hypertension: Secondary | ICD-10-CM

## 2022-04-29 DIAGNOSIS — D7589 Other specified diseases of blood and blood-forming organs: Secondary | ICD-10-CM | POA: Diagnosis not present

## 2022-04-29 DIAGNOSIS — N179 Acute kidney failure, unspecified: Secondary | ICD-10-CM | POA: Diagnosis not present

## 2022-04-29 DIAGNOSIS — T368X5A Adverse effect of other systemic antibiotics, initial encounter: Secondary | ICD-10-CM | POA: Diagnosis not present

## 2022-04-29 DIAGNOSIS — E860 Dehydration: Secondary | ICD-10-CM | POA: Diagnosis not present

## 2022-04-29 DIAGNOSIS — I5032 Chronic diastolic (congestive) heart failure: Secondary | ICD-10-CM | POA: Diagnosis not present

## 2022-04-29 DIAGNOSIS — Z8249 Family history of ischemic heart disease and other diseases of the circulatory system: Secondary | ICD-10-CM | POA: Diagnosis not present

## 2022-04-29 DIAGNOSIS — N170 Acute kidney failure with tubular necrosis: Secondary | ICD-10-CM | POA: Diagnosis not present

## 2022-04-29 DIAGNOSIS — I2510 Atherosclerotic heart disease of native coronary artery without angina pectoris: Secondary | ICD-10-CM | POA: Diagnosis not present

## 2022-04-29 DIAGNOSIS — Z87891 Personal history of nicotine dependence: Secondary | ICD-10-CM | POA: Diagnosis not present

## 2022-04-29 DIAGNOSIS — E785 Hyperlipidemia, unspecified: Secondary | ICD-10-CM | POA: Diagnosis not present

## 2022-04-29 DIAGNOSIS — F79 Unspecified intellectual disabilities: Secondary | ICD-10-CM | POA: Diagnosis not present

## 2022-04-29 DIAGNOSIS — Z79899 Other long term (current) drug therapy: Secondary | ICD-10-CM | POA: Diagnosis not present

## 2022-04-29 DIAGNOSIS — I700 Atherosclerosis of aorta: Secondary | ICD-10-CM | POA: Diagnosis not present

## 2022-04-29 DIAGNOSIS — T83518A Infection and inflammatory reaction due to other urinary catheter, initial encounter: Secondary | ICD-10-CM | POA: Diagnosis not present

## 2022-04-29 DIAGNOSIS — G40909 Epilepsy, unspecified, not intractable, without status epilepticus: Secondary | ICD-10-CM | POA: Diagnosis not present

## 2022-04-29 DIAGNOSIS — Y92009 Unspecified place in unspecified non-institutional (private) residence as the place of occurrence of the external cause: Secondary | ICD-10-CM | POA: Diagnosis not present

## 2022-04-29 DIAGNOSIS — Y846 Urinary catheterization as the cause of abnormal reaction of the patient, or of later complication, without mention of misadventure at the time of the procedure: Secondary | ICD-10-CM | POA: Diagnosis not present

## 2022-04-29 DIAGNOSIS — F32A Depression, unspecified: Secondary | ICD-10-CM | POA: Diagnosis not present

## 2022-04-29 DIAGNOSIS — Z6835 Body mass index (BMI) 35.0-35.9, adult: Secondary | ICD-10-CM | POA: Diagnosis not present

## 2022-04-29 DIAGNOSIS — I130 Hypertensive heart and chronic kidney disease with heart failure and stage 1 through stage 4 chronic kidney disease, or unspecified chronic kidney disease: Secondary | ICD-10-CM | POA: Diagnosis not present

## 2022-04-29 DIAGNOSIS — E1122 Type 2 diabetes mellitus with diabetic chronic kidney disease: Secondary | ICD-10-CM | POA: Diagnosis not present

## 2022-04-29 DIAGNOSIS — R109 Unspecified abdominal pain: Secondary | ICD-10-CM | POA: Diagnosis not present

## 2022-04-29 DIAGNOSIS — Z8673 Personal history of transient ischemic attack (TIA), and cerebral infarction without residual deficits: Secondary | ICD-10-CM | POA: Diagnosis not present

## 2022-04-29 DIAGNOSIS — D509 Iron deficiency anemia, unspecified: Secondary | ICD-10-CM | POA: Diagnosis not present

## 2022-04-29 DIAGNOSIS — E1151 Type 2 diabetes mellitus with diabetic peripheral angiopathy without gangrene: Secondary | ICD-10-CM | POA: Diagnosis not present

## 2022-04-29 LAB — CBC
HCT: 30.7 % — ABNORMAL LOW (ref 36.0–46.0)
Hemoglobin: 9.4 g/dL — ABNORMAL LOW (ref 12.0–15.0)
MCH: 31.6 pg (ref 26.0–34.0)
MCHC: 30.6 g/dL (ref 30.0–36.0)
MCV: 103.4 fL — ABNORMAL HIGH (ref 80.0–100.0)
Platelets: 183 10*3/uL (ref 150–400)
RBC: 2.97 MIL/uL — ABNORMAL LOW (ref 3.87–5.11)
RDW: 13.2 % (ref 11.5–15.5)
WBC: 4.4 10*3/uL (ref 4.0–10.5)
nRBC: 0 % (ref 0.0–0.2)

## 2022-04-29 LAB — BASIC METABOLIC PANEL
Anion gap: 8 (ref 5–15)
BUN: 31 mg/dL — ABNORMAL HIGH (ref 8–23)
CO2: 22 mmol/L (ref 22–32)
Calcium: 8.8 mg/dL — ABNORMAL LOW (ref 8.9–10.3)
Chloride: 109 mmol/L (ref 98–111)
Creatinine, Ser: 3.5 mg/dL — ABNORMAL HIGH (ref 0.44–1.00)
GFR, Estimated: 13 mL/min — ABNORMAL LOW (ref >60)
Glucose, Bld: 97 mg/dL (ref 70–99)
Potassium: 4.5 mmol/L (ref 3.5–5.1)
Sodium: 139 mmol/L (ref 135–145)

## 2022-04-29 LAB — HEPATIC FUNCTION PANEL
ALT: 14 U/L (ref 0–44)
AST: 20 U/L (ref 15–41)
Albumin: 3.4 g/dL — ABNORMAL LOW (ref 3.5–5.0)
Alkaline Phosphatase: 82 U/L (ref 38–126)
Bilirubin, Direct: 0.2 mg/dL (ref 0.0–0.2)
Indirect Bilirubin: 0.3 mg/dL (ref 0.3–0.9)
Total Bilirubin: 0.5 mg/dL (ref 0.3–1.2)
Total Protein: 7.5 g/dL (ref 6.5–8.1)

## 2022-04-29 LAB — WET PREP, GENITAL
Clue Cells Wet Prep HPF POC: NONE SEEN
Sperm: NONE SEEN
Trich, Wet Prep: NONE SEEN
WBC, Wet Prep HPF POC: <10 (ref <10)
Yeast Wet Prep HPF POC: NONE SEEN

## 2022-04-29 LAB — LIPASE, BLOOD: Lipase: 42 U/L (ref 11–51)

## 2022-04-29 MED ORDER — ADULT MULTIVITAMIN W/MINERALS CH
1.0000 | ORAL_TABLET | Freq: Every day | ORAL | Status: DC
  Administered 2022-04-29 – 2022-04-30 (×2): 1 via ORAL
  Filled 2022-04-29 (×2): qty 1

## 2022-04-29 MED ORDER — HYDRALAZINE HCL 10 MG PO TABS
10.0000 mg | ORAL_TABLET | Freq: Every day | ORAL | Status: DC
  Administered 2022-04-30: 10 mg via ORAL
  Filled 2022-04-29 (×3): qty 1

## 2022-04-29 MED ORDER — POLYETHYLENE GLYCOL 3350 17 G PO PACK
17.0000 g | PACK | Freq: Every day | ORAL | Status: DC
  Administered 2022-04-29 – 2022-04-30 (×2): 17 g via ORAL
  Filled 2022-04-29 (×2): qty 1

## 2022-04-29 MED ORDER — ENOXAPARIN SODIUM 40 MG/0.4ML IJ SOSY: 40.0000 mg | PREFILLED_SYRINGE | INTRAMUSCULAR | Status: DC

## 2022-04-29 MED ORDER — LORATADINE 10 MG PO TABS: 10.0000 mg | ORAL_TABLET | Freq: Every day | ORAL | Status: DC | PRN

## 2022-04-29 MED ORDER — ONDANSETRON HCL 4 MG PO TABS: 4.0000 mg | ORAL_TABLET | Freq: Four times a day (QID) | ORAL | Status: DC | PRN

## 2022-04-29 MED ORDER — SODIUM CHLORIDE 0.9 % IV SOLN
1.0000 g | Freq: Once | INTRAVENOUS | Status: AC
  Administered 2022-04-29: 1 g via INTRAVENOUS
  Filled 2022-04-29: qty 10

## 2022-04-29 MED ORDER — FLUTICASONE PROPIONATE 50 MCG/ACT NA SUSP: 2.0000 | Freq: Every day | NASAL | Status: DC | PRN

## 2022-04-29 MED ORDER — ACETAMINOPHEN 650 MG RE SUPP: 650.0000 mg | Freq: Four times a day (QID) | RECTAL | Status: DC | PRN

## 2022-04-29 MED ORDER — CITALOPRAM HYDROBROMIDE 20 MG PO TABS
20.0000 mg | ORAL_TABLET | Freq: Every day | ORAL | Status: DC
  Administered 2022-04-29 – 2022-04-30 (×2): 20 mg via ORAL
  Filled 2022-04-29 (×2): qty 1

## 2022-04-29 MED ORDER — MORPHINE SULFATE (PF) 4 MG/ML IV SOLN
4.0000 mg | Freq: Once | INTRAVENOUS | Status: AC
  Administered 2022-04-29: 4 mg via INTRAVENOUS
  Filled 2022-04-29: qty 1

## 2022-04-29 MED ORDER — TAMSULOSIN HCL 0.4 MG PO CAPS
0.4000 mg | ORAL_CAPSULE | Freq: Every day | ORAL | Status: DC
  Administered 2022-04-29 – 2022-04-30 (×2): 0.4 mg via ORAL
  Filled 2022-04-29 (×2): qty 1

## 2022-04-29 MED ORDER — ENSURE ENLIVE PO LIQD
237.0000 mL | Freq: Two times a day (BID) | ORAL | Status: DC
  Administered 2022-04-29 – 2022-04-30 (×3): 237 mL via ORAL

## 2022-04-29 MED ORDER — TRAZODONE HCL 50 MG PO TABS: 25.0000 mg | ORAL_TABLET | Freq: Every evening | ORAL | Status: DC | PRN

## 2022-04-29 MED ORDER — LEVETIRACETAM 500 MG PO TABS
1000.0000 mg | ORAL_TABLET | Freq: Two times a day (BID) | ORAL | Status: DC
  Administered 2022-04-29 – 2022-04-30 (×3): 1000 mg via ORAL
  Filled 2022-04-29 (×3): qty 2

## 2022-04-29 MED ORDER — FERROUS SULFATE 325 (65 FE) MG PO TABS
325.0000 mg | ORAL_TABLET | Freq: Every day | ORAL | Status: DC
  Administered 2022-04-29 – 2022-04-30 (×2): 325 mg via ORAL
  Filled 2022-04-29 (×2): qty 1

## 2022-04-29 MED ORDER — PANTOPRAZOLE SODIUM 20 MG PO TBEC
20.0000 mg | DELAYED_RELEASE_TABLET | Freq: Every day | ORAL | Status: DC
  Administered 2022-04-29 – 2022-04-30 (×2): 20 mg via ORAL
  Filled 2022-04-29 (×3): qty 1

## 2022-04-29 MED ORDER — MAGNESIUM HYDROXIDE 400 MG/5ML PO SUSP: 30.0000 mL | Freq: Every day | ORAL | Status: DC | PRN

## 2022-04-29 MED ORDER — ONDANSETRON HCL 4 MG/2ML IJ SOLN: 4.0000 mg | Freq: Four times a day (QID) | INTRAMUSCULAR | Status: DC | PRN

## 2022-04-29 MED ORDER — SODIUM CHLORIDE 0.9 % IV SOLN: INTRAVENOUS | Status: DC

## 2022-04-29 MED ORDER — HEPARIN SODIUM (PORCINE) 5000 UNIT/ML IJ SOLN
5000.0000 [IU] | Freq: Three times a day (TID) | INTRAMUSCULAR | Status: DC
  Administered 2022-04-29 – 2022-04-30 (×4): 5000 [IU] via SUBCUTANEOUS
  Filled 2022-04-29 (×4): qty 1

## 2022-04-29 MED ORDER — LACTATED RINGERS IV BOLUS
1000.0000 mL | Freq: Once | INTRAVENOUS | Status: AC
  Administered 2022-04-29: 1000 mL via INTRAVENOUS

## 2022-04-29 MED ORDER — VITAMIN D 25 MCG (1000 UNIT) PO TABS
1000.0000 [IU] | ORAL_TABLET | Freq: Every day | ORAL | Status: DC
  Administered 2022-04-29 – 2022-04-30 (×2): 1000 [IU] via ORAL
  Filled 2022-04-29 (×2): qty 1

## 2022-04-29 MED ORDER — ACETAMINOPHEN 325 MG PO TABS
650.0000 mg | ORAL_TABLET | Freq: Four times a day (QID) | ORAL | Status: DC | PRN
  Administered 2022-04-29 – 2022-04-30 (×2): 650 mg via ORAL
  Filled 2022-04-29 (×2): qty 2

## 2022-04-29 MED ORDER — SODIUM CHLORIDE 0.9 % IV SOLN
1.0000 g | INTRAVENOUS | Status: DC
  Administered 2022-04-29: 1 g via INTRAVENOUS
  Filled 2022-04-29: qty 10

## 2022-04-29 NOTE — H&P (Signed)
[place]   PATIENT NAME: Leslie Houston    MR#:  [id number]  DATE OF BIRTH:  [birth year]  DATE OF ADMISSION:  04/28/2022  PRIMARY CARE PHYSICIAN: Teodora Medici, DO   Patient is coming from: Home  REQUESTING/REFERRING PHYSICIAN: Blake Divine, MD  CHIEF COMPLAINT:   Chief Complaint  Patient presents with   Vaginal Pain    HISTORY OF PRESENT ILLNESS:  Ezri Fanguy is a 71 y.o. female with medical history significant for diastolic CHF, coronary artery disease, stage IV chronic kidney disease, type 2 diabetes mellitus, hypertension, dyslipidemia, OSA, PUD and seizure disorder, who presented to the emergency room with acute onset of pelvic and suprapubic pain over the last 24 hours.  Her daughter was concerned about her suprapubic catheter that was placed last month that is not functioning correctly.  She noted dark-colored urine with sediment.  No fever or chills.  No flank pain or nausea or vomiting.  There was a small amount of bleeding from her suprapubic catheter per her daughter as well as mild leakage around the site.  Her pain extends to her vaginal area but she had no vaginal bleeding or discharge.  She was recently diagnosed with UTI and given p.o. Bactrim by urology.  Urine culture grew E. coli and Klebsiella that was sensitive to Bactrim DS.  ED Course: When she came to the ER, BP was 118/49 with otherwise normal vital signs.  Labs revealed a BUN of 32 with a creatinine of 3.79 compared to 55/2.38 on 04/05/2022.  Albumin was 3.4 with otherwise unremarkable CMP.  CBC showed hemoglobin of 10.2 and hematocrit 32.5 with macrocytosis.  UA is still positive for UTI.  The patient was given 1 L bolus of IV normal saline and 4 mg of IV morphine sulfate as well as 1 g of IV Rocephin.  She will be admitted to a medical bed for further evaluation and management. PAST MEDICAL HISTORY:   Past Medical History:  Diagnosis Date   (HFpEF) heart failure with preserved  ejection fraction ([place])    a. Pt reports in 2000 she had CHF, details unclear, outside hospital; b. 06/2019 Echo: EF 50-55%, no rwma, Gr2 DD,  mildly dil LA, mild MR.   Acute left PCA stroke (HCC) 06/22/2014   Anxiety    a.) on BZO (alprazolam) PRN   CAD (coronary artery disease)    a. Pt reports in 2000 she had a stent to a coronary artery, details unclear, outside hospital.   Carotid stenosis    a. CT angio head 07/2014 - 50-60% stenoses of prox ICA bilaterally.   CKD (chronic kidney disease), stage IV ([place])    Depression    Diabetes mellitus ([place])    Denies.   Edentulous    No upper teeth   Family hx of colon cancer 06/13/2018   Father diagnosed in his 13's   Gout    History of cataract extraction, left 03/21/2021   HOH (hard of hearing)    usually needs to read lips   Hyperlipidemia    Hypertension    Intellectual disability    a.) born premature, limited formal education, has never driven   Myalgia due to statin    Orthostatic hypotension    OSA (obstructive sleep apnea)    a.) not on nocturnal PAP therapy; old/nonfuntioning DME - referred for repeat PSG 01/2022 by neurology   Osteopenia 01/24/2018   DEXA Sept 2019   PONV (postoperative nausea and vomiting)  PTSD (post-traumatic stress disorder)        PUD (peptic ulcer disease)    PVC's (premature ventricular contractions)    Right sided weakness 05/2014   a.) s/p CVA   Seizures (HCC)    Sinus bradycardia    Syncope    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;  Service: Ophthalmology;  Laterality: Left;  Latex   CESAREAN SECTION     CORONARY ANGIOPLASTY WITH STENT PLACEMENT     CYSTOSCOPY N/A  03/12/2022   Procedure: CYSTOSCOPY;  Surgeon: [name], MD;  Location: [facility];  Service: Urology;  Laterality: N/A;   DENTAL SURGERY     INSERTION OF SUPRAPUBIC CATHETER N/A 03/12/2022   Procedure: INSERTION OF SUPRAPUBIC CATHETER;  Surgeon: [name], MD;  Location: [facility];  Service: Urology;  Laterality: N/A;   LAPAROTOMY N/A 03/22/2022   Procedure: EXPLORATORY LAPAROTOMY; LYSIS OF ADHESIONS;  Surgeon: Benjamine Sprague, DO;  Location: [facility];  Service: General;  Laterality: N/A;   RECTAL EXAM UNDER ANESTHESIA N/A 03/12/2022   Procedure: PELVIC EXAM UNDER ANESTHESIA;  Surgeon: [name], MD;  Location: [facility];  Service: Urology;  Laterality: N/A;    SOCIAL HISTORY:   Social History   Tobacco Use   Smoking status: Former    Types: Cigarettes    Quit date: 03/30/2014    Years since quitting: 8.0    Passive exposure: Past   Smokeless tobacco: Never  Substance Use Topics   Alcohol use: No    FAMILY HISTORY:   Family History  Problem Relation Age of Onset   Stroke Mother    Heart attack Mother    Lung cancer Mother    Stroke Father    Prostate cancer Father    Colon cancer Father    Diabetes Mellitus II Daughter    Multiple sclerosis Daughter    Kidney disease Son    Heart failure Son    Cancer Paternal Grandfather     DRUG ALLERGIES:   Allergies  Allergen Reactions   Latex Rash   Penicillin G Itching   Atorvastatin Other (See Comments)    Myalgias    Other Hives    Adhesive tape   Amlodipine Swelling    Leg swelling   Doxycycline     Blood in urine    REVIEW OF SYSTEMS:   ROS As per history of present illness. All pertinent systems were reviewed above. Constitutional, HEENT, cardiovascular, respiratory, GI, GU, musculoskeletal, neuro, psychiatric, endocrine, integumentary and hematologic systems were reviewed and are otherwise negative/unremarkable except for positive findings mentioned above in the HPI.   MEDICATIONS AT HOME:    Prior to Admission medications   Medication Sig Start Date End Date Taking? Authorizing Provider  Cholecalciferol (VITAMIN D3) 25 MCG (1000 UT) CAPS Take 1 capsule by mouth daily.   Yes [provider]  citalopram (CELEXA) 20 MG tablet TAKE 1 TABLET BY MOUTH ONCE A DAY 04/26/22  Yes Teodora Medici, DO  feeding supplement (ENSURE ENLIVE / ENSURE PLUS) LIQD Take 237 mLs by mouth 2 (two) times daily between meals. 04/06/22  Yes Annita Brod, MD  ferrous sulfate 325 (65 FE) MG tablet Take 325 mg  by mouth daily with breakfast.   Yes [provider]  fluticasone (FLONASE) 50 MCG/ACT nasal spray Place 2 sprays into both nostrils daily as needed for allergies (allergies).    Yes [provider]  hydrALAZINE (APRESOLINE) 10 MG tablet Take 10 mg by mouth daily. 04/26/22  Yes [provider]  levETIRAcetam (KEPPRA) 1000 MG tablet Take 1 tablet (1,000 mg total) by mouth 2 (two) times daily. 04/05/22  Yes Annita Brod, MD  loratadine (CLARITIN) 10 MG tablet TAKE 1 TABLET BY MOUTH ONCE A DAY AS NEEDED FOR ALLERGIES 04/26/21  Yes Mecum, Erin E, PA-C  Multiple Vitamin (MULTIVITAMIN WITH MINERALS) TABS tablet Take 1 tablet by mouth daily. 04/06/22  Yes Annita Brod, MD  ondansetron (ZOFRAN-ODT) 4 MG disintegrating tablet Take 1 tablet (4 mg total) by mouth every 8 (eight) hours as needed. 04/24/22  Yes Rada Hay, MD  pantoprazole (PROTONIX) 20 MG tablet TAKE 1 TABLET BY MOUTH ONCE A DAY 04/26/22  Yes Teodora Medici, DO  polyethylene glycol (MIRALAX / GLYCOLAX) 17 g packet Take 17 g by mouth daily. 04/06/22  Yes Annita Brod, MD  sulfamethoxazole-trimethoprim (BACTRIM DS) 800-160 MG tablet Take 1 tablet by mouth 2 (two) times daily. 04/19/22  Yes Vaillancourt, Aldona Bar, PA-C  tamsulosin (FLOMAX) 0.4 MG CAPS capsule Take 1 capsule (0.4 mg total) by mouth daily. Take one in the morning and one at night. 04/05/22  Yes Annita Brod, MD   torsemide (DEMADEX) 20 MG tablet TAKE 1 TABLET BY MOUTH ONCE DAILY 10/26/21  Yes Gollan, Kathlene November, MD      VITAL SIGNS:  Blood pressure (!) 108/50, pulse (!) 54, temperature 98.3 F (36.8 C), resp. rate 18, height '4\' 11"'$  (1.499 m), weight 80.2 kg, SpO2 97 %.  PHYSICAL EXAMINATION:  Physical Exam  GENERAL:  71 y.o.-year-old female patient lying in the bed with no acute distress.  EYES: Pupils equal, round, reactive to light and accommodation. No scleral icterus. Extraocular muscles intact.  HEENT: Head atraumatic, normocephalic. Oropharynx and nasopharynx clear.  NECK:  Supple, no jugular venous distention. No thyroid enlargement, no tenderness.  LUNGS: Normal breath sounds bilaterally, no wheezing, rales,rhonchi or crepitation. No use of accessory muscles of respiration.  CARDIOVASCULAR: Regular rate and rhythm, S1, S2 normal. No murmurs, rubs, or gallops.  ABDOMEN: Soft, nondistended, nontender. Bowel sounds present. No organomegaly or mass.  Intact suprapubic catheter. EXTREMITIES: No pedal edema, cyanosis, or clubbing.  NEUROLOGIC: Cranial nerves II through XII are intact. Muscle strength 5/5 in all extremities. Sensation intact. Gait not checked.  PSYCHIATRIC: The patient is alert and oriented x 3.  Normal affect and good eye contact. SKIN: No obvious rash, lesion, or ulcer.   LABORATORY PANEL:   CBC Recent Labs  Lab 04/29/22 0416  WBC 4.4  HGB 9.4*  HCT 30.7*  PLT 183   ------------------------------------------------------------------------------------------------------------------  Chemistries  Recent Labs  Lab 04/28/22 2049 04/29/22 0416  NA 138 139  K 3.7 4.5  CL 103 109  CO2 23 22  GLUCOSE 128* 97  BUN 32* 31*  CREATININE 3.79* 3.50*  CALCIUM 9.5 8.8*  AST 20  --   ALT 14  --   ALKPHOS 82  --   BILITOT 0.5  --    ------------------------------------------------------------------------------------------------------------------  Cardiac Enzymes No  results for input(s): "TROPONINI" in the last 168 hours. ------------------------------------------------------------------------------------------------------------------  RADIOLOGY:  CT ABDOMEN PELVIS WO CONTRAST  Result Date: 04/29/2022 CLINICAL DATA:  Acute abdominal pain. Most recent surgery 03/22/2022 with laparotomy  with LOA, before this was insertion of suprapubic bladder catheter 03/12/2022. Currently taking antibiotics. EXAM: CT ABDOMEN AND PELVIS WITHOUT CONTRAST TECHNIQUE: Multidetector CT imaging of the abdomen and pelvis was performed following the standard protocol without IV contrast. RADIATION DOSE REDUCTION: This exam was performed according to the departmental dose-optimization program which includes automated exposure control, adjustment of the mA and/or kV according to patient size and/or use of iterative reconstruction technique. COMPARISON:  Recent CT without contrast 04/24/2022, CT with contrast 03/28/2022 FINDINGS: Lower chest: There is bronchial thickening in the lower lobes and mosaic attenuation consistent with small airways disease with air trapping. No lung base pneumonia is seen. There is scattered linear scarring or atelectasis. The cardiac size is normal. Small hiatal hernia. Hepatobiliary: The liver is unremarkable without contrast. The gallbladder and bile ducts are unremarkable. Pancreas: No abnormality. Spleen: No abnormality.  No splenomegaly. Adrenals/Urinary Tract: There is no adrenal mass. Bilateral renal scarring and cortical volume loss are again shown with mild chronic hydronephrosis and urothelial thickening along the renal pelves and proximal ureters. Thickening of the bladder is again noted, appears similar to the prior studies, with suprapubic bladder catheter unchanged in positioning and appearance including the extra-abdominal segment. No new abnormality is seen such as air in the renal collecting systems, renal parenchyma or the bladder wall, or interval  development of increased perinephric edema or perinephric fluid. Stomach/Bowel: Small hiatal hernia. No dilatation or wall thickening including of the appendix. Vascular/Lymphatic: Aortic atherosclerosis. No enlarged abdominal or pelvic lymph nodes. Reproductive: Uterus and bilateral adnexa are unremarkable. Other: No free fluid, free air or free hemorrhage. There is no incarcerated hernia. Musculoskeletal: Osteopenia and degenerative change thoracic and lumbar spine, mild hip DJD. There is pelvic enthesopathy. Severe acquired foraminal stenosis L4-5, L5-S1 right greater than left. IMPRESSION: 1. Suprapubic bladder catheter with no acute complication identified. 2. Chronic renal scarring and volume loss with mild hydronephrosis, urothelial and bladder thickening unchanged. 3. Small hiatal hernia. 4. Aortic atherosclerosis. 5. Bronchitis and air trapping in the lung bases. 6. Osteopenia and degenerative change with severe acquired foraminal stenosis L4-5 and L5-S1 right greater than left. Aortic Atherosclerosis (ICD10-I70.0). Electronically Signed   By: Telford Nab M.D.   On: 04/29/2022 02:15      IMPRESSION AND PLAN:  Assessment and Plan: * AKI (acute kidney injury) ([place]) - The patient admitted to a medical bed. - Continue hydration with IV normal saline. - Will follow BMP. - We will avoid nephrotoxins. - Nephrology consult will be obtained. - I notified Dr. Holley Raring about the patient.  Recurrent UTI - We will place on IV Rocephin as it showed adequate sensitivity to previous urine culture on 12/15 and follow urine culture and sensitivity.  GERD without esophagitis - We will continue Flomax.  Seizure disorder ([place]) - We will continue her Keppra.  Essential hypertension - We will continue her antihypertensive therapy.    DVT prophylaxis: Lovenox.  Advanced Care Planning:  Code Status: full code.  Family Communication:  The plan of care was discussed in details with the patient (and  family). I answered all questions. The patient agreed to proceed with the above mentioned plan. Further management will depend upon hospital course. Disposition Plan: Back to previous home environment Consults called: Nephrology All the records are reviewed and case discussed with ED provider.  Status is: Inpatient   At the time of the admission, it appears that the appropriate admission status for this patient is inpatient.  This is judged to be reasonable  and necessary in order to provide the required intensity of service to ensure the patient's safety given the presenting symptoms, physical exam findings and initial radiographic and laboratory data in the context of comorbid conditions.  The patient requires inpatient status due to high intensity of service, high risk of further deterioration and high frequency of surveillance required.  I certify that at the time of admission, it is my clinical judgment that the patient will require inpatient hospital care extending more than 2 midnights.                            Dispo: The patient is from: Home              Anticipated d/c is to: Home              Patient currently is not medically stable to d/c.              Difficult to place patient: No  Christel Mormon M.D on 04/29/2022 at 5:01 AM  [facility]   From 7 PM-7 AM, contact night-coverage [email or web address]  CC: Primary care physician; Teodora Medici, DO

## 2022-04-29 NOTE — Assessment & Plan Note (Signed)
-   We will continue Flomax 

## 2022-04-29 NOTE — Assessment & Plan Note (Signed)
-   We will place on IV Rocephin as it showed adequate sensitivity to previous urine culture on 12/15 and follow urine culture and sensitivity.

## 2022-04-29 NOTE — Assessment & Plan Note (Signed)
-   We will continue her antihypertensive therapy 

## 2022-04-29 NOTE — Assessment & Plan Note (Signed)
-   The patient admitted to a medical bed. - Continue hydration with IV normal saline. - Will follow BMP. - We will avoid nephrotoxins. - Nephrology consult will be obtained. - I notified Dr. Holley Raring about the patient.

## 2022-04-29 NOTE — Progress Notes (Addendum)
PROGRESS NOTE    Leslie Houston  FAO:[id number] DOB: [birth year] DOA: 04/28/2022 PCP: Teodora Medici, DO    Brief Narrative:   Leslie Houston is a 72 y.o. female with past medical history significant for chronic diastolic congestive heart failure, CAD, CKD stage IV, type 2 diabetes mellitus, essential hypertension, dyslipidemia, PVD, seizure disorder who presented to [facility] ED on 12/30 via EMS from home complaining about lower abdominal/suprapubic pain.  Patient recently prescribed antibiotics for urinary tract infection with Bactrim with urine culture positive for E. coli and Klebsiella with appropriate sensitivity.  Also with recent suprapubic catheter placement by urology in November 2023.  Daughter concerned regarding her suprapubic catheter that was placed last month.  Noted dark-colored urine with sediment but denied any fever/chills, no flank pain, no nausea/vomiting.  Pain also extends to her vaginal area but no reports of vaginal bleeding or discharge.  In the ED, temperature 98.0 F, HR 64, RR 16, BP 118/49, SpO2 98% on room air.  WBC 5.9, hemoglobin 10.2, platelets 231.  Sodium 138, potassium 3.7, chloride 103, CO2 23, glucose 128, BUN 32, creatinine 3.79.  AST 20, ALT 14, total bilirubin 0.5.  Lipase 20.  Urinalysis with large leukocytes, rare bacteria, greater than 50 WBCs.  CT abdomen/pelvis without contrast with suprapubic catheter in place with no acute complication identified, chronic renal scarring and volume loss with mild hydronephrosis and urothelial and bladder wall thickening unchanged, small hiatal hernia.  Patient underwent vaginal exam by EDP with wet prep that was negative for yeast/trichomonas/clue cells.  Urine culture obtained.  Patient was given 1 L NS bolus, 4 mg IV morphine, 1 g IV Rocephin.  TRH consulted for admission for further evaluation management of UTI and acute on chronic renal failure.  Assessment & Plan:   Urinary tract infection Patient presenting  with lower abdominal/suprapubic pain in the setting of recent suprapubic catheter placement by urology November 2023.  Recent diagnosis of UTI with E. coli/Klebsiella and has been taking Bactrim outpatient.  Patient is afebrile without leukocytosis.  Urinalysis with large leukocytes, negative nitrite, rare bacteria and greater than 50 WBCs.  CT abdomen/pelvis without contrast shows improvement of hydronephrosis, now minimal with no other acute findings. -- Urine culture: Pending -- Ceftriaxone 1 g IV every 24 hours  Acute renal failure on CKD stage IV Patient presenting with a creatinine elevated to 3.79, baseline creatinine 2.1-2.4.  Etiology likely secondary to prerenal azotemia in the setting of dehydration from poor oral intake versus ATN from Bactrim use outpatient. -- Cr 3.79>3.50 -- Continue IVF with NS at 100 MLS per hour -- Strict monitoring of urinary output via SPC -- Avoid nephrotoxins, renally dose all medications -- BMP daily  Chronic urinary retention/hydronephrosis Patient with a history of chronic urine tension and hydronephrosis followed by urology outpatient Dr. Erlene Quan.  Patient underwent cystoscopy with SPC placement on 03/12/2022.  CT abdomen/pelvis on admission shows minimal hydronephrosis which is improved from previous that was reported as severe.  Continue outpatient follow-up with urology.  Essential hypertension Chronic diastolic congestive heart failure, compensated -- Hydralazine 10 mg p.o. daily -- Hold home torsemide 20 mg p.o. daily -- Strict I's and O's and daily weights, monitor BP closely  CAD/peripheral vascular disease Dyslipidemia Not on antiplatelets or statin outpatient  Seizure disorder -- Keppra 1000 mg p.o. twice daily  Depression/anxiety -- Celexa 20 mg p.o. daily  Iron deficiency anemia -- Ferrous sulfate 325 mg p.o. daily  GERD: Protonix 40 mg p.o. daily  Morbid obesity Body mass  index is 35.71 kg/m.  Discussed with patient needs  for aggressive lifestyle changes/weight loss as this complicates all facets of care.  Outpatient follow-up with PCP.    DVT prophylaxis: heparin injection 5,000 Units Start: 04/29/22 0600    Code Status: Full Code Family Communication: No family present at bedside this morning  Disposition Plan:  Level of care: Med-Surg Status is: Inpatient Remains inpatient appropriate because: IV fluid hydration, needs close monitoring of renal function, IV antibiotics anticipate discharge home in 2-3 days    Consultants:  None  Procedures:  None  Antimicrobials:  Ceftriaxone 12/30>>   Subjective: Patient seen examined bedside, resting comfortably.  Lying in bed.  Pain currently controlled.  Discussed with patient findings of CT abdomen/pelvis that were unrevealing and that we are continue to treat for her urinary tract infection with IV antibiotics.  Also her creatinine slightly improved since initial presentation and continues on IV fluid hydration.  No other questions or concerns at this time.  Denies headache, no dizziness, no chest pain, no palpitations, no shortness of breath, no current abdominal pain, no fever/chills/night sweats, no nausea/vomiting/diarrhea, no focal weakness, no cough/congestion, no paresthesias.  No acute events overnight per nursing staff.  Objective: Vitals:   04/29/22 0900 04/29/22 1000 04/29/22 1130 04/29/22 1200  BP: (!) 98/45 95/74 (!) 109/47 107/89  Pulse: (!) 56 (!) 55 (!) 56 60  Resp:  '18 16 16  '$ Temp:  98.4 F (36.9 C)    TempSrc:  Oral    SpO2: 100% 100% 100% 100%  Weight:      Height:        Intake/Output Summary (Last 24 hours) at 04/29/2022 1344 Last data filed at 04/29/2022 1341 Gross per 24 hour  Intake 2031.34 ml  Output --  Net 2031.34 ml   Filed Weights   04/28/22 2045  Weight: 80.2 kg    Examination:  Physical Exam: GEN: NAD, alert and oriented x 3, elderly in appearance HEENT: NCAT, PERRL, EOMI, sclera clear, MMM PULM: CTAB  w/o wheezes/crackles, normal respiratory effort, on room air CV: RRR w/o M/G/R GU: Suprapubic catheter noted in place GI: abd soft, NTND, NABS, no R/G/M MSK: no peripheral edema, muscle strength globally intact 5/5 bilateral upper/lower extremities NEURO: CN II-XII intact, no focal deficits, sensation to light touch intact PSYCH: normal mood/affect Integumentary: dry/intact, no rashes or wounds    Data Reviewed: I have personally reviewed following labs and imaging studies  CBC: Recent Labs  Lab 04/24/22 0600 04/28/22 2049 04/29/22 0416  WBC 4.5 5.9 4.4  HGB 10.1* 10.2* 9.4*  HCT 32.3* 32.5* 30.7*  MCV 102.5* 102.2* 103.4*  PLT 244 231 423   Basic Metabolic Panel: Recent Labs  Lab 04/24/22 0600 04/28/22 2049 04/29/22 0416  NA 138 138 139  K 2.9* 3.7 4.5  CL 105 103 109  CO2 21* 23 22  GLUCOSE 103* 128* 97  BUN 32* 32* 31*  CREATININE 3.54* 3.79* 3.50*  CALCIUM 9.1 9.5 8.8*   GFR: Estimated Creatinine Clearance: 13.1 mL/min (A) (by C-G formula based on SCr of 3.5 mg/dL (H)). Liver Function Tests: Recent Labs  Lab 04/24/22 0600 04/28/22 2049  AST 19 20  ALT 23 14  ALKPHOS 88 82  BILITOT 0.8 0.5  PROT 7.3 7.5  ALBUMIN 3.3* 3.4*   Recent Labs  Lab 04/24/22 0600 04/28/22 2049  LIPASE 39 42   No results for input(s): "AMMONIA" in the last 168 hours. Coagulation Profile: No results for input(s): "INR", "PROTIME" in  the last 168 hours. Cardiac Enzymes: No results for input(s): "CKTOTAL", "CKMB", "CKMBINDEX", "TROPONINI" in the last 168 hours. BNP (last 3 results) No results for input(s): "PROBNP" in the last 8760 hours. HbA1C: No results for input(s): "HGBA1C" in the last 72 hours. CBG: No results for input(s): "GLUCAP" in the last 168 hours. Lipid Profile: No results for input(s): "CHOL", "HDL", "LDLCALC", "TRIG", "CHOLHDL", "LDLDIRECT" in the last 72 hours. Thyroid Function Tests: No results for input(s): "TSH", "T4TOTAL", "FREET4", "T3FREE",  "THYROIDAB" in the last 72 hours. Anemia Panel: No results for input(s): "VITAMINB12", "FOLATE", "FERRITIN", "TIBC", "IRON", "RETICCTPCT" in the last 72 hours. Sepsis Labs: Recent Labs  Lab 04/24/22 0600  LATICACIDVEN 1.2    Recent Results (from the past 240 hour(s))  Resp panel by RT-PCR (RSV, Flu A&B, Covid) Anterior Nasal Swab     Status: None   Collection Time: 04/24/22  6:00 AM   Specimen: Anterior Nasal Swab  Result Value Ref Range Status   SARS Coronavirus 2 by RT PCR NEGATIVE NEGATIVE Final    Comment: (NOTE) SARS-CoV-2 target nucleic acids are NOT DETECTED.  The SARS-CoV-2 RNA is generally detectable in upper respiratory specimens during the acute phase of infection. The lowest concentration of SARS-CoV-2 viral copies this assay can detect is 138 copies/mL. A negative result does not preclude SARS-Cov-2 infection and should not be used as the sole basis for treatment or other patient management decisions. A negative result may occur with  improper specimen collection/handling, submission of specimen other than nasopharyngeal swab, presence of viral mutation(s) within the areas targeted by this assay, and inadequate number of viral copies(<138 copies/mL). A negative result must be combined with clinical observations, patient history, and epidemiological information. The expected result is Negative.  Fact Sheet for Patients:  EntrepreneurPulse.com.au  Fact Sheet for Healthcare Providers:  IncredibleEmployment.be  This test is no t yet approved or cleared by the [place] FDA and  has been authorized for detection and/or diagnosis of SARS-CoV-2 by FDA under an Emergency Use Authorization (EUA). This EUA will remain  in effect (meaning this test can be used) for the duration of the COVID-19 declaration under Section 564(b)(1) of the Act, 21 U.S.C.section 360bbb-3(b)(1), unless the authorization is terminated  or revoked sooner.        Influenza A by PCR NEGATIVE NEGATIVE Final   Influenza B by PCR NEGATIVE NEGATIVE Final    Comment: (NOTE) The Xpert Xpress SARS-CoV-2/FLU/RSV plus assay is intended as an aid in the diagnosis of influenza from Nasopharyngeal swab specimens and should not be used as a sole basis for treatment. Nasal washings and aspirates are unacceptable for Xpert Xpress SARS-CoV-2/FLU/RSV testing.  Fact Sheet for Patients: EntrepreneurPulse.com.au  Fact Sheet for Healthcare Providers: IncredibleEmployment.be  This test is not yet approved or cleared by the [place] FDA and has been authorized for detection and/or diagnosis of SARS-CoV-2 by FDA under an Emergency Use Authorization (EUA). This EUA will remain in effect (meaning this test can be used) for the duration of the COVID-19 declaration under Section 564(b)(1) of the Act, 21 U.S.C. section 360bbb-3(b)(1), unless the authorization is terminated or revoked.     Resp Syncytial Virus by PCR NEGATIVE NEGATIVE Final    Comment: (NOTE) Fact Sheet for Patients: EntrepreneurPulse.com.au  Fact Sheet for Healthcare Providers: IncredibleEmployment.be  This test is not yet approved or cleared by the [place] FDA and has been authorized for detection and/or diagnosis of SARS-CoV-2 by FDA under an Emergency Use Authorization (EUA). This EUA will  remain in effect (meaning this test can be used) for the duration of the COVID-19 declaration under Section 564(b)(1) of the Act, 21 U.S.C. section 360bbb-3(b)(1), unless the authorization is terminated or revoked.  Performed at [facility], [address], [place], [place] [address]   Urine Culture     Status: None   Collection Time: 04/24/22 11:22 AM   Specimen: Urine, Clean Catch  Result Value Ref Range Status   Specimen Description   Final    URINE, CLEAN CATCH Performed at [facility],  [address] [place], [place] [address]    Special Requests   Final    NONE Performed at [facility], [address] [place], [place] [address]    Culture   Final    NO GROWTH Performed at [facility], [place] [address] [place], [place] [address]    Report Status 04/25/2022 FINAL  Final  Wet prep, genital     Status: None   Collection Time: 04/29/22  1:23 AM  Result Value Ref Range Status   Yeast Wet Prep HPF POC NONE SEEN NONE SEEN Final   Trich, Wet Prep NONE SEEN NONE SEEN Final   Clue Cells Wet Prep HPF POC NONE SEEN NONE SEEN Final   WBC, Wet Prep HPF POC <10 <10 Final   Sperm NONE SEEN  Final    Comment: Performed at [facility], [address], [place],  [address]         Radiology Studies: CT ABDOMEN PELVIS WO CONTRAST  Result Date: 04/29/2022 CLINICAL DATA:  Acute abdominal pain. Most recent surgery 03/22/2022 with laparotomy with LOA, before this was insertion of suprapubic bladder catheter 03/12/2022. Currently taking antibiotics. EXAM: CT ABDOMEN AND PELVIS WITHOUT CONTRAST TECHNIQUE: Multidetector CT imaging of the abdomen and pelvis was performed following the standard protocol without IV contrast. RADIATION DOSE REDUCTION: This exam was performed according to the departmental dose-optimization program which includes automated exposure control, adjustment of the mA and/or kV according to patient size and/or use of iterative reconstruction technique. COMPARISON:  Recent CT without contrast 04/24/2022, CT with contrast 03/28/2022 FINDINGS: Lower chest: There is bronchial thickening in the lower lobes and mosaic attenuation consistent with small airways disease with air trapping. No lung base pneumonia is seen. There is scattered linear scarring or atelectasis. The cardiac size is normal. Small hiatal hernia. Hepatobiliary: The liver is unremarkable without contrast. The gallbladder and bile ducts are unremarkable. Pancreas: No  abnormality. Spleen: No abnormality.  No splenomegaly. Adrenals/Urinary Tract: There is no adrenal mass. Bilateral renal scarring and cortical volume loss are again shown with mild chronic hydronephrosis and urothelial thickening along the renal pelves and proximal ureters. Thickening of the bladder is again noted, appears similar to the prior studies, with suprapubic bladder catheter unchanged in positioning and appearance including the extra-abdominal segment. No new abnormality is seen such as air in the renal collecting systems, renal parenchyma or the bladder wall, or interval development of increased perinephric edema or perinephric fluid. Stomach/Bowel: Small hiatal hernia. No dilatation or wall thickening including of the appendix. Vascular/Lymphatic: Aortic atherosclerosis. No enlarged abdominal or pelvic lymph nodes. Reproductive: Uterus and bilateral adnexa are unremarkable. Other: No free fluid, free air or free hemorrhage. There is no incarcerated hernia. Musculoskeletal: Osteopenia and degenerative change thoracic and lumbar spine, mild hip DJD. There is pelvic enthesopathy. Severe acquired foraminal stenosis L4-5, L5-S1 right greater than left. IMPRESSION: 1. Suprapubic bladder catheter with no acute complication identified. 2.  Chronic renal scarring and volume loss with mild hydronephrosis, urothelial and bladder thickening unchanged. 3. Small hiatal hernia. 4. Aortic atherosclerosis. 5. Bronchitis and air trapping in the lung bases. 6. Osteopenia and degenerative change with severe acquired foraminal stenosis L4-5 and L5-S1 right greater than left. Aortic Atherosclerosis (ICD10-I70.0). Electronically Signed   By: Telford Nab M.D.   On: 04/29/2022 02:15        Scheduled Meds:  cholecalciferol  1,000 Units Oral Daily   citalopram  20 mg Oral Daily   feeding supplement  237 mL Oral BID BM   ferrous sulfate  325 mg Oral Q breakfast   heparin injection (subcutaneous)  5,000 Units  Subcutaneous Q8H   hydrALAZINE  10 mg Oral Daily   levETIRAcetam  1,000 mg Oral BID   multivitamin with minerals  1 tablet Oral Daily   pantoprazole  20 mg Oral Daily   polyethylene glycol  17 g Oral Daily   tamsulosin  0.4 mg Oral Daily   Continuous Infusions:  sodium chloride 100 mL/hr at 04/29/22 1341   [START ON 04/30/2022] cefTRIAXone (ROCEPHIN)  IV       LOS: 0 days    Time spent: 51 minutes spent on chart review, discussion with nursing staff, consultants, updating family and interview/physical exam; more than 50% of that time was spent in counseling and/or coordination of care.    Babatunde Seago J [place] DO [facility] Available via Epic secure chat 7am-7pm After these hours, please refer to coverage provider listed on [email or web address] 04/29/2022, 1:44 PM

## 2022-04-29 NOTE — Assessment & Plan Note (Signed)
-   We will continue her Keppra.

## 2022-04-30 ENCOUNTER — Encounter: Payer: Self-pay | Admitting: Family Medicine

## 2022-04-30 DIAGNOSIS — G40909 Epilepsy, unspecified, not intractable, without status epilepticus: Secondary | ICD-10-CM | POA: Diagnosis not present

## 2022-04-30 DIAGNOSIS — N136 Pyonephrosis: Secondary | ICD-10-CM | POA: Diagnosis not present

## 2022-04-30 DIAGNOSIS — T368X5A Adverse effect of other systemic antibiotics, initial encounter: Secondary | ICD-10-CM | POA: Diagnosis not present

## 2022-04-30 DIAGNOSIS — N170 Acute kidney failure with tubular necrosis: Secondary | ICD-10-CM | POA: Diagnosis not present

## 2022-04-30 DIAGNOSIS — F32A Depression, unspecified: Secondary | ICD-10-CM | POA: Diagnosis not present

## 2022-04-30 DIAGNOSIS — Z8249 Family history of ischemic heart disease and other diseases of the circulatory system: Secondary | ICD-10-CM | POA: Diagnosis not present

## 2022-04-30 DIAGNOSIS — Z6835 Body mass index (BMI) 35.0-35.9, adult: Secondary | ICD-10-CM | POA: Diagnosis not present

## 2022-04-30 DIAGNOSIS — E785 Hyperlipidemia, unspecified: Secondary | ICD-10-CM | POA: Diagnosis not present

## 2022-04-30 DIAGNOSIS — I130 Hypertensive heart and chronic kidney disease with heart failure and stage 1 through stage 4 chronic kidney disease, or unspecified chronic kidney disease: Secondary | ICD-10-CM | POA: Diagnosis not present

## 2022-04-30 DIAGNOSIS — T83518A Infection and inflammatory reaction due to other urinary catheter, initial encounter: Secondary | ICD-10-CM | POA: Diagnosis not present

## 2022-04-30 DIAGNOSIS — Z8673 Personal history of transient ischemic attack (TIA), and cerebral infarction without residual deficits: Secondary | ICD-10-CM | POA: Diagnosis not present

## 2022-04-30 DIAGNOSIS — D7589 Other specified diseases of blood and blood-forming organs: Secondary | ICD-10-CM | POA: Diagnosis not present

## 2022-04-30 DIAGNOSIS — E860 Dehydration: Secondary | ICD-10-CM | POA: Diagnosis not present

## 2022-04-30 DIAGNOSIS — I2510 Atherosclerotic heart disease of native coronary artery without angina pectoris: Secondary | ICD-10-CM | POA: Diagnosis not present

## 2022-04-30 DIAGNOSIS — Y92009 Unspecified place in unspecified non-institutional (private) residence as the place of occurrence of the external cause: Secondary | ICD-10-CM | POA: Diagnosis not present

## 2022-04-30 DIAGNOSIS — F79 Unspecified intellectual disabilities: Secondary | ICD-10-CM | POA: Diagnosis not present

## 2022-04-30 DIAGNOSIS — N179 Acute kidney failure, unspecified: Secondary | ICD-10-CM | POA: Diagnosis not present

## 2022-04-30 DIAGNOSIS — N184 Chronic kidney disease, stage 4 (severe): Secondary | ICD-10-CM | POA: Diagnosis not present

## 2022-04-30 DIAGNOSIS — E1151 Type 2 diabetes mellitus with diabetic peripheral angiopathy without gangrene: Secondary | ICD-10-CM | POA: Diagnosis not present

## 2022-04-30 DIAGNOSIS — Z87891 Personal history of nicotine dependence: Secondary | ICD-10-CM | POA: Diagnosis not present

## 2022-04-30 DIAGNOSIS — Z79899 Other long term (current) drug therapy: Secondary | ICD-10-CM | POA: Diagnosis not present

## 2022-04-30 DIAGNOSIS — E1122 Type 2 diabetes mellitus with diabetic chronic kidney disease: Secondary | ICD-10-CM | POA: Diagnosis not present

## 2022-04-30 DIAGNOSIS — D509 Iron deficiency anemia, unspecified: Secondary | ICD-10-CM | POA: Diagnosis not present

## 2022-04-30 DIAGNOSIS — Y846 Urinary catheterization as the cause of abnormal reaction of the patient, or of later complication, without mention of misadventure at the time of the procedure: Secondary | ICD-10-CM | POA: Diagnosis not present

## 2022-04-30 DIAGNOSIS — I5032 Chronic diastolic (congestive) heart failure: Secondary | ICD-10-CM | POA: Diagnosis not present

## 2022-04-30 DIAGNOSIS — K219 Gastro-esophageal reflux disease without esophagitis: Secondary | ICD-10-CM | POA: Diagnosis not present

## 2022-04-30 LAB — URINE CULTURE: Culture: NO GROWTH

## 2022-04-30 LAB — BASIC METABOLIC PANEL
Anion gap: 6 (ref 5–15)
BUN: 25 mg/dL — ABNORMAL HIGH (ref 8–23)
CO2: 20 mmol/L — ABNORMAL LOW (ref 22–32)
Calcium: 8.5 mg/dL — ABNORMAL LOW (ref 8.9–10.3)
Chloride: 112 mmol/L — ABNORMAL HIGH (ref 98–111)
Creatinine, Ser: 2.67 mg/dL — ABNORMAL HIGH (ref 0.44–1.00)
GFR, Estimated: 18 mL/min — ABNORMAL LOW (ref >60)
Glucose, Bld: 86 mg/dL (ref 70–99)
Potassium: 3.8 mmol/L (ref 3.5–5.1)
Sodium: 138 mmol/L (ref 135–145)

## 2022-04-30 MED ORDER — CEFDINIR 300 MG PO CAPS: 300.0000 mg | ORAL_CAPSULE | Freq: Two times a day (BID) | ORAL | 0 refills | Status: AC

## 2022-04-30 NOTE — Care Management Important Message (Signed)
Important Message  Patient Details  Name: Leslie Houston MRN: [id number] Date of Birth: 26-Oct-1950   Medicare Important Message Given:  N/A - LOS <3 / Initial given by admissions     Dannette Barbara 04/30/2022, 1:48 PM

## 2022-04-30 NOTE — Discharge Summary (Signed)
Physician Discharge Summary  Leslie Houston EGB:[id number] DOB: 29-Mar-1951 DOA: 04/28/2022  PCP: Teodora Medici, DO  Admit date: 04/28/2022 Discharge date: 04/30/2022  Admitted From: Home Disposition: Home  Recommendations for Outpatient Follow-up:  Follow up with PCP in 1-2 weeks Follow-up with urology, Dr. Erlene Quan as scheduled on 05/01/2022, will need SBP exchange Continue antibiotics with cefdinir for UTI Please obtain BMP in one week for recheck of renal function  Home Health: Home PT and OT home health services on discharge Equipment/Devices: None  Discharge Condition: Stable CODE STATUS: Full code Diet recommendation: Heart healthy diet  History of present illness:  Leslie Houston is a 72 y.o. female with past medical history significant for chronic diastolic congestive heart failure, CAD, CKD stage IV, type 2 diabetes mellitus, essential hypertension, dyslipidemia, PVD, seizure disorder who presented to [facility] ED on 12/30 via EMS from home complaining about lower abdominal/suprapubic pain.  Patient recently prescribed antibiotics for urinary tract infection with Bactrim with urine culture positive for E. coli and Klebsiella with appropriate sensitivity.  Also with recent suprapubic catheter placement by urology in November 2023.  Daughter concerned regarding her suprapubic catheter that was placed last month.  Noted dark-colored urine with sediment but denied any fever/chills, no flank pain, no nausea/vomiting.  Pain also extends to her vaginal area but no reports of vaginal bleeding or discharge.   In the ED, temperature 98.0 F, HR 64, RR 16, BP 118/49, SpO2 98% on room air.  WBC 5.9, hemoglobin 10.2, platelets 231.  Sodium 138, potassium 3.7, chloride 103, CO2 23, glucose 128, BUN 32, creatinine 3.79.  AST 20, ALT 14, total bilirubin 0.5.  Lipase 20.  Urinalysis with large leukocytes, rare bacteria, greater than 50 WBCs.  CT abdomen/pelvis without contrast with suprapubic  catheter in place with no acute complication identified, chronic renal scarring and volume loss with mild hydronephrosis and urothelial and bladder wall thickening unchanged, small hiatal hernia.  Patient underwent vaginal exam by EDP with wet prep that was negative for yeast/trichomonas/clue cells.  Urine culture obtained.  Patient was given 1 L NS bolus, 4 mg IV morphine, 1 g IV Rocephin.  TRH consulted for admission for further evaluation management of UTI and acute on chronic renal failure.  Hospital course:  Urinary tract infection Patient presenting with lower abdominal/suprapubic pain in the setting of recent suprapubic catheter placement by urology November 2023.  Recent diagnosis of UTI with E. coli/Klebsiella and has been taking Bactrim outpatient.  Patient is afebrile without leukocytosis.  Urinalysis with large leukocytes, negative nitrite, rare bacteria and greater than 50 WBCs.  CT abdomen/pelvis without contrast shows improvement of hydronephrosis, now minimal with no other acute findings.  Patient was started on ceftriaxone.  Urine culture showed no growth.  Possible partial treatment of previous UTI as she was on Bactrim previously.  Will continue cefdinir 300 mg p.o. twice daily on discharge to complete total course of 7 days.  Outpatient follow-up with PCP/urology.   Acute renal failure on CKD stage IV Patient presenting with a creatinine elevated to 3.79, baseline creatinine 2.1-2.4.  Etiology likely secondary to prerenal azotemia in the setting of dehydration from poor oral intake versus ATN from Bactrim use outpatient.  Patient was supported with IV fluids with improvement of creatinine to 2.67 at time of discharge.  Recommend repeat BMP 1 week.   Chronic urinary retention/hydronephrosis Patient with a history of chronic urine tension and hydronephrosis followed by urology outpatient Dr. Erlene Quan.  Patient underwent cystoscopy with SPC placement on 03/12/2022.  CT abdomen/pelvis on  admission shows minimal hydronephrosis which is improved from previous that was reported as severe.  Continue outpatient follow-up with urology as scheduled on 05/01/2022.  Will need suprapubic catheter exchange.   Essential hypertension Chronic diastolic congestive heart failure, compensated Continue torsemide and hydralazine   CAD/peripheral vascular disease Dyslipidemia Not on antiplatelets or statin outpatient   Seizure disorder Continue Keppra 1 mg p.o. twice daily   Depression/anxiety Continue Celexa 20 mg p.o. daily   Iron deficiency anemia Continue ferrous sulfate 325 mg p.o. daily   GERD: Protonix 40 mg p.o. daily   Morbid obesity Body mass index is 35.71 kg/m.  Discussed with patient needs for aggressive lifestyle changes/weight loss as this complicates all facets of care.  Outpatient follow-up with PCP.  Discharge Diagnoses:  Principal Problem:   AKI (acute kidney injury) ([place]) Active Problems:   Recurrent UTI   Essential hypertension   Seizure disorder ([place])   GERD without esophagitis    Discharge Instructions  Discharge Instructions     Call MD for:  difficulty breathing, headache or visual disturbances   Complete by: As directed    Call MD for:  extreme fatigue   Complete by: As directed    Call MD for:  persistant dizziness or light-headedness   Complete by: As directed    Call MD for:  persistant nausea and vomiting   Complete by: As directed    Call MD for:  severe uncontrolled pain   Complete by: As directed    Call MD for:  temperature >100.4   Complete by: As directed    Diet - low sodium heart healthy   Complete by: As directed    Increase activity slowly   Complete by: As directed       Allergies as of 04/30/2022       Reactions   Latex Rash   Penicillin G Itching   Atorvastatin Other (See Comments)   Myalgias    Other Hives   Adhesive tape   Amlodipine Swelling   Leg swelling   Doxycycline    Blood in urine         Medication List     STOP taking these medications    sulfamethoxazole-trimethoprim 800-160 MG tablet Commonly known as: BACTRIM DS       TAKE these medications    cefdinir 300 MG capsule Commonly known as: OMNICEF Take 1 capsule (300 mg total) by mouth 2 (two) times daily for 5 days.   citalopram 20 MG tablet Commonly known as: CELEXA TAKE 1 TABLET BY MOUTH ONCE A DAY   feeding supplement Liqd Take 237 mLs by mouth 2 (two) times daily between meals.   ferrous sulfate 325 (65 FE) MG tablet Take 325 mg by mouth daily with breakfast.   fluticasone 50 MCG/ACT nasal spray Commonly known as: FLONASE Place 2 sprays into both nostrils daily as needed for allergies (allergies).   hydrALAZINE 10 MG tablet Commonly known as: APRESOLINE Take 10 mg by mouth daily.   levETIRAcetam 1000 MG tablet Commonly known as: KEPPRA Take 1 tablet (1,000 mg total) by mouth 2 (two) times daily.   loratadine 10 MG tablet Commonly known as: CLARITIN TAKE 1 TABLET BY MOUTH ONCE A DAY AS NEEDED FOR ALLERGIES   multivitamin with minerals Tabs tablet Take 1 tablet by mouth daily.   ondansetron 4 MG disintegrating tablet Commonly known as: ZOFRAN-ODT Take 1 tablet (4 mg total) by mouth every 8 (eight) hours as needed.  pantoprazole 20 MG tablet Commonly known as: PROTONIX TAKE 1 TABLET BY MOUTH ONCE A DAY   polyethylene glycol 17 g packet Commonly known as: MIRALAX / GLYCOLAX Take 17 g by mouth daily.   tamsulosin 0.4 MG Caps capsule Commonly known as: FLOMAX Take 1 capsule (0.4 mg total) by mouth daily. Take one in the morning and one at night.   torsemide 20 MG tablet Commonly known as: DEMADEX TAKE 1 TABLET BY MOUTH ONCE DAILY   Vitamin D3 25 MCG (1000 UT) Caps Take 1 capsule by mouth daily.        Follow-up Information     Teodora Medici, DO. Schedule an appointment as soon as possible for a visit in 1 week(s).   Specialty: Internal Medicine Contact  information: [address] [place] [address] [phone]         [name], MD. Go on 05/01/2022.   Specialty: Urology Contact information: [place] [address] [phone]                Allergies  Allergen Reactions   Latex Rash   Penicillin G Itching   Atorvastatin Other (See Comments)    Myalgias    Other Hives    Adhesive tape   Amlodipine Swelling    Leg swelling   Doxycycline     Blood in urine    Consultations: None   Procedures/Studies: CT ABDOMEN PELVIS WO CONTRAST  Result Date: 04/29/2022 CLINICAL DATA:  Acute abdominal pain. Most recent surgery 03/22/2022 with laparotomy with LOA, before this was insertion of suprapubic bladder catheter 03/12/2022. Currently taking antibiotics. EXAM: CT ABDOMEN AND PELVIS WITHOUT CONTRAST TECHNIQUE: Multidetector CT imaging of the abdomen and pelvis was performed following the standard protocol without IV contrast. RADIATION DOSE REDUCTION: This exam was performed according to the departmental dose-optimization program which includes automated exposure control, adjustment of the mA and/or kV according to patient size and/or use of iterative reconstruction technique. COMPARISON:  Recent CT without contrast 04/24/2022, CT with contrast 03/28/2022 FINDINGS: Lower chest: There is bronchial thickening in the lower lobes and mosaic attenuation consistent with small airways disease with air trapping. No lung base pneumonia is seen. There is scattered linear scarring or atelectasis. The cardiac size is normal. Small hiatal hernia. Hepatobiliary: The liver is unremarkable without contrast. The gallbladder and bile ducts are unremarkable. Pancreas: No abnormality. Spleen: No abnormality.  No splenomegaly. Adrenals/Urinary Tract: There is no adrenal mass. Bilateral renal scarring and cortical volume loss are again shown with mild chronic hydronephrosis and urothelial  thickening along the renal pelves and proximal ureters. Thickening of the bladder is again noted, appears similar to the prior studies, with suprapubic bladder catheter unchanged in positioning and appearance including the extra-abdominal segment. No new abnormality is seen such as air in the renal collecting systems, renal parenchyma or the bladder wall, or interval development of increased perinephric edema or perinephric fluid. Stomach/Bowel: Small hiatal hernia. No dilatation or wall thickening including of the appendix. Vascular/Lymphatic: Aortic atherosclerosis. No enlarged abdominal or pelvic lymph nodes. Reproductive: Uterus and bilateral adnexa are unremarkable. Other: No free fluid, free air or free hemorrhage. There is no incarcerated hernia. Musculoskeletal: Osteopenia and degenerative change thoracic and lumbar spine, mild hip DJD. There is pelvic enthesopathy. Severe acquired foraminal stenosis L4-5, L5-S1 right greater than left. IMPRESSION: 1. Suprapubic bladder catheter with no acute complication identified. 2. Chronic renal scarring and volume loss with mild hydronephrosis, urothelial and bladder thickening unchanged.  3. Small hiatal hernia. 4. Aortic atherosclerosis. 5. Bronchitis and air trapping in the lung bases. 6. Osteopenia and degenerative change with severe acquired foraminal stenosis L4-5 and L5-S1 right greater than left. Aortic Atherosclerosis (ICD10-I70.0). Electronically Signed   By: Telford Nab M.D.   On: 04/29/2022 02:15   CT ABDOMEN PELVIS WO CONTRAST  Result Date: 04/24/2022 CLINICAL DATA:  Pain at the previous surgical site of the abdomen. EXAM: CT ABDOMEN AND PELVIS WITHOUT CONTRAST TECHNIQUE: Multidetector CT imaging of the abdomen and pelvis was performed following the standard protocol without IV contrast. RADIATION DOSE REDUCTION: This exam was performed according to the departmental dose-optimization program which includes automated exposure control, adjustment of  the mA and/or kV according to patient size and/or use of iterative reconstruction technique. COMPARISON:  03/28/2022.  03/21/2022. FINDINGS: Lower chest: Minimal scarring/atelectasis at the right lung base, improved since the study of 1 month ago. No worsening or new finding. Hepatobiliary: Liver parenchyma is normal without contrast. No calcified gallstones. Pancreas: Normal Spleen: Normal Adrenals/Urinary Tract: Adrenal glands are normal. Chronic bilateral renal volume loss. Mild chronic fullness of the renal collecting systems. Chronic suprapubic cystostomy in place. Balloon as well inflated. Chronic bladder wall thickening. Course of the cystostomy tube appears normal in there are no discernible complicating features or changes. Stomach/Bowel: Stomach and small intestine are normal. Normal colon. No sign of small bowel obstruction presently. Vascular/Lymphatic: Aortic atherosclerosis. No aneurysm. IVC is normal. No adenopathy. Reproductive: No pelvic mass. Other: No free fluid or air. Musculoskeletal: Chronic degenerative changes affect the spine. IMPRESSION: 1. No acute finding by CT. No evidence of bowel obstruction presently. 2. Chronic bilateral renal volume loss. Chronic fullness of the renal collecting systems. Chronic suprapubic cystostomy tube in place. Chronic bladder wall thickening. No complicating feature seen along the course of the cystostomy tube or the surgical approach. 3. Aortic atherosclerosis. 4. Minimal scarring/atelectasis at the right lung base, improved since the study of 1 month ago. No worsening or new finding. Aortic Atherosclerosis (ICD10-I70.0). Electronically Signed   By: Nelson Chimes M.D.   On: 04/24/2022 09:57   DG Abd 1 View  Result Date: 04/03/2022 CLINICAL DATA:  Abdominal distension. EXAM: ABDOMEN - 1 VIEW COMPARISON:  April 01, 2022. FINDINGS: Stable mildly dilated small bowel loops are noted. No colonic dilatation is noted. No radio-opaque calculi or other significant  radiographic abnormality are seen. IMPRESSION: Stable small bowel dilatation suggesting ileus. Electronically Signed   By: Marijo Conception M.D.   On: 04/03/2022 10:35   DG ABD ACUTE 2+V W 1V CHEST  Result Date: 04/01/2022 CLINICAL DATA:  Small-bowel obstruction. EXAM: DG ABDOMEN ACUTE WITH 1 VIEW CHEST COMPARISON:  03/31/2022 FINDINGS: Skin staple seen in the lower anterior abdominal wall soft tissues. Orogastric tube tip remains in the proximal stomach. Mild diffuse dilatation of small bowel and colon show mild improvement since previous study. Mild cardiomegaly is noted.  Both lungs are clear. IMPRESSION: Mild improvement in diffuse small bowel and colonic dilatation, suspicious for ileus. Electronically Signed   By: Marlaine Hind M.D.   On: 04/01/2022 09:21     Subjective: Patient seen examined bedside, resting calmly.  Lying in bed with family present.  Abdominal/suprapubic pain has now resolved.  Creatinine now close to baseline.  Patient has appointment with her urologist tomorrow for catheter exchange.  No other questions or concerns at this time.  Denies headache, no dizziness, no chest pain, no palpitations, no shortness of breath, no abdominal pain, no fever/chills/night sweats, no  nausea/vomiting/diarrhea, no focal weakness, no fatigue, no paresthesias.  No acute events overnight per nurse staff.  Discharge Exam: Vitals:   04/30/22 0802 04/30/22 0940  BP: (!) 141/79 138/64  Pulse: 87 (!) 55  Resp: 18 18  Temp: (!) 97.4 F (36.3 C) (!) 97.5 F (36.4 C)  SpO2: 98% 99%   Vitals:   04/30/22 0102 04/30/22 0511 04/30/22 0802 04/30/22 0940  BP: (!) 116/58 (!) 125/44 (!) 141/79 138/64  Pulse: (!) 49 (!) 55 87 (!) 55  Resp: '16 16 18 18  '$ Temp:  98 F (36.7 C) (!) 97.4 F (36.3 C) (!) 97.5 F (36.4 C)  TempSrc:  Oral Oral Oral  SpO2: 98% 100% 98% 99%  Weight:      Height:        Physical Exam: GEN: NAD, alert and oriented x 3, elderly in appearance HEENT: NCAT, PERRL, EOMI,  sclera clear, MMM PULM: CTAB w/o wheezes/crackles, normal respiratory effort, on room air CV: RRR w/o M/G/R GI: abd soft, NTND, NABS, no R/G/M GU: Suprapubic catheter noted in place MSK: no peripheral edema, muscle strength globally intact 5/5 bilateral upper/lower extremities NEURO: CN II-XII intact, no focal deficits, sensation to light touch intact PSYCH: normal mood/affect Integumentary: dry/intact, no rashes or wounds    The results of significant diagnostics from this hospitalization (including imaging, microbiology, ancillary and laboratory) are listed below for reference.     Microbiology: Recent Results (from the past 240 hour(s))  Resp panel by RT-PCR (RSV, Flu A&B, Covid) Anterior Nasal Swab     Status: None   Collection Time: 04/24/22  6:00 AM   Specimen: Anterior Nasal Swab  Result Value Ref Range Status   SARS Coronavirus 2 by RT PCR NEGATIVE NEGATIVE Final    Comment: (NOTE) SARS-CoV-2 target nucleic acids are NOT DETECTED.  The SARS-CoV-2 RNA is generally detectable in upper respiratory specimens during the acute phase of infection. The lowest concentration of SARS-CoV-2 viral copies this assay can detect is 138 copies/mL. A negative result does not preclude SARS-Cov-2 infection and should not be used as the sole basis for treatment or other patient management decisions. A negative result may occur with  improper specimen collection/handling, submission of specimen other than nasopharyngeal swab, presence of viral mutation(s) within the areas targeted by this assay, and inadequate number of viral copies(<138 copies/mL). A negative result must be combined with clinical observations, patient history, and epidemiological information. The expected result is Negative.  Fact Sheet for Patients:  EntrepreneurPulse.com.au  Fact Sheet for Healthcare Providers:  IncredibleEmployment.be  This test is no t yet approved or cleared by  the [place] FDA and  has been authorized for detection and/or diagnosis of SARS-CoV-2 by FDA under an Emergency Use Authorization (EUA). This EUA will remain  in effect (meaning this test can be used) for the duration of the COVID-19 declaration under Section 564(b)(1) of the Act, 21 U.S.C.section 360bbb-3(b)(1), unless the authorization is terminated  or revoked sooner.       Influenza A by PCR NEGATIVE NEGATIVE Final   Influenza B by PCR NEGATIVE NEGATIVE Final    Comment: (NOTE) The Xpert Xpress SARS-CoV-2/FLU/RSV plus assay is intended as an aid in the diagnosis of influenza from Nasopharyngeal swab specimens and should not be used as a sole basis for treatment. Nasal washings and aspirates are unacceptable for Xpert Xpress SARS-CoV-2/FLU/RSV testing.  Fact Sheet for Patients: EntrepreneurPulse.com.au  Fact Sheet for Healthcare Providers: IncredibleEmployment.be  This test is not yet approved or cleared  by the [place] and has been authorized for detection and/or diagnosis of SARS-CoV-2 by FDA under an Emergency Use Authorization (EUA). This EUA will remain in effect (meaning this test can be used) for the duration of the COVID-19 declaration under Section 564(b)(1) of the Act, 21 U.S.C. section 360bbb-3(b)(1), unless the authorization is terminated or revoked.     Resp Syncytial Virus by PCR NEGATIVE NEGATIVE Final    Comment: (NOTE) Fact Sheet for Patients: EntrepreneurPulse.com.au  Fact Sheet for Healthcare Providers: IncredibleEmployment.be  This test is not yet approved or cleared by the [place] FDA and has been authorized for detection and/or diagnosis of SARS-CoV-2 by FDA under an Emergency Use Authorization (EUA). This EUA will remain in effect (meaning this test can be used) for the duration of the COVID-19 declaration under Section 564(b)(1) of the Act, 21  U.S.C. section 360bbb-3(b)(1), unless the authorization is terminated or revoked.  Performed at [facility], [address] [place], [place] [address]   Urine Culture     Status: None   Collection Time: 04/24/22 11:22 AM   Specimen: Urine, Clean Catch  Result Value Ref Range Status   Specimen Description   Final    URINE, CLEAN CATCH Performed at [facility], [address], [place], [place] [address]    Special Requests   Final    NONE Performed at [facility], [address], [place], [place] [address]    Culture   Final    NO GROWTH Performed at [facility], [place] [address] [place], [place] [address]    Report Status 04/25/2022 FINAL  Final  Urine Culture     Status: None   Collection Time: 04/28/22  8:49 PM   Specimen: Urine, Clean Catch  Result Value Ref Range Status   Specimen Description   Final    URINE, CLEAN CATCH Performed at [facility], [address], [place], [place] [address]    Special Requests   Final    NONE Performed at [facility], [address] [place], [place] [address]    Culture   Final    NO GROWTH Performed at [facility], [place] [address], [place], [place] [address]    Report Status 04/30/2022 FINAL  Final  Wet prep, genital     Status: None   Collection Time: 04/29/22  1:23 AM  Result Value Ref Range Status   Yeast Wet Prep HPF POC NONE SEEN NONE SEEN Final   Trich, Wet Prep NONE SEEN NONE SEEN Final   Clue Cells Wet Prep HPF POC NONE SEEN NONE SEEN Final   WBC, Wet Prep HPF POC <10 <10 Final   Sperm NONE SEEN  Final    Comment: Performed at [facility], [address] [place], [place] [address]     Labs: BNP (last 3 results) No results for input(s): "BNP" in the last 8760 hours. Basic Metabolic Panel: Recent Labs  Lab 04/24/22 0600 04/28/22 2049 04/29/22 0416 04/30/22 0513  NA 138 138 139 138  K 2.9* 3.7 4.5 3.8  CL 105 103 109 112*  CO2 21*  23 22 20*  GLUCOSE 103* 128* 97 86  BUN 32* 32* 31* 25*  CREATININE 3.54* 3.79* 3.50* 2.67*  CALCIUM 9.1 9.5 8.8* 8.5*   Liver Function Tests: Recent Labs  Lab 04/24/22 0600 04/28/22 2049  AST 19 20  ALT 23 14  ALKPHOS 88 82  BILITOT 0.8 0.5  PROT  7.3 7.5  ALBUMIN 3.3* 3.4*   Recent Labs  Lab 04/24/22 0600 04/28/22 2049  LIPASE 39 42   No results for input(s): "AMMONIA" in the last 168 hours. CBC: Recent Labs  Lab 04/24/22 0600 04/28/22 2049 04/29/22 0416  WBC 4.5 5.9 4.4  HGB 10.1* 10.2* 9.4*  HCT 32.3* 32.5* 30.7*  MCV 102.5* 102.2* 103.4*  PLT 244 231 183   Cardiac Enzymes: No results for input(s): "CKTOTAL", "CKMB", "CKMBINDEX", "TROPONINI" in the last 168 hours. BNP: Invalid input(s): "POCBNP" CBG: No results for input(s): "GLUCAP" in the last 168 hours. D-Dimer No results for input(s): "DDIMER" in the last 72 hours. Hgb A1c No results for input(s): "HGBA1C" in the last 72 hours. Lipid Profile No results for input(s): "CHOL", "HDL", "LDLCALC", "TRIG", "CHOLHDL", "LDLDIRECT" in the last 72 hours. Thyroid function studies No results for input(s): "TSH", "T4TOTAL", "T3FREE", "THYROIDAB" in the last 72 hours.  Invalid input(s): "FREET3" Anemia work up No results for input(s): "VITAMINB12", "FOLATE", "FERRITIN", "TIBC", "IRON", "RETICCTPCT" in the last 72 hours. Urinalysis    Component Value Date/Time   COLORURINE YELLOW (A) 04/28/2022 2049   APPEARANCEUR CLOUDY (A) 04/28/2022 2049   APPEARANCEUR Cloudy (A) 04/13/2022 1449   LABSPEC 1.011 04/28/2022 2049   LABSPEC 1.017 08/19/2014 1205   PHURINE 5.0 04/28/2022 2049   GLUCOSEU NEGATIVE 04/28/2022 2049   GLUCOSEU Negative 08/19/2014 1205   HGBUR LARGE (A) 04/28/2022 2049   BILIRUBINUR NEGATIVE 04/28/2022 2049   BILIRUBINUR Negative 04/13/2022 1449   BILIRUBINUR Negative 08/19/2014 [place] 04/28/2022 2049   PROTEINUR 100 (A) 04/28/2022 2049   UROBILINOGEN 0.2 11/15/2014 1245    NITRITE NEGATIVE 04/28/2022 2049   LEUKOCYTESUR LARGE (A) 04/28/2022 2049   LEUKOCYTESUR 1+ 08/19/2014 1205   Sepsis Labs Recent Labs  Lab 04/24/22 0600 04/28/22 2049 04/29/22 0416  WBC 4.5 5.9 4.4   Microbiology Recent Results (from the past 240 hour(s))  Resp panel by RT-PCR (RSV, Flu A&B, Covid) Anterior Nasal Swab     Status: None   Collection Time: 04/24/22  6:00 AM   Specimen: Anterior Nasal Swab  Result Value Ref Range Status   SARS Coronavirus 2 by RT PCR NEGATIVE NEGATIVE Final    Comment: (NOTE) SARS-CoV-2 target nucleic acids are NOT DETECTED.  The SARS-CoV-2 RNA is generally detectable in upper respiratory specimens during the acute phase of infection. The lowest concentration of SARS-CoV-2 viral copies this assay can detect is 138 copies/mL. A negative result does not preclude SARS-Cov-2 infection and should not be used as the sole basis for treatment or other patient management decisions. A negative result may occur with  improper specimen collection/handling, submission of specimen other than nasopharyngeal swab, presence of viral mutation(s) within the areas targeted by this assay, and inadequate number of viral copies(<138 copies/mL). A negative result must be combined with clinical observations, patient history, and epidemiological information. The expected result is Negative.  Fact Sheet for Patients:  EntrepreneurPulse.com.au  Fact Sheet for Healthcare Providers:  IncredibleEmployment.be  This test is no t yet approved or cleared by the [place] FDA and  has been authorized for detection and/or diagnosis of SARS-CoV-2 by FDA under an Emergency Use Authorization (EUA). This EUA will remain  in effect (meaning this test can be used) for the duration of the COVID-19 declaration under Section 564(b)(1) of the Act, 21 U.S.C.section 360bbb-3(b)(1), unless the authorization is terminated  or revoked sooner.        Influenza A by PCR NEGATIVE NEGATIVE Final  Influenza B by PCR NEGATIVE NEGATIVE Final    Comment: (NOTE) The Xpert Xpress SARS-CoV-2/FLU/RSV plus assay is intended as an aid in the diagnosis of influenza from Nasopharyngeal swab specimens and should not be used as a sole basis for treatment. Nasal washings and aspirates are unacceptable for Xpert Xpress SARS-CoV-2/FLU/RSV testing.  Fact Sheet for Patients: EntrepreneurPulse.com.au  Fact Sheet for Healthcare Providers: IncredibleEmployment.be  This test is not yet approved or cleared by the [place] FDA and has been authorized for detection and/or diagnosis of SARS-CoV-2 by FDA under an Emergency Use Authorization (EUA). This EUA will remain in effect (meaning this test can be used) for the duration of the COVID-19 declaration under Section 564(b)(1) of the Act, 21 U.S.C. section 360bbb-3(b)(1), unless the authorization is terminated or revoked.     Resp Syncytial Virus by PCR NEGATIVE NEGATIVE Final    Comment: (NOTE) Fact Sheet for Patients: EntrepreneurPulse.com.au  Fact Sheet for Healthcare Providers: IncredibleEmployment.be  This test is not yet approved or cleared by the [place] FDA and has been authorized for detection and/or diagnosis of SARS-CoV-2 by FDA under an Emergency Use Authorization (EUA). This EUA will remain in effect (meaning this test can be used) for the duration of the COVID-19 declaration under Section 564(b)(1) of the Act, 21 U.S.C. section 360bbb-3(b)(1), unless the authorization is terminated or revoked.  Performed at [facility], [address] [place], [place] [address]   Urine Culture     Status: None   Collection Time: 04/24/22 11:22 AM   Specimen: Urine, Clean Catch  Result Value Ref Range Status   Specimen Description   Final    URINE, CLEAN CATCH Performed at [name], [address], [place], [place] [address]    Special Requests   Final    NONE Performed at [facility], [address], [place], [place] [address]    Culture   Final    NO GROWTH Performed at [facility], [place] [address] [place], [place] [address]    Report Status 04/25/2022 FINAL  Final  Urine Culture     Status: None   Collection Time: 04/28/22  8:49 PM   Specimen: Urine, Clean Catch  Result Value Ref Range Status   Specimen Description   Final    URINE, CLEAN CATCH Performed at [facility], [address] [place], [place] [address]    Special Requests   Final    NONE Performed at [facility], [address] [place], [place] [address]    Culture   Final    NO GROWTH Performed at [facility], [place] [address], [place], [place] [address]    Report Status 04/30/2022 FINAL  Final  Wet prep, genital     Status: None   Collection Time: 04/29/22  1:23 AM  Result Value Ref Range Status   Yeast Wet Prep HPF POC NONE SEEN NONE SEEN Final   Trich, Wet Prep NONE SEEN NONE SEEN Final   Clue Cells Wet Prep HPF POC NONE SEEN NONE SEEN Final   WBC, Wet Prep HPF POC <10 <10 Final   Sperm NONE SEEN  Final    Comment: Performed at [facility], [address] [place], [place] [address]     Time coordinating discharge: Over 30 minutes  SIGNED:   Gerilynn Mccullars J [place] DO  [facility] 04/30/2022, 1:37 PM

## 2022-04-30 NOTE — TOC CM/SW Note (Addendum)
Patient is active with [facility] for PT and OT. Will follow for resumption of care at discharge.  Dayton Scrape, CSW [phone]  2:01 pm: Patient has orders to discharge home today. [facility] liaison is aware. No further concerns. CSW signing off.  Dayton Scrape, [place]

## 2022-05-01 ENCOUNTER — Encounter: Payer: Self-pay | Admitting: Urology

## 2022-05-01 ENCOUNTER — Telehealth: Payer: Self-pay

## 2022-05-01 ENCOUNTER — Ambulatory Visit (INDEPENDENT_AMBULATORY_CARE_PROVIDER_SITE_OTHER): Payer: Medicare Other | Admitting: Urology

## 2022-05-01 VITALS — BP 165/72 | HR 78 | Ht 59.0 in | Wt 176.0 lb

## 2022-05-01 DIAGNOSIS — N1330 Unspecified hydronephrosis: Secondary | ICD-10-CM

## 2022-05-01 DIAGNOSIS — R339 Retention of urine, unspecified: Secondary | ICD-10-CM | POA: Diagnosis not present

## 2022-05-01 DIAGNOSIS — N35028 Other post-traumatic urethral stricture, female: Secondary | ICD-10-CM | POA: Diagnosis not present

## 2022-05-01 DIAGNOSIS — R32 Unspecified urinary incontinence: Secondary | ICD-10-CM

## 2022-05-01 NOTE — Telephone Encounter (Signed)
Transition Care Management Unsuccessful Follow-up Telephone Call  Date of discharge and from where:  [place] 04/30/2022  Attempts:  1st Attempt  Reason for unsuccessful TCM follow-up call:  Unable to leave message Juanda Crumble, [place] Direct Dial [phone]

## 2022-05-01 NOTE — Progress Notes (Signed)
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  Notably, her postprocedural course was complicated by small bowel obstruction requiring surgical intervention felt to be related to SP tube tract.  She has had several additional admissions/treatment for UTI although her urine cultures have been negative.  She also had acute on chronic renal insufficiency recently felt to be related to Bactrim.  This was recently switched to [place] and her creatinine returned back towards its baseline.  Notably on follow-up imaging, she did have improvement in her hydro nephrosis.  Creatinine improvement has been variable.  At 1 point, she did initially passed a clamping trial of her SP tube and void spontaneously with adequate bladder emptying although started to have more difficulty with this and ultimately, the SP tube was left to gravity.  She is accompanied today by her daughter who is lots of questions about lifestyle modifications, dietary restrictions, catheter care and continence devices.   PMH: Past Medical History:  Diagnosis Date   (HFpEF) heart failure with preserved ejection fraction ([place])    a. Pt reports in 2000 she had CHF, details unclear, outside hospital; b. 06/2019 Echo: EF 50-55%, no rwma, Gr2 DD,  mildly dil LA, mild MR.   Acute left PCA stroke (HCC) 06/22/2014   Anxiety    a.) on BZO (alprazolam) PRN   CAD (coronary artery disease)    a. Pt reports in 2000 she had a stent to a coronary artery, details unclear, outside hospital.   Carotid stenosis    a. CT angio head 07/2014 - 50-60% stenoses of prox ICA bilaterally.   CKD (chronic kidney  disease), stage IV ([place])    Depression    Diabetes mellitus ([place])    Denies.   Edentulous    No upper teeth   Family hx of colon cancer 06/13/2018   Father diagnosed in his 44's   Gout    History of cataract extraction, left 03/21/2021   HOH (hard of hearing)    usually needs to read lips   Hyperlipidemia    Hypertension    Intellectual disability    a.) born premature, limited formal education, has never driven   Myalgia due to statin    Orthostatic hypotension    OSA (obstructive sleep apnea)    a.) not on nocturnal PAP therapy; old/nonfuntioning DME - referred for repeat PSG 01/2022 by neurology   Osteopenia 01/24/2018   DEXA Sept 2019   PONV (postoperative nausea and vomiting)    PTSD (post-traumatic stress disorder)        PUD (peptic ulcer disease)    PVC's (premature ventricular contractions)    Right sided weakness 05/2014   a.) s/p CVA   Seizures ([place])    Sinus bradycardia    Syncope    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)     Surgical History: Past Surgical History:  Procedure Laterality Date   BLADDER SURGERY     CATARACT EXTRACTION W/PHACO Left 03/21/2021   Procedure: CATARACT EXTRACTION PHACO AND INTRAOCULAR LENS PLACEMENT ([place]) left 5.86 00:38.0;  Surgeon: Birder Robson, MD;  Location: [facility];  Service: Ophthalmology;  Laterality: Left;  Latex   CESAREAN SECTION  CORONARY ANGIOPLASTY WITH STENT PLACEMENT     CYSTOSCOPY N/A 03/12/2022   Procedure: CYSTOSCOPY;  Surgeon: [name], MD;  Location: [facility];  Service: Urology;  Laterality: N/A;   DENTAL SURGERY     INSERTION OF SUPRAPUBIC CATHETER N/A 03/12/2022   Procedure: INSERTION OF SUPRAPUBIC CATHETER;  Surgeon: [name], MD;  Location: [facility];  Service: Urology;  Laterality: N/A;   LAPAROTOMY N/A 03/22/2022   Procedure: EXPLORATORY LAPAROTOMY; LYSIS OF ADHESIONS;  Surgeon: Benjamine Sprague, DO;  Location: [facility];  Service: General;  Laterality: N/A;    RECTAL EXAM UNDER ANESTHESIA N/A 03/12/2022   Procedure: PELVIC EXAM UNDER ANESTHESIA;  Surgeon: [name], MD;  Location: [facility];  Service: Urology;  Laterality: N/A;    Home Medications:  Allergies as of 05/01/2022       Reactions   Bactrim [sulfamethoxazole-trimethoprim] Other (See Comments)   Hematuria, dysuria   Latex Rash   Penicillin G Itching   Atorvastatin Other (See Comments)   Myalgias    Other Hives   Adhesive tape   Amlodipine Swelling   Leg swelling   Doxycycline    Blood in urine        Medication List        Accurate as of May 01, 2022  3:19 PM. If you have any questions, ask your nurse or doctor.          cefdinir 300 MG capsule Commonly known as: OMNICEF Take 1 capsule (300 mg total) by mouth 2 (two) times daily for 5 days.   citalopram 20 MG tablet Commonly known as: CELEXA TAKE 1 TABLET BY MOUTH ONCE A DAY   feeding supplement Liqd Take 237 mLs by mouth 2 (two) times daily between meals.   ferrous sulfate 325 (65 FE) MG tablet Take 325 mg by mouth daily with breakfast.   fluticasone 50 MCG/ACT nasal spray Commonly known as: FLONASE Place 2 sprays into both nostrils daily as needed for allergies (allergies).   hydrALAZINE 10 MG tablet Commonly known as: APRESOLINE Take 10 mg by mouth daily.   levETIRAcetam 1000 MG tablet Commonly known as: KEPPRA Take 1 tablet (1,000 mg total) by mouth 2 (two) times daily.   loratadine 10 MG tablet Commonly known as: CLARITIN TAKE 1 TABLET BY MOUTH ONCE A DAY AS NEEDED FOR ALLERGIES   multivitamin with minerals Tabs tablet Take 1 tablet by mouth daily.   ondansetron 4 MG disintegrating tablet Commonly known as: ZOFRAN-ODT Take 1 tablet (4 mg total) by mouth every 8 (eight) hours as needed.   pantoprazole 20 MG tablet Commonly known as: PROTONIX TAKE 1 TABLET BY MOUTH ONCE A DAY   polyethylene glycol 17 g packet Commonly known as: MIRALAX / GLYCOLAX Take 17 g by mouth daily.    tamsulosin 0.4 MG Caps capsule Commonly known as: FLOMAX Take 1 capsule (0.4 mg total) by mouth daily. Take one in the morning and one at night.   torsemide 20 MG tablet Commonly known as: DEMADEX TAKE 1 TABLET BY MOUTH ONCE DAILY   Vitamin D3 25 MCG (1000 UT) Caps Take 1 capsule by mouth daily.        Allergies:  Allergies  Allergen Reactions   Bactrim [Sulfamethoxazole-Trimethoprim] Other (See Comments)    Hematuria, dysuria   Latex Rash   Penicillin G Itching   Atorvastatin Other (See Comments)    Myalgias    Other Hives    Adhesive tape   Amlodipine Swelling    Leg swelling  Doxycycline     Blood in urine    Family History: Family History  Problem Relation Age of Onset   Stroke Mother    Heart attack Mother    Lung cancer Mother    Stroke Father    Prostate cancer Father    Colon cancer Father    Diabetes Mellitus II Daughter    Multiple sclerosis Daughter    Kidney disease Son    Heart failure Son    Cancer Paternal Grandfather     Social History:  reports that she quit smoking about 8 years ago. Her smoking use included cigarettes. She has been exposed to tobacco smoke. She has never used smokeless tobacco. She reports that she does not drink alcohol and does not use drugs.   Physical Exam: BP (!) 165/72   Pulse 78   Ht '4\' 11"'$  (1.499 m)   Wt 176 lb (79.8 kg)   BMI 35.55 kg/m   Constitutional:  Alert and oriented, No acute distress. HEENT: [place] AT, moist mucus membranes.  Trachea midline, no masses. Cardiovascular: No clubbing, cyanosis, or edema. Respiratory: Normal respiratory effort, no increased work of breathing. GI: SPT site clean dry and intact Skin: No rashes, bruises or suspicious lesions. Neurologic: Grossly intact, no focal deficits, moving all 4 extremities. Psychiatric: Normal mood and affect.  Laboratory Data: Lab Results  Component Value Date   WBC 4.4 04/29/2022   HGB 9.4 (L) 04/29/2022   HCT 30.7 (L) 04/29/2022   MCV  103.4 (H) 04/29/2022   PLT 183 04/29/2022    Lab Results  Component Value Date   CREATININE 2.67 (H) 04/30/2022   Procedure:  Previous SP tube site clean dry and intact, removed without difficulty deflating the balloon with 30 cc placed.  Area was prepped using Betadine solution.  The pannus was retracted superiorly.  A new 31 [place] council tip catheter was inserted relatively easily through the tract and upon reaching the bladder, there was drainage of light yellow urine.  Balloon was filled with 10 cc of sterile water.  I did irrigate 60 cc in and out which went easily with only some very mild mucus present.  No additional prophylactic antibiotics given today as she is currently on Ceftin [place].  Assessment & Plan:    1. Urinary retention Managed with SP tube complicated as above  Exchanged today without difficulty  Will continue q. monthly exchanges  2. Bilateral hydronephrosis Improved with placement of Foley catheter  3. 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  Incontinence Requesting prescription for incontinence diapers, has leakage from below with spasms   Follow-up 1 month for next SP tube exchange  [name], MD  [facility] [address], [place], [place] [address] [phone]  I spent 31 total minutes on the day of the encounter including pre-visit review of the medical record, face-to-face time with the patient, and post visit ordering of labs/imaging/tests.

## 2022-05-02 ENCOUNTER — Telehealth: Payer: Self-pay | Admitting: Urology

## 2022-05-02 ENCOUNTER — Telehealth: Payer: Self-pay

## 2022-05-02 ENCOUNTER — Ambulatory Visit: Payer: Self-pay | Admitting: *Deleted

## 2022-05-02 NOTE — Telephone Encounter (Signed)
Transition Care Management Unsuccessful Follow-up Telephone Call  Date of discharge and from where:  [facility] 04/30/2022  Attempts:  2nd Attempt  Reason for unsuccessful TCM follow-up call:  Voice mail full Juanda Crumble, [place] Direct Dial [phone]

## 2022-05-02 NOTE — Telephone Encounter (Signed)
  Chief Complaint: bilateral leg swelling Symptoms: leg swelling- bilateral Frequency: started yesterday  Pertinent Negatives: Patient denies pain, SOB Disposition: '[]'$ ED /'[]'$ Urgent Care (no appt availability in office) / '[x]'$ Appointment(In office/virtual)/ '[]'$  [facility]/ '[]'$ Home Care/ '[]'$ Refused Recommended Disposition /'[]'$ [facility]/ '[]'$  Follow-up with PCP Additional Notes: Patient's daughter is calling-  patient is having swelling in feet and legs- more than normal - will elevate and monitor for symptoms- if she has any changes before appointment- she will have her seen ay UC/ED

## 2022-05-02 NOTE — Telephone Encounter (Signed)
Spoke with Remo Lipps and updated demographic information sent.

## 2022-05-02 NOTE — Telephone Encounter (Signed)
Reason for Disposition  [1] MODERATE leg swelling (e.g., swelling extends up to knees) AND [2] new-onset or worsening  Answer Assessment - Initial Assessment Questions 1. ONSET: "When did the swelling start?" (e.g., minutes, hours, days)     yesterday 2. LOCATION: "What part of the leg is swollen?"  "Are both legs swollen or just one leg?"     Both legs- above the knee 3. SEVERITY: "How bad is the swelling?" (e.g., localized; mild, moderate, severe)   - Localized: Small area of swelling localized to one leg.   - MILD pedal edema: Swelling limited to foot and ankle, pitting edema < 1/4 inch (6 mm) deep, rest and elevation eliminate most or all swelling.   - MODERATE edema: Swelling of lower leg to knee, pitting edema > 1/4 inch (6 mm) deep, rest and elevation only partially reduce swelling.   - SEVERE edema: Swelling extends above knee, facial or hand swelling present.      severe 4. REDNESS: "Does the swelling look red or infected?"     Warm- knee down cold 5. PAIN: "Is the swelling painful to touch?" If Yes, ask: "How painful is it?"   (Scale 1-10; mild, moderate or severe)     no 6. FEVER: "Do you have a fever?" If Yes, ask: "What is it, how was it measured, and when did it start?"      no 7. CAUSE: "What do you think is causing the leg swelling?"     unsure 8. MEDICAL HISTORY: "Do you have a history of blood clots (e.g., DVT), cancer, heart failure, kidney disease, or liver failure?"     Heart failure,kidney disease 9. RECURRENT SYMPTOM: "Have you had leg swelling before?" If Yes, ask: "When was the last time?" "What happened that time?"     Not  this bad 10. OTHER SYMPTOMS: "Do you have any other symptoms?" (e.g., chest pain, difficulty breathing)       no  Protocols used: Leg Swelling and Edema-A-AH

## 2022-05-02 NOTE — Telephone Encounter (Signed)
Leslie Houston.  Leslie Houston from [facility] received an referral for cath supplies and he needs additional information.  Please all Leslie Houston at [phone].  Thank you

## 2022-05-02 NOTE — Telephone Encounter (Signed)
Pt's daughter LM on triage line stating that the patient is having leg and ankle swelling bilaterally. She questions if this is related to her catheter being changed yesterday.    Called pt's daughter per DPR, daughter states that pt's urine is clear and is draining appropriately into the urine bag. Advised daughter that leg edema is not related to catheter change and recommended she call the pt's PCP immediately for triage. Daughter voiced understanding.

## 2022-05-03 ENCOUNTER — Encounter: Payer: Self-pay | Admitting: Nurse Practitioner

## 2022-05-03 ENCOUNTER — Other Ambulatory Visit: Payer: Self-pay

## 2022-05-03 ENCOUNTER — Ambulatory Visit (INDEPENDENT_AMBULATORY_CARE_PROVIDER_SITE_OTHER): Payer: Medicare Other | Admitting: Nurse Practitioner

## 2022-05-03 VITALS — BP 112/74 | HR 82 | Temp 98.0°F | Resp 16 | Ht 59.0 in | Wt 187.1 lb

## 2022-05-03 DIAGNOSIS — N179 Acute kidney failure, unspecified: Secondary | ICD-10-CM

## 2022-05-03 DIAGNOSIS — M1030 Gout due to renal impairment, unspecified site: Secondary | ICD-10-CM | POA: Diagnosis not present

## 2022-05-03 DIAGNOSIS — E1122 Type 2 diabetes mellitus with diabetic chronic kidney disease: Secondary | ICD-10-CM | POA: Diagnosis not present

## 2022-05-03 DIAGNOSIS — R600 Localized edema: Secondary | ICD-10-CM | POA: Diagnosis not present

## 2022-05-03 DIAGNOSIS — I5030 Unspecified diastolic (congestive) heart failure: Secondary | ICD-10-CM | POA: Diagnosis not present

## 2022-05-03 DIAGNOSIS — N184 Chronic kidney disease, stage 4 (severe): Secondary | ICD-10-CM | POA: Diagnosis not present

## 2022-05-03 DIAGNOSIS — I110 Hypertensive heart disease with heart failure: Secondary | ICD-10-CM | POA: Diagnosis not present

## 2022-05-03 DIAGNOSIS — Z48815 Encounter for surgical aftercare following surgery on the digestive system: Secondary | ICD-10-CM | POA: Diagnosis not present

## 2022-05-03 NOTE — Assessment & Plan Note (Signed)
She is currently on torsemide 20 mg daily.  Due to worsening kidney function recommendation is to repeat labs, follow-up with nephrology, continue current meds as prescribed.  Recommend using compression socks and elevation for lower extremity edema.  Contacted nephrology office to schedule an appointment for patient.  Appointment is Jan 11 at 940am.  They also requested that we get labs including cbc, renal function panel, microalbumin urine, PTH.

## 2022-05-03 NOTE — Telephone Encounter (Signed)
Transition Care Management Follow-up Telephone Call Date of discharge and from where: [facility] 04/30/2022 How have you been since you were released from the hospital? better Any questions or concerns? No  Items Reviewed: Did the pt receive and understand the discharge instructions provided? Yes  Medications obtained and verified? Yes  Other? No  Any new allergies since your discharge? No  Dietary orders reviewed? Yes Do you have support at home? Yes   Home Care and Equipment/Supplies: Were home health services ordered? yes If so, what is the name of the agency? [facility]  Has the agency set up a time to come to the patient's home? yes Were any new equipment or medical supplies ordered?  Yes: cath  What is the name of the medical supply agency? [facility] Were you able to get the supplies/equipment? yes Do you have any questions related to the use of the equipment or supplies? No  Functional Questionnaire: (I = Independent and D = Dependent) ADLs: I  Bathing/Dressing- I  Meal Prep- I  Eating- I  Maintaining continence- I  Transferring/Ambulation- I  Managing Meds- I  Follow up appointments reviewed:  PCP Hospital f/u appt confirmed? Yes  Scheduled to see Dr Rosana Berger on 05/08/2022 @ 2:20. [facility] f/u appt confirmed? Yes  Scheduled to see Dr Candiss Norse on 05/10/2022 @ 9:40. Are transportation arrangements needed? No  If their condition worsens, is the pt aware to call PCP or go to the Emergency Dept.? Yes Was the patient provided with contact information for the PCP's office or ED? Yes Was to pt encouraged to call back with questions or concerns? Yes  Juanda Crumble, LPN [place] Direct Dial [phone]

## 2022-05-03 NOTE — Progress Notes (Signed)
BP 112/74   Pulse 82   Temp 98 F (36.7 C) (Oral)   Resp 16   Ht '4\' 11"'$  (1.499 m)   Wt 187 lb 1.6 oz (84.9 kg)   SpO2 97%   BMI 37.79 kg/m    Subjective:    Patient ID: Nakeita Styles, female    DOB: 1951/04/10, 72 y.o.   MRN: [id number]  HPI: Yousra Ivens is a 72 y.o. female  Chief Complaint  Patient presents with   Edema    Bilateral feet and leg swelling   Lower extremity edema/recent AKI: patient was recently admitted to the hospital on 04/28/2022 was discharged on 04/30/2022. She went to the er due to increased pain in her pelvic and suprapubic area over the past 24 hours.  Her daughter was concerned that her suprapubic catheter which was placed last month was not functioning correctly.  Patient's daughter stated that she noticed some dark-colored urine with sediment however patient did deny fevers or flank pain.  Patient's daughter noted small amounts of bleeding from the Cipro pubic catheter site as well as leakage of fluid around the site.  Had recently been treated for UTI diagnosed by urology with Bactrim.  ER note patient was uncomfortable appearing but in no acute distress and vital signs were unremarkable.  Did have some tenderness to palpation in her suprapubic area there is no signs of infection at the suprapubic catheter site and appeared to be draining appropriately.  ER did perform a CT abdomen pelvis results below.  Urinalysis showed WBCs, expected with her suprapubic catheter, over the ER physician was concerned of a UTI due to her recent according to the ER chart patient's urine culture Coxiella and E. coli, she had completed a course of Bactrim but the catheter was not replaced so they were concerned of ongoing infection.  Did send another urine for culture.  Labs did show worsening AKI no acute electrolyte.  Initial plan was to treat with IV fluids and to treat with IV morphine, and IV Rocephin.  Physician also stated that given the patient's AKI admission was  warranted on the hospitalist service.  During admission treatment was continued with IV fluids IV Rocephin.  Was discharged on 04/30/2022.  With instructions to follow-up with PCP, urology, continue antibiotics with cefdinir for the UTI and to get a recheck BMP in 1 week to recheck renal function.  She did see urology on 05/01/2022.  This appointment her suprapubic catheter was changed. She is here today complaining of bilateral lower extremity edema that started on Tuesday.  She denies any pain. She did elevate her legs and the swelling has improved.  There is no redness.  She is currently on torsemide 20 mg daily.  Due to worsening kidney function recommendation is to repeat labs, follow-up with nephrology, continue current meds as prescribed.  Recommend using compression socks and elevation for lower extremity edema. She is a patient of Dr Candiss Norse [facility]. Patient last seen on 08/29/2020. Contacted nephrology office to schedule an appointment for patient.  Appointment is Jan 11 at 940am.  They also requested that we get labs including cbc, renal function panel, microalbumin urine, PTH.    Ct abdomen/ pelvis: 1. Suprapubic bladder catheter with no acute complication identified. 2. Chronic renal scarring and volume loss with mild hydronephrosis, urothelial and bladder thickening unchanged. 3. Small hiatal hernia. 4. Aortic atherosclerosis. 5. Bronchitis and air trapping in the lung bases. 6. 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    Relevant past medical, surgical, family and social history reviewed and updated as indicated. Interim medical history since our last visit reviewed. Allergies and medications reviewed and updated.  Review of Systems  Constitutional: Negative for fever or weight change.  Respiratory: Negative for cough and shortness of breath.   Cardiovascular: Negative for chest pain or  palpitations.  Gastrointestinal: Negative for abdominal pain, no bowel changes.  Musculoskeletal: Negative for gait problem or joint swelling.  Lower extremities:  positive for edema Skin: Negative for rash.  Neurological: Negative for dizziness or headache.  No other specific complaints in a complete review of systems (except as listed in HPI above).      Objective:    BP 112/74   Pulse 82   Temp 98 F (36.7 C) (Oral)   Resp 16   Ht '4\' 11"'$  (1.499 m)   Wt 187 lb 1.6 oz (84.9 kg)   SpO2 97%   BMI 37.79 kg/m   Wt Readings from Last 3 Encounters:  05/03/22 187 lb 1.6 oz (84.9 kg)  05/01/22 176 lb (79.8 kg)  04/28/22 176 lb 12.9 oz (80.2 kg)    Physical Exam  Constitutional: Patient appears well-developed and well-nourished. Obese  No distress.  HEENT: head atraumatic, normocephalic, pupils equal and reactive to light, neck supple Cardiovascular: Normal rate, regular rhythm and normal heart sounds.  No murmur heard. 1+ pitting BLE edema. Pulmonary/Chest: Effort normal and breath sounds normal. No respiratory distress. Abdominal: Soft.  There is no tenderness. Psychiatric: Patient has a normal mood and affect. behavior is normal. Judgment and thought content normal.  Results for orders placed or performed during the hospital encounter of 04/28/22  Urine Culture   Specimen: Urine, Clean Catch  Result Value Ref Range   Specimen Description      URINE, CLEAN CATCH Performed at [facility], [address] [place], [place] [address]    Special Requests      NONE Performed at [facility], [address], [place], [place] [address]    Culture      NO GROWTH Performed at [facility], [place] [address], [place], [place] [address]    Report Status 04/30/2022 FINAL   Wet prep, genital  Result Value Ref Range   Yeast Wet Prep HPF POC NONE SEEN NONE SEEN   Trich, Wet Prep NONE SEEN NONE SEEN   Clue Cells Wet Prep HPF POC NONE SEEN NONE SEEN   WBC, Wet  Prep HPF POC <10 <10   Sperm NONE SEEN   Urinalysis, Routine w reflex microscopic  Result Value Ref Range   Color, Urine YELLOW (A) YELLOW   APPearance CLOUDY (A) CLEAR   Specific Gravity, Urine 1.011 1.005 - 1.030   pH 5.0 5.0 - 8.0   Glucose, UA NEGATIVE NEGATIVE mg/dL   Hgb urine dipstick LARGE (A) NEGATIVE   Bilirubin Urine NEGATIVE NEGATIVE   Ketones, ur NEGATIVE NEGATIVE mg/dL   Protein, ur 100 (A) NEGATIVE mg/dL   Nitrite NEGATIVE NEGATIVE   Leukocytes,Ua LARGE (A) NEGATIVE   RBC / HPF >50 (H) 0 - 5 RBC/hpf   WBC, UA >50 (H) 0 - 5 WBC/hpf   Bacteria, UA RARE (A) NONE SEEN   Squamous Epithelial / HPF 11-20 0 - 5 /HPF   WBC Clumps PRESENT   Basic metabolic panel  Result Value Ref Range   Sodium 138 135 - 145 mmol/L   Potassium  3.7 3.5 - 5.1 mmol/L   Chloride 103 98 - 111 mmol/L   CO2 23 22 - 32 mmol/L   Glucose, Bld 128 (H) 70 - 99 mg/dL   BUN 32 (H) 8 - 23 mg/dL   Creatinine, Ser 3.79 (H) 0.44 - 1.00 mg/dL   Calcium 9.5 8.9 - 10.3 mg/dL   GFR, Estimated 12 (L) >60 mL/min   Anion gap 12 5 - 15  CBC  Result Value Ref Range   WBC 5.9 4.0 - 10.5 K/uL   RBC 3.18 (L) 3.87 - 5.11 MIL/uL   Hemoglobin 10.2 (L) 12.0 - 15.0 g/dL   HCT 32.5 (L) 36.0 - 46.0 %   MCV 102.2 (H) 80.0 - 100.0 fL   MCH 32.1 26.0 - 34.0 pg   MCHC 31.4 30.0 - 36.0 g/dL   RDW 12.9 11.5 - 15.5 %   Platelets 231 150 - 400 K/uL   nRBC 0.0 0.0 - 0.2 %  Hepatic function panel  Result Value Ref Range   Total Protein 7.5 6.5 - 8.1 g/dL   Albumin 3.4 (L) 3.5 - 5.0 g/dL   AST 20 15 - 41 U/L   ALT 14 0 - 44 U/L   Alkaline Phosphatase 82 38 - 126 U/L   Total Bilirubin 0.5 0.3 - 1.2 mg/dL   Bilirubin, Direct 0.2 0.0 - 0.2 mg/dL   Indirect Bilirubin 0.3 0.3 - 0.9 mg/dL  Lipase, blood  Result Value Ref Range   Lipase 42 11 - 51 U/L  Basic metabolic panel  Result Value Ref Range   Sodium 139 135 - 145 mmol/L   Potassium 4.5 3.5 - 5.1 mmol/L   Chloride 109 98 - 111 mmol/L   CO2 22 22 - 32 mmol/L    Glucose, Bld 97 70 - 99 mg/dL   BUN 31 (H) 8 - 23 mg/dL   Creatinine, Ser 3.50 (H) 0.44 - 1.00 mg/dL   Calcium 8.8 (L) 8.9 - 10.3 mg/dL   GFR, Estimated 13 (L) >60 mL/min   Anion gap 8 5 - 15  CBC  Result Value Ref Range   WBC 4.4 4.0 - 10.5 K/uL   RBC 2.97 (L) 3.87 - 5.11 MIL/uL   Hemoglobin 9.4 (L) 12.0 - 15.0 g/dL   HCT 30.7 (L) 36.0 - 46.0 %   MCV 103.4 (H) 80.0 - 100.0 fL   MCH 31.6 26.0 - 34.0 pg   MCHC 30.6 30.0 - 36.0 g/dL   RDW 13.2 11.5 - 15.5 %   Platelets 183 150 - 400 K/uL   nRBC 0.0 0.0 - 0.2 %  Basic metabolic panel  Result Value Ref Range   Sodium 138 135 - 145 mmol/L   Potassium 3.8 3.5 - 5.1 mmol/L   Chloride 112 (H) 98 - 111 mmol/L   CO2 20 (L) 22 - 32 mmol/L   Glucose, Bld 86 70 - 99 mg/dL   BUN 25 (H) 8 - 23 mg/dL   Creatinine, Ser 2.67 (H) 0.44 - 1.00 mg/dL   Calcium 8.5 (L) 8.9 - 10.3 mg/dL   GFR, Estimated 18 (L) >60 mL/min   Anion gap 6 5 - 15      Assessment & Plan:   Problem List Items Addressed This Visit       Genitourinary   CKD (chronic kidney disease) stage 4, GFR 15-29 ml/min (HCC)    She is currently on torsemide 20 mg daily.  Due to worsening kidney function recommendation is to repeat labs, follow-up  with nephrology, continue current meds as prescribed.  Recommend using compression socks and elevation for lower extremity edema.  Contacted nephrology office to schedule an appointment for patient.  Appointment is Jan 11 at 940am.  They also requested that we get labs including cbc, renal function panel, microalbumin urine, PTH.        Relevant Orders   COMPLETE METABOLIC PANEL WITH GFR   Magnesium   CBC with Differential/Platelet   Renal Function Panel   Microalbumin / creatinine urine ratio   PTH, Intact and Calcium   AKI (acute kidney injury) ([place])    She is currently on torsemide 20 mg daily.  Due to worsening kidney function recommendation is to repeat labs, follow-up with nephrology, continue current meds as prescribed.   Recommend using compression socks and elevation for lower extremity edema.  Contacted nephrology office to schedule an appointment for patient.  Appointment is Jan 11 at 940am.  They also requested that we get labs including cbc, renal function panel, microalbumin urine, PTH.        Relevant Orders   COMPLETE METABOLIC PANEL WITH GFR   Magnesium   CBC with Differential/Platelet   Renal Function Panel   Microalbumin / creatinine urine ratio   PTH, Intact and Calcium     Other   Bilateral lower extremity edema - Primary    She is currently on torsemide 20 mg daily.  Due to worsening kidney function recommendation is to repeat labs, follow-up with nephrology, continue current meds as prescribed.  Recommend using compression socks and elevation for lower extremity edema.  Contacted nephrology office to schedule an appointment for patient.  Appointment is Jan 11 at 940am.  They also requested that we get labs including cbc, renal function panel, microalbumin urine, PTH.        Relevant Orders   COMPLETE METABOLIC PANEL WITH GFR   Magnesium   CBC with Differential/Platelet   Renal Function Panel   Microalbumin / creatinine urine ratio   PTH, Intact and Calcium     Follow up plan: No follow-ups on file.

## 2022-05-04 LAB — COMPLETE METABOLIC PANEL WITH GFR
AG Ratio: 1.1 (calc) (ref 1.0–2.5)
ALT: 13 U/L (ref 6–29)
AST: 17 U/L (ref 10–35)
Albumin: 3.7 g/dL (ref 3.6–5.1)
Alkaline phosphatase (APISO): 97 U/L (ref 37–153)
BUN/Creatinine Ratio: 8 (calc) (ref 6–22)
BUN: 21 mg/dL (ref 7–25)
CO2: 26 mmol/L (ref 20–32)
Calcium: 9.6 mg/dL (ref 8.6–10.4)
Chloride: 106 mmol/L (ref 98–110)
Creat: 2.75 mg/dL — ABNORMAL HIGH (ref 0.60–1.00)
Globulin: 3.3 g/dL (calc) (ref 1.9–3.7)
Glucose, Bld: 86 mg/dL (ref 65–99)
Potassium: 3.8 mmol/L (ref 3.5–5.3)
Sodium: 143 mmol/L (ref 135–146)
Total Bilirubin: 0.3 mg/dL (ref 0.2–1.2)
Total Protein: 7 g/dL (ref 6.1–8.1)
eGFR: 18 mL/min/{1.73_m2} — ABNORMAL LOW (ref >=60)

## 2022-05-04 LAB — CBC WITH DIFFERENTIAL/PLATELET
Absolute Monocytes: 292 cells/uL (ref 200–950)
Basophils Absolute: 22 cells/uL (ref 0–200)
Basophils Relative: 0.5 %
Eosinophils Absolute: 90 cells/uL (ref 15–500)
Eosinophils Relative: 2.1 %
HCT: 31.3 % — ABNORMAL LOW (ref 35.0–45.0)
Hemoglobin: 10.4 g/dL — ABNORMAL LOW (ref 11.7–15.5)
Lymphs Abs: 1294 cells/uL (ref 850–3900)
MCH: 32.8 pg (ref 27.0–33.0)
MCHC: 33.2 g/dL (ref 32.0–36.0)
MCV: 98.7 fL (ref 80.0–100.0)
MPV: 9.6 fL (ref 7.5–12.5)
Monocytes Relative: 6.8 %
Neutro Abs: 2602 cells/uL (ref 1500–7800)
Neutrophils Relative %: 60.5 %
Platelets: 224 10*3/uL (ref 140–400)
RBC: 3.17 10*6/uL — ABNORMAL LOW (ref 3.80–5.10)
RDW: 11.8 % (ref 11.0–15.0)
Total Lymphocyte: 30.1 %
WBC: 4.3 10*3/uL (ref 3.8–10.8)

## 2022-05-04 LAB — RENAL FUNCTION PANEL
Albumin: 3.7 g/dL (ref 3.6–5.1)
BUN/Creatinine Ratio: 8 (calc) (ref 6–22)
BUN: 21 mg/dL (ref 7–25)
CO2: 26 mmol/L (ref 20–32)
Calcium: 9.6 mg/dL (ref 8.6–10.4)
Chloride: 106 mmol/L (ref 98–110)
Creat: 2.75 mg/dL — ABNORMAL HIGH (ref 0.60–1.00)
Glucose, Bld: 86 mg/dL (ref 65–99)
Phosphorus: 3.5 mg/dL (ref 2.1–4.3)
Potassium: 3.8 mmol/L (ref 3.5–5.3)
Sodium: 143 mmol/L (ref 135–146)

## 2022-05-04 LAB — MICROALBUMIN / CREATININE URINE RATIO
Creatinine, Urine: 19 mg/dL — ABNORMAL LOW (ref 20–275)
Microalb Creat Ratio: 68 mcg/mg creat — ABNORMAL HIGH (ref <30)
Microalb, Ur: 1.3 mg/dL

## 2022-05-04 LAB — PTH, INTACT AND CALCIUM
Calcium: 9.6 mg/dL (ref 8.6–10.4)
PTH: 174 pg/mL — ABNORMAL HIGH (ref 16–77)

## 2022-05-04 LAB — MAGNESIUM: Magnesium: 2.2 mg/dL (ref 1.5–2.5)

## 2022-05-08 ENCOUNTER — Inpatient Hospital Stay: Payer: Medicare Other | Admitting: Internal Medicine

## 2022-05-08 DIAGNOSIS — I110 Hypertensive heart disease with heart failure: Secondary | ICD-10-CM | POA: Diagnosis not present

## 2022-05-08 DIAGNOSIS — N184 Chronic kidney disease, stage 4 (severe): Secondary | ICD-10-CM | POA: Diagnosis not present

## 2022-05-08 DIAGNOSIS — E1122 Type 2 diabetes mellitus with diabetic chronic kidney disease: Secondary | ICD-10-CM | POA: Diagnosis not present

## 2022-05-08 DIAGNOSIS — M1030 Gout due to renal impairment, unspecified site: Secondary | ICD-10-CM | POA: Diagnosis not present

## 2022-05-08 DIAGNOSIS — I5030 Unspecified diastolic (congestive) heart failure: Secondary | ICD-10-CM | POA: Diagnosis not present

## 2022-05-08 DIAGNOSIS — Z48815 Encounter for surgical aftercare following surgery on the digestive system: Secondary | ICD-10-CM | POA: Diagnosis not present

## 2022-05-08 NOTE — Progress Notes (Unsigned)
Established Patient Office Visit  Subjective:  Patient ID: Leslie Houston, female    DOB: 1950-09-24  Age: 72 y.o. MRN: [id number]  CC:  No chief complaint on file.   HPI Leslie Houston presents for hospital follow up. Accompanied by her friend [name]. She was seen here on 05/03/22 for AKI on CKD, bilateral LE swelling.   Discharge Date: 04/30/22 Diagnosis: UTI, AKI on CKD4, chronic urinary retention/hydronephrosis, compensated CHF Procedures/tests: UA in the ED with large leukocytes, rare bacteria.  CT abdomen/pelvis showing suprapubic catheter in place with no acute consult patient identified with chronic renal scarring and volume loss with mild hydronephrosis and urothelial and bladder wall thickening that was unchanged with a small hiatal hernia.  Urine culture ultimately showed no growth.  She was treated with IV Rocephin and discharged on for 7 days. Consultants: Urology,  suprapubic catheter placed inpatient and exchanged 05/01/22, Urology notes reviewed - plans for monthly changes  New medications: Cefdinir Discontinued medications: None Discharge instructions: Repeat BMP in 1 week Status: {Blank multiple:19196::"better","worse","stable","fluctuating"}   Hypertension/CHF: -Medications: Hydralazine 10 twice daily, Torsemide 20 -Patient is compliant with above medications and reports no side effects. -Checking BP at home (average): not checking recently  -Denies any SOB, CP, vision changes, LE edema or symptoms of hypotension -Following with Cardiology, Dr. Rockey Situ, last saw 02/14/21, note reviewed   Hx of CVA/CAD: -Currently on Zetia and aspirin, statin intolerance history  Lipid Panel     Component Value Date/Time   CHOL 181 12/05/2021 1358   TRIG 53 04/05/2022 0642   HDL 73 12/05/2021 1358   CHOLHDL 2.5 12/05/2021 1358   VLDL 9 11/19/2016 1141   LDLCALC 87 12/05/2021 1358   CKD4: -Last creatinine 05/03/2022 2.75, GFR 18 -Following with Nephrology,  1/11  Seizure Disorder: -Currently on Keppra 500 in the a.m., 1000 mg at night doing well, no seizure like activity  -Following with Neurology   GERD: -Complaining of acid reflux and regurgitation, especially after taking her large pills at night -Avoiding trigger foods and laying on 3 pillows at night but still having symptoms -Denies nausea, vomiting or abdominal pain.  Weight stable appetite good -Started on Protonix 40 mg, doing much better since starting medication.   MDD: -Mood status: better -Current treatment: Celexa 20 mg -Satisfied with current treatment?: yes, would like to increase -Symptom severity: severe  -Duration of current treatment : years -Side effects: no Medication compliance: excellent compliance -Had a lot of trauma in past and recent deaths in family, had also been dealing with a stressful home situation that has been resolved now.      05/03/2022    1:14 PM 12/05/2021    1:16 PM 10/10/2021    2:18 PM 10/10/2021    1:48 PM 10/10/2021    1:40 PM  Depression screen PHQ 2/9  Decreased Interest 0 '1 1 1 1  '$ Down, Depressed, Hopeless 0 '1 1 1 1  '$ PHQ - 2 Score 0 '2 2 2 2  '$ Altered sleeping 0 '2 2 2   '$ Tired, decreased energy 0 '1 2 2   '$ Change in appetite 0 '1 1 1   '$ Feeling bad or failure about yourself  0 2 0 0   Trouble concentrating 0 0 1 1   Moving slowly or fidgety/restless 0 0 1 1   Suicidal thoughts 0 0 0 0   PHQ-9 Score 0 '8 9 9   '$ Difficult doing work/chores Not difficult at all Not difficult at all Not difficult at all  Not difficult at all    Health Maintenance: -Blood work up to date, plan to repeat at follow up   Past Medical History:  Diagnosis Date   (HFpEF) heart failure with preserved ejection fraction ([place])    a. Pt reports in 2000 she had CHF, details unclear, outside hospital; b. 06/2019 Echo: EF 50-55%, no rwma, Gr2 DD,  mildly dil LA, mild MR.   Acute left PCA stroke (HCC) 06/22/2014   Anxiety    a.) on BZO (alprazolam) PRN   CAD (coronary  artery disease)    a. Pt reports in 2000 she had a stent to a coronary artery, details unclear, outside hospital.   Carotid stenosis    a. CT angio head 07/2014 - 50-60% stenoses of prox ICA bilaterally.   CKD (chronic kidney disease), stage IV ([place])    Depression    Diabetes mellitus ([place])    Denies.   Edentulous    No upper teeth   Family hx of colon cancer 06/13/2018   Father diagnosed in his 77's   Gout    History of cataract extraction, left 03/21/2021   HOH (hard of hearing)    usually needs to read lips   Hyperlipidemia    Hypertension    Intellectual disability    a.) born premature, limited formal education, has never driven   Myalgia due to statin    Orthostatic hypotension    OSA (obstructive sleep apnea)    a.) not on nocturnal PAP therapy; old/nonfuntioning DME - referred for repeat PSG 01/2022 by neurology   Osteopenia 01/24/2018   DEXA Sept 2019   PONV (postoperative nausea and vomiting)    PTSD (post-traumatic stress disorder)        PUD (peptic ulcer disease)    PVC's (premature ventricular contractions)    Right sided weakness 05/2014   a.) s/p CVA   Seizures ([place])    Sinus bradycardia    Syncope    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)     Past Surgical History:  Procedure Laterality Date   BLADDER SURGERY     CATARACT EXTRACTION W/PHACO Left 03/21/2021   Procedure: CATARACT EXTRACTION PHACO AND INTRAOCULAR LENS PLACEMENT ([place]) left 5.86 00:38.0;  Surgeon: Birder Robson, MD;  Location: [facility];  Service: Ophthalmology;  Laterality: Left;  Latex   CESAREAN SECTION     CORONARY ANGIOPLASTY WITH STENT PLACEMENT     CYSTOSCOPY N/A 03/12/2022   Procedure: CYSTOSCOPY;  Surgeon: [name], MD;  Location: [facility];  Service: Urology;  Laterality: N/A;   DENTAL SURGERY     INSERTION OF SUPRAPUBIC CATHETER N/A 03/12/2022   Procedure: INSERTION OF SUPRAPUBIC CATHETER;  Surgeon: [name], MD;  Location: [facility];   Service: Urology;  Laterality: N/A;   LAPAROTOMY N/A 03/22/2022   Procedure: EXPLORATORY LAPAROTOMY; LYSIS OF ADHESIONS;  Surgeon: Benjamine Sprague, DO;  Location: [facility];  Service: General;  Laterality: N/A;   RECTAL EXAM UNDER ANESTHESIA N/A 03/12/2022   Procedure: PELVIC EXAM UNDER ANESTHESIA;  Surgeon: [name], MD;  Location: [facility];  Service: Urology;  Laterality: N/A;    Family History  Problem Relation Age of Onset   Stroke Mother    Heart attack Mother    Lung cancer Mother    Stroke Father    Prostate cancer Father    Colon cancer Father    Diabetes Mellitus II Daughter    Multiple sclerosis Daughter    Kidney disease Son  Heart failure Son    Cancer Paternal Grandfather     Social History   Socioeconomic History   Marital status: Single    Spouse name: Not on file   Number of children: 2   Years of education: 12   Highest education level: Not on file  Occupational History   Occupation: Disabled  Tobacco Use   Smoking status: Former    Types: Cigarettes    Quit date: 03/30/2014    Years since quitting: 8.1    Passive exposure: Past   Smokeless tobacco: Never  Vaping Use   Vaping Use: Never used  Substance and Sexual Activity   Alcohol use: No   Drug use: No   Sexual activity: Not Currently  Other Topics Concern   Not on file  Social History Narrative   Lives at home with her son's father.   Right-handed.   1 cup coffee per day.   Social Determinants of Health   Financial Resource Strain: Medium Risk (10/25/2021)   Overall Financial Resource Strain (CARDIA)    Difficulty of Paying Living Expenses: Somewhat hard  Food Insecurity: Food Insecurity Present (04/30/2022)   Hunger Vital Sign    Worried About Running Out of Food in the Last Year: Never true    Ran Out of Food in the Last Year: Sometimes true  Transportation Needs: No Transportation Needs (04/30/2022)   PRAPARE - Hydrologist (Medical): No    Lack of  Transportation (Non-Medical): No  Physical Activity: Insufficiently Active (10/10/2021)   Exercise Vital Sign    Days of Exercise per Week: 3 days    Minutes of Exercise per Session: 20 min  Stress: No Stress Concern Present (10/10/2021)   [place]    Feeling of Stress : Only a little  Social Connections: Moderately Isolated (10/10/2021)   Social Connection and Isolation Panel [NHANES]    Frequency of Communication with Friends and Family: More than three times a week    Frequency of Social Gatherings with Friends and Family: Three times a week    Attends Religious Services: More than 4 times per year    Active Member of Clubs or Organizations: No    Attends Archivist Meetings: Never    Marital Status: Divorced  Human resources officer Violence: Not At Risk (04/30/2022)   Humiliation, Afraid, Rape, and Kick questionnaire    Fear of Current or Ex-Partner: No    Emotionally Abused: No    Physically Abused: No    Sexually Abused: No    Outpatient Medications Prior to Visit  Medication Sig Dispense Refill   Cholecalciferol (VITAMIN D3) 25 MCG (1000 UT) CAPS Take 1 capsule by mouth daily.     citalopram (CELEXA) 20 MG tablet TAKE 1 TABLET BY MOUTH ONCE A DAY 90 tablet 0   feeding supplement (ENSURE ENLIVE / ENSURE PLUS) LIQD Take 237 mLs by mouth 2 (two) times daily between meals. 237 mL 12   ferrous sulfate 325 (65 FE) MG tablet Take 325 mg by mouth daily with breakfast.     fluticasone (FLONASE) 50 MCG/ACT nasal spray Place 2 sprays into both nostrils daily as needed for allergies (allergies).      hydrALAZINE (APRESOLINE) 10 MG tablet Take 10 mg by mouth daily.     levETIRAcetam (KEPPRA) 1000 MG tablet Take 1 tablet (1,000 mg total) by mouth 2 (two) times daily. 180 tablet 3   loratadine (CLARITIN) 10 MG  tablet TAKE 1 TABLET BY MOUTH ONCE A DAY AS NEEDED FOR ALLERGIES 30 tablet 3   Multiple Vitamin (MULTIVITAMIN WITH  MINERALS) TABS tablet Take 1 tablet by mouth daily. 30 tablet 1   ondansetron (ZOFRAN-ODT) 4 MG disintegrating tablet Take 1 tablet (4 mg total) by mouth every 8 (eight) hours as needed. 20 tablet 0   pantoprazole (PROTONIX) 20 MG tablet TAKE 1 TABLET BY MOUTH ONCE A DAY 90 tablet 0   polyethylene glycol (MIRALAX / GLYCOLAX) 17 g packet Take 17 g by mouth daily. 14 each 0   tamsulosin (FLOMAX) 0.4 MG CAPS capsule Take 1 capsule (0.4 mg total) by mouth daily. Take one in the morning and one at night. 60 capsule 0   torsemide (DEMADEX) 20 MG tablet TAKE 1 TABLET BY MOUTH ONCE DAILY 90 tablet 1   No facility-administered medications prior to visit.    Allergies  Allergen Reactions   Bactrim [Sulfamethoxazole-Trimethoprim] Other (See Comments)    Hematuria, dysuria   Latex Rash   Penicillin G Itching   Atorvastatin Other (See Comments)    Myalgias    Other Hives    Adhesive tape   Amlodipine Swelling    Leg swelling   Doxycycline     Blood in urine    ROS Review of Systems  Constitutional:  Negative for chills and fever.  Eyes:  Negative for visual disturbance.  Respiratory:  Negative for cough and shortness of breath.   Cardiovascular:  Negative for chest pain and palpitations.  Neurological:  Negative for seizures.  Psychiatric/Behavioral:  The patient is nervous/anxious.       Objective:    Physical Exam Constitutional:      Appearance: Normal appearance.  HENT:     Head: Normocephalic and atraumatic.  Eyes:     Conjunctiva/sclera: Conjunctivae normal.  Cardiovascular:     Rate and Rhythm: Normal rate and regular rhythm.  Pulmonary:     Effort: Pulmonary effort is normal.     Breath sounds: Normal breath sounds.  Musculoskeletal:     Right lower leg: No edema.     Left lower leg: No edema.  Skin:    General: Skin is warm and dry.  Neurological:     General: No focal deficit present.     Mental Status: She is alert. Mental status is at baseline.  Psychiatric:         Mood and Affect: Mood normal.        Behavior: Behavior normal.     There were no vitals taken for this visit. Wt Readings from Last 3 Encounters:  05/03/22 187 lb 1.6 oz (84.9 kg)  05/01/22 176 lb (79.8 kg)  04/28/22 176 lb 12.9 oz (80.2 kg)     Health Maintenance Due  Topic Date Due   DTaP/Tdap/Td (1 - Tdap) Never done   Zoster Vaccines- Shingrix (1 of 2) Never done   MAMMOGRAM  02/13/2019   INFLUENZA VACCINE  11/28/2021   COVID-19 Vaccine (2 - 2023-24 season) 12/29/2021    There are no preventive care reminders to display for this patient.  Lab Results  Component Value Date   TSH 2.892 07/02/2019   Lab Results  Component Value Date   WBC 4.3 05/03/2022   HGB 10.4 (L) 05/03/2022   HCT 31.3 (L) 05/03/2022   MCV 98.7 05/03/2022   PLT 224 05/03/2022   Lab Results  Component Value Date   NA 143 05/03/2022   NA 143 05/03/2022   K 3.8  05/03/2022   K 3.8 05/03/2022   CO2 26 05/03/2022   CO2 26 05/03/2022   GLUCOSE 86 05/03/2022   GLUCOSE 86 05/03/2022   BUN 21 05/03/2022   BUN 21 05/03/2022   CREATININE 2.75 (H) 05/03/2022   CREATININE 2.75 (H) 05/03/2022   BILITOT 0.3 05/03/2022   ALKPHOS 82 04/28/2022   AST 17 05/03/2022   ALT 13 05/03/2022   PROT 7.0 05/03/2022   ALBUMIN 3.4 (L) 04/28/2022   CALCIUM 9.6 05/03/2022   CALCIUM 9.6 05/03/2022   CALCIUM 9.6 05/03/2022   ANIONGAP 6 04/30/2022   EGFR 18 (L) 05/03/2022   Lab Results  Component Value Date   CHOL 181 12/05/2021   Lab Results  Component Value Date   HDL 73 12/05/2021   Lab Results  Component Value Date   LDLCALC 87 12/05/2021   Lab Results  Component Value Date   TRIG 53 04/05/2022   Lab Results  Component Value Date   CHOLHDL 2.5 12/05/2021   Lab Results  Component Value Date   HGBA1C 4.7 12/05/2021      Assessment & Plan:   Problem List Items Addressed This Visit   None No orders of the defined types were placed in this encounter.   Follow-up: No follow-ups on  file.    Teodora Medici, DO

## 2022-05-09 ENCOUNTER — Ambulatory Visit (INDEPENDENT_AMBULATORY_CARE_PROVIDER_SITE_OTHER): Payer: Medicare Other | Admitting: Internal Medicine

## 2022-05-09 ENCOUNTER — Encounter: Payer: Self-pay | Admitting: Internal Medicine

## 2022-05-09 VITALS — BP 138/74 | HR 69 | Temp 97.7°F | Resp 18 | Ht 59.0 in | Wt 184.2 lb

## 2022-05-09 DIAGNOSIS — Z9889 Other specified postprocedural states: Secondary | ICD-10-CM | POA: Diagnosis not present

## 2022-05-09 DIAGNOSIS — N184 Chronic kidney disease, stage 4 (severe): Secondary | ICD-10-CM | POA: Diagnosis not present

## 2022-05-09 DIAGNOSIS — Z09 Encounter for follow-up examination after completed treatment for conditions other than malignant neoplasm: Secondary | ICD-10-CM

## 2022-05-10 DIAGNOSIS — Z9350 Unspecified cystostomy status: Secondary | ICD-10-CM | POA: Diagnosis not present

## 2022-05-10 DIAGNOSIS — I5030 Unspecified diastolic (congestive) heart failure: Secondary | ICD-10-CM | POA: Diagnosis not present

## 2022-05-10 DIAGNOSIS — F4310 Post-traumatic stress disorder, unspecified: Secondary | ICD-10-CM | POA: Diagnosis not present

## 2022-05-10 DIAGNOSIS — G40909 Epilepsy, unspecified, not intractable, without status epilepticus: Secondary | ICD-10-CM | POA: Diagnosis not present

## 2022-05-10 DIAGNOSIS — I2510 Atherosclerotic heart disease of native coronary artery without angina pectoris: Secondary | ICD-10-CM | POA: Diagnosis not present

## 2022-05-10 DIAGNOSIS — M1030 Gout due to renal impairment, unspecified site: Secondary | ICD-10-CM | POA: Diagnosis not present

## 2022-05-10 DIAGNOSIS — M8580 Other specified disorders of bone density and structure, unspecified site: Secondary | ICD-10-CM | POA: Diagnosis not present

## 2022-05-10 DIAGNOSIS — I110 Hypertensive heart disease with heart failure: Secondary | ICD-10-CM | POA: Diagnosis not present

## 2022-05-10 DIAGNOSIS — I69398 Other sequelae of cerebral infarction: Secondary | ICD-10-CM | POA: Diagnosis not present

## 2022-05-10 DIAGNOSIS — E785 Hyperlipidemia, unspecified: Secondary | ICD-10-CM | POA: Diagnosis not present

## 2022-05-10 DIAGNOSIS — G4733 Obstructive sleep apnea (adult) (pediatric): Secondary | ICD-10-CM | POA: Diagnosis not present

## 2022-05-10 DIAGNOSIS — F419 Anxiety disorder, unspecified: Secondary | ICD-10-CM | POA: Diagnosis not present

## 2022-05-10 DIAGNOSIS — F79 Unspecified intellectual disabilities: Secondary | ICD-10-CM | POA: Diagnosis not present

## 2022-05-10 DIAGNOSIS — Z9181 History of falling: Secondary | ICD-10-CM | POA: Diagnosis not present

## 2022-05-10 DIAGNOSIS — K279 Peptic ulcer, site unspecified, unspecified as acute or chronic, without hemorrhage or perforation: Secondary | ICD-10-CM | POA: Diagnosis not present

## 2022-05-10 DIAGNOSIS — R600 Localized edema: Secondary | ICD-10-CM | POA: Diagnosis not present

## 2022-05-10 DIAGNOSIS — I129 Hypertensive chronic kidney disease with stage 1 through stage 4 chronic kidney disease, or unspecified chronic kidney disease: Secondary | ICD-10-CM | POA: Diagnosis not present

## 2022-05-10 DIAGNOSIS — F32A Depression, unspecified: Secondary | ICD-10-CM | POA: Diagnosis not present

## 2022-05-10 DIAGNOSIS — H539 Unspecified visual disturbance: Secondary | ICD-10-CM | POA: Diagnosis not present

## 2022-05-10 DIAGNOSIS — I10 Essential (primary) hypertension: Secondary | ICD-10-CM | POA: Diagnosis not present

## 2022-05-10 DIAGNOSIS — E1122 Type 2 diabetes mellitus with diabetic chronic kidney disease: Secondary | ICD-10-CM | POA: Diagnosis not present

## 2022-05-10 DIAGNOSIS — N319 Neuromuscular dysfunction of bladder, unspecified: Secondary | ICD-10-CM | POA: Diagnosis not present

## 2022-05-10 DIAGNOSIS — Z48815 Encounter for surgical aftercare following surgery on the digestive system: Secondary | ICD-10-CM | POA: Diagnosis not present

## 2022-05-10 DIAGNOSIS — R339 Retention of urine, unspecified: Secondary | ICD-10-CM | POA: Diagnosis not present

## 2022-05-10 DIAGNOSIS — N184 Chronic kidney disease, stage 4 (severe): Secondary | ICD-10-CM | POA: Diagnosis not present

## 2022-05-10 DIAGNOSIS — H9190 Unspecified hearing loss, unspecified ear: Secondary | ICD-10-CM | POA: Diagnosis not present

## 2022-05-10 DIAGNOSIS — I6529 Occlusion and stenosis of unspecified carotid artery: Secondary | ICD-10-CM | POA: Diagnosis not present

## 2022-05-10 DIAGNOSIS — N1330 Unspecified hydronephrosis: Secondary | ICD-10-CM | POA: Diagnosis not present

## 2022-05-10 LAB — BASIC METABOLIC PANEL
BUN/Creatinine Ratio: 8 (calc) (ref 6–22)
BUN: 21 mg/dL (ref 7–25)
CO2: 25 mmol/L (ref 20–32)
Calcium: 9.3 mg/dL (ref 8.6–10.4)
Chloride: 104 mmol/L (ref 98–110)
Creat: 2.48 mg/dL — ABNORMAL HIGH (ref 0.60–1.00)
Glucose, Bld: 80 mg/dL (ref 65–99)
Potassium: 3.6 mmol/L (ref 3.5–5.3)
Sodium: 142 mmol/L (ref 135–146)

## 2022-05-10 LAB — PROTEIN / CREATININE RATIO, URINE
Creatinine, Urine: 29 mg/dL (ref 20–275)
Protein/Creat Ratio: 655 mg/g creat — ABNORMAL HIGH (ref 24–184)
Protein/Creatinine Ratio: 0.655 mg/mg creat — ABNORMAL HIGH (ref 0.024–0.184)
Total Protein, Urine: 19 mg/dL (ref 5–24)

## 2022-05-15 ENCOUNTER — Ambulatory Visit: Payer: Medicare Other | Admitting: Neurology

## 2022-05-18 DIAGNOSIS — Z48815 Encounter for surgical aftercare following surgery on the digestive system: Secondary | ICD-10-CM | POA: Diagnosis not present

## 2022-05-18 DIAGNOSIS — E1122 Type 2 diabetes mellitus with diabetic chronic kidney disease: Secondary | ICD-10-CM | POA: Diagnosis not present

## 2022-05-18 DIAGNOSIS — I5030 Unspecified diastolic (congestive) heart failure: Secondary | ICD-10-CM | POA: Diagnosis not present

## 2022-05-18 DIAGNOSIS — I110 Hypertensive heart disease with heart failure: Secondary | ICD-10-CM | POA: Diagnosis not present

## 2022-05-18 DIAGNOSIS — N184 Chronic kidney disease, stage 4 (severe): Secondary | ICD-10-CM | POA: Diagnosis not present

## 2022-05-18 DIAGNOSIS — M1030 Gout due to renal impairment, unspecified site: Secondary | ICD-10-CM | POA: Diagnosis not present

## 2022-05-25 DIAGNOSIS — I5030 Unspecified diastolic (congestive) heart failure: Secondary | ICD-10-CM | POA: Diagnosis not present

## 2022-05-25 DIAGNOSIS — N184 Chronic kidney disease, stage 4 (severe): Secondary | ICD-10-CM | POA: Diagnosis not present

## 2022-05-25 DIAGNOSIS — I110 Hypertensive heart disease with heart failure: Secondary | ICD-10-CM | POA: Diagnosis not present

## 2022-05-25 DIAGNOSIS — Z48815 Encounter for surgical aftercare following surgery on the digestive system: Secondary | ICD-10-CM | POA: Diagnosis not present

## 2022-05-25 DIAGNOSIS — E1122 Type 2 diabetes mellitus with diabetic chronic kidney disease: Secondary | ICD-10-CM | POA: Diagnosis not present

## 2022-05-25 DIAGNOSIS — M1030 Gout due to renal impairment, unspecified site: Secondary | ICD-10-CM | POA: Diagnosis not present

## 2022-05-29 ENCOUNTER — Other Ambulatory Visit: Payer: Self-pay | Admitting: Cardiovascular Disease

## 2022-05-29 DIAGNOSIS — I5030 Unspecified diastolic (congestive) heart failure: Secondary | ICD-10-CM

## 2022-06-01 ENCOUNTER — Telehealth: Payer: Self-pay | Admitting: Internal Medicine

## 2022-06-01 DIAGNOSIS — Z48815 Encounter for surgical aftercare following surgery on the digestive system: Secondary | ICD-10-CM | POA: Diagnosis not present

## 2022-06-01 DIAGNOSIS — M1030 Gout due to renal impairment, unspecified site: Secondary | ICD-10-CM | POA: Diagnosis not present

## 2022-06-01 DIAGNOSIS — E1122 Type 2 diabetes mellitus with diabetic chronic kidney disease: Secondary | ICD-10-CM | POA: Diagnosis not present

## 2022-06-01 DIAGNOSIS — I5030 Unspecified diastolic (congestive) heart failure: Secondary | ICD-10-CM | POA: Diagnosis not present

## 2022-06-01 DIAGNOSIS — N184 Chronic kidney disease, stage 4 (severe): Secondary | ICD-10-CM | POA: Diagnosis not present

## 2022-06-01 DIAGNOSIS — I110 Hypertensive heart disease with heart failure: Secondary | ICD-10-CM | POA: Diagnosis not present

## 2022-06-01 NOTE — Telephone Encounter (Signed)
Copied from [place]. Topic: General - Inquiry >> Jun 01, 2022  1:14 PM Penni Bombard wrote: Reason for CRM: pt's daughter [name] called saying [place] told her to get in touch with her primary.  The Tamisolin is written for one tablet daily but the instructions say one pill am and one pill pm.  CB'@[phone]'$ [phone]

## 2022-06-01 NOTE — Telephone Encounter (Signed)
Pt daughter instructed to call urology for those details since they are prescribing

## 2022-06-05 ENCOUNTER — Ambulatory Visit (INDEPENDENT_AMBULATORY_CARE_PROVIDER_SITE_OTHER): Payer: Medicare Other | Admitting: Urology

## 2022-06-05 DIAGNOSIS — R339 Retention of urine, unspecified: Secondary | ICD-10-CM

## 2022-06-05 MED ORDER — TAMSULOSIN HCL 0.4 MG PO CAPS: 0.4000 mg | ORAL_CAPSULE | Freq: Every day | ORAL | 5 refills | Status: DC

## 2022-06-05 MED ORDER — NYSTATIN 100000 UNIT/GM EX POWD: 1.0000 | Freq: Three times a day (TID) | CUTANEOUS | 1 refills | Status: DC

## 2022-06-06 DIAGNOSIS — N184 Chronic kidney disease, stage 4 (severe): Secondary | ICD-10-CM | POA: Diagnosis not present

## 2022-06-06 DIAGNOSIS — Z48815 Encounter for surgical aftercare following surgery on the digestive system: Secondary | ICD-10-CM | POA: Diagnosis not present

## 2022-06-06 DIAGNOSIS — M1030 Gout due to renal impairment, unspecified site: Secondary | ICD-10-CM | POA: Diagnosis not present

## 2022-06-06 DIAGNOSIS — I110 Hypertensive heart disease with heart failure: Secondary | ICD-10-CM | POA: Diagnosis not present

## 2022-06-06 DIAGNOSIS — E1122 Type 2 diabetes mellitus with diabetic chronic kidney disease: Secondary | ICD-10-CM | POA: Diagnosis not present

## 2022-06-06 DIAGNOSIS — I5030 Unspecified diastolic (congestive) heart failure: Secondary | ICD-10-CM | POA: Diagnosis not present

## 2022-06-07 ENCOUNTER — Ambulatory Visit: Payer: Medicare Other | Admitting: Internal Medicine

## 2022-06-09 DIAGNOSIS — I110 Hypertensive heart disease with heart failure: Secondary | ICD-10-CM | POA: Diagnosis not present

## 2022-06-09 DIAGNOSIS — K279 Peptic ulcer, site unspecified, unspecified as acute or chronic, without hemorrhage or perforation: Secondary | ICD-10-CM | POA: Diagnosis not present

## 2022-06-09 DIAGNOSIS — F32A Depression, unspecified: Secondary | ICD-10-CM | POA: Diagnosis not present

## 2022-06-09 DIAGNOSIS — E785 Hyperlipidemia, unspecified: Secondary | ICD-10-CM | POA: Diagnosis not present

## 2022-06-09 DIAGNOSIS — N184 Chronic kidney disease, stage 4 (severe): Secondary | ICD-10-CM | POA: Diagnosis not present

## 2022-06-09 DIAGNOSIS — H9190 Unspecified hearing loss, unspecified ear: Secondary | ICD-10-CM | POA: Diagnosis not present

## 2022-06-09 DIAGNOSIS — F79 Unspecified intellectual disabilities: Secondary | ICD-10-CM | POA: Diagnosis not present

## 2022-06-09 DIAGNOSIS — F419 Anxiety disorder, unspecified: Secondary | ICD-10-CM | POA: Diagnosis not present

## 2022-06-09 DIAGNOSIS — Z9350 Unspecified cystostomy status: Secondary | ICD-10-CM | POA: Diagnosis not present

## 2022-06-09 DIAGNOSIS — I6529 Occlusion and stenosis of unspecified carotid artery: Secondary | ICD-10-CM | POA: Diagnosis not present

## 2022-06-09 DIAGNOSIS — M8580 Other specified disorders of bone density and structure, unspecified site: Secondary | ICD-10-CM | POA: Diagnosis not present

## 2022-06-09 DIAGNOSIS — I5030 Unspecified diastolic (congestive) heart failure: Secondary | ICD-10-CM | POA: Diagnosis not present

## 2022-06-09 DIAGNOSIS — N319 Neuromuscular dysfunction of bladder, unspecified: Secondary | ICD-10-CM | POA: Diagnosis not present

## 2022-06-09 DIAGNOSIS — I2510 Atherosclerotic heart disease of native coronary artery without angina pectoris: Secondary | ICD-10-CM | POA: Diagnosis not present

## 2022-06-09 DIAGNOSIS — R339 Retention of urine, unspecified: Secondary | ICD-10-CM | POA: Diagnosis not present

## 2022-06-09 DIAGNOSIS — G40909 Epilepsy, unspecified, not intractable, without status epilepticus: Secondary | ICD-10-CM | POA: Diagnosis not present

## 2022-06-09 DIAGNOSIS — G4733 Obstructive sleep apnea (adult) (pediatric): Secondary | ICD-10-CM | POA: Diagnosis not present

## 2022-06-09 DIAGNOSIS — I69398 Other sequelae of cerebral infarction: Secondary | ICD-10-CM | POA: Diagnosis not present

## 2022-06-09 DIAGNOSIS — H539 Unspecified visual disturbance: Secondary | ICD-10-CM | POA: Diagnosis not present

## 2022-06-09 DIAGNOSIS — Z9181 History of falling: Secondary | ICD-10-CM | POA: Diagnosis not present

## 2022-06-09 DIAGNOSIS — M1030 Gout due to renal impairment, unspecified site: Secondary | ICD-10-CM | POA: Diagnosis not present

## 2022-06-09 DIAGNOSIS — N1330 Unspecified hydronephrosis: Secondary | ICD-10-CM | POA: Diagnosis not present

## 2022-06-09 DIAGNOSIS — I69351 Hemiplegia and hemiparesis following cerebral infarction affecting right dominant side: Secondary | ICD-10-CM | POA: Diagnosis not present

## 2022-06-09 DIAGNOSIS — E1122 Type 2 diabetes mellitus with diabetic chronic kidney disease: Secondary | ICD-10-CM | POA: Diagnosis not present

## 2022-06-09 DIAGNOSIS — F4310 Post-traumatic stress disorder, unspecified: Secondary | ICD-10-CM | POA: Diagnosis not present

## 2022-06-10 ENCOUNTER — Other Ambulatory Visit: Payer: Self-pay

## 2022-06-10 ENCOUNTER — Emergency Department
Admission: EM | Admit: 2022-06-10 | Discharge: 2022-06-10 | Disposition: A | Discharge status: Home / Self Care | Payer: Medicare Other | Attending: Emergency Medicine | Admitting: Emergency Medicine

## 2022-06-10 ENCOUNTER — Emergency Department: Payer: Medicare Other

## 2022-06-10 DIAGNOSIS — I130 Hypertensive heart and chronic kidney disease with heart failure and stage 1 through stage 4 chronic kidney disease, or unspecified chronic kidney disease: Secondary | ICD-10-CM | POA: Insufficient documentation

## 2022-06-10 DIAGNOSIS — I2510 Atherosclerotic heart disease of native coronary artery without angina pectoris: Secondary | ICD-10-CM | POA: Diagnosis not present

## 2022-06-10 DIAGNOSIS — Z20822 Contact with and (suspected) exposure to covid-19: Secondary | ICD-10-CM | POA: Insufficient documentation

## 2022-06-10 DIAGNOSIS — R531 Weakness: Secondary | ICD-10-CM | POA: Insufficient documentation

## 2022-06-10 DIAGNOSIS — E1122 Type 2 diabetes mellitus with diabetic chronic kidney disease: Secondary | ICD-10-CM | POA: Diagnosis not present

## 2022-06-10 DIAGNOSIS — N184 Chronic kidney disease, stage 4 (severe): Secondary | ICD-10-CM | POA: Insufficient documentation

## 2022-06-10 DIAGNOSIS — R102 Pelvic and perineal pain: Secondary | ICD-10-CM

## 2022-06-10 DIAGNOSIS — R110 Nausea: Secondary | ICD-10-CM | POA: Insufficient documentation

## 2022-06-10 DIAGNOSIS — N390 Urinary tract infection, site not specified: Secondary | ICD-10-CM | POA: Diagnosis not present

## 2022-06-10 DIAGNOSIS — Z955 Presence of coronary angioplasty implant and graft: Secondary | ICD-10-CM | POA: Insufficient documentation

## 2022-06-10 DIAGNOSIS — Z9359 Other cystostomy status: Secondary | ICD-10-CM

## 2022-06-10 DIAGNOSIS — R109 Unspecified abdominal pain: Secondary | ICD-10-CM | POA: Diagnosis not present

## 2022-06-10 DIAGNOSIS — I509 Heart failure, unspecified: Secondary | ICD-10-CM | POA: Insufficient documentation

## 2022-06-10 DIAGNOSIS — R1030 Lower abdominal pain, unspecified: Secondary | ICD-10-CM | POA: Diagnosis not present

## 2022-06-10 DIAGNOSIS — R410 Disorientation, unspecified: Secondary | ICD-10-CM | POA: Diagnosis not present

## 2022-06-10 DIAGNOSIS — R001 Bradycardia, unspecified: Secondary | ICD-10-CM | POA: Diagnosis not present

## 2022-06-10 DIAGNOSIS — R238 Other skin changes: Secondary | ICD-10-CM

## 2022-06-10 DIAGNOSIS — I700 Atherosclerosis of aorta: Secondary | ICD-10-CM | POA: Diagnosis not present

## 2022-06-10 DIAGNOSIS — N1330 Unspecified hydronephrosis: Secondary | ICD-10-CM | POA: Diagnosis not present

## 2022-06-10 LAB — RESP PANEL BY RT-PCR (RSV, FLU A&B, COVID)  RVPGX2
Influenza A by PCR: NEGATIVE
Influenza B by PCR: NEGATIVE
Resp Syncytial Virus by PCR: NEGATIVE
SARS Coronavirus 2 by RT PCR: NEGATIVE

## 2022-06-10 LAB — CBC
HCT: 38 % (ref 36.0–46.0)
Hemoglobin: 12.2 g/dL (ref 12.0–15.0)
MCH: 31.2 pg (ref 26.0–34.0)
MCHC: 32.1 g/dL (ref 30.0–36.0)
MCV: 97.2 fL (ref 80.0–100.0)
Platelets: 246 10*3/uL (ref 150–400)
RBC: 3.91 MIL/uL (ref 3.87–5.11)
RDW: 12.2 % (ref 11.5–15.5)
WBC: 3.3 10*3/uL — ABNORMAL LOW (ref 4.0–10.5)
nRBC: 0 % (ref 0.0–0.2)

## 2022-06-10 LAB — HEPATIC FUNCTION PANEL
ALT: 31 U/L (ref 0–44)
AST: 27 U/L (ref 15–41)
Albumin: 3.9 g/dL (ref 3.5–5.0)
Alkaline Phosphatase: 104 U/L (ref 38–126)
Bilirubin, Direct: <0.1 mg/dL (ref 0.0–0.2)
Total Bilirubin: 0.7 mg/dL (ref 0.3–1.2)
Total Protein: 8.3 g/dL — ABNORMAL HIGH (ref 6.5–8.1)

## 2022-06-10 LAB — BASIC METABOLIC PANEL
Anion gap: 12 (ref 5–15)
BUN: 36 mg/dL — ABNORMAL HIGH (ref 8–23)
CO2: 23 mmol/L (ref 22–32)
Calcium: 9.8 mg/dL (ref 8.9–10.3)
Chloride: 103 mmol/L (ref 98–111)
Creatinine, Ser: 2.64 mg/dL — ABNORMAL HIGH (ref 0.44–1.00)
GFR, Estimated: 19 mL/min — ABNORMAL LOW (ref >60)
Glucose, Bld: 106 mg/dL — ABNORMAL HIGH (ref 70–99)
Potassium: 3.1 mmol/L — ABNORMAL LOW (ref 3.5–5.1)
Sodium: 138 mmol/L (ref 135–145)

## 2022-06-10 LAB — LIPASE, BLOOD: Lipase: 40 U/L (ref 11–51)

## 2022-06-10 LAB — URINALYSIS, ROUTINE W REFLEX MICROSCOPIC
Bacteria, UA: NONE SEEN
Bilirubin Urine: NEGATIVE
Glucose, UA: NEGATIVE mg/dL
Ketones, ur: NEGATIVE mg/dL
Nitrite: NEGATIVE
Protein, ur: NEGATIVE mg/dL
Specific Gravity, Urine: 1.005 (ref 1.005–1.030)
pH: 6 (ref 5.0–8.0)

## 2022-06-10 MED ORDER — POTASSIUM CHLORIDE CRYS ER 20 MEQ PO TBCR
40.0000 meq | EXTENDED_RELEASE_TABLET | Freq: Once | ORAL | Status: AC
  Administered 2022-06-10: 40 meq via ORAL
  Filled 2022-06-10: qty 2

## 2022-06-10 MED ORDER — ONDANSETRON HCL 4 MG/2ML IJ SOLN
4.0000 mg | Freq: Once | INTRAMUSCULAR | Status: AC
  Administered 2022-06-10: 4 mg via INTRAVENOUS
  Filled 2022-06-10: qty 2

## 2022-06-10 MED ORDER — LACTATED RINGERS IV BOLUS
500.0000 mL | Freq: Once | INTRAVENOUS | Status: AC
  Administered 2022-06-10: 500 mL via INTRAVENOUS

## 2022-06-10 NOTE — Discharge Instructions (Addendum)
Please schedule follow-up with Dr. Erlene Quan.  Call tomorrow  Return to the ER if severe pain, fever, vomiting, weakness or other new concerns or symptoms arise.

## 2022-06-10 NOTE — ED Provider Notes (Signed)
[facility] Provider Note    Event Date/Time   First MD Initiated Contact with Patient 06/10/22 1430     (approximate)   History   No chief complaint on file.   HPI  Leslie Houston is a 72 y.o. female past medical history of HFpEF, CAD, CKD, type 2 diabetes, seizure disorder, urinary retention with chronic suprapubic cath who presents because of lower abdominal pain nausea generalized weakness.  Patient says has been feeling sick for about a week.  Her daughter says she is been staying in bed more often and did have a day of vomiting after she had the suprapubic catheter exchange on the sixth last week.  Has also had decreased p.o. intake.  Patient was able to eat dinner last night.  No vomiting in the last 24 hours.  Last bowel movement was yesterday no bowel movement today.  Patient endorses suprapubic discomfort.  This is been somewhat of a chronic issue for her since she had a suprapubic catheter placed in November 2023.  She is denying cough fevers chills shortness of breath chest pain.  Patient's daughter who is at bedside is concerned she has infection of the suprapubic catheter because there is been drainage around it onto the dressing.  Last had suprapubic catheter exchange in the office by Dr. Erlene Quan on 2/6.     Past Medical History:  Diagnosis Date   (HFpEF) heart failure with preserved ejection fraction ([place])    a. Pt reports in 2000 she had CHF, details unclear, outside hospital; b. 06/2019 Echo: EF 50-55%, no rwma, Gr2 DD,  mildly dil LA, mild MR.   Acute left PCA stroke (HCC) 06/22/2014   Anxiety    a.) on BZO (alprazolam) PRN   CAD (coronary artery disease)    a. Pt reports in 2000 she had a stent to a coronary artery, details unclear, outside hospital.   Carotid stenosis    a. CT angio head 07/2014 - 50-60% stenoses of prox ICA bilaterally.   CKD (chronic kidney disease), stage IV ([place])    Depression    Diabetes mellitus ([place])    Denies.    Edentulous    No upper teeth   Family hx of colon cancer 06/13/2018   Father diagnosed in his 18's   Gout    History of cataract extraction, left 03/21/2021   HOH (hard of hearing)    usually needs to read lips   Hyperlipidemia    Hypertension    Intellectual disability    a.) born premature, limited formal education, has never driven   Myalgia due to statin    Orthostatic hypotension    OSA (obstructive sleep apnea)    a.) not on nocturnal PAP therapy; old/nonfuntioning DME - referred for repeat PSG 01/2022 by neurology   Osteopenia 01/24/2018   DEXA Sept 2019   PONV (postoperative nausea and vomiting)    PTSD (post-traumatic stress disorder)        PUD (peptic ulcer disease)    PVC's (premature ventricular contractions)    Right sided weakness 05/2014   a.) s/p CVA   Seizures ([place])    Sinus bradycardia    Syncope    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)     Patient Active Problem List   Diagnosis Date Noted   Bilateral lower extremity edema 05/03/2022   Recurrent UTI 04/29/2022   Seizure disorder ([place]) 04/29/2022   GERD without esophagitis 04/29/2022   Hypernatremia 03/16/2022   Small  bowel obstruction ([place]) 03/14/2022   Cerebrovascular accident (CVA) ([place]) 02/15/2022   Gait abnormality 02/15/2022   Urinary retention 02/15/2022   OSA (obstructive sleep apnea) 02/15/2022   History of CVA (cerebrovascular accident) 08/24/2021   Moderate episode of recurrent major depressive disorder ([place]) 06/28/2021   Statin myopathy 03/09/2020   Lymphedema 03/09/2020   Partial symptomatic epilepsy with complex partial seizures, not intractable, without status epilepticus ([place]) 02/18/2020   Bad dreams 02/18/2020   Chronic post-traumatic stress disorder (PTSD) 02/18/2020   Snoring 02/18/2020   Coronary artery disease 02/18/2020   Stroke ([place])    Debility 07/07/2019   Hyperkalemia 07/03/2019   AKI (acute kidney injury) ([place]) 07/02/2019   Anemia of chronic  disease 07/02/2019   Bradycardia 07/02/2019   Acute CHF (congestive heart failure) ([place]) 07/02/2019   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   History of diabetes mellitus 01/22/2019   Osteopenia 01/24/2018   Anxiety 12/03/2017   Neutropenia ([place]) 12/31/2015   Seizures (HCC)    Carotid stenosis    PVC's (premature ventricular contractions)    CKD (chronic kidney disease) stage 4, GFR 15-29 ml/min ([place]) 08/19/2014   Morbid obesity ([place]) 03/04/2006   Essential hypertension 03/04/2006   GERD 03/04/2006   History of gastric ulcer 03/04/2006     Physical Exam  Triage Vital Signs: ED Triage Vitals  Enc Vitals Group     BP 06/10/22 1429 (!) 149/65     Pulse Rate 06/10/22 1429 (!) 59     Resp 06/10/22 1429 15     Temp 06/10/22 1429 97.6 F (36.4 C)     Temp Source 06/10/22 1429 Oral     SpO2 06/10/22 1429 99 %     Weight 06/10/22 1426 187 lb (84.8 kg)     Height 06/10/22 1426 5' 1"$  (1.549 m)     Head Circumference --      Peak Flow --      Pain Score --      Pain Loc --      Pain Edu? --      Excl. in [place]? --     Most recent vital signs: Vitals:   06/10/22 1429  BP: (!) 149/65  Pulse: (!) 59  Resp: 15  Temp: 97.6 F (36.4 C)  SpO2: 99%     General: Awake, no distress.  CV:  Good peripheral perfusion.  Resp:  Normal effort.  Abd:  No distention.  Midline vertical lower abdominal surgical scar, abdomen is diffusely tender specifically in the lower quadrants, no surrounding erythema or drainage around the suprapubic catheter, yellow clear urine in the bag Neuro:             Awake, Alert, Oriented x 3  Other:  Minimal pitting edema in the lower extremities   ED Results / Procedures / Treatments  Labs (all labs ordered are listed, but only abnormal results are displayed) Labs Reviewed  BASIC METABOLIC PANEL - Abnormal; Notable for the following components:      Result Value   Potassium 3.1 (*)    Glucose, Bld 106 (*)    BUN 36 (*)    Creatinine, Ser 2.64 (*)    GFR,  Estimated 19 (*)    All other components within normal limits  CBC - Abnormal; Notable for the following components:   WBC 3.3 (*)    All other components within normal limits  URINALYSIS, ROUTINE W REFLEX MICROSCOPIC - Abnormal; Notable for the following components:   Color, Urine STRAW (*)  APPearance CLEAR (*)    Hgb urine dipstick SMALL (*)    Leukocytes,Ua LARGE (*)    All other components within normal limits  RESP PANEL BY RT-PCR (RSV, FLU A&B, COVID)  [id number]  URINE CULTURE  HEPATIC FUNCTION PANEL  LIPASE, BLOOD  CBG MONITORING, ED     EKG  EKG reviewed and interpreted by myself shows sinus rhythm with a normal axis normal intervals diffuse T wave flattening no acute ischemic changes   RADIOLOGY    PROCEDURES:  Critical Care performed: No  Procedures  The patient is on the cardiac monitor to evaluate for evidence of arrhythmia and/or significant heart rate changes.   MEDICATIONS ORDERED IN ED: Medications  lactated ringers bolus 500 mL (500 mLs Intravenous New Bag/Given 06/10/22 1459)  ondansetron (ZOFRAN) injection 4 mg (4 mg Intravenous Given 06/10/22 1459)     IMPRESSION / MDM / ASSESSMENT AND PLAN / ED COURSE  I reviewed the triage vital signs and the nursing notes.                              Patient's presentation is most consistent with acute complicated illness / injury requiring diagnostic workup.  Differential diagnosis includes, but is not limited to, SBO, catheter related pain, cystitis, pyelonephritis, viral illness  Patient is a 72 year old female with chronic suprapubic catheter presents with lower abdominal pain nausea weakness concern for infection.  Patient's daughter is concerned that the suprapubic catheter is infected as there has been drainage onto the dressing.  Patient is also had some lower abdominal/suprapubic pain as well as generalized fatigue and nausea.  His daughter tells me that she had the suprapubic catheter exchanged in  the office with Dr. Erlene Quan on 2/6 and that evening developed nausea and vomiting.  Has had decreased p.o. intake since then but was able to take normal p.o. last night and has not had vomiting in the last 24 hours.  Normal bowel movement yesterday.  Has not had fevers or chills.  She is at her baseline in terms of her mental status per the daughter.  I see that patient was seen by myself in the ED on 12/26 urine culture from that time did not grow anything.  Admitted 4 days later on 12/30 for cystitis but urine culture again did not grow anything.  At that time she had AKI from Bactrim.  Patient's vitals are reassuring she is not hypotensive afebrile.  Overall exam is notable for suprapubic and lower abdominal tenderness but no obvious infection of the skin surrounding the suprapubic catheter urine looks quite clear.  Patient mentating normally.  Given her tenderness recent abdominal surgery will obtain a CT of the abdomen pelvis without contrast.  Will send urinalysis and urine culture but I am hesitant to reflexively treat based off pyuria from suprapubic cath.  Will give Zofran and small fluid bolus.  Patient's urinalysis does have 11-20 white cells negative nitrites no bacteria seen.  Urine culture has been sent but would defer starting antibiotics at this time.    Patient signed out to oncoming provider pending labs COVID test and CT of the abdomen.       FINAL CLINICAL IMPRESSION(S) / ED DIAGNOSES   Final diagnoses:  Suprapubic pain     Rx / DC Orders   ED Discharge Orders     None        Note:  This document was prepared using Dragon voice recognition  software and may include unintentional dictation errors.   Rada Hay, MD 06/10/22 [phone]

## 2022-06-10 NOTE — ED Triage Notes (Addendum)
Pt comes in for poss uti, has been confused for about a week now per family, family states they only come and see her every now and then, lives with husband who didn't give much information.   Pt has suprapubic cath in place, believes it was placed in nov, pt states it is infected, per family, not on an antibiotic  Pt believes she is here due to a seizure, states hx of seizures

## 2022-06-10 NOTE — ED Provider Notes (Signed)
CT ABDOMEN PELVIS WO CONTRAST  Result Date: 06/10/2022 CLINICAL DATA:  Abdominal pain. EXAM: CT ABDOMEN AND PELVIS WITHOUT CONTRAST TECHNIQUE: Multidetector CT imaging of the abdomen and pelvis was performed following the standard protocol without IV contrast. RADIATION DOSE REDUCTION: This exam was performed according to the departmental dose-optimization program which includes automated exposure control, adjustment of the mA and/or kV according to patient size and/or use of iterative reconstruction technique. COMPARISON:  04/29/2022. FINDINGS: Lower chest: No acute abnormality. Hepatobiliary: No focal liver abnormality is seen. No gallstones, gallbladder wall thickening, or biliary dilatation. Pancreas: Unremarkable. No pancreatic ductal dilatation or surrounding inflammatory changes. Spleen: Normal in size without focal abnormality. Adrenals/Urinary Tract: No adrenal mass. Bilateral renal cortical thinning. Mild dilation of the renal collecting systems bilaterally as well as the proximal to mid ureters. No renal or ureteral stones. No renal masses. Bladder decompressed with a suprapubic catheter. Appearance is similar to the prior CT. Stomach/Bowel: Normal stomach. Small bowel and colon are normal in caliber. No wall thickening. No inflammation. No evidence of appendicitis. Vascular/Lymphatic: Aortic atherosclerosis. No aneurysm. No enlarged lymph nodes. Reproductive: Uterus and bilateral adnexa are unremarkable. Other: No abdominal wall hernia or abnormality. No abdominopelvic ascites. Musculoskeletal: No fracture or acute finding.  No bone lesion. IMPRESSION: 1. No acute findings within the abdomen or pelvis. 2. Chronic mild bilateral hydronephrosis. Stable appearance of the indwelling suprapubic catheter, with bladder decompression. 3. Aortic atherosclerosis. Electronically Signed   By: Lajean Manes M.D.   On: 06/10/2022 16:05   DG Chest Portable 1 View  Result Date: 06/10/2022 CLINICAL DATA:   Confusion.  Possible UTI. EXAM: PORTABLE CHEST 1 VIEW COMPARISON:  04/01/2022 and older exams. FINDINGS: Cardiac silhouette mildly enlarged.  No mediastinal or hilar masses. Lungs are hyperexpanded. Prominent vascular markings, similar to the prior exam. No lung consolidation or convincing edema. No pleural effusion and no pneumothorax. Skeletal structures are grossly intact. IMPRESSION: No acute cardiopulmonary disease. Electronically Signed   By: Lajean Manes M.D.   On: 06/10/2022 15:19     CT imaging no evidence of acute abnormality.  Indwelling suprapubic catheter within the bladder.  Chronic hydronephrosis   ----------------------------------------- 4:31 PM on 06/10/2022 ----------------------------------------- Skin appears slightly irritated around suprapubic catheter site but no active purulence or erythema to suggest infection.  Patient resting comfortably no further vomiting at this time.  She reports the area around the catheter is sore.  Imaging reassuring her lab workup reassuring with baseline renal function  Recommended that she follow-up with Dr. Erlene Quan.  They actually saw Dr. Laray Anger a few days ago and are using a powder to try the area.  They are comfortable with plan for discharge and will follow-up with Dr. Erlene Quan.  I also sent an inbox message to Dr. Erlene Quan requesting follow-up with her in the next few days  Return precautions and treatment recommendations and follow-up discussed with the patient who is agreeable with the plan.    Delman Kitten, MD 06/10/22 [phone]

## 2022-06-11 ENCOUNTER — Telehealth: Payer: Self-pay

## 2022-06-11 NOTE — Progress Notes (Signed)
06/05/2022 11:18 AM   Leslie Houston 12/28/1950 [id number]  Referring provider: Teodora Medici, [address] [place],  [place] [address]  Chief Complaint  Patient presents with   Cysto    HPI: 72 year old female who presents today for suprapubic tube exchange.  Please see previous notes for details.  She has a complex history involving chronic urinary retention possibly contributing factor including female urethral stricture who underwent SP tube placement.  Postoperative course was complicated by bowel obstruction.  Over the past month, she been doing well.  She is now living independently and her daughter comes to check on her and dressing around the suprapubic area.  She is worried about some surrounding redness and moisture.  She also has a scant amount of drainage from around the tube.  No fevers or chills.   PMH: Past Medical History:  Diagnosis Date   (HFpEF) heart failure with preserved ejection fraction ([place])    a. Pt reports in 2000 she had CHF, details unclear, outside hospital; b. 06/2019 Echo: EF 50-55%, no rwma, Gr2 DD,  mildly dil LA, mild MR.   Acute left PCA stroke (HCC) 06/22/2014   Anxiety    a.) on BZO (alprazolam) PRN   CAD (coronary artery disease)    a. Pt reports in 2000 she had a stent to a coronary artery, details unclear, outside hospital.   Carotid stenosis    a. CT angio head 07/2014 - 50-60% stenoses of prox ICA bilaterally.   CKD (chronic kidney disease), stage IV ([place])    Depression    Diabetes mellitus ([place])    Denies.   Edentulous    No upper teeth   Family hx of colon cancer 06/13/2018   Father diagnosed in his 26's   Gout    History of cataract extraction, left 03/21/2021   HOH (hard of hearing)    usually needs to read lips   Hyperlipidemia    Hypertension    Intellectual disability    a.) born premature, limited formal education, has never driven   Myalgia due to statin    Orthostatic hypotension     OSA (obstructive sleep apnea)    a.) not on nocturnal PAP therapy; old/nonfuntioning DME - referred for repeat PSG 01/2022 by neurology   Osteopenia 01/24/2018   DEXA Sept 2019   PONV (postoperative nausea and vomiting)    PTSD (post-traumatic stress disorder)        PUD (peptic ulcer disease)    PVC's (premature ventricular contractions)    Right sided weakness 05/2014   a.) s/p CVA   Seizures ([place])    Sinus bradycardia    Syncope    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)     Surgical History: Past Surgical History:  Procedure Laterality Date   BLADDER SURGERY     CATARACT EXTRACTION W/PHACO Left 03/21/2021   Procedure: CATARACT EXTRACTION PHACO AND INTRAOCULAR LENS PLACEMENT ([place]) left 5.86 00:38.0;  Surgeon: Birder Robson, MD;  Location: [facility];  Service: Ophthalmology;  Laterality: Left;  Latex   CESAREAN SECTION     CORONARY ANGIOPLASTY WITH STENT PLACEMENT     CYSTOSCOPY N/A 03/12/2022   Procedure: CYSTOSCOPY;  Surgeon: [name], MD;  Location: [facility];  Service: Urology;  Laterality: N/A;   DENTAL SURGERY     INSERTION OF SUPRAPUBIC CATHETER N/A 03/12/2022   Procedure: INSERTION OF SUPRAPUBIC CATHETER;  Surgeon: [name], MD;  Location: [facility];  Service: Urology;  Laterality: N/A;  LAPAROTOMY N/A 03/22/2022   Procedure: EXPLORATORY LAPAROTOMY; LYSIS OF ADHESIONS;  Surgeon: Benjamine Sprague, DO;  Location: [facility];  Service: General;  Laterality: N/A;   RECTAL EXAM UNDER ANESTHESIA N/A 03/12/2022   Procedure: PELVIC EXAM UNDER ANESTHESIA;  Surgeon: [name], MD;  Location: [facility];  Service: Urology;  Laterality: N/A;    Home Medications:  Allergies as of 06/05/2022       Reactions   Bactrim [sulfamethoxazole-trimethoprim] Other (See Comments)   Hematuria, dysuria   Latex Rash   Penicillin G Itching   Atorvastatin Other (See Comments)   Myalgias    Other Hives   Adhesive tape   Amlodipine Swelling   Leg  swelling   Doxycycline    Blood in urine        Medication List        Accurate as of June 05, 2022 11:59 PM. If you have any questions, ask your nurse or doctor.          citalopram 20 MG tablet Commonly known as: CELEXA TAKE 1 TABLET BY MOUTH ONCE A DAY   feeding supplement Liqd Take 237 mLs by mouth 2 (two) times daily between meals.   ferrous sulfate 325 (65 FE) MG tablet Take 325 mg by mouth daily with breakfast.   fluticasone 50 MCG/ACT nasal spray Commonly known as: FLONASE Place 2 sprays into both nostrils daily as needed for allergies (allergies).   hydrALAZINE 10 MG tablet Commonly known as: APRESOLINE Take 10 mg by mouth daily.   levETIRAcetam 1000 MG tablet Commonly known as: KEPPRA Take 1 tablet (1,000 mg total) by mouth 2 (two) times daily.   loratadine 10 MG tablet Commonly known as: CLARITIN TAKE 1 TABLET BY MOUTH ONCE A DAY AS NEEDED FOR ALLERGIES   multivitamin with minerals Tabs tablet Take 1 tablet by mouth daily.   nystatin powder Commonly known as: MYCOSTATIN/NYSTOP Apply 1 Application topically 3 (three) times daily.   ondansetron 4 MG disintegrating tablet Commonly known as: ZOFRAN-ODT Take 1 tablet (4 mg total) by mouth every 8 (eight) hours as needed.   pantoprazole 20 MG tablet Commonly known as: PROTONIX TAKE 1 TABLET BY MOUTH ONCE A DAY   polyethylene glycol 17 g packet Commonly known as: MIRALAX / GLYCOLAX Take 17 g by mouth daily.   tamsulosin 0.4 MG Caps capsule Commonly known as: FLOMAX Take 1 capsule (0.4 mg total) by mouth daily. What changed: additional instructions   torsemide 20 MG tablet Commonly known as: DEMADEX TAKE 1 TABLET BY MOUTH ONCE DAILY   Vitamin D3 25 MCG (1000 UT) Caps Take 1 capsule by mouth daily.        Allergies:  Allergies  Allergen Reactions   Bactrim [Sulfamethoxazole-Trimethoprim] Other (See Comments)    Hematuria, dysuria   Latex Rash   Penicillin G Itching    Atorvastatin Other (See Comments)    Myalgias    Other Hives    Adhesive tape   Amlodipine Swelling    Leg swelling   Doxycycline     Blood in urine    Family History: Family History  Problem Relation Age of Onset   Stroke Mother    Heart attack Mother    Lung cancer Mother    Stroke Father    Prostate cancer Father    Colon cancer Father    Diabetes Mellitus II Daughter    Multiple sclerosis Daughter    Kidney disease Son    Heart failure Son    Cancer  Paternal Grandfather     Social History:  reports that she quit smoking about 8 years ago. Her smoking use included cigarettes. She has been exposed to tobacco smoke. She has never used smokeless tobacco. She reports that she does not drink alcohol and does not use drugs.   Physical Exam: Constitutional:  Alert and oriented, No acute distress. HEENT: [place] AT, moist mucus membranes.  Trachea midline, no masses. Cardiovascular: No clubbing, cyanosis, or edema. Respiratory: Normal respiratory effort, no increased work of breathing. GI: Suprapubic site clean dry and intact with some very subtle skin changes surrounding, most consistent with moisture related to change possibly candidal.  There no obvious drainage other than a scant amount of fibrinous exudate.  Tract appears to be well granulated. Skin: No rashes, bruises or suspicious lesions. Neurologic: Grossly intact, no focal deficits, moving all 4 extremities. Psychiatric: Normal mood and affect.  Laboratory Data: Lab Results  Component Value Date   WBC 3.3 (L) 06/10/2022   HGB 12.2 06/10/2022   HCT 38.0 06/10/2022   MCV 97.2 06/10/2022   PLT 246 06/10/2022    Lab Results  Component Value Date   CREATININE 2.64 (H) 06/10/2022    Lab Results  Component Value Date   HGBA1C 4.7 12/05/2021    Procedure: SP tube was removed.  The site was then cleaned using Betadine.  A new 73 French catheter was placed through the tract which went very easily.  The balloon was  filled with 10 cc of water.  I did irrigate the tube to ensure appropriate position was irrigated easily.  The dressing was replaced.  Assessment & Plan:    1. Urinary retention/chronic hydronephrosis/female urethral stricture Now managed with SP tube  Tube was exchanged today without difficulty, tract appears to be well-healed  We discussed changing the dressing as needed once becomes soiled or saturated.  No evidence of infection.  Avoid clipping hair as increases risk of infection.  She may use nystatin powder as needed around the area.  Clarified Flomax prescription, okay to take once daily and may consider starting clamping trials after next SP tube exchange.  If she does not end up passing this successfully, may completely stop Flomax. - tamsulosin (FLOMAX) 0.4 MG CAPS capsule; Take 1 capsule (0.4 mg total) by mouth daily.  Dispense: 30 capsule; Refill: 5  Follow-up in 1 month for SP tube exchange  [name], MD  [facility] [address] [place], [place] [address] [phone]

## 2022-06-11 NOTE — Telephone Encounter (Signed)
-----   Message from [name], MD sent at 06/11/2022  8:55 AM EST ----- Regarding: FW: Suprapubic Pain This lady was in the ER over the weekend with suprapubic pain but did not look like there was a real problem.  Continue have her scheduled on Sam's schedule sometime this week for ER follow-up?  [name], MD  ----- Message ----- From: Delman Kitten, MD Sent: 06/10/2022   4:30 PM EST To: [name], MD Subject: Suprapubic Pain                                Hello,  Patient seen in ER for ongoing pain and discomfort at her suprapubic catheter site.  Could you set up a follow-up with her in the next couple days?  She and her daughter are concerned about the degree of discomfort she is continuing to experience.  Urine culture sent today (no abx prescribed)  Thank you,  Elta [place]

## 2022-06-11 NOTE — Telephone Encounter (Signed)
[name], patient's daughter, advised and scheduled.

## 2022-06-12 ENCOUNTER — Ambulatory Visit (INDEPENDENT_AMBULATORY_CARE_PROVIDER_SITE_OTHER): Payer: Medicare Other | Admitting: Physician Assistant

## 2022-06-12 ENCOUNTER — Telehealth: Payer: Self-pay | Admitting: Internal Medicine

## 2022-06-12 VITALS — BP 127/65 | HR 56 | Ht 61.0 in | Wt 172.1 lb

## 2022-06-12 DIAGNOSIS — R102 Pelvic and perineal pain: Secondary | ICD-10-CM | POA: Diagnosis not present

## 2022-06-12 DIAGNOSIS — N3289 Other specified disorders of bladder: Secondary | ICD-10-CM

## 2022-06-12 MED ORDER — TAMSULOSIN HCL 0.4 MG PO CAPS: 0.4000 mg | ORAL_CAPSULE | Freq: Every day | ORAL | 5 refills | Status: DC

## 2022-06-12 NOTE — Telephone Encounter (Signed)
Copied from [place] [id number]. Topic: Quick Communication - Home Health Verbal Orders >> Jun 12, 2022 10:10 AM Marcellus Scott wrote: Caller/Agency: [facility] Number: [phone] Requesting OT/PT/Skilled Nursing/Social Work/Speech Therapy: PT Frequency: 1w4,

## 2022-06-12 NOTE — Progress Notes (Signed)
06/12/2022 4:55 PM   Leslie Houston 1950-11-25 [id number]  CC: Chief Complaint  Patient presents with   Follow-up   HPI: Leslie Houston is a 72 y.o. female with PMH chronic urinary retention and urethral stricture managed with SP tube who presents today for ED follow-up.  She is accompanied today by her daughter, Leslie Houston, who contributes to HPI.  SP tube exchanged in clinic by Dr. Erlene Quan most recently on 06/05/2022.  They report she had severe bladder/abdominal pain immediately following catheter exchange and remained in bed for several days.  Ultimately, they sought care in the emergency department 2 days ago.  CTAP without contrast was performed, with no significant findings.  UA with mild pyuria; urine culture was sent but antibiotics were deferred.  Urine culture is preliminarily growing Pseudomonas.  Today she reports her pain has progressively improved over time.  She occasionally has sensations of urinary urgency and bladder pressure but this is not terribly bothersome for her.  She denies fever.  PMH: Past Medical History:  Diagnosis Date   (HFpEF) heart failure with preserved ejection fraction ([place])    a. Pt reports in 2000 she had CHF, details unclear, outside hospital; b. 06/2019 Echo: EF 50-55%, no rwma, Gr2 DD,  mildly dil LA, mild MR.   Acute left PCA stroke (HCC) 06/22/2014   Anxiety    a.) on BZO (alprazolam) PRN   CAD (coronary artery disease)    a. Pt reports in 2000 she had a stent to a coronary artery, details unclear, outside hospital.   Carotid stenosis    a. CT angio head 07/2014 - 50-60% stenoses of prox ICA bilaterally.   CKD (chronic kidney disease), stage IV ([place])    Depression    Diabetes mellitus ([place])    Denies.   Edentulous    No upper teeth   Family hx of colon cancer 06/13/2018   Father diagnosed in his 33's   Gout    History of cataract extraction, left 03/21/2021   HOH (hard of hearing)    usually needs to read lips   Hyperlipidemia     Hypertension    Intellectual disability    a.) born premature, limited formal education, has never driven   Myalgia due to statin    Orthostatic hypotension    OSA (obstructive sleep apnea)    a.) not on nocturnal PAP therapy; old/nonfuntioning DME - referred for repeat PSG 01/2022 by neurology   Osteopenia 01/24/2018   DEXA Sept 2019   PONV (postoperative nausea and vomiting)    PTSD (post-traumatic stress disorder)        PUD (peptic ulcer disease)    PVC's (premature ventricular contractions)    Right sided weakness 05/2014   a.) s/p CVA   Seizures ([place])    Sinus bradycardia    Syncope    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)     Surgical History: Past Surgical History:  Procedure Laterality Date   BLADDER SURGERY     CATARACT EXTRACTION W/PHACO Left 03/21/2021   Procedure: CATARACT EXTRACTION PHACO AND INTRAOCULAR LENS PLACEMENT ([place]) left 5.86 00:38.0;  Surgeon: Birder Robson, MD;  Location: [place];  Service: Ophthalmology;  Laterality: Left;  Latex   CESAREAN SECTION     CORONARY ANGIOPLASTY WITH STENT PLACEMENT     CYSTOSCOPY N/A 03/12/2022   Procedure: CYSTOSCOPY;  Surgeon: [name], MD;  Location: [facility];  Service: Urology;  Laterality: N/A;   DENTAL SURGERY     INSERTION  OF SUPRAPUBIC CATHETER N/A 03/12/2022   Procedure: INSERTION OF SUPRAPUBIC CATHETER;  Surgeon: [name], MD;  Location: [facility];  Service: Urology;  Laterality: N/A;   LAPAROTOMY N/A 03/22/2022   Procedure: EXPLORATORY LAPAROTOMY; LYSIS OF ADHESIONS;  Surgeon: Benjamine Sprague, DO;  Location: [facility];  Service: General;  Laterality: N/A;   RECTAL EXAM UNDER ANESTHESIA N/A 03/12/2022   Procedure: PELVIC EXAM UNDER ANESTHESIA;  Surgeon: [name], MD;  Location: [facility];  Service: Urology;  Laterality: N/A;    Home Medications:  Allergies as of 06/12/2022       Reactions   Bactrim [sulfamethoxazole-trimethoprim] Other (See Comments)    Hematuria, dysuria   Latex Rash   Penicillin G Itching   Atorvastatin Other (See Comments)   Myalgias    Other Hives   Adhesive tape   Amlodipine Swelling   Leg swelling   Doxycycline    Blood in urine        Medication List        Accurate as of June 12, 2022  4:55 PM. If you have any questions, ask your nurse or doctor.          citalopram 20 MG tablet Commonly known as: CELEXA TAKE 1 TABLET BY MOUTH ONCE A DAY   feeding supplement Liqd Take 237 mLs by mouth 2 (two) times daily between meals.   ferrous sulfate 325 (65 FE) MG tablet Take 325 mg by mouth daily with breakfast.   fluticasone 50 MCG/ACT nasal spray Commonly known as: FLONASE Place 2 sprays into both nostrils daily as needed for allergies (allergies).   hydrALAZINE 10 MG tablet Commonly known as: APRESOLINE Take 10 mg by mouth daily.   levETIRAcetam 1000 MG tablet Commonly known as: KEPPRA Take 1 tablet (1,000 mg total) by mouth 2 (two) times daily.   loratadine 10 MG tablet Commonly known as: CLARITIN TAKE 1 TABLET BY MOUTH ONCE A DAY AS NEEDED FOR ALLERGIES   multivitamin with minerals Tabs tablet Take 1 tablet by mouth daily.   nystatin powder Commonly known as: MYCOSTATIN/NYSTOP Apply 1 Application topically 3 (three) times daily.   ondansetron 4 MG disintegrating tablet Commonly known as: ZOFRAN-ODT Take 1 tablet (4 mg total) by mouth every 8 (eight) hours as needed.   pantoprazole 20 MG tablet Commonly known as: PROTONIX TAKE 1 TABLET BY MOUTH ONCE A DAY   polyethylene glycol 17 g packet Commonly known as: MIRALAX / GLYCOLAX Take 17 g by mouth daily.   tamsulosin 0.4 MG Caps capsule Commonly known as: FLOMAX Take 1 capsule (0.4 mg total) by mouth daily.   torsemide 20 MG tablet Commonly known as: DEMADEX TAKE 1 TABLET BY MOUTH ONCE DAILY   Vitamin D3 25 MCG (1000 UT) capsule Generic drug: Cholecalciferol Take 1 capsule by mouth daily.        Allergies:   Allergies  Allergen Reactions   Bactrim [Sulfamethoxazole-Trimethoprim] Other (See Comments)    Hematuria, dysuria   Latex Rash   Penicillin G Itching   Atorvastatin Other (See Comments)    Myalgias    Other Hives    Adhesive tape   Amlodipine Swelling    Leg swelling   Doxycycline     Blood in urine    Family History: Family History  Problem Relation Age of Onset   Stroke Mother    Heart attack Mother    Lung cancer Mother    Stroke Father    Prostate cancer Father    Colon cancer Father  Diabetes Mellitus II Daughter    Multiple sclerosis Daughter    Kidney disease Son    Heart failure Son    Cancer Paternal Grandfather     Social History:   reports that she quit smoking about 8 years ago. Her smoking use included cigarettes. She has been exposed to tobacco smoke. She has never used smokeless tobacco. She reports that she does not drink alcohol and does not use drugs.  Physical Exam: BP 127/65   Pulse (!) 56   Ht 5' 1"$  (1.549 m)   Wt 172 lb 1.6 oz (78.1 kg)   BMI 32.52 kg/m   Constitutional:  Alert and oriented, no acute distress, nontoxic appearing HEENT: [place], AT Cardiovascular: No clubbing, cyanosis, or edema Respiratory: Normal respiratory effort, no increased work of breathing GI: Abdomen is soft, nontender, nondistended, no rigidity, rebound, or guarding GU: SP tube in place draining clear, yellow urine. Skin: No rashes, bruises or suspicious lesions Neurologic: Grossly intact, no focal deficits, moving all 4 extremities Psychiatric: Normal mood and affect  Assessment & Plan:   1. Suprapubic pain Resolving.  Her recent urine culture is growing Pseudomonas, likely colonization in the setting of SP tube.  In shared decision making we elected to defer further pharmacotherapy for now.  We discussed return precautions including fever, worsening pain, or gross hematuria.  At that point, will treat with culture appropriate antibiotics.  2. Bladder  spasms Minimally bothersome.  We discussed that she may benefit from OAB meds in the future for management of these, however I do not recommend starting them yet if the plan is to attempt a clamping trial again later this year.  If she fails this, may start pharmacotherapy in the future.  Return if symptoms worsen or fail to improve.  Debroah Loop, PA-C  [facility] [address] [place], [place] [address] [phone]

## 2022-06-13 LAB — URINE CULTURE: Culture: >=100000 — AB

## 2022-06-20 DIAGNOSIS — I69398 Other sequelae of cerebral infarction: Secondary | ICD-10-CM | POA: Diagnosis not present

## 2022-06-20 DIAGNOSIS — E1122 Type 2 diabetes mellitus with diabetic chronic kidney disease: Secondary | ICD-10-CM | POA: Diagnosis not present

## 2022-06-20 DIAGNOSIS — H9190 Unspecified hearing loss, unspecified ear: Secondary | ICD-10-CM | POA: Diagnosis not present

## 2022-06-20 DIAGNOSIS — I69351 Hemiplegia and hemiparesis following cerebral infarction affecting right dominant side: Secondary | ICD-10-CM | POA: Diagnosis not present

## 2022-06-20 DIAGNOSIS — I110 Hypertensive heart disease with heart failure: Secondary | ICD-10-CM | POA: Diagnosis not present

## 2022-06-20 DIAGNOSIS — I5030 Unspecified diastolic (congestive) heart failure: Secondary | ICD-10-CM | POA: Diagnosis not present

## 2022-06-27 DIAGNOSIS — I110 Hypertensive heart disease with heart failure: Secondary | ICD-10-CM | POA: Diagnosis not present

## 2022-06-27 DIAGNOSIS — H9190 Unspecified hearing loss, unspecified ear: Secondary | ICD-10-CM | POA: Diagnosis not present

## 2022-06-27 DIAGNOSIS — I69398 Other sequelae of cerebral infarction: Secondary | ICD-10-CM | POA: Diagnosis not present

## 2022-06-27 DIAGNOSIS — I69351 Hemiplegia and hemiparesis following cerebral infarction affecting right dominant side: Secondary | ICD-10-CM | POA: Diagnosis not present

## 2022-06-27 DIAGNOSIS — I5030 Unspecified diastolic (congestive) heart failure: Secondary | ICD-10-CM | POA: Diagnosis not present

## 2022-06-27 DIAGNOSIS — E1122 Type 2 diabetes mellitus with diabetic chronic kidney disease: Secondary | ICD-10-CM | POA: Diagnosis not present

## 2022-07-01 NOTE — Progress Notes (Unsigned)
Established Patient Office Visit  Subjective:  Patient ID: Leslie Houston, female    DOB: [birth year]  Age: 72 y.o. MRN: [id number]  CC:  No chief complaint on file.   HPI Leslie Houston presents for follow up on chronic medical conditions. Accompanied by her friend [name].   Hypertension/CHF: -Medications: Hydralazine 10 twice daily (did get a refill from Nephrology for once a day and uncertain which dose to do, currently only taking it once a day), Torsemide 20 mg -Patient is compliant with above medications and reports no side effects. -Checking BP at home (average): averaging 120/80, no lower readings -Denies any SOB, CP, vision changes, LE edema or symptoms of hypotension -Following with Cardiology, Dr. Rockey Situ, last saw 02/14/21, note reviewed.  Hx of CVA/CAD: -Currently on Zetia and aspirin, statin intolerance history  Lipid Panel     Component Value Date/Time   CHOL 181 12/05/2021 1358   TRIG 53 04/05/2022 0642   HDL 73 12/05/2021 1358   CHOLHDL 2.5 12/05/2021 1358   VLDL 9 11/19/2016 1141   LDLCALC 87 12/05/2021 1358   CKD4: -Last creatinine 8/22 2.95, GFR 16 -Following with Nephrology, last seen 08/29/20 but planning to see again soon this month.   Seizure Disorder: -Currently on Keppra 500 in the a.m., 1000 mg at night doing well, no seizure like activity  -Following with Neurology, planning on follow up in the next 1-2 months   GERD: -Currently on Protonix 20 mg, had been on 40 mg and titrated down, doing really well on this dose, symptoms well controlled.  -Avoiding trigger foods and laying on 3 pillows at night  -Denies nausea, vomiting or abdominal pain.  Weight stable appetite good  MDD: -Mood status: better -Current treatment: Celexa 20 mg (increased at LOV) but was confused about how to take the medication, had been taking it only as needed instead of daily. Now she is taking it daily and is doing really well, would like to stay on this  current dose.   -Satisfied with current treatment?: yes -Symptom severity: severe  -Duration of current treatment :  back on this dose for 6 weeks  -Side effects: no Medication compliance: excellent compliance -Had a lot of trauma in past and recent deaths in family, had also been dealing with a stressful home situation that has been resolved now.      05/09/2022   11:12 AM 05/03/2022    1:14 PM 12/05/2021    1:16 PM 10/10/2021    2:18 PM 10/10/2021    1:48 PM  Depression screen PHQ 2/9  Decreased Interest 0 0 '1 1 1  '$ Down, Depressed, Hopeless 1 0 '1 1 1  '$ PHQ - 2 Score 1 0 '2 2 2  '$ Altered sleeping 0 0 '2 2 2  '$ Tired, decreased energy 0 0 '1 2 2  '$ Change in appetite 0 0 '1 1 1  '$ Feeling bad or failure about yourself  0 0 2 0 0  Trouble concentrating 0 0 0 1 1  Moving slowly or fidgety/restless 0 0 0 1 1  Suicidal thoughts 0 0 0 0 0  PHQ-9 Score 1 0 '8 9 9  '$ Difficult doing work/chores Not difficult at all Not difficult at all Not difficult at all Not difficult at all Not difficult at all   Health Maintenance: -Blood work due -Mammogram to be scheduled in the next month   Past Medical History:  Diagnosis Date   (HFpEF) heart failure with preserved ejection fraction ([place])  a. Pt reports in 2000 she had CHF, details unclear, outside hospital; b. 06/2019 Echo: EF 50-55%, no rwma, Gr2 DD,  mildly dil LA, mild MR.   Acute left PCA stroke (HCC) 06/22/2014   Anxiety    a.) on BZO (alprazolam) PRN   CAD (coronary artery disease)    a. Pt reports in 2000 she had a stent to a coronary artery, details unclear, outside hospital.   Carotid stenosis    a. CT angio head 07/2014 - 50-60% stenoses of prox ICA bilaterally.   CKD (chronic kidney disease), stage IV ([place])    Depression    Diabetes mellitus ([place])    Denies.   Edentulous    No upper teeth   Family hx of colon cancer 06/13/2018   Father diagnosed in his 18's   Gout    History of cataract extraction, left 03/21/2021   HOH (hard of hearing)     usually needs to read lips   Hyperlipidemia    Hypertension    Intellectual disability    a.) born premature, limited formal education, has never driven   Myalgia due to statin    Orthostatic hypotension    OSA (obstructive sleep apnea)    a.) not on nocturnal PAP therapy; old/nonfuntioning DME - referred for repeat PSG 01/2022 by neurology   Osteopenia 01/24/2018   DEXA Sept 2019   PONV (postoperative nausea and vomiting)    PTSD (post-traumatic stress disorder)        PUD (peptic ulcer disease)    PVC's (premature ventricular contractions)    Right sided weakness 05/2014   a.) s/p CVA   Seizures ([place])    Sinus bradycardia    Syncope    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)     Past Surgical History:  Procedure Laterality Date   BLADDER SURGERY     CATARACT EXTRACTION W/PHACO Left 03/21/2021   Procedure: CATARACT EXTRACTION PHACO AND INTRAOCULAR LENS PLACEMENT ([place]) left 5.86 00:38.0;  Surgeon: Birder Robson, MD;  Location: [facility];  Service: Ophthalmology;  Laterality: Left;  Latex   CESAREAN SECTION     CORONARY ANGIOPLASTY WITH STENT PLACEMENT     CYSTOSCOPY N/A 03/12/2022   Procedure: CYSTOSCOPY;  Surgeon: [name], MD;  Location: [facility];  Service: Urology;  Laterality: N/A;   DENTAL SURGERY     INSERTION OF SUPRAPUBIC CATHETER N/A 03/12/2022   Procedure: INSERTION OF SUPRAPUBIC CATHETER;  Surgeon: [name], MD;  Location: [facility];  Service: Urology;  Laterality: N/A;   LAPAROTOMY N/A 03/22/2022   Procedure: EXPLORATORY LAPAROTOMY; LYSIS OF ADHESIONS;  Surgeon: Benjamine Sprague, DO;  Location: [facility];  Service: General;  Laterality: N/A;   RECTAL EXAM UNDER ANESTHESIA N/A 03/12/2022   Procedure: PELVIC EXAM UNDER ANESTHESIA;  Surgeon: [name], MD;  Location: [facility];  Service: Urology;  Laterality: N/A;    Family History  Problem Relation Age of Onset   Stroke Mother    Heart attack Mother    Lung cancer  Mother    Stroke Father    Prostate cancer Father    Colon cancer Father    Diabetes Mellitus II Daughter    Multiple sclerosis Daughter    Kidney disease Son    Heart failure Son    Cancer Paternal Grandfather     Social History   Socioeconomic History   Marital status: Single    Spouse name: Not on file   Number of children: 2   Years  of education: 12   Highest education level: Not on file  Occupational History   Occupation: Disabled  Tobacco Use   Smoking status: Former    Types: Cigarettes    Quit date: 03/30/2014    Years since quitting: 8.2    Passive exposure: Past   Smokeless tobacco: Never  Vaping Use   Vaping Use: Never used  Substance and Sexual Activity   Alcohol use: No   Drug use: No   Sexual activity: Not Currently  Other Topics Concern   Not on file  Social History Narrative   Lives at home with her son's father.   Right-handed.   1 cup coffee per day.   Social Determinants of Health   Financial Resource Strain: Medium Risk (10/25/2021)   Overall Financial Resource Strain (CARDIA)    Difficulty of Paying Living Expenses: Somewhat hard  Food Insecurity: Food Insecurity Present (04/30/2022)   Hunger Vital Sign    Worried About Running Out of Food in the Last Year: Never true    Ran Out of Food in the Last Year: Sometimes true  Transportation Needs: No Transportation Needs (04/30/2022)   PRAPARE - Hydrologist (Medical): No    Lack of Transportation (Non-Medical): No  Physical Activity: Insufficiently Active (10/10/2021)   Exercise Vital Sign    Days of Exercise per Week: 3 days    Minutes of Exercise per Session: 20 min  Stress: No Stress Concern Present (10/10/2021)   [place]    Feeling of Stress : Only a little  Social Connections: Moderately Isolated (10/10/2021)   Social Connection and Isolation Panel [NHANES]    Frequency of Communication with  Friends and Family: More than three times a week    Frequency of Social Gatherings with Friends and Family: Three times a week    Attends Religious Services: More than 4 times per year    Active Member of Clubs or Organizations: No    Attends Archivist Meetings: Never    Marital Status: Divorced  Human resources officer Violence: Not At Risk (04/30/2022)   Humiliation, Afraid, Rape, and Kick questionnaire    Fear of Current or Ex-Partner: No    Emotionally Abused: No    Physically Abused: No    Sexually Abused: No    Outpatient Medications Prior to Visit  Medication Sig Dispense Refill   Cholecalciferol (VITAMIN D3) 25 MCG (1000 UT) CAPS Take 1 capsule by mouth daily.     citalopram (CELEXA) 20 MG tablet TAKE 1 TABLET BY MOUTH ONCE A DAY 90 tablet 0   feeding supplement (ENSURE ENLIVE / ENSURE PLUS) LIQD Take 237 mLs by mouth 2 (two) times daily between meals. 237 mL 12   ferrous sulfate 325 (65 FE) MG tablet Take 325 mg by mouth daily with breakfast.     fluticasone (FLONASE) 50 MCG/ACT nasal spray Place 2 sprays into both nostrils daily as needed for allergies (allergies).      hydrALAZINE (APRESOLINE) 10 MG tablet Take 10 mg by mouth daily.     levETIRAcetam (KEPPRA) 1000 MG tablet Take 1 tablet (1,000 mg total) by mouth 2 (two) times daily. 180 tablet 3   loratadine (CLARITIN) 10 MG tablet TAKE 1 TABLET BY MOUTH ONCE A DAY AS NEEDED FOR ALLERGIES 30 tablet 3   Multiple Vitamin (MULTIVITAMIN WITH MINERALS) TABS tablet Take 1 tablet by mouth daily. 30 tablet 1   nystatin (MYCOSTATIN/NYSTOP) powder Apply 1  Application topically 3 (three) times daily. 60 g 1   ondansetron (ZOFRAN-ODT) 4 MG disintegrating tablet Take 1 tablet (4 mg total) by mouth every 8 (eight) hours as needed. 20 tablet 0   pantoprazole (PROTONIX) 20 MG tablet TAKE 1 TABLET BY MOUTH ONCE A DAY 90 tablet 0   polyethylene glycol (MIRALAX / GLYCOLAX) 17 g packet Take 17 g by mouth daily. 14 each 0   tamsulosin (FLOMAX)  0.4 MG CAPS capsule Take 1 capsule (0.4 mg total) by mouth daily. 30 capsule 5   torsemide (DEMADEX) 20 MG tablet TAKE 1 TABLET BY MOUTH ONCE DAILY 90 tablet 1   No facility-administered medications prior to visit.    Allergies  Allergen Reactions   Bactrim [Sulfamethoxazole-Trimethoprim] Other (See Comments)    Hematuria, dysuria   Latex Rash   Penicillin G Itching   Atorvastatin Other (See Comments)    Myalgias    Other Hives    Adhesive tape   Amlodipine Swelling    Leg swelling   Doxycycline     Blood in urine    ROS Review of Systems  Constitutional:  Negative for chills and fever.  HENT:  Positive for ear pain and hearing loss. Negative for congestion, ear discharge and tinnitus.   Eyes:  Negative for visual disturbance.  Respiratory:  Negative for cough and shortness of breath.   Cardiovascular:  Negative for chest pain and palpitations.  Gastrointestinal:  Negative for abdominal pain, nausea and vomiting.  Neurological:  Negative for seizures.      Objective:    Physical Exam Constitutional:      Appearance: Normal appearance.  HENT:     Head: Normocephalic and atraumatic.     Right Ear: Tympanic membrane, ear canal and external ear normal.     Left Ear: Tympanic membrane, ear canal and external ear normal.  Eyes:     Conjunctiva/sclera: Conjunctivae normal.  Cardiovascular:     Rate and Rhythm: Normal rate and regular rhythm.  Pulmonary:     Effort: Pulmonary effort is normal.     Breath sounds: Normal breath sounds.  Musculoskeletal:     Right lower leg: No edema.     Left lower leg: No edema.  Skin:    General: Skin is warm and dry.  Neurological:     General: No focal deficit present.     Mental Status: She is alert. Mental status is at baseline.  Psychiatric:        Mood and Affect: Mood normal.        Behavior: Behavior normal.     There were no vitals taken for this visit. Wt Readings from Last 3 Encounters:  06/12/22 172 lb 1.6 oz  (78.1 kg)  06/10/22 187 lb (84.8 kg)  05/09/22 184 lb 3.2 oz (83.6 kg)     Health Maintenance Due  Topic Date Due   DTaP/Tdap/Td (1 - Tdap) Never done   Fecal DNA (Cologuard)  Never done   Zoster Vaccines- Shingrix (1 of 2) Never done   MAMMOGRAM  02/13/2019   INFLUENZA VACCINE  11/28/2021   COVID-19 Vaccine (2 - 2023-24 season) 12/29/2021    There are no preventive care reminders to display for this patient.  Lab Results  Component Value Date   TSH 2.892 07/02/2019   Lab Results  Component Value Date   WBC 3.3 (L) 06/10/2022   HGB 12.2 06/10/2022   HCT 38.0 06/10/2022   MCV 97.2 06/10/2022   PLT 246 06/10/2022  Lab Results  Component Value Date   NA 138 06/10/2022   K 3.1 (L) 06/10/2022   CO2 23 06/10/2022   GLUCOSE 106 (H) 06/10/2022   BUN 36 (H) 06/10/2022   CREATININE 2.64 (H) 06/10/2022   BILITOT 0.7 06/10/2022   ALKPHOS 104 06/10/2022   AST 27 06/10/2022   ALT 31 06/10/2022   PROT 8.3 (H) 06/10/2022   ALBUMIN 3.9 06/10/2022   CALCIUM 9.8 06/10/2022   ANIONGAP 12 06/10/2022   EGFR 18 (L) 05/03/2022   Lab Results  Component Value Date   CHOL 181 12/05/2021   Lab Results  Component Value Date   HDL 73 12/05/2021   Lab Results  Component Value Date   LDLCALC 87 12/05/2021   Lab Results  Component Value Date   TRIG 53 04/05/2022   Lab Results  Component Value Date   CHOLHDL 2.5 12/05/2021   Lab Results  Component Value Date   HGBA1C 4.7 12/05/2021      Assessment & Plan:   1. Dysfunction of both eustachian tubes: Given sample of Ryaltris nasal spray to use 2 sprays on each side twice a day, recommend over the counter nasal spray to help avoid nasal mucosal dryness.   2. Moderate episode of recurrent major depressive disorder [facility]): Doing well, continue Celexa 20 mg daily.   3. Essential hypertension: Blood pressure good today, continue Torsemide 20 mg, Hydralazine 10 mg daily. Continue to monitor blood pressure. Due for yearly labs.    - CBC w/Diff/Platelet - COMPLETE METABOLIC PANEL WITH GFR  4. Coronary artery disease involving native coronary artery of native heart without angina pectoris: Recheck cholesterol today. Continue Zetia 10 mg daily.   - Lipid Profile  5. CKD (chronic kidney disease), stage IV ([place]): Recheck kidney function, seeing Nephrology soon.   - COMPLETE METABOLIC PANEL WITH GFR  6. Gastroesophageal reflux disease, unspecified whether esophagitis present: Stable, continue Protonix 20 mg daily.   7. History of diabetes mellitus: Diagnosis in chart of history of diabetes, but all A1c's in the past 7 years normal. Will recheck today.   - HgB A1c  8. Iron deficiency anemia, unspecified iron deficiency anemia type: Check iron today, had been taking over the counter supplements but ran out and has not been taking them lately.   - Fe+TIBC+Fer  9. Vitamin D deficiency: Recheck Vitamin D, currently on Vitamin D 1000 IU daily.   - Vitamin D (25 hydroxy)  Follow-up: No follow-ups on file.    Teodora Medici, DO

## 2022-07-02 ENCOUNTER — Ambulatory Visit (INDEPENDENT_AMBULATORY_CARE_PROVIDER_SITE_OTHER): Payer: Medicare Other | Admitting: Internal Medicine

## 2022-07-02 ENCOUNTER — Encounter: Payer: Self-pay | Admitting: Internal Medicine

## 2022-07-02 VITALS — BP 132/70 | HR 63 | Resp 16 | Ht 59.0 in | Wt 184.0 lb

## 2022-07-02 DIAGNOSIS — N184 Chronic kidney disease, stage 4 (severe): Secondary | ICD-10-CM

## 2022-07-02 DIAGNOSIS — Z9889 Other specified postprocedural states: Secondary | ICD-10-CM | POA: Diagnosis not present

## 2022-07-02 DIAGNOSIS — H9190 Unspecified hearing loss, unspecified ear: Secondary | ICD-10-CM | POA: Diagnosis not present

## 2022-07-02 DIAGNOSIS — I69351 Hemiplegia and hemiparesis following cerebral infarction affecting right dominant side: Secondary | ICD-10-CM | POA: Diagnosis not present

## 2022-07-02 DIAGNOSIS — I69398 Other sequelae of cerebral infarction: Secondary | ICD-10-CM | POA: Diagnosis not present

## 2022-07-02 DIAGNOSIS — R051 Acute cough: Secondary | ICD-10-CM

## 2022-07-02 DIAGNOSIS — I110 Hypertensive heart disease with heart failure: Secondary | ICD-10-CM | POA: Diagnosis not present

## 2022-07-02 DIAGNOSIS — I5030 Unspecified diastolic (congestive) heart failure: Secondary | ICD-10-CM | POA: Diagnosis not present

## 2022-07-02 DIAGNOSIS — E1122 Type 2 diabetes mellitus with diabetic chronic kidney disease: Secondary | ICD-10-CM | POA: Diagnosis not present

## 2022-07-02 MED ORDER — BENZONATATE 100 MG PO CAPS: 100.0000 mg | ORAL_CAPSULE | Freq: Two times a day (BID) | ORAL | 0 refills | Status: DC | PRN

## 2022-07-02 MED ORDER — GUAIFENESIN ER 600 MG PO TB12: 600.0000 mg | ORAL_TABLET | Freq: Every day | ORAL | 0 refills | Status: DC

## 2022-07-02 NOTE — Patient Instructions (Signed)
It was great seeing you today!  Plan discussed at today's visit: -Mucinex sent to pharmacy to take in the morning and cough suppressant to take at night for cough.   Follow up in: 3 months   Take care and let us know if you have any questions or concerns prior to your next visit.  Dr. Rosana Berger

## 2022-07-03 ENCOUNTER — Ambulatory Visit: Payer: Medicare Other | Admitting: Urology

## 2022-07-03 ENCOUNTER — Ambulatory Visit (INDEPENDENT_AMBULATORY_CARE_PROVIDER_SITE_OTHER): Payer: Medicare Other | Admitting: Physician Assistant

## 2022-07-03 ENCOUNTER — Other Ambulatory Visit: Payer: Medicare Other | Admitting: Urology

## 2022-07-03 ENCOUNTER — Encounter: Payer: Self-pay | Admitting: Physician Assistant

## 2022-07-03 VITALS — BP 157/80

## 2022-07-03 DIAGNOSIS — Z435 Encounter for attention to cystostomy: Secondary | ICD-10-CM

## 2022-07-03 DIAGNOSIS — R339 Retention of urine, unspecified: Secondary | ICD-10-CM | POA: Diagnosis not present

## 2022-07-03 NOTE — Progress Notes (Signed)
Suprapubic Cath Change  Patient is present today for a suprapubic catheter change due to urinary retention.  10m of water was drained from the balloon, a 16FR Council tip foley cath was removed from the tract without difficulty.  Site was cleaned and prepped in a sterile fashion with betadine.  A 16FR Council tip foley cath was replaced into the tract no complications were noted. Urine return was noted, 10 ml of sterile water was inflated into the balloon and a night bag was attached for drainage.  Patient tolerated well.    Performed by: SDebroah Loop PA-C   Follow up: Return in about 4 weeks (around 07/31/2022) for SPT exchange.

## 2022-07-31 ENCOUNTER — Other Ambulatory Visit: Payer: Medicare Other | Admitting: Urology

## 2022-08-02 ENCOUNTER — Ambulatory Visit (INDEPENDENT_AMBULATORY_CARE_PROVIDER_SITE_OTHER): Payer: Medicare Other | Admitting: Physician Assistant

## 2022-08-02 VITALS — BP 147/83 | HR 57 | Ht 59.0 in | Wt 190.4 lb

## 2022-08-02 DIAGNOSIS — R339 Retention of urine, unspecified: Secondary | ICD-10-CM

## 2022-08-02 DIAGNOSIS — Z435 Encounter for attention to cystostomy: Secondary | ICD-10-CM

## 2022-08-02 NOTE — Progress Notes (Signed)
Suprapubic Cath Change  Patient is present today for a suprapubic catheter change due to urinary retention.  60ml of water was drained from the balloon, a 16FR Council tip foley cath was removed from the tract without difficulty.  Site was cleaned and prepped in a sterile fashion with betadine.  A 16FR Silastic foley cath was replaced into the tract no complications were noted. Urine return was noted, 10 ml of sterile water was inflated into the balloon and a night bag was attached for drainage.  Patient tolerated well.     Performed by: Debroah Loop, PA-C   Additional notes: We discussed pursuing a clamping trial today. [name] wants her to keep a night bag on overnight to ease urinary management, so we will have Ms. Schubach plug her catheter in the morning and spontaneously void during the day. At the end of the day, she will unplug the catheter and measure her residual before reattaching the night bag for overnight. I've asked them to keep a lot of her daytime residuals for the next week, continue Flomax once daily, and contact me via telephone with her results next week. Will determine next steps based on this data.  Follow up: Return in about 4 weeks (around 08/30/2022) for SPT exchange.

## 2022-08-02 NOTE — Patient Instructions (Signed)
Continue your Flomax (tamsulosin) once daily.  Every morning, you may take off the night bag and plug your catheter with the plug I gave you. You should spend the day urinating on your own when you need to.  At the end of the day, remove the catheter plug and collect the urine that comes out of your catheter in either of the measuring containers I gave you today. Please write down the volume of urine that you collect. After the urine stops flowing, then you can reattach the night bag for overnight.  Please keep a list of the amounts of urine that you collect from your plugged catheter at the end of the day for the next week, and then call me at clinic to let me know what these numbers are so we can decide what to do next.  If you are having a lot of bladder pain with the catheter plugged or feel like you need to urinate but you can't, it is okay to remove the plug and put the night bag back on so the catheter drains again.

## 2022-08-03 ENCOUNTER — Ambulatory Visit: Payer: Medicare Other | Admitting: Physician Assistant

## 2022-08-22 DIAGNOSIS — F82 Specific developmental disorder of motor function: Secondary | ICD-10-CM | POA: Diagnosis not present

## 2022-08-22 DIAGNOSIS — R42 Dizziness and giddiness: Secondary | ICD-10-CM | POA: Diagnosis not present

## 2022-08-22 DIAGNOSIS — G4089 Other seizures: Secondary | ICD-10-CM | POA: Diagnosis not present

## 2022-08-22 DIAGNOSIS — R4181 Age-related cognitive decline: Secondary | ICD-10-CM | POA: Diagnosis not present

## 2022-08-27 ENCOUNTER — Ambulatory Visit: Payer: Medicare Other | Attending: Cardiovascular Disease | Admitting: Cardiovascular Disease

## 2022-08-27 ENCOUNTER — Encounter: Payer: Self-pay | Admitting: Cardiovascular Disease

## 2022-08-27 VITALS — BP 124/60 | HR 55 | Ht 61.0 in | Wt 187.2 lb

## 2022-08-27 DIAGNOSIS — N184 Chronic kidney disease, stage 4 (severe): Secondary | ICD-10-CM | POA: Diagnosis not present

## 2022-08-27 DIAGNOSIS — E1122 Type 2 diabetes mellitus with diabetic chronic kidney disease: Secondary | ICD-10-CM | POA: Insufficient documentation

## 2022-08-27 DIAGNOSIS — N185 Chronic kidney disease, stage 5: Secondary | ICD-10-CM | POA: Insufficient documentation

## 2022-08-27 DIAGNOSIS — I5030 Unspecified diastolic (congestive) heart failure: Secondary | ICD-10-CM | POA: Insufficient documentation

## 2022-08-27 DIAGNOSIS — I25118 Atherosclerotic heart disease of native coronary artery with other forms of angina pectoris: Secondary | ICD-10-CM | POA: Insufficient documentation

## 2022-08-27 DIAGNOSIS — I5032 Chronic diastolic (congestive) heart failure: Secondary | ICD-10-CM

## 2022-08-27 DIAGNOSIS — E782 Mixed hyperlipidemia: Secondary | ICD-10-CM | POA: Diagnosis not present

## 2022-08-27 DIAGNOSIS — I10 Essential (primary) hypertension: Secondary | ICD-10-CM | POA: Diagnosis not present

## 2022-08-27 MED ORDER — TORSEMIDE 20 MG PO TABS: 20.0000 mg | ORAL_TABLET | Freq: Every day | ORAL | 3 refills | Status: DC

## 2022-08-27 MED ORDER — REPATHA SURECLICK 140 MG/ML ~~LOC~~ SOAJ: 140.0000 mg | SUBCUTANEOUS | 6 refills | Status: DC

## 2022-08-27 NOTE — Patient Instructions (Signed)
Medication Instructions:  Repatha 140 sq once  every two weeks  If you need a refill on your cardiac medications before your next appointment, please call your pharmacy.   Lab work: No new labs needed  Testing/Procedures: No new testing needed  Follow-Up: At [facility], you and your health needs are our priority.  As part of our continuing mission to provide you with exceptional heart care, we have created designated Provider Care Teams.  These Care Teams include your primary Cardiologist (physician) and Advanced Practice Providers (APPs -  Physician Assistants and Nurse Practitioners) who all work together to provide you with the care you need, when you need it.  You will need a follow up appointment in 12 months  Providers on your designated Care Team:   Nicolasa Ducking, NP Eula Listen, PA-C Cadence Fransico Michael, [place]  COVID-19 Vaccine Information can be found at: [email or web address] For questions related to vaccine distribution or appointments, please email [email or web address] or call [phone].

## 2022-08-27 NOTE — Progress Notes (Signed)
Cardiology Office Note  Date:  08/27/2022   ID:  Leslie Houston, DOB 19-Sep-1950, MRN [id number]  PCP:  [name], DO   Chief Complaint  Patient presents with   6 month follow up     "Doing well." Medications reviewed by the patient verbally.     HPI:  Leslie Houston is a 72 y.o. female with a hx of  Coronary artery disease, history of stenting 2000 (details unavailable) chronic kidney disease,  Former smoker  seizure disorder,  prior stroke residual right-sided vision loss/hearing loss,  hospital March 2021 with worsening renal function, worsening leg swelling shortness of breath symptoms over the past 2 months Who presents for office follow-up of her diastolic CHF  Last seen in clinic by myself October 2022, In follow-up today she reports feeling well Denies significant leg swelling/edema, no abdominal distention Feels her breathing is good Daughter who presents with her reports that her mother can out walk her Previously reported Difficulty walking, residual deficits on the right from her prior stroke, right leg weakness  Followed by Dr. Thedore Mins, CR 2.5  Has suprapubic cath Recent urinary tract infection, treated  No regular exercise program Continues on torsemide 20 daily  CT scan abdomen February 2024 reviewed, images pulled up Moderate diffuse aortic atherosclerosis noted  Reports previously did not tolerate Zetia, lovastatin secondary to side effects, myalgias, skin rash  EKG personally reviewed by myself on todays visit Sinus bradycardia rate 55 bpm no significant ST-T wave changes  Other past medical history reviewed  hospital March 2021 massive leg swelling, shortness of breath,anemic hematocrit 26/hemoglobin 8.7 Amlodipine was held, Echo with normal ejection fraction It was felt her anemia was secondary to renal disease and iron deficiency  Blood pressure medication changes made Had obstructive uropathy Sinus pauses initially noted on  telemetry resolved Felt vagal reaction, no indication for pacing Recommendation made for outpatient sleep study It was recommended that she avoid beta-blockers, clonidine, verapamil/diltiazem  2D echo 07/03/2019:  1. Left ventricular ejection fraction, by estimation, is 50 to 55%.   PMH:   has a past medical history of (HFpEF) heart failure with preserved ejection fraction (HCC), Acute left PCA stroke (HCC) (06/22/2014), Anxiety, CAD (coronary artery disease), Carotid stenosis, CKD (chronic kidney disease), stage IV (HCC), Depression, Diabetes mellitus (HCC), Edentulous, Family hx of colon cancer (06/13/2018), Gout, History of cataract extraction, left (03/21/2021), HOH (hard of hearing), Hyperlipidemia, Hypertension, Intellectual disability, Myalgia due to statin, Orthostatic hypotension, OSA (obstructive sleep apnea), Osteopenia (01/24/2018), PONV (postoperative nausea and vomiting), PTSD (post-traumatic stress disorder), PUD (peptic ulcer disease), PVC's (premature ventricular contractions), Right sided weakness (05/2014), Seizures (HCC), Sinus bradycardia, Syncope, and 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).  PSH:    Past Surgical History:  Procedure Laterality Date   BLADDER SURGERY     CATARACT EXTRACTION W/PHACO Left 03/21/2021   Procedure: CATARACT EXTRACTION PHACO AND INTRAOCULAR LENS PLACEMENT (IOC) left 5.86 00:38.0;  Surgeon: Galen Manila, MD;  Location: [facility];  Service: Ophthalmology;  Laterality: Left;  Latex   CESAREAN SECTION     CORONARY ANGIOPLASTY WITH STENT PLACEMENT     CYSTOSCOPY N/A 03/12/2022   Procedure: CYSTOSCOPY;  Surgeon: Vanna Scotland, MD;  Location: [facility];  Service: Urology;  Laterality: N/A;   DENTAL SURGERY     INSERTION OF SUPRAPUBIC CATHETER N/A 03/12/2022   Procedure: INSERTION OF SUPRAPUBIC CATHETER;  Surgeon: Vanna Scotland, MD;  Location: [facility];  Service: Urology;  Laterality: N/A;   LAPAROTOMY N/A 03/22/2022  Procedure: EXPLORATORY LAPAROTOMY; LYSIS OF ADHESIONS;  Surgeon: Sung Amabile, DO;  Location: [facility];  Service: General;  Laterality: N/A;   RECTAL EXAM UNDER ANESTHESIA N/A 03/12/2022   Procedure: PELVIC EXAM UNDER ANESTHESIA;  Surgeon: Vanna Scotland, MD;  Location: [facility];  Service: Urology;  Laterality: N/A;    Current Outpatient Medications  Medication Sig Dispense Refill   benzonatate (TESSALON) 100 MG capsule Take 1 capsule (100 mg total) by mouth 2 (two) times daily as needed for cough. 20 capsule 0   Cholecalciferol (VITAMIN D3) 25 MCG (1000 UT) CAPS Take 1 capsule by mouth daily.     citalopram (CELEXA) 20 MG tablet TAKE 1 TABLET BY MOUTH ONCE A DAY 90 tablet 0   feeding supplement (ENSURE ENLIVE / ENSURE PLUS) LIQD Take 237 mLs by mouth 2 (two) times daily between meals. 237 mL 12   ferrous sulfate 325 (65 FE) MG tablet Take 325 mg by mouth daily with breakfast.     fluticasone (FLONASE) 50 MCG/ACT nasal spray Place 2 sprays into both nostrils daily as needed for allergies (allergies).      guaiFENesin (MUCINEX) 600 MG 12 hr tablet Take 1 tablet (600 mg total) by mouth daily. 30 tablet 0   hydrALAZINE (APRESOLINE) 10 MG tablet Take 10 mg by mouth daily.     levETIRAcetam (KEPPRA) 1000 MG tablet Take 1 tablet (1,000 mg total) by mouth 2 (two) times daily. 180 tablet 3   loratadine (CLARITIN) 10 MG tablet TAKE 1 TABLET BY MOUTH ONCE A DAY AS NEEDED FOR ALLERGIES 30 tablet 3   Multiple Vitamin (MULTIVITAMIN WITH MINERALS) TABS tablet Take 1 tablet by mouth daily. 30 tablet 1   nystatin (MYCOSTATIN/NYSTOP) powder Apply 1 Application topically 3 (three) times daily. 60 g 1   ondansetron (ZOFRAN-ODT) 4 MG disintegrating tablet Take 1 tablet (4 mg total) by mouth every 8 (eight) hours as needed. 20 tablet 0   pantoprazole (PROTONIX) 20 MG tablet TAKE 1 TABLET BY MOUTH ONCE A DAY 90 tablet 0   polyethylene glycol (MIRALAX / GLYCOLAX) 17 g packet Take 17 g by mouth daily. 14 each 0    tamsulosin (FLOMAX) 0.4 MG CAPS capsule Take 1 capsule (0.4 mg total) by mouth daily. 30 capsule 5   torsemide (DEMADEX) 20 MG tablet TAKE 1 TABLET BY MOUTH ONCE DAILY 90 tablet 1   No current facility-administered medications for this visit.    Allergies:   Bactrim [sulfamethoxazole-trimethoprim], Latex, Penicillin g, Atorvastatin, Other, Amlodipine, and Doxycycline   Social History:  The patient  reports that she quit smoking about 8 years ago. Her smoking use included cigarettes. She has been exposed to tobacco smoke. She has never used smokeless tobacco. She reports that she does not drink alcohol and does not use drugs.   Family History:   family history includes Cancer in her paternal grandfather; Colon cancer in her father; Diabetes Mellitus II in her daughter; Heart attack in her mother; Heart failure in her son; Kidney disease in her son; Lung cancer in her mother; Multiple sclerosis in her daughter; Prostate cancer in her father; Stroke in her father and mother.    Review of Systems: Review of Systems  Constitutional: Negative.   HENT: Negative.    Eyes: Negative.   Respiratory: Negative.    Cardiovascular: Negative.   Gastrointestinal: Negative.   Musculoskeletal: Negative.   Neurological: Negative.   Psychiatric/Behavioral: Negative.    All other systems reviewed and are negative.   PHYSICAL EXAM: VS:  BP 124/60 (BP Location: Left Arm, Patient Position: Sitting, Cuff Size: Normal)   Pulse (!) 55   Ht 5\' 1"  (1.549 m)   Wt 187 lb 4 oz (84.9 kg)   SpO2 97%   BMI 35.38 kg/m  , BMI Body mass index is 35.38 kg/m. Constitutional:  oriented to person, place, and time. No distress.  HENT:  Head: Grossly normal Eyes:  no discharge. No scleral icterus.  Neck: No JVD, no carotid bruits  Cardiovascular: Regular rate and rhythm, no murmurs appreciated Pulmonary/Chest: Clear to auscultation bilaterally, no wheezes or rails Abdominal: Soft.  no distension.  no tenderness.   Musculoskeletal: Normal range of motion Neurological:  normal muscle tone. Coordination normal. No atrophy Skin: Skin warm and dry Psychiatric: normal affect, pleasant  Recent Labs: 05/03/2022: Magnesium 2.2 06/10/2022: ALT 31; BUN 36; Creatinine, Ser 2.64; Hemoglobin 12.2; Platelets 246; Potassium 3.1; Sodium 138    Lipid Panel Lab Results  Component Value Date   CHOL 181 12/05/2021   HDL 73 12/05/2021   LDLCALC 87 12/05/2021   TRIG 53 04/05/2022      Wt Readings from Last 3 Encounters:  08/27/22 187 lb 4 oz (84.9 kg)  08/02/22 190 lb 6.4 oz (86.4 kg)  07/02/22 184 lb (83.5 kg)     ASSESSMENT AND PLAN:  Problem List Items Addressed This Visit       Cardiology Problems   Essential hypertension   Coronary artery disease - Primary   Other Visit Diagnoses     Chronic diastolic heart failure (HCC)       CKD (chronic kidney disease), stage IV (HCC)       Hyperlipidemia, mixed       Type 2 diabetes mellitus with stage 5 chronic kidney disease not on chronic dialysis, without long-term current use of insulin (HCC)       Heart failure with preserved ejection fraction, unspecified HF chronicity (HCC)          Acute on chronic diastolic CHF Appears euvolemic Torsemide 20 daily Weight stable Creatinine stable 2.5, followed by nephrology Denies high salt or fluid intake  Chronic kidney disease Followed by nephrology, creatinine 2.5  Anemia Hemoglobin stable, hemoglobin 9.5  History of stroke On aspirin,  Did not tolerate statins Residual right-sided deficits Normal sinus rhythm  History of obstructive uropathy Followed by urology  Coronary artery disease with prior stenting Currently with no symptoms of angina. No further workup at this time.  Recommend she start Repatha for hyperlipidemia as she did not tolerate Zetia, lovastatin   Total encounter time more than 30 minutes  Greater than 50% was spent in counseling and coordination of care with the  patient    Signed, Dossie Arbour, M.D., Ph.D. [facility] [place], [place] [phone]

## 2022-08-30 ENCOUNTER — Other Ambulatory Visit: Payer: Self-pay | Admitting: Internal Medicine

## 2022-08-30 ENCOUNTER — Ambulatory Visit (INDEPENDENT_AMBULATORY_CARE_PROVIDER_SITE_OTHER): Payer: Medicare Other | Admitting: Physician Assistant

## 2022-08-30 VITALS — BP 161/79 | HR 55 | Ht 61.0 in | Wt 187.0 lb

## 2022-08-30 DIAGNOSIS — Z435 Encounter for attention to cystostomy: Secondary | ICD-10-CM

## 2022-08-30 DIAGNOSIS — R339 Retention of urine, unspecified: Secondary | ICD-10-CM

## 2022-08-30 NOTE — Progress Notes (Signed)
Suprapubic Cath Change  Patient is present today for a suprapubic catheter change due to urinary retention.  8ml of water was drained from the balloon, a 16FR Silastic foley cath was removed from the tract with out difficulty.  Site was cleaned and prepped in a sterile fashion with betadine.  A 16FR Silastic foley cath was replaced into the tract no complications were noted. Urine return was noted, 10 ml of sterile water was inflated into the balloon and a leg bag was attached for drainage.  Patient tolerated well.     Performed by: Carman Ching, PA-C and Wynona Dove, RN  Additional notes: They attempted clamping trial at home but Coy Saunas noted no UOP when she'd attempt to void so they stopped it and resumed continuous drainage to leg bag. Agree with this plan.  Follow up: Return in about 4 weeks (around 09/27/2022) for SPT exchange.

## 2022-08-31 ENCOUNTER — Other Ambulatory Visit: Payer: Self-pay

## 2022-08-31 NOTE — Telephone Encounter (Signed)
Requested medication (s) are due for refill today: yes  Requested medication (s) are on the active medication list: yes  Last refill:  12/06/21  Future visit scheduled: yes  Notes to clinic:   Manual Review: Route requests for 50,000 IU strength to the provider.     Requested Prescriptions  Pending Prescriptions Disp Refills   Vitamin D, Ergocalciferol, (DRISDOL) 1.25 MG (50000 UNIT) CAPS capsule [Pharmacy Med Name: VITAMIN D 50000UNT CAPSULE] 12 capsule 0    Sig: TAKE ONE CAPSULE BY MOUTH EVERY SEVEN DAYS     Endocrinology:  Vitamins - Vitamin D Supplementation 2 Failed - 08/30/2022  1:36 PM      Failed - Manual Review: Route requests for 50,000 IU strength to the provider      Failed - Vitamin D in normal range and within 360 days    Vit D, 25-Hydroxy  Date Value Ref Range Status  12/05/2021 24 (L) 30 - 100 ng/mL Final    Comment:    Vitamin D Status         25-OH Vitamin D: . Deficiency:                    <20 ng/mL Insufficiency:             20 - 29 ng/mL Optimal:                 > or = 30 ng/mL . For 25-OH Vitamin D testing on patients on  D2-supplementation and patients for whom quantitation  of D2 and D3 fractions is required, the [employer]ureD(TM) 25-OH VIT D, (D2,D3), LC/MS/MS is recommended: order  code 52841 (patients >22yrs). . See Note 1 . Note 1 . For additional information, please refer to  http://education.Quest[employer].com/faq/FAQ199  (This link is being provided for informational/ educational purposes only.)          Passed - Ca in normal range and within 360 days    Calcium  Date Value Ref Range Status  06/10/2022 9.8 8.9 - 10.3 mg/dL Final   Calcium, Total  Date Value Ref Range Status  08/19/2014 10.2 mg/dL Final    Comment:    3.2-44.0 NOTE: New Reference Range  07/06/14          Passed - Valid encounter within last 12 months    Recent Outpatient Visits           2 months ago Acute cough   [facility]  [name], DO   3 months ago Hospital discharge follow-up   [facility] [name], DO   4 months ago Bilateral lower extremity edema   [facility] Berniece Salines, FNP   8 months ago Dysfunction of both eustachian tubes   [name] [name], DO   10 months ago Moderate episode of recurrent major depressive disorder [facility])   [facility] [name], DO       Future Appointments             In 1 month Vaillancourt, Har[facility] [place]   In 1 month [name], DO [facility]

## 2022-09-02 ENCOUNTER — Emergency Department: Payer: Medicare Other

## 2022-09-02 ENCOUNTER — Emergency Department
Admission: EM | Admit: 2022-09-02 | Discharge: 2022-09-02 | Disposition: A | Discharge status: Home / Self Care | Payer: Medicare Other | Attending: Emergency Medicine | Admitting: Emergency Medicine

## 2022-09-02 ENCOUNTER — Other Ambulatory Visit: Payer: Self-pay

## 2022-09-02 DIAGNOSIS — N184 Chronic kidney disease, stage 4 (severe): Secondary | ICD-10-CM

## 2022-09-02 DIAGNOSIS — K219 Gastro-esophageal reflux disease without esophagitis: Secondary | ICD-10-CM

## 2022-09-02 DIAGNOSIS — X58XXXA Exposure to other specified factors, initial encounter: Secondary | ICD-10-CM | POA: Diagnosis not present

## 2022-09-02 DIAGNOSIS — I700 Atherosclerosis of aorta: Secondary | ICD-10-CM | POA: Diagnosis not present

## 2022-09-02 DIAGNOSIS — S4991XA Unspecified injury of right shoulder and upper arm, initial encounter: Secondary | ICD-10-CM | POA: Diagnosis present

## 2022-09-02 DIAGNOSIS — I129 Hypertensive chronic kidney disease with stage 1 through stage 4 chronic kidney disease, or unspecified chronic kidney disease: Secondary | ICD-10-CM | POA: Diagnosis not present

## 2022-09-02 DIAGNOSIS — M19011 Primary osteoarthritis, right shoulder: Secondary | ICD-10-CM | POA: Diagnosis not present

## 2022-09-02 DIAGNOSIS — M79621 Pain in right upper arm: Secondary | ICD-10-CM | POA: Diagnosis not present

## 2022-09-02 DIAGNOSIS — S40021A Contusion of right upper arm, initial encounter: Secondary | ICD-10-CM | POA: Insufficient documentation

## 2022-09-02 DIAGNOSIS — R1084 Generalized abdominal pain: Secondary | ICD-10-CM | POA: Diagnosis not present

## 2022-09-02 DIAGNOSIS — R109 Unspecified abdominal pain: Secondary | ICD-10-CM

## 2022-09-02 LAB — CBC
HCT: 40.4 % (ref 36.0–46.0)
Hemoglobin: 12.9 g/dL (ref 12.0–15.0)
MCH: 32.3 pg (ref 26.0–34.0)
MCHC: 31.9 g/dL (ref 30.0–36.0)
MCV: 101.3 fL — ABNORMAL HIGH (ref 80.0–100.0)
Platelets: 178 10*3/uL (ref 150–400)
RBC: 3.99 MIL/uL (ref 3.87–5.11)
RDW: 12.9 % (ref 11.5–15.5)
WBC: 3.6 10*3/uL — ABNORMAL LOW (ref 4.0–10.5)
nRBC: 0 % (ref 0.0–0.2)

## 2022-09-02 LAB — COMPREHENSIVE METABOLIC PANEL
ALT: 12 U/L (ref 0–44)
AST: 20 U/L (ref 15–41)
Albumin: 3.7 g/dL (ref 3.5–5.0)
Alkaline Phosphatase: 116 U/L (ref 38–126)
Anion gap: 11 (ref 5–15)
BUN: 47 mg/dL — ABNORMAL HIGH (ref 8–23)
CO2: 22 mmol/L (ref 22–32)
Calcium: 9.3 mg/dL (ref 8.9–10.3)
Chloride: 104 mmol/L (ref 98–111)
Creatinine, Ser: 2.46 mg/dL — ABNORMAL HIGH (ref 0.44–1.00)
GFR, Estimated: 20 mL/min — ABNORMAL LOW (ref >60)
Glucose, Bld: 96 mg/dL (ref 70–99)
Potassium: 4 mmol/L (ref 3.5–5.1)
Sodium: 137 mmol/L (ref 135–145)
Total Bilirubin: 0.7 mg/dL (ref 0.3–1.2)
Total Protein: 7.6 g/dL (ref 6.5–8.1)

## 2022-09-02 LAB — LIPASE, BLOOD: Lipase: 35 U/L (ref 11–51)

## 2022-09-02 MED ORDER — ALUM & MAG HYDROXIDE-SIMETH 200-200-20 MG/5ML PO SUSP
30.0000 mL | Freq: Once | ORAL | Status: AC
  Administered 2022-09-02: 30 mL via ORAL
  Filled 2022-09-02: qty 30

## 2022-09-02 MED ORDER — ALUMINUM-MAGNESIUM-SIMETHICONE 200-200-20 MG/5ML PO SUSP: 30.0000 mL | Freq: Three times a day (TID) | ORAL | 0 refills | Status: DC

## 2022-09-02 MED ORDER — PANTOPRAZOLE SODIUM 40 MG IV SOLR
40.0000 mg | Freq: Once | INTRAVENOUS | Status: AC
  Administered 2022-09-02: 40 mg via INTRAVENOUS
  Filled 2022-09-02: qty 10

## 2022-09-02 MED ORDER — METOCLOPRAMIDE HCL 5 MG/ML IJ SOLN
10.0000 mg | INTRAMUSCULAR | Status: AC
  Administered 2022-09-02: 10 mg via INTRAVENOUS
  Filled 2022-09-02: qty 2

## 2022-09-02 MED ORDER — METOCLOPRAMIDE HCL 10 MG PO TABS: 10.0000 mg | ORAL_TABLET | Freq: Four times a day (QID) | ORAL | 0 refills | Status: AC | PRN

## 2022-09-02 MED ORDER — SODIUM CHLORIDE 0.9 % IV BOLUS
500.0000 mL | Freq: Once | INTRAVENOUS | Status: AC
  Administered 2022-09-02: 500 mL via INTRAVENOUS

## 2022-09-02 NOTE — ED Provider Notes (Signed)
[facility] Provider Note    Event Date/Time   First MD Initiated Contact with Patient 09/02/22 1614     (approximate)   History   Chief Complaint: Abdominal Pain   HPI  Leslie Houston is a 72 y.o. female  with pmh htn, ckd, cva who comes to the ED c/o n/v and upper abd pain starting last night about 2am. Prior to sx onset, she last ate at 7pm the night before - hotdog at a [employer].  No pain during dinner. No difficulty swallowing or breathing. No cp. No fever.   She also c/o bruising on her right upper arm. Denies any trauma to the area or falls.      Physical Exam   Triage Vital Signs: ED Triage Vitals  Enc Vitals Group     BP 09/02/22 1457 (!) 170/65     Pulse Rate 09/02/22 1457 (!) 52     Resp 09/02/22 1457 18     Temp 09/02/22 1457 97.7 F (36.5 C)     Temp Source 09/02/22 1457 Oral     SpO2 09/02/22 1457 98 %     Weight 09/02/22 1457 186 lb (84.4 kg)     Height 09/02/22 1457 5\' 1"  (1.549 m)     Head Circumference --      Peak Flow --      Pain Score 09/02/22 1455 8     Pain Loc --      Pain Edu? --      Excl. in GC? --     Most recent vital signs: Vitals:   09/02/22 1457  BP: (!) 170/65  Pulse: (!) 52  Resp: 18  Temp: 97.7 F (36.5 C)  SpO2: 98%    General: Awake, no distress.  CV:  Good peripheral perfusion. RRR Resp:  Normal effort. ctab Abd:  No distention. Soft with generalized ttp. Not peritoneal Other:  MMM.  There is a small area of bony tenderness on the right  distal humerus without deformity. There is ecchymosis on the medial upper RUE   ED Results / Procedures / Treatments   Labs (all labs ordered are listed, but only abnormal results are displayed) Labs Reviewed  COMPREHENSIVE METABOLIC PANEL - Abnormal; Notable for the following components:      Result Value   BUN 47 (*)    Creatinine, Ser 2.46 (*)    GFR, Estimated 20 (*)    All other components within normal limits  CBC - Abnormal; Notable  for the following components:   WBC 3.6 (*)    MCV 101.3 (*)    All other components within normal limits  LIPASE, BLOOD     EKG    RADIOLOGY CT a/p interpreted by me, negative for sbo or free air. Radiology report reviewed  Xray humerus negative for fx   PROCEDURES:  Procedures   MEDICATIONS ORDERED IN ED: Medications  sodium chloride 0.9 % bolus 500 mL (0 mLs Intravenous Stopped 09/02/22 1909)  pantoprazole (PROTONIX) injection 40 mg (40 mg Intravenous Given 09/02/22 1748)  alum & mag hydroxide-simeth (MAALOX/MYLANTA) 200-200-20 MG/5ML suspension 30 mL (30 mLs Oral Given 09/02/22 1747)  metoCLOPramide (REGLAN) injection 10 mg (10 mg Intravenous Given 09/02/22 1747)     IMPRESSION / MDM / ASSESSMENT AND PLAN / ED COURSE  I reviewed the triage vital signs and the nursing notes.  DDx: sbo, gi perforation, gerd, pancreatitis, food borne illness  Patient's presentation is most consistent with acute presentation with  potential threat to life or bodily function.  Pt p/w abd/n/v with ttp on exam. Non toxic. Will check labs, CT a/p, and xray humerus due to tenderness and ecchymosis despite no known trauma.   Clinical Course as of 09/03/22 0847  Wynelle Link Sep 02, 2022  1726 Labs unremarkable with stable CKD stage IV.  Awaiting urinalysis.  CT abdomen pelvis negative for bowel obstruction or other acute findings, right humerus negative.  Will give medications and p.o. trial. [PS]  1841 Feeling better, passed po trial. Stable for DC.  [PS]    Clinical Course User Index [PS] Sharman Cheek, MD     FINAL CLINICAL IMPRESSION(S) / ED DIAGNOSES   Final diagnoses:  Abdominal pain, unspecified abdominal location  Gastroesophageal reflux disease, unspecified whether esophagitis present  CKD (chronic kidney disease) stage 4, GFR 15-29 ml/min (HCC)     Rx / DC Orders   ED Discharge Orders          Ordered    aluminum-magnesium hydroxide-simethicone (MAALOX) 200-200-20 MG/5ML SUSP   3 times daily before meals & bedtime        09/02/22 1845    metoCLOPramide (REGLAN) 10 MG tablet  Every 6 hours PRN        09/02/22 1845             Note:  This document was prepared using Dragon voice recognition software and may include unintentional dictation errors.   Sharman Cheek, MD 09/03/22 [phone]

## 2022-09-02 NOTE — Discharge Instructions (Addendum)
Your lab tests and CT scans today were all okay.  Continue taking all of your home medications, and add on Maalox and metoclopramide as needed over the next few days for recurrent stomach upset.

## 2022-09-02 NOTE — ED Notes (Signed)
First nurse note: From home [facility] for abdominal pain X 1 day VSS Was dry heaving Has suprapubic catheter Well appearing

## 2022-09-02 NOTE — ED Notes (Signed)
Pt successfully completed PO fluid challenge, Md [place] notified

## 2022-09-02 NOTE — ED Triage Notes (Addendum)
Pt presents with abd pain with N/V that started last night after eating a hotdog. Pt had some relief of nausea when she took ondansetron at 13:30. Pt has a hx of bowel obstruction with surgical intervention. Pt last normal BM was yesterday. Pt has been passing gas. Pt also has large bruised area to R upper posterior arm that developed yesterday.

## 2022-09-11 ENCOUNTER — Other Ambulatory Visit: Payer: Self-pay

## 2022-09-11 ENCOUNTER — Emergency Department: Payer: Medicare Other

## 2022-09-11 ENCOUNTER — Emergency Department
Admission: EM | Admit: 2022-09-11 | Discharge: 2022-09-11 | Disposition: A | Discharge status: Home / Self Care | Payer: Medicare Other | Attending: Emergency Medicine | Admitting: Emergency Medicine

## 2022-09-11 DIAGNOSIS — R42 Dizziness and giddiness: Secondary | ICD-10-CM | POA: Diagnosis not present

## 2022-09-11 DIAGNOSIS — M542 Cervicalgia: Secondary | ICD-10-CM | POA: Insufficient documentation

## 2022-09-11 DIAGNOSIS — I129 Hypertensive chronic kidney disease with stage 1 through stage 4 chronic kidney disease, or unspecified chronic kidney disease: Secondary | ICD-10-CM | POA: Diagnosis not present

## 2022-09-11 DIAGNOSIS — S60221A Contusion of right hand, initial encounter: Secondary | ICD-10-CM | POA: Insufficient documentation

## 2022-09-11 DIAGNOSIS — N189 Chronic kidney disease, unspecified: Secondary | ICD-10-CM | POA: Insufficient documentation

## 2022-09-11 DIAGNOSIS — M79601 Pain in right arm: Secondary | ICD-10-CM | POA: Diagnosis not present

## 2022-09-11 DIAGNOSIS — E119 Type 2 diabetes mellitus without complications: Secondary | ICD-10-CM | POA: Insufficient documentation

## 2022-09-11 DIAGNOSIS — S0993XA Unspecified injury of face, initial encounter: Secondary | ICD-10-CM | POA: Diagnosis not present

## 2022-09-11 DIAGNOSIS — M25562 Pain in left knee: Secondary | ICD-10-CM | POA: Insufficient documentation

## 2022-09-11 DIAGNOSIS — R001 Bradycardia, unspecified: Secondary | ICD-10-CM | POA: Diagnosis not present

## 2022-09-11 DIAGNOSIS — S0011XA Contusion of right eyelid and periocular area, initial encounter: Secondary | ICD-10-CM | POA: Insufficient documentation

## 2022-09-11 DIAGNOSIS — W19XXXA Unspecified fall, initial encounter: Secondary | ICD-10-CM | POA: Diagnosis not present

## 2022-09-11 DIAGNOSIS — S199XXA Unspecified injury of neck, initial encounter: Secondary | ICD-10-CM | POA: Diagnosis not present

## 2022-09-11 DIAGNOSIS — S5001XA Contusion of right elbow, initial encounter: Secondary | ICD-10-CM | POA: Diagnosis not present

## 2022-09-11 DIAGNOSIS — S0990XA Unspecified injury of head, initial encounter: Secondary | ICD-10-CM | POA: Diagnosis not present

## 2022-09-11 DIAGNOSIS — S0012XA Contusion of left eyelid and periocular area, initial encounter: Secondary | ICD-10-CM | POA: Insufficient documentation

## 2022-09-11 DIAGNOSIS — S0003XA Contusion of scalp, initial encounter: Secondary | ICD-10-CM | POA: Diagnosis not present

## 2022-09-11 DIAGNOSIS — I10 Essential (primary) hypertension: Secondary | ICD-10-CM | POA: Diagnosis not present

## 2022-09-11 DIAGNOSIS — R600 Localized edema: Secondary | ICD-10-CM | POA: Diagnosis not present

## 2022-09-11 DIAGNOSIS — M7989 Other specified soft tissue disorders: Secondary | ICD-10-CM | POA: Diagnosis not present

## 2022-09-11 DIAGNOSIS — T07XXXA Unspecified multiple injuries, initial encounter: Secondary | ICD-10-CM

## 2022-09-11 DIAGNOSIS — S0083XA Contusion of other part of head, initial encounter: Secondary | ICD-10-CM | POA: Diagnosis not present

## 2022-09-11 DIAGNOSIS — I959 Hypotension, unspecified: Secondary | ICD-10-CM | POA: Diagnosis not present

## 2022-09-11 DIAGNOSIS — R609 Edema, unspecified: Secondary | ICD-10-CM | POA: Diagnosis not present

## 2022-09-11 DIAGNOSIS — R0902 Hypoxemia: Secondary | ICD-10-CM | POA: Diagnosis not present

## 2022-09-11 DIAGNOSIS — R220 Localized swelling, mass and lump, head: Secondary | ICD-10-CM | POA: Diagnosis not present

## 2022-09-11 LAB — BASIC METABOLIC PANEL
Anion gap: 13 (ref 5–15)
BUN: 42 mg/dL — ABNORMAL HIGH (ref 8–23)
CO2: 20 mmol/L — ABNORMAL LOW (ref 22–32)
Calcium: 9.1 mg/dL (ref 8.9–10.3)
Chloride: 104 mmol/L (ref 98–111)
Creatinine, Ser: 2.47 mg/dL — ABNORMAL HIGH (ref 0.44–1.00)
GFR, Estimated: 20 mL/min — ABNORMAL LOW (ref >60)
Glucose, Bld: 70 mg/dL (ref 70–99)
Potassium: 4.4 mmol/L (ref 3.5–5.1)
Sodium: 137 mmol/L (ref 135–145)

## 2022-09-11 LAB — CBC
HCT: 40 % (ref 36.0–46.0)
Hemoglobin: 13.2 g/dL (ref 12.0–15.0)
MCH: 32.7 pg (ref 26.0–34.0)
MCHC: 33 g/dL (ref 30.0–36.0)
MCV: 99 fL (ref 80.0–100.0)
Platelets: 165 10*3/uL (ref 150–400)
RBC: 4.04 MIL/uL (ref 3.87–5.11)
RDW: 13 % (ref 11.5–15.5)
WBC: 4 10*3/uL (ref 4.0–10.5)
nRBC: 0 % (ref 0.0–0.2)

## 2022-09-11 MED ORDER — TRAMADOL HCL 50 MG PO TABS: 50.0000 mg | ORAL_TABLET | Freq: Two times a day (BID) | ORAL | 0 refills | Status: DC | PRN

## 2022-09-11 MED ORDER — ONDANSETRON HCL 4 MG/2ML IJ SOLN
4.0000 mg | Freq: Once | INTRAMUSCULAR | Status: AC
  Administered 2022-09-11: 4 mg via INTRAVENOUS
  Filled 2022-09-11: qty 2

## 2022-09-11 MED ORDER — MORPHINE SULFATE (PF) 2 MG/ML IV SOLN
2.0000 mg | Freq: Once | INTRAVENOUS | Status: AC
  Administered 2022-09-11: 2 mg via INTRAVENOUS
  Filled 2022-09-11: qty 1

## 2022-09-11 MED ORDER — ACETAMINOPHEN 325 MG PO TABS
650.0000 mg | ORAL_TABLET | Freq: Once | ORAL | Status: AC
  Administered 2022-09-11: 650 mg via ORAL
  Filled 2022-09-11: qty 2

## 2022-09-11 NOTE — Progress Notes (Signed)
U/S IV attempted LT lower FA without success.- Ultrasound to Lt upper FA pt has no cephalic, visualized,  other veins too deep to attempt. PA aware.

## 2022-09-11 NOTE — Discharge Instructions (Signed)
Apply ice to all areas that hurt.  Wear the sling for comfort.  Follow-up with orthopedics if the swelling and pain or not better in the extremities in 1 week.  Return if worsening

## 2022-09-11 NOTE — ED Notes (Signed)
Pt is a hard stick and would like an IV if she needs blood work.

## 2022-09-11 NOTE — ED Provider Notes (Signed)
[facility] Provider Note    Event Date/Time   First MD Initiated Contact with Patient 09/11/22 1341     (approximate)   History   Fall   HPI  Leslie Houston is a 72 y.o. female  with history of CVA, CKD, PUD, diabetes, hypertension, presents to the emergency department after a fall yesterday.  Patient was leaning down to change her leg bag when she got a little dizzy and fell forward landing on her face and right side.  Has large amount of bruising.  Having facial and head pain, some neck pain, right arm pain and cannot use her fingers.  Complaining of elbow pain.  Also left knee pain.  Patient has not been able to eat since yesterday.      Physical Exam   Triage Vital Signs: ED Triage Vitals  Enc Vitals Group     BP 09/11/22 1320 (!) 147/49     Pulse Rate 09/11/22 1316 (!) 58     Resp 09/11/22 1316 18     Temp 09/11/22 1316 97.7 F (36.5 C)     Temp Source 09/11/22 1316 Oral     SpO2 09/11/22 1316 100 %     Weight 09/11/22 1320 186 lb 1.1 oz (84.4 kg)     Height 09/11/22 1320 5\' 1"  (1.549 m)     Head Circumference --      Peak Flow --      Pain Score 09/11/22 1319 8     Pain Loc --      Pain Edu? --      Excl. in GC? --     Most recent vital signs: Vitals:   09/11/22 1316 09/11/22 1320  BP:  (!) 147/49  Pulse: (!) 58 (!) 52  Resp: 18   Temp: 97.7 F (36.5 C)   SpO2: 100%      General: Awake, no distress.   CV:  Good peripheral perfusion. regular rate and  rhythm Resp:  Normal effort. Lungs cta Abd:  No distention.   Other:  here is a large amount of bruising and swelling noted at the face with bruising around the eyes, scalp is very tender, facial bones are very tender, C-spine is tender, left knee is tender, right hand is grossly bruised swollen tender, right elbow is tender, right humerus is tender   ED Results / Procedures / Treatments   Labs (all labs ordered are listed, but only abnormal results are displayed) Labs  Reviewed  BASIC METABOLIC PANEL - Abnormal; Notable for the following components:      Result Value   CO2 20 (*)    BUN 42 (*)    Creatinine, Ser 2.47 (*)    GFR, Estimated 20 (*)    All other components within normal limits  CBC  URINALYSIS, ROUTINE W REFLEX MICROSCOPIC  CBG MONITORING, ED     EKG  EKG shows sinus bradycardia with first-degree AV block.   RADIOLOGY CT of the head, maxillofacial, cervical spine, x-ray of the right humerus, right forearm and right hand, x-ray of the left knee    PROCEDURES:   Procedures   MEDICATIONS ORDERED IN ED: Medications  acetaminophen (TYLENOL) tablet 650 mg (has no administration in time range)  morphine (PF) 2 MG/ML injection 2 mg (2 mg Intravenous Given 09/11/22 1636)  ondansetron (ZOFRAN) injection 4 mg (4 mg Intravenous Given 09/11/22 1636)     IMPRESSION / MDM / ASSESSMENT AND PLAN / ED COURSE  I reviewed  the triage vital signs and the nursing notes.                              Differential diagnosis includes, but is not limited to, subdural, SAH, orbital fracture, nasal bone fracture, contusion, fracture of the extremity, dehydration, weakness  Patient's presentation is most consistent with acute presentation with potential threat to life or bodily function.   Patient appears to be very bruised, may be some weakness.  She is able to answer my questions.  Daughter is in the room with her that is helping answer all questions.  Labs and imaging ordered, EKG ordered  EKG shows sinus bradycardia with first-degree AV block  CT of the head was independently reviewed and interpreted by me as being negative for any acute abnormality other than a scalp hematoma, I did add on a CT maxillofacial and cervical spine due to my exam  X-ray of the right forearm, right hand, right humerus, and left knee  IV ordered with labs, will give morphine 2 mg IV along with Zofran 4 mg IV   CT of the maxillofacial and C-spine were  independently reviewed interpreted by me as being negative for any acute abnormality  X-ray of the right forearm, right hand, right humerus, and left knee were independently reviewed and interpreted by me as being negative for fracture  Labs appear to be stable per the patient's past medical charts.  I did explain all of the findings to the patient and her daughter.  She was placed in the sling for comfort.  Strict instructions to follow-up with orthopedics if not improving in 1 week.  Return if worsening.  Apply ice to all areas that hurt.  She was given a prescription for tramadol.  She is to also take Tylenol as needed for pain.  They are in agreement treatment plan.  She was discharged stable condition.   FINAL CLINICAL IMPRESSION(S) / ED DIAGNOSES   Final diagnoses:  Fall, initial encounter  Minor head injury, initial encounter  Multiple contusions     Rx / DC Orders   ED Discharge Orders          Ordered    traMADol (ULTRAM) 50 MG tablet  Every 12 hours PRN        09/11/22 1720             Note:  This document was prepared using Dragon voice recognition software and may include unintentional dictation errors.    Faythe Ghee, PA-C 09/11/22 Madelin Headings, MD 09/13/22 [phone]

## 2022-09-11 NOTE — ED Triage Notes (Signed)
Pt here with an unwitnessed fall yesterday. Pt states she got dizzy, hit her head unsure of LOC. Pt has bruising around her eyes and her right arm is swollen. Pt has not eaten since yesterday morning. Right side restrictions from a prior stroke.   50 90-cbg 130/80 98% RA

## 2022-09-18 ENCOUNTER — Other Ambulatory Visit (HOSPITAL_COMMUNITY): Payer: Self-pay

## 2022-09-18 ENCOUNTER — Telehealth: Payer: Self-pay

## 2022-09-18 NOTE — Telephone Encounter (Signed)
Pharmacy Patient Advocate Encounter   Received notification from [facility] that prior authorization for REPATHA is needed.    PA submitted on 09/18/22 Key [id number] Status is pending  Haze Rushing, CPhT Pharmacy Patient Advocate Specialist Direct Number: [phone] Fax: [phone]

## 2022-09-19 ENCOUNTER — Telehealth: Payer: Self-pay

## 2022-09-19 ENCOUNTER — Other Ambulatory Visit (HOSPITAL_COMMUNITY): Payer: Self-pay

## 2022-09-19 NOTE — Telephone Encounter (Signed)
Pharmacy Patient Advocate Encounter   Received notification from [facility] that prior authorization for PRALUENT 75MG /ML is required/requested.   PA submitted on 5.22.254 to (ins) [facility] via CoverMyMeds  Key or (Medicaid) confirmation # [id number]   Status is pending

## 2022-09-19 NOTE — Telephone Encounter (Signed)
Pharmacy Patient Advocate Encounter  Received notification from [facility] that the request for prior authorization for REPATHA has been denied due to PLAN PREFERS PRALUENT PLEASE ADVISE  Haze Rushing, CPhT Pharmacy Patient Advocate Specialist Direct Number: [phone] Fax: [phone]

## 2022-09-26 NOTE — Telephone Encounter (Signed)
Pharmacy Patient Advocate Encounter  Prior Authorization for Praluent 75mg  has been approved by [facility] (ins).    RX Case id# [id number] Effective dates 5.8.24 until further notice

## 2022-09-28 ENCOUNTER — Telehealth: Payer: Self-pay | Admitting: Internal Medicine

## 2022-09-28 NOTE — Telephone Encounter (Signed)
Cristal Deer calling from [facility] is calling to report the HIPAA form but have not received Patient's last OV notes. Please advise CB- [phone] Fax- [phone]

## 2022-10-01 ENCOUNTER — Ambulatory Visit: Payer: Medicare Other | Admitting: Physician Assistant

## 2022-10-01 NOTE — Telephone Encounter (Signed)
Last note faxed

## 2022-10-02 ENCOUNTER — Encounter: Payer: Self-pay | Admitting: Physician Assistant

## 2022-10-02 ENCOUNTER — Ambulatory Visit (INDEPENDENT_AMBULATORY_CARE_PROVIDER_SITE_OTHER): Payer: Medicare Other | Admitting: Physician Assistant

## 2022-10-02 ENCOUNTER — Ambulatory Visit: Payer: Medicare Other | Admitting: Internal Medicine

## 2022-10-02 VITALS — BP 138/77 | HR 48 | Ht 61.0 in | Wt 182.6 lb

## 2022-10-02 DIAGNOSIS — R339 Retention of urine, unspecified: Secondary | ICD-10-CM | POA: Diagnosis not present

## 2022-10-02 DIAGNOSIS — Z435 Encounter for attention to cystostomy: Secondary | ICD-10-CM

## 2022-10-02 NOTE — Patient Instructions (Signed)
Please STOP the Flomax (tamsulosin). I am worried it could be making you dizzy and contributing to falls.

## 2022-10-02 NOTE — Progress Notes (Signed)
Suprapubic Cath Change  Patient is present today for a suprapubic catheter change due to urinary retention.  8ml of water was drained from the balloon, a 16FR Silastic foley cath was removed from the tract with out difficulty.  Site was cleaned and prepped in a sterile fashion with betadine.  A 16FR Silastic foley cath was replaced into the tract no complications were noted. Urine return was noted, 10 ml of sterile water was inflated into the balloon and a leg bag was attached for drainage.  Patient tolerated well.     Performed by: Carman Ching, PA-C   Additional notes: She was seen in the ED last month after a fall. She reports dizziness when bending over to use the bathroom. Given unsuccessful recent clamping trial, I recommend stopping Flomax in case this is contributory to her dizziness. They are in agreement with this plan.  Follow up: Return in about 4 weeks (around 10/30/2022) for SPT exchange.

## 2022-10-12 ENCOUNTER — Ambulatory Visit (INDEPENDENT_AMBULATORY_CARE_PROVIDER_SITE_OTHER): Payer: Medicare Other

## 2022-10-12 VITALS — Ht 61.0 in | Wt 182.0 lb

## 2022-10-12 DIAGNOSIS — Z0000 Encounter for general adult medical examination without abnormal findings: Secondary | ICD-10-CM

## 2022-10-12 DIAGNOSIS — Z1231 Encounter for screening mammogram for malignant neoplasm of breast: Secondary | ICD-10-CM

## 2022-10-12 NOTE — Patient Instructions (Signed)
[name] , Thank you for taking time to come for your Medicare Wellness Visit. I appreciate your ongoing commitment to your health goals. Please review the following plan we discussed and let me know if I can assist you in the future.   These are the goals we discussed:  Goals      DIET - INCREASE WATER INTAKE     Recommend drinking 6-8 glasses of water per day     Find Help in My Community     Timeframe:  Long-Range Goal Priority:  Medium Start Date:      07/07/21                       Expected End Date:    01/07/22                   Follow Up Date 10/20/21   - begin a notebook of services in my neighborhood or community - call 211 when I need some help - follow-up on any referrals for help I am given - think ahead to make sure my need does not become an emergency - make a list of family or friends that I can call  -call 911 in case of an emergency or if you are feeling unsafe    Why is this important?   Knowing how and where to find help for yourself or family in your neighborhood and community is an important skill.  You will want to take some steps to learn how.    Notes:         This is a list of the screening recommended for you and due dates:  Health Maintenance  Topic Date Due   DTaP/Tdap/Td vaccine (1 - Tdap) Never done   Cologuard (Stool DNA test)  Never done   Zoster (Shingles) Vaccine (1 of 2) Never done   Mammogram  02/13/2019   COVID-19 Vaccine (2 - 2023-24 season) 12/29/2021   Flu Shot  11/29/2022   Yearly kidney health urinalysis for diabetes  05/10/2023   Yearly kidney function blood test for diabetes  09/11/2023   Medicare Annual Wellness Visit  10/12/2023   Pneumonia Vaccine  Completed   DEXA scan (bone density measurement)  Completed   Hepatitis C Screening  Completed   HPV Vaccine  Aged Out    Advanced directives: no  Conditions/risks identified: moderate fall risk  Next appointment: Follow up in one year for your annual wellness visit  10/24/2023 @ 3:30pm telephone   Preventive Care 65 Years and Older, Female Preventive care refers to lifestyle choices and visits with your health care provider that can promote health and wellness. What does preventive care include? A yearly physical exam. This is also called an annual well check. Dental exams once or twice a year. Routine eye exams. Ask your health care provider how often you should have your eyes checked. Personal lifestyle choices, including: Daily care of your teeth and gums. Regular physical activity. Eating a healthy diet. Avoiding tobacco and drug use. Limiting alcohol use. Practicing safe sex. Taking low-dose aspirin every day. Taking vitamin and mineral supplements as recommended by your health care provider. What happens during an annual well check? The services and screenings done by your health care provider during your annual well check will depend on your age, overall health, lifestyle risk factors, and family history of disease. Counseling  Your health care provider may ask you questions about your: Alcohol use. Tobacco use. Drug  use. Emotional well-being. Home and relationship well-being. Sexual activity. Eating habits. History of falls. Memory and ability to understand (cognition). Work and work Statistician. Reproductive health. Screening  You may have the following tests or measurements: Height, weight, and BMI. Blood pressure. Lipid and cholesterol levels. These may be checked every 5 years, or more frequently if you are over 79 years old. Skin check. Lung cancer screening. You may have this screening every year starting at age 43 if you have a 30-pack-year history of smoking and currently smoke or have quit within the past 15 years. Fecal occult blood test (FOBT) of the stool. You may have this test every year starting at age 52. Flexible sigmoidoscopy or colonoscopy. You may have a sigmoidoscopy every 5 years or a colonoscopy every 10  years starting at age 17. Hepatitis C blood test. Hepatitis B blood test. Sexually transmitted disease (STD) testing. Diabetes screening. This is done by checking your blood sugar (glucose) after you have not eaten for a while (fasting). You may have this done every 1-3 years. Bone density scan. This is done to screen for osteoporosis. You may have this done starting at age 69. Mammogram. This may be done every 1-2 years. Talk to your health care provider about how often you should have regular mammograms. Talk with your health care provider about your test results, treatment options, and if necessary, the need for more tests. Vaccines  Your health care provider may recommend certain vaccines, such as: Influenza vaccine. This is recommended every year. Tetanus, diphtheria, and acellular pertussis (Tdap, Td) vaccine. You may need a Td booster every 10 years. Zoster vaccine. You may need this after age 66. Pneumococcal 13-valent conjugate (PCV13) vaccine. One dose is recommended after age 73. Pneumococcal polysaccharide (PPSV23) vaccine. One dose is recommended after age 4. Talk to your health care provider about which screenings and vaccines you need and how often you need them. This information is not intended to replace advice given to you by your health care provider. Make sure you discuss any questions you have with your health care provider. Document Released: 05/13/2015 Document Revised: 01/04/2016 Document Reviewed: 02/15/2015 Elsevier Interactive Patient Education  2017 [place] Prevention in the Home Falls can cause injuries. They can happen to people of all ages. There are many things you can do to make your home safe and to help prevent falls. What can I do on the outside of my home? Regularly fix the edges of walkways and driveways and fix any cracks. Remove anything that might make you trip as you walk through a door, such as a raised step or threshold. Trim any  bushes or trees on the path to your home. Use bright outdoor lighting. Clear any walking paths of anything that might make someone trip, such as rocks or tools. Regularly check to see if handrails are loose or broken. Make sure that both sides of any steps have handrails. Any raised decks and porches should have guardrails on the edges. Have any leaves, snow, or ice cleared regularly. Use sand or salt on walking paths during winter. Clean up any spills in your garage right away. This includes oil or grease spills. What can I do in the bathroom? Use night lights. Install grab bars by the toilet and in the tub and shower. Do not use towel bars as grab bars. Use non-skid mats or decals in the tub or shower. If you need to sit down in the shower, use a plastic, non-slip  stool. Keep the floor dry. Clean up any water that spills on the floor as soon as it happens. Remove soap buildup in the tub or shower regularly. Attach bath mats securely with double-sided non-slip rug tape. Do not have throw rugs and other things on the floor that can make you trip. What can I do in the bedroom? Use night lights. Make sure that you have a light by your bed that is easy to reach. Do not use any sheets or blankets that are too big for your bed. They should not hang down onto the floor. Have a firm chair that has side arms. You can use this for support while you get dressed. Do not have throw rugs and other things on the floor that can make you trip. What can I do in the kitchen? Clean up any spills right away. Avoid walking on wet floors. Keep items that you use a lot in easy-to-reach places. If you need to reach something above you, use a strong step stool that has a grab bar. Keep electrical cords out of the way. Do not use floor polish or wax that makes floors slippery. If you must use wax, use non-skid floor wax. Do not have throw rugs and other things on the floor that can make you trip. What can I do  with my stairs? Do not leave any items on the stairs. Make sure that there are handrails on both sides of the stairs and use them. Fix handrails that are broken or loose. Make sure that handrails are as long as the stairways. Check any carpeting to make sure that it is firmly attached to the stairs. Fix any carpet that is loose or worn. Avoid having throw rugs at the top or bottom of the stairs. If you do have throw rugs, attach them to the floor with carpet tape. Make sure that you have a light switch at the top of the stairs and the bottom of the stairs. If you do not have them, ask someone to add them for you. What else can I do to help prevent falls? Wear shoes that: Do not have high heels. Have rubber bottoms. Are comfortable and fit you well. Are closed at the toe. Do not wear sandals. If you use a stepladder: Make sure that it is fully opened. Do not climb a closed stepladder. Make sure that both sides of the stepladder are locked into place. Ask someone to hold it for you, if possible. Clearly mark and make sure that you can see: Any grab bars or handrails. First and last steps. Where the edge of each step is. Use tools that help you move around (mobility aids) if they are needed. These include: Canes. Walkers. Scooters. Crutches. Turn on the lights when you go into a dark area. Replace any light bulbs as soon as they burn out. Set up your furniture so you have a clear path. Avoid moving your furniture around. If any of your floors are uneven, fix them. If there are any pets around you, be aware of where they are. Review your medicines with your doctor. Some medicines can make you feel dizzy. This can increase your chance of falling. Ask your doctor what other things that you can do to help prevent falls. This information is not intended to replace advice given to you by your health care provider. Make sure you discuss any questions you have with your health care  provider. Document Released: 02/10/2009 Document Revised: 09/22/2015 Document Reviewed:  05/21/2014 Elsevier Interactive Patient Education  2017 [employer].

## 2022-10-12 NOTE — Progress Notes (Signed)
Subjective:   Leslie Houston is a 72 y.o. female who presents for Medicare Annual (Subsequent) preventive examination.  Review of Systems    Cardiac Risk Factors include: advanced age (>23men, >54 women);dyslipidemia;hypertension;obesity (BMI >30kg/m2);sedentary lifestyle    Objective:    Today's Vitals   10/12/22 1455  Weight: 182 lb (82.6 kg)  Height: 5\' 1"  (1.549 m)   Body mass index is 34.39 kg/m.     10/12/2022    3:10 PM 09/11/2022    1:21 PM 09/02/2022    3:01 PM 04/30/2022    8:00 AM 04/28/2022    8:46 PM 03/24/2022    6:00 PM 03/14/2022    2:00 PM  Advanced Directives  Does Patient Have a Medical Advance Directive? No No Yes No No Yes Yes  Type of Surveyor, minerals;Living will    Healthcare Power of Attorney  Does patient want to make changes to medical advance directive?       No - Patient declined  Copy of Healthcare Power of Attorney in Chart?       No - copy requested  Would patient like information on creating a medical advance directive?  No - Patient declined  No - Patient declined       Current Medications (verified) Outpatient Encounter Medications as of 10/12/2022  Medication Sig   aluminum-magnesium hydroxide-simethicone (MAALOX) 200-200-20 MG/5ML SUSP Take 30 mLs by mouth 4 (four) times daily -  before meals and at bedtime.   benzonatate (TESSALON) 100 MG capsule Take 1 capsule (100 mg total) by mouth 2 (two) times daily as needed for cough. (Patient not taking: Reported on 10/12/2022)   Cholecalciferol (VITAMIN D3) 25 MCG (1000 UT) CAPS Take 1 capsule by mouth daily.   citalopram (CELEXA) 20 MG tablet TAKE 1 TABLET BY MOUTH ONCE A DAY   Evolocumab (REPATHA SURECLICK) 140 MG/ML SOAJ Inject 140 mg into the skin every 14 (fourteen) days.   feeding supplement (ENSURE ENLIVE / ENSURE PLUS) LIQD Take 237 mLs by mouth 2 (two) times daily between meals.   ferrous sulfate 325 (65 FE) MG tablet Take 325 mg by mouth daily with  breakfast.   fluticasone (FLONASE) 50 MCG/ACT nasal spray Place 2 sprays into both nostrils daily as needed for allergies (allergies).    guaiFENesin (MUCINEX) 600 MG 12 hr tablet Take 1 tablet (600 mg total) by mouth daily. (Patient not taking: Reported on 10/12/2022)   hydrALAZINE (APRESOLINE) 10 MG tablet Take 10 mg by mouth daily.   levETIRAcetam (KEPPRA) 1000 MG tablet Take 1 tablet (1,000 mg total) by mouth 2 (two) times daily.   loratadine (CLARITIN) 10 MG tablet TAKE 1 TABLET BY MOUTH ONCE A DAY AS NEEDED FOR ALLERGIES   metoCLOPramide (REGLAN) 10 MG tablet Take 1 tablet (10 mg total) by mouth every 6 (six) hours as needed.   Multiple Vitamin (MULTIVITAMIN WITH MINERALS) TABS tablet Take 1 tablet by mouth daily.   nystatin (MYCOSTATIN/NYSTOP) powder Apply 1 Application topically 3 (three) times daily.   ondansetron (ZOFRAN-ODT) 4 MG disintegrating tablet Take 1 tablet (4 mg total) by mouth every 8 (eight) hours as needed.   pantoprazole (PROTONIX) 20 MG tablet TAKE 1 TABLET BY MOUTH ONCE A DAY   polyethylene glycol (MIRALAX / GLYCOLAX) 17 g packet Take 17 g by mouth daily.   torsemide (DEMADEX) 20 MG tablet Take 1 tablet (20 mg total) by mouth daily.   traMADol (ULTRAM) 50 MG tablet Take 1 tablet (50 mg  total) by mouth every 12 (twelve) hours as needed.   No facility-administered encounter medications on file as of 10/12/2022.    Allergies (verified) Bactrim [sulfamethoxazole-trimethoprim], Latex, Penicillin g, Atorvastatin, Other, Amlodipine, and Doxycycline   History: Past Medical History:  Diagnosis Date   (HFpEF) heart failure with preserved ejection fraction (HCC)    a. Pt reports in 2000 she had CHF, details unclear, outside hospital; b. 06/2019 Echo: EF 50-55%, no rwma, Gr2 DD,  mildly dil LA, mild MR.   Acute left PCA stroke (HCC) 06/22/2014   Anxiety    a.) on BZO (alprazolam) PRN   CAD (coronary artery disease)    a. Pt reports in 2000 she had a stent to a coronary  artery, details unclear, outside hospital.   Carotid stenosis    a. CT angio head 07/2014 - 50-60% stenoses of prox ICA bilaterally.   CKD (chronic kidney disease), stage IV (HCC)    Depression    Diabetes mellitus (HCC)    Denies.   Edentulous    No upper teeth   Family hx of colon cancer 06/13/2018   Father diagnosed in his 3's   Gout    History of cataract extraction, left 03/21/2021   HOH (hard of hearing)    usually needs to read lips   Hyperlipidemia    Hypertension    Intellectual disability    a.) born premature, limited formal education, has never driven   Myalgia due to statin    Orthostatic hypotension    OSA (obstructive sleep apnea)    a.) not on nocturnal PAP therapy; old/nonfuntioning DME - referred for repeat PSG 01/2022 by neurology   Osteopenia 01/24/2018   DEXA Sept 2019   PONV (postoperative nausea and vomiting)    PTSD (post-traumatic stress disorder)        PUD (peptic ulcer disease)    PVC's (premature ventricular contractions)    Right sided weakness 05/2014   a.) s/p CVA   Seizures (HCC)    Sinus bradycardia    Syncope    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)    Past Surgical History:  Procedure Laterality Date   BLADDER SURGERY     CATARACT EXTRACTION W/PHACO Left 03/21/2021   Procedure: CATARACT EXTRACTION PHACO AND INTRAOCULAR LENS PLACEMENT (IOC) left 5.86 00:38.0;  Surgeon: Galen Manila, MD;  Location: [facility];  Service: Ophthalmology;  Laterality: Left;  Latex   CESAREAN SECTION     CORONARY ANGIOPLASTY WITH STENT PLACEMENT     CYSTOSCOPY N/A 03/12/2022   Procedure: CYSTOSCOPY;  Surgeon: Vanna Scotland, MD;  Location: [facility];  Service: Urology;  Laterality: N/A;   DENTAL SURGERY     INSERTION OF SUPRAPUBIC CATHETER N/A 03/12/2022   Procedure: INSERTION OF SUPRAPUBIC CATHETER;  Surgeon: Vanna Scotland, MD;  Location: [facility];  Service: Urology;  Laterality: N/A;   LAPAROTOMY N/A 03/22/2022    Procedure: EXPLORATORY LAPAROTOMY; LYSIS OF ADHESIONS;  Surgeon: Sung Amabile, DO;  Location: [facility];  Service: General;  Laterality: N/A;   RECTAL EXAM UNDER ANESTHESIA N/A 03/12/2022   Procedure: PELVIC EXAM UNDER ANESTHESIA;  Surgeon: Vanna Scotland, MD;  Location: [facility];  Service: Urology;  Laterality: N/A;   Family History  Problem Relation Age of Onset   Stroke Mother    Heart attack Mother    Lung cancer Mother    Stroke Father    Prostate cancer Father    Colon cancer Father    Diabetes Mellitus II Daughter  Multiple sclerosis Daughter    Kidney disease Son    Heart failure Son    Cancer Paternal Grandfather    Social History   Socioeconomic History   Marital status: Single    Spouse name: Not on file   Number of children: 2   Years of education: 12   Highest education level: Not on file  Occupational History   Occupation: Disabled  Tobacco Use   Smoking status: Former    Types: Cigarettes    Quit date: 03/30/2014    Years since quitting: 8.5    Passive exposure: Past   Smokeless tobacco: Never  Vaping Use   Vaping Use: Never used  Substance and Sexual Activity   Alcohol use: No   Drug use: No   Sexual activity: Not Currently  Other Topics Concern   Not on file  Social History Narrative   Lives at home with her son's father.   Right-handed.   1 cup coffee per day.   Social Determinants of Health   Financial Resource Strain: Low Risk  (10/12/2022)   Overall Financial Resource Strain (CARDIA)    Difficulty of Paying Living Expenses: Not hard at all  Food Insecurity: Food Insecurity Present (10/12/2022)   Hunger Vital Sign    Worried About Running Out of Food in the Last Year: Never true    Ran Out of Food in the Last Year: Sometimes true  Transportation Needs: No Transportation Needs (10/12/2022)   PRAPARE - Administrator, Civil Service (Medical): No    Lack of Transportation (Non-Medical): No  Physical Activity: Insufficiently Active  (10/12/2022)   Exercise Vital Sign    Days of Exercise per Week: 4 days    Minutes of Exercise per Session: 30 min  Stress: No Stress Concern Present (10/10/2021)   [employer] of Occupational Health - Occupational Stress Questionnaire    Feeling of Stress : Only a little  Social Connections: Socially Isolated (10/12/2022)   Social Connection and Isolation Panel [NHANES]    Frequency of Communication with Friends and Family: More than three times a week    Frequency of Social Gatherings with Friends and Family: Three times a week    Attends Religious Services: Never    Active Member of Clubs or Organizations: No    Attends Engineer, structural: Never    Marital Status: Divorced    Tobacco Counseling Counseling given: Not Answered   Clinical Intake:  Pre-visit preparation completed: Yes  Pain : No/denies pain     BMI - recorded: 34.39 Nutritional Status: BMI > 30  Obese Nutritional Risks: None Diabetes: No  How often do you need to have someone help you when you read instructions, pamphlets, or other written materials from your doctor or pharmacy?: 1 - Never  Diabetic?no  Interpreter Needed?: No  Comments: lives alone Information entered by :: B.Trask Vosler,LPN   Activities of Daily Living    10/12/2022    3:10 PM 07/02/2022    2:20 PM  In your present state of health, do you have any difficulty performing the following activities:  Hearing? 1 0  Vision? 0 0  Difficulty concentrating or making decisions? 0 1  Walking or climbing stairs? 1 1  Dressing or bathing? 0 0  Doing errands, shopping? 1 1  Preparing Food and eating ? N   Using the Toilet? N   In the past six months, have you accidently leaked urine? N   Comment has foley   Do  you have problems with loss of bowel control? N   Managing your Medications? N   Managing your Finances? N   Housekeeping or managing your Housekeeping? Y     Patient Care Team: [name], DO as PCP -  General (Internal Medicine) Antonieta Iba, MD as PCP - Cardiology (Cardiology) Vanna Scotland, MD as Consulting Physician (Urology) Mosetta Pigeon, MD (Nephrology)  Indicate any recent Medical Services you may have received from other than [facility] providers in the past year (date may be approximate).     Assessment:   This is a routine wellness examination for [place].  Hearing/Vision screen Hearing Screening - Comments:: Adequate hearing with one hearing aide Vision Screening - Comments:: Adequate vision w/glasses [facility]  Dietary issues and exercise activities discussed: Current Exercise Habits: The patient does not participate in regular exercise at present, Exercise limited by: neurologic condition(s);cardiac condition(s)   Goals Addressed             This Visit's Progress    DIET - INCREASE WATER INTAKE   On track    Recommend drinking 6-8 glasses of water per day       Depression Screen    10/12/2022    3:05 PM 07/02/2022    2:19 PM 05/09/2022   11:12 AM 05/03/2022    1:14 PM 12/05/2021    1:16 PM 10/10/2021    2:18 PM 10/10/2021    1:48 PM  PHQ 2/9 Scores  PHQ - 2 Score 0 0 1 0 2 2 2   PHQ- 9 Score  0 1 0 8 9 9     Fall Risk    10/12/2022    2:59 PM 07/02/2022    2:19 PM 05/09/2022   11:11 AM 05/03/2022    1:13 PM 12/05/2021    1:14 PM  Fall Risk   Falls in the past year? 1 0 0 0 0  Number falls in past yr: 0 0 0 0 0  Injury with Fall? 1 0 0 0 0  Risk for fall due to : Impaired balance/gait;Impaired mobility Impaired balance/gait Impaired balance/gait;Impaired mobility    Follow up Education provided;Falls prevention discussed Falls prevention discussed  Falls evaluation completed     FALL RISK PREVENTION PERTAINING TO THE HOME:  Any stairs in or around the home? No  If so, are there any without handrails? No  Home free of loose throw rugs in walkways, pet beds, electrical cords, etc? Yes  Adequate lighting in your home to reduce risk of falls? Yes    ASSISTIVE DEVICES UTILIZED TO PREVENT FALLS:  Life alert? Yes  Use of a cane, walker or w/c? Yes  Grab bars in the bathroom? Yes  Shower chair or bench in shower? Yes  Elevated toilet seat or a handicapped toilet? Yes   Cognitive Function:        10/12/2022    3:17 PM  6CIT Screen  What Year? 0 points  What month? 0 points  What time? 0 points  Count back from 20 0 points  Months in reverse 2 points  Repeat phrase 2 points  Total Score 4 points    Immunizations Immunization History  Administered Date(s) Administered   Fluad Quad(high Dose 65+) 01/22/2019, 03/04/2020   Influenza-Unspecified 01/28/2021   Moderna Sars-Covid-2 Vaccination 01/26/2020   Pneumococcal Conjugate-13 04/18/2016   Pneumococcal Polysaccharide-23 01/22/2019    TDAP status: Up to date  Flu Vaccine status: Declined, Education has been provided regarding the importance of this vaccine but patient still  declined. Advised may receive this vaccine at local pharmacy or Health Dept. Aware to provide a copy of the vaccination record if obtained from local pharmacy or Health Dept. Verbalized acceptance and understanding.  Pneumococcal vaccine status: Up to date  Covid-19 vaccine status: Completed vaccines  Qualifies for Shingles Vaccine? Yes   Zostavax completed No   Shingrix Completed?: No.    Education has been provided regarding the importance of this vaccine. Patient has been advised to call insurance company to determine out of pocket expense if they have not yet received this vaccine. Advised may also receive vaccine at local pharmacy or Health Dept. Verbalized acceptance and understanding.  Screening Tests Health Maintenance  Topic Date Due   DTaP/Tdap/Td (1 - Tdap) Never done   Fecal DNA (Cologuard)  Never done   Zoster Vaccines- Shingrix (1 of 2) Never done   MAMMOGRAM  02/13/2019   COVID-19 Vaccine (2 - 2023-24 season) 12/29/2021   INFLUENZA VACCINE  11/29/2022   Diabetic kidney evaluation  - Urine ACR  05/10/2023   Diabetic kidney evaluation - eGFR measurement  09/11/2023   Medicare Annual Wellness (AWV)  10/12/2023   Pneumonia Vaccine 12+ Years old  Completed   DEXA SCAN  Completed   Hepatitis C Screening  Completed   HPV VACCINES  Aged Out    Health Maintenance  Health Maintenance Due  Topic Date Due   DTaP/Tdap/Td (1 - Tdap) Never done   Fecal DNA (Cologuard)  Never done   Zoster Vaccines- Shingrix (1 of 2) Never done   MAMMOGRAM  02/13/2019   COVID-19 Vaccine (2 - 2023-24 season) 12/29/2021    Colorectal cancer screening: Type of screening: Cologuard. Completed yes. Repeat every 3 years  Mammogram status: Ordered yes. Pt provided with contact info and advised to call to schedule appt.   Bone Density status: Completed yes. Results reflect: Bone density results: OSTEOPENIA. Repeat every 3 years.  Lung Cancer Screening: (Low Dose CT Chest recommended if Age 20-80 years, 30 pack-year currently smoking OR have quit w/in 15years.) does not qualify.   Lung Cancer Screening Referral: no  Additional Screening:  Hepatitis C Screening: does not qualify; Completed yes   Vision Screening: Recommended annual ophthalmology exams for early detection of glaucoma and other disorders of the eye. Is the patient up to date with their annual eye exam?  Yes  Who is the provider or what is the name of the office in which the patient attends annual eye exams? [facility] If pt is not established with a provider, would they like to be referred to a provider to establish care? No .   Dental Screening: Recommended annual dental exams for proper oral hygiene  Community Resource Referral / Chronic Care Management: CRR required this visit?  No   CCM required this visit?  No      Plan:     I have personally reviewed and noted the following in the patient's chart:   Medical and social history Use of alcohol, tobacco or illicit drugs  Current medications and supplements  including opioid prescriptions. Patient is not currently taking opioid prescriptions. Functional ability and status Nutritional status Physical activity Advanced directives List of other physicians Hospitalizations, surgeries, and ER visits in previous 12 months Vitals Screenings to include cognitive, depression, and falls Referrals and appointments  In addition, I have reviewed and discussed with patient certain preventive protocols, quality metrics, and best practice recommendations. A written personalized care plan for preventive services as well as general preventive  health recommendations were provided to patient.     Sue Lush, LPN   1/61/0960   Nurse Notes: pt states she is doing alright. She lives alone and says her brother's girlfriend comes and cleans for her and helps her. Pt has no concerns, just wants to know if she can drink prune juice.

## 2022-10-15 MED ORDER — PRALUENT 150 MG/ML ~~LOC~~ SOAJ: 150.0000 mg | SUBCUTANEOUS | 2 refills | Status: DC

## 2022-10-15 NOTE — Telephone Encounter (Signed)
Made daughter aware of Praluent approval and it is replacement of Repatha

## 2022-10-16 ENCOUNTER — Ambulatory Visit: Payer: Medicare Other | Admitting: Internal Medicine

## 2022-11-02 ENCOUNTER — Ambulatory Visit: Payer: Medicare Other | Admitting: Physician Assistant

## 2022-11-05 ENCOUNTER — Ambulatory Visit: Payer: Medicare Other

## 2022-11-12 ENCOUNTER — Encounter: Payer: Self-pay | Admitting: Physician Assistant

## 2022-11-12 ENCOUNTER — Ambulatory Visit (INDEPENDENT_AMBULATORY_CARE_PROVIDER_SITE_OTHER): Payer: Medicare Other | Admitting: Physician Assistant

## 2022-11-12 VITALS — BP 121/73 | HR 59 | Wt 188.0 lb

## 2022-11-12 DIAGNOSIS — R339 Retention of urine, unspecified: Secondary | ICD-10-CM

## 2022-11-12 DIAGNOSIS — Z435 Encounter for attention to cystostomy: Secondary | ICD-10-CM

## 2022-11-12 NOTE — Progress Notes (Signed)
Suprapubic Cath Change  Patient is present today for a suprapubic catheter change due to urinary retention.  8ml of water was drained from the balloon, a 16FR Silsatic foley cath was removed from the tract without difficulty.  Site was cleaned and prepped in a sterile fashion with betadine.  A 16FR Silastic foley cath was replaced into the tract no complications were noted. Urine return was noted, 10 ml of sterile water was inflated into the balloon and a leg bag was attached for drainage.  Patient tolerated well.     Performed by: Carman Ching, PA-C   Follow up: Return in about 4 weeks (around 12/10/2022) for SPT exchange.

## 2022-11-13 ENCOUNTER — Encounter: Payer: Self-pay | Admitting: Emergency Medicine

## 2022-11-13 ENCOUNTER — Inpatient Hospital Stay
Admission: EM | Admit: 2022-11-13 | Discharge: 2022-11-17 | DRG: 300 | Disposition: A | Discharge status: Home Health | Payer: Medicare Other | Attending: Internal Medicine | Admitting: Internal Medicine

## 2022-11-13 ENCOUNTER — Emergency Department: Payer: Medicare Other

## 2022-11-13 ENCOUNTER — Other Ambulatory Visit: Payer: Self-pay

## 2022-11-13 ENCOUNTER — Observation Stay: Payer: Medicare Other

## 2022-11-13 DIAGNOSIS — Z955 Presence of coronary angioplasty implant and graft: Secondary | ICD-10-CM | POA: Diagnosis not present

## 2022-11-13 DIAGNOSIS — I10 Essential (primary) hypertension: Secondary | ICD-10-CM | POA: Diagnosis not present

## 2022-11-13 DIAGNOSIS — Z888 Allergy status to other drugs, medicaments and biological substances status: Secondary | ICD-10-CM

## 2022-11-13 DIAGNOSIS — Y846 Urinary catheterization as the cause of abnormal reaction of the patient, or of later complication, without mention of misadventure at the time of the procedure: Secondary | ICD-10-CM | POA: Diagnosis present

## 2022-11-13 DIAGNOSIS — M109 Gout, unspecified: Secondary | ICD-10-CM | POA: Diagnosis present

## 2022-11-13 DIAGNOSIS — Z9109 Other allergy status, other than to drugs and biological substances: Secondary | ICD-10-CM

## 2022-11-13 DIAGNOSIS — Z881 Allergy status to other antibiotic agents status: Secondary | ICD-10-CM

## 2022-11-13 DIAGNOSIS — R519 Headache, unspecified: Secondary | ICD-10-CM | POA: Diagnosis not present

## 2022-11-13 DIAGNOSIS — I2510 Atherosclerotic heart disease of native coronary artery without angina pectoris: Secondary | ICD-10-CM | POA: Diagnosis not present

## 2022-11-13 DIAGNOSIS — E785 Hyperlipidemia, unspecified: Secondary | ICD-10-CM | POA: Diagnosis not present

## 2022-11-13 DIAGNOSIS — R29898 Other symptoms and signs involving the musculoskeletal system: Secondary | ICD-10-CM

## 2022-11-13 DIAGNOSIS — I130 Hypertensive heart and chronic kidney disease with heart failure and stage 1 through stage 4 chronic kidney disease, or unspecified chronic kidney disease: Secondary | ICD-10-CM | POA: Diagnosis not present

## 2022-11-13 DIAGNOSIS — M79651 Pain in right thigh: Secondary | ICD-10-CM | POA: Diagnosis not present

## 2022-11-13 DIAGNOSIS — M79604 Pain in right leg: Secondary | ICD-10-CM | POA: Diagnosis not present

## 2022-11-13 DIAGNOSIS — I82442 Acute embolism and thrombosis of left tibial vein: Secondary | ICD-10-CM | POA: Diagnosis not present

## 2022-11-13 DIAGNOSIS — G40909 Epilepsy, unspecified, not intractable, without status epilepticus: Secondary | ICD-10-CM | POA: Diagnosis present

## 2022-11-13 DIAGNOSIS — F32A Depression, unspecified: Secondary | ICD-10-CM | POA: Diagnosis present

## 2022-11-13 DIAGNOSIS — R1111 Vomiting without nausea: Secondary | ICD-10-CM | POA: Diagnosis not present

## 2022-11-13 DIAGNOSIS — F419 Anxiety disorder, unspecified: Secondary | ICD-10-CM | POA: Diagnosis present

## 2022-11-13 DIAGNOSIS — I959 Hypotension, unspecified: Secondary | ICD-10-CM | POA: Diagnosis not present

## 2022-11-13 DIAGNOSIS — R1084 Generalized abdominal pain: Secondary | ICD-10-CM | POA: Diagnosis not present

## 2022-11-13 DIAGNOSIS — N184 Chronic kidney disease, stage 4 (severe): Secondary | ICD-10-CM | POA: Diagnosis not present

## 2022-11-13 DIAGNOSIS — Z823 Family history of stroke: Secondary | ICD-10-CM | POA: Diagnosis not present

## 2022-11-13 DIAGNOSIS — T83518A Infection and inflammatory reaction due to other urinary catheter, initial encounter: Secondary | ICD-10-CM | POA: Diagnosis not present

## 2022-11-13 DIAGNOSIS — Z79899 Other long term (current) drug therapy: Secondary | ICD-10-CM

## 2022-11-13 DIAGNOSIS — K219 Gastro-esophageal reflux disease without esophagitis: Secondary | ICD-10-CM | POA: Diagnosis present

## 2022-11-13 DIAGNOSIS — N390 Urinary tract infection, site not specified: Secondary | ICD-10-CM | POA: Diagnosis present

## 2022-11-13 DIAGNOSIS — I82409 Acute embolism and thrombosis of unspecified deep veins of unspecified lower extremity: Secondary | ICD-10-CM | POA: Diagnosis present

## 2022-11-13 DIAGNOSIS — Z8249 Family history of ischemic heart disease and other diseases of the circulatory system: Secondary | ICD-10-CM | POA: Diagnosis not present

## 2022-11-13 DIAGNOSIS — Z880 Allergy status to penicillin: Secondary | ICD-10-CM

## 2022-11-13 DIAGNOSIS — F4310 Post-traumatic stress disorder, unspecified: Secondary | ICD-10-CM | POA: Diagnosis not present

## 2022-11-13 DIAGNOSIS — K6389 Other specified diseases of intestine: Secondary | ICD-10-CM | POA: Diagnosis not present

## 2022-11-13 DIAGNOSIS — Z87891 Personal history of nicotine dependence: Secondary | ICD-10-CM | POA: Diagnosis not present

## 2022-11-13 DIAGNOSIS — Z87440 Personal history of urinary (tract) infections: Secondary | ICD-10-CM | POA: Diagnosis not present

## 2022-11-13 DIAGNOSIS — Z91040 Latex allergy status: Secondary | ICD-10-CM

## 2022-11-13 DIAGNOSIS — N2889 Other specified disorders of kidney and ureter: Secondary | ICD-10-CM | POA: Diagnosis not present

## 2022-11-13 DIAGNOSIS — M1611 Unilateral primary osteoarthritis, right hip: Secondary | ICD-10-CM | POA: Diagnosis not present

## 2022-11-13 DIAGNOSIS — Z8673 Personal history of transient ischemic attack (TIA), and cerebral infarction without residual deficits: Secondary | ICD-10-CM | POA: Diagnosis not present

## 2022-11-13 DIAGNOSIS — R339 Retention of urine, unspecified: Secondary | ICD-10-CM | POA: Diagnosis present

## 2022-11-13 DIAGNOSIS — Z8711 Personal history of peptic ulcer disease: Secondary | ICD-10-CM

## 2022-11-13 DIAGNOSIS — Z882 Allergy status to sulfonamides status: Secondary | ICD-10-CM

## 2022-11-13 DIAGNOSIS — M1711 Unilateral primary osteoarthritis, right knee: Secondary | ICD-10-CM | POA: Diagnosis not present

## 2022-11-13 DIAGNOSIS — K5900 Constipation, unspecified: Secondary | ICD-10-CM | POA: Diagnosis not present

## 2022-11-13 DIAGNOSIS — E1122 Type 2 diabetes mellitus with diabetic chronic kidney disease: Secondary | ICD-10-CM | POA: Diagnosis present

## 2022-11-13 DIAGNOSIS — I5032 Chronic diastolic (congestive) heart failure: Secondary | ICD-10-CM | POA: Diagnosis present

## 2022-11-13 DIAGNOSIS — E119 Type 2 diabetes mellitus without complications: Secondary | ICD-10-CM

## 2022-11-13 DIAGNOSIS — R0789 Other chest pain: Secondary | ICD-10-CM | POA: Diagnosis not present

## 2022-11-13 DIAGNOSIS — R109 Unspecified abdominal pain: Secondary | ICD-10-CM | POA: Diagnosis not present

## 2022-11-13 DIAGNOSIS — G4733 Obstructive sleep apnea (adult) (pediatric): Secondary | ICD-10-CM | POA: Diagnosis not present

## 2022-11-13 DIAGNOSIS — G4489 Other headache syndrome: Secondary | ICD-10-CM | POA: Diagnosis not present

## 2022-11-13 DIAGNOSIS — Z6835 Body mass index (BMI) 35.0-35.9, adult: Secondary | ICD-10-CM

## 2022-11-13 DIAGNOSIS — F79 Unspecified intellectual disabilities: Secondary | ICD-10-CM | POA: Diagnosis present

## 2022-11-13 DIAGNOSIS — M8580 Other specified disorders of bone density and structure, unspecified site: Secondary | ICD-10-CM | POA: Diagnosis present

## 2022-11-13 DIAGNOSIS — R531 Weakness: Secondary | ICD-10-CM | POA: Diagnosis not present

## 2022-11-13 DIAGNOSIS — M79659 Pain in unspecified thigh: Secondary | ICD-10-CM | POA: Diagnosis present

## 2022-11-13 LAB — URINALYSIS, ROUTINE W REFLEX MICROSCOPIC
Bilirubin Urine: NEGATIVE
Glucose, UA: NEGATIVE mg/dL
Ketones, ur: NEGATIVE mg/dL
Nitrite: POSITIVE — AB
Protein, ur: 100 mg/dL — AB
Specific Gravity, Urine: 1.011 (ref 1.005–1.030)
WBC, UA: >50 WBC/hpf (ref 0–5)
pH: 6 (ref 5.0–8.0)

## 2022-11-13 LAB — BASIC METABOLIC PANEL
Anion gap: 9 (ref 5–15)
BUN: 38 mg/dL — ABNORMAL HIGH (ref 8–23)
CO2: 21 mmol/L — ABNORMAL LOW (ref 22–32)
Calcium: 9.1 mg/dL (ref 8.9–10.3)
Chloride: 104 mmol/L (ref 98–111)
Creatinine, Ser: 2.67 mg/dL — ABNORMAL HIGH (ref 0.44–1.00)
GFR, Estimated: 18 mL/min — ABNORMAL LOW (ref >60)
Glucose, Bld: 91 mg/dL (ref 70–99)
Potassium: 4.1 mmol/L (ref 3.5–5.1)
Sodium: 134 mmol/L — ABNORMAL LOW (ref 135–145)

## 2022-11-13 LAB — HEPATIC FUNCTION PANEL
ALT: 23 U/L (ref 0–44)
AST: 31 U/L (ref 15–41)
Albumin: 3.4 g/dL — ABNORMAL LOW (ref 3.5–5.0)
Alkaline Phosphatase: 88 U/L (ref 38–126)
Bilirubin, Direct: 0.2 mg/dL (ref 0.0–0.2)
Indirect Bilirubin: 0.7 mg/dL (ref 0.3–0.9)
Total Bilirubin: 0.9 mg/dL (ref 0.3–1.2)
Total Protein: 7.5 g/dL (ref 6.5–8.1)

## 2022-11-13 LAB — CBC
HCT: 38 % (ref 36.0–46.0)
Hemoglobin: 12.8 g/dL (ref 12.0–15.0)
MCH: 32.9 pg (ref 26.0–34.0)
MCHC: 33.7 g/dL (ref 30.0–36.0)
MCV: 97.7 fL (ref 80.0–100.0)
Platelets: 154 10*3/uL (ref 150–400)
RBC: 3.89 MIL/uL (ref 3.87–5.11)
RDW: 12.5 % (ref 11.5–15.5)
WBC: 5.5 10*3/uL (ref 4.0–10.5)
nRBC: 0 % (ref 0.0–0.2)

## 2022-11-13 LAB — TROPONIN I (HIGH SENSITIVITY): Troponin I (High Sensitivity): 23 ng/L — ABNORMAL HIGH (ref <18)

## 2022-11-13 LAB — PHOSPHORUS: Phosphorus: 2.7 mg/dL (ref 2.5–4.6)

## 2022-11-13 LAB — LIPASE, BLOOD: Lipase: 34 U/L (ref 11–51)

## 2022-11-13 MED ORDER — SODIUM CHLORIDE 0.9 % IV SOLN
1.0000 g | Freq: Once | INTRAVENOUS | Status: AC
  Administered 2022-11-13: 1 g via INTRAVENOUS
  Filled 2022-11-13: qty 10

## 2022-11-13 MED ORDER — MORPHINE SULFATE (PF) 2 MG/ML IV SOLN
2.0000 mg | Freq: Once | INTRAVENOUS | Status: AC
  Administered 2022-11-13: 2 mg via INTRAVENOUS
  Filled 2022-11-13: qty 1

## 2022-11-13 MED ORDER — MORPHINE SULFATE (PF) 4 MG/ML IV SOLN: 4.0000 mg | Freq: Once | INTRAVENOUS | Status: DC

## 2022-11-13 MED ORDER — ONDANSETRON HCL 4 MG/2ML IJ SOLN
4.0000 mg | Freq: Once | INTRAMUSCULAR | Status: AC
  Administered 2022-11-13: 4 mg via INTRAVENOUS
  Filled 2022-11-13: qty 2

## 2022-11-13 MED ORDER — LEVETIRACETAM 500 MG PO TABS
1000.0000 mg | ORAL_TABLET | Freq: Two times a day (BID) | ORAL | Status: DC
  Administered 2022-11-13 – 2022-11-15 (×4): 1000 mg via ORAL
  Filled 2022-11-13 (×4): qty 2

## 2022-11-13 MED ORDER — ACETAMINOPHEN 325 MG PO TABS
650.0000 mg | ORAL_TABLET | ORAL | Status: AC
  Administered 2022-11-13 – 2022-11-16 (×15): 650 mg via ORAL
  Filled 2022-11-13 (×15): qty 2

## 2022-11-13 MED ORDER — SODIUM CHLORIDE 0.9 % IV SOLN
1.0000 g | INTRAVENOUS | Status: DC
  Administered 2022-11-14 – 2022-11-15 (×2): 1 g via INTRAVENOUS
  Filled 2022-11-13 (×3): qty 10

## 2022-11-13 MED ORDER — POLYETHYLENE GLYCOL 3350 17 G PO PACK
17.0000 g | PACK | Freq: Every day | ORAL | Status: DC | PRN
  Administered 2022-11-15: 17 g via ORAL
  Filled 2022-11-13: qty 1

## 2022-11-13 MED ORDER — ENOXAPARIN SODIUM 30 MG/0.3ML IJ SOSY: 30.0000 mg | PREFILLED_SYRINGE | INTRAMUSCULAR | Status: DC

## 2022-11-13 MED ORDER — SODIUM CHLORIDE 0.9% FLUSH
3.0000 mL | Freq: Two times a day (BID) | INTRAVENOUS | Status: DC
  Administered 2022-11-13 – 2022-11-17 (×6): 3 mL via INTRAVENOUS

## 2022-11-13 NOTE — ED Provider Notes (Signed)
[facility] Provider Note    Event Date/Time   First MD Initiated Contact with Patient 11/13/22 1538     (approximate)   History   Chief Complaint Weakness   HPI  Leslie Houston is a 72 y.o. female with past medical history of hypertension, diabetes, CAD, CKD, CHF, urinary retention status post suprapubic catheter, and seizures who presents to the ED complaining of weakness.  Patient reports that she developed bilateral frontal headache yesterday evening that has been gradually worsening since then.  This been associated with nausea, vomiting, diarrhea, and abdominal pain.  Pain in her abdomen is described as sharp and particularly in her suprapubic area.  Daughter is concerned that Leslie Houston has been increased sediment in her catheter collecting bag, suprapubic catheter was exchanged yesterday at her urologist office.  Patient additionally reports some generalized weakness, states that she has had difficulty moving her right leg because it feels stiff and heavy.  She denies any weakness in her left leg or her arms.  She has not had any vision or speech changes.     Physical Exam   Triage Vital Signs: ED Triage Vitals  Encounter Vitals Group     BP 11/13/22 1436 (!) 150/70     Systolic BP Percentile --      Diastolic BP Percentile --      Pulse Rate 11/13/22 1436 65     Resp 11/13/22 1436 18     Temp 11/13/22 1436 98.7 F (37.1 C)     Temp Source 11/13/22 1436 Oral     SpO2 11/13/22 1436 (!) 65 %     Weight 11/13/22 1437 188 lb (85.3 kg)     Height 11/13/22 1437 5\' 1"  (1.549 m)     Head Circumference --      Peak Flow --      Pain Score 11/13/22 1437 8     Pain Loc --      Pain Education --      Exclude from Growth Chart --     Most recent vital signs: Vitals:   11/13/22 1845 11/13/22 2026  BP:    Pulse: 63   Resp: 15   Temp:  98.7 F (37.1 C)  SpO2: 94%     Constitutional: Alert and oriented. Eyes: Conjunctivae are normal.  Pupils  equal, round, and reactive to light bilaterally. Head: Atraumatic. Nose: No congestion/rhinnorhea. Mouth/Throat: Mucous membranes are moist.  Cardiovascular: Normal rate, regular rhythm. Grossly normal heart sounds.  2+ radial pulses bilaterally. Respiratory: Normal respiratory effort.  No retractions. Lungs CTAB. Gastrointestinal: Soft and diffusely tender to palpation with no rebound or guarding. No distention. Genitourinary: Suprapubic catheter collecting cloudy yellow urine. Musculoskeletal: No lower extremity tenderness nor edema.  Neurologic:  Normal speech and language.  5 out of 5 strength in bilateral upper extremities and left lower extremity.  Patient unable to lift right lower extremity off of bed but actively resists movement of the leg when I attempt passive range of motion.    ED Results / Procedures / Treatments   Labs (all labs ordered are listed, but only abnormal results are displayed) Labs Reviewed  BASIC METABOLIC PANEL - Abnormal; Notable for the following components:      Result Value   Sodium 134 (*)    CO2 21 (*)    BUN 38 (*)    Creatinine, Ser 2.67 (*)    GFR, Estimated 18 (*)    All other components within normal limits  URINALYSIS, ROUTINE W REFLEX MICROSCOPIC - Abnormal; Notable for the following components:   Color, Urine YELLOW (*)    APPearance CLOUDY (*)    Hgb urine dipstick MODERATE (*)    Protein, ur 100 (*)    Nitrite POSITIVE (*)    Leukocytes,Ua LARGE (*)    Bacteria, UA MANY (*)    All other components within normal limits  HEPATIC FUNCTION PANEL - Abnormal; Notable for the following components:   Albumin 3.4 (*)    All other components within normal limits  URINE CULTURE  CBC  LIPASE, BLOOD  TROPONIN I (HIGH SENSITIVITY)     EKG  ED ECG REPORT I, Chesley Noon, the attending physician, personally viewed and interpreted this ECG.   Date: 11/13/2022  EKG Time: 14:41  Rate: 66  Rhythm: normal sinus rhythm  Axis: Normal   Intervals:none  ST&T Change: None  ED ECG REPORT I, Chesley Noon, the attending physician, personally viewed and interpreted this ECG.   Date: 11/13/2022  EKG Time: 19:13  Rate: 63  Rhythm: normal sinus rhythm  Axis: Normal  Intervals:none  ST&T Change: None   RADIOLOGY CT head reviewed and interpreted by me with no hemorrhage or midline shift.  PROCEDURES:  Critical Care performed: No  Procedures   MEDICATIONS ORDERED IN ED: Medications  cefTRIAXone (ROCEPHIN) 1 g in sodium chloride 0.9 % 100 mL IVPB (1 g Intravenous New Bag/Given 11/13/22 2023)  ondansetron (ZOFRAN) injection 4 mg (4 mg Intravenous Given 11/13/22 1819)  morphine (PF) 2 MG/ML injection 2 mg (2 mg Intravenous Given 11/13/22 1819)     IMPRESSION / MDM / ASSESSMENT AND PLAN / ED COURSE  I reviewed the triage vital signs and the nursing notes.                              72 y.o. female with past medical history of hypertension, diabetes, CAD, CKD, CHF, urinary retention status post suprapubic catheter, and seizures who presents to the ED with increasing headache, nausea, vomiting, abdominal pain, diarrhea, and right leg discomfort since yesterday.  Patient's presentation is most consistent with acute presentation with potential threat to life or bodily function.  Differential diagnosis includes, but is not limited to, SAH, meningitis, migraine headache, tension headache, viral syndrome, dehydration, electrode abnormality, AKI, anemia, UTI, kidney stone, diverticulitis, appendicitis, stroke.  Patient chronically ill-appearing but nontoxic and in no acute distress, vital signs are unremarkable.  She complains of significant headache and also reports weakness to her right leg, however she actively resists movement of the leg on my assessment with good strength.  Difficulty with movement of the leg seems more likely to be related to pain.  She is neurovascular intact distally with strong DP pulses, no evidence of  infectious process to the leg or swelling to suggest DVT.  We will check CT head, especially given her headache, but no findings to suggest meningitis and low suspicion for [facility].  She also has diffuse abdominal tenderness and will check CT of her abdomen/pelvis.  Urine appears infected but this is difficult to interpret given her chronic catheter, if CT does not show alternative source of pain then we will treat with antibiotics.  Labs with stable CKD, no significant electrolyte abnormality noted and no significant LFT abnormality.  No anemia or leukocytosis noted.  CT head is negative for acute process, CT of abdomen/pelvis is also unremarkable.  Patient's pain improved on reassessment, but she continues  to have significant weakness in the right leg.  This could be related to UTI but if she does not improve with antibiotics, would warrant further stroke rule out.  Case discussed with hospitalist for admission.      FINAL CLINICAL IMPRESSION(S) / ED DIAGNOSES   Final diagnoses:  Lower urinary tract infectious disease  Right leg weakness  Acute nonintractable headache, unspecified headache type     Rx / DC Orders   ED Discharge Orders     None        Note:  This document was prepared using Dragon voice recognition software and may include unintentional dictation errors.   Chesley Noon, MD 11/13/22 2051

## 2022-11-13 NOTE — ED Notes (Addendum)
This tech and [place], EDT attempted to draw labs on pt. Unsuccessful attempts. A lab tech was in triage area, this tech asked lab tech if she could draw blood on this patient, lab tech stated "I have stats to do on the floor" lab tech then left triage area.  Beecher Mcardle, RN called lab to inform there was blood needed to be drawn on patient.

## 2022-11-13 NOTE — ED Triage Notes (Signed)
Patient to ED via [facility] for generalized weakness. Started yesterday after getting her suprapubic catheter exchanged. C/o headache, N/V, and abd pain.

## 2022-11-13 NOTE — H&P (Addendum)
History and Physical    Patient: Leslie Houston:[id number] DOB: [birth year] DOA: 11/13/2022 DOS: the patient was seen and examined on 11/13/2022 PCP: [name], DO  Patient coming from: Home  Chief Complaint:  Chief Complaint  Patient presents with   Weakness   HPI: Leslie Houston is a 72 y.o. female with medical history significant of remote CVA, complicated by right-sided weakness.  Patient also has CKD.  Suprapubic catheter.  Patient was in her usual state of health till yesterday when she reports her suprapubic catheter was changed by urology.  Subsequent to change patient sometimes does have suprapubic pain, which patient did have last evening.  However unusually, when patient went to bed last evening at approximately 7 PM she reports having a new right thigh pain in the lateral thigh in the middle between the knee and the hip.  Patient did not make much of it however the pain was worse this morning and when tried to ambulate, patient had severe pain that prevented her from ambulating.  Patient denies any trauma, fall denies any back pain.  Denies any vision changes headache vertigo loss of consciousness or tremor.  Patient finally came to the ER this morning because of inability to ambulate.  Patient was seen in the ER, found to have UTI based on her suprapubic pain as well as leukocytes in the urine, has been started on ceftriaxone.  However patient has been unable to demonstrate an of ability to ambulate, medical evaluation is sought.  At this time patient is reporting some suprapubic pain that patient reports often occurs after change of suprapubic catheter, is also reporting right thigh pain.  Medical evaluation is sought Review of Systems: As mentioned in the history of present illness. All other systems reviewed and are negative. Past Medical History:  Diagnosis Date   (HFpEF) heart failure with preserved ejection fraction (HCC)    a. Pt reports in 2000 she had CHF,  details unclear, outside hospital; b. 06/2019 Echo: EF 50-55%, no rwma, Gr2 DD,  mildly dil LA, mild MR.   Acute left PCA stroke (HCC) 06/22/2014   Anxiety    a.) on BZO (alprazolam) PRN   CAD (coronary artery disease)    a. Pt reports in 2000 she had a stent to a coronary artery, details unclear, outside hospital.   Carotid stenosis    a. CT angio head 07/2014 - 50-60% stenoses of prox ICA bilaterally.   CKD (chronic kidney disease), stage IV (HCC)    Depression    Diabetes mellitus (HCC)    Denies.   Edentulous    No upper teeth   Family hx of colon cancer 06/13/2018   Father diagnosed in his 13's   Gout    History of cataract extraction, left 03/21/2021   HOH (hard of hearing)    usually needs to read lips   Hyperlipidemia    Hypertension    Intellectual disability    a.) born premature, limited formal education, has never driven   Myalgia due to statin    Orthostatic hypotension    OSA (obstructive sleep apnea)    a.) not on nocturnal PAP therapy; old/nonfuntioning DME - referred for repeat PSG 01/2022 by neurology   Osteopenia 01/24/2018   DEXA Sept 2019   PONV (postoperative nausea and vomiting)    PTSD (post-traumatic stress disorder)        PUD (peptic ulcer disease)    PVC's (premature ventricular contractions)    Right sided weakness 05/2014  a.) s/p CVA   Seizures (HCC)    Sinus bradycardia    Syncope    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)    Past Surgical History:  Procedure Laterality Date   BLADDER SURGERY     CATARACT EXTRACTION W/PHACO Left 03/21/2021   Procedure: CATARACT EXTRACTION PHACO AND INTRAOCULAR LENS PLACEMENT (IOC) left 5.86 00:38.0;  Surgeon: Galen Manila, MD;  Location: [facility];  Service: Ophthalmology;  Laterality: Left;  Latex   CESAREAN SECTION     CORONARY ANGIOPLASTY WITH STENT PLACEMENT     CYSTOSCOPY N/A 03/12/2022   Procedure: CYSTOSCOPY;  Surgeon: Vanna Scotland, MD;  Location: [facility];   Service: Urology;  Laterality: N/A;   DENTAL SURGERY     INSERTION OF SUPRAPUBIC CATHETER N/A 03/12/2022   Procedure: INSERTION OF SUPRAPUBIC CATHETER;  Surgeon: Vanna Scotland, MD;  Location: [facility];  Service: Urology;  Laterality: N/A;   LAPAROTOMY N/A 03/22/2022   Procedure: EXPLORATORY LAPAROTOMY; LYSIS OF ADHESIONS;  Surgeon: Sung Amabile, DO;  Location: [facility];  Service: General;  Laterality: N/A;   RECTAL EXAM UNDER ANESTHESIA N/A 03/12/2022   Procedure: PELVIC EXAM UNDER ANESTHESIA;  Surgeon: Vanna Scotland, MD;  Location: [facility];  Service: Urology;  Laterality: N/A;   Social History:  reports that she quit smoking about 8 years ago. Her smoking use included cigarettes. She has been exposed to tobacco smoke. She has never used smokeless tobacco. She reports that she does not drink alcohol and does not use drugs.  Allergies  Allergen Reactions   Bactrim [Sulfamethoxazole-Trimethoprim] Other (See Comments)    Hematuria, dysuria   Latex Rash   Penicillin G Itching   Atorvastatin Other (See Comments)    Myalgias    Other Hives    Adhesive tape   Amlodipine Swelling    Leg swelling   Doxycycline     Blood in urine    Family History  Problem Relation Age of Onset   Stroke Mother    Heart attack Mother    Lung cancer Mother    Stroke Father    Prostate cancer Father    Colon cancer Father    Diabetes Mellitus II Daughter    Multiple sclerosis Daughter    Kidney disease Son    Heart failure Son    Cancer Paternal Grandfather     Prior to Admission medications   Medication Sig Start Date End Date Taking? Authorizing Provider  Alirocumab (PRALUENT) 150 MG/ML SOAJ Inject 1 mL (150 mg total) into the skin every 14 (fourteen) days. 10/15/22  Yes Marykay Lex, MD  aluminum-magnesium hydroxide-simethicone (MAALOX) 200-200-20 MG/5ML SUSP Take 30 mLs by mouth 4 (four) times daily -  before meals and at bedtime. 09/02/22  Yes Sharman Cheek, MD  benzonatate (TESSALON)  100 MG capsule Take 1 capsule (100 mg total) by mouth 2 (two) times daily as needed for cough. 07/02/22  Yes [name], DO  Cholecalciferol (VITAMIN D3) 25 MCG (1000 UT) CAPS Take 1 capsule by mouth daily.   Yes [provider]  citalopram (CELEXA) 20 MG tablet TAKE 1 TABLET BY MOUTH ONCE A DAY 04/26/22  Yes [name], DO  feeding supplement (ENSURE ENLIVE / ENSURE PLUS) LIQD Take 237 mLs by mouth 2 (two) times daily between meals. 04/06/22  Yes [name], MD  ferrous sulfate 325 (65 FE) MG tablet Take 325 mg by mouth daily with breakfast.   Yes [provider]  fluticasone (FLONASE) 50 MCG/ACT nasal  spray Place 2 sprays into both nostrils daily as needed for allergies (allergies).    Yes [provider]  guaiFENesin (MUCINEX) 600 MG 12 hr tablet Take 1 tablet (600 mg total) by mouth daily. 07/02/22  Yes [name], DO  hydrALAZINE (APRESOLINE) 10 MG tablet Take 10 mg by mouth daily. 04/26/22  Yes [provider]  levETIRAcetam (KEPPRA) 1000 MG tablet Take 1 tablet (1,000 mg total) by mouth 2 (two) times daily. 04/05/22  Yes [name], MD  loratadine (CLARITIN) 10 MG tablet TAKE 1 TABLET BY MOUTH ONCE A DAY AS NEEDED FOR ALLERGIES 04/26/21  Yes Mecum, Erin E, PA-C  metoCLOPramide (REGLAN) 10 MG tablet Take 1 tablet (10 mg total) by mouth every 6 (six) hours as needed. 09/02/22  Yes Sharman Cheek, MD  Multiple Vitamin (MULTIVITAMIN WITH MINERALS) TABS tablet Take 1 tablet by mouth daily. 04/06/22  Yes [name], MD  nystatin (MYCOSTATIN/NYSTOP) powder Apply 1 Application topically 3 (three) times daily. 06/05/22  Yes Vanna Scotland, MD  ondansetron (ZOFRAN-ODT) 4 MG disintegrating tablet Take 1 tablet (4 mg total) by mouth every 8 (eight) hours as needed. 04/24/22  Yes Georga Hacking, MD  pantoprazole (PROTONIX) 20 MG tablet TAKE 1 TABLET BY MOUTH ONCE A DAY 04/26/22  Yes [name], DO  polyethylene glycol  (MIRALAX / GLYCOLAX) 17 g packet Take 17 g by mouth daily. 04/06/22  Yes [name], MD  torsemide (DEMADEX) 20 MG tablet Take 1 tablet (20 mg total) by mouth daily. 08/27/22  Yes Gollan, Tollie Pizza, MD  traMADol (ULTRAM) 50 MG tablet Take 1 tablet (50 mg total) by mouth every 12 (twelve) hours as needed. 09/11/22  Yes Fisher, Roselyn Bering, PA-C  tamsulosin (FLOMAX) 0.4 MG CAPS capsule Take 0.4 mg by mouth daily. Patient not taking: Reported on 11/13/2022 11/05/22   [provider]    Physical Exam: Vitals:   11/13/22 1600 11/13/22 1830 11/13/22 1845 11/13/22 2026  BP: (!) 147/59 128/66    Pulse: 63 63 63   Resp: 16 15 15    Temp:    98.7 F (37.1 C)  TempSrc:    Oral  SpO2: 98% 92% 94%   Weight:      Height:       General, obese lady, does not appear to be in any distress per se. Respiratory exam: Bilateral intravesicular Cardiovascular exam S1-S2 normal Abdomen all quadrants are soft, complaint of suprapubic bilateral lower abdominal tenderness, no rebound or guarding Extremities are without edema and warm Detailed strength exam was performed.  Although patient does not demonstrate antigravity strength in right lower extremity, this seems to be due to pain.  Because I partially flexed the patient's right hip passively and then asked the patient to maintain that position, I could not flex or extend the patient's hip or knee further against resistance.  Distal function is intact, ankle is showing 5/5 strength.  There is focal tenderness over the right lateral thigh in an area approximately 5 cm x 5 cm that I have marked with a pen.  There is no swelling abscess like changes or skin changes or masses I could appreciate. Data Reviewed:  Labs on Admission:  Results for orders placed or performed during the hospital encounter of 11/13/22 (from the past 24 hour(s))  Urinalysis, Routine w reflex microscopic -Urine, Clean Catch     Status: Abnormal   Collection Time: 11/13/22  2:39 PM   Result Value Ref Range   Color, Urine  YELLOW (A) YELLOW   APPearance CLOUDY (A) CLEAR   Specific Gravity, Urine 1.011 1.005 - 1.030   pH 6.0 5.0 - 8.0   Glucose, UA NEGATIVE NEGATIVE mg/dL   Hgb urine dipstick MODERATE (A) NEGATIVE   Bilirubin Urine NEGATIVE NEGATIVE   Ketones, ur NEGATIVE NEGATIVE mg/dL   Protein, ur 161 (A) NEGATIVE mg/dL   Nitrite POSITIVE (A) NEGATIVE   Leukocytes,Ua LARGE (A) NEGATIVE   RBC / HPF 6-10 0 - 5 RBC/hpf   WBC, UA >50 0 - 5 WBC/hpf   Bacteria, UA MANY (A) NONE SEEN   Squamous Epithelial / HPF 6-10 0 - 5 /HPF   WBC Clumps PRESENT   Hepatic function panel     Status: Abnormal   Collection Time: 11/13/22  3:00 PM  Result Value Ref Range   Total Protein 7.5 6.5 - 8.1 g/dL   Albumin 3.4 (L) 3.5 - 5.0 g/dL   AST 31 15 - 41 U/L   ALT 23 0 - 44 U/L   Alkaline Phosphatase 88 38 - 126 U/L   Total Bilirubin 0.9 0.3 - 1.2 mg/dL   Bilirubin, Direct 0.2 0.0 - 0.2 mg/dL   Indirect Bilirubin 0.7 0.3 - 0.9 mg/dL  Lipase, blood     Status: None   Collection Time: 11/13/22  3:00 PM  Result Value Ref Range   Lipase 34 11 - 51 U/L  Basic metabolic panel     Status: Abnormal   Collection Time: 11/13/22  3:07 PM  Result Value Ref Range   Sodium 134 (L) 135 - 145 mmol/L   Potassium 4.1 3.5 - 5.1 mmol/L   Chloride 104 98 - 111 mmol/L   CO2 21 (L) 22 - 32 mmol/L   Glucose, Bld 91 70 - 99 mg/dL   BUN 38 (H) 8 - 23 mg/dL   Creatinine, Ser 0.96 (H) 0.44 - 1.00 mg/dL   Calcium 9.1 8.9 - 04.5 mg/dL   GFR, Estimated 18 (L) >60 mL/min   Anion gap 9 5 - 15  CBC     Status: None   Collection Time: 11/13/22  3:07 PM  Result Value Ref Range   WBC 5.5 4.0 - 10.5 K/uL   RBC 3.89 3.87 - 5.11 MIL/uL   Hemoglobin 12.8 12.0 - 15.0 g/dL   HCT 40.9 81.1 - 91.4 %   MCV 97.7 80.0 - 100.0 fL   MCH 32.9 26.0 - 34.0 pg   MCHC 33.7 30.0 - 36.0 g/dL   RDW 78.2 95.6 - 21.3 %   Platelets 154 150 - 400 K/uL   nRBC 0.0 0.0 - 0.2 %  Troponin I (High Sensitivity)     Status:  Abnormal   Collection Time: 11/13/22  7:50 PM  Result Value Ref Range   Troponin I (High Sensitivity) 23 (H) <18 ng/L   Basic Metabolic Panel: Recent Labs  Lab 11/13/22 1507  NA 134*  K 4.1  CL 104  CO2 21*  GLUCOSE 91  BUN 38*  CREATININE 2.67*  CALCIUM 9.1   Liver Function Tests: Recent Labs  Lab 11/13/22 1500  AST 31  ALT 23  ALKPHOS 88  BILITOT 0.9  PROT 7.5  ALBUMIN 3.4*   Recent Labs  Lab 11/13/22 1500  LIPASE 34   No results for input(s): "AMMONIA" in the last 168 hours. CBC: Recent Labs  Lab 11/13/22 1507  WBC 5.5  HGB 12.8  HCT 38.0  MCV 97.7  PLT 154   Cardiac Enzymes: Recent Labs  Lab 11/13/22 1950  TROPONINIHS 23*    BNP (last 3 results) No results for input(s): "PROBNP" in the last 8760 hours. CBG: No results for input(s): "GLUCAP" in the last 168 hours.  Radiological Exams on Admission:  CT ABDOMEN PELVIS WO CONTRAST  Result Date: 11/13/2022 CLINICAL DATA:  Abdominal pain, acute, nonlocalized EXAM: CT ABDOMEN AND PELVIS WITHOUT CONTRAST TECHNIQUE: Multidetector CT imaging of the abdomen and pelvis was performed following the standard protocol without IV contrast. RADIATION DOSE REDUCTION: This exam was performed according to the departmental dose-optimization program which includes automated exposure control, adjustment of the mA and/or kV according to patient size and/or use of iterative reconstruction technique. COMPARISON:  09/02/2022 FINDINGS: Lower chest: No pleural or pericardial effusion. Stable linear scarring in the right lung base. Hepatobiliary: No focal liver abnormality is seen. No gallstones, gallbladder wall thickening, or biliary dilatation. Pancreas: Unremarkable. No pancreatic ductal dilatation or surrounding inflammatory changes. Spleen: Normal in size without focal abnormality. Adrenals/Urinary Tract: No adrenal mass. Chronic bilateral renal parenchymal atrophy with mild distention of the renal collecting systems, no  urolithiasis. Urinary bladder decompressed by suprapubic catheter which appears well positioned. Stomach/Bowel: Stomach incompletely distended, unremarkable. Small bowel decompressed. Normal appendix. Colon is incompletely distended, without acute finding. Vascular/Lymphatic: Scattered aortoiliac calcified plaque without aneurysm. Stable subcentimeter left para-aortic and aortocaval lymph nodes. No abdominal or pelvic adenopathy. Reproductive: Uterus and bilateral adnexa are unremarkable. Other: No ascites.  No free air. Musculoskeletal: Mild spondylitic changes throughout the visualized lower thoracic and lumbar spine. No fracture or worrisome bone lesion. IMPRESSION: 1. No acute findings. Suprapubic catheter decompresses urinary bladder. 2. Chronic bilateral renal parenchymal atrophy with mild distention of the renal collecting systems, no urolithiasis. 3.  Aortic Atherosclerosis (ICD10-I70.0). Electronically Signed   By: Corlis Leak M.D.   On: 11/13/2022 17:07   CT Head Wo Contrast  Result Date: 11/13/2022 CLINICAL DATA:  Headache, new onset (Age >= 51y) EXAM: CT HEAD WITHOUT CONTRAST TECHNIQUE: Contiguous axial images were obtained from the base of the skull through the vertex without intravenous contrast. RADIATION DOSE REDUCTION: This exam was performed according to the departmental dose-optimization program which includes automated exposure control, adjustment of the mA and/or kV according to patient size and/or use of iterative reconstruction technique. COMPARISON:  CT Head 09/11/22 FINDINGS: Brain: No hemorrhage. No hydrocephalus. No extra-axial fluid collection. Chronic left occipital lobe infarct. Ex vacuo dilatation of the left occipital horn. No CT evidence of an acute cortical infarct. Vascular: No hyperdense vessel or unexpected calcification. Skull: Normal. Negative for fracture or focal lesion. Sinuses/Orbits: No middle ear or mastoid effusion. Paranasal sinuses are notable frothy secretions in  the right sphenoid sinus. Left lens replacement. Orbits are otherwise unremarkable. Other: None. IMPRESSION: 1. No acute intracranial abnormality. 2. Frothy secretions in the right sphenoid sinus. Correlate for symptoms of acute sinusitis. Electronically Signed   By: Lorenza Cambridge M.D.   On: 11/13/2022 16:58    EKG: Independently reviewed.  No ST depression,  some T wave inversions in lead II III aVF and V5.  Given the context of patient presentation I do not think this is suggestive of acute coronary syndrome   Assessment and Plan: * Thigh pain Concern was raised if patient is having focal weakness of the right lower extremity.  However in my exam I do not find any focal weakness of right lower extremity.  Patient is having pain in the right thigh.  This presentation would be very atypical for CVA.  CT head is  negative, I do not think patient would benefit from an MRI brain.  However we do need to perform further workup to assess why patient is having thigh pain.  I will check phos CK lower extremity Dopplers and x-ray.  We will start the patient on standing Tylenol, physical therapy evaluation in the morning to see how this progresses.  It is noted patient is on statin however patient should not have such focal discomfort from side effect from statin.  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  Patient had catheter changed yesterday.  Cultures have been sent.  S/p ceftriaxone, finished course.  Follow-up cultures.  Medication reconciliation is pending home med collection by pharmacy.    Advance Care Planning:   Code Status: Full Code   Consults: None at this time  Family Communication: As per patient  Severity of Illness: The appropriate patient status for this patient is OBSERVATION. Observation status is judged to be reasonable and necessary in order to provide the required intensity of service to ensure the patient's safety. The patient's presenting symptoms,  physical exam findings, and initial radiographic and laboratory data in the context of their medical condition is felt to place them at decreased risk for further clinical deterioration. Furthermore, it is anticipated that the patient will be medically stable for discharge from the hospital within 2 midnights of admission.   Author: Nolberto Hanlon, MD 11/13/2022 9:24 PM  For on call review [email or web address].

## 2022-11-13 NOTE — Assessment & Plan Note (Signed)
Concern was raised if patient is having focal weakness of the right lower extremity.  However in my exam I do not find any focal weakness of right lower extremity.  Patient is having pain in the right thigh.  This presentation would be very atypical for CVA.  CT head is negative, I do not think patient would benefit from an MRI brain.  However we do need to perform further workup to assess why patient is having thigh pain.  I will check phos CK lower extremity Dopplers and x-ray.  We will start the patient on standing Tylenol, physical therapy evaluation in the morning to see how this progresses.  It is noted patient is on statin however patient should not have such focal discomfort from side effect from statin.

## 2022-11-13 NOTE — Assessment & Plan Note (Signed)
Has been diagnosed with catheter associated UTI in the ER.  Patient had catheter changed yesterday.  Cultures have been sent.  S/p ceftriaxone, finished course.  Follow-up cultures.

## 2022-11-14 ENCOUNTER — Observation Stay: Payer: Medicare Other

## 2022-11-14 DIAGNOSIS — I82442 Acute embolism and thrombosis of left tibial vein: Secondary | ICD-10-CM | POA: Diagnosis not present

## 2022-11-14 DIAGNOSIS — Z87891 Personal history of nicotine dependence: Secondary | ICD-10-CM | POA: Diagnosis not present

## 2022-11-14 DIAGNOSIS — F32A Depression, unspecified: Secondary | ICD-10-CM | POA: Diagnosis not present

## 2022-11-14 DIAGNOSIS — K219 Gastro-esophageal reflux disease without esophagitis: Secondary | ICD-10-CM | POA: Diagnosis not present

## 2022-11-14 DIAGNOSIS — Y846 Urinary catheterization as the cause of abnormal reaction of the patient, or of later complication, without mention of misadventure at the time of the procedure: Secondary | ICD-10-CM | POA: Diagnosis present

## 2022-11-14 DIAGNOSIS — Z823 Family history of stroke: Secondary | ICD-10-CM | POA: Diagnosis not present

## 2022-11-14 DIAGNOSIS — Z8249 Family history of ischemic heart disease and other diseases of the circulatory system: Secondary | ICD-10-CM | POA: Diagnosis not present

## 2022-11-14 DIAGNOSIS — M79605 Pain in left leg: Secondary | ICD-10-CM | POA: Diagnosis not present

## 2022-11-14 DIAGNOSIS — I2510 Atherosclerotic heart disease of native coronary artery without angina pectoris: Secondary | ICD-10-CM | POA: Diagnosis not present

## 2022-11-14 DIAGNOSIS — R29898 Other symptoms and signs involving the musculoskeletal system: Secondary | ICD-10-CM

## 2022-11-14 DIAGNOSIS — K5900 Constipation, unspecified: Secondary | ICD-10-CM | POA: Diagnosis not present

## 2022-11-14 DIAGNOSIS — T83518A Infection and inflammatory reaction due to other urinary catheter, initial encounter: Secondary | ICD-10-CM | POA: Diagnosis not present

## 2022-11-14 DIAGNOSIS — F4310 Post-traumatic stress disorder, unspecified: Secondary | ICD-10-CM | POA: Diagnosis not present

## 2022-11-14 DIAGNOSIS — Z955 Presence of coronary angioplasty implant and graft: Secondary | ICD-10-CM | POA: Diagnosis not present

## 2022-11-14 DIAGNOSIS — R519 Headache, unspecified: Secondary | ICD-10-CM | POA: Diagnosis not present

## 2022-11-14 DIAGNOSIS — Z8673 Personal history of transient ischemic attack (TIA), and cerebral infarction without residual deficits: Secondary | ICD-10-CM | POA: Diagnosis not present

## 2022-11-14 DIAGNOSIS — Z87440 Personal history of urinary (tract) infections: Secondary | ICD-10-CM | POA: Diagnosis not present

## 2022-11-14 DIAGNOSIS — M79651 Pain in right thigh: Secondary | ICD-10-CM | POA: Diagnosis not present

## 2022-11-14 DIAGNOSIS — I130 Hypertensive heart and chronic kidney disease with heart failure and stage 1 through stage 4 chronic kidney disease, or unspecified chronic kidney disease: Secondary | ICD-10-CM | POA: Diagnosis not present

## 2022-11-14 DIAGNOSIS — M25461 Effusion, right knee: Secondary | ICD-10-CM | POA: Diagnosis not present

## 2022-11-14 DIAGNOSIS — G40909 Epilepsy, unspecified, not intractable, without status epilepticus: Secondary | ICD-10-CM | POA: Diagnosis not present

## 2022-11-14 DIAGNOSIS — Z6835 Body mass index (BMI) 35.0-35.9, adult: Secondary | ICD-10-CM | POA: Diagnosis not present

## 2022-11-14 DIAGNOSIS — R531 Weakness: Secondary | ICD-10-CM | POA: Diagnosis present

## 2022-11-14 DIAGNOSIS — N390 Urinary tract infection, site not specified: Secondary | ICD-10-CM | POA: Diagnosis not present

## 2022-11-14 DIAGNOSIS — I82409 Acute embolism and thrombosis of unspecified deep veins of unspecified lower extremity: Secondary | ICD-10-CM | POA: Diagnosis present

## 2022-11-14 DIAGNOSIS — N184 Chronic kidney disease, stage 4 (severe): Secondary | ICD-10-CM | POA: Diagnosis not present

## 2022-11-14 DIAGNOSIS — E785 Hyperlipidemia, unspecified: Secondary | ICD-10-CM | POA: Diagnosis not present

## 2022-11-14 DIAGNOSIS — E1122 Type 2 diabetes mellitus with diabetic chronic kidney disease: Secondary | ICD-10-CM | POA: Diagnosis not present

## 2022-11-14 DIAGNOSIS — I5032 Chronic diastolic (congestive) heart failure: Secondary | ICD-10-CM | POA: Diagnosis not present

## 2022-11-14 DIAGNOSIS — G4733 Obstructive sleep apnea (adult) (pediatric): Secondary | ICD-10-CM | POA: Diagnosis not present

## 2022-11-14 DIAGNOSIS — M1611 Unilateral primary osteoarthritis, right hip: Secondary | ICD-10-CM | POA: Diagnosis not present

## 2022-11-14 DIAGNOSIS — M79604 Pain in right leg: Secondary | ICD-10-CM | POA: Diagnosis not present

## 2022-11-14 DIAGNOSIS — M1711 Unilateral primary osteoarthritis, right knee: Secondary | ICD-10-CM | POA: Diagnosis not present

## 2022-11-14 LAB — CBC
HCT: 36 % (ref 36.0–46.0)
Hemoglobin: 11.7 g/dL — ABNORMAL LOW (ref 12.0–15.0)
MCH: 32.7 pg (ref 26.0–34.0)
MCHC: 32.5 g/dL (ref 30.0–36.0)
MCV: 100.6 fL — ABNORMAL HIGH (ref 80.0–100.0)
Platelets: 157 10*3/uL (ref 150–400)
RBC: 3.58 MIL/uL — ABNORMAL LOW (ref 3.87–5.11)
RDW: 12.1 % (ref 11.5–15.5)
WBC: 4.4 10*3/uL (ref 4.0–10.5)
nRBC: 0 % (ref 0.0–0.2)

## 2022-11-14 LAB — BASIC METABOLIC PANEL
Anion gap: 8 (ref 5–15)
BUN: 38 mg/dL — ABNORMAL HIGH (ref 8–23)
CO2: 20 mmol/L — ABNORMAL LOW (ref 22–32)
Calcium: 8.7 mg/dL — ABNORMAL LOW (ref 8.9–10.3)
Chloride: 107 mmol/L (ref 98–111)
Creatinine, Ser: 2.43 mg/dL — ABNORMAL HIGH (ref 0.44–1.00)
GFR, Estimated: 20 mL/min — ABNORMAL LOW (ref >60)
Glucose, Bld: 78 mg/dL (ref 70–99)
Potassium: 4 mmol/L (ref 3.5–5.1)
Sodium: 135 mmol/L (ref 135–145)

## 2022-11-14 LAB — HEPARIN LEVEL (UNFRACTIONATED)
Heparin Unfractionated: 0.25 IU/mL — ABNORMAL LOW (ref 0.30–0.70)
Heparin Unfractionated: 0.47 IU/mL (ref 0.30–0.70)

## 2022-11-14 LAB — CK: Total CK: 36 U/L — ABNORMAL LOW (ref 38–234)

## 2022-11-14 LAB — PROTIME-INR
INR: 1.3 — ABNORMAL HIGH (ref 0.8–1.2)
Prothrombin Time: 16.5 seconds — ABNORMAL HIGH (ref 11.4–15.2)

## 2022-11-14 LAB — TROPONIN I (HIGH SENSITIVITY): Troponin I (High Sensitivity): 15 ng/L (ref <18)

## 2022-11-14 LAB — APTT: aPTT: 92 seconds — ABNORMAL HIGH (ref 24–36)

## 2022-11-14 MED ORDER — VITAMIN D 25 MCG (1000 UNIT) PO TABS
1000.0000 [IU] | ORAL_TABLET | Freq: Every day | ORAL | Status: DC
  Administered 2022-11-14 – 2022-11-17 (×4): 1000 [IU] via ORAL
  Filled 2022-11-14 (×4): qty 1

## 2022-11-14 MED ORDER — TORSEMIDE 20 MG PO TABS
20.0000 mg | ORAL_TABLET | Freq: Every day | ORAL | Status: DC
  Administered 2022-11-14 – 2022-11-15 (×2): 20 mg via ORAL
  Filled 2022-11-14 (×2): qty 1

## 2022-11-14 MED ORDER — HEPARIN BOLUS VIA INFUSION
4000.0000 [IU] | Freq: Once | INTRAVENOUS | Status: AC
  Administered 2022-11-14: 4000 [IU] via INTRAVENOUS
  Filled 2022-11-14: qty 4000

## 2022-11-14 MED ORDER — CITALOPRAM HYDROBROMIDE 20 MG PO TABS
20.0000 mg | ORAL_TABLET | Freq: Every day | ORAL | Status: DC
  Administered 2022-11-14 – 2022-11-17 (×4): 20 mg via ORAL
  Filled 2022-11-14 (×4): qty 1

## 2022-11-14 MED ORDER — HEPARIN BOLUS VIA INFUSION
1000.0000 [IU] | Freq: Once | INTRAVENOUS | Status: AC
  Administered 2022-11-14: 1000 [IU] via INTRAVENOUS
  Filled 2022-11-14: qty 1000

## 2022-11-14 MED ORDER — FLUTICASONE PROPIONATE 50 MCG/ACT NA SUSP: 2.0000 | Freq: Every day | NASAL | Status: DC | PRN

## 2022-11-14 MED ORDER — TRAMADOL HCL 50 MG PO TABS
50.0000 mg | ORAL_TABLET | Freq: Two times a day (BID) | ORAL | Status: DC | PRN
  Administered 2022-11-15: 50 mg via ORAL
  Filled 2022-11-14: qty 1

## 2022-11-14 MED ORDER — PANTOPRAZOLE SODIUM 20 MG PO TBEC
20.0000 mg | DELAYED_RELEASE_TABLET | Freq: Every day | ORAL | Status: DC
  Administered 2022-11-14 – 2022-11-17 (×4): 20 mg via ORAL
  Filled 2022-11-14 (×4): qty 1

## 2022-11-14 MED ORDER — ENSURE ENLIVE PO LIQD
237.0000 mL | Freq: Two times a day (BID) | ORAL | Status: DC
  Administered 2022-11-14 – 2022-11-16 (×4): 237 mL via ORAL

## 2022-11-14 MED ORDER — FERROUS SULFATE 325 (65 FE) MG PO TABS
325.0000 mg | ORAL_TABLET | Freq: Every day | ORAL | Status: DC
  Administered 2022-11-14 – 2022-11-17 (×4): 325 mg via ORAL
  Filled 2022-11-14 (×4): qty 1

## 2022-11-14 MED ORDER — POLYETHYLENE GLYCOL 3350 17 G PO PACK
17.0000 g | PACK | Freq: Every day | ORAL | Status: DC
  Administered 2022-11-14 – 2022-11-17 (×4): 17 g via ORAL
  Filled 2022-11-14 (×4): qty 1

## 2022-11-14 MED ORDER — ONDANSETRON 4 MG PO TBDP
4.0000 mg | ORAL_TABLET | Freq: Once | ORAL | Status: AC
  Administered 2022-11-14: 4 mg via ORAL
  Filled 2022-11-14: qty 1

## 2022-11-14 MED ORDER — HEPARIN (PORCINE) 25000 UT/250ML-% IV SOLN
1150.0000 [IU]/h | INTRAVENOUS | Status: AC
  Administered 2022-11-14: 1150 [IU]/h via INTRAVENOUS
  Administered 2022-11-14: 1000 [IU]/h via INTRAVENOUS
  Filled 2022-11-14 (×2): qty 250

## 2022-11-14 NOTE — Consult Note (Signed)
ANTICOAGULATION CONSULT NOTE - Initial Consult  Pharmacy Consult for Heparin  Indication: DVT  Allergies  Allergen Reactions   Bactrim [Sulfamethoxazole-Trimethoprim] Other (See Comments)    Hematuria, dysuria   Latex Rash   Penicillin G Itching   Atorvastatin Other (See Comments)    Myalgias    Other Hives    Adhesive tape   Amlodipine Swelling    Leg swelling   Doxycycline     Blood in urine    Patient Measurements: Height: 5\' 1"  (154.9 cm) Weight: 85.3 kg (188 lb) IBW/kg (Calculated) : 47.8 Heparin Dosing Weight: 67.4 kg  Vital Signs: Temp: 98 F (36.7 C) (07/17 0738) Temp Source: Oral (07/17 0738) BP: 127/46 (07/17 1100) Pulse Rate: 56 (07/17 1100)  Labs: Recent Labs    11/13/22 1507 11/13/22 1950 11/14/22 0748 11/14/22 1122  HGB 12.8  --  11.7*  --   HCT 38.0  --  36.0  --   PLT 154  --  157  --   APTT  --   --  92*  --   LABPROT  --   --  16.5*  --   INR  --   --  1.3*  --   HEPARINUNFRC  --   --   --  0.25*  CREATININE 2.67*  --  2.43*  --   CKTOTAL  --   --  36*  --   TROPONINIHS  --  23* 15  --     Estimated Creatinine Clearance: 20.4 mL/min (A) (by C-G formula based on SCr of 2.43 mg/dL (H)).   Medical History: Past Medical History:  Diagnosis Date   (HFpEF) heart failure with preserved ejection fraction (HCC)    a. Pt reports in 2000 she had CHF, details unclear, outside hospital; b. 06/2019 Echo: EF 50-55%, no rwma, Gr2 DD,  mildly dil LA, mild MR.   Acute left PCA stroke (HCC) 06/22/2014   Anxiety    a.) on BZO (alprazolam) PRN   CAD (coronary artery disease)    a. Pt reports in 2000 she had a stent to a coronary artery, details unclear, outside hospital.   Carotid stenosis    a. CT angio head 07/2014 - 50-60% stenoses of prox ICA bilaterally.   CKD (chronic kidney disease), stage IV (HCC)    Depression    Diabetes mellitus (HCC)    Denies.   Edentulous    No upper teeth   Family hx of colon cancer 06/13/2018   Father diagnosed in  his 23's   Gout    History of cataract extraction, left 03/21/2021   HOH (hard of hearing)    usually needs to read lips   Hyperlipidemia    Hypertension    Intellectual disability    a.) born premature, limited formal education, has never driven   Myalgia due to statin    Orthostatic hypotension    OSA (obstructive sleep apnea)    a.) not on nocturnal PAP therapy; old/nonfuntioning DME - referred for repeat PSG 01/2022 by neurology   Osteopenia 01/24/2018   DEXA Sept 2019   PONV (postoperative nausea and vomiting)    PTSD (post-traumatic stress disorder)        PUD (peptic ulcer disease)    PVC's (premature ventricular contractions)    Right sided weakness 05/2014   a.) s/p CVA   Seizures (HCC)    Sinus bradycardia    Syncope    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)  Medications:  (Not in a hospital admission)  Scheduled:   acetaminophen  650 mg Oral Q4H   citalopram  20 mg Oral Daily   feeding supplement  237 mL Oral BID BM   [START ON 11/15/2022] ferrous sulfate  325 mg Oral Q breakfast   heparin  1,000 Units Intravenous Once   levETIRAcetam  1,000 mg Oral BID   pantoprazole  20 mg Oral Daily   polyethylene glycol  17 g Oral Daily   sodium chloride flush  3 mL Intravenous Q12H   torsemide  20 mg Oral Daily   Vitamin D3  1 capsule Oral Daily   Infusions:   cefTRIAXone (ROCEPHIN)  IV     heparin 1,150 Units/hr (11/14/22 1156)   PRN: fluticasone, polyethylene glycol, traMADol Anti-infectives (From admission, onward)    Start     Dose/Rate Route Frequency Ordered Stop   11/14/22 2000  cefTRIAXone (ROCEPHIN) 1 g in sodium chloride 0.9 % 100 mL IVPB        1 g 200 mL/hr over 30 Minutes Intravenous Every 24 hours 11/13/22 2126 11/20/22 1959   11/13/22 2015  cefTRIAXone (ROCEPHIN) 1 g in sodium chloride 0.9 % 100 mL IVPB        1 g 200 mL/hr over 30 Minutes Intravenous  Once 11/13/22 2006 11/13/22 2146       Assessment: Pharmacy consulted  to start heparin for VTE treatment. 72 y.o. female with medical history significant of remote CVA, presented to the ED due to weakness. 7 PM she reports having a new right thigh pain in the lateral thigh in the middle between the knee and the hip. No DOAC PTA.   [place] of leg:  1) Occlusive, expansile thrombus within 1 of the 2 paired posterior tibial veins within the infrapopliteal left lower extremity. This does not extend into the left popliteal vein. 2. No evidence of deep venous thrombosis within the right lower extremity.  Goal of Therapy:  Heparin level 0.3-0.7 units/ml Monitor platelets by anticoagulation protocol: Yes   Plan:  Give 1000 units bolus x 1 Start heparin infusion at 1150 units/hr Check anti-Xa level in 8 hours and daily while on heparin Continue to monitor H&H and platelets   Buddy Duty, PharmD Student 11/14/2022,11:56 AM

## 2022-11-14 NOTE — Progress Notes (Signed)
Radiological Exams on Admission:  US Venous Img Lower Bilateral (DVT)  Result Date: 11/14/2022 CLINICAL DATA:  Bilateral leg pain EXAM: BILATERAL LOWER EXTREMITY VENOUS DOPPLER ULTRASOUND TECHNIQUE: Gray-scale sonography with compression, as well as color and duplex ultrasound, were performed to evaluate the deep venous system(s) from the level of the common femoral vein through the popliteal and proximal calf veins. COMPARISON:  None Available. FINDINGS: VENOUS Normal compressibility of the common femoral, superficial femoral, and popliteal veins, as well as the visualized right calf veins. Visualized portions of profunda femoral vein and great saphenous vein unremarkable. No filling defects to suggest DVT on grayscale or color Doppler imaging within these vessels. Doppler waveforms show normal direction of venous flow, normal respiratory plasticity and response to augmentation. Within the left lower extremity, there is, however, occlusive, expansile thrombus seen within 1 of the 2 paired posterior tibial veins within the infrapopliteal left lower extremity. This does not extend into the left popliteal vein. The paired, visualized peroneal veins are patent with appropriate antegrade flow. OTHER None. Limitations: none IMPRESSION: 1. Occlusive, expansile thrombus within 1 of the 2 paired posterior tibial veins within the infrapopliteal left lower extremity. This does not extend into the left popliteal vein. 2. No evidence of deep venous thrombosis within the right lower extremity. Electronically Signed   By: Helyn Numbers M.D.   On: 11/14/2022 00:42   DG FEMUR PORT, MIN 2 VIEWS RIGHT  Result Date: 11/13/2022 CLINICAL DATA:  Right leg pain EXAM: RIGHT FEMUR PORTABLE 2 VIEW COMPARISON:  None Available. FINDINGS: Degenerative changes are demonstrated in the right hip and right knee. No evidence of acute fracture or dislocation. No focal bone lesion or bone destruction. Vascular calcifications in the soft  tissues. IMPRESSION: Degenerative changes in the right hip and right knee. No acute bony abnormalities. Electronically Signed   By: Burman Nieves M.D.   On: 11/13/2022 22:05   CT ABDOMEN PELVIS WO CONTRAST  Result Date: 11/13/2022 CLINICAL DATA:  Abdominal pain, acute, nonlocalized EXAM: CT ABDOMEN AND PELVIS WITHOUT CONTRAST TECHNIQUE: Multidetector CT imaging of the abdomen and pelvis was performed following the standard protocol without IV contrast. RADIATION DOSE REDUCTION: This exam was performed according to the departmental dose-optimization program which includes automated exposure control, adjustment of the mA and/or kV according to patient size and/or use of iterative reconstruction technique. COMPARISON:  09/02/2022 FINDINGS: Lower chest: No pleural or pericardial effusion. Stable linear scarring in the right lung base. Hepatobiliary: No focal liver abnormality is seen. No gallstones, gallbladder wall thickening, or biliary dilatation. Pancreas: Unremarkable. No pancreatic ductal dilatation or surrounding inflammatory changes. Spleen: Normal in size without focal abnormality. Adrenals/Urinary Tract: No adrenal mass. Chronic bilateral renal parenchymal atrophy with mild distention of the renal collecting systems, no urolithiasis. Urinary bladder decompressed by suprapubic catheter which appears well positioned. Stomach/Bowel: Stomach incompletely distended, unremarkable. Small bowel decompressed. Normal appendix. Colon is incompletely distended, without acute finding. Vascular/Lymphatic: Scattered aortoiliac calcified plaque without aneurysm. Stable subcentimeter left para-aortic and aortocaval lymph nodes. No abdominal or pelvic adenopathy. Reproductive: Uterus and bilateral adnexa are unremarkable. Other: No ascites.  No free air. Musculoskeletal: Mild spondylitic changes throughout the visualized lower thoracic and lumbar spine. No fracture or worrisome bone lesion. IMPRESSION: 1. No acute  findings. Suprapubic catheter decompresses urinary bladder. 2. Chronic bilateral renal parenchymal atrophy with mild distention of the renal collecting systems, no urolithiasis. 3.  Aortic Atherosclerosis (ICD10-I70.0). Electronically Signed   By: Corlis Leak M.D.   On: 11/13/2022 17:07  CT Head Wo Contrast  Result Date: 11/13/2022 CLINICAL DATA:  Headache, new onset (Age >= 51y) EXAM: CT HEAD WITHOUT CONTRAST TECHNIQUE: Contiguous axial images were obtained from the base of the skull through the vertex without intravenous contrast. RADIATION DOSE REDUCTION: This exam was performed according to the departmental dose-optimization program which includes automated exposure control, adjustment of the mA and/or kV according to patient size and/or use of iterative reconstruction technique. COMPARISON:  CT Head 09/11/22 FINDINGS: Brain: No hemorrhage. No hydrocephalus. No extra-axial fluid collection. Chronic left occipital lobe infarct. Ex vacuo dilatation of the left occipital horn. No CT evidence of an acute cortical infarct. Vascular: No hyperdense vessel or unexpected calcification. Skull: Normal. Negative for fracture or focal lesion. Sinuses/Orbits: No middle ear or mastoid effusion. Paranasal sinuses are notable frothy secretions in the right sphenoid sinus. Left lens replacement. Orbits are otherwise unremarkable. Other: None. IMPRESSION: 1. No acute intracranial abnormality. 2. Frothy secretions in the right sphenoid sinus. Correlate for symptoms of acute sinusitis. Electronically Signed   By: Lorenza Cambridge M.D.   On: 11/13/2022 16:58    DVT start heparin infusion

## 2022-11-14 NOTE — ED Notes (Signed)
PT to bedside at this time 

## 2022-11-14 NOTE — Progress Notes (Signed)
[facility]                                                                              Leslie Houston, is a 72 y.o. female, DOB - 1950-11-13, ZOX:[id number] Admit date - 11/13/2022    Outpatient Primary MD for the patient is [name], DO  LOS - 0  days  Chief Complaint  Patient presents with   Weakness       Brief summary   Patient is a 72 year old female with remote CVA, complicated by right-sided weakness, CKD stage 4, chronic suprapubic catheter, CAD, chronic diastolic CHF, OSA presented to ED with generalized weakness.  Patient was in her usual state of health until 7/15 when she had the suprapubic cath catheter changed due to urinary retention, had tolerated the procedure well.  Patient then started having new right thigh pain in the evening.  Next morning, she noted the pain was worse when she tried to ambulate.  No fall or any trauma, no back pain.  Also reported headache, nausea, vomiting, diarrhea, abdominal pain/suprapubic.  No focal weakness, vision changes. in ED, CK 36, troponins negative  CT abdomen showed no acute findings, suprapubic catheter decompresses urinary bladder.  Chronic bilateral renal parenchymal atrophy with mild distention of the renal collecting systems, no urolithiasis. CT head showed no acute intracranial abnormality, secretions in the right sphenoid sinus, correlate with acute sinusitis.  Venous Doppler lower extremity showed occlusive, expansile thrombus in the posterior tibial vein within the infrapopliteal left lower extremity, does not extend into the left popliteal vein.  No evidence of DVT in the right lower extremity  Assessment & Plan    Principal Problem:   Right Thigh pain -Unclear etiology, CK normal.  Venous Doppler with no evidence of DVT in the right lower extremity -On exam, patient complaining of pain and not able to lift up, no fall or trauma  -will obtain CT hip/femur  -PT OT evaluation once cleared  for any fracture or dislocation   Active Problems:  Acute LLE DVT  - started on IV heparin drip    Recurrent UTI, CAUTI -Follow urine culture and sensitivities, continue IV Rocephin     Essential hypertension -BP currently stable     CKD (chronic kidney disease) stage 4, GFR 15-29 ml/min (HCC) -Baseline creatinine 2.4-3.5 -Currently at baseline -On torsemide    History of CVA (cerebrovascular accident) -CT head negative, no focal weakness    Urinary retention, history of chronic suprapubic catheter -Recently treated for UTI patient 11/12/18    OSA (obstructive sleep apnea)     Seizure disorder (HCC) -Continue Keppra    GERD without esophagitis -Continue PPI    Morbid obesity (HCC) Estimated body mass index is 35.52 kg/m as calculated from the following:   Height as of this encounter: 5\' 1"  (1.549 m).   Weight as of this encounter: 85.3 kg.  Code Status: Full code DVT Prophylaxis:  SCDs Start: 11/13/22 2138   Level of Care: Level of care: Med-Surg Family Communication: Updated patient Disposition Plan:      Remains inpatient appropriate: Workup in progress   Procedures:  Venous Doppler lower extremity  Consultants:     Antimicrobials:   Anti-infectives (From admission, onward)    Start     Dose/Rate Route Frequency Ordered Stop   11/14/22 2000  cefTRIAXone (ROCEPHIN) 1 g in sodium chloride 0.9 % 100 mL IVPB        1 g 200 mL/hr over 30 Minutes Intravenous Every 24 hours 11/13/22 2126 11/20/22 1959   11/13/22 2015  cefTRIAXone (ROCEPHIN) 1 g in sodium chloride 0.9 % 100 mL IVPB        1 g 200 mL/hr over 30 Minutes Intravenous  Once 11/13/22 2006 11/13/22 2146          Medications  acetaminophen  650 mg Oral Q4H   levETIRAcetam  1,000 mg Oral BID   sodium chloride flush  3 mL Intravenous Q12H      Subjective:   Avaya Mcjunkins was seen and examined today.  Complaining of pain in the right mid thigh, frequently picking up due to pain.   Patient denies dizziness, chest pain, shortness of breath, abdominal pain, N/V/D/C.   Objective:   Vitals:   11/14/22 0550 11/14/22 0738 11/14/22 0900 11/14/22 1100  BP: (!) 155/64 (!) 139/52 129/66 (!) 127/46  Pulse: 63 (!) 57 61 (!) 56  Resp: 18 15 (!) 21 17  Temp:  98 F (36.7 C)    TempSrc:  Oral    SpO2: 95% 94% 96% 95%  Weight:      Height:        Intake/Output Summary (Last 24 hours) at 11/14/2022 1148 Last data filed at 11/14/2022 4098 Gross per 24 hour  Intake 100 ml  Output 600 ml  Net -500 ml     Wt Readings from Last 3 Encounters:  11/13/22 85.3 kg  11/12/22 85.3 kg  10/12/22 82.6 kg     Exam General: Alert and oriented x 3, NAD Cardiovascular: S1 S2 auscultated,  RRR Respiratory: Clear to auscultation bilaterally, no wheezing, rales or rhonchi Gastrointestinal: Soft, mild suprapubic TTP, nondistended, + bowel sounds Ext: obesity, ROM decreased in right hip, focal tenderness over the right thigh area Neuro: difficult to assess right lower extremity due to pain Psych: Normal affect     Data Reviewed:  I have personally reviewed following labs    CBC Lab Results  Component Value Date   WBC 4.4 11/14/2022   RBC 3.58 (L) 11/14/2022   HGB 11.7 (L) 11/14/2022   HCT 36.0 11/14/2022   MCV 100.6 (H) 11/14/2022   MCH 32.7 11/14/2022   PLT 157 11/14/2022   MCHC 32.5 11/14/2022   RDW 12.1 11/14/2022   LYMPHSABS 1,294 05/03/2022   MONOABS 0.4 03/14/2022   EOSABS 90 05/03/2022   BASOSABS 22 05/03/2022     Last metabolic panel Lab Results  Component Value Date   NA 135 11/14/2022   K 4.0 11/14/2022   CL 107 11/14/2022   CO2 20 (L) 11/14/2022   BUN 38 (H) 11/14/2022   CREATININE 2.43 (H) 11/14/2022   GLUCOSE 78 11/14/2022   GFRNONAA 20 (L) 11/14/2022   GFRAA 23 (L) 05/12/2020   CALCIUM 8.7 (L) 11/14/2022   PHOS 2.7 11/13/2022   PROT 7.5 11/13/2022   ALBUMIN 3.4 (L) 11/13/2022   BILITOT 0.9 11/13/2022   ALKPHOS 88 11/13/2022   AST 31  11/13/2022   ALT 23 11/13/2022   ANIONGAP 8 11/14/2022    CBG (last 3)  No results for input(s): "GLUCAP" in the last 72 hours.    Coagulation Profile: Recent Labs  Lab  11/14/22 0748  INR 1.3*     Radiology Studies: I have personally reviewed the imaging studies  US Venous Img Lower Bilateral (DVT)  Result Date: 11/14/2022 CLINICAL DATA:  Bilateral leg pain EXAM: BILATERAL LOWER EXTREMITY VENOUS DOPPLER ULTRASOUND TECHNIQUE: Gray-scale sonography with compression, as well as color and duplex ultrasound, were performed to evaluate the deep venous system(s) from the level of the common femoral vein through the popliteal and proximal calf veins. COMPARISON:  None Available. FINDINGS: VENOUS Normal compressibility of the common femoral, superficial femoral, and popliteal veins, as well as the visualized right calf veins. Visualized portions of profunda femoral vein and great saphenous vein unremarkable. No filling defects to suggest DVT on grayscale or color Doppler imaging within these vessels. Doppler waveforms show normal direction of venous flow, normal respiratory plasticity and response to augmentation. Within the left lower extremity, there is, however, occlusive, expansile thrombus seen within 1 of the 2 paired posterior tibial veins within the infrapopliteal left lower extremity. This does not extend into the left popliteal vein. The paired, visualized peroneal veins are patent with appropriate antegrade flow. OTHER None. Limitations: none IMPRESSION: 1. Occlusive, expansile thrombus within 1 of the 2 paired posterior tibial veins within the infrapopliteal left lower extremity. This does not extend into the left popliteal vein. 2. No evidence of deep venous thrombosis within the right lower extremity. Electronically Signed   By: Helyn Numbers M.D.   On: 11/14/2022 00:42   DG FEMUR PORT, MIN 2 VIEWS RIGHT  Result Date: 11/13/2022 CLINICAL DATA:  Right leg pain EXAM: RIGHT FEMUR PORTABLE  2 VIEW COMPARISON:  None Available. FINDINGS: Degenerative changes are demonstrated in the right hip and right knee. No evidence of acute fracture or dislocation. No focal bone lesion or bone destruction. Vascular calcifications in the soft tissues. IMPRESSION: Degenerative changes in the right hip and right knee. No acute bony abnormalities. Electronically Signed   By: Burman Nieves M.D.   On: 11/13/2022 22:05   CT ABDOMEN PELVIS WO CONTRAST  Result Date: 11/13/2022 CLINICAL DATA:  Abdominal pain, acute, nonlocalized EXAM: CT ABDOMEN AND PELVIS WITHOUT CONTRAST TECHNIQUE: Multidetector CT imaging of the abdomen and pelvis was performed following the standard protocol without IV contrast. RADIATION DOSE REDUCTION: This exam was performed according to the departmental dose-optimization program which includes automated exposure control, adjustment of the mA and/or kV according to patient size and/or use of iterative reconstruction technique. COMPARISON:  09/02/2022 FINDINGS: Lower chest: No pleural or pericardial effusion. Stable linear scarring in the right lung base. Hepatobiliary: No focal liver abnormality is seen. No gallstones, gallbladder wall thickening, or biliary dilatation. Pancreas: Unremarkable. No pancreatic ductal dilatation or surrounding inflammatory changes. Spleen: Normal in size without focal abnormality. Adrenals/Urinary Tract: No adrenal mass. Chronic bilateral renal parenchymal atrophy with mild distention of the renal collecting systems, no urolithiasis. Urinary bladder decompressed by suprapubic catheter which appears well positioned. Stomach/Bowel: Stomach incompletely distended, unremarkable. Small bowel decompressed. Normal appendix. Colon is incompletely distended, without acute finding. Vascular/Lymphatic: Scattered aortoiliac calcified plaque without aneurysm. Stable subcentimeter left para-aortic and aortocaval lymph nodes. No abdominal or pelvic adenopathy. Reproductive: Uterus  and bilateral adnexa are unremarkable. Other: No ascites.  No free air. Musculoskeletal: Mild spondylitic changes throughout the visualized lower thoracic and lumbar spine. No fracture or worrisome bone lesion. IMPRESSION: 1. No acute findings. Suprapubic catheter decompresses urinary bladder. 2. Chronic bilateral renal parenchymal atrophy with mild distention of the renal collecting systems, no urolithiasis. 3.  Aortic Atherosclerosis (ICD10-I70.0). Electronically Signed  By: Corlis Leak M.D.   On: 11/13/2022 17:07   CT Head Wo Contrast  Result Date: 11/13/2022 CLINICAL DATA:  Headache, new onset (Age >= 51y) EXAM: CT HEAD WITHOUT CONTRAST TECHNIQUE: Contiguous axial images were obtained from the base of the skull through the vertex without intravenous contrast. RADIATION DOSE REDUCTION: This exam was performed according to the departmental dose-optimization program which includes automated exposure control, adjustment of the mA and/or kV according to patient size and/or use of iterative reconstruction technique. COMPARISON:  CT Head 09/11/22 FINDINGS: Brain: No hemorrhage. No hydrocephalus. No extra-axial fluid collection. Chronic left occipital lobe infarct. Ex vacuo dilatation of the left occipital horn. No CT evidence of an acute cortical infarct. Vascular: No hyperdense vessel or unexpected calcification. Skull: Normal. Negative for fracture or focal lesion. Sinuses/Orbits: No middle ear or mastoid effusion. Paranasal sinuses are notable frothy secretions in the right sphenoid sinus. Left lens replacement. Orbits are otherwise unremarkable. Other: None. IMPRESSION: 1. No acute intracranial abnormality. 2. Frothy secretions in the right sphenoid sinus. Correlate for symptoms of acute sinusitis. Electronically Signed   By: Lorenza Cambridge M.D.   On: 11/13/2022 16:58       Talma Aguillard M.D. [facility] 11/14/2022, 11:48 AM  Available via Epic secure chat 7am-7pm After 7 pm, please refer to night  coverage provider listed on amion.

## 2022-11-14 NOTE — Consult Note (Signed)
ANTICOAGULATION CONSULT NOTE   Pharmacy Consult for Heparin  Indication: DVT  Allergies  Allergen Reactions   Bactrim [Sulfamethoxazole-Trimethoprim] Other (See Comments)    Hematuria, dysuria   Latex Rash   Penicillin G Itching   Atorvastatin Other (See Comments)    Myalgias    Other Hives    Adhesive tape   Amlodipine Swelling    Leg swelling   Doxycycline     Blood in urine    Patient Measurements: Height: 5\' 1"  (154.9 cm) Weight: 85.3 kg (188 lb) IBW/kg (Calculated) : 47.8 Heparin Dosing Weight: 67.4 kg  Vital Signs: Temp: 97.5 F (36.4 C) (07/17 2053) Temp Source: Oral (07/17 2053) BP: 173/69 (07/17 2053) Pulse Rate: 50 (07/17 2053)  Labs: Recent Labs    11/13/22 1507 11/13/22 1950 11/14/22 0748 11/14/22 1122 11/14/22 2138  HGB 12.8  --  11.7*  --   --   HCT 38.0  --  36.0  --   --   PLT 154  --  157  --   --   APTT  --   --  92*  --   --   LABPROT  --   --  16.5*  --   --   INR  --   --  1.3*  --   --   HEPARINUNFRC  --   --   --  0.25* 0.47  CREATININE 2.67*  --  2.43*  --   --   CKTOTAL  --   --  36*  --   --   TROPONINIHS  --  23* 15  --   --     Estimated Creatinine Clearance: 20.4 mL/min (A) (by C-G formula based on SCr of 2.43 mg/dL (H)).   Medical History: Past Medical History:  Diagnosis Date   (HFpEF) heart failure with preserved ejection fraction (HCC)    a. Pt reports in 2000 she had CHF, details unclear, outside hospital; b. 06/2019 Echo: EF 50-55%, no rwma, Gr2 DD,  mildly dil LA, mild MR.   Acute left PCA stroke (HCC) 06/22/2014   Anxiety    a.) on BZO (alprazolam) PRN   CAD (coronary artery disease)    a. Pt reports in 2000 she had a stent to a coronary artery, details unclear, outside hospital.   Carotid stenosis    a. CT angio head 07/2014 - 50-60% stenoses of prox ICA bilaterally.   CKD (chronic kidney disease), stage IV (HCC)    Depression    Diabetes mellitus (HCC)    Denies.   Edentulous    No upper teeth   Family hx  of colon cancer 06/13/2018   Father diagnosed in his 45's   Gout    History of cataract extraction, left 03/21/2021   HOH (hard of hearing)    usually needs to read lips   Hyperlipidemia    Hypertension    Intellectual disability    a.) born premature, limited formal education, has never driven   Myalgia due to statin    Orthostatic hypotension    OSA (obstructive sleep apnea)    a.) not on nocturnal PAP therapy; old/nonfuntioning DME - referred for repeat PSG 01/2022 by neurology   Osteopenia 01/24/2018   DEXA Sept 2019   PONV (postoperative nausea and vomiting)    PTSD (post-traumatic stress disorder)        PUD (peptic ulcer disease)    PVC's (premature ventricular contractions)    Right sided weakness 05/2014   a.) s/p  CVA   Seizures (HCC)    Sinus bradycardia    Syncope    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)     Medications:  Medications Prior to Admission  Medication Sig Dispense Refill Last Dose   Alirocumab (PRALUENT) 150 MG/ML SOAJ Inject 1 mL (150 mg total) into the skin every 14 (fourteen) days. 2 mL 2 Past Month   aluminum-magnesium hydroxide-simethicone (MAALOX) 200-200-20 MG/5ML SUSP Take 30 mLs by mouth 4 (four) times daily -  before meals and at bedtime. 355 mL 0 unk at unk   benzonatate (TESSALON) 100 MG capsule Take 1 capsule (100 mg total) by mouth 2 (two) times daily as needed for cough. 20 capsule 0 unk at un   Cholecalciferol (VITAMIN D3) 25 MCG (1000 UT) CAPS Take 1 capsule by mouth daily.   11/13/2022   citalopram (CELEXA) 20 MG tablet TAKE 1 TABLET BY MOUTH ONCE A DAY 90 tablet 0 11/13/2022   feeding supplement (ENSURE ENLIVE / ENSURE PLUS) LIQD Take 237 mLs by mouth 2 (two) times daily between meals. 237 mL 12 Past Week   ferrous sulfate 325 (65 FE) MG tablet Take 325 mg by mouth daily with breakfast.   11/13/2022   fluticasone (FLONASE) 50 MCG/ACT nasal spray Place 2 sprays into both nostrils daily as needed for allergies  (allergies).    unk at unk   guaiFENesin (MUCINEX) 600 MG 12 hr tablet Take 1 tablet (600 mg total) by mouth daily. 30 tablet 0 unk at unk   hydrALAZINE (APRESOLINE) 10 MG tablet Take 10 mg by mouth daily.   11/13/2022   levETIRAcetam (KEPPRA) 1000 MG tablet Take 1 tablet (1,000 mg total) by mouth 2 (two) times daily. 180 tablet 3 11/13/2022   loratadine (CLARITIN) 10 MG tablet TAKE 1 TABLET BY MOUTH ONCE A DAY AS NEEDED FOR ALLERGIES 30 tablet 3 unk at unk   metoCLOPramide (REGLAN) 10 MG tablet Take 1 tablet (10 mg total) by mouth every 6 (six) hours as needed. 30 tablet 0 Past Week   Multiple Vitamin (MULTIVITAMIN WITH MINERALS) TABS tablet Take 1 tablet by mouth daily. 30 tablet 1 11/13/2022   nystatin (MYCOSTATIN/NYSTOP) powder Apply 1 Application topically 3 (three) times daily. 60 g 1 Past Week   ondansetron (ZOFRAN-ODT) 4 MG disintegrating tablet Take 1 tablet (4 mg total) by mouth every 8 (eight) hours as needed. 20 tablet 0 unk at unk   pantoprazole (PROTONIX) 20 MG tablet TAKE 1 TABLET BY MOUTH ONCE A DAY 90 tablet 0 11/12/2022   polyethylene glycol (MIRALAX / GLYCOLAX) 17 g packet Take 17 g by mouth daily. 14 each 0 Past Week   torsemide (DEMADEX) 20 MG tablet Take 1 tablet (20 mg total) by mouth daily. 90 tablet 3 11/13/2022   traMADol (ULTRAM) 50 MG tablet Take 1 tablet (50 mg total) by mouth every 12 (twelve) hours as needed. 15 tablet 0 unk at unk   tamsulosin (FLOMAX) 0.4 MG CAPS capsule Take 0.4 mg by mouth daily. (Patient not taking: Reported on 11/13/2022)   Not Taking   Scheduled:   acetaminophen  650 mg Oral Q4H   cholecalciferol  1,000 Units Oral Daily   citalopram  20 mg Oral Daily   feeding supplement  237 mL Oral BID BM   ferrous sulfate  325 mg Oral Q breakfast   levETIRAcetam  1,000 mg Oral BID   pantoprazole  20 mg Oral Daily   polyethylene glycol  17 g Oral Daily  sodium chloride flush  3 mL Intravenous Q12H   torsemide  20 mg Oral Daily   Infusions:   cefTRIAXone  (ROCEPHIN)  IV 1 g (11/14/22 1942)   heparin 1,150 Units/hr (11/14/22 1945)   PRN: fluticasone, polyethylene glycol, traMADol Anti-infectives (From admission, onward)    Start     Dose/Rate Route Frequency Ordered Stop   11/14/22 2000  cefTRIAXone (ROCEPHIN) 1 g in sodium chloride 0.9 % 100 mL IVPB        1 g 200 mL/hr over 30 Minutes Intravenous Every 24 hours 11/13/22 2126 11/20/22 1959   11/13/22 2015  cefTRIAXone (ROCEPHIN) 1 g in sodium chloride 0.9 % 100 mL IVPB        1 g 200 mL/hr over 30 Minutes Intravenous  Once 11/13/22 2006 11/13/22 2146       Assessment: Pharmacy consulted to start heparin for VTE treatment. 72 y.o. female with medical history significant of remote CVA, presented to the ED due to weakness. 7 PM she reports having a new right thigh pain in the lateral thigh in the middle between the knee and the hip. No DOAC PTA.   [place] of leg:  1) Occlusive, expansile thrombus within 1 of the 2 paired posterior tibial veins within the infrapopliteal left lower extremity. This does not extend into the left popliteal vein. 2. No evidence of deep venous thrombosis within the right lower extremity.  [id number] 2138 HL 0.47.   Goal of Therapy:  Heparin level 0.3-0.7 units/ml Monitor platelets by anticoagulation protocol: Yes   Plan:  Heparin level is therapeutic. Will continue heparin infusion at 1150 units/hr. Recheck heparin level in 8 hours. CBC daily while on heparin. Recommend AM Rph get a co-pay check for apixaban. Would avoid using rivaroxaban due to CrCL < 30, but can still be an option since CrCl > 15 if its covered by insurance and apixaban is not.   Ronnald Ramp, PharmD, BCPS 11/14/2022,10:45 PM

## 2022-11-14 NOTE — ED Notes (Signed)
Pt daughter Coy Saunas updated on pt status and plan of care. Phone given to pt to speak with daughter.

## 2022-11-14 NOTE — Consult Note (Addendum)
ANTICOAGULATION CONSULT NOTE - Initial Consult  Pharmacy Consult for Heparin  Indication: DVT  Allergies  Allergen Reactions   Bactrim [Sulfamethoxazole-Trimethoprim] Other (See Comments)    Hematuria, dysuria   Latex Rash   Penicillin G Itching   Atorvastatin Other (See Comments)    Myalgias    Other Hives    Adhesive tape   Amlodipine Swelling    Leg swelling   Doxycycline     Blood in urine    Patient Measurements: Height: 5\' 1"  (154.9 cm) Weight: 85.3 kg (188 lb) IBW/kg (Calculated) : 47.8 Heparin Dosing Weight: 67.4 kg  Vital Signs: Temp: 98.7 F (37.1 C) (07/16 2026) Temp Source: Oral (07/16 2026) BP: 149/59 (07/17 0044) Pulse Rate: 61 (07/17 0044)  Labs: Recent Labs    11/13/22 1507 11/13/22 1950  HGB 12.8  --   HCT 38.0  --   PLT 154  --   CREATININE 2.67*  --   TROPONINIHS  --  23*    Estimated Creatinine Clearance: 18.6 mL/min (A) (by C-G formula based on SCr of 2.67 mg/dL (H)).   Medical History: Past Medical History:  Diagnosis Date   (HFpEF) heart failure with preserved ejection fraction (HCC)    a. Pt reports in 2000 she had CHF, details unclear, outside hospital; b. 06/2019 Echo: EF 50-55%, no rwma, Gr2 DD,  mildly dil LA, mild MR.   Acute left PCA stroke (HCC) 06/22/2014   Anxiety    a.) on BZO (alprazolam) PRN   CAD (coronary artery disease)    a. Pt reports in 2000 she had a stent to a coronary artery, details unclear, outside hospital.   Carotid stenosis    a. CT angio head 07/2014 - 50-60% stenoses of prox ICA bilaterally.   CKD (chronic kidney disease), stage IV (HCC)    Depression    Diabetes mellitus (HCC)    Denies.   Edentulous    No upper teeth   Family hx of colon cancer 06/13/2018   Father diagnosed in his 60's   Gout    History of cataract extraction, left 03/21/2021   HOH (hard of hearing)    usually needs to read lips   Hyperlipidemia    Hypertension    Intellectual disability    a.) born premature, limited  formal education, has never driven   Myalgia due to statin    Orthostatic hypotension    OSA (obstructive sleep apnea)    a.) not on nocturnal PAP therapy; old/nonfuntioning DME - referred for repeat PSG 01/2022 by neurology   Osteopenia 01/24/2018   DEXA Sept 2019   PONV (postoperative nausea and vomiting)    PTSD (post-traumatic stress disorder)        PUD (peptic ulcer disease)    PVC's (premature ventricular contractions)    Right sided weakness 05/2014   a.) s/p CVA   Seizures (HCC)    Sinus bradycardia    Syncope    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)     Medications:  (Not in a hospital admission)  Scheduled:   acetaminophen  650 mg Oral Q4H   enoxaparin (LOVENOX) injection  30 mg Subcutaneous Q24H   levETIRAcetam  1,000 mg Oral BID   sodium chloride flush  3 mL Intravenous Q12H   Infusions:   cefTRIAXone (ROCEPHIN)  IV     PRN: polyethylene glycol Anti-infectives (From admission, onward)    Start     Dose/Rate Route Frequency Ordered Stop   11/14/22 2000  cefTRIAXone (ROCEPHIN) 1 g in sodium chloride 0.9 % 100 mL IVPB        1 g 200 mL/hr over 30 Minutes Intravenous Every 24 hours 11/13/22 2126 11/20/22 1959   11/13/22 2015  cefTRIAXone (ROCEPHIN) 1 g in sodium chloride 0.9 % 100 mL IVPB        1 g 200 mL/hr over 30 Minutes Intravenous  Once 11/13/22 2006 11/13/22 2146       Assessment: Pharmacy consulted to start heparin for VTE treatment. 72 y.o. female with medical history significant of remote CVA, presented to the ED due to weakness. 7 PM she reports having a new right thigh pain in the lateral thigh in the middle between the knee and the hip. No DOAC PTA.   [place] of leg:  1) Occlusive, expansile thrombus within 1 of the 2 paired posterior tibial veins within the infrapopliteal left lower extremity. This does not extend into the left popliteal vein. 2. No evidence of deep venous thrombosis within the right lower extremity.  Goal of  Therapy:  Heparin level 0.3-0.7 units/ml Monitor platelets by anticoagulation protocol: Yes   Plan:  Give 4000 units bolus x 1 Start heparin infusion at 1000 units/hr Check anti-Xa level in 8 hours and daily while on heparin Continue to monitor H&H and platelets Enoxaparin discontinued.   Ronnald Ramp, PharmD, BCPS 11/14/2022,1:24 AM

## 2022-11-14 NOTE — Evaluation (Signed)
Physical Therapy Evaluation Patient Details Name: Leslie Houston MRN: [id number] DOB: 01/04/1951 Today's Date: 11/14/2022  History of Present Illness  72 year old female with remote CVA, complicated by right-sided weakness, CKD stage 4, chronic suprapubic catheter, CAD, chronic diastolic CHF, OSA presented to ED with generalized weakness.  Patient was in her usual state of health until 7/15 when she had the suprapubic cath catheter changed due to urinary retention, had tolerated the procedure well.  Patient then started having new right thigh pain in the evening.  Next morning, she noted the pain was worse when she tried to ambulate.  Clinical Impression  Pt pleasant and engaged with PT session, she was able to ambulate with relative ease (using walker) for ~175 ft, even doing ~25 feet mid way with single UE support (simulating light SPC use).  She maintained good O2 and HR t/o the session and c/o only minimal fatigue with the effort.  Pt reports not feeling too far from her baseline apart from continued vague R thigh and L (~rib6) chest/flank/back pain.  Pt with baseline R side weakness, this is consistent with decreased R leg ext and SLR weakness (still functionally moving against gravity) that she struggles to describe as more acute vs chronic.  Pt showed good effort, will benefit from continued PT to address functional limitations and insure appropriate d/c.       Assistance Recommended at Discharge Intermittent Supervision/Assistance  If plan is discharge home, recommend the following:  Can travel by private vehicle  A little help with bathing/dressing/bathroom;Assistance with cooking/housework;Assist for transportation;A little help with walking and/or transfers        Equipment Recommendations None recommended by PT  Recommendations for Other Services       Functional Status Assessment Patient has had a recent decline in their functional status and demonstrates the ability to make  significant improvements in function in a reasonable and predictable amount of time.     Precautions / Restrictions Precautions Precautions: Fall Restrictions Weight Bearing Restrictions: No      Mobility  Bed Mobility Overal bed mobility: Modified Independent             General bed mobility comments: Pt was slow to get to sitting EOB, but able to do so w/o assist, had a difficult time getting R LE back up into bed but ultimately did w/o assist.    Transfers Overall transfer level: Needs assistance Equipment used: Rolling walker (2 wheels) Transfers: Sit to/from Stand Sit to Stand: Min guard           General transfer comment: Cuing for set up, leveraged back of LEs on bed but able to get to standing multiple times w/o direct assist    Ambulation/Gait Ambulation/Gait assistance: Min guard Gait Distance (Feet): 175 Feet Assistive device: Rolling walker (2 wheels), 1 person hand held assist         General Gait Details: Pt was able to ambulate with good overall consistency and confidence.  No overt limp on the R with occasional minimal c/o R thigh pain.  Pt was able to ambulate ~25 ft with light single HHA and did not have any balance/safety issues, the remainder of th ~140ft with walker.  Pt's Vitals remained stable and appropriate t/o the effort and though she reports she's not fully back to baseline feels she is not far from it.  Stairs            Wheelchair Mobility     Tilt Bed    Modified  Rankin (Stroke Patients Only)       Balance Overall balance assessment: Modified Independent                                           Pertinent Vitals/Pain Pain Assessment Pain Assessment: 0-10 Pain Score: 4  Pain Location: inconsistent R thigh and L flank (~rib 6 level sternum to shoulder blade)    Home Living Family/patient expects to be discharged to:: Private residence Living Arrangements: Alone Available Help at Discharge:  Family Type of Home: Apartment Home Access: Level entry       Home Layout: One level Home Equipment: Cane - single Librarian, academic (2 wheels)      Prior Function Prior Level of Function : Independent/Modified Independent             Mobility Comments: SPC use in the home, walker community distances ADLs Comments: MOD I with ADL, daughter occasionally assist with laundry assist with IADL as needed     Hand Dominance        Extremity/Trunk Assessment   Upper Extremity Assessment Upper Extremity Assessment:  (baseline R sided weakness s/p CVA, R grossly 4-/5, L 4/5)    Lower Extremity Assessment Lower Extremity Assessment:  (L LE grossly 4/5, R grossly 4-/5 except 3/5 SLR and compound hip/knee extension)       Communication   Communication: HOH  Cognition Arousal/Alertness: Awake/alert Behavior During Therapy: WFL for tasks assessed/performed Overall Cognitive Status: Within Functional Limits for tasks assessed                                          General Comments General comments (skin integrity, edema, etc.): Pt struggled to really isolate specific pain (variously R thigh and L ribs) but reports that overall she functionally does not seem too off from her baseline    Exercises     Assessment/Plan    PT Assessment Patient needs continued PT services  PT Problem List Decreased strength;Decreased activity tolerance;Decreased range of motion;Decreased balance;Decreased mobility;Decreased knowledge of use of DME;Decreased safety awareness       PT Treatment Interventions DME instruction;Gait training;Functional mobility training;Therapeutic activities;Balance training;Therapeutic exercise;Patient/family education    PT Goals (Current goals can be found in the Care Plan section)  Acute Rehab PT Goals Patient Stated Goal: Go home PT Goal Formulation: With patient Time For Goal Achievement: 11/27/22 Potential to Achieve Goals: Good     Frequency Min 2X/week     Co-evaluation               AM-PAC PT "6 Clicks" Mobility  Outcome Measure Help needed turning from your back to your side while in a flat bed without using bedrails?: None Help needed moving from lying on your back to sitting on the side of a flat bed without using bedrails?: A Little Help needed moving to and from a bed to a chair (including a wheelchair)?: A Little Help needed standing up from a chair using your arms (e.g., wheelchair or bedside chair)?: A Little Help needed to walk in hospital room?: A Little Help needed climbing 3-5 steps with a railing? : A Little 6 Click Score: 19    End of Session Equipment Utilized During Treatment: Gait belt Activity Tolerance: Patient tolerated treatment well Patient left: in  bed;with call bell/phone within reach Nurse Communication: Mobility status (L rib pain) PT Visit Diagnosis: Muscle weakness (generalized) (M62.81);Difficulty in walking, not elsewhere classified (R26.2);Pain Pain - Right/Left: Right Pain - part of body: Hip    Time: 1415-1445 PT Time Calculation (min) (ACUTE ONLY): 30 min   Charges:   PT Evaluation $PT Eval Low Complexity: 1 Low PT Treatments $Gait Training: 8-22 mins PT General Charges $$ ACUTE PT VISIT: 1 Visit         Malachi Pro, DPT 11/14/2022, 4:01 PM

## 2022-11-14 NOTE — ED Notes (Signed)
Pt to CT at this time.

## 2022-11-14 NOTE — Plan of Care (Signed)

## 2022-11-14 NOTE — ED Notes (Signed)
Pt assisted with personal care, partial bed bath given, gown placed on pt. Pt denies further needs at this time. WCTM.

## 2022-11-15 ENCOUNTER — Other Ambulatory Visit (HOSPITAL_COMMUNITY): Payer: Self-pay

## 2022-11-15 DIAGNOSIS — R519 Headache, unspecified: Secondary | ICD-10-CM | POA: Diagnosis not present

## 2022-11-15 DIAGNOSIS — N390 Urinary tract infection, site not specified: Secondary | ICD-10-CM | POA: Diagnosis not present

## 2022-11-15 DIAGNOSIS — R531 Weakness: Secondary | ICD-10-CM | POA: Diagnosis not present

## 2022-11-15 DIAGNOSIS — I130 Hypertensive heart and chronic kidney disease with heart failure and stage 1 through stage 4 chronic kidney disease, or unspecified chronic kidney disease: Secondary | ICD-10-CM | POA: Diagnosis not present

## 2022-11-15 DIAGNOSIS — I82402 Acute embolism and thrombosis of unspecified deep veins of left lower extremity: Secondary | ICD-10-CM | POA: Diagnosis not present

## 2022-11-15 DIAGNOSIS — I4581 Long QT syndrome: Secondary | ICD-10-CM | POA: Diagnosis not present

## 2022-11-15 DIAGNOSIS — I82442 Acute embolism and thrombosis of left tibial vein: Secondary | ICD-10-CM | POA: Diagnosis not present

## 2022-11-15 DIAGNOSIS — R001 Bradycardia, unspecified: Secondary | ICD-10-CM | POA: Diagnosis not present

## 2022-11-15 DIAGNOSIS — M79651 Pain in right thigh: Secondary | ICD-10-CM | POA: Diagnosis not present

## 2022-11-15 DIAGNOSIS — N184 Chronic kidney disease, stage 4 (severe): Secondary | ICD-10-CM | POA: Diagnosis not present

## 2022-11-15 DIAGNOSIS — R29898 Other symptoms and signs involving the musculoskeletal system: Secondary | ICD-10-CM | POA: Diagnosis not present

## 2022-11-15 DIAGNOSIS — I5032 Chronic diastolic (congestive) heart failure: Secondary | ICD-10-CM | POA: Diagnosis not present

## 2022-11-15 DIAGNOSIS — E1122 Type 2 diabetes mellitus with diabetic chronic kidney disease: Secondary | ICD-10-CM | POA: Diagnosis not present

## 2022-11-15 LAB — URINE CULTURE

## 2022-11-15 LAB — BASIC METABOLIC PANEL
Anion gap: 9 (ref 5–15)
BUN: 40 mg/dL — ABNORMAL HIGH (ref 8–23)
CO2: 21 mmol/L — ABNORMAL LOW (ref 22–32)
Calcium: 8.8 mg/dL — ABNORMAL LOW (ref 8.9–10.3)
Chloride: 105 mmol/L (ref 98–111)
Creatinine, Ser: 2.66 mg/dL — ABNORMAL HIGH (ref 0.44–1.00)
GFR, Estimated: 18 mL/min — ABNORMAL LOW (ref >60)
Glucose, Bld: 97 mg/dL (ref 70–99)
Potassium: 4 mmol/L (ref 3.5–5.1)
Sodium: 135 mmol/L (ref 135–145)

## 2022-11-15 LAB — CBC
HCT: 34.7 % — ABNORMAL LOW (ref 36.0–46.0)
Hemoglobin: 11.6 g/dL — ABNORMAL LOW (ref 12.0–15.0)
MCH: 32.8 pg (ref 26.0–34.0)
MCHC: 33.4 g/dL (ref 30.0–36.0)
MCV: 98 fL (ref 80.0–100.0)
Platelets: 173 10*3/uL (ref 150–400)
RBC: 3.54 MIL/uL — ABNORMAL LOW (ref 3.87–5.11)
RDW: 11.9 % (ref 11.5–15.5)
WBC: 3.1 10*3/uL — ABNORMAL LOW (ref 4.0–10.5)
nRBC: 0 % (ref 0.0–0.2)

## 2022-11-15 LAB — HEPARIN LEVEL (UNFRACTIONATED): Heparin Unfractionated: 0.59 IU/mL (ref 0.30–0.70)

## 2022-11-15 LAB — TROPONIN I (HIGH SENSITIVITY): Troponin I (High Sensitivity): 13 ng/L (ref <18)

## 2022-11-15 LAB — CK: Total CK: 29 U/L — ABNORMAL LOW (ref 38–234)

## 2022-11-15 MED ORDER — NITROGLYCERIN 0.4 MG SL SUBL
0.4000 mg | SUBLINGUAL_TABLET | SUBLINGUAL | Status: DC | PRN
  Administered 2022-11-15: 0.4 mg via SUBLINGUAL
  Filled 2022-11-15: qty 1

## 2022-11-15 MED ORDER — LEVETIRACETAM 500 MG PO TABS
500.0000 mg | ORAL_TABLET | Freq: Two times a day (BID) | ORAL | Status: DC
  Administered 2022-11-15 – 2022-11-17 (×4): 500 mg via ORAL
  Filled 2022-11-15 (×4): qty 1

## 2022-11-15 MED ORDER — APIXABAN 5 MG PO TABS
10.0000 mg | ORAL_TABLET | Freq: Two times a day (BID) | ORAL | Status: DC
  Administered 2022-11-15 – 2022-11-17 (×4): 10 mg via ORAL
  Filled 2022-11-15 (×5): qty 2

## 2022-11-15 MED ORDER — APIXABAN 5 MG PO TABS: 5.0000 mg | ORAL_TABLET | Freq: Two times a day (BID) | ORAL | Status: DC

## 2022-11-15 NOTE — Progress Notes (Signed)
[facility]                                                                              Leslie Houston, is a 72 y.o. female, DOB - 10-25-1950, WJX:[id number] Admit date - 11/13/2022    Outpatient Primary MD for the patient is [name], DO  LOS - 1  days  Chief Complaint  Patient presents with   Weakness       Brief summary   Patient is a 72 year old female with remote CVA, complicated by right-sided weakness, CKD stage 4, chronic suprapubic catheter, CAD, chronic diastolic CHF, OSA presented to ED with generalized weakness.  Patient was in her usual state of health until 7/15 when she had the suprapubic cath catheter changed due to urinary retention, had tolerated the procedure well.  Patient then started having new right thigh pain in the evening.  Next morning, she noted the pain was worse when she tried to ambulate.  No fall or any trauma, no back pain.  Also reported headache, nausea, vomiting, diarrhea, abdominal pain/suprapubic.  No focal weakness, vision changes. in ED, CK 36, troponins negative  CT abdomen showed no acute findings, suprapubic catheter decompresses urinary bladder.  Chronic bilateral renal parenchymal atrophy with mild distention of the renal collecting systems, no urolithiasis. CT head showed no acute intracranial abnormality, secretions in the right sphenoid sinus, correlate with acute sinusitis.  Venous Doppler lower extremity showed occlusive, expansile thrombus in the posterior tibial vein within the infrapopliteal left lower extremity, does not extend into the left popliteal vein.  No evidence of DVT in the right lower extremity  Assessment & Plan    Principal Problem:   Right Thigh pain -Unclear etiology, CK normal.  Venous Doppler with no evidence of DVT in the right lower extremity -On exam, patient was complaining of pain and not able to lift up, no fall or trauma  -PT OT evaluation -> HHPT  -CT right hip and femur  negative for any abscess or infectious etiology, no fracture or dislocation -Today feeling better, continue PT OT   Active Problems:  Acute LLE DVT  - started on IV heparin drip -Will transition to DOAC preferably eliquis, awaiting insurance co-pay cost  Atypical chest pain -Complaining of chest pain, appears musculoskeletal, state worse with movements.  Currently on IV heparin drip for DVT. -CK20 9, troponin 13.  EKG showed no acute ST-T wave changes suggestive of ischemia.    Recurrent UTI, CAUTI -Continue IV Rocephin -Urine culture showed multiple species     Essential hypertension -BP currently stable     CKD (chronic kidney disease) stage 4, GFR 15-29 ml/min (HCC) -Baseline creatinine 2.4-3.5 -Currently at baseline, creatinine 2.4 on 7/17-> 2.6 today -On torsemide, will hold    History of CVA (cerebrovascular accident) -CT head negative, no focal weakness    Urinary retention, history of chronic suprapubic catheter -Recently treated for UTI patient 11/12/18    OSA (obstructive sleep apnea)     Seizure disorder (HCC) -Continue Keppra    GERD without esophagitis -Continue PPI    Morbid obesity (HCC) Estimated body mass index is 35.52 kg/m as calculated  from the following:   Height as of this encounter: 5\' 1"  (1.549 m).   Weight as of this encounter: 85.3 kg.  Code Status: Full code DVT Prophylaxis:  SCDs Start: 11/13/22 2138   Level of Care: Level of care: Med-Surg Family Communication: Updated patient Disposition Plan:      Remains inpatient appropriate: Workup in progress, hopefully DC home in a.m.   Procedures:  Venous Doppler lower extremity  Consultants:     Antimicrobials:   Anti-infectives (From admission, onward)    Start     Dose/Rate Route Frequency Ordered Stop   11/14/22 2000  cefTRIAXone (ROCEPHIN) 1 g in sodium chloride 0.9 % 100 mL IVPB        1 g 200 mL/hr over 30 Minutes Intravenous Every 24 hours 11/13/22 2126 11/20/22 1959    11/13/22 2015  cefTRIAXone (ROCEPHIN) 1 g in sodium chloride 0.9 % 100 mL IVPB        1 g 200 mL/hr over 30 Minutes Intravenous  Once 11/13/22 2006 11/13/22 2146          Medications  acetaminophen  650 mg Oral Q4H   cholecalciferol  1,000 Units Oral Daily   citalopram  20 mg Oral Daily   feeding supplement  237 mL Oral BID BM   ferrous sulfate  325 mg Oral Q breakfast   levETIRAcetam  500 mg Oral BID   pantoprazole  20 mg Oral Daily   polyethylene glycol  17 g Oral Daily   sodium chloride flush  3 mL Intravenous Q12H      Subjective:   Leslie Houston was seen and examined today.  Complaining of chest pain, worse with moving to either side, also noted chest pain with palpation.  No complaints of right thigh pain today.  No dizziness, shortness of breath, fever chills nausea or vomiting.    Objective:   Vitals:   11/14/22 2053 11/15/22 0459 11/15/22 0749 11/15/22 0920  BP: (!) 173/69 (!) 150/54 (!) 169/69 (!) 152/57  Pulse: (!) 50 (!) 50 (!) 52 (!) 55  Resp: 18 18 19 18   Temp: (!) 97.5 F (36.4 C) 97.7 F (36.5 C) 97.8 F (36.6 C) 97.7 F (36.5 C)  TempSrc: Oral Oral  Oral  SpO2: 95% 94% 96% 97%  Weight:      Height:        Intake/Output Summary (Last 24 hours) at 11/15/2022 1315 Last data filed at 11/15/2022 0512 Gross per 24 hour  Intake 684.52 ml  Output 1400 ml  Net -715.48 ml     Wt Readings from Last 3 Encounters:  11/13/22 85.3 kg  11/12/22 85.3 kg  10/12/22 82.6 kg   Physical Exam General: Alert and oriented x 3, NAD Cardiovascular: S1 S2 clear, RRR.  Respiratory: CTAB, no wheezing, + chest wall TTP Gastrointestinal: Soft, nontender, nondistended, NBS Ext: no pedal edema bilaterally Neuro: no new deficits Psych: Normal affect   Data Reviewed:  I have personally reviewed following labs    CBC Lab Results  Component Value Date   WBC 3.1 (L) 11/15/2022   RBC 3.54 (L) 11/15/2022   HGB 11.6 (L) 11/15/2022   HCT 34.7 (L) 11/15/2022    MCV 98.0 11/15/2022   MCH 32.8 11/15/2022   PLT 173 11/15/2022   MCHC 33.4 11/15/2022   RDW 11.9 11/15/2022   LYMPHSABS 1,294 05/03/2022   MONOABS 0.4 03/14/2022   EOSABS 90 05/03/2022   BASOSABS 22 05/03/2022     Last metabolic panel Lab Results  Component Value Date   NA 135 11/15/2022   K 4.0 11/15/2022   CL 105 11/15/2022   CO2 21 (L) 11/15/2022   BUN 40 (H) 11/15/2022   CREATININE 2.66 (H) 11/15/2022   GLUCOSE 97 11/15/2022   GFRNONAA 18 (L) 11/15/2022   GFRAA 23 (L) 05/12/2020   CALCIUM 8.8 (L) 11/15/2022   PHOS 2.7 11/13/2022   PROT 7.5 11/13/2022   ALBUMIN 3.4 (L) 11/13/2022   BILITOT 0.9 11/13/2022   ALKPHOS 88 11/13/2022   AST 31 11/13/2022   ALT 23 11/13/2022   ANIONGAP 9 11/15/2022    CBG (last 3)  No results for input(s): "GLUCAP" in the last 72 hours.    Coagulation Profile: Recent Labs  Lab 11/14/22 0748  INR 1.3*     Radiology Studies: I have personally reviewed the imaging studies  CT HIP RIGHT WO CONTRAST  Result Date: 11/14/2022 CLINICAL DATA:  Right leg weakness. EXAM: CT OF THE RIGHT HIP AND FEMUR WITHOUT CONTRAST TECHNIQUE: Multidetector CT imaging of the right hip and femur was performed according to the standard protocol. Multiplanar CT image reconstructions were also generated. RADIATION DOSE REDUCTION: This exam was performed according to the departmental dose-optimization program which includes automated exposure control, adjustment of the mA and/or kV according to patient size and/or use of iterative reconstruction technique. COMPARISON:  CT abdomen pelvis from yesterday. FINDINGS: Bones/Joint/Cartilage No fracture or dislocation. Mild right hip and moderate right knee degenerative changes. Small right knee joint effusion. Ligaments Ligaments are suboptimally evaluated by CT. Muscles and Tendons Grossly intact. Soft tissue No fluid collection or hematoma.  No soft tissue mass. IMPRESSION: 1. No acute osseous abnormality. 2. Mild right hip  and moderate right knee osteoarthritis. Electronically Signed   By: Obie Dredge M.D.   On: 11/14/2022 13:42   CT FEMUR RIGHT WO CONTRAST  Result Date: 11/14/2022 CLINICAL DATA:  Right leg weakness. EXAM: CT OF THE RIGHT HIP AND FEMUR WITHOUT CONTRAST TECHNIQUE: Multidetector CT imaging of the right hip and femur was performed according to the standard protocol. Multiplanar CT image reconstructions were also generated. RADIATION DOSE REDUCTION: This exam was performed according to the departmental dose-optimization program which includes automated exposure control, adjustment of the mA and/or kV according to patient size and/or use of iterative reconstruction technique. COMPARISON:  CT abdomen pelvis from yesterday. FINDINGS: Bones/Joint/Cartilage No fracture or dislocation. Mild right hip and moderate right knee degenerative changes. Small right knee joint effusion. Ligaments Ligaments are suboptimally evaluated by CT. Muscles and Tendons Grossly intact. Soft tissue No fluid collection or hematoma.  No soft tissue mass. IMPRESSION: 1. No acute osseous abnormality. 2. 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 Electronically Signed   By: Obie Dredge M.D.   On: 11/14/2022 13:42   US Venous Img Lower Bilateral (DVT)  Result Date: 11/14/2022 CLINICAL DATA:  Bilateral leg pain EXAM: BILATERAL LOWER EXTREMITY VENOUS DOPPLER ULTRASOUND TECHNIQUE: Gray-scale sonography with compression, as well as color and duplex ultrasound, were performed to evaluate the deep venous system(s) from the level of the common femoral vein through the popliteal and proximal calf veins. COMPARISON:  None Available. FINDINGS: VENOUS Normal compressibility of the common femoral, superficial femoral, and popliteal veins, as well as the visualized right calf veins. Visualized portions of profunda femoral vein and great saphenous vein unremarkable. No filling defects to suggest DVT on grayscale or color Doppler imaging  within these vessels. Doppler waveforms show normal direction of venous flow, normal respiratory plasticity and response to  augmentation. Within the left lower extremity, there is, however, occlusive, expansile thrombus seen within 1 of the 2 paired posterior tibial veins within the infrapopliteal left lower extremity. This does not extend into the left popliteal vein. The paired, visualized peroneal veins are patent with appropriate antegrade flow. OTHER None. Limitations: none IMPRESSION: 1. Occlusive, expansile thrombus within 1 of the 2 paired posterior tibial veins within the infrapopliteal left lower extremity. This does not extend into the left popliteal vein. 2. No evidence of deep venous thrombosis within the right lower extremity. Electronically Signed   By: Helyn Numbers M.D.   On: 11/14/2022 00:42   DG FEMUR PORT, MIN 2 VIEWS RIGHT  Result Date: 11/13/2022 CLINICAL DATA:  Right leg pain EXAM: RIGHT FEMUR PORTABLE 2 VIEW COMPARISON:  None Available. FINDINGS: Degenerative changes are demonstrated in the right hip and right knee. No evidence of acute fracture or dislocation. No focal bone lesion or bone destruction. Vascular calcifications in the soft tissues. IMPRESSION: Degenerative changes in the right hip and right knee. No acute bony abnormalities. Electronically Signed   By: Burman Nieves M.D.   On: 11/13/2022 22:05   CT ABDOMEN PELVIS WO CONTRAST  Result Date: 11/13/2022 CLINICAL DATA:  Abdominal pain, acute, nonlocalized EXAM: CT ABDOMEN AND PELVIS WITHOUT CONTRAST TECHNIQUE: Multidetector CT imaging of the abdomen and pelvis was performed following the standard protocol without IV contrast. RADIATION DOSE REDUCTION: This exam was performed according to the departmental dose-optimization program which includes automated exposure control, adjustment of the mA and/or kV according to patient size and/or use of iterative reconstruction technique. COMPARISON:  09/02/2022 FINDINGS: Lower  chest: No pleural or pericardial effusion. Stable linear scarring in the right lung base. Hepatobiliary: No focal liver abnormality is seen. No gallstones, gallbladder wall thickening, or biliary dilatation. Pancreas: Unremarkable. No pancreatic ductal dilatation or surrounding inflammatory changes. Spleen: Normal in size without focal abnormality. Adrenals/Urinary Tract: No adrenal mass. Chronic bilateral renal parenchymal atrophy with mild distention of the renal collecting systems, no urolithiasis. Urinary bladder decompressed by suprapubic catheter which appears well positioned. Stomach/Bowel: Stomach incompletely distended, unremarkable. Small bowel decompressed. Normal appendix. Colon is incompletely distended, without acute finding. Vascular/Lymphatic: Scattered aortoiliac calcified plaque without aneurysm. Stable subcentimeter left para-aortic and aortocaval lymph nodes. No abdominal or pelvic adenopathy. Reproductive: Uterus and bilateral adnexa are unremarkable. Other: No ascites.  No free air. Musculoskeletal: Mild spondylitic changes throughout the visualized lower thoracic and lumbar spine. No fracture or worrisome bone lesion. IMPRESSION: 1. No acute findings. Suprapubic catheter decompresses urinary bladder. 2. Chronic bilateral renal parenchymal atrophy with mild distention of the renal collecting systems, no urolithiasis. 3.  Aortic Atherosclerosis (ICD10-I70.0). Electronically Signed   By: Corlis Leak M.D.   On: 11/13/2022 17:07   CT Head Wo Contrast  Result Date: 11/13/2022 CLINICAL DATA:  Headache, new onset (Age >= 51y) EXAM: CT HEAD WITHOUT CONTRAST TECHNIQUE: Contiguous axial images were obtained from the base of the skull through the vertex without intravenous contrast. RADIATION DOSE REDUCTION: This exam was performed according to the departmental dose-optimization program which includes automated exposure control, adjustment of the mA and/or kV according to patient size and/or use of  iterative reconstruction technique. COMPARISON:  CT Head 09/11/22 FINDINGS: Brain: No hemorrhage. No hydrocephalus. No extra-axial fluid collection. Chronic left occipital lobe infarct. Ex vacuo dilatation of the left occipital horn. No CT evidence of an acute cortical infarct. Vascular: No hyperdense vessel or unexpected calcification. Skull: Normal. Negative for fracture or focal lesion. Sinuses/Orbits: No middle  ear or mastoid effusion. Paranasal sinuses are notable frothy secretions in the right sphenoid sinus. Left lens replacement. Orbits are otherwise unremarkable. Other: None. IMPRESSION: 1. No acute intracranial abnormality. 2. Frothy secretions in the right sphenoid sinus. Correlate for symptoms of acute sinusitis. Electronically Signed   By: Lorenza Cambridge M.D.   On: 11/13/2022 16:58       Treven Holtman M.D. [facility] 11/15/2022, 1:15 PM  Available via Epic secure chat 7am-7pm After 7 pm, please refer to night coverage provider listed on amion.

## 2022-11-15 NOTE — Evaluation (Signed)
Occupational Therapy Evaluation Patient Details Name: Leslie Houston MRN: [id number] DOB: 01/26/1951 Today's Date: 11/15/2022   History of Present Illness 72 year old female with remote CVA, complicated by right-sided weakness, CKD stage 4, chronic suprapubic catheter, CAD, chronic diastolic CHF, OSA presented to ED with generalized weakness.  Patient was in her usual state of health until 7/15 when she had the suprapubic cath catheter changed due to urinary retention, had tolerated the procedure well.  Patient then started having new right thigh pain in the evening.  Next morning, she noted the pain was worse when she tried to ambulate.   Clinical Impression   Chart reviewed, pt greeted in bed, agreeable to OT evaluation. Pt is alert and oriented x4, ?deficits in awareness. PTA pt reports generally MOD I with ADL, assist with IADL as needed, amb with SPC/ RW community distances. Pt presents with deficits in activity tolerance and strength affecting optimal ADL completion. Pt with L sided chest pain with mobility (although ?report as she reports it improves with standing). Nurse in room to assess. OT will aim to progress functional mobility as appropriate in future sessions. OT will continue to follow acutely.      Recommendations for follow up therapy are one component of a multi-disciplinary discharge planning process, led by the attending physician.  Recommendations may be updated based on patient status, additional functional criteria and insurance authorization.   Assistance Recommended at Discharge Intermittent Supervision/Assistance  Patient can return home with the following A little help with walking and/or transfers;A little help with bathing/dressing/bathroom;Direct supervision/assist for medications management    Functional Status Assessment  Patient has had a recent decline in their functional status and demonstrates the ability to make significant improvements in function in a  reasonable and predictable amount of time.  Equipment Recommendations  None recommended by OT    Recommendations for Other Services       Precautions / Restrictions Precautions Precautions: Fall Restrictions Weight Bearing Restrictions: No      Mobility Bed Mobility Overal bed mobility: Needs Assistance Bed Mobility: Supine to Sit, Sit to Supine     Supine to sit: Supervision Sit to supine: Supervision        Transfers Overall transfer level: Needs assistance Equipment used: Rolling walker (2 wheels), 1 person hand held assist Transfers: Sit to/from Stand Sit to Stand: Min guard, Min assist (MIN A with HHA, CGA with RW)           General transfer comment: two lateral steps up the bed to the left with HHA with MIN A      Balance Overall balance assessment: Needs assistance Sitting-balance support: Feet supported Sitting balance-Leahy Scale: Normal     Standing balance support: Single extremity supported Standing balance-Leahy Scale: Good                             ADL either performed or assessed with clinical judgement   ADL Overall ADL's : Needs assistance/impaired Eating/Feeding: Set up;Sitting   Grooming: Wash/dry face;Sitting;Set up               Lower Body Dressing: Set up Lower Body Dressing Details (indicate cue type and reason): socks                     Vision Baseline Vision/History: 1 Wears glasses Patient Visual Report: No change from baseline       Perception     Praxis  Pertinent Vitals/Pain Pain Assessment Pain Assessment: 0-10 Pain Score: 4  Pain Location: inconsistent L flank pain to shoulder after mobility out of bed Pain Descriptors / Indicators: Heaviness Pain Intervention(s): Limited activity within patient's tolerance, Repositioned (RN notified, in room to assess)     Hand Dominance     Extremity/Trunk Assessment Upper Extremity Assessment Upper Extremity Assessment:  (baseline R  sided weakness, grossly 4/5 throughout)   Lower Extremity Assessment Lower Extremity Assessment: Defer to PT evaluation   Cervical / Trunk Assessment Cervical / Trunk Assessment: Normal   Communication     Cognition Arousal/Alertness: Awake/alert Behavior During Therapy: WFL for tasks assessed/performed Overall Cognitive Status: No family/caregiver present to determine baseline cognitive functioning Area of Impairment: Problem solving, Awareness                           Awareness: Emergent Problem Solving: Requires verbal cues       General Comments  Vitals 182/57, HR 55 bpm, spo2 97% taken by nurse after reporting chest pressure/pain    Exercises     Shoulder Instructions      Home Living Family/patient expects to be discharged to:: Private residence Living Arrangements: Alone Available Help at Discharge: Family Type of Home: Apartment Home Access: Level entry     Home Layout: One level     Bathroom Shower/Tub: Producer, television/film/video: Handicapped height Bathroom Accessibility: Yes   Home Equipment: Cane - single Librarian, academic (2 wheels)          Prior Functioning/Environment Prior Level of Function : Independent/Modified Independent             Mobility Comments: spc household distances, RW for community distances ADLs Comments: MOD I with ADL per pt report, family assist for IADLs        OT Problem List: Decreased strength;Impaired balance (sitting and/or standing);Decreased knowledge of use of DME or AE;Decreased cognition      OT Treatment/Interventions: Self-care/ADL training;Balance training;Therapeutic exercise;DME and/or AE instruction;Therapeutic activities    OT Goals(Current goals can be found in the care plan section) Acute Rehab OT Goals Patient Stated Goal: go home OT Goal Formulation: With patient Time For Goal Achievement: 11/29/22 Potential to Achieve Goals: Good ADL Goals Pt Will Perform Grooming:  with modified independence Pt Will Perform Lower Body Dressing: with modified independence Pt Will Transfer to Toilet: with modified independence Pt Will Perform Toileting - Clothing Manipulation and hygiene: with modified independence  OT Frequency: Min 1X/week    Co-evaluation              AM-PAC OT "6 Clicks" Daily Activity     Outcome Measure Help from another person eating meals?: None Help from another person taking care of personal grooming?: None Help from another person toileting, which includes using toliet, bedpan, or urinal?: A Little Help from another person bathing (including washing, rinsing, drying)?: A Little Help from another person to put on and taking off regular upper body clothing?: None Help from another person to put on and taking off regular lower body clothing?: None 6 Click Score: 22   End of Session Equipment Utilized During Treatment: Gait belt Nurse Communication: Mobility status  Activity Tolerance: Other (comment) (further mobility limited by reported chest pain) Patient left: in bed;with call bell/phone within reach;with bed alarm set;with SCD's reapplied;with nursing/sitter in room  OT Visit Diagnosis: Other abnormalities of gait and mobility (R26.89)  Time: 1610-9604 OT Time Calculation (min): 15 min Charges:  OT General Charges $OT Visit: 1 Visit OT Evaluation $OT Eval Low Complexity: 1 Low  Oleta Mouse, OTD OTR/L  11/15/22, 11:05 AM

## 2022-11-15 NOTE — Progress Notes (Signed)
ANTICOAGULATION CONSULT NOTE - Initial Consult  Pharmacy Consult for apixaban Indication: DVT/PE  Allergies  Allergen Reactions   Bactrim [Sulfamethoxazole-Trimethoprim] Other (See Comments)    Hematuria, dysuria   Latex Rash   Penicillin G Itching   Atorvastatin Other (See Comments)    Myalgias    Other Hives    Adhesive tape   Amlodipine Swelling    Leg swelling   Doxycycline     Blood in urine    Patient Measurements: Height: 5\' 1"  (154.9 cm) Weight: 85.3 kg (188 lb) IBW/kg (Calculated) : 47.8 Heparin Dosing Weight:    Vital Signs: Temp: 97.7 F (36.5 C) (07/18 0920) Temp Source: Oral (07/18 0920) BP: 152/57 (07/18 0920) Pulse Rate: 55 (07/18 0920)  Labs: Recent Labs    11/13/22 1507 11/13/22 1950 11/14/22 0748 11/14/22 1122 11/14/22 2138 11/15/22 0422 11/15/22 0607 11/15/22 1023  HGB 12.8  --  11.7*  --   --  11.6*  --   --   HCT 38.0  --  36.0  --   --  34.7*  --   --   PLT 154  --  157  --   --  173  --   --   APTT  --   --  92*  --   --   --   --   --   LABPROT  --   --  16.5*  --   --   --   --   --   INR  --   --  1.3*  --   --   --   --   --   HEPARINUNFRC  --   --   --  0.25* 0.47  --  0.59  --   CREATININE 2.67*  --  2.43*  --   --  2.66*  --   --   CKTOTAL  --   --  36*  --   --   --   --  29*  TROPONINIHS  --  23* 15  --   --   --   --  13    Estimated Creatinine Clearance: 18.7 mL/min (A) (by C-G formula based on SCr of 2.66 mg/dL (H)).   Medical History: Past Medical History:  Diagnosis Date   (HFpEF) heart failure with preserved ejection fraction (HCC)    a. Pt reports in 2000 she had CHF, details unclear, outside hospital; b. 06/2019 Echo: EF 50-55%, no rwma, Gr2 DD,  mildly dil LA, mild MR.   Acute left PCA stroke (HCC) 06/22/2014   Anxiety    a.) on BZO (alprazolam) PRN   CAD (coronary artery disease)    a. Pt reports in 2000 she had a stent to a coronary artery, details unclear, outside hospital.   Carotid stenosis    a. CT  angio head 07/2014 - 50-60% stenoses of prox ICA bilaterally.   CKD (chronic kidney disease), stage IV (HCC)    Depression    Diabetes mellitus (HCC)    Denies.   Edentulous    No upper teeth   Family hx of colon cancer 06/13/2018   Father diagnosed in his 52's   Gout    History of cataract extraction, left 03/21/2021   HOH (hard of hearing)    usually needs to read lips   Hyperlipidemia    Hypertension    Intellectual disability    a.) born premature, limited formal education, has never driven   Myalgia due to statin  Orthostatic hypotension    OSA (obstructive sleep apnea)    a.) not on nocturnal PAP therapy; old/nonfuntioning DME - referred for repeat PSG 01/2022 by neurology   Osteopenia 01/24/2018   DEXA Sept 2019   PONV (postoperative nausea and vomiting)    PTSD (post-traumatic stress disorder)        PUD (peptic ulcer disease)    PVC's (premature ventricular contractions)    Right sided weakness 05/2014   a.) s/p CVA   Seizures (HCC)    Sinus bradycardia    Syncope    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)     Medications:  Medications Prior to Admission  Medication Sig Dispense Refill Last Dose   Alirocumab (PRALUENT) 150 MG/ML SOAJ Inject 1 mL (150 mg total) into the skin every 14 (fourteen) days. 2 mL 2 Past Month   aluminum-magnesium hydroxide-simethicone (MAALOX) 200-200-20 MG/5ML SUSP Take 30 mLs by mouth 4 (four) times daily -  before meals and at bedtime. 355 mL 0 unk at unk   benzonatate (TESSALON) 100 MG capsule Take 1 capsule (100 mg total) by mouth 2 (two) times daily as needed for cough. 20 capsule 0 unk at un   Cholecalciferol (VITAMIN D3) 25 MCG (1000 UT) CAPS Take 1 capsule by mouth daily.   11/13/2022   citalopram (CELEXA) 20 MG tablet TAKE 1 TABLET BY MOUTH ONCE A DAY 90 tablet 0 11/13/2022   feeding supplement (ENSURE ENLIVE / ENSURE PLUS) LIQD Take 237 mLs by mouth 2 (two) times daily between meals. 237 mL 12 Past Week    ferrous sulfate 325 (65 FE) MG tablet Take 325 mg by mouth daily with breakfast.   11/13/2022   fluticasone (FLONASE) 50 MCG/ACT nasal spray Place 2 sprays into both nostrils daily as needed for allergies (allergies).    unk at unk   guaiFENesin (MUCINEX) 600 MG 12 hr tablet Take 1 tablet (600 mg total) by mouth daily. 30 tablet 0 unk at unk   hydrALAZINE (APRESOLINE) 10 MG tablet Take 10 mg by mouth daily.   11/13/2022   levETIRAcetam (KEPPRA) 1000 MG tablet Take 1 tablet (1,000 mg total) by mouth 2 (two) times daily. 180 tablet 3 11/13/2022   loratadine (CLARITIN) 10 MG tablet TAKE 1 TABLET BY MOUTH ONCE A DAY AS NEEDED FOR ALLERGIES 30 tablet 3 unk at unk   metoCLOPramide (REGLAN) 10 MG tablet Take 1 tablet (10 mg total) by mouth every 6 (six) hours as needed. 30 tablet 0 Past Week   Multiple Vitamin (MULTIVITAMIN WITH MINERALS) TABS tablet Take 1 tablet by mouth daily. 30 tablet 1 11/13/2022   nystatin (MYCOSTATIN/NYSTOP) powder Apply 1 Application topically 3 (three) times daily. 60 g 1 Past Week   ondansetron (ZOFRAN-ODT) 4 MG disintegrating tablet Take 1 tablet (4 mg total) by mouth every 8 (eight) hours as needed. 20 tablet 0 unk at unk   pantoprazole (PROTONIX) 20 MG tablet TAKE 1 TABLET BY MOUTH ONCE A DAY 90 tablet 0 11/12/2022   polyethylene glycol (MIRALAX / GLYCOLAX) 17 g packet Take 17 g by mouth daily. 14 each 0 Past Week   torsemide (DEMADEX) 20 MG tablet Take 1 tablet (20 mg total) by mouth daily. 90 tablet 3 11/13/2022   traMADol (ULTRAM) 50 MG tablet Take 1 tablet (50 mg total) by mouth every 12 (twelve) hours as needed. 15 tablet 0 unk at unk   tamsulosin (FLOMAX) 0.4 MG CAPS capsule Take 0.4 mg by mouth daily. (Patient not taking:  Reported on 11/13/2022)   Not Taking   Scheduled:   acetaminophen  650 mg Oral Q4H   apixaban  10 mg Oral BID   Followed by   Melene Muller ON 11/22/2022] apixaban  5 mg Oral BID   cholecalciferol  1,000 Units Oral Daily   citalopram  20 mg Oral Daily    feeding supplement  237 mL Oral BID BM   ferrous sulfate  325 mg Oral Q breakfast   levETIRAcetam  500 mg Oral BID   pantoprazole  20 mg Oral Daily   polyethylene glycol  17 g Oral Daily   sodium chloride flush  3 mL Intravenous Q12H   Infusions:   cefTRIAXone (ROCEPHIN)  IV 1 g (11/14/22 1942)   heparin 1,150 Units/hr (11/14/22 1945)    Assessment: Pharmacy consulted to transition from Heparin drip to Apixaban for VTE treatment. 72 y.o. female with medical history significant of remote CVA, presented to the ED due to weakness. 7 PM she reports having a new right thigh pain in the lateral thigh in the middle between the knee and the hip. No DOAC PTA.    [place] of leg:  1) Occlusive, expansile thrombus within 1 of the 2 paired posterior tibial veins within the infrapopliteal left lower extremity. This does not extend into the left popliteal vein. 2. No evidence of deep venous thrombosis within the right lower extremity.  Goal of Therapy:  Monitor platelets by anticoagulation protocol: Yes   Plan:  Will order Apixaban 10mg  po BID x 7 days followed by apixaban 5 mg po BID for treatment of DVT/PE. F/u CBC per protocol  Per Uptodate: Apixaban Deep vein thrombosis and/or pulmonary embolism treatment: No dosage adjustment is recommended by the manufacturer for any degree of reduced kidney function.  Bari Mantis PharmD Clinical Pharmacist 11/15/2022

## 2022-11-15 NOTE — TOC Initial Note (Signed)
Transition of Care [facility]) - Initial/Assessment Note    Patient Details  Name: Leslie Houston MRN: [id number] Date of Birth: 04-27-1951  Transition of Care [facility]) CM/SW Contact:    Allena Katz, LCSW Phone Number: 11/15/2022, 10:31 AM  Clinical Narrative:   CSW spoke with pt to complete HRA. Pt currently lives alone and uses a RW at home to ambulate. She reports no needs. She states that there are no other DME needs at this time. She has had [facility] in the past and would like to use them again. Pt reports daughter drives her to her PCP appts with Francesca Oman and reports no other home needs.               Message sent to [facility] with [facility] to see if able to accept for [facility].        Patient Goals and CMS Choice            Expected Discharge Plan and Services                                              Prior Living Arrangements/Services                       Activities of Daily Living Home Assistive Devices/Equipment: Cane (specify quad or straight), Eyeglasses, Walker (specify type) ADL Screening (condition at time of admission) Patient's cognitive ability adequate to safely complete daily activities?: Yes Is the patient deaf or have difficulty hearing?: Yes Does the patient have difficulty seeing, even when wearing glasses/contacts?: No Does the patient have difficulty concentrating, remembering, or making decisions?: No Patient able to express need for assistance with ADLs?: Yes Does the patient have difficulty dressing or bathing?: No Independently performs ADLs?: Yes (appropriate for developmental age) Does the patient have difficulty walking or climbing stairs?: Yes Weakness of Legs: Both Weakness of Arms/Hands: Both  Permission Sought/Granted                  Emotional Assessment              Admission diagnosis:  Lower urinary tract infectious disease [N39.0] Thigh pain [M79.659] Right leg weakness [R29.898] Acute  nonintractable headache, unspecified headache type [R51.9] Patient Active Problem List   Diagnosis Date Noted   DVT (deep venous thrombosis) (HCC) 11/14/2022   Thigh pain 11/13/2022   Bilateral lower extremity edema 05/03/2022   Recurrent UTI 04/29/2022   Seizure disorder (HCC) 04/29/2022   GERD without esophagitis 04/29/2022   Hypernatremia 03/16/2022   Small bowel obstruction (HCC) 03/14/2022   Cerebrovascular accident (CVA) (HCC) 02/15/2022   Gait abnormality 02/15/2022   Urinary retention 02/15/2022   OSA (obstructive sleep apnea) 02/15/2022   History of CVA (cerebrovascular accident) 08/24/2021   Moderate episode of recurrent major depressive disorder (HCC) 06/28/2021   Statin myopathy 03/09/2020   Lymphedema 03/09/2020   Partial symptomatic epilepsy with complex partial seizures, not intractable, without status epilepticus (HCC) 02/18/2020   Bad dreams 02/18/2020   Chronic post-traumatic stress disorder (PTSD) 02/18/2020   Snoring 02/18/2020   Coronary artery disease 02/18/2020   Stroke (HCC)    Debility 07/07/2019   Hyperkalemia 07/03/2019   AKI (acute kidney injury) (HCC) 07/02/2019   Anemia of chronic disease 07/02/2019   Bradycardia 07/02/2019   Acute CHF (congestive heart failure) (HCC) 07/02/2019   Mild mental  handicap 01/22/2019   History of diabetes mellitus 01/22/2019   Osteopenia 01/24/2018   Anxiety 12/03/2017   Neutropenia (HCC) 12/31/2015   Seizures (HCC)    Carotid stenosis    PVC's (premature ventricular contractions)    CKD (chronic kidney disease) stage 4, GFR 15-29 ml/min (HCC) 08/19/2014   Morbid obesity (HCC) 03/04/2006   Essential hypertension 03/04/2006   GERD 03/04/2006   History of gastric ulcer 03/04/2006   PCP:  [name], DO Pharmacy:   [facility] - [place], [place] - [address] [place] [address] Phone: [phone] Fax: [phone]     Social Determinants of Health (SDOH) Social  History: SDOH Screenings   Food Insecurity: No Food Insecurity (11/14/2022)  Recent Concern: Food Insecurity - Food Insecurity Present (10/12/2022)  Housing: Low Risk  (11/14/2022)  Transportation Needs: No Transportation Needs (11/14/2022)  Utilities: Not At Risk (11/14/2022)  Alcohol Screen: Low Risk  (10/12/2022)  Depression (PHQ2-9): Low Risk  (10/12/2022)  Financial Resource Strain: Low Risk  (10/12/2022)  Physical Activity: Insufficiently Active (10/12/2022)  Social Connections: Socially Isolated (10/12/2022)  Stress: No Stress Concern Present (10/10/2021)  Tobacco Use: Medium Risk (11/13/2022)   SDOH Interventions:     Readmission Risk Interventions    11/15/2022   10:31 AM 03/17/2022   11:49 AM  Readmission Risk Prevention Plan  Transportation Screening Complete Complete  PCP or Specialist Appt within 3-5 Days  Complete  HRI or Home Care Consult  Complete  Social Work Consult for Recovery Care Planning/Counseling  Complete  Palliative Care Screening  Not Applicable  Medication Review Oceanographer) Complete Complete  PCP or Specialist appointment within 3-5 days of discharge Complete   HRI or Home Care Consult Complete   SW Recovery Care/Counseling Consult Complete

## 2022-11-15 NOTE — Plan of Care (Signed)
  Problem: Health Behavior/Discharge Planning: Goal: Ability to manage health-related needs will improve Outcome: Progressing   Problem: Clinical Measurements: Goal: Ability to maintain clinical measurements within normal limits will improve Outcome: Progressing   Problem: Activity: Goal: Risk for activity intolerance will decrease Outcome: Not Progressing   Problem: Pain Managment: Goal: General experience of comfort will improve Outcome: Progressing

## 2022-11-15 NOTE — Consult Note (Signed)
ANTICOAGULATION CONSULT NOTE   Pharmacy Consult for Heparin  Indication: DVT  Allergies  Allergen Reactions   Bactrim [Sulfamethoxazole-Trimethoprim] Other (See Comments)    Hematuria, dysuria   Latex Rash   Penicillin G Itching   Atorvastatin Other (See Comments)    Myalgias    Other Hives    Adhesive tape   Amlodipine Swelling    Leg swelling   Doxycycline     Blood in urine    Patient Measurements: Height: 5\' 1"  (154.9 cm) Weight: 85.3 kg (188 lb) IBW/kg (Calculated) : 47.8 Heparin Dosing Weight: 67.4 kg  Vital Signs: Temp: 97.7 F (36.5 C) (07/18 0459) Temp Source: Oral (07/18 0459) BP: 150/54 (07/18 0459) Pulse Rate: 50 (07/18 0459)  Labs: Recent Labs    11/13/22 1507 11/13/22 1950 11/14/22 0748 11/14/22 1122 11/14/22 2138 11/15/22 0422 11/15/22 0607  HGB 12.8  --  11.7*  --   --  11.6*  --   HCT 38.0  --  36.0  --   --  34.7*  --   PLT 154  --  157  --   --  173  --   APTT  --   --  92*  --   --   --   --   LABPROT  --   --  16.5*  --   --   --   --   INR  --   --  1.3*  --   --   --   --   HEPARINUNFRC  --   --   --  0.25* 0.47  --  0.59  CREATININE 2.67*  --  2.43*  --   --  2.66*  --   CKTOTAL  --   --  36*  --   --   --   --   TROPONINIHS  --  23* 15  --   --   --   --     Estimated Creatinine Clearance: 18.7 mL/min (A) (by C-G formula based on SCr of 2.66 mg/dL (H)).   Medical History: Past Medical History:  Diagnosis Date   (HFpEF) heart failure with preserved ejection fraction (HCC)    a. Pt reports in 2000 she had CHF, details unclear, outside hospital; b. 06/2019 Echo: EF 50-55%, no rwma, Gr2 DD,  mildly dil LA, mild MR.   Acute left PCA stroke (HCC) 06/22/2014   Anxiety    a.) on BZO (alprazolam) PRN   CAD (coronary artery disease)    a. Pt reports in 2000 she had a stent to a coronary artery, details unclear, outside hospital.   Carotid stenosis    a. CT angio head 07/2014 - 50-60% stenoses of prox ICA bilaterally.   CKD (chronic  kidney disease), stage IV (HCC)    Depression    Diabetes mellitus (HCC)    Denies.   Edentulous    No upper teeth   Family hx of colon cancer 06/13/2018   Father diagnosed in his 73's   Gout    History of cataract extraction, left 03/21/2021   HOH (hard of hearing)    usually needs to read lips   Hyperlipidemia    Hypertension    Intellectual disability    a.) born premature, limited formal education, has never driven   Myalgia due to statin    Orthostatic hypotension    OSA (obstructive sleep apnea)    a.) not on nocturnal PAP therapy; old/nonfuntioning DME - referred for repeat PSG 01/2022  by neurology   Osteopenia 01/24/2018   DEXA Sept 2019   PONV (postoperative nausea and vomiting)    PTSD (post-traumatic stress disorder)        PUD (peptic ulcer disease)    PVC's (premature ventricular contractions)    Right sided weakness 05/2014   a.) s/p CVA   Seizures (HCC)    Sinus bradycardia    Syncope    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)     Medications:  Medications Prior to Admission  Medication Sig Dispense Refill Last Dose   Alirocumab (PRALUENT) 150 MG/ML SOAJ Inject 1 mL (150 mg total) into the skin every 14 (fourteen) days. 2 mL 2 Past Month   aluminum-magnesium hydroxide-simethicone (MAALOX) 200-200-20 MG/5ML SUSP Take 30 mLs by mouth 4 (four) times daily -  before meals and at bedtime. 355 mL 0 unk at unk   benzonatate (TESSALON) 100 MG capsule Take 1 capsule (100 mg total) by mouth 2 (two) times daily as needed for cough. 20 capsule 0 unk at un   Cholecalciferol (VITAMIN D3) 25 MCG (1000 UT) CAPS Take 1 capsule by mouth daily.   11/13/2022   citalopram (CELEXA) 20 MG tablet TAKE 1 TABLET BY MOUTH ONCE A DAY 90 tablet 0 11/13/2022   feeding supplement (ENSURE ENLIVE / ENSURE PLUS) LIQD Take 237 mLs by mouth 2 (two) times daily between meals. 237 mL 12 Past Week   ferrous sulfate 325 (65 FE) MG tablet Take 325 mg by mouth daily with breakfast.    11/13/2022   fluticasone (FLONASE) 50 MCG/ACT nasal spray Place 2 sprays into both nostrils daily as needed for allergies (allergies).    unk at unk   guaiFENesin (MUCINEX) 600 MG 12 hr tablet Take 1 tablet (600 mg total) by mouth daily. 30 tablet 0 unk at unk   hydrALAZINE (APRESOLINE) 10 MG tablet Take 10 mg by mouth daily.   11/13/2022   levETIRAcetam (KEPPRA) 1000 MG tablet Take 1 tablet (1,000 mg total) by mouth 2 (two) times daily. 180 tablet 3 11/13/2022   loratadine (CLARITIN) 10 MG tablet TAKE 1 TABLET BY MOUTH ONCE A DAY AS NEEDED FOR ALLERGIES 30 tablet 3 unk at unk   metoCLOPramide (REGLAN) 10 MG tablet Take 1 tablet (10 mg total) by mouth every 6 (six) hours as needed. 30 tablet 0 Past Week   Multiple Vitamin (MULTIVITAMIN WITH MINERALS) TABS tablet Take 1 tablet by mouth daily. 30 tablet 1 11/13/2022   nystatin (MYCOSTATIN/NYSTOP) powder Apply 1 Application topically 3 (three) times daily. 60 g 1 Past Week   ondansetron (ZOFRAN-ODT) 4 MG disintegrating tablet Take 1 tablet (4 mg total) by mouth every 8 (eight) hours as needed. 20 tablet 0 unk at unk   pantoprazole (PROTONIX) 20 MG tablet TAKE 1 TABLET BY MOUTH ONCE A DAY 90 tablet 0 11/12/2022   polyethylene glycol (MIRALAX / GLYCOLAX) 17 g packet Take 17 g by mouth daily. 14 each 0 Past Week   torsemide (DEMADEX) 20 MG tablet Take 1 tablet (20 mg total) by mouth daily. 90 tablet 3 11/13/2022   traMADol (ULTRAM) 50 MG tablet Take 1 tablet (50 mg total) by mouth every 12 (twelve) hours as needed. 15 tablet 0 unk at unk   tamsulosin (FLOMAX) 0.4 MG CAPS capsule Take 0.4 mg by mouth daily. (Patient not taking: Reported on 11/13/2022)   Not Taking   Scheduled:   acetaminophen  650 mg Oral Q4H   cholecalciferol  1,000 Units Oral Daily  citalopram  20 mg Oral Daily   feeding supplement  237 mL Oral BID BM   ferrous sulfate  325 mg Oral Q breakfast   levETIRAcetam  1,000 mg Oral BID   pantoprazole  20 mg Oral Daily   polyethylene glycol  17  g Oral Daily   sodium chloride flush  3 mL Intravenous Q12H   torsemide  20 mg Oral Daily   Infusions:   cefTRIAXone (ROCEPHIN)  IV 1 g (11/14/22 1942)   heparin 1,150 Units/hr (11/14/22 1945)   PRN: fluticasone, polyethylene glycol, traMADol Anti-infectives (From admission, onward)    Start     Dose/Rate Route Frequency Ordered Stop   11/14/22 2000  cefTRIAXone (ROCEPHIN) 1 g in sodium chloride 0.9 % 100 mL IVPB        1 g 200 mL/hr over 30 Minutes Intravenous Every 24 hours 11/13/22 2126 11/20/22 1959   11/13/22 2015  cefTRIAXone (ROCEPHIN) 1 g in sodium chloride 0.9 % 100 mL IVPB        1 g 200 mL/hr over 30 Minutes Intravenous  Once 11/13/22 2006 11/13/22 2146       Assessment: Pharmacy consulted to start heparin for VTE treatment. 72 y.o. female with medical history significant of remote CVA, presented to the ED due to weakness. 7 PM she reports having a new right thigh pain in the lateral thigh in the middle between the knee and the hip. No DOAC PTA.   [place] of leg:  1) Occlusive, expansile thrombus within 1 of the 2 paired posterior tibial veins within the infrapopliteal left lower extremity. This does not extend into the left popliteal vein. 2. No evidence of deep venous thrombosis within the right lower extremity.  0717 2138 HL 0.47.  0717 0607 HL 0.59   Goal of Therapy:  Heparin level 0.3-0.7 units/ml Monitor platelets by anticoagulation protocol: Yes   Plan:  Heparin level is therapeutic. Will continue heparin infusion at 1150 units/hr. Recheck heparin level and CBC with AM labs. Recommend AM Rph get a co-pay check for apixaban. Would avoid using rivaroxaban due to CrCL < 30, but can still be an option since CrCl > 15 if its covered by insurance and apixaban is not.   Ronnald Ramp, PharmD, BCPS 11/15/2022,6:43 AM

## 2022-11-15 NOTE — Progress Notes (Addendum)
Physical Therapy Treatment Patient Details Name: Leslie Houston MRN: [id number] DOB: 04/19/1951 Today's Date: 11/15/2022   History of Present Illness 72 year old female with remote CVA, complicated by right-sided weakness, CKD stage 4, chronic suprapubic catheter, CAD, chronic diastolic CHF, OSA presented to ED with generalized weakness.  Patient was in her usual state of health until 7/15 when she had the suprapubic cath catheter changed due to urinary retention, had tolerated the procedure well.  Patient then started having new right thigh pain in the evening.  Next morning, she noted the pain was worse when she tried to ambulate.    PT Comments  Pt pleasant and motivated for therapy, lying in bed upon arrival. Pt able to come to sitting position mod I for increased time. Vitals upon sitting EOB as follows: HR 56 bpm, O2 95%. Pt able to stand from bed with SPC and min guard for safety. She ambulated ~400 feet with SPC and min guard; good, consistent cadence throughout and no LOB. Pt's vitals mid-ambulation as follows: HR 63 bpm, O2 93%. Following a 1-2 minute seated rest break post-ambulation, pt able to perform a few balance activities in front of the recliner. She had no overt losses of balance, just occasional postural sway which she was able to self-arrest each time. Pt returned to recliner at end of session with all needs in reach. Pt will benefit from continued PT services upon discharge to safely address deficits listed in patient problem list for decreased caregiver assistance and eventual return to PLOF.     Assistance Recommended at Discharge Intermittent Supervision/Assistance  If plan is discharge home, recommend the following:  Can travel by private vehicle    A little help with bathing/dressing/bathroom;Assistance with cooking/housework;Assist for transportation;A little help with walking and/or transfers      Equipment Recommendations  None recommended by PT     Recommendations for Other Services       Precautions / Restrictions Precautions Precautions: Fall Restrictions Weight Bearing Restrictions: No     Mobility  Bed Mobility Overal bed mobility: Needs Assistance Bed Mobility: Supine to Sit     Supine to sit: Modified independent (Device/Increase time)     General bed mobility comments: Mod I for sup-sit, increased time but no physical assistance or cueing needed.    Transfers Overall transfer level: Needs assistance Equipment used: Straight cane Transfers: Sit to/from Stand             General transfer comment: Pt able to stand from bed with SPC in R hand and min guard for safety. Good concentric control with standing and good eccentric control sitting down to recliner.    Ambulation/Gait Ambulation/Gait assistance: Min guard Gait Distance (Feet): 400 Feet Assistive device: Straight cane Gait Pattern/deviations: Step-through pattern Gait velocity: WNL     General Gait Details: Pt able to ambulate ~400 feet with SPC in right hand and min guard for safety. No instances of LOB. Good, consistent cadence throughout.   Stairs             Wheelchair Mobility     Tilt Bed    Modified Rankin (Stroke Patients Only)       Balance Overall balance assessment: Needs assistance Sitting-balance support: Feet supported, No upper extremity supported Sitting balance-Leahy Scale: Normal     Standing balance support: Single extremity supported, During functional activity Standing balance-Leahy Scale: Normal Standing balance comment: Pt had no instances of LOB while ambulating with SPC. Pt able to perform moderate-level balance activities with no  UE support and no overt LOB.     Tandem Stance - Right Leg: 30 seconds with eyes closed Tandem Stance - Left Leg: 30 seconds with eyes closed Rhomberg - Eyes Opened: 30 seconds Rhomberg - Eyes Closed: 30 seconds                Cognition Arousal/Alertness:  Awake/alert Behavior During Therapy: WFL for tasks assessed/performed Overall Cognitive Status: Within Functional Limits for tasks assessed                                          Exercises Other Exercises Other Exercises: Standing balance activities: rhomberg, semi-tandem stances with eyes open/closed    General Comments        Pertinent Vitals/Pain Pain Assessment Pain Assessment: 0-10 Pain Score: 2  Pain Location: Across chest (no change in chest pain post-ambulation) Pain Intervention(s): Limited activity within patient's tolerance, Monitored during session, Repositioned    Home Living                          Prior Function            PT Goals (current goals can now be found in the care plan section) Progress towards PT goals: Progressing toward goals    Frequency    Min 1X/week      PT Plan Current plan remains appropriate    Co-evaluation              AM-PAC PT "6 Clicks" Mobility   Outcome Measure  Help needed turning from your back to your side while in a flat bed without using bedrails?: None Help needed moving from lying on your back to sitting on the side of a flat bed without using bedrails?: A Little Help needed moving to and from a bed to a chair (including a wheelchair)?: None Help needed standing up from a chair using your arms (e.g., wheelchair or bedside chair)?: None Help needed to walk in hospital room?: None Help needed climbing 3-5 steps with a railing? : A Little 6 Click Score: 22    End of Session Equipment Utilized During Treatment: Gait belt Activity Tolerance: Patient tolerated treatment well Patient left: with call bell/phone within reach;in chair;with chair alarm set Nurse Communication: Mobility status PT Visit Diagnosis: Muscle weakness (generalized) (M62.81);Difficulty in walking, not elsewhere classified (R26.2);Pain Pain - Right/Left: Right Pain - part of body: Hip     Time:  1610-9604 PT Time Calculation (min) (ACUTE ONLY): 25 min  Charges:                            Timothy Crutchfield, SPT 11/15/22, 4:13 PM This entire session was performed under direct supervision and direction of a licensed therapist/therapist assistant. I have personally read, edited and approve of the note as written.  Loran Senters, DPT

## 2022-11-16 ENCOUNTER — Inpatient Hospital Stay
Admit: 2022-11-16 | Discharge: 2022-11-16 | Disposition: A | Discharge status: Home / Self Care | Payer: Medicare Other | Attending: Internal Medicine | Admitting: Internal Medicine

## 2022-11-16 ENCOUNTER — Encounter: Payer: Self-pay | Admitting: Internal Medicine

## 2022-11-16 ENCOUNTER — Inpatient Hospital Stay: Payer: Medicare Other

## 2022-11-16 DIAGNOSIS — M79651 Pain in right thigh: Secondary | ICD-10-CM | POA: Diagnosis not present

## 2022-11-16 DIAGNOSIS — R519 Headache, unspecified: Secondary | ICD-10-CM | POA: Diagnosis not present

## 2022-11-16 DIAGNOSIS — E1122 Type 2 diabetes mellitus with diabetic chronic kidney disease: Secondary | ICD-10-CM | POA: Diagnosis not present

## 2022-11-16 DIAGNOSIS — R079 Chest pain, unspecified: Secondary | ICD-10-CM | POA: Diagnosis not present

## 2022-11-16 DIAGNOSIS — I517 Cardiomegaly: Secondary | ICD-10-CM | POA: Diagnosis not present

## 2022-11-16 DIAGNOSIS — R0600 Dyspnea, unspecified: Secondary | ICD-10-CM | POA: Diagnosis not present

## 2022-11-16 DIAGNOSIS — I82442 Acute embolism and thrombosis of left tibial vein: Secondary | ICD-10-CM | POA: Diagnosis not present

## 2022-11-16 DIAGNOSIS — R29898 Other symptoms and signs involving the musculoskeletal system: Secondary | ICD-10-CM | POA: Diagnosis not present

## 2022-11-16 DIAGNOSIS — I130 Hypertensive heart and chronic kidney disease with heart failure and stage 1 through stage 4 chronic kidney disease, or unspecified chronic kidney disease: Secondary | ICD-10-CM | POA: Diagnosis not present

## 2022-11-16 DIAGNOSIS — I5032 Chronic diastolic (congestive) heart failure: Secondary | ICD-10-CM | POA: Diagnosis not present

## 2022-11-16 DIAGNOSIS — N390 Urinary tract infection, site not specified: Secondary | ICD-10-CM | POA: Diagnosis not present

## 2022-11-16 DIAGNOSIS — R531 Weakness: Secondary | ICD-10-CM | POA: Diagnosis not present

## 2022-11-16 DIAGNOSIS — N184 Chronic kidney disease, stage 4 (severe): Secondary | ICD-10-CM | POA: Diagnosis not present

## 2022-11-16 LAB — BASIC METABOLIC PANEL
Anion gap: 9 (ref 5–15)
BUN: 37 mg/dL — ABNORMAL HIGH (ref 8–23)
CO2: 21 mmol/L — ABNORMAL LOW (ref 22–32)
Calcium: 8.9 mg/dL (ref 8.9–10.3)
Chloride: 103 mmol/L (ref 98–111)
Creatinine, Ser: 2.53 mg/dL — ABNORMAL HIGH (ref 0.44–1.00)
GFR, Estimated: 20 mL/min — ABNORMAL LOW (ref >60)
Glucose, Bld: 81 mg/dL (ref 70–99)
Potassium: 3.7 mmol/L (ref 3.5–5.1)
Sodium: 133 mmol/L — ABNORMAL LOW (ref 135–145)

## 2022-11-16 LAB — CBC
HCT: 34.3 % — ABNORMAL LOW (ref 36.0–46.0)
Hemoglobin: 11.7 g/dL — ABNORMAL LOW (ref 12.0–15.0)
MCH: 33 pg (ref 26.0–34.0)
MCHC: 34.1 g/dL (ref 30.0–36.0)
MCV: 96.6 fL (ref 80.0–100.0)
Platelets: 191 10*3/uL (ref 150–400)
RBC: 3.55 MIL/uL — ABNORMAL LOW (ref 3.87–5.11)
RDW: 11.8 % (ref 11.5–15.5)
WBC: 2.5 10*3/uL — ABNORMAL LOW (ref 4.0–10.5)
nRBC: 0 % (ref 0.0–0.2)

## 2022-11-16 MED ORDER — DOCUSATE SODIUM 100 MG PO CAPS
100.0000 mg | ORAL_CAPSULE | Freq: Two times a day (BID) | ORAL | Status: DC
  Administered 2022-11-16 – 2022-11-17 (×3): 100 mg via ORAL
  Filled 2022-11-16 (×2): qty 1

## 2022-11-16 MED ORDER — DOCUSATE SODIUM 100 MG PO CAPS
ORAL_CAPSULE | ORAL | Status: AC
  Filled 2022-11-16: qty 1

## 2022-11-16 MED ORDER — TECHNETIUM TO 99M ALBUMIN AGGREGATED
4.0000 | Freq: Once | INTRAVENOUS | Status: AC | PRN
  Administered 2022-11-16: 4.03 via INTRAVENOUS

## 2022-11-16 MED ORDER — FLEET ENEMA 7-19 GM/118ML RE ENEM
1.0000 | ENEMA | Freq: Once | RECTAL | Status: AC
  Administered 2022-11-16: 1 via RECTAL

## 2022-11-16 NOTE — Progress Notes (Addendum)
Occupational Therapy Treatment Patient Details Name: Leslie Houston MRN: [id number] DOB: 05-18-1950 Today's Date: 11/16/2022   History of present illness 72 year old female with remote CVA, complicated by right-sided weakness, CKD stage 4, chronic suprapubic catheter, CAD, chronic diastolic CHF, OSA presented to ED with generalized weakness.  Patient was in her usual state of health until 7/15 when she had the suprapubic cath catheter changed due to urinary retention, had tolerated the procedure well.  Patient then started having new right thigh pain in the evening.  Next morning, she noted the pain was worse when she tried to ambulate.   OT comments  Chart reviewed ,of note pt is being worked up for PE, on anticoagulants, nurse ok'd tx session. Tx session targeted further assessing med management skills via Pill Box test as pt reports she performs with MOD I at home. Please refer to further details below. After discharge, if pt is on new meds would recommend assist with med management and further assessment in a familiar environments. Vss throughout Pt is making progress towards goals, OT will follow acutely.    Recommendations for follow up therapy are one component of a multi-disciplinary discharge planning process, led by the attending physician.  Recommendations may be updated based on patient status, additional functional criteria and insurance authorization.    Assistance Recommended at Discharge Intermittent Supervision/Assistance  Patient can return home with the following  A little help with walking and/or transfers;A little help with bathing/dressing/bathroom;Direct supervision/assist for medications management   Equipment Recommendations  None recommended by OT    Recommendations for Other Services      Precautions / Restrictions Precautions Precautions: Fall Restrictions Weight Bearing Restrictions: No       Mobility Bed Mobility Overal bed mobility: Needs  Assistance Bed Mobility: Supine to Sit     Supine to sit: Modified independent (Device/Increase time)          Transfers                         Balance Overall balance assessment: Needs assistance Sitting-balance support: Feet supported, No upper extremity supported Sitting balance-Leahy Scale: Normal                                     ADL either performed or assessed with clinical judgement   ADL Overall ADL's : Needs assistance/impaired     Grooming: Wash/dry face;Set up;Oral care                   Toilet Transfer: Supervision/safety;Rolling walker (2 wheels) Toilet Transfer Details (indicate cue type and reason): simulated           General ADL Comments: pt currently being worked up for PE, is on anticoagulation but mobiltiy limited until further work up is complete    Extremity/Trunk Psychiatrist Arousal/Alertness: Awake/alert Behavior During Therapy: WFL for tasks assessed/performed Overall Cognitive Status: No family/caregiver present to determine baseline cognitive functioning Area of Impairment: Problem solving, Awareness                               General Comments: pt participated in pillbox test, unable to complete test in  time limit. Pt required therapist to read directions to her and was approx 50% accuracy when placing pills in to therapist prompted areas. Pt does describe in detail how she manages her meds at home, would benefit from further assessment and potential assist after discharge        Exercises      Shoulder Instructions       General Comments vss throughout    Pertinent Vitals/ Pain       Pain Assessment Pain Assessment: No/denies pain  Home Living                                          Prior Functioning/Environment              Frequency  Min 1X/week        Progress  Toward Goals  OT Goals(current goals can now be found in the care plan section)  Progress towards OT goals: Progressing toward goals     Plan Discharge plan remains appropriate    Co-evaluation                 AM-PAC OT "6 Clicks" Daily Activity     Outcome Measure   Help from another person eating meals?: None Help from another person taking care of personal grooming?: None Help from another person toileting, which includes using toliet, bedpan, or urinal?: A Little Help from another person bathing (including washing, rinsing, drying)?: A Little Help from another person to put on and taking off regular upper body clothing?: None Help from another person to put on and taking off regular lower body clothing?: A Little 6 Click Score: 21    End of Session Equipment Utilized During Treatment: Rolling walker (2 wheels)  OT Visit Diagnosis: Other abnormalities of gait and mobility (R26.89)   Activity Tolerance Patient tolerated treatment well   Patient Left in bed;with call bell/phone within reach;with bed alarm set   Nurse Communication Mobility status        Time: 1353-1415 OT Time Calculation (min): 22 min  Charges: OT General Charges $OT Visit: 1 Visit OT Treatments $Self Care/Home Management : 8-22 mins  {Lelar Farewell, OTD OTR/L  11/16/22, 3:54 PM

## 2022-11-16 NOTE — Plan of Care (Signed)

## 2022-11-16 NOTE — TOC Progression Note (Addendum)
Transition of Care [facility]) - Progression Note    Patient Details  Name: Leslie Houston MRN: [id number] Date of Birth: 05/03/1950  [facility] CM/SW Contact  Liliana Cline, LCSW Phone Number: 11/16/2022, 9:27 AM  Clinical Narrative:    Notified Pharmacy of consult for "Need insurance copays for xarelto vs eliquis" Per Pharmacist, this has already been addressed yesterday.       Expected Discharge Plan and Services                                               Social Determinants of Health (SDOH) Interventions SDOH Screenings   Food Insecurity: No Food Insecurity (11/14/2022)  Recent Concern: Food Insecurity - Food Insecurity Present (10/12/2022)  Housing: Low Risk  (11/14/2022)  Transportation Needs: No Transportation Needs (11/14/2022)  Utilities: Not At Risk (11/14/2022)  Alcohol Screen: Low Risk  (10/12/2022)  Depression (PHQ2-9): Low Risk  (10/12/2022)  Financial Resource Strain: Low Risk  (10/12/2022)  Physical Activity: Insufficiently Active (10/12/2022)  Social Connections: Socially Isolated (10/12/2022)  Stress: No Stress Concern Present (10/10/2021)  Tobacco Use: Medium Risk (11/13/2022)    Readmission Risk Interventions    11/15/2022   10:31 AM 03/17/2022   11:49 AM  Readmission Risk Prevention Plan  Transportation Screening Complete Complete  PCP or Specialist Appt within 3-5 Days  Complete  HRI or Home Care Consult  Complete  Social Work Consult for Recovery Care Planning/Counseling  Complete  Palliative Care Screening  Not Applicable  Medication Review Oceanographer) Complete Complete  PCP or Specialist appointment within 3-5 days of discharge Complete   HRI or Home Care Consult Complete   SW Recovery Care/Counseling Consult Complete

## 2022-11-16 NOTE — Progress Notes (Signed)
Patient received via bed in stable condition.

## 2022-11-16 NOTE — Care Management Important Message (Signed)
Important Message  Patient Details  Name: Leslie Houston MRN: [id number] Date of Birth: July 03, 1950   Medicare Important Message Given:  N/A - LOS <3 / Initial given by admissions     Olegario Messier A Maeve Debord 11/16/2022, 9:09 AM

## 2022-11-16 NOTE — Progress Notes (Signed)
Patient sent for [facility] via bed in stable condition.

## 2022-11-16 NOTE — Progress Notes (Signed)
PT Cancellation Note  Patient Details Name: Leslie Houston MRN: [id number] DOB: 07-02-1950   Cancelled Treatment:    Reason Eval/Treat Not Completed: Medical issues which prohibited therapy: Per MD PT to be held at this time secondary to rule out for PE ordered.  Will attempt to see pt at a future date/time as medically appropriate.    Ovidio Hanger PT, DPT 11/16/22, 3:44 PM

## 2022-11-16 NOTE — Progress Notes (Signed)
[facility]                                                                              Leslie Houston, is a 72 y.o. female, DOB - [birth year], WGN:[id number] Admit date - 11/13/2022    Outpatient Primary MD for the patient is [name], DO  LOS - 2  days  Chief Complaint  Patient presents with   Weakness       Brief summary   Patient is a 72 year old female with remote CVA, complicated by right-sided weakness, CKD stage 4, chronic suprapubic catheter, CAD, chronic diastolic CHF, OSA presented to ED with generalized weakness.  Patient was in her usual state of health until 7/15 when she had the suprapubic cath catheter changed due to urinary retention, had tolerated the procedure well.  Patient then started having new right thigh pain in the evening.  Next morning, she noted the pain was worse when she tried to ambulate.  No fall or any trauma, no back pain.  Also reported headache, nausea, vomiting, diarrhea, abdominal pain/suprapubic.  No focal weakness, vision changes. in ED, CK 36, troponins negative  CT abdomen showed no acute findings, suprapubic catheter decompresses urinary bladder.  Chronic bilateral renal parenchymal atrophy with mild distention of the renal collecting systems, no urolithiasis. CT head showed no acute intracranial abnormality, secretions in the right sphenoid sinus, correlate with acute sinusitis.  Venous Doppler lower extremity showed occlusive, expansile thrombus in the posterior tibial vein within the infrapopliteal left lower extremity, does not extend into the left popliteal vein.  No evidence of DVT in the right lower extremity  Assessment & Plan    Principal Problem:   Right Thigh pain -Unclear etiology, CK normal.  Venous Doppler with no evidence of DVT in the right lower extremity -improved  -PT OT evaluation -> HHPT  -CT right hip and femur negative for any abscess or infectious etiology, no fracture or  dislocation -cont PT    Active Problems:  Acute LLE DVT  -transitioned to eliquis   Atypical chest pain Hx of CAD  -Complaining of chest pain, appears musculoskeletal, state worse with movements.  -CK20 9, troponin 13.  EKG showed no acute ST-T wave changes suggestive of ischemia. - given DVT, will r/o acute PE, follow VQ scan  - d/w patient's daughter, requested Echo     Recurrent UTI, CAUTI -Continue IV Rocephin -Urine culture showed multiple species     Essential hypertension -BP currently stable     CKD (chronic kidney disease) stage 4, GFR 15-29 ml/min (HCC) -Baseline creatinine 2.4-3.5 -Currently at baseline, creatinine 2.4 on 7/17-> 2.6 today -On torsemide, will hold today, Cr 2.5, will resume in am     History of CVA (cerebrovascular accident) -CT head negative, no focal weakness    Urinary retention, history of chronic suprapubic catheter -Recently treated for UTI patient 11/12/18    OSA (obstructive sleep apnea)  Constipation - continue Miralax, added colace - fleet enema today      Seizure disorder (HCC) -Continue Keppra    GERD without esophagitis -Continue PPI    Morbid obesity (HCC) Estimated body mass index is  35.52 kg/m as calculated from the following:   Height as of this encounter: 5\' 1"  (1.549 m).   Weight as of this encounter: 85.3 kg.  Code Status: Full code DVT Prophylaxis:  SCDs Start: 11/13/22 2138 apixaban (ELIQUIS) tablet 10 mg  apixaban (ELIQUIS) tablet 5 mg   Level of Care: Level of care: Med-Surg Family Communication: Updated patient's daughter on phone  Disposition Plan:      Remains inpatient appropriate: Workup in progress   Procedures:  Venous Doppler lower extremity  Consultants:     Antimicrobials:   Anti-infectives (From admission, onward)    Start     Dose/Rate Route Frequency Ordered Stop   11/14/22 2000  cefTRIAXone (ROCEPHIN) 1 g in sodium chloride 0.9 % 100 mL IVPB        1 g 200 mL/hr over 30  Minutes Intravenous Every 24 hours 11/13/22 2126 11/20/22 1959   11/13/22 2015  cefTRIAXone (ROCEPHIN) 1 g in sodium chloride 0.9 % 100 mL IVPB        1 g 200 mL/hr over 30 Minutes Intravenous  Once 11/13/22 2006 11/13/22 2146          Medications  acetaminophen  650 mg Oral Q4H   apixaban  10 mg Oral BID   Followed by   Melene Muller ON 11/22/2022] apixaban  5 mg Oral BID   cholecalciferol  1,000 Units Oral Daily   citalopram  20 mg Oral Daily   docusate sodium  100 mg Oral BID   feeding supplement  237 mL Oral BID BM   ferrous sulfate  325 mg Oral Q breakfast   levETIRAcetam  500 mg Oral BID   pantoprazole  20 mg Oral Daily   polyethylene glycol  17 g Oral Daily   sodium chloride flush  3 mL Intravenous Q12H   sodium phosphate  1 enema Rectal Once      Subjective:   Leslie Houston was seen and examined today.  Still has atypical chest pain, no dizziness, palpitations, dizziness. No right thigh pain. No shortness of breath, fever chills nausea or vomiting.    Objective:   Vitals:   11/15/22 1636 11/15/22 2007 11/16/22 0500 11/16/22 0907  BP: (!) 141/83 (!) 160/72 (!) 160/64 (!) 148/61  Pulse: (!) 50 (!) 46 (!) 52 (!) 50  Resp: 17 20 20 18   Temp: 97.6 F (36.4 C) (!) 97.3 F (36.3 C) 98 F (36.7 C) 98 F (36.7 C)  TempSrc:   Oral Oral  SpO2: 99% 97% 98% 99%  Weight:      Height:        Intake/Output Summary (Last 24 hours) at 11/16/2022 1040 Last data filed at 11/16/2022 0909 Gross per 24 hour  Intake --  Output 1852 ml  Net -1852 ml     Wt Readings from Last 3 Encounters:  11/13/22 85.3 kg  11/12/22 85.3 kg  10/12/22 82.6 kg    Physical Exam General: Alert and oriented x 3, NAD Cardiovascular: S1 S2 clear, RRR.  Respiratory: CTAB, no wheezing Gastrointestinal: Soft, nontender, nondistended, NBS Ext: no pedal edema bilaterally Neuro: no new deficits Psych: Normal affect    Data Reviewed:  I have personally reviewed following labs    CBC Lab  Results  Component Value Date   WBC 2.5 (L) 11/16/2022   RBC 3.55 (L) 11/16/2022   HGB 11.7 (L) 11/16/2022   HCT 34.3 (L) 11/16/2022   MCV 96.6 11/16/2022   MCH 33.0 11/16/2022   PLT 191 11/16/2022  MCHC 34.1 11/16/2022   RDW 11.8 11/16/2022   LYMPHSABS 1,294 05/03/2022   MONOABS 0.4 03/14/2022   EOSABS 90 05/03/2022   BASOSABS 22 05/03/2022     Last metabolic panel Lab Results  Component Value Date   NA 133 (L) 11/16/2022   K 3.7 11/16/2022   CL 103 11/16/2022   CO2 21 (L) 11/16/2022   BUN 37 (H) 11/16/2022   CREATININE 2.53 (H) 11/16/2022   GLUCOSE 81 11/16/2022   GFRNONAA 20 (L) 11/16/2022   GFRAA 23 (L) 05/12/2020   CALCIUM 8.9 11/16/2022   PHOS 2.7 11/13/2022   PROT 7.5 11/13/2022   ALBUMIN 3.4 (L) 11/13/2022   BILITOT 0.9 11/13/2022   ALKPHOS 88 11/13/2022   AST 31 11/13/2022   ALT 23 11/13/2022   ANIONGAP 9 11/16/2022    CBG (last 3)  No results for input(s): "GLUCAP" in the last 72 hours.    Coagulation Profile: Recent Labs  Lab 11/14/22 0748  INR 1.3*     Radiology Studies: I have personally reviewed the imaging studies  CT HIP RIGHT WO CONTRAST  Result Date: 11/14/2022 CLINICAL DATA:  Right leg weakness. EXAM: CT OF THE RIGHT HIP AND FEMUR WITHOUT CONTRAST TECHNIQUE: Multidetector CT imaging of the right hip and femur was performed according to the standard protocol. Multiplanar CT image reconstructions were also generated. RADIATION DOSE REDUCTION: This exam was performed according to the departmental dose-optimization program which includes automated exposure control, adjustment of the mA and/or kV according to patient size and/or use of iterative reconstruction technique. COMPARISON:  CT abdomen pelvis from yesterday. FINDINGS: Bones/Joint/Cartilage No fracture or dislocation. Mild right hip and moderate right knee degenerative changes. Small right knee joint effusion. Ligaments Ligaments are suboptimally evaluated by CT. Muscles and Tendons  Grossly intact. Soft tissue No fluid collection or hematoma.  No soft tissue mass. IMPRESSION: 1. No acute osseous abnormality. 2. 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 Electronically Signed   By: Obie Dredge M.D.   On: 11/14/2022 13:42   CT FEMUR RIGHT WO CONTRAST  Result Date: 11/14/2022 CLINICAL DATA:  Right leg weakness. EXAM: CT OF THE RIGHT HIP AND FEMUR WITHOUT CONTRAST TECHNIQUE: Multidetector CT imaging of the right hip and femur was performed according to the standard protocol. Multiplanar CT image reconstructions were also generated. RADIATION DOSE REDUCTION: This exam was performed according to the departmental dose-optimization program which includes automated exposure control, adjustment of the mA and/or kV according to patient size and/or use of iterative reconstruction technique. COMPARISON:  CT abdomen pelvis from yesterday. FINDINGS: Bones/Joint/Cartilage No fracture or dislocation. Mild right hip and moderate right knee degenerative changes. Small right knee joint effusion. Ligaments Ligaments are suboptimally evaluated by CT. Muscles and Tendons Grossly intact. Soft tissue No fluid collection or hematoma.  No soft tissue mass. IMPRESSION: 1. No acute osseous abnormality. 2. 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 Electronically Signed   By: Obie Dredge M.D.   On: 11/14/2022 13:42       Tasean Mancha M.D. [facility] 11/16/2022, 10:40 AM  Available via Epic secure chat 7am-7pm After 7 pm, please refer to night coverage provider listed on amion.

## 2022-11-17 ENCOUNTER — Inpatient Hospital Stay: Admit: 2022-11-17 | Payer: Medicare Other

## 2022-11-17 DIAGNOSIS — N390 Urinary tract infection, site not specified: Secondary | ICD-10-CM | POA: Diagnosis not present

## 2022-11-17 DIAGNOSIS — I130 Hypertensive heart and chronic kidney disease with heart failure and stage 1 through stage 4 chronic kidney disease, or unspecified chronic kidney disease: Secondary | ICD-10-CM | POA: Diagnosis not present

## 2022-11-17 DIAGNOSIS — I5032 Chronic diastolic (congestive) heart failure: Secondary | ICD-10-CM | POA: Diagnosis not present

## 2022-11-17 DIAGNOSIS — K219 Gastro-esophageal reflux disease without esophagitis: Secondary | ICD-10-CM

## 2022-11-17 DIAGNOSIS — R519 Headache, unspecified: Secondary | ICD-10-CM | POA: Diagnosis not present

## 2022-11-17 DIAGNOSIS — E1122 Type 2 diabetes mellitus with diabetic chronic kidney disease: Secondary | ICD-10-CM | POA: Diagnosis not present

## 2022-11-17 DIAGNOSIS — R29898 Other symptoms and signs involving the musculoskeletal system: Secondary | ICD-10-CM | POA: Diagnosis not present

## 2022-11-17 DIAGNOSIS — N184 Chronic kidney disease, stage 4 (severe): Secondary | ICD-10-CM | POA: Diagnosis not present

## 2022-11-17 DIAGNOSIS — R531 Weakness: Secondary | ICD-10-CM | POA: Diagnosis not present

## 2022-11-17 DIAGNOSIS — I82442 Acute embolism and thrombosis of left tibial vein: Secondary | ICD-10-CM | POA: Diagnosis not present

## 2022-11-17 LAB — BASIC METABOLIC PANEL
Anion gap: 4 — ABNORMAL LOW (ref 5–15)
BUN: 41 mg/dL — ABNORMAL HIGH (ref 8–23)
CO2: 25 mmol/L (ref 22–32)
Calcium: 9 mg/dL (ref 8.9–10.3)
Chloride: 106 mmol/L (ref 98–111)
Creatinine, Ser: 2.42 mg/dL — ABNORMAL HIGH (ref 0.44–1.00)
GFR, Estimated: 21 mL/min — ABNORMAL LOW (ref >60)
Glucose, Bld: 81 mg/dL (ref 70–99)
Potassium: 3.9 mmol/L (ref 3.5–5.1)
Sodium: 135 mmol/L (ref 135–145)

## 2022-11-17 LAB — CBC
HCT: 37 % (ref 36.0–46.0)
Hemoglobin: 12.3 g/dL (ref 12.0–15.0)
MCH: 33.2 pg (ref 26.0–34.0)
MCHC: 33.2 g/dL (ref 30.0–36.0)
MCV: 99.7 fL (ref 80.0–100.0)
Platelets: 226 10*3/uL (ref 150–400)
RBC: 3.71 MIL/uL — ABNORMAL LOW (ref 3.87–5.11)
RDW: 11.8 % (ref 11.5–15.5)
WBC: 2.8 10*3/uL — ABNORMAL LOW (ref 4.0–10.5)
nRBC: 0 % (ref 0.0–0.2)

## 2022-11-17 MED ORDER — NITROGLYCERIN 0.4 MG SL SUBL: 0.4000 mg | SUBLINGUAL_TABLET | SUBLINGUAL | 12 refills | Status: AC | PRN

## 2022-11-17 MED ORDER — LEVETIRACETAM 500 MG PO TABS: 500.0000 mg | ORAL_TABLET | Freq: Two times a day (BID) | ORAL | 3 refills | Status: DC

## 2022-11-17 MED ORDER — ONDANSETRON 4 MG PO TBDP: 4.0000 mg | ORAL_TABLET | Freq: Three times a day (TID) | ORAL | 0 refills | Status: DC | PRN

## 2022-11-17 MED ORDER — APIXABAN 5 MG PO TABS: 5.0000 mg | ORAL_TABLET | Freq: Two times a day (BID) | ORAL | 4 refills | Status: DC

## 2022-11-17 MED ORDER — APIXABAN (ELIQUIS) VTE STARTER PACK (10MG AND 5MG): ORAL_TABLET | ORAL | 0 refills | Status: DC

## 2022-11-17 MED ORDER — PANTOPRAZOLE SODIUM 20 MG PO TBEC
20.0000 mg | DELAYED_RELEASE_TABLET | Freq: Every morning | ORAL | 0 refills | Status: DC
Start: 2022-11-17 — End: 2023-02-19

## 2022-11-17 NOTE — Plan of Care (Signed)

## 2022-11-17 NOTE — Plan of Care (Signed)
Patient is alert and oriented X4.  Patient is d/c with suprapubic catheter.Discharge teaching given to patient daughter.  Problem: Education: Goal: Knowledge of General Education information will improve Description: Including pain rating scale, medication(s)/side effects and non-pharmacologic comfort measures Outcome: Completed/Met   Problem: Health Behavior/Discharge Planning: Goal: Ability to manage health-related needs will improve Outcome: Completed/Met   Problem: Clinical Measurements: Goal: Ability to maintain clinical measurements within normal limits will improve Outcome: Completed/Met Goal: Will remain free from infection Outcome: Completed/Met Goal: Diagnostic test results will improve Outcome: Completed/Met Goal: Respiratory complications will improve Outcome: Completed/Met Goal: Cardiovascular complication will be avoided Outcome: Completed/Met   Problem: Activity: Goal: Risk for activity intolerance will decrease Outcome: Completed/Met   Problem: Nutrition: Goal: Adequate nutrition will be maintained Outcome: Completed/Met   Problem: Coping: Goal: Level of anxiety will decrease Outcome: Completed/Met   Problem: Elimination: Goal: Will not experience complications related to bowel motility Outcome: Completed/Met Goal: Will not experience complications related to urinary retention Outcome: Completed/Met   Problem: Pain Managment: Goal: General experience of comfort will improve Outcome: Completed/Met   Problem: Safety: Goal: Ability to remain free from injury will improve Outcome: Completed/Met   Problem: Skin Integrity: Goal: Risk for impaired skin integrity will decrease Outcome: Completed/Met

## 2022-11-17 NOTE — Discharge Summary (Addendum)
Physician Discharge Summary   Patient: Leslie Houston MRN: [id number] DOB: 1950-06-04  Admit date:     11/13/2022  Discharge date: 11/17/22  Discharge Physician: Thad Ranger, MD    PCP: [name], DO   Recommendations at discharge:   Continue Eliquis 10 mg twice daily for 7 days, then on 11/22/2022, eliquis will need to be decreased to 5 mg twice daily. Keppra dose decreased to 500 mg twice daily due to renal function  Discharge Diagnoses:   Acute LLE DVT (deep venous thrombosis) (HCC)   Recurrent UTI Right thigh pain   Morbid obesity (HCC)   Essential hypertension   CKD (chronic kidney disease) stage 4, GFR 15-29 ml/min (HCC)   Controlled type 2 diabetes mellitus without complication, without long-term current use of insulin (HCC)   History of CVA (cerebrovascular accident)   Urinary retention   OSA (obstructive sleep apnea)   Seizure disorder (HCC)   GERD without esophagitis     Hospital Course:  Patient is a 72 year old female with remote CVA, complicated by right-sided weakness, CKD stage 4, chronic suprapubic catheter, CAD, chronic diastolic CHF, OSA presented to ED with generalized weakness.  Patient was in her usual state of health until 7/15 when she had the suprapubic cath catheter changed due to urinary retention, had tolerated the procedure well.  Patient then started having new right thigh pain in the evening.  Next morning, she noted the pain was worse when she tried to ambulate.  No fall or any trauma, no back pain.  Also reported headache, nausea, vomiting, diarrhea, abdominal pain/suprapubic.  No focal weakness, vision changes. in ED, CK 36, troponins negative   CT abdomen showed no acute findings, suprapubic catheter decompresses urinary bladder.  Chronic bilateral renal parenchymal atrophy with mild distention of the renal collecting systems, no urolithiasis. CT head showed no acute intracranial abnormality, secretions in the right sphenoid sinus,  correlate with acute sinusitis.   Venous Doppler lower extremity showed occlusive, expansile thrombus in the posterior tibial vein within the infrapopliteal left lower extremity, does not extend into the left popliteal vein.  No evidence of DVT in the right lower extremity   Assessment and Plan:  Right Thigh pain-resolved -Unclear etiology, CK normal.  Venous Doppler with no evidence of DVT in the right lower extremity.  Resolved -PT OT evaluation -> HHPT  -CT right hip and femur negative for any abscess or infectious etiology, no fracture or dislocation -Home health PT OT, HHA, RN arranged      Acute LLE DVT  -Patient was placed on IV heparin drip, transitioned to eliquis  VQ scan negative for PE   Atypical chest pain Hx of CAD  -Complaining of chest pain, appears musculoskeletal, state worse with movements.  -CK20 9, troponin 13.  EKG showed no acute ST-T wave changes suggestive of ischemia. -VQ scan negative for any PE -2D echo was ordered on 7/19, still pending, likely delayed due to IT outage. Patient is doing well, no further chest pain.  Patient and daughter are okay with discharge home and they will follow-up with Dr. Mariah Milling.      Recurrent UTI, CAUTI --Urine culture showed multiple species, possibly colonization -Patient has received 3 days of IV Rocephin       Essential hypertension -BP currently stable       CKD (chronic kidney disease) stage 4, GFR 15-29 ml/min (HCC) -Baseline creatinine 2.4-3.5 -Currently at baseline, creatinine 2.4 at discharge -Torsemide     History of CVA (cerebrovascular accident) -CT  head negative, no focal weakness     Urinary retention, history of chronic suprapubic catheter -Recently treated for UTI patient 11/12/18     OSA (obstructive sleep apnea)       Seizure disorder (HCC) -Continue Keppra, dose decreased to 500 mg twice daily.  Pharmacy recommendations given renal function     GERD without esophagitis -Continue PPI      Morbid obesity (HCC) Estimated body mass index is 35.52 kg/m as calculated from the following:   Height as of this encounter: 5\' 1"  (1.549 m).   Weight as of this encounter: 85.3 kg.  Management discussed with patient's daughter, Coy Saunas prior to discharge, agreed with discharge planning and recommendations.     Pain control - [employer] Controlled Substance Reporting System database was reviewed. and patient was instructed, not to drive, operate heavy machinery, perform activities at heights, swimming or participation in water activities or provide baby-sitting services while on Pain, Sleep and Anxiety Medications; until their outpatient Physician has advised to do so again. Also recommended to not to take more than prescribed Pain, Sleep and Anxiety Medications.  Consultants: None Procedures performed: VQ scan, Doppler lower extremity Disposition: Home Diet recommendation:  Discharge Diet Orders (From admission, onward)     Start     Ordered   11/17/22 0000  Diet Carb Modified        11/17/22 1211            DISCHARGE MEDICATION: Allergies as of 11/17/2022       Reactions   Bactrim [sulfamethoxazole-trimethoprim] Other (See Comments)   Hematuria, dysuria   Latex Rash   Penicillin G Itching   Atorvastatin Other (See Comments)   Myalgias    Other Hives   Adhesive tape   Amlodipine Swelling   Leg swelling   Doxycycline    Blood in urine        Medication List     TAKE these medications    aluminum-magnesium hydroxide-simethicone 200-200-20 MG/5ML Susp Commonly known as: MAALOX Take 30 mLs by mouth 4 (four) times daily -  before meals and at bedtime.   Apixaban Starter Pack (10mg  and 5mg ) Commonly known as: ELIQUIS STARTER PACK Take as directed on package: start with two-5mg  tablets twice daily for 7 days. On day 8 (11/22/2022), switch to one-5mg  tablet twice daily.   apixaban 5 MG Tabs tablet Commonly known as: ELIQUIS Take 1 tablet (5 mg total) by  mouth 2 (two) times daily. Please take this prescription once the eliquis starter pack has been completed. Start taking on: November 21, 2022   benzonatate 100 MG capsule Commonly known as: TESSALON Take 1 capsule (100 mg total) by mouth 2 (two) times daily as needed for cough.   citalopram 20 MG tablet Commonly known as: CELEXA TAKE 1 TABLET BY MOUTH ONCE A DAY   feeding supplement Liqd Take 237 mLs by mouth 2 (two) times daily between meals.   ferrous sulfate 325 (65 FE) MG tablet Take 325 mg by mouth daily with breakfast.   fluticasone 50 MCG/ACT nasal spray Commonly known as: FLONASE Place 2 sprays into both nostrils daily as needed for allergies (allergies).   guaiFENesin 600 MG 12 hr tablet Commonly known as: Mucinex Take 1 tablet (600 mg total) by mouth daily.   hydrALAZINE 10 MG tablet Commonly known as: APRESOLINE Take 10 mg by mouth daily.   levETIRAcetam 500 MG tablet Commonly known as: KEPPRA Take 1 tablet (500 mg total) by mouth 2 (two) times daily.  What changed:  medication strength how much to take   loratadine 10 MG tablet Commonly known as: CLARITIN TAKE 1 TABLET BY MOUTH ONCE A DAY AS NEEDED FOR ALLERGIES   metoCLOPramide 10 MG tablet Commonly known as: REGLAN Take 1 tablet (10 mg total) by mouth every 6 (six) hours as needed.   multivitamin with minerals Tabs tablet Take 1 tablet by mouth daily.   nitroGLYCERIN 0.4 MG SL tablet Commonly known as: NITROSTAT Place 1 tablet (0.4 mg total) under the tongue every 5 (five) minutes as needed for chest pain.   nystatin powder Commonly known as: MYCOSTATIN/NYSTOP Apply 1 Application topically 3 (three) times daily.   ondansetron 4 MG disintegrating tablet Commonly known as: ZOFRAN-ODT Take 1 tablet (4 mg total) by mouth every 8 (eight) hours as needed.   pantoprazole 20 MG tablet Commonly known as: PROTONIX Take 1 tablet (20 mg total) by mouth in the morning. What changed: when to take this    polyethylene glycol 17 g packet Commonly known as: MIRALAX / GLYCOLAX Take 17 g by mouth daily.   Praluent 150 MG/ML Soaj Generic drug: Alirocumab Inject 1 mL (150 mg total) into the skin every 14 (fourteen) days.   torsemide 20 MG tablet Commonly known as: DEMADEX Take 1 tablet (20 mg total) by mouth daily.   traMADol 50 MG tablet Commonly known as: ULTRAM Take 1 tablet (50 mg total) by mouth every 12 (twelve) hours as needed.   Vitamin D3 25 MCG (1000 UT) Caps Take 1 capsule by mouth daily.        Follow-up Information     [name], DO. Schedule an appointment as soon as possible for a visit in 2 week(s).   Specialty: Internal Medicine Why: for hospital follow-up Contact information: [address] [place] [address] [phone]         Antonieta Iba, MD. Schedule an appointment as soon as possible for a visit in 2 week(s).   Specialty: Cardiology Why: for hospital follow-up Contact information: [address] [place] [address] [phone]                Discharge Exam: Ceasar Mons Weights   11/13/22 1437  Weight: 85.3 kg   S: Sitting up in the chair, looking forward to go home today.  No complaints.  No chest pain, shortness of breath, fevers.  BP (!) 123/56 (BP Location: Left Arm)   Pulse (!) 50   Temp 97.6 F (36.4 C)   Resp 18   Ht 5\' 1"  (1.549 m)   Wt 85.3 kg   SpO2 97%   BMI 35.52 kg/m   Physical Exam General: Alert and oriented x 3, NAD Cardiovascular: S1 S2 clear, RRR.  Respiratory: CTAB, no wheezing Gastrointestinal: Soft, nontender, nondistended, NBS Ext: no pedal edema bilaterally Neuro: no new deficits Psych: Normal affect   Condition at discharge: fair  The results of significant diagnostics from this hospitalization (including imaging, microbiology, ancillary and laboratory) are listed below for reference.   Imaging Studies: NM Pulmonary Perfusion  Result Date:  11/16/2022 CLINICAL DATA:  Positive DVT with persistent chest pain EXAM: NUCLEAR MEDICINE PERFUSION LUNG SCAN TECHNIQUE: Perfusion images were obtained in multiple projections after intravenous injection of radiopharmaceutical. Ventilation scans intentionally deferred if perfusion scan and chest x-ray adequate for interpretation during COVID 19 epidemic. RADIOPHARMACEUTICALS:  4.03 mCi Tc-38m MAA IV COMPARISON:  Chest radiograph dated 11/16/2022 FINDINGS: Homogeneous distribution of radiotracer. No peripheral wedge-shaped perfusion defect.  IMPRESSION: Pulmonary embolism absent. Electronically Signed   By: Agustin Cree M.D.   On: 11/16/2022 16:34   DG Chest 2 View  Result Date: 11/16/2022 CLINICAL DATA:  Dyspnea EXAM: CHEST - 2 VIEW COMPARISON:  Chest radiograph 06/10/2022. FINDINGS: The heart is enlarged, unchanged. The upper mediastinal contours are normal Linear opacities in the lung bases most likely reflect atelectasis. There is no focal airspace consolidation or pulmonary edema. There is no pleural effusion or pneumothorax There is no acute osseous abnormality. IMPRESSION: No radiographic evidence of acute cardiopulmonary process. Electronically Signed   By: Lesia Hausen M.D.   On: 11/16/2022 15:48   CT HIP RIGHT WO CONTRAST  Result Date: 11/14/2022 CLINICAL DATA:  Right leg weakness. EXAM: CT OF THE RIGHT HIP AND FEMUR WITHOUT CONTRAST TECHNIQUE: Multidetector CT imaging of the right hip and femur was performed according to the standard protocol. Multiplanar CT image reconstructions were also generated. RADIATION DOSE REDUCTION: This exam was performed according to the departmental dose-optimization program which includes automated exposure control, adjustment of the mA and/or kV according to patient size and/or use of iterative reconstruction technique. COMPARISON:  CT abdomen pelvis from yesterday. FINDINGS: Bones/Joint/Cartilage No fracture or dislocation. Mild right hip and moderate right knee  degenerative changes. Small right knee joint effusion. Ligaments Ligaments are suboptimally evaluated by CT. Muscles and Tendons Grossly intact. Soft tissue No fluid collection or hematoma.  No soft tissue mass. IMPRESSION: 1. No acute osseous abnormality. 2. 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 Electronically Signed   By: Obie Dredge M.D.   On: 11/14/2022 13:42   CT FEMUR RIGHT WO CONTRAST  Result Date: 11/14/2022 CLINICAL DATA:  Right leg weakness. EXAM: CT OF THE RIGHT HIP AND FEMUR WITHOUT CONTRAST TECHNIQUE: Multidetector CT imaging of the right hip and femur was performed according to the standard protocol. Multiplanar CT image reconstructions were also generated. RADIATION DOSE REDUCTION: This exam was performed according to the departmental dose-optimization program which includes automated exposure control, adjustment of the mA and/or kV according to patient size and/or use of iterative reconstruction technique. COMPARISON:  CT abdomen pelvis from yesterday. FINDINGS: Bones/Joint/Cartilage No fracture or dislocation. Mild right hip and moderate right knee degenerative changes. Small right knee joint effusion. Ligaments Ligaments are suboptimally evaluated by CT. Muscles and Tendons Grossly intact. Soft tissue No fluid collection or hematoma.  No soft tissue mass. IMPRESSION: 1. No acute osseous abnormality. 2. 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 Electronically Signed   By: Obie Dredge M.D.   On: 11/14/2022 13:42   US Venous Img Lower Bilateral (DVT)  Result Date: 11/14/2022 CLINICAL DATA:  Bilateral leg pain EXAM: BILATERAL LOWER EXTREMITY VENOUS DOPPLER ULTRASOUND TECHNIQUE: Gray-scale sonography with compression, as well as color and duplex ultrasound, were performed to evaluate the deep venous system(s) from the level of the common femoral vein through the popliteal and proximal calf veins. COMPARISON:  None Available. FINDINGS: VENOUS Normal  compressibility of the common femoral, superficial femoral, and popliteal veins, as well as the visualized right calf veins. Visualized portions of profunda femoral vein and great saphenous vein unremarkable. No filling defects to suggest DVT on grayscale or color Doppler imaging within these vessels. Doppler waveforms show normal direction of venous flow, normal respiratory plasticity and response to augmentation. Within the left lower extremity, there is, however, occlusive, expansile thrombus seen within 1 of the 2 paired posterior tibial veins within the infrapopliteal left lower extremity. This does not extend into the left  popliteal vein. The paired, visualized peroneal veins are patent with appropriate antegrade flow. OTHER None. Limitations: none IMPRESSION: 1. Occlusive, expansile thrombus within 1 of the 2 paired posterior tibial veins within the infrapopliteal left lower extremity. This does not extend into the left popliteal vein. 2. No evidence of deep venous thrombosis within the right lower extremity. Electronically Signed   By: Helyn Numbers M.D.   On: 11/14/2022 00:42   DG FEMUR PORT, MIN 2 VIEWS RIGHT  Result Date: 11/13/2022 CLINICAL DATA:  Right leg pain EXAM: RIGHT FEMUR PORTABLE 2 VIEW COMPARISON:  None Available. FINDINGS: Degenerative changes are demonstrated in the right hip and right knee. No evidence of acute fracture or dislocation. No focal bone lesion or bone destruction. Vascular calcifications in the soft tissues. IMPRESSION: Degenerative changes in the right hip and right knee. No acute bony abnormalities. Electronically Signed   By: Burman Nieves M.D.   On: 11/13/2022 22:05   CT ABDOMEN PELVIS WO CONTRAST  Result Date: 11/13/2022 CLINICAL DATA:  Abdominal pain, acute, nonlocalized EXAM: CT ABDOMEN AND PELVIS WITHOUT CONTRAST TECHNIQUE: Multidetector CT imaging of the abdomen and pelvis was performed following the standard protocol without IV contrast. RADIATION DOSE  REDUCTION: This exam was performed according to the departmental dose-optimization program which includes automated exposure control, adjustment of the mA and/or kV according to patient size and/or use of iterative reconstruction technique. COMPARISON:  09/02/2022 FINDINGS: Lower chest: No pleural or pericardial effusion. Stable linear scarring in the right lung base. Hepatobiliary: No focal liver abnormality is seen. No gallstones, gallbladder wall thickening, or biliary dilatation. Pancreas: Unremarkable. No pancreatic ductal dilatation or surrounding inflammatory changes. Spleen: Normal in size without focal abnormality. Adrenals/Urinary Tract: No adrenal mass. Chronic bilateral renal parenchymal atrophy with mild distention of the renal collecting systems, no urolithiasis. Urinary bladder decompressed by suprapubic catheter which appears well positioned. Stomach/Bowel: Stomach incompletely distended, unremarkable. Small bowel decompressed. Normal appendix. Colon is incompletely distended, without acute finding. Vascular/Lymphatic: Scattered aortoiliac calcified plaque without aneurysm. Stable subcentimeter left para-aortic and aortocaval lymph nodes. No abdominal or pelvic adenopathy. Reproductive: Uterus and bilateral adnexa are unremarkable. Other: No ascites.  No free air. Musculoskeletal: Mild spondylitic changes throughout the visualized lower thoracic and lumbar spine. No fracture or worrisome bone lesion. IMPRESSION: 1. No acute findings. Suprapubic catheter decompresses urinary bladder. 2. Chronic bilateral renal parenchymal atrophy with mild distention of the renal collecting systems, no urolithiasis. 3.  Aortic Atherosclerosis (ICD10-I70.0). Electronically Signed   By: Corlis Leak M.D.   On: 11/13/2022 17:07   CT Head Wo Contrast  Result Date: 11/13/2022 CLINICAL DATA:  Headache, new onset (Age >= 51y) EXAM: CT HEAD WITHOUT CONTRAST TECHNIQUE: Contiguous axial images were obtained from the base of  the skull through the vertex without intravenous contrast. RADIATION DOSE REDUCTION: This exam was performed according to the departmental dose-optimization program which includes automated exposure control, adjustment of the mA and/or kV according to patient size and/or use of iterative reconstruction technique. COMPARISON:  CT Head 09/11/22 FINDINGS: Brain: No hemorrhage. No hydrocephalus. No extra-axial fluid collection. Chronic left occipital lobe infarct. Ex vacuo dilatation of the left occipital horn. No CT evidence of an acute cortical infarct. Vascular: No hyperdense vessel or unexpected calcification. Skull: Normal. Negative for fracture or focal lesion. Sinuses/Orbits: No middle ear or mastoid effusion. Paranasal sinuses are notable frothy secretions in the right sphenoid sinus. Left lens replacement. Orbits are otherwise unremarkable. Other: None. IMPRESSION: 1. No acute intracranial abnormality. 2. Frothy secretions in the  right sphenoid sinus. Correlate for symptoms of acute sinusitis. Electronically Signed   By: Lorenza Cambridge M.D.   On: 11/13/2022 16:58    Microbiology: Results for orders placed or performed during the hospital encounter of 11/13/22  Urine Culture     Status: Abnormal   Collection Time: 11/13/22  5:00 PM   Specimen: Urine, Catheterized  Result Value Ref Range Status   Specimen Description   Final    URINE, CATHETERIZED Performed at [facility], [address] [place], [place] [address]    Special Requests   Final    NONE Performed at [facility], [address] [place], [place] [address]    Culture MULTIPLE SPECIES PRESENT, SUGGEST RECOLLECTION (A)  Final   Report Status 11/15/2022 FINAL  Final    Labs: CBC: Recent Labs  Lab 11/13/22 1507 11/14/22 0748 11/15/22 0422 11/16/22 0454 11/17/22 0417  WBC 5.5 4.4 3.1* 2.5* 2.8*  HGB 12.8 11.7* 11.6* 11.7* 12.3  HCT 38.0 36.0 34.7* 34.3* 37.0  MCV 97.7 100.6* 98.0 96.6 99.7  PLT 154 157  173 191 226   Basic Metabolic Panel: Recent Labs  Lab 11/13/22 1507 11/13/22 1700 11/14/22 0748 11/15/22 0422 11/16/22 0454 11/17/22 0417  NA 134*  --  135 135 133* 135  K 4.1  --  4.0 4.0 3.7 3.9  CL 104  --  107 105 103 106  CO2 21*  --  20* 21* 21* 25  GLUCOSE 91  --  78 97 81 81  BUN 38*  --  38* 40* 37* 41*  CREATININE 2.67*  --  2.43* 2.66* 2.53* 2.42*  CALCIUM 9.1  --  8.7* 8.8* 8.9 9.0  PHOS  --  2.7  --   --   --   --    Liver Function Tests: Recent Labs  Lab 11/13/22 1500  AST 31  ALT 23  ALKPHOS 88  BILITOT 0.9  PROT 7.5  ALBUMIN 3.4*   CBG: No results for input(s): "GLUCAP" in the last 168 hours.  Discharge time spent: greater than 30 minutes.  Signed: Thad Ranger, MD [facility] 11/17/2022

## 2022-11-17 NOTE — TOC Progression Note (Signed)
Transition of Care [facility]) - Progression Note    Patient Details  Name: Leslie Houston MRN: [id number] Date of Birth: 05-20-1950  Transition of [facility]) CM/SW Contact  Hetty Ely, RN Phone Number: 11/17/2022, 12:43 PM  Clinical Narrative:  CM spoke with [facility], who will arrange HHSN/PT/OT services when discharged. MD notified.          Expected Discharge Plan and Services         Expected Discharge Date: 11/17/22                                     Social Determinants of Health (SDOH) Interventions SDOH Screenings   Food Insecurity: No Food Insecurity (11/14/2022)  Recent Concern: Food Insecurity - Food Insecurity Present (10/12/2022)  Housing: Low Risk  (11/14/2022)  Transportation Needs: No Transportation Needs (11/14/2022)  Utilities: Not At Risk (11/14/2022)  Alcohol Screen: Low Risk  (10/12/2022)  Depression (PHQ2-9): Low Risk  (10/12/2022)  Financial Resource Strain: Low Risk  (10/12/2022)  Physical Activity: Insufficiently Active (10/12/2022)  Social Connections: Socially Isolated (10/12/2022)  Stress: No Stress Concern Present (10/10/2021)  Tobacco Use: Medium Risk (11/13/2022)    Readmission Risk Interventions    11/15/2022   10:31 AM 03/17/2022   11:49 AM  Readmission Risk Prevention Plan  Transportation Screening Complete Complete  PCP or Specialist Appt within 3-5 Days  Complete  HRI or Home Care Consult  Complete  Social Work Consult for Recovery Care Planning/Counseling  Complete  Palliative Care Screening  Not Applicable  Medication Review Oceanographer) Complete Complete  PCP or Specialist appointment within 3-5 days of discharge Complete   HRI or Home Care Consult Complete   SW Recovery Care/Counseling Consult Complete

## 2022-11-17 NOTE — Discharge Instructions (Addendum)
Information on my medicine - ELIQUIS (apixaban)  This medication education was reviewed with me or my healthcare representative as part of my discharge preparation.    Why was Eliquis prescribed for you? Eliquis was prescribed to treat blood clots that may have been found in the veins of your legs (deep vein thrombosis) or in your lungs (pulmonary embolism) and to reduce the risk of them occurring again.  What do You need to know about Eliquis ? The starting dose is 10 mg (two 5 mg tablets) taken TWICE daily for the FIRST SEVEN (7) DAYS, then on 11/22/22 the dose is reduced to ONE 5 mg tablet taken TWICE daily.  Eliquis may be taken with or without food.   Try to take the dose about the same time in the morning and in the evening. If you have difficulty swallowing the tablet whole please discuss with your pharmacist how to take the medication safely.  Take Eliquis exactly as prescribed and DO NOT stop taking Eliquis without talking to the doctor who prescribed the medication.  Stopping may increase your risk of developing a new blood clot.  Refill your prescription before you run out.  After discharge, you should have regular check-up appointments with your healthcare provider that is prescribing your Eliquis.    What do you do if you miss a dose? If a dose of ELIQUIS is not taken at the scheduled time, take it as soon as possible on the same day and twice-daily administration should be resumed. The dose should not be doubled to make up for a missed dose.  Important Safety Information A possible side effect of Eliquis is bleeding. You should call your healthcare provider right away if you experience any of the following: Bleeding from an injury or your nose that does not stop. Unusual colored urine (red or dark brown) or unusual colored stools (red or black). Unusual bruising for unknown reasons. A serious fall or if you hit your head (even if there is no bleeding).  Some medicines  may interact with Eliquis and might increase your risk of bleeding or clotting while on Eliquis. To help avoid this, consult your healthcare provider or pharmacist prior to using any new prescription or non-prescription medications, including herbals, vitamins, non-steroidal anti-inflammatory drugs (NSAIDs) and supplements.  This website has more information on Eliquis (apixaban): http://[email or web address].com/eliquis/home       [facility] pt/ot/RN

## 2022-11-19 ENCOUNTER — Ambulatory Visit: Payer: Self-pay

## 2022-11-19 ENCOUNTER — Telehealth: Payer: Self-pay | Admitting: Internal Medicine

## 2022-11-19 ENCOUNTER — Telehealth: Payer: Self-pay

## 2022-11-19 NOTE — Transitions of Care (Post Inpatient/ED Visit) (Signed)
11/19/2022  Name: Leslie Houston MRN: [id number] DOB: 12/22/1950  Today's TOC FU Call Status: Today's TOC FU Call Status:: Successful TOC FU Call Competed Unsuccessful Call (1st Attempt) Date: 11/19/22 [facility] FU Call Complete Date: 11/19/22  Transition Care Management Follow-up Telephone Call Date of Discharge: 11/17/22 Discharge Facility: [facility]) Type of Discharge: Inpatient Admission Primary Inpatient Discharge Diagnosis:: UTI How have you been since you were released from the hospital?: Better  Items Reviewed: Did you receive and understand the discharge instructions provided?: No Medications obtained,verified, and reconciled?: Yes (Medications Reviewed) Any new allergies since your discharge?: No Dietary orders reviewed?: Yes Do you have support at home?: Yes People in Home: child(ren), adult  Medications Reviewed Today: Medications Reviewed Today     Reviewed by Karena Addison, LPN (Licensed Practical Nurse) on 11/19/22 at 1233  Med List Status: <None>   Medication Order Taking? Sig Documenting Provider Last Dose Status Informant  Alirocumab (PRALUENT) 150 MG/ML SOAJ [id number] No Inject 1 mL (150 mg total) into the skin every 14 (fourteen) days. Marykay Lex, MD Past Month Active Self  aluminum-magnesium hydroxide-simethicone (MAALOX) 200-200-20 MG/5ML SUSP [id number] No Take 30 mLs by mouth 4 (four) times daily -  before meals and at bedtime. Sharman Cheek, MD unk unk Active Self  apixaban (ELIQUIS) 5 MG TABS tablet [id number]  Take 1 tablet (5 mg total) by mouth 2 (two) times daily. Please take this prescription once the eliquis starter pack has been completed. Rai, Delene Ruffini, MD  Active   APIXABAN Everlene Balls) VTE STARTER PACK (10MG  AND 5MG ) [id number]  Take as directed on package: start with two-5mg  tablets twice daily for 7 days. On day 8 (11/22/2022), switch to one-5mg  tablet twice daily. Rai, Delene Ruffini, MD  Active   benzonatate  (TESSALON) 100 MG capsule [id number] No Take 1 capsule (100 mg total) by mouth 2 (two) times daily as needed for cough. [name], DO unk un Active Self  Cholecalciferol (VITAMIN D3) 25 MCG (1000 UT) CAPS [id number] No Take 1 capsule by mouth daily. [provider] 11/13/2022 Active Self, Child  citalopram (CELEXA) 20 MG tablet [id number] No TAKE 1 TABLET BY MOUTH ONCE A Clarisa Schools, DO 11/13/2022 Active Self  feeding supplement (ENSURE ENLIVE / ENSURE PLUS) LIQD [id number] No Take 237 mLs by mouth 2 (two) times daily between meals. [name], MD Past Week Active Self  ferrous sulfate 325 (65 FE) MG tablet [id number] No Take 325 mg by mouth daily with breakfast. [provider] 11/13/2022 Active Self, Child  fluticasone (FLONASE) 50 MCG/ACT nasal spray [id number] No Place 2 sprays into both nostrils daily as needed for allergies (allergies).  [provider] unk unk Active Self, Child  guaiFENesin (MUCINEX) 600 MG 12 hr tablet [id number] No Take 1 tablet (600 mg total) by mouth daily. [name], DO unk unk Active Self  hydrALAZINE (APRESOLINE) 10 MG tablet [id number] No Take 10 mg by mouth daily. [provider] 11/13/2022 Active Self  levETIRAcetam (KEPPRA) 500 MG tablet [id number]  Take 1 tablet (500 mg total) by mouth 2 (two) times daily. Rai, Delene Ruffini, MD  Active   loratadine (CLARITIN) 10 MG tablet [id number] No TAKE 1 TABLET BY MOUTH ONCE A DAY AS NEEDED FOR ALLERGIES Mecum, Erin E, PA-C unk unk Active Self, Child  metoCLOPramide (REGLAN) 10 MG tablet [id number] No Take 1 tablet (10 mg total) by mouth every 6 (six) hours as needed. Sharman Cheek, MD Past Week Active Self  Multiple  Vitamin (MULTIVITAMIN WITH MINERALS) TABS tablet [id number] No Take 1 tablet by mouth daily. [name], MD 11/13/2022 Active Self  nitroGLYCERIN (NITROSTAT) 0.4 MG SL tablet [id number]  Place 1 tablet (0.4 mg total) under the tongue every 5 (five)  minutes as needed for chest pain. Rai, Delene Ruffini, MD  Active   nystatin (MYCOSTATIN/NYSTOP) powder [id number] No Apply 1 Application topically 3 (three) times daily. Vanna Scotland, MD Past Week Active Self  ondansetron (ZOFRAN-ODT) 4 MG disintegrating tablet [id number]  Take 1 tablet (4 mg total) by mouth every 8 (eight) hours as needed. Rai, Delene Ruffini, MD  Active   pantoprazole (PROTONIX) 20 MG tablet [id number]  Take 1 tablet (20 mg total) by mouth in the morning. Rai, Delene Ruffini, MD  Active   polyethylene glycol (MIRALAX / GLYCOLAX) 17 g packet [id number] No Take 17 g by mouth daily. [name], MD Past Week Active Self  torsemide (DEMADEX) 20 MG tablet [id number] No Take 1 tablet (20 mg total) by mouth daily. Antonieta Iba, MD 11/13/2022 Active Self  traMADol (ULTRAM) 50 MG tablet [id number] No Take 1 tablet (50 mg total) by mouth every 12 (twelve) hours as needed. Faythe Ghee, PA-C unk unk Active Self            Home Care and Equipment/Supplies: Were Home Health Services Ordered?: Yes Name of Home Health Agency:: [facility] Has Agency set up a time to come to your home?: No Any new equipment or medical supplies ordered?: NA  Functional Questionnaire: Do you need assistance with bathing/showering or dressing?: No Do you need assistance with meal preparation?: No Do you need assistance with eating?: No Do you have difficulty maintaining continence: No Do you need assistance with getting out of bed/getting out of a chair/moving?: No Do you have difficulty managing or taking your medications?: No  Follow up appointments reviewed: PCP Follow-up appointment confirmed?: No (no avail appts, sent message to staff to schedule) MD Provider Line [phone] Given: No Specialist Hospital Follow-up appointment confirmed?: NA Do you need transportation to your follow-up appointment?: No Do you understand care options if your condition(s) worsen?: Yes-patient verbalized  understanding    SIGNATURE Karena Addison, LPN [facility] Nurse Health Advisor Direct Dial [phone]

## 2022-11-19 NOTE — Transitions of Care (Post Inpatient/ED Visit) (Signed)
   11/19/2022  Name: Leslie Houston MRN: [id number] DOB: May 26, 1950  Today's TOC FU Call Status: Today's TOC FU Call Status:: Unsuccessul Call (1st Attempt) Unsuccessful Call (1st Attempt) Date: 11/19/22  Attempted to reach the patient regarding the most recent Inpatient/ED visit.  Follow Up Plan: Additional outreach attempts will be made to reach the patient to complete the Transitions of Care (Post Inpatient/ED visit) call.   Signature Karena Addison, LPN [facility] Nurse Health Advisor Direct Dial [phone]

## 2022-11-19 NOTE — Telephone Encounter (Signed)
Copied from CRM [id number]. Topic: General - Inquiry >> Nov 19, 2022 11:45 AM Marlow Baars wrote: Reason for CRM: Corrie Dandy with [facility] called in to ask the provider if it would be ok to cancel the home health aide as OT has been ordered and with that a nurse will come in 3 times a week as opposed to once a week with the Home Health aide. Please assist further.

## 2022-11-19 NOTE — Telephone Encounter (Signed)
Verbal given 

## 2022-11-20 ENCOUNTER — Telehealth: Payer: Self-pay | Admitting: Internal Medicine

## 2022-11-20 ENCOUNTER — Telehealth: Payer: Self-pay | Admitting: Cardiovascular Disease

## 2022-11-20 ENCOUNTER — Emergency Department
Admission: EM | Admit: 2022-11-20 | Discharge: 2022-11-20 | Disposition: A | Discharge status: Home / Self Care | Payer: Medicare Other | Source: Home / Self Care | Attending: Emergency Medicine | Admitting: Emergency Medicine

## 2022-11-20 ENCOUNTER — Other Ambulatory Visit: Payer: Self-pay

## 2022-11-20 DIAGNOSIS — F4310 Post-traumatic stress disorder, unspecified: Secondary | ICD-10-CM | POA: Diagnosis not present

## 2022-11-20 DIAGNOSIS — F32A Depression, unspecified: Secondary | ICD-10-CM | POA: Diagnosis not present

## 2022-11-20 DIAGNOSIS — N390 Urinary tract infection, site not specified: Secondary | ICD-10-CM | POA: Diagnosis not present

## 2022-11-20 DIAGNOSIS — M1611 Unilateral primary osteoarthritis, right hip: Secondary | ICD-10-CM | POA: Diagnosis not present

## 2022-11-20 DIAGNOSIS — I700 Atherosclerosis of aorta: Secondary | ICD-10-CM | POA: Diagnosis not present

## 2022-11-20 DIAGNOSIS — I951 Orthostatic hypotension: Secondary | ICD-10-CM | POA: Diagnosis not present

## 2022-11-20 DIAGNOSIS — N189 Chronic kidney disease, unspecified: Secondary | ICD-10-CM | POA: Insufficient documentation

## 2022-11-20 DIAGNOSIS — R339 Retention of urine, unspecified: Secondary | ICD-10-CM | POA: Diagnosis not present

## 2022-11-20 DIAGNOSIS — K219 Gastro-esophageal reflux disease without esophagitis: Secondary | ICD-10-CM | POA: Diagnosis not present

## 2022-11-20 DIAGNOSIS — R001 Bradycardia, unspecified: Secondary | ICD-10-CM | POA: Diagnosis not present

## 2022-11-20 DIAGNOSIS — F419 Anxiety disorder, unspecified: Secondary | ICD-10-CM | POA: Diagnosis not present

## 2022-11-20 DIAGNOSIS — Z6835 Body mass index (BMI) 35.0-35.9, adult: Secondary | ICD-10-CM | POA: Diagnosis not present

## 2022-11-20 DIAGNOSIS — M1711 Unilateral primary osteoarthritis, right knee: Secondary | ICD-10-CM | POA: Diagnosis not present

## 2022-11-20 DIAGNOSIS — I130 Hypertensive heart and chronic kidney disease with heart failure and stage 1 through stage 4 chronic kidney disease, or unspecified chronic kidney disease: Secondary | ICD-10-CM | POA: Diagnosis not present

## 2022-11-20 DIAGNOSIS — I82442 Acute embolism and thrombosis of left tibial vein: Secondary | ICD-10-CM | POA: Diagnosis not present

## 2022-11-20 DIAGNOSIS — R0989 Other specified symptoms and signs involving the circulatory and respiratory systems: Secondary | ICD-10-CM | POA: Diagnosis not present

## 2022-11-20 DIAGNOSIS — E1122 Type 2 diabetes mellitus with diabetic chronic kidney disease: Secondary | ICD-10-CM | POA: Insufficient documentation

## 2022-11-20 DIAGNOSIS — I2510 Atherosclerotic heart disease of native coronary artery without angina pectoris: Secondary | ICD-10-CM | POA: Diagnosis not present

## 2022-11-20 DIAGNOSIS — M109 Gout, unspecified: Secondary | ICD-10-CM | POA: Diagnosis not present

## 2022-11-20 DIAGNOSIS — H547 Unspecified visual loss: Secondary | ICD-10-CM | POA: Diagnosis not present

## 2022-11-20 DIAGNOSIS — I5032 Chronic diastolic (congestive) heart failure: Secondary | ICD-10-CM | POA: Diagnosis not present

## 2022-11-20 DIAGNOSIS — I69351 Hemiplegia and hemiparesis following cerebral infarction affecting right dominant side: Secondary | ICD-10-CM | POA: Diagnosis not present

## 2022-11-20 DIAGNOSIS — N184 Chronic kidney disease, stage 4 (severe): Secondary | ICD-10-CM | POA: Diagnosis not present

## 2022-11-20 DIAGNOSIS — I509 Heart failure, unspecified: Secondary | ICD-10-CM | POA: Insufficient documentation

## 2022-11-20 DIAGNOSIS — G4733 Obstructive sleep apnea (adult) (pediatric): Secondary | ICD-10-CM | POA: Diagnosis not present

## 2022-11-20 DIAGNOSIS — I10 Essential (primary) hypertension: Secondary | ICD-10-CM | POA: Diagnosis not present

## 2022-11-20 DIAGNOSIS — G40909 Epilepsy, unspecified, not intractable, without status epilepticus: Secondary | ICD-10-CM | POA: Diagnosis not present

## 2022-11-20 DIAGNOSIS — I69398 Other sequelae of cerebral infarction: Secondary | ICD-10-CM | POA: Diagnosis not present

## 2022-11-20 DIAGNOSIS — T83510D Infection and inflammatory reaction due to cystostomy catheter, subsequent encounter: Secondary | ICD-10-CM | POA: Diagnosis not present

## 2022-11-20 LAB — COMPREHENSIVE METABOLIC PANEL
ALT: 41 U/L (ref 0–44)
AST: 37 U/L (ref 15–41)
Albumin: 3.4 g/dL — ABNORMAL LOW (ref 3.5–5.0)
Alkaline Phosphatase: 84 U/L (ref 38–126)
Anion gap: 7 (ref 5–15)
BUN: 46 mg/dL — ABNORMAL HIGH (ref 8–23)
CO2: 19 mmol/L — ABNORMAL LOW (ref 22–32)
Calcium: 8.8 mg/dL — ABNORMAL LOW (ref 8.9–10.3)
Chloride: 109 mmol/L (ref 98–111)
Creatinine, Ser: 2.42 mg/dL — ABNORMAL HIGH (ref 0.44–1.00)
GFR, Estimated: 21 mL/min — ABNORMAL LOW (ref >60)
Glucose, Bld: 86 mg/dL (ref 70–99)
Potassium: 5.1 mmol/L (ref 3.5–5.1)
Sodium: 135 mmol/L (ref 135–145)
Total Bilirubin: 1 mg/dL (ref 0.3–1.2)
Total Protein: 7.3 g/dL (ref 6.5–8.1)

## 2022-11-20 LAB — TROPONIN I (HIGH SENSITIVITY): Troponin I (High Sensitivity): 14 ng/L (ref <18)

## 2022-11-20 LAB — CBC
HCT: 37.3 % (ref 36.0–46.0)
Hemoglobin: 12.3 g/dL (ref 12.0–15.0)
MCH: 32.7 pg (ref 26.0–34.0)
MCHC: 33 g/dL (ref 30.0–36.0)
MCV: 99.2 fL (ref 80.0–100.0)
Platelets: 254 10*3/uL (ref 150–400)
RBC: 3.76 MIL/uL — ABNORMAL LOW (ref 3.87–5.11)
RDW: 12.2 % (ref 11.5–15.5)
WBC: 4.4 10*3/uL (ref 4.0–10.5)
nRBC: 0 % (ref 0.0–0.2)

## 2022-11-20 LAB — TSH: TSH: 0.451 u[IU]/mL (ref 0.350–4.500)

## 2022-11-20 NOTE — Telephone Encounter (Signed)
Home Health Verbal Orders - Caller/Agency: Verlon Au / [facility] Callback Number: [phone] Requesting OT/PT/Skilled Nursing/Social Work/Speech Therapy: CHF Frequency: 1w9

## 2022-11-20 NOTE — Telephone Encounter (Signed)
Patient's daughter states that they were suppose to have tests done in hospital, but due to issues with tech, was unable to complete these test. Needing to schedule what was suppose to be done in hospital, but needing orders.

## 2022-11-20 NOTE — Telephone Encounter (Signed)
Called patient, spoke with daughter- okay per DPR. Advised that they had a follow up scheduled for tomorrow and they would discuss getting the follow up testing ordered and scheduled.  Patient daughter verbalized understanding.

## 2022-11-20 NOTE — Discharge Instructions (Addendum)
As we discussed your workup today has shown overall normal results.  Please call the number provided for cardiology to arrange a follow-up appointment.  Return to the emergency department for any symptom personally concerning to yourself.

## 2022-11-20 NOTE — Progress Notes (Signed)
During routine rounding this Chap introduced Spiritual care to Pt and family. Pt and family shared relating issues surrounding Pt current condition. Mirna Mires offered active listening and comfort to them as they were both grieving loss of loved ones in their families as well as now dealing with this sickness.   11/20/22 1400  Spiritual Encounters  Type of Visit Initial  Care provided to: Pt and family  Referral source Chaplain assessment  Reason for visit Routine spiritual support  OnCall Visit No  Spiritual Framework  Presenting Themes Courage hope and growth;Significant life change  Community/Connection Family  Interventions  Spiritual Care Interventions Made Compassionate presence;Narrative/life review;Mindfulness intervention  Intervention Outcomes  Outcomes Connection to spiritual care;Awareness of support;Reduced isolation  Spiritual Care Plan  Spiritual Care Issues Still Outstanding No further spiritual care needs at this time (see row info)

## 2022-11-20 NOTE — ED Triage Notes (Signed)
Patient to ED via [facility] from home. Patient states that a nurse had been coming to her house to check on her since a recent hospitalization. The nurse came today and checked her vitals to find that her HR was in the 40's. The nurse proceeded to call EMS at that time. Patient is currently complaining of a headache. Patient is A&O x 4.

## 2022-11-20 NOTE — ED Provider Notes (Signed)
[facility] Provider Note    Event Date/Time   First MD Initiated Contact with Patient 11/20/22 1312     (approximate)  History   Chief Complaint: Bradycardia  HPI  Leslie Houston is a 72 y.o. female with a past medical history of CHF, CKD, diabetes, hypertension, hyperlipidemia, recently diagnosed DVT on Eliquis who presents to the emergency department for low heart rate.  According to the daughter patient was discharged from the hospital recently, since her discharge they have had a home health nurse that comes and checks on the patient.  Today they noted the patient to have a heart rate in the 40s they were concerned so they called EMS to bring the patient to the emergency department.  Here the patient is awake alert she has no complaints.  No chest pain no shortness of breath.  Patient's blood pressure is normal 150/77 although the patient does remain bradycardic around 45 to 48 bpm.  Patient will occasionally drop to around 42 bpm and will range as high as around 50 bpm.  I reviewed the patient's last several EKGs and she tends to run between 52 and 60 bpm at baseline.  Physical Exam   Triage Vital Signs: ED Triage Vitals  Encounter Vitals Group     BP 11/20/22 1311 (!) 154/77     Systolic BP Percentile --      Diastolic BP Percentile --      Pulse Rate 11/20/22 1311 (!) 45     Resp 11/20/22 1311 19     Temp 11/20/22 1311 98 F (36.7 C)     Temp Source 11/20/22 1311 Oral     SpO2 11/20/22 1311 100 %     Weight 11/20/22 1312 188 lb (85.3 kg)     Height 11/20/22 1312 5\' 1"  (1.549 m)     Head Circumference --      Peak Flow --      Pain Score 11/20/22 1312 5     Pain Loc --      Pain Education --      Exclude from Growth Chart --     Most recent vital signs: Vitals:   11/20/22 1311  BP: (!) 154/77  Pulse: (!) 45  Resp: 19  Temp: 98 F (36.7 C)  SpO2: 100%    General: Awake, no distress.  CV:  Good peripheral perfusion.  Bradycardic  around 50 bpm. Resp:  Normal effort.  Equal breath sounds bilaterally.  Abd:  No distention.  Soft, nontender.  No rebound or guarding.   ED Results / Procedures / Treatments   EKG  EKG viewed and interpreted by myself shows a sinus rhythm at 47 bpm with a narrow QRS, normal axis, PR prolongation otherwise normal intervals with nonspecific but no concerning ST changes.   MEDICATIONS ORDERED IN ED: Medications - No data to display   IMPRESSION / MDM / ASSESSMENT AND PLAN / ED COURSE  I reviewed the triage vital signs and the nursing notes.  Patient's presentation is most consistent with acute presentation with potential threat to life or bodily function.  Patient presents to the emergency department for bradycardia.  Patient found to be bradycardic by home health nurse and EMS was called to bring the patient to the emergency department.  Patient appears to be asymptomatic denies any lightheadedness dizziness chest pain shortness of breath.  Does not appear to be on a beta-blocker or calcium channel blocker medications per record review.  In reviewing the  patient's past EKG she appears to always have bradycardia usually in the 50s however.  Patient follows up with Dr. Cinda Quest in for cardiology.  We will check labs including a CBC chemistry cardiac enzyme and TSH.  As long as the patient's lab work does not appear to show any significant finding anticipate that the patient could be discharged home to follow-up with her cardiologist as she is otherwise asymptomatic with a normal to hypertensive blood pressure.  Patient and daughter are agreeable to plan.  Patient's lab work is reassuring, chemistry shows no significant findings.  Creatinine 2.4 which is baseline for the patient.  Troponin is negative TSH is normal.  Given the patient's reassuring workup I believe the patient is safe for discharge home as she is otherwise asymptomatic with a normal blood pressure.  She will follow-up with Dr. Mariah Milling  of cardiology.  Daughter agreeable to plan of care.  FINAL CLINICAL IMPRESSION(S) / ED DIAGNOSES   Bradycardia   Note:  This document was prepared using Dragon voice recognition software and may include unintentional dictation errors.   Minna Antis, MD 11/20/22 [phone]

## 2022-11-21 ENCOUNTER — Other Ambulatory Visit
Admission: RE | Admit: 2022-11-21 | Discharge: 2022-11-21 | Disposition: A | Discharge status: Home / Self Care | Payer: Medicare Other | Attending: Medical | Admitting: Medical

## 2022-11-21 ENCOUNTER — Encounter: Payer: Self-pay | Admitting: Medical

## 2022-11-21 ENCOUNTER — Ambulatory Visit: Payer: Medicare Other | Attending: Medical | Admitting: Medical

## 2022-11-21 ENCOUNTER — Ambulatory Visit: Payer: Medicare Other

## 2022-11-21 VITALS — BP 151/83 | HR 47 | Ht 61.0 in | Wt 185.6 lb

## 2022-11-21 DIAGNOSIS — I25118 Atherosclerotic heart disease of native coronary artery with other forms of angina pectoris: Secondary | ICD-10-CM | POA: Insufficient documentation

## 2022-11-21 DIAGNOSIS — I10 Essential (primary) hypertension: Secondary | ICD-10-CM | POA: Diagnosis not present

## 2022-11-21 DIAGNOSIS — E782 Mixed hyperlipidemia: Secondary | ICD-10-CM | POA: Diagnosis not present

## 2022-11-21 DIAGNOSIS — I5032 Chronic diastolic (congestive) heart failure: Secondary | ICD-10-CM | POA: Diagnosis not present

## 2022-11-21 DIAGNOSIS — R001 Bradycardia, unspecified: Secondary | ICD-10-CM | POA: Insufficient documentation

## 2022-11-21 DIAGNOSIS — I82402 Acute embolism and thrombosis of unspecified deep veins of left lower extremity: Secondary | ICD-10-CM | POA: Diagnosis not present

## 2022-11-21 LAB — MAGNESIUM: Magnesium: 2.8 mg/dL — ABNORMAL HIGH (ref 1.7–2.4)

## 2022-11-21 NOTE — Progress Notes (Signed)
Cardiology Office Note:    Date:  11/21/2022   ID:  Leslie Houston, DOB 01/30/1951, MRN [id number]  PCP:  [name], DO  [facility] Cardiologist:  Julien Nordmann, MD  [facility] Electrophysiologist:  None   Referring MD: [name], DO   Chief Complaint: hospital follow-up  History of Present Illness:    Leslie Houston is a 72 y.o. female with a hx of HFpEF, CAD s/p remote stenting, carotid artery stenosis, hypertension, hyperlipidemia, CKD stage IV, CVA, and anemia who presents for follow-up.  Patient underwent coronary stenting in 2000 although details inclear. March 2021 she was admitted with HFpEF and worsening renal function and noted to be anemic. Echo showed LVEF 50-55%, no significant valvular abnormalities, G2DD. Sinus pause noted on telemetry however this was felt to be vagal in etiology and self resolved.   She was last seen in April 2024 and was overall stable from a cardiac perspective.  More recently, the patient was hospitalized 7/16-7/20 for UTI and DVT. She was started on Eliquis therapy for DVT. Patient also reported atypical chest pain in the hospital, troponins negative. Echo was ordered, but not performed.  The patient was subsequently seen in the ER 11/21/22 for bradycardia. It was noted patient had a heart rate in the 40s and it was recommended she go to there ER. Baseline heart rate low in the 50s. Patient denied any symptoms. In the ER heart rate 45bpm. EKG showed SB 47bpm with prolonged PRI. Labs were unremarkable. She was discharged home.   Today, EKG shows SB with HR 45bpm, 1st degree AV block. The patient is overall asymptomatic. She denies lightheadedness, dizziness, shortness of breath, chest pain, LLE, orthopnea, pnd or palpitations.  She does not feel like she is going to faint.  She is not on any rate lowering medications. Upon ambulation, heart rate went up into the mid 60s.   Past Medical History:  Diagnosis Date   (HFpEF)  heart failure with preserved ejection fraction (HCC)    a. Pt reports in 2000 she had CHF, details unclear, outside hospital; b. 06/2019 Echo: EF 50-55%, no rwma, Gr2 DD,  mildly dil LA, mild MR.   Acute left PCA stroke (HCC) 06/22/2014   Anxiety    a.) on BZO (alprazolam) PRN   CAD (coronary artery disease)    a. Pt reports in 2000 she had a stent to a coronary artery, details unclear, outside hospital.   Carotid stenosis    a. CT angio head 07/2014 - 50-60% stenoses of prox ICA bilaterally.   CKD (chronic kidney disease), stage IV (HCC)    Depression    Diabetes mellitus (HCC)    Denies.   Edentulous    No upper teeth   Family hx of colon cancer 06/13/2018   Father diagnosed in his 2's   Gout    History of cataract extraction, left 03/21/2021   HOH (hard of hearing)    usually needs to read lips   Hyperlipidemia    Hypertension    Intellectual disability    a.) born premature, limited formal education, has never driven   Myalgia due to statin    Orthostatic hypotension    OSA (obstructive sleep apnea)    a.) not on nocturnal PAP therapy; old/nonfuntioning DME - referred for repeat PSG 01/2022 by neurology   Osteopenia 01/24/2018   DEXA Sept 2019   PONV (postoperative nausea and vomiting)    PTSD (post-traumatic stress disorder)        PUD (  peptic ulcer disease)    PVC's (premature ventricular contractions)    Right sided weakness 05/2014   a.) s/p CVA   Seizures (HCC)    Sinus bradycardia    Syncope    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)     Past Surgical History:  Procedure Laterality Date   BLADDER SURGERY     CATARACT EXTRACTION W/PHACO Left 03/21/2021   Procedure: CATARACT EXTRACTION PHACO AND INTRAOCULAR LENS PLACEMENT (IOC) left 5.86 00:38.0;  Surgeon: Galen Manila, MD;  Location: [facility];  Service: Ophthalmology;  Laterality: Left;  Latex   CESAREAN SECTION     CORONARY ANGIOPLASTY WITH STENT PLACEMENT     CYSTOSCOPY N/A  03/12/2022   Procedure: CYSTOSCOPY;  Surgeon: Vanna Scotland, MD;  Location: [facility];  Service: Urology;  Laterality: N/A;   DENTAL SURGERY     INSERTION OF SUPRAPUBIC CATHETER N/A 03/12/2022   Procedure: INSERTION OF SUPRAPUBIC CATHETER;  Surgeon: Vanna Scotland, MD;  Location: [facility];  Service: Urology;  Laterality: N/A;   LAPAROTOMY N/A 03/22/2022   Procedure: EXPLORATORY LAPAROTOMY; LYSIS OF ADHESIONS;  Surgeon: Sung Amabile, DO;  Location: [facility];  Service: General;  Laterality: N/A;   RECTAL EXAM UNDER ANESTHESIA N/A 03/12/2022   Procedure: PELVIC EXAM UNDER ANESTHESIA;  Surgeon: Vanna Scotland, MD;  Location: [facility];  Service: Urology;  Laterality: N/A;    Current Medications: Current Meds  Medication Sig   aluminum-magnesium hydroxide-simethicone (MAALOX) 200-200-20 MG/5ML SUSP Take 30 mLs by mouth 4 (four) times daily -  before meals and at bedtime.   apixaban (ELIQUIS) 5 MG TABS tablet Take 1 tablet (5 mg total) by mouth 2 (two) times daily. Please take this prescription once the eliquis starter pack has been completed.   benzonatate (TESSALON) 100 MG capsule Take 1 capsule (100 mg total) by mouth 2 (two) times daily as needed for cough.   Cholecalciferol (VITAMIN D3) 25 MCG (1000 UT) CAPS Take 1 capsule by mouth daily.   citalopram (CELEXA) 20 MG tablet TAKE 1 TABLET BY MOUTH ONCE A DAY   feeding supplement (ENSURE ENLIVE / ENSURE PLUS) LIQD Take 237 mLs by mouth 2 (two) times daily between meals.   ferrous sulfate 325 (65 FE) MG tablet Take 325 mg by mouth daily with breakfast.   fluticasone (FLONASE) 50 MCG/ACT nasal spray Place 2 sprays into both nostrils daily as needed for allergies (allergies).    guaiFENesin (MUCINEX) 600 MG 12 hr tablet Take 1 tablet (600 mg total) by mouth daily.   hydrALAZINE (APRESOLINE) 10 MG tablet Take 10 mg by mouth daily.   levETIRAcetam (KEPPRA) 500 MG tablet Take 1 tablet (500 mg total) by mouth 2 (two) times daily.   loratadine  (CLARITIN) 10 MG tablet TAKE 1 TABLET BY MOUTH ONCE A DAY AS NEEDED FOR ALLERGIES   metoCLOPramide (REGLAN) 10 MG tablet Take 1 tablet (10 mg total) by mouth every 6 (six) hours as needed.   Multiple Vitamin (MULTIVITAMIN WITH MINERALS) TABS tablet Take 1 tablet by mouth daily.   nitroGLYCERIN (NITROSTAT) 0.4 MG SL tablet Place 1 tablet (0.4 mg total) under the tongue every 5 (five) minutes as needed for chest pain.   nystatin (MYCOSTATIN/NYSTOP) powder Apply 1 Application topically 3 (three) times daily.   ondansetron (ZOFRAN-ODT) 4 MG disintegrating tablet Take 1 tablet (4 mg total) by mouth every 8 (eight) hours as needed.   pantoprazole (PROTONIX) 20 MG tablet Take 1 tablet (20 mg total) by mouth in the morning.  polyethylene glycol (MIRALAX / GLYCOLAX) 17 g packet Take 17 g by mouth daily.   torsemide (DEMADEX) 20 MG tablet Take 1 tablet (20 mg total) by mouth daily.   traMADol (ULTRAM) 50 MG tablet Take 1 tablet (50 mg total) by mouth every 12 (twelve) hours as needed.     Allergies:   Bactrim [sulfamethoxazole-trimethoprim], Latex, Penicillin g, Atorvastatin, Other, Amlodipine, and Doxycycline   Social History   Socioeconomic History   Marital status: Single    Spouse name: Not on file   Number of children: 2   Years of education: 12   Highest education level: Not on file  Occupational History   Occupation: Disabled  Tobacco Use   Smoking status: Former    Current packs/day: 0.00    Types: Cigarettes    Quit date: 03/30/2014    Years since quitting: 8.6    Passive exposure: Past   Smokeless tobacco: Never  Vaping Use   Vaping status: Never Used  Substance and Sexual Activity   Alcohol use: No   Drug use: No   Sexual activity: Not Currently  Other Topics Concern   Not on file  Social History Narrative   Lives at home with her son's father.   Right-handed.   1 cup coffee per day.   Social Determinants of Health   Financial Resource Strain: Low Risk  (10/12/2022)    Overall Financial Resource Strain (CARDIA)    Difficulty of Paying Living Expenses: Not hard at all  Food Insecurity: No Food Insecurity (11/14/2022)   Hunger Vital Sign    Worried About Running Out of Food in the Last Year: Never true    Ran Out of Food in the Last Year: Never true  Recent Concern: Food Insecurity - Food Insecurity Present (10/12/2022)   Hunger Vital Sign    Worried About Running Out of Food in the Last Year: Never true    Ran Out of Food in the Last Year: Sometimes true  Transportation Needs: No Transportation Needs (11/14/2022)   PRAPARE - Administrator, Civil Service (Medical): No    Lack of Transportation (Non-Medical): No  Physical Activity: Insufficiently Active (10/12/2022)   Exercise Vital Sign    Days of Exercise per Week: 4 days    Minutes of Exercise per Session: 30 min  Stress: No Stress Concern Present (10/10/2021)   [employer] of Occupational Health - Occupational Stress Questionnaire    Feeling of Stress : Only a little  Social Connections: Socially Isolated (10/12/2022)   Social Connection and Isolation Panel [NHANES]    Frequency of Communication with Friends and Family: More than three times a week    Frequency of Social Gatherings with Friends and Family: Three times a week    Attends Religious Services: Never    Active Member of Clubs or Organizations: No    Attends Engineer, structural: Never    Marital Status: Divorced     Family History: The patient's family history includes Cancer in her paternal grandfather; Colon cancer in her father; Diabetes Mellitus II in her daughter; Heart attack in her mother; Heart failure in her son; Kidney disease in her son; Lung cancer in her mother; Multiple sclerosis in her daughter; Prostate cancer in her father; Stroke in her father and mother.  ROS:   Please see the history of present illness.     All other systems reviewed and are negative.  EKGs/Labs/Other Studies Reviewed:     The following studies were  reviewed today:  Echo 2021 1. Left ventricular ejection fraction, by estimation, is 50 to 55%. The  left ventricle has low normal function. The left ventricle has no regional  wall motion abnormalities. Left ventricular diastolic parameters are  consistent with Grade II diastolic  dysfunction (pseudonormalization).   2. Right ventricular systolic function is normal. The right ventricular  size is normal. Tricuspid regurgitation signal is inadequate for assessing  PA pressure.   3. Left atrial size was mildly dilated.   4. The mitral valve is degenerative. Mild mitral valve regurgitation. No  evidence of mitral stenosis.   5. The aortic valve is tricuspid. Aortic valve regurgitation is not  visualized. No aortic stenosis is present.   6. The inferior vena cava is normal in size with <50% respiratory  variability, suggesting right atrial pressure of 8 mmHg.    EKG:  EKG is ordered today.  The ekg ordered today demonstrates SB, 45bpm, 1st degree AV block, nonspecific T wave changes  Recent Labs: 05/03/2022: Magnesium 2.2 11/20/2022: ALT 41; BUN 46; Creatinine, Ser 2.42; Hemoglobin 12.3; Platelets 254; Potassium 5.1; Sodium 135; TSH 0.451  Recent Lipid Panel    Component Value Date/Time   CHOL 181 12/05/2021 1358   TRIG 53 04/05/2022 0642   HDL 73 12/05/2021 1358   CHOLHDL 2.5 12/05/2021 1358   VLDL 9 11/19/2016 1141   LDLCALC 87 12/05/2021 1358    Physical Exam:    VS:  BP (!) 151/83 (BP Location: Left Arm, Patient Position: Sitting, Cuff Size: Large)   Pulse (!) 47   Ht 5\' 1"  (1.549 m)   Wt 185 lb 9.6 oz (84.2 kg)   SpO2 99%   BMI 35.07 kg/m     Wt Readings from Last 3 Encounters:  11/21/22 185 lb 9.6 oz (84.2 kg)  11/20/22 188 lb (85.3 kg)  11/13/22 188 lb (85.3 kg)     GEN:  Well nourished, well developed in no acute distress HEENT: Normal NECK: No JVD; No carotid bruits LYMPHATICS: No lymphadenopathy CARDIAC: bradycardia, RR, no  murmurs, rubs, gallops RESPIRATORY:  Clear to auscultation without rales, wheezing or rhonchi  ABDOMEN: Soft, non-tender, non-distended MUSCULOSKELETAL:  No edema; No deformity  SKIN: Warm and dry NEUROLOGIC:  Alert and oriented x 3 PSYCHIATRIC:  Normal affect   ASSESSMENT:    1. Sinus bradycardia   2. Coronary artery disease involving native coronary artery of native heart with other form of angina pectoris (HCC)   3. Chronic diastolic heart failure (HCC)   4. Acute deep vein thrombosis (DVT) of left lower extremity, unspecified vein (HCC)   5. Essential hypertension   6. Hyperlipidemia, mixed    PLAN:    In order of problems listed above:  Sinus bradycardia Appears patient has low heart rate at baseline in the 50s with first-degree AV block.  More recently however, heart rate noted to be in the 40s and she was seen in the ER with benign work-up.  EKG today shows heart rate of 45 with first-degree AV block.  Patient is overall asymptomatic.  She is not on any rate lowering medication at baseline.  I will check a magnesium today. All other recent blood work unremarkable.  Upon ambulation in the office, heart rate went up to mid 60s.  I will order a live 2-week heart monitor.  I will also update an echocardiogram.  Patient may ultimately end up seeing EP.  Atypical chest pain CAD with remote stenting Patient reported atypical chest pain during her  recent hospitalization.  Troponins were negative.  Echo was ordered, but not performed.  I will update echo as above.  Patient denies any further chest pain since being out of the hospital.  Chronic HFpEF Patient appears euvolemic on exam.  Continue torsemide 20 mg daily.  Update echo as above.  DVT Patient recently diagnosed with DVT and started on Eliquis.  Hypertension Blood pressure is mildly elevated at 151/83.  Continue hydralazine 10 mg daily.  We will reassess this at follow-up.  Hyperlipidemia Lipid panel in 2023 showed HDL 73,  triglycerides 116, LDL 87, total cholesterol 161.  Continue Praluent injection  Disposition: Follow up in 4-6 week(s) with app   Signed, Rendell Thivierge David Stall, PA-C  11/21/2022 12:17 PM    [facility]

## 2022-11-21 NOTE — Patient Instructions (Signed)
Medication Instructions:  Your physician recommends that you continue on your current medications as directed. Please refer to the Current Medication list given to you today.  *If you need a refill on your cardiac medications before your next appointment, please call your pharmacy*   Lab Work: Your physician recommends that you get lab work today: Magnesium level Sales executive at [facility] 1st desk on the right to check in (REGISTRATION)  Lab hours: Monday- Friday (7:30 am- 5:30 pm)  If you have labs (blood work) drawn today and your tests are completely normal, you will receive your results only by: MyChart Message (if you have MyChart) OR A paper copy in the mail If you have any lab test that is abnormal or we need to change your treatment, we will call you to review the results.   Testing/Procedures: Your physician has requested that you have an echocardiogram. Echocardiography is a painless test that uses sound waves to create images of your heart. It provides your doctor with information about the size and shape of your heart and how well your heart's chambers and valves are working. This procedure takes approximately one hour. There are no restrictions for this procedure. Please do NOT wear cologne, perfume, aftershave, or lotions (deodorant is allowed). Please arrive 15 minutes prior to your appointment time.   Heart Monitor:  Your physician has requested you wear a ZIO AT monitor for 14 days.  Your monitor will be mailed to your home address within 3-5 business days. This is sent via [employer] from [employer]. However, if you have not received your monitor after 5 business days please send [place] a MyChart message or call the office at [phone], so we may follow up on this for you.   This monitor is a medical device (single patch monitor) that records the heart's electrical activity. Doctors most often use these monitors to diagnose arrhythmias. Arrhythmias are problems  with the speed or rhythm of the heartbeat.   [employer] supplies 1 patch per enrollment. Additional stickers are not available.  Please DO NOT apply the patch if you will be having a Nuclear Stress Test, Echocardiogram, Cardiac CT, Cardiac MRI, Chest X-ray during the period you would be wearing the monitor. The patch cannot be worn during these tests.  You cannot remove and re-apply the ZIO patch monitor.   Applying the Monitor: Once you receive your monitor, this will include a small razor, abrader, and 4 alcohol pads. Shave hair from upper left chest Rub abrader disc in 40 strokes over the left upper chest as indicated in your monitor instructions Clean area with 4 enclosed alcohol pads (there may be a mild & brief stinging sensation over the newly abraded area, but this is normal). Let dry Apply patch as indicated in monitor instructions. Patch will be placed under collarbone on the left side of the chest with arrow pointing upward. Rub adhesive wings for 2 minutes. Remove white label marked "1". Remove the white label marked "2". Rub patch adhesive wings for an 2 minutes.  While looking in a mirror, press and release button in the center of the patch. You may hear a "click". A small green light will flash 4-6 times and then stop. This will be your indicator that the monitor has been turned on.  Wearing the Monitor: Avoid showering during the first 24 hours of wearing the monitor.  After 24 hours you may shower with the patch on. Take brief showers with your back facing the  shower head.  Avoid excessive sweating to help maximize wear time. Do not submerge the device, no hot tubs, and no swimming pools. Keep any lotions or oils away from the patch. Press the button if you feel a symptom. You will hear a small click. Record date, time, and symptoms in the Patient Logbook or App.  Monitor Issues: Call [employer] Customer Care at [phone] if you have questions regarding your Zio  Patch Monitor. Call them immediately if you see an orange/ amber colored light blinking on your monitor. If your monitor falls off and you cannot get this reapplied or if you need suggestions for securing your monitor call [employer] at [phone].   Returning the Monitor: Once you have completed wearing your monitor, follow instructions on the last 2 pages of the Patient Logbook. Stick monitor patch on to the last page of the Patient Logbook.  Place Patient Logbook with monitor in the return box provided. Use locking tab on box and tape box closed securely. The return box has pre-paid postage on it.  Place the return box in the regular [place] Mail box as soon as possible It will take anywhere from 1-2 weeks for your provider to receive and review your results once you mail this back. If for some reason you have misplaced your return box then call our office and we can provide another box and/or mail it off for you.   Billing  and Patient Assistance Program Information: We have supplied [employer] with any of your insurance information on file for billing purposes. [employer] offers a sliding scale Patient Assistance Program for patients that do not have insurance, or whose insurance does not completely cover the cost of the ZIO monitor. You must apply for the Patient Assistance Program to qualify for this discounted rate. To apply, please call [employer] at [phone], select option 1, ask to apply for the Patient Assistance Program. [employer] will ask your household income, and how many people are in your household. They will quote your out-of-pocket cost based on that information. [employer] will also be able to set up for a 81-month, interest-free payment plan if needed.     Follow-Up: At [facility], you and your health needs are our priority.  As part of our continuing mission to provide you with exceptional heart care, we have created designated Provider Care Teams.  These Care Teams include  your primary Cardiologist (physician) and Advanced Practice Providers (APPs -  Physician Assistants and Nurse Practitioners) who all work together to provide you with the care you need, when you need it.  Your next appointment:   4 - 6 week(s)  Provider:   Terrilee Croak, PA-C    Other Instructions -YouTube search ZIO Patch Heart Monitor for step-by-step instructions for application

## 2022-11-22 ENCOUNTER — Telehealth: Payer: Self-pay

## 2022-11-22 ENCOUNTER — Telehealth: Payer: Self-pay | Admitting: Internal Medicine

## 2022-11-22 NOTE — Telephone Encounter (Signed)
Daughter made aware and verbalized understanding.   Furth, Cadence H, PA-C  You1 hour ago (1:44 PM)    Spoke with MD and he said to continue to monitor mag. He did not recommend anything else. Otherwise continue current work-up.

## 2022-11-22 NOTE — Telephone Encounter (Signed)
Furth, Cadence H, PA-C  Parke Poisson, RN Is she taking any daily vitamins os supplents? May have magnesium in them and she may need to stop these  Elevated mag may be contributing to her low heart rate.  Spoke with pt's daughter who stated pt is not taking any magnesium supplements and only taking the following medications. She also reported pt is not taking torsemide because she feels like it gave her mother a migraine.   Protnonix Keppra Eliquis Hydralazine Citalopram Zofran

## 2022-11-22 NOTE — Telephone Encounter (Signed)
Daughter- has concerns about her mother continuing to have UTI- she was treated in the hospital- but did not come home on anything. Patient is staring to have pressure. Patient has catheter and does not urinate. Urine is not dark as it was- but not clear. Appointment made to check urine.

## 2022-11-22 NOTE — Telephone Encounter (Signed)
Daughter Coy Saunas called saying her mom got out of the hospital Saturday/  She has a HFU.  She said she thinks her mom left the hospital with a UTI.   CB#  [phone].

## 2022-11-23 ENCOUNTER — Other Ambulatory Visit (HOSPITAL_COMMUNITY)
Admission: RE | Admit: 2022-11-23 | Discharge: 2022-11-23 | Disposition: A | Discharge status: Home / Self Care | Payer: Medicare Other | Source: Ambulatory Visit | Attending: Family Medicine | Admitting: Family Medicine

## 2022-11-23 ENCOUNTER — Ambulatory Visit (INDEPENDENT_AMBULATORY_CARE_PROVIDER_SITE_OTHER): Payer: Medicare Other | Admitting: Family Medicine

## 2022-11-23 ENCOUNTER — Encounter: Payer: Self-pay | Admitting: Family Medicine

## 2022-11-23 VITALS — BP 138/82 | HR 52 | Temp 97.7°F | Resp 18 | Ht 61.0 in | Wt 184.5 lb

## 2022-11-23 DIAGNOSIS — N760 Acute vaginitis: Secondary | ICD-10-CM | POA: Diagnosis not present

## 2022-11-23 DIAGNOSIS — R1030 Lower abdominal pain, unspecified: Secondary | ICD-10-CM

## 2022-11-23 DIAGNOSIS — R3989 Other symptoms and signs involving the genitourinary system: Secondary | ICD-10-CM | POA: Diagnosis not present

## 2022-11-23 DIAGNOSIS — Z960 Presence of urogenital implants: Secondary | ICD-10-CM | POA: Diagnosis not present

## 2022-11-23 DIAGNOSIS — Z09 Encounter for follow-up examination after completed treatment for conditions other than malignant neoplasm: Secondary | ICD-10-CM

## 2022-11-23 LAB — POCT URINALYSIS DIPSTICK
Bilirubin, UA: NEGATIVE
Blood, UA: POSITIVE
Glucose, UA: NEGATIVE
Ketones, UA: NEGATIVE
Nitrite, UA: POSITIVE
Odor: NEGATIVE
Protein, UA: NEGATIVE
Spec Grav, UA: 1.02 (ref 1.010–1.025)
Urobilinogen, UA: 0.2 E.U./dL
pH, UA: 6 (ref 5.0–8.0)

## 2022-11-23 MED ORDER — FLUCONAZOLE 150 MG PO TABS: 150.0000 mg | ORAL_TABLET | ORAL | 0 refills | Status: DC | PRN

## 2022-11-23 NOTE — Progress Notes (Signed)
Patient ID: Amberli Salt, female    DOB: [birth year], 72 y.o.   MRN: [id number]  PCP: [name], DO  Chief Complaint  Patient presents with   Urinary Tract Infection    Abdominal pain and pressure with urination, has catheter    Subjective:   Amariya Simer is a 72 y.o. female, presents to clinic with CC of the following:  HPI  Pt presents for abd discomfort, has suprapubic catheter which she manages with urology - does q 4 week SPT exchanges, last was done 7/15 She was admitted to the hospital on 7/17 - discharged on 7/20 with acute LLE DVT  Urine culture was negtive as was CT abd/pelvis  She got rocephin inpt not d/c on abx The urostomy site is near baseline, some sediment in urine bag, no blood or cloudiness, no flank pain, F, N, V  Results for orders placed or performed in visit on 11/23/22  POCT urinalysis dipstick  Result Value Ref Range   Color, UA yellow    Clarity, UA clear    Glucose, UA Negative Negative   Bilirubin, UA neg    Ketones, UA neg    Spec Grav, UA 1.020 1.010 - 1.025   Blood, UA positive    pH, UA 6.0 5.0 - 8.0   Protein, UA Negative Negative   Urobilinogen, UA 0.2 0.2 or 1.0 E.U./dL   Nitrite, UA positive    Leukocytes, UA Moderate (2+) (A) Negative   Appearance clear    Odor neg       Patient Active Problem List   Diagnosis Date Noted   DVT (deep venous thrombosis) (HCC) 11/14/2022   Thigh pain 11/13/2022   Bilateral lower extremity edema 05/03/2022   Recurrent UTI 04/29/2022   Seizure disorder (HCC) 04/29/2022   GERD without esophagitis 04/29/2022   Hypernatremia 03/16/2022   Small bowel obstruction (HCC) 03/14/2022   Cerebrovascular accident (CVA) (HCC) 02/15/2022   Gait abnormality 02/15/2022   Urinary retention 02/15/2022   OSA (obstructive sleep apnea) 02/15/2022   History of CVA (cerebrovascular accident) 08/24/2021   Moderate episode of recurrent major depressive disorder (HCC) 06/28/2021   Statin myopathy  03/09/2020   Lymphedema 03/09/2020   Partial symptomatic epilepsy with complex partial seizures, not intractable, without status epilepticus (HCC) 02/18/2020   Bad dreams 02/18/2020   Chronic post-traumatic stress disorder (PTSD) 02/18/2020   Snoring 02/18/2020   Coronary artery disease 02/18/2020   Stroke (HCC)    Debility 07/07/2019   Hyperkalemia 07/03/2019   AKI (acute kidney injury) (HCC) 07/02/2019   Anemia of chronic disease 07/02/2019   Bradycardia 07/02/2019   Acute CHF (congestive heart failure) (HCC) 07/02/2019   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   History of diabetes mellitus 01/22/2019   Osteopenia 01/24/2018   Anxiety 12/03/2017   Neutropenia (HCC) 12/31/2015   Seizures (HCC)    Carotid stenosis    PVC's (premature ventricular contractions)    Controlled type 2 diabetes mellitus without complication, without long-term current use of insulin (HCC)    CKD (chronic kidney disease) stage 4, GFR 15-29 ml/min (HCC) 08/19/2014   Morbid obesity (HCC) 03/04/2006   Essential hypertension 03/04/2006   GERD 03/04/2006   History of gastric ulcer 03/04/2006      Current Outpatient Medications:    aluminum-magnesium hydroxide-simethicone (MAALOX) 200-200-20 MG/5ML SUSP, Take 30 mLs by mouth 4 (four) times daily -  before meals and at bedtime., Disp: 355 mL, Rfl: 0   apixaban (ELIQUIS) 5 MG TABS tablet, Take 1 tablet (  5 mg total) by mouth 2 (two) times daily. Please take this prescription once the eliquis starter pack has been completed., Disp: 60 tablet, Rfl: 4   APIXABAN (ELIQUIS) VTE STARTER PACK (10MG  AND 5MG ), Take as directed on package: start with two-5mg  tablets twice daily for 7 days. On day 8 (11/22/2022), switch to one-5mg  tablet twice daily., Disp: 74 each, Rfl: 0   benzonatate (TESSALON) 100 MG capsule, Take 1 capsule (100 mg total) by mouth 2 (two) times daily as needed for cough., Disp: 20 capsule, Rfl: 0   Cholecalciferol (VITAMIN D3) 25 MCG (1000 UT) CAPS, Take 1  capsule by mouth daily., Disp: , Rfl:    citalopram (CELEXA) 20 MG tablet, TAKE 1 TABLET BY MOUTH ONCE A DAY, Disp: 90 tablet, Rfl: 0   feeding supplement (ENSURE ENLIVE / ENSURE PLUS) LIQD, Take 237 mLs by mouth 2 (two) times daily between meals., Disp: 237 mL, Rfl: 12   ferrous sulfate 325 (65 FE) MG tablet, Take 325 mg by mouth daily with breakfast., Disp: , Rfl:    fluticasone (FLONASE) 50 MCG/ACT nasal spray, Place 2 sprays into both nostrils daily as needed for allergies (allergies). , Disp: , Rfl:    guaiFENesin (MUCINEX) 600 MG 12 hr tablet, Take 1 tablet (600 mg total) by mouth daily., Disp: 30 tablet, Rfl: 0   hydrALAZINE (APRESOLINE) 10 MG tablet, Take 10 mg by mouth daily., Disp: , Rfl:    levETIRAcetam (KEPPRA) 500 MG tablet, Take 1 tablet (500 mg total) by mouth 2 (two) times daily., Disp: 60 tablet, Rfl: 3   loratadine (CLARITIN) 10 MG tablet, TAKE 1 TABLET BY MOUTH ONCE A DAY AS NEEDED FOR ALLERGIES, Disp: 30 tablet, Rfl: 3   metoCLOPramide (REGLAN) 10 MG tablet, Take 1 tablet (10 mg total) by mouth every 6 (six) hours as needed., Disp: 30 tablet, Rfl: 0   Multiple Vitamin (MULTIVITAMIN WITH MINERALS) TABS tablet, Take 1 tablet by mouth daily., Disp: 30 tablet, Rfl: 1   nitroGLYCERIN (NITROSTAT) 0.4 MG SL tablet, Place 1 tablet (0.4 mg total) under the tongue every 5 (five) minutes as needed for chest pain., Disp: 30 tablet, Rfl: 12   nystatin (MYCOSTATIN/NYSTOP) powder, Apply 1 Application topically 3 (three) times daily., Disp: 60 g, Rfl: 1   ondansetron (ZOFRAN-ODT) 4 MG disintegrating tablet, Take 1 tablet (4 mg total) by mouth every 8 (eight) hours as needed., Disp: 20 tablet, Rfl: 0   pantoprazole (PROTONIX) 20 MG tablet, Take 1 tablet (20 mg total) by mouth in the morning., Disp: 90 tablet, Rfl: 0   polyethylene glycol (MIRALAX / GLYCOLAX) 17 g packet, Take 17 g by mouth daily., Disp: 14 each, Rfl: 0   torsemide (DEMADEX) 20 MG tablet, Take 1 tablet (20 mg total) by mouth  daily., Disp: 90 tablet, Rfl: 3   traMADol (ULTRAM) 50 MG tablet, Take 1 tablet (50 mg total) by mouth every 12 (twelve) hours as needed., Disp: 15 tablet, Rfl: 0   Allergies  Allergen Reactions   Bactrim [Sulfamethoxazole-Trimethoprim] Other (See Comments)    Hematuria, dysuria   Latex Rash   Penicillin G Itching   Atorvastatin Other (See Comments)    Myalgias    Other Hives    Adhesive tape   Amlodipine Swelling    Leg swelling   Doxycycline     Blood in urine     Social History   Tobacco Use   Smoking status: Former    Current packs/day: 0.00    Types: Cigarettes  Quit date: 03/30/2014    Years since quitting: 8.6    Passive exposure: Past   Smokeless tobacco: Never  Vaping Use   Vaping status: Never Used  Substance Use Topics   Alcohol use: No   Drug use: No      Chart Review Today: I personally reviewed active problem list, medication list, allergies, family history, social history, health maintenance, notes from last encounter, lab results, imaging with the patient/caregiver today.   Review of Systems  Constitutional: Negative.   HENT: Negative.    Eyes: Negative.   Respiratory: Negative.    Cardiovascular: Negative.   Gastrointestinal: Negative.   Endocrine: Negative.   Genitourinary: Negative.   Musculoskeletal: Negative.   Skin: Negative.   Allergic/Immunologic: Negative.   Neurological: Negative.   Hematological: Negative.   Psychiatric/Behavioral: Negative.    All other systems reviewed and are negative.      Objective:   Vitals:   11/23/22 1133  BP: 138/82  Pulse: (!) 52  Resp: 18  Temp: 97.7 F (36.5 C)  SpO2: 98%  Weight: 184 lb 8 oz (83.7 kg)  Height: 5\' 1"  (1.549 m)    Body mass index is 34.86 kg/m.  Physical Exam Vitals and nursing note reviewed.  Constitutional:      Appearance: She is well-developed.  HENT:     Head: Normocephalic and atraumatic.     Nose: Nose normal.  Eyes:     General:        Right eye: No  discharge.        Left eye: No discharge.     Conjunctiva/sclera: Conjunctivae normal.  Neck:     Trachea: No tracheal deviation.  Cardiovascular:     Rate and Rhythm: Normal rate and regular rhythm.  Pulmonary:     Effort: Pulmonary effort is normal. No respiratory distress.     Breath sounds: No stridor.  Abdominal:     General: Bowel sounds are normal. There is no distension.     Palpations: Abdomen is soft.     Tenderness: There is no abdominal tenderness.     Comments: Suprapubic urostomy, no surrounding erythema or drainage  Musculoskeletal:        General: Normal range of motion.  Skin:    General: Skin is warm and dry.     Findings: No rash.  Neurological:     Mental Status: She is alert.     Motor: No abnormal muscle tone.     Coordination: Coordination normal.  Psychiatric:        Behavior: Behavior normal.      Results for orders placed or performed during the hospital encounter of 11/21/22  Magnesium  Result Value Ref Range   Magnesium 2.8 (H) 1.7 - 2.4 mg/dL       Assessment & Plan:   1. Lower abdominal pain Improved - CT scan from ED neg, no ttp on exam - POCT urinalysis dipstick - Urine Culture  2. Sensation of pressure in bladder area Will do urine testing, expect colonization, pt will need to follow with urology - POCT urinalysis dipstick - Urine Culture  3. Indwelling urinary catheter present Trouble with some sx since last slightly delayed switch out of foley  4. Encounter for examination following treatment at hospital Reviewed encounter labs, imaging results extensively  5. Vulvovaginitis Main bothersome sx is vulvovaginal irritation, she feels like she is sitting on something sharp Swab and tx She can try OTC external or internal yeast infection meds - Cervicovaginal  ancillary only      Danelle Berry, PA-C 11/23/22 11:36 AM

## 2022-11-27 DIAGNOSIS — I5032 Chronic diastolic (congestive) heart failure: Secondary | ICD-10-CM

## 2022-11-27 DIAGNOSIS — I25118 Atherosclerotic heart disease of native coronary artery with other forms of angina pectoris: Secondary | ICD-10-CM

## 2022-11-27 DIAGNOSIS — R001 Bradycardia, unspecified: Secondary | ICD-10-CM

## 2022-11-28 DIAGNOSIS — I25118 Atherosclerotic heart disease of native coronary artery with other forms of angina pectoris: Secondary | ICD-10-CM | POA: Diagnosis not present

## 2022-11-28 DIAGNOSIS — I5032 Chronic diastolic (congestive) heart failure: Secondary | ICD-10-CM | POA: Diagnosis not present

## 2022-11-29 NOTE — Progress Notes (Signed)
Established Patient Office Visit  Subjective:  Patient ID: Leslie Houston, female    DOB: [birth year]  Age: 72 y.o. MRN: [id number]  CC:  Chief Complaint  Patient presents with   Follow-up    HPI Leslie Houston presents for ER and hospital follow up.  She is here with her daughter today.  Home health nurse noticed the patient had low heart rates in the 40's.  In the ER EKG was consistent with first-degree AV block with heart rates in the 40s to 50s.  She is mainly asymptomatic.  She is currently wearing a ZIO monitor has been evaluated by cardiology on 11/21/2022.  She was also hospitalized and discharged 05/15/2022 for left lower extremity DVT.  She was started on Eliquis 10 mg twice daily for 7 days, which the patient states caused severe headaches.  Now that she is on the lower dose of 5 mg twice daily her headaches have improved.  She is having some blurred vision here today as well does have a prescription for eyeglasses but it is several years old.  Hypertension/CHF: -Medications: Hydralazine 10 twice daily, Torsemide 20 mg -Patient is compliant with above medications and reports no side effects. -Denies any SOB, CP, vision changes, LE edema or symptoms of hypotension -Following with Cardiology, Dr. Mariah Milling, last seen 06/23/2022 -Currently has ZIO monitor, planning for echo in the next several weeks as well  Hx of CVA/CAD: -Currently on Zetia and aspirin, statin intolerance history  Lipid Panel     Component Value Date/Time   CHOL 181 12/05/2021 1358   TRIG 53 04/05/2022 0642   HDL 73 12/05/2021 1358   CHOLHDL 2.5 12/05/2021 1358   VLDL 9 11/19/2016 1141   LDLCALC 87 12/05/2021 1358   CKD4: -Last creatinine 7/24 2.42 -Following with Nephrology, last seen 05/10/22   Seizure Disorder: -Keppra decreased to 500 mg twice daily due to renal function -Following with Neurology  GERD: -Currently on Protonix 20 mg, symptoms well controlled.  -Avoiding trigger foods and laying  on 3 pillows at night  -Denies nausea, vomiting or abdominal pain.  Weight stable appetite good  MDD: -Mood status: stable -Current treatment: not taking, had been on Celexa 20 mg (increased at LOV) but patient's daughter uncomfortable with the medication      11/30/2022   11:00 AM 11/23/2022   11:36 AM 10/12/2022    3:05 PM 07/02/2022    2:19 PM 05/09/2022   11:12 AM  Depression screen PHQ 2/9  Decreased Interest 0 0 0 0 0  Down, Depressed, Hopeless 0 0 0 0 1  PHQ - 2 Score 0 0 0 0 1  Altered sleeping 0 0  0 0  Tired, decreased energy 0 0  0 0  Change in appetite 0 0  0 0  Feeling bad or failure about yourself  0 0  0 0  Trouble concentrating 0 0  0 0  Moving slowly or fidgety/restless 0 0  0 0  Suicidal thoughts 0 0  0 0  PHQ-9 Score 0 0  0 1  Difficult doing work/chores Not difficult at all Not difficult at all   Not difficult at all   Controlled diabetes, Type 2: -Last A1c 5.8% 8/23 -Medications: Nothing  Health Maintenance: -Blood work UTD -Mammogram due   Past Medical History:  Diagnosis Date   (HFpEF) heart failure with preserved ejection fraction (HCC)    a. Pt reports in 2000 she had CHF, details unclear, outside hospital; b. 06/2019 Echo: EF 50-55%,  no rwma, Gr2 DD,  mildly dil LA, mild MR.   Acute left PCA stroke (HCC) 06/22/2014   Anxiety    a.) on BZO (alprazolam) PRN   CAD (coronary artery disease)    a. Pt reports in 2000 she had a stent to a coronary artery, details unclear, outside hospital.   Carotid stenosis    a. CT angio head 07/2014 - 50-60% stenoses of prox ICA bilaterally.   CKD (chronic kidney disease), stage IV (HCC)    Depression    Diabetes mellitus (HCC)    Denies.   Edentulous    No upper teeth   Family hx of colon cancer 06/13/2018   Father diagnosed in his 70's   Gout    History of cataract extraction, left 03/21/2021   HOH (hard of hearing)    usually needs to read lips   Hyperlipidemia    Hypertension    Intellectual disability     a.) born premature, limited formal education, has never driven   Myalgia due to statin    Orthostatic hypotension    OSA (obstructive sleep apnea)    a.) not on nocturnal PAP therapy; old/nonfuntioning DME - referred for repeat PSG 01/2022 by neurology   Osteopenia 01/24/2018   DEXA Sept 2019   PONV (postoperative nausea and vomiting)    PTSD (post-traumatic stress disorder)        PUD (peptic ulcer disease)    PVC's (premature ventricular contractions)    Right sided weakness 05/2014   a.) s/p CVA   Seizures (HCC)    Sinus bradycardia    Syncope    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)     Past Surgical History:  Procedure Laterality Date   BLADDER SURGERY     CATARACT EXTRACTION W/PHACO Left 03/21/2021   Procedure: CATARACT EXTRACTION PHACO AND INTRAOCULAR LENS PLACEMENT (IOC) left 5.86 00:38.0;  Surgeon: Galen Manila, MD;  Location: [facility];  Service: Ophthalmology;  Laterality: Left;  Latex   CESAREAN SECTION     CORONARY ANGIOPLASTY WITH STENT PLACEMENT     CYSTOSCOPY N/A 03/12/2022   Procedure: CYSTOSCOPY;  Surgeon: Vanna Scotland, MD;  Location: [facility];  Service: Urology;  Laterality: N/A;   DENTAL SURGERY     INSERTION OF SUPRAPUBIC CATHETER N/A 03/12/2022   Procedure: INSERTION OF SUPRAPUBIC CATHETER;  Surgeon: Vanna Scotland, MD;  Location: [facility];  Service: Urology;  Laterality: N/A;   LAPAROTOMY N/A 03/22/2022   Procedure: EXPLORATORY LAPAROTOMY; LYSIS OF ADHESIONS;  Surgeon: Sung Amabile, DO;  Location: [facility];  Service: General;  Laterality: N/A;   RECTAL EXAM UNDER ANESTHESIA N/A 03/12/2022   Procedure: PELVIC EXAM UNDER ANESTHESIA;  Surgeon: Vanna Scotland, MD;  Location: [facility];  Service: Urology;  Laterality: N/A;    Family History  Problem Relation Age of Onset   Stroke Mother    Heart attack Mother    Lung cancer Mother    Stroke Father    Prostate cancer Father    Colon cancer Father    Diabetes Mellitus  II Daughter    Multiple sclerosis Daughter    Kidney disease Son    Heart failure Son    Cancer Paternal Grandfather     Social History   Socioeconomic History   Marital status: Single    Spouse name: Not on file   Number of children: 2   Years of education: 12   Highest education level: Not on file  Occupational History   Occupation:  Disabled  Tobacco Use   Smoking status: Former    Current packs/day: 0.00    Types: Cigarettes    Quit date: 03/30/2014    Years since quitting: 8.6    Passive exposure: Past   Smokeless tobacco: Never  Vaping Use   Vaping status: Never Used  Substance and Sexual Activity   Alcohol use: No   Drug use: No   Sexual activity: Not Currently  Other Topics Concern   Not on file  Social History Narrative   Lives at home with her son's father.   Right-handed.   1 cup coffee per day.   Social Determinants of Health   Financial Resource Strain: Low Risk  (10/12/2022)   Overall Financial Resource Strain (CARDIA)    Difficulty of Paying Living Expenses: Not hard at all  Food Insecurity: No Food Insecurity (11/14/2022)   Hunger Vital Sign    Worried About Running Out of Food in the Last Year: Never true    Ran Out of Food in the Last Year: Never true  Recent Concern: Food Insecurity - Food Insecurity Present (10/12/2022)   Hunger Vital Sign    Worried About Running Out of Food in the Last Year: Never true    Ran Out of Food in the Last Year: Sometimes true  Transportation Needs: No Transportation Needs (11/14/2022)   PRAPARE - Administrator, Civil Service (Medical): No    Lack of Transportation (Non-Medical): No  Physical Activity: Insufficiently Active (10/12/2022)   Exercise Vital Sign    Days of Exercise per Week: 4 days    Minutes of Exercise per Session: 30 min  Stress: No Stress Concern Present (10/10/2021)   [employer] of Occupational Health - Occupational Stress Questionnaire    Feeling of Stress : Only a little   Social Connections: Socially Isolated (10/12/2022)   Social Connection and Isolation Panel [NHANES]    Frequency of Communication with Friends and Family: More than three times a week    Frequency of Social Gatherings with Friends and Family: Three times a week    Attends Religious Services: Never    Active Member of Clubs or Organizations: No    Attends Banker Meetings: Never    Marital Status: Divorced  Catering manager Violence: Not At Risk (11/14/2022)   Humiliation, Afraid, Rape, and Kick questionnaire    Fear of Current or Ex-Partner: No    Emotionally Abused: No    Physically Abused: No    Sexually Abused: No    Outpatient Medications Prior to Visit  Medication Sig Dispense Refill   aluminum-magnesium hydroxide-simethicone (MAALOX) 200-200-20 MG/5ML SUSP Take 30 mLs by mouth 4 (four) times daily -  before meals and at bedtime. 355 mL 0   apixaban (ELIQUIS) 5 MG TABS tablet Take 1 tablet (5 mg total) by mouth 2 (two) times daily. Please take this prescription once the eliquis starter pack has been completed. 60 tablet 4   APIXABAN (ELIQUIS) VTE STARTER PACK (10MG  AND 5MG ) Take as directed on package: start with two-5mg  tablets twice daily for 7 days. On day 8 (11/22/2022), switch to one-5mg  tablet twice daily. 74 each 0   benzonatate (TESSALON) 100 MG capsule Take 1 capsule (100 mg total) by mouth 2 (two) times daily as needed for cough. 20 capsule 0   Cholecalciferol (VITAMIN D3) 25 MCG (1000 UT) CAPS Take 1 capsule by mouth daily.     citalopram (CELEXA) 20 MG tablet TAKE 1 TABLET BY MOUTH ONCE  A DAY 90 tablet 0   feeding supplement (ENSURE ENLIVE / ENSURE PLUS) LIQD Take 237 mLs by mouth 2 (two) times daily between meals. 237 mL 12   ferrous sulfate 325 (65 FE) MG tablet Take 325 mg by mouth daily with breakfast.     fluconazole (DIFLUCAN) 150 MG tablet Take 1 tablet (150 mg total) by mouth every 3 (three) days as needed (for vaginal itching/yeast infection sx). 2  tablet 0   fluticasone (FLONASE) 50 MCG/ACT nasal spray Place 2 sprays into both nostrils daily as needed for allergies (allergies).      guaiFENesin (MUCINEX) 600 MG 12 hr tablet Take 1 tablet (600 mg total) by mouth daily. 30 tablet 0   hydrALAZINE (APRESOLINE) 10 MG tablet Take 10 mg by mouth daily.     levETIRAcetam (KEPPRA) 500 MG tablet Take 1 tablet (500 mg total) by mouth 2 (two) times daily. 60 tablet 3   loratadine (CLARITIN) 10 MG tablet TAKE 1 TABLET BY MOUTH ONCE A DAY AS NEEDED FOR ALLERGIES 30 tablet 3   metoCLOPramide (REGLAN) 10 MG tablet Take 1 tablet (10 mg total) by mouth every 6 (six) hours as needed. 30 tablet 0   Multiple Vitamin (MULTIVITAMIN WITH MINERALS) TABS tablet Take 1 tablet by mouth daily. 30 tablet 1   nitroGLYCERIN (NITROSTAT) 0.4 MG SL tablet Place 1 tablet (0.4 mg total) under the tongue every 5 (five) minutes as needed for chest pain. 30 tablet 12   nystatin (MYCOSTATIN/NYSTOP) powder Apply 1 Application topically 3 (three) times daily. 60 g 1   ondansetron (ZOFRAN-ODT) 4 MG disintegrating tablet Take 1 tablet (4 mg total) by mouth every 8 (eight) hours as needed. 20 tablet 0   pantoprazole (PROTONIX) 20 MG tablet Take 1 tablet (20 mg total) by mouth in the morning. 90 tablet 0   polyethylene glycol (MIRALAX / GLYCOLAX) 17 g packet Take 17 g by mouth daily. 14 each 0   torsemide (DEMADEX) 20 MG tablet Take 1 tablet (20 mg total) by mouth daily. 90 tablet 3   traMADol (ULTRAM) 50 MG tablet Take 1 tablet (50 mg total) by mouth every 12 (twelve) hours as needed. 15 tablet 0   No facility-administered medications prior to visit.    Allergies  Allergen Reactions   Bactrim [Sulfamethoxazole-Trimethoprim] Other (See Comments)    Hematuria, dysuria   Latex Rash   Penicillin G Itching   Atorvastatin Other (See Comments)    Myalgias    Other Hives    Adhesive tape   Amlodipine Swelling    Leg swelling   Doxycycline     Blood in urine    ROS Review of  Systems  Constitutional:  Negative for chills and fever.  Respiratory:  Negative for cough and shortness of breath.   Cardiovascular:  Negative for chest pain, palpitations and leg swelling.  Gastrointestinal:  Negative for blood in stool.  Genitourinary:  Negative for hematuria.      Objective:    Physical Exam Constitutional:      Appearance: Normal appearance.  HENT:     Head: Normocephalic and atraumatic.  Eyes:     Conjunctiva/sclera: Conjunctivae normal.  Cardiovascular:     Rate and Rhythm: Normal rate and regular rhythm.  Pulmonary:     Effort: Pulmonary effort is normal.     Breath sounds: Normal breath sounds.  Skin:    General: Skin is warm and dry.  Neurological:     General: No focal deficit present.  Mental Status: She is alert. Mental status is at baseline.  Psychiatric:        Mood and Affect: Mood normal.        Behavior: Behavior normal.     BP 116/62   Pulse (!) 50   Temp (!) 97.5 F (36.4 C)   Resp 18   Ht 5\' 1"  (1.549 m)   Wt 187 lb (84.8 kg)   SpO2 100%   BMI 35.33 kg/m  Wt Readings from Last 3 Encounters:  11/30/22 187 lb (84.8 kg)  11/23/22 184 lb 8 oz (83.7 kg)  11/21/22 185 lb 9.6 oz (84.2 kg)     Health Maintenance Due  Topic Date Due   Fecal DNA (Cologuard)  Never done   MAMMOGRAM  02/13/2019   FOOT EXAM  01/22/2020   OPHTHALMOLOGY EXAM  03/03/2021   HEMOGLOBIN A1C  06/07/2022   INFLUENZA VACCINE  11/29/2022    There are no preventive care reminders to display for this patient.  Lab Results  Component Value Date   TSH 0.451 11/20/2022   Lab Results  Component Value Date   WBC 4.4 11/20/2022   HGB 12.3 11/20/2022   HCT 37.3 11/20/2022   MCV 99.2 11/20/2022   PLT 254 11/20/2022   Lab Results  Component Value Date   NA 135 11/20/2022   K 5.1 11/20/2022   CO2 19 (L) 11/20/2022   GLUCOSE 86 11/20/2022   BUN 46 (H) 11/20/2022   CREATININE 2.42 (H) 11/20/2022   BILITOT 1.0 11/20/2022   ALKPHOS 84 11/20/2022    AST 37 11/20/2022   ALT 41 11/20/2022   PROT 7.3 11/20/2022   ALBUMIN 3.4 (L) 11/20/2022   CALCIUM 8.8 (L) 11/20/2022   ANIONGAP 7 11/20/2022   EGFR 18 (L) 05/03/2022   Lab Results  Component Value Date   CHOL 181 12/05/2021   Lab Results  Component Value Date   HDL 73 12/05/2021   Lab Results  Component Value Date   LDLCALC 87 12/05/2021   Lab Results  Component Value Date   TRIG 53 04/05/2022   Lab Results  Component Value Date   CHOLHDL 2.5 12/05/2021   Lab Results  Component Value Date   HGBA1C 4.7 12/05/2021      Assessment & Plan:   1. Hospital discharge follow-up/Deep vein thrombosis (DVT) of popliteal vein of left lower extremity, unspecified chronicity [facility]): Hospital discharge summary from 11/17/2022 reviewed along with labs and imaging.  Patient doing better with Eliquis now that she is on 5 mg twice daily, plan for now to continue for another 3 months until follow-up.  2. Bradycardia: Following with cardiology, note reviewed from 11/21/2022.  Patient is currently wearing a ZIO monitor.  She does have first-degree AV block on EKG, asymptomatic.  Will continue to follow along.  3. Controlled type 2 diabetes mellitus without complication, without long-term current use of insulin (HCC): Having some new blurred vision, however she has not had an eye exam in several years.  Referral back to ophthalmology placed.  A1c here good at 4.7, diabetes continues to be well-controlled.  - Ambulatory referral to Ophthalmology - POCT HgB A1C  4. Encounter for screening mammogram for malignant neoplasm of breast: Mammogram due but patient can schedule after cardiac testing is complete.   - MM 3D SCREENING MAMMOGRAM BILATERAL BREAST; Future   Follow-up: Return in about 3 months (around 03/02/2023).    [name], DO

## 2022-11-30 ENCOUNTER — Telehealth: Payer: Self-pay | Admitting: Medical

## 2022-11-30 ENCOUNTER — Ambulatory Visit (INDEPENDENT_AMBULATORY_CARE_PROVIDER_SITE_OTHER): Payer: Medicare Other | Admitting: Internal Medicine

## 2022-11-30 ENCOUNTER — Encounter: Payer: Self-pay | Admitting: Internal Medicine

## 2022-11-30 VITALS — BP 116/62 | HR 50 | Temp 97.5°F | Resp 18 | Ht 61.0 in | Wt 187.0 lb

## 2022-11-30 DIAGNOSIS — Z09 Encounter for follow-up examination after completed treatment for conditions other than malignant neoplasm: Secondary | ICD-10-CM

## 2022-11-30 DIAGNOSIS — E119 Type 2 diabetes mellitus without complications: Secondary | ICD-10-CM

## 2022-11-30 DIAGNOSIS — Z1231 Encounter for screening mammogram for malignant neoplasm of breast: Secondary | ICD-10-CM

## 2022-11-30 DIAGNOSIS — R001 Bradycardia, unspecified: Secondary | ICD-10-CM

## 2022-11-30 DIAGNOSIS — I82432 Acute embolism and thrombosis of left popliteal vein: Secondary | ICD-10-CM

## 2022-11-30 LAB — POCT GLYCOSYLATED HEMOGLOBIN (HGB A1C): Hemoglobin A1C: 4.8 % (ref 4.0–5.6)

## 2022-11-30 MED ORDER — METRONIDAZOLE 0.75 % VA GEL: 1.0000 | Freq: Every day | VAGINAL | 0 refills | Status: AC

## 2022-11-30 MED ORDER — FLUCONAZOLE 150 MG PO TABS: 150.0000 mg | ORAL_TABLET | ORAL | 0 refills | Status: DC | PRN

## 2022-11-30 NOTE — Chronic Care Management (AMB) (Signed)
     Leslie Houston [birth year] [id number]   Reason for Encounter: Patient is not currently enrolled in the CCM program. CCM enrollment status changed to Previously enrolled.   [name]/Chronic Care Management [phone]

## 2022-11-30 NOTE — Telephone Encounter (Signed)
Cardiology On-Call  Notified by [employer] of 90 seconds of Afib seen on monitor with HR in 40s.  First documentation AF. Pt is on eliquis.  Precious Reel, MD , [facility] 11/30/22 2:20 AM

## 2022-12-03 ENCOUNTER — Telehealth: Payer: Self-pay | Admitting: Cardiovascular Disease

## 2022-12-03 ENCOUNTER — Other Ambulatory Visit: Payer: Self-pay

## 2022-12-03 DIAGNOSIS — R001 Bradycardia, unspecified: Secondary | ICD-10-CM

## 2022-12-03 DIAGNOSIS — I493 Ventricular premature depolarization: Secondary | ICD-10-CM

## 2022-12-03 NOTE — Telephone Encounter (Signed)
Abnormal EKG, call transferred to nurse

## 2022-12-13 ENCOUNTER — Ambulatory Visit (INDEPENDENT_AMBULATORY_CARE_PROVIDER_SITE_OTHER): Payer: Medicare Other | Admitting: Physician Assistant

## 2022-12-13 VITALS — BP 164/71 | HR 57

## 2022-12-13 DIAGNOSIS — Z435 Encounter for attention to cystostomy: Secondary | ICD-10-CM

## 2022-12-13 DIAGNOSIS — R339 Retention of urine, unspecified: Secondary | ICD-10-CM

## 2022-12-13 DIAGNOSIS — B3731 Acute candidiasis of vulva and vagina: Secondary | ICD-10-CM

## 2022-12-13 NOTE — Progress Notes (Signed)
Suprapubic Cath Change  Patient is present today for a suprapubic catheter change due to urinary retention.  8ml of water was drained from the balloon, a 16FR Silastic foley cath was removed from the tract without difficulty.  Site was cleaned and prepped in a sterile fashion with betadine.  A 16FR Silastic foley cath was replaced into the tract no complications were noted. Urine return was noted, 10 ml of sterile water was inflated into the balloon and a leg bag was attached for drainage.  Patient tolerated well.    Performed by: Carman Ching, PA-C   Additional notes: She was treated for VVC per PCP with fluconazole x1 dose last week. Vaginal itching, discharge, and bleeding have resolved. She has no acute concerns today.  Follow up: Return in about 3 weeks (around 01/03/2023) for SPT exchange.

## 2022-12-18 ENCOUNTER — Telehealth: Payer: Self-pay | Admitting: Internal Medicine

## 2022-12-18 NOTE — Telephone Encounter (Signed)
Copied from CRM [id number]. Topic: General - Other >> Dec 18, 2022 12:57 PM Dondra Prader A wrote: Reason for CRM: Reuel Boom with [facility] states that they faxed over a pt verification form that is needing to be filled out by pt PCP and sent back to them. Please advise.  Fax number: [phone] phone number: [phone]

## 2022-12-19 ENCOUNTER — Ambulatory Visit: Payer: Medicare Other

## 2022-12-19 NOTE — Telephone Encounter (Signed)
Elijah from [facility] called to report that they are missing office visit notes, please advise.   Best contact: [phone]

## 2022-12-19 NOTE — Telephone Encounter (Signed)
Faxed back.

## 2022-12-20 NOTE — Telephone Encounter (Signed)
Faxed last office notes

## 2022-12-24 ENCOUNTER — Telehealth: Payer: Self-pay | Admitting: Cardiovascular Disease

## 2022-12-24 NOTE — Telephone Encounter (Signed)
Leslie Houston with [employer] calling for zio monitor results

## 2022-12-24 NOTE — Telephone Encounter (Signed)
[employer] called and stated they needed to make correction on the strips- stating that they notified of AFIB twice on the strips, however; they noted it was sinus rhythm, it was not afib. However patient does have upcoming appointment with EP to clarify- we will keep upcoming appointment to clear patient monitor results.   Thanks!

## 2023-01-02 ENCOUNTER — Ambulatory Visit: Payer: Medicare Other | Attending: Cardiology | Admitting: Cardiology

## 2023-01-02 ENCOUNTER — Encounter: Payer: Self-pay | Admitting: Cardiology

## 2023-01-02 VITALS — BP 140/70 | HR 68 | Ht 61.0 in | Wt 193.2 lb

## 2023-01-02 DIAGNOSIS — I5032 Chronic diastolic (congestive) heart failure: Secondary | ICD-10-CM | POA: Diagnosis not present

## 2023-01-02 DIAGNOSIS — I25118 Atherosclerotic heart disease of native coronary artery with other forms of angina pectoris: Secondary | ICD-10-CM | POA: Diagnosis not present

## 2023-01-02 DIAGNOSIS — R001 Bradycardia, unspecified: Secondary | ICD-10-CM | POA: Diagnosis not present

## 2023-01-02 DIAGNOSIS — G40909 Epilepsy, unspecified, not intractable, without status epilepticus: Secondary | ICD-10-CM | POA: Diagnosis not present

## 2023-01-02 DIAGNOSIS — I130 Hypertensive heart and chronic kidney disease with heart failure and stage 1 through stage 4 chronic kidney disease, or unspecified chronic kidney disease: Secondary | ICD-10-CM | POA: Diagnosis not present

## 2023-01-02 DIAGNOSIS — N184 Chronic kidney disease, stage 4 (severe): Secondary | ICD-10-CM | POA: Diagnosis not present

## 2023-01-02 DIAGNOSIS — N390 Urinary tract infection, site not specified: Secondary | ICD-10-CM | POA: Diagnosis not present

## 2023-01-02 DIAGNOSIS — Z6835 Body mass index (BMI) 35.0-35.9, adult: Secondary | ICD-10-CM | POA: Diagnosis not present

## 2023-01-02 DIAGNOSIS — E1122 Type 2 diabetes mellitus with diabetic chronic kidney disease: Secondary | ICD-10-CM | POA: Diagnosis not present

## 2023-01-02 DIAGNOSIS — I82442 Acute embolism and thrombosis of left tibial vein: Secondary | ICD-10-CM | POA: Diagnosis not present

## 2023-01-02 DIAGNOSIS — I700 Atherosclerosis of aorta: Secondary | ICD-10-CM | POA: Diagnosis not present

## 2023-01-02 DIAGNOSIS — I69351 Hemiplegia and hemiparesis following cerebral infarction affecting right dominant side: Secondary | ICD-10-CM | POA: Diagnosis not present

## 2023-01-02 NOTE — Progress Notes (Addendum)
Electrophysiology Office Note:    Date:  01/02/2023   ID:  Leslie Houston, DOB 1950/08/05, MRN [id number]  [facility] Cardiologist:  Julien Nordmann, MD  [facility] Electrophysiologist:  Lanier Prude, MD   Referring MD: Marianne Sofia, PA-C   Chief Complaint: Bradycardia  History of Present Illness:    Leslie Houston is a 72 y.o. femalewho I am seeing today for an evaluation of bradycardia at the request of Cadence [place], [place].  The patient was last seen by Cadence on November 21, 2022.  The patient has a medical history that includes chronic diastolic heart failure, coronary artery disease with prior PCI, carotid artery disease, hypertension, hyperlipidemia, CKD stage IV, CVA and anemia.  The patient was hospitalized July 16 through 20 with a urinary tract infection and DVT.  She was on Eliquis for DVT.  She was seen in the ER on November 21, 2018 for with heart rates in the 40s.  EKG in the emergency department showed sinus bradycardia with ventricular to 47 bpm.  At the appoint with cadence she denied any presyncope or syncope.  With activity her heart rates increase appropriately into the 60s at least.  She is with her daughter today in clinic.  She reports no presyncope or syncope.  There was 1 episode of what sounds like orthostatic intolerance in the past.  She was bending over and then stood up and then ended up getting dizzy and falling.  She has experienced headaches while on the higher dose Eliquis starter pack.  Her grandfather had a permanent pacemaker     Their past medical, social and family history was reveiwed.   ROS:   Please see the history of present illness.    All other systems reviewed and are negative.  EKGs/Labs/Other Studies Reviewed:    The following studies were reviewed today:  July 03, 2019 echo EF 50 to 55% RV normal Dilated left atrium Mild MR  ZIO monitor personally reviewed Wear time July 30 through August 13 Average heart rate  49 bpm Rare supraventricular and ventricular ectopy Some day/night heart rate variability but minimal Significant artifact throughout the tracings but P waves are clearly visible No clear atrial fibrillation or flutter   EKG on November 21, 2022 shows sinus bradycardia with a first-degree AV delay (PR interval 290 ms)      Physical Exam:    VS:  BP (!) 140/70 (BP Location: Left Arm, Patient Position: Sitting, Cuff Size: Normal)   Pulse 68   Ht 5\' 1"  (1.549 m)   Wt 193 lb 4 oz (87.7 kg)   SpO2 98%   BMI 36.51 kg/m     Heart rate increased from 45 at rest to 60 bpm with exertion around the clinic.  Wt Readings from Last 3 Encounters:  01/02/23 193 lb 4 oz (87.7 kg)  11/30/22 187 lb (84.8 kg)  11/23/22 184 lb 8 oz (83.7 kg)     GEN:  Well nourished, well developed in no acute distress.  Obese CARDIAC: Regular rhythm, bradycardic, no murmurs, rubs, gallops RESPIRATORY:  Clear to auscultation without rales, wheezing or rhonchi       ASSESSMENT AND PLAN:    1. Sinus bradycardia   2. Chronic diastolic heart failure (HCC)   3. Coronary artery disease involving native coronary artery of native heart with other form of angina pectoris (HCC)     #Sinus bradycardia #First-degree AV delay No evidence of AV block.  No syncope or presyncope.  Appropriate chronotropic  response to minimal exertion in the office.  No intervention warranted at this time. We discussed red flag symptoms that should warrant prompt ER evaluation including syncope or presyncope. We discussed the possibility that she would require permanent pacing as she ages. [name]  #Coronary artery disease No ischemic symptoms today.  #DVT Continue Eliquis  #Chronic diastolic heart failure NYHA class II today.  Euvolemic on exam.  Continue current medical therapy including torsemide.  Follow-up with EP on an as-needed basis.      Signed, Rossie Muskrat. Lalla Brothers, MD, [facility], [facility] 01/02/2023 2:55 PM     Electrophysiology [facility]

## 2023-01-02 NOTE — Patient Instructions (Signed)
 Medication Instructions:  The current medical regimen is effective;  continue present plan and medications.  *If you need a refill on your cardiac medications before your next appointment, please call your pharmacy*   Follow-Up: At [facility], you and your health needs are our priority.  As part of our continuing mission to provide you with exceptional heart care, we have created designated Provider Care Teams.  These Care Teams include your primary Cardiologist (physician) and Advanced Practice Providers (APPs -  Physician Assistants and Nurse Practitioners) who all work together to provide you with the care you need, when you need it.  We recommend signing up for the patient portal called "MyChart".  Sign up information is provided on this After Visit Summary.  MyChart is used to connect with patients for Virtual Visits (Telemedicine).  Patients are able to view lab/test results, encounter notes, upcoming appointments, etc.  Non-urgent messages can be sent to your provider as well.   To learn more about what you can do with MyChart, go to [email or web address].    Your next appointment:   As needed  Provider:   Steffanie Dunn, MD

## 2023-01-03 ENCOUNTER — Ambulatory Visit (INDEPENDENT_AMBULATORY_CARE_PROVIDER_SITE_OTHER): Payer: Medicare Other | Admitting: Physician Assistant

## 2023-01-03 VITALS — BP 182/79 | HR 49 | Ht 61.0 in | Wt 192.4 lb

## 2023-01-03 DIAGNOSIS — R339 Retention of urine, unspecified: Secondary | ICD-10-CM | POA: Diagnosis not present

## 2023-01-03 DIAGNOSIS — Z435 Encounter for attention to cystostomy: Secondary | ICD-10-CM

## 2023-01-03 NOTE — Progress Notes (Signed)
Suprapubic Cath Change  Patient is present today for a suprapubic catheter change due to urinary retention.  8ml of water was drained from the balloon, a 16FR Silastic foley cath was removed from the tract without difficulty.  Site was cleaned and prepped in a sterile fashion with betadine.  A 16FR coude foley cath was replaced into the tract no complications were noted. Urine return was noted, 10 ml of sterile water was inflated into the balloon and catheter was plugged.  Patient tolerated well.     Performed by: Carman Ching, PA-C   Additional notes: They want to try plugging the SPT during the daytime and allowing it to freely drain overnight. We discussed unplugging it every 2 hours to allow it to drain or after spontaneous voids to determine her residual.  Follow up: Return in about 4 weeks (around 01/31/2023) for SPT exchange.

## 2023-01-10 ENCOUNTER — Ambulatory Visit: Payer: Medicare Other | Attending: Medical

## 2023-01-10 ENCOUNTER — Telehealth: Payer: Self-pay

## 2023-01-10 DIAGNOSIS — I25118 Atherosclerotic heart disease of native coronary artery with other forms of angina pectoris: Secondary | ICD-10-CM | POA: Insufficient documentation

## 2023-01-10 DIAGNOSIS — I5032 Chronic diastolic (congestive) heart failure: Secondary | ICD-10-CM | POA: Insufficient documentation

## 2023-01-10 LAB — ECHOCARDIOGRAM COMPLETE
AR max vel: 2.32 cm2
AV Peak grad: 9 mmHg
Ao pk vel: 1.5 m/s
Area-P 1/2: 3.99 cm2
Calc EF: 61.7 %
S' Lateral: 3.8 cm
Single Plane A2C EF: 56.4 %
Single Plane A4C EF: 64.1 %

## 2023-01-10 NOTE — Telephone Encounter (Signed)
Patient and patients daughter stopped me at check wanting recommendations on her medication.   Daughter stated that patient was on torsemide 20 me daily. Dr. Mariah Milling had stopped it for a period of time and then started her back on the same medication.   Daughter stated this new bottle of Torsemide made pstient very sick and she had to take her to the hospital.   Daughter stated that the pills look complete different from the previous refill.   I did make her aware that if they come from a different manufacture then the pill can look different.   Patient does have noticeable swelling in her ankles and would like to see if they can try a different medication.

## 2023-01-11 NOTE — Telephone Encounter (Signed)
Called patient. There was no answer and unable to leave voice mail.

## 2023-01-14 NOTE — Telephone Encounter (Signed)
Attempted to call patient but no answer and unable to leave voice mail at this time.

## 2023-01-16 ENCOUNTER — Ambulatory Visit: Payer: Medicare Other | Admitting: Physician Assistant

## 2023-01-16 NOTE — Telephone Encounter (Signed)
Called patient and unable to leave message

## 2023-01-18 ENCOUNTER — Ambulatory Visit: Payer: Medicare Other | Attending: Medical | Admitting: Medical

## 2023-01-18 ENCOUNTER — Encounter: Payer: Self-pay | Admitting: Medical

## 2023-01-18 VITALS — BP 148/60 | HR 48 | Ht 61.0 in | Wt 188.5 lb

## 2023-01-18 DIAGNOSIS — R001 Bradycardia, unspecified: Secondary | ICD-10-CM

## 2023-01-18 DIAGNOSIS — I10 Essential (primary) hypertension: Secondary | ICD-10-CM | POA: Diagnosis not present

## 2023-01-18 DIAGNOSIS — I5032 Chronic diastolic (congestive) heart failure: Secondary | ICD-10-CM

## 2023-01-18 DIAGNOSIS — I493 Ventricular premature depolarization: Secondary | ICD-10-CM | POA: Diagnosis not present

## 2023-01-18 MED ORDER — FUROSEMIDE 40 MG PO TABS: 40.0000 mg | ORAL_TABLET | Freq: Every day | ORAL | 3 refills | Status: DC

## 2023-01-18 NOTE — Patient Instructions (Signed)
Medication Instructions:  Your physician recommends the following medication changes.  START TAKING: Lasix 40 mg by mouth daily  *If you need a refill on your cardiac medications before your next appointment, please call your pharmacy*   Lab Work: Your provider would like for you to return in 2 weeks to have the following labs drawn: (BMP).   Please go to [facility] [address] ([facility] [address], [place] [address] You do not need an appointment.  They are open from 7:30 am-4 pm.  Lunch from 1:00 pm- 2:00 pm You will not need to be fasting.   Testing/Procedures: No test ordered today    Follow-Up: At [facility], you and your health needs are our priority.  As part of our continuing mission to provide you with exceptional heart care, we have created designated Provider Care Teams.  These Care Teams include your primary Cardiologist (physician) and Advanced Practice Providers (APPs -  Physician Assistants and Nurse Practitioners) who all work together to provide you with the care you need, when you need it.  We recommend signing up for the patient portal called "MyChart".  Sign up information is provided on this After Visit Summary.  MyChart is used to connect with patients for Virtual Visits (Telemedicine).  Patients are able to view lab/test results, encounter notes, upcoming appointments, etc.  Non-urgent messages can be sent to your provider as well.   To learn more about what you can do with MyChart, go to [email or web address].    Your next appointment:   2 month(s)  Provider:   You may see Julien Nordmann, MD or one of the following Advanced Practice Providers on your designated Care Team:   Nicolasa Ducking, NP Eula Listen, PA-C Cadence Fransico Michael, PA-C Charlsie Quest, NP

## 2023-01-18 NOTE — Telephone Encounter (Signed)
Patient seen in clinic 01/18/23  with Cadence Fransico Michael, PA-C.

## 2023-01-18 NOTE — Progress Notes (Unsigned)
Cardiology Office Note:    Date:  01/18/2023   ID:  Leslie Houston, DOB [birth year], MRN [id number]  PCP:  Leslie Mail, DO  [facility] Cardiologist:  Julien Nordmann, MD  [facility] Electrophysiologist:  Lanier Prude, MD   Referring MD: Leslie Mail, DO   Chief Complaint: 2 month follow-up  History of Present Illness:    Leslie Houston is a 72 y.o. female with a hx of HFpEF, CAD s/p remote stenting, carotid artery stenosis, hypertension, hyperlipidemia, CKD stage IV, CVA, and anemia who presents for follow-up.   Patient underwent coronary stenting in 2000 although details inclear. March 2021 she was admitted with HFpEF and worsening renal function and noted to be anemic. Echo showed LVEF 50-55%, no significant valvular abnormalities, G2DD. Sinus pause noted on telemetry however this was felt to be vagal in etiology and self resolved.    The patient was hospitalized 7/16-7/20 for UTI and DVT. She was started on Eliquis therapy for DVT. Patient also reported atypical chest pain in the hospital, troponins negative. Echo was ordered, but not performed.   The patient was subsequently seen in the ER 11/21/22 for bradycardia. It was noted patient had a heart rate in the 40s and it was recommended she go to there ER. Baseline heart rate low in the 50s. Patient denied any symptoms. In the ER heart rate 45bpm. EKG showed SB 47bpm with prolonged PRI. Labs were unremarkable. She was discharged home.  She was seen 11/21/2022 with sinus bradycardia.  EKG showed heart rate of 45 with first-degree AV block.  Patient is overall asymptomatic.  Magnesium was checked.  Echo showed LVEF 60 to 65%, grade 1 diastolic dysfunction, mild MR.  Heart monitor was not.  Patient saw EP who recommended monitoring symptoms.   Today, the patient reports she is overall feeling OK. Patient's blood pressure is mildly elevated. She walked here which may be why it's high. She denies chest pain, SOB,  lightheadedness or dizziness. Says she has a headache. She drinks enough water. Caregiver said she got a new rx of Torsemide and this caused her to go to the hospital, she thought she had a reaction to the new type of torsemide pill. She reports lower leg edema since torsemide was stopped.   Past Medical History:  Diagnosis Date   (HFpEF) heart failure with preserved ejection fraction (HCC)    a. Pt reports in 2000 she had CHF, details unclear, outside hospital; b. 06/2019 Echo: EF 50-55%, no rwma, Gr2 DD,  mildly dil LA, mild MR.   Acute left PCA stroke (HCC) 06/22/2014   Anxiety    a.) on BZO (alprazolam) PRN   CAD (coronary artery disease)    a. Pt reports in 2000 she had a stent to a coronary artery, details unclear, outside hospital.   Carotid stenosis    a. CT angio head 07/2014 - 50-60% stenoses of prox ICA bilaterally.   CKD (chronic kidney disease), stage IV (HCC)    Depression    Diabetes mellitus (HCC)    Denies.   Edentulous    No upper teeth   Family hx of colon cancer 06/13/2018   Father diagnosed in his 48's   Gout    History of cataract extraction, left 03/21/2021   HOH (hard of hearing)    usually needs to read lips   Hyperlipidemia    Hypertension    Intellectual disability    a.) born premature, limited formal education, has never driven   Myalgia due  to statin    Orthostatic hypotension    OSA (obstructive sleep apnea)    a.) not on nocturnal PAP therapy; old/nonfuntioning DME - referred for repeat PSG 01/2022 by neurology   Osteopenia 01/24/2018   DEXA Sept 2019   PONV (postoperative nausea and vomiting)    PTSD (post-traumatic stress disorder)        PUD (peptic ulcer disease)    PVC's (premature ventricular contractions)    Right sided weakness 05/2014   a.) s/p CVA   Seizures (HCC)    Sinus bradycardia    Syncope    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)     Past Surgical History:  Procedure Laterality Date   BLADDER SURGERY      CATARACT EXTRACTION W/PHACO Left 03/21/2021   Procedure: CATARACT EXTRACTION PHACO AND INTRAOCULAR LENS PLACEMENT (IOC) left 5.86 00:38.0;  Surgeon: Galen Manila, MD;  [facility]: [facility];  Service: Ophthalmology;  Laterality: Left;  Latex   CESAREAN SECTION     CORONARY ANGIOPLASTY WITH STENT PLACEMENT     CYSTOSCOPY N/A 03/12/2022   Procedure: CYSTOSCOPY;  Surgeon: Vanna Scotland, MD;  Location: [facility];  Service: Urology;  Laterality: N/A;   DENTAL SURGERY     INSERTION OF SUPRAPUBIC CATHETER N/A 03/12/2022   Procedure: INSERTION OF SUPRAPUBIC CATHETER;  Surgeon: Vanna Scotland, MD;  Location: [facility];  Service: Urology;  Laterality: N/A;   LAPAROTOMY N/A 03/22/2022   Procedure: EXPLORATORY LAPAROTOMY; LYSIS OF ADHESIONS;  Surgeon: Sung Amabile, DO;  Location: [facility];  Service: General;  Laterality: N/A;   RECTAL EXAM UNDER ANESTHESIA N/A 03/12/2022   Procedure: PELVIC EXAM UNDER ANESTHESIA;  Surgeon: Vanna Scotland, MD;  Location: [facility];  Service: Urology;  Laterality: N/A;    Current Medications: Current Meds  Medication Sig   aluminum-magnesium hydroxide-simethicone (MAALOX) 200-200-20 MG/5ML SUSP Take 30 mLs by mouth 4 (four) times daily -  before meals and at bedtime.   apixaban (ELIQUIS) 5 MG TABS tablet Take 1 tablet (5 mg total) by mouth 2 (two) times daily. Please take this prescription once the eliquis starter pack has been completed.   benzonatate (TESSALON) 100 MG capsule Take 1 capsule (100 mg total) by mouth 2 (two) times daily as needed for cough.   Cholecalciferol (VITAMIN D3) 25 MCG (1000 UT) CAPS Take 1 capsule by mouth daily.   citalopram (CELEXA) 20 MG tablet TAKE 1 TABLET BY MOUTH ONCE A DAY   feeding supplement (ENSURE ENLIVE / ENSURE PLUS) LIQD Take 237 mLs by mouth 2 (two) times daily between meals.   ferrous sulfate 325 (65 FE) MG tablet Take 325 mg by mouth daily with breakfast.   fluconazole (DIFLUCAN) 150 MG tablet Take 1 tablet (150  mg total) by mouth every 3 (three) days as needed (for vaginal itching/yeast infection sx).   fluticasone (FLONASE) 50 MCG/ACT nasal spray Place 2 sprays into both nostrils daily as needed for allergies (allergies).   furosemide (LASIX) 40 MG tablet Take 1 tablet (40 mg total) by mouth daily.   guaiFENesin (MUCINEX) 600 MG 12 hr tablet Take 1 tablet (600 mg total) by mouth daily.   hydrALAZINE (APRESOLINE) 10 MG tablet Take 10 mg by mouth 3 (three) times daily.   levETIRAcetam (KEPPRA) 500 MG tablet Take 1 tablet (500 mg total) by mouth 2 (two) times daily.   loratadine (CLARITIN) 10 MG tablet TAKE 1 TABLET BY MOUTH ONCE A DAY AS NEEDED FOR ALLERGIES   metoCLOPramide (REGLAN) 10 MG tablet  Take 1 tablet (10 mg total) by mouth every 6 (six) hours as needed.   Multiple Vitamin (MULTIVITAMIN WITH MINERALS) TABS tablet Take 1 tablet by mouth daily.   nitroGLYCERIN (NITROSTAT) 0.4 MG SL tablet Place 1 tablet (0.4 mg total) under the tongue every 5 (five) minutes as needed for chest pain.   nystatin (MYCOSTATIN/NYSTOP) powder Apply 1 Application topically 3 (three) times daily.   ondansetron (ZOFRAN-ODT) 4 MG disintegrating tablet Take 1 tablet (4 mg total) by mouth every 8 (eight) hours as needed.   pantoprazole (PROTONIX) 20 MG tablet Take 1 tablet (20 mg total) by mouth in the morning.   polyethylene glycol (MIRALAX / GLYCOLAX) 17 g packet Take 17 g by mouth daily.   traMADol (ULTRAM) 50 MG tablet Take 1 tablet (50 mg total) by mouth every 12 (twelve) hours as needed.     Allergies:   Bactrim [sulfamethoxazole-trimethoprim], Latex, Penicillin g, Atorvastatin, Other, Amlodipine, and Doxycycline   Social History   Socioeconomic History   Marital status: Single    Spouse name: Not on file   Number of children: 2   Years of education: 12   Highest education level: Not on file  Occupational History   Occupation: Disabled  Tobacco Use   Smoking status: Former    Current packs/day: 0.00     Types: Cigarettes    Quit date: 03/30/2014    Years since quitting: 8.8    Passive exposure: Past   Smokeless tobacco: Never  Vaping Use   Vaping status: Never Used  Substance and Sexual Activity   Alcohol use: No   Drug use: No   Sexual activity: Not Currently  Other Topics Concern   Not on file  Social History Narrative   Lives at home with her son's father.   Right-handed.   1 cup coffee per day.   Social Determinants of Health   Financial Resource Strain: Low Risk  (10/12/2022)   Overall Financial Resource Strain (CARDIA)    Difficulty of Paying Living Expenses: Not hard at all  Food Insecurity: No Food Insecurity (11/14/2022)   Hunger Vital Sign    Worried About Running Out of Food in the Last Year: Never true    Ran Out of Food in the Last Year: Never true  Recent Concern: Food Insecurity - Food Insecurity Present (10/12/2022)   Hunger Vital Sign    Worried About Running Out of Food in the Last Year: Never true    Ran Out of Food in the Last Year: Sometimes true  Transportation Needs: No Transportation Needs (11/14/2022)   PRAPARE - Administrator, Civil Service (Medical): No    Lack of Transportation (Non-Medical): No  Physical Activity: Insufficiently Active (10/12/2022)   Exercise Vital Sign    Days of Exercise per Week: 4 days    Minutes of Exercise per Session: 30 min  Stress: No Stress Concern Present (10/10/2021)   [employer] of Occupational Health - Occupational Stress Questionnaire    Feeling of Stress : Only a little  Social Connections: Socially Isolated (10/12/2022)   Social Connection and Isolation Panel [NHANES]    Frequency of Communication with Friends and Family: More than three times a week    Frequency of Social Gatherings with Friends and Family: Three times a week    Attends Religious Services: Never    Active Member of Clubs or Organizations: No    Attends Banker Meetings: Never    Marital Status: Divorced  Family History: The patient's family history includes Cancer in her paternal grandfather; Colon cancer in her father; Diabetes Mellitus II in her daughter; Heart attack in her mother; Heart failure in her son; Kidney disease in her son; Lung cancer in her mother; Multiple sclerosis in her daughter; Prostate cancer in her father; Stroke in her father and mother.  ROS:   Please see the history of present illness.     All other systems reviewed and are negative.  EKGs/Labs/Other Studies Reviewed:    The following studies were reviewed today:  Echo 12/2022  1. Left ventricular ejection fraction, by estimation, is 60 to 65%. Left  ventricular ejection fraction by 2D MOD biplane is 61.7 %. The left  ventricle has normal function. The left ventricle has no regional wall  motion abnormalities. Left ventricular  diastolic parameters are consistent with Grade I diastolic dysfunction  (impaired relaxation). The average left ventricular global longitudinal  strain is -15.5 %.   2. Right ventricular systolic function is normal. The right ventricular  size is normal.   3. The mitral valve is normal in structure. Mild mitral valve  regurgitation. No evidence of mitral stenosis.   4. The aortic valve has an indeterminant number of cusps. Aortic valve  regurgitation is not visualized. No aortic stenosis is present.   5. The inferior vena cava is normal in size with greater than 50%  respiratory variability, suggesting right atrial pressure of 3 mmHg.   6. Heart rate in the 40s   Comparison(s): Previous Echo showed LV EF 50-55%, Grade II diastolic  dysfunction, and a degenerative MV.     EKG:  EKG is ordered today.  The ekg ordered today demonstrates SB, 48bpm, first degree AV block, 48bpm, nonspecific T wave changes  Recent Labs: 11/20/2022: ALT 41; BUN 46; Creatinine, Ser 2.42; Hemoglobin 12.3; Platelets 254; Potassium 5.1; Sodium 135; TSH 0.451 11/21/2022: Magnesium 2.8  Recent Lipid Panel     Component Value Date/Time   CHOL 181 12/05/2021 1358   TRIG 53 04/05/2022 0642   HDL 73 12/05/2021 1358   CHOLHDL 2.5 12/05/2021 1358   VLDL 9 11/19/2016 1141   LDLCALC 87 12/05/2021 1358    Physical Exam:    VS:  BP (!) 148/60 (BP Location: Left Arm, Patient Position: Sitting, Cuff Size: Normal)   Pulse (!) 48   Ht 5\' 1"  (1.549 m)   Wt 188 lb 8 oz (85.5 kg)   SpO2 96%   BMI 35.62 kg/m     Wt Readings from Last 3 Encounters:  01/18/23 188 lb 8 oz (85.5 kg)  01/03/23 192 lb 6 oz (87.3 kg)  01/02/23 193 lb 4 oz (87.7 kg)     GEN:  Well nourished, well developed in no acute distress HEENT: Normal NECK: No JVD; No carotid bruits LYMPHATICS: No lymphadenopathy CARDIAC: RRR, no murmurs, rubs, gallops RESPIRATORY:  Clear to auscultation without rales, wheezing or rhonchi  ABDOMEN: Soft, non-tender, non-distended MUSCULOSKELETAL:  No edema; No deformity  SKIN: Warm and dry NEUROLOGIC:  Alert and oriented x 3 PSYCHIATRIC:  Normal affect   ASSESSMENT:    1. Chronic diastolic heart failure (HCC)   2. Sinus bradycardia   3. PVC's (premature ventricular contractions)   4. Essential hypertension    PLAN:    In order of problems listed above:  Sinus bradycardia Patient has low baseline HR in the 50s with first degree AV block with occasional dips into the 40s.  Patient is overall asymptomatic.  She is  not on any rate lowering medications.  Patient saw EP who recommended monitoring blood pressure and symptoms.  CAD with remote stenting Patient denies any chest pain or shortness of breath. No further ischemic work-up indicated. No ASA with Eliquis.   Chronic HFpEF Patient's caregiver reports she got a new and different bottle of torsemide, which caused her to have a reaction and ended up in the hospital.  Patient has not been taking torsemide.  I will start Lasix 40 mg daily. BMET in 2 weeks.   DVT Continue Eliquis 5 mg twice daily.  HTN Blood pressure is mildly  elevated today.  She takes hydralazine 10 mg 3 times daily.  May have to increase this at follow-up with blood pressure still high.  HLD LDL 87.  Continue Praluent injection.  Can update lipids at follow-up.  Disposition: Follow up in 2 month(s) with MD/APP    Signed, Toshiye Kever David Stall, PA-C  01/18/2023 2:57 PM    [facility]

## 2023-01-24 ENCOUNTER — Telehealth: Payer: Self-pay | Admitting: Internal Medicine

## 2023-01-24 NOTE — Telephone Encounter (Signed)
[employer] is calling to ask if Della Goo wrote a script for the patient for a back brace CB- [phone]

## 2023-01-31 ENCOUNTER — Ambulatory Visit: Payer: Medicare Other | Admitting: Physician Assistant

## 2023-01-31 ENCOUNTER — Other Ambulatory Visit: Payer: Self-pay | Admitting: Internal Medicine

## 2023-01-31 DIAGNOSIS — F331 Major depressive disorder, recurrent, moderate: Secondary | ICD-10-CM

## 2023-01-31 NOTE — Telephone Encounter (Signed)
Requested by interface surescripts. Future visit in 1 month.  Requested Prescriptions  Pending Prescriptions Disp Refills   citalopram (CELEXA) 20 MG tablet [Pharmacy Med Name: CITALOPRAM HYDROBROMIDE 20MG  TABLET] 90 tablet 0    Sig: TAKE ONE TABLET BY MOUTH ONCE A DAY     Psychiatry:  Antidepressants - SSRI Passed - 01/31/2023 10:43 AM      Passed - Completed PHQ-2 or PHQ-9 in the last 360 days      Passed - Valid encounter within last 6 months    Recent Outpatient Visits           2 months ago Hospital discharge follow-up   [facility] [name], DO   2 months ago Lower abdominal pain   [facility] Danelle Berry, PA-C   7 months ago Acute cough   [facility] [name], DO   8 months ago Hospital discharge follow-up   [facility] [name], DO   9 months ago Bilateral lower extremity edema   [facility] Berniece Salines, FNP       Future Appointments             In 4 days Carman Ching, PA-C [facility] [place]   In 1 month [name], DO [facility]   In 1 month Gollan, Tollie Pizza, MD [facility]

## 2023-02-04 ENCOUNTER — Ambulatory Visit (INDEPENDENT_AMBULATORY_CARE_PROVIDER_SITE_OTHER): Payer: Medicare Other | Admitting: Physician Assistant

## 2023-02-04 VITALS — BP 137/78 | HR 46 | Ht 61.0 in | Wt 189.1 lb

## 2023-02-04 DIAGNOSIS — Z435 Encounter for attention to cystostomy: Secondary | ICD-10-CM

## 2023-02-04 DIAGNOSIS — R339 Retention of urine, unspecified: Secondary | ICD-10-CM | POA: Diagnosis not present

## 2023-02-04 NOTE — Progress Notes (Signed)
Suprapubic Cath Change  Patient is present today for a suprapubic catheter change due to urinary retention.  8ml of water was drained from the balloon, a 16FR Silastic foley cath was removed from the tract with out difficulty.  Site was cleaned and prepped in a sterile fashion with betadine.  A 16FR Silastic foley cath was replaced into the tract no complications were noted. Urine return was noted, 10 ml of sterile water was inflated into the balloon and a night bag was attached for drainage.  Patient tolerated well.     Performed by: Carman Ching, PA-C   Follow up: Return in about 4 weeks (around 03/04/2023) for SPT exchange.

## 2023-02-06 ENCOUNTER — Telehealth: Payer: Self-pay | Admitting: Internal Medicine

## 2023-02-06 NOTE — Telephone Encounter (Signed)
We do not send rx's for back brace pt would need top see ortho

## 2023-02-06 NOTE — Telephone Encounter (Signed)
Copied from CRM [id number]. Topic: General - Other >> Feb 06, 2023 11:32 AM Franchot Heidelberg wrote: Reason for CRM: Joycelyn from [employer], called for prescription verification for a back brace  Best contact: [phone]

## 2023-02-08 ENCOUNTER — Emergency Department
Admission: EM | Admit: 2023-02-08 | Discharge: 2023-02-08 | Disposition: A | Discharge status: Home / Self Care | Payer: Medicare Other | Attending: Emergency Medicine | Admitting: Emergency Medicine

## 2023-02-08 ENCOUNTER — Other Ambulatory Visit: Payer: Self-pay

## 2023-02-08 DIAGNOSIS — I509 Heart failure, unspecified: Secondary | ICD-10-CM | POA: Diagnosis not present

## 2023-02-08 DIAGNOSIS — R1030 Lower abdominal pain, unspecified: Secondary | ICD-10-CM | POA: Insufficient documentation

## 2023-02-08 DIAGNOSIS — R102 Pelvic and perineal pain: Secondary | ICD-10-CM

## 2023-02-08 DIAGNOSIS — R319 Hematuria, unspecified: Secondary | ICD-10-CM | POA: Diagnosis not present

## 2023-02-08 DIAGNOSIS — I110 Hypertensive heart disease with heart failure: Secondary | ICD-10-CM | POA: Diagnosis not present

## 2023-02-08 LAB — WET PREP, GENITAL
Clue Cells Wet Prep HPF POC: NONE SEEN
Sperm: NONE SEEN
Trich, Wet Prep: NONE SEEN
WBC, Wet Prep HPF POC: <10 (ref <10)
Yeast Wet Prep HPF POC: NONE SEEN

## 2023-02-08 LAB — URINALYSIS, ROUTINE W REFLEX MICROSCOPIC
Bilirubin Urine: NEGATIVE
Glucose, UA: NEGATIVE mg/dL
Ketones, ur: NEGATIVE mg/dL
Nitrite: NEGATIVE
Protein, ur: 30 mg/dL — AB
RBC / HPF: >50 RBC/hpf (ref 0–5)
Specific Gravity, Urine: 1.009 (ref 1.005–1.030)
WBC, UA: >50 WBC/hpf (ref 0–5)
pH: 6 (ref 5.0–8.0)

## 2023-02-08 NOTE — ED Triage Notes (Signed)
Pt to ED for pain to urethra after catheter changed Monday. Reports blood in catheter bag. No blood noted in catheter bag Increased pain with movement

## 2023-02-08 NOTE — Discharge Instructions (Addendum)
Follow-up with [facility] for ongoing care.

## 2023-02-08 NOTE — ED Provider Notes (Signed)
[facility] Emergency Department Provider Note     Event Date/Time   First MD Initiated Contact with Patient 02/08/23 1950     (approximate)   History   Hematuria   HPI  Leslie Houston is a 72 y.o. female with a history of HTN, morbid obesity, CHF, AKI, and urinary retention with suprapubic cath, presents to the ED for concern over gross hematuria.  Patient is being followed by [facility] urology, and had a suprapubic catheter changed on Monday in the office.  She presents to the ED today, reporting evidence of blood in the catheter bag.  Patient also reports some irritation at the vaginal introitus, that she describes as a sharp burning sensation.  She believes she feels the suprapubic catheter poking her internally.   Physical Exam   Triage Vital Signs: ED Triage Vitals  Encounter Vitals Group     BP 02/08/23 1847 (!) 162/57     Systolic BP Percentile --      Diastolic BP Percentile --      Pulse Rate 02/08/23 1847 (!) 55     Resp 02/08/23 1847 18     Temp 02/08/23 1847 97.9 F (36.6 C)     Temp src --      SpO2 02/08/23 1847 98 %     Weight 02/08/23 1848 187 lb 6.3 oz (85 kg)     Height 02/08/23 1848 5\' 1"  (1.549 m)     Head Circumference --      Peak Flow --      Pain Score 02/08/23 1847 0     Pain Loc --      Pain Education --      Exclude from Growth Chart --     Most recent vital signs: Vitals:   02/08/23 1847  BP: (!) 162/57  Pulse: (!) 55  Resp: 18  Temp: 97.9 F (36.6 C)  SpO2: 98%    General Awake, no distress. NAD CV:  Good peripheral perfusion.  RESP:  Normal effort.  ABD:  No distention.  Suprapubic catheter is adjusted/pulled approximately 3 cm from the stoma GU:  Normal external genitalia.  Normal vagina and cervix.  Wet prep collected.   ED Results / Procedures / Treatments   Labs (all labs ordered are listed, but only abnormal results are displayed) Labs Reviewed  URINALYSIS, ROUTINE W REFLEX MICROSCOPIC  - Abnormal; Notable for the following components:      Result Value   Color, Urine YELLOW (*)    APPearance CLOUDY (*)    Hgb urine dipstick MODERATE (*)    Protein, ur 30 (*)    Leukocytes,Ua LARGE (*)    Bacteria, UA RARE (*)    All other components within normal limits  WET PREP, GENITAL     EKG   RADIOLOGY  No results found.   PROCEDURES:  Critical Care performed: No  Procedures   MEDICATIONS ORDERED IN ED: Medications - No data to display   IMPRESSION / MDM / ASSESSMENT AND PLAN / ED COURSE  I reviewed the triage vital signs and the nursing notes.                              Differential diagnosis includes, but is not limited to, UTI, supra catheter dysfunction, vulvovaginitis  Patient's presentation is most consistent with acute complicated illness / injury requiring diagnostic workup.  Patient's diagnosis is consistent with suprapubic discomfort secondary  to suprapubic catheter.  The catheter was adjusted and patient noted improvement of her symptoms.  Urine culture is pending at this time.  Any treatment will be based on that result.  Patient is to follow up with the urology clinic as discussed, as needed or otherwise directed. Patient is given ED precautions to return to the ED for any worsening or new symptoms.     FINAL CLINICAL IMPRESSION(S) / ED DIAGNOSES   Final diagnoses:  Suprapubic pain     Rx / DC Orders   ED Discharge Orders     None        Note:  This document was prepared using Dragon voice recognition software and may include unintentional dictation errors.    Lissa Hoard, PA-C 02/09/23 1526    Phineas Semen, MD 02/11/23 [phone]

## 2023-02-09 ENCOUNTER — Telehealth: Payer: Self-pay | Admitting: Physician Assistant

## 2023-02-09 NOTE — Telephone Encounter (Cosign Needed)
Post-visit order placed for urine culture for previously collected suprapubic catheter sample.  The original order had been canceled, likely due to it being placed as a new order as opposed to an add-on order.

## 2023-02-13 DIAGNOSIS — H25011 Cortical age-related cataract, right eye: Secondary | ICD-10-CM | POA: Diagnosis not present

## 2023-02-13 DIAGNOSIS — Z961 Presence of intraocular lens: Secondary | ICD-10-CM | POA: Diagnosis not present

## 2023-02-13 DIAGNOSIS — H353134 Nonexudative age-related macular degeneration, bilateral, advanced atrophic with subfoveal involvement: Secondary | ICD-10-CM | POA: Diagnosis not present

## 2023-02-13 DIAGNOSIS — H2511 Age-related nuclear cataract, right eye: Secondary | ICD-10-CM | POA: Diagnosis not present

## 2023-02-13 LAB — HM DIABETES EYE EXAM

## 2023-02-14 ENCOUNTER — Ambulatory Visit
Admission: RE | Admit: 2023-02-14 | Discharge: 2023-02-14 | Disposition: A | Discharge status: Home / Self Care | Payer: Medicare Other | Source: Ambulatory Visit | Attending: Internal Medicine | Admitting: Internal Medicine

## 2023-02-14 DIAGNOSIS — Z1231 Encounter for screening mammogram for malignant neoplasm of breast: Secondary | ICD-10-CM | POA: Insufficient documentation

## 2023-02-18 ENCOUNTER — Telehealth: Payer: Self-pay | Admitting: Internal Medicine

## 2023-02-18 ENCOUNTER — Other Ambulatory Visit: Payer: Self-pay | Admitting: Internal Medicine

## 2023-02-18 DIAGNOSIS — K219 Gastro-esophageal reflux disease without esophagitis: Secondary | ICD-10-CM

## 2023-02-18 NOTE — Telephone Encounter (Signed)
Already sent to the practice today in a separate encounter, pending provider approval.

## 2023-02-18 NOTE — Telephone Encounter (Signed)
Pt's daughter is calling in checking on the status of the refill request for pantoprazole (PROTONIX) 20 MG tablet [[id number]] . Pt's daughter says pt is out of medication.

## 2023-02-18 NOTE — Telephone Encounter (Signed)
Requested medication (s) are due for refill today: Yes  Requested medication (s) are on the active medication list: Yes  Last refill:  11/17/22  Future visit scheduled: Yes  Notes to clinic:  Unable to refill per protocol, last refill by another provider.      Requested Prescriptions  Pending Prescriptions Disp Refills   pantoprazole (PROTONIX) 20 MG tablet [Pharmacy Med Name: PANTOPRAZOLE SOD DR 20 MG TAB] 90 tablet 0    Sig: TAKE 1 TABLET BY MOUTH EVERY MORNING     Gastroenterology: Proton Pump Inhibitors Passed - 02/18/2023  4:30 PM      Passed - Valid encounter within last 12 months    Recent Outpatient Visits           2 months ago Hospital discharge follow-up   [facility] [name], DO   2 months ago Lower abdominal pain   [facility] Danelle Berry, PA-C   7 months ago Acute cough   [facility] [name], DO   9 months ago Hospital discharge follow-up   [facility] [name], DO   9 months ago Bilateral lower extremity edema   [facility] [place] [facility] Berniece Salines, FNP       Future Appointments             In 2 weeks Carman Ching, PA-C [facility] [place]   In 2 weeks [name], DO [facility]   In 1 month Gollan, Tollie Pizza, MD [facility]

## 2023-02-18 NOTE — Telephone Encounter (Incomplete)
Medication Refill - Medication: pantoprazole (PROTONIX) 20 MG tablet   Has the patient contacted their pharmacy? No.  Preferred Pharmacy (with phone number or street name):  [id number] [place], [place] - [address] Phone: [phone]  Fax: [phone]     Has the patient been seen for an appointment in the last year OR does the patient have an upcoming appointment? Yes.    Agent: Please be advised that RX refills may take up to 3 business days. We ask that you follow-up with your pharmacy.  ***Patients daughter stated she took her last pill this morning

## 2023-02-18 NOTE — Telephone Encounter (Signed)
Already requested by pharmacy today in a separate refill encounter.

## 2023-03-05 ENCOUNTER — Encounter: Payer: Self-pay | Admitting: Physician Assistant

## 2023-03-05 ENCOUNTER — Ambulatory Visit: Payer: Medicare Other | Admitting: Physician Assistant

## 2023-03-05 VITALS — BP 161/82 | HR 56 | Wt 192.6 lb

## 2023-03-05 DIAGNOSIS — R339 Retention of urine, unspecified: Secondary | ICD-10-CM | POA: Diagnosis not present

## 2023-03-05 DIAGNOSIS — Z435 Encounter for attention to cystostomy: Secondary | ICD-10-CM

## 2023-03-05 NOTE — Progress Notes (Signed)
Suprapubic Cath Change  Patient is present today for a suprapubic catheter change due to urinary retention.  8ml of water was drained from the balloon, a 16FR Silastic foley cath was removed from the tract without difficulty.  Site was cleaned and prepped in a sterile fashion with betadine.  A 16FR Silastic foley cath was replaced into the tract no complications were noted. Urine return was noted, 10 ml of sterile water was inflated into the balloon and a night bag was attached for drainage.  Patient tolerated well.     Performed by: Carman Ching, PA-C   Follow up: Return in about 4 weeks (around 04/02/2023) for SPT exchange.

## 2023-03-06 NOTE — Progress Notes (Deleted)
Established Patient Office Visit  Subjective:  Patient ID: Leslie Houston, female    DOB: August 01, 1950  Age: 72 y.o. MRN: [id number]  CC:  No chief complaint on file.   HPI Leslie Houston presents for follow up on chronic medical conditions.  Hypertension/CHF: -Medications: Hydralazine 10 twice daily, Torsemide 20 mg -Patient is compliant with above medications and reports no side effects. -Denies any SOB, CP, vision changes, LE edema or symptoms of hypotension -Following with Cardiology, Dr. Mariah Milling, last seen 06/23/2022 -Currently has ZIO monitor, planning for echo in the next several weeks as well  Hx of CVA/CAD: -Currently on Zetia and aspirin, statin intolerance history  Lipid Panel     Component Value Date/Time   CHOL 181 12/05/2021 1358   TRIG 53 04/05/2022 0642   HDL 73 12/05/2021 1358   CHOLHDL 2.5 12/05/2021 1358   VLDL 9 11/19/2016 1141   LDLCALC 87 12/05/2021 1358   CKD4: -Last creatinine 7/24 2.42 -Following with Nephrology, last seen 05/10/22   Seizure Disorder: -Keppra decreased to 500 mg twice daily due to renal function -Following with Neurology  GERD: -Currently on Protonix 20 mg, symptoms well controlled.  -Avoiding trigger foods and laying on 3 pillows at night  -Denies nausea, vomiting or abdominal pain.  Weight stable appetite good  MDD: -Mood status: stable -Current treatment: not taking, had been on Celexa 20 mg (increased at LOV) but patient's daughter uncomfortable with the medication      11/30/2022   11:00 AM 11/23/2022   11:36 AM 10/12/2022    3:05 PM 07/02/2022    2:19 PM 05/09/2022   11:12 AM  Depression screen PHQ 2/9  Decreased Interest 0 0 0 0 0  Down, Depressed, Hopeless 0 0 0 0 1  PHQ - 2 Score 0 0 0 0 1  Altered sleeping 0 0  0 0  Tired, decreased energy 0 0  0 0  Change in appetite 0 0  0 0  Feeling bad or failure about yourself  0 0  0 0  Trouble concentrating 0 0  0 0  Moving slowly or fidgety/restless 0 0  0 0   Suicidal thoughts 0 0  0 0  PHQ-9 Score 0 0  0 1  Difficult doing work/chores Not difficult at all Not difficult at all   Not difficult at all   Controlled diabetes, Type 2: -Last A1c 5.8% 8/23 -Medications: Nothing  Health Maintenance: -Blood work UTD -Mammogram due   Past Medical History:  Diagnosis Date   (HFpEF) heart failure with preserved ejection fraction (HCC)    a. Pt reports in 2000 she had CHF, details unclear, outside hospital; b. 06/2019 Echo: EF 50-55%, no rwma, Gr2 DD,  mildly dil LA, mild MR.   Acute left PCA stroke (HCC) 06/22/2014   Anxiety    a.) on BZO (alprazolam) PRN   CAD (coronary artery disease)    a. Pt reports in 2000 she had a stent to a coronary artery, details unclear, outside hospital.   Carotid stenosis    a. CT angio head 07/2014 - 50-60% stenoses of prox ICA bilaterally.   CKD (chronic kidney disease), stage IV (HCC)    Depression    Diabetes mellitus (HCC)    Denies.   Edentulous    No upper teeth   Family hx of colon cancer 06/13/2018   Father diagnosed in his 101's   Gout    History of cataract extraction, left 03/21/2021   HOH (hard of hearing)  usually needs to read lips   Hyperlipidemia    Hypertension    Intellectual disability    a.) born premature, limited formal education, has never driven   Myalgia due to statin    Orthostatic hypotension    OSA (obstructive sleep apnea)    a.) not on nocturnal PAP therapy; old/nonfuntioning DME - referred for repeat PSG 01/2022 by neurology   Osteopenia 01/24/2018   DEXA Sept 2019   PONV (postoperative nausea and vomiting)    PTSD (post-traumatic stress disorder)        PUD (peptic ulcer disease)    PVC's (premature ventricular contractions)    Right sided weakness 05/2014   a.) s/p CVA   Seizures (HCC)    Sinus bradycardia    Syncope    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)     Past Surgical History:  Procedure Laterality Date   BLADDER SURGERY     CATARACT  EXTRACTION W/PHACO Left 03/21/2021   Procedure: CATARACT EXTRACTION PHACO AND INTRAOCULAR LENS PLACEMENT (IOC) left 5.86 00:38.0;  Surgeon: Galen Manila, MD;  Location: [facility];  Service: Ophthalmology;  Laterality: Left;  Latex   CESAREAN SECTION     CORONARY ANGIOPLASTY WITH STENT PLACEMENT     CYSTOSCOPY N/A 03/12/2022   Procedure: CYSTOSCOPY;  Surgeon: Vanna Scotland, MD;  Location: [facility];  Service: Urology;  Laterality: N/A;   DENTAL SURGERY     INSERTION OF SUPRAPUBIC CATHETER N/A 03/12/2022   Procedure: INSERTION OF SUPRAPUBIC CATHETER;  Surgeon: Vanna Scotland, MD;  Location: [facility];  Service: Urology;  Laterality: N/A;   LAPAROTOMY N/A 03/22/2022   Procedure: EXPLORATORY LAPAROTOMY; LYSIS OF ADHESIONS;  Surgeon: Sung Amabile, DO;  Location: [facility];  Service: General;  Laterality: N/A;   RECTAL EXAM UNDER ANESTHESIA N/A 03/12/2022   Procedure: PELVIC EXAM UNDER ANESTHESIA;  Surgeon: Vanna Scotland, MD;  Location: [facility];  Service: Urology;  Laterality: N/A;    Family History  Problem Relation Age of Onset   Stroke Mother    Heart attack Mother    Lung cancer Mother    Stroke Father    Prostate cancer Father    Colon cancer Father    Diabetes Mellitus II Daughter    Multiple sclerosis Daughter    Cancer Paternal Grandfather    Kidney disease Son    Heart failure Son    Breast cancer Neg Hx     Social History   Socioeconomic History   Marital status: Single    Spouse name: Not on file   Number of children: 2   Years of education: 12   Highest education level: Not on file  Occupational History   Occupation: Disabled  Tobacco Use   Smoking status: Former    Current packs/day: 0.00    Types: Cigarettes    Quit date: 03/30/2014    Years since quitting: 8.9    Passive exposure: Past   Smokeless tobacco: Never  Vaping Use   Vaping status: Never Used  Substance and Sexual Activity   Alcohol use: No   Drug use: No   Sexual activity: Not  Currently  Other Topics Concern   Not on file  Social History Narrative   Lives at home with her son's father.   Right-handed.   1 cup coffee per day.   Social Determinants of Health   Financial Resource Strain: Low Risk  (10/12/2022)   Overall Financial Resource Strain (CARDIA)    Difficulty of Paying Living  Expenses: Not hard at all  Food Insecurity: No Food Insecurity (11/14/2022)   Hunger Vital Sign    Worried About Running Out of Food in the Last Year: Never true    Ran Out of Food in the Last Year: Never true  Recent Concern: Food Insecurity - Food Insecurity Present (10/12/2022)   Hunger Vital Sign    Worried About Running Out of Food in the Last Year: Never true    Ran Out of Food in the Last Year: Sometimes true  Transportation Needs: No Transportation Needs (11/14/2022)   PRAPARE - Administrator, Civil Service (Medical): No    Lack of Transportation (Non-Medical): No  Physical Activity: Insufficiently Active (10/12/2022)   Exercise Vital Sign    Days of Exercise per Week: 4 days    Minutes of Exercise per Session: 30 min  Stress: No Stress Concern Present (10/10/2021)   [employer] of Occupational Health - Occupational Stress Questionnaire    Feeling of Stress : Only a little  Social Connections: Socially Isolated (10/12/2022)   Social Connection and Isolation Panel [NHANES]    Frequency of Communication with Friends and Family: More than three times a week    Frequency of Social Gatherings with Friends and Family: Three times a week    Attends Religious Services: Never    Active Member of Clubs or Organizations: No    Attends Banker Meetings: Never    Marital Status: Divorced  Catering manager Violence: Not At Risk (11/14/2022)   Humiliation, Afraid, Rape, and Kick questionnaire    Fear of Current or Ex-Partner: No    Emotionally Abused: No    Physically Abused: No    Sexually Abused: No    Outpatient Medications Prior to Visit   Medication Sig Dispense Refill   aluminum-magnesium hydroxide-simethicone (MAALOX) 200-200-20 MG/5ML SUSP Take 30 mLs by mouth 4 (four) times daily -  before meals and at bedtime. 355 mL 0   apixaban (ELIQUIS) 5 MG TABS tablet Take 1 tablet (5 mg total) by mouth 2 (two) times daily. Please take this prescription once the eliquis starter pack has been completed. 60 tablet 4   benzonatate (TESSALON) 100 MG capsule Take 1 capsule (100 mg total) by mouth 2 (two) times daily as needed for cough. 20 capsule 0   Cholecalciferol (VITAMIN D3) 25 MCG (1000 UT) CAPS Take 1 capsule by mouth daily.     citalopram (CELEXA) 20 MG tablet TAKE ONE TABLET BY MOUTH ONCE A DAY 90 tablet 0   feeding supplement (ENSURE ENLIVE / ENSURE PLUS) LIQD Take 237 mLs by mouth 2 (two) times daily between meals. 237 mL 12   ferrous sulfate 325 (65 FE) MG tablet Take 325 mg by mouth daily with breakfast.     fluconazole (DIFLUCAN) 150 MG tablet Take 1 tablet (150 mg total) by mouth every 3 (three) days as needed (for vaginal itching/yeast infection sx). 2 tablet 0   fluticasone (FLONASE) 50 MCG/ACT nasal spray Place 2 sprays into both nostrils daily as needed for allergies (allergies).     furosemide (LASIX) 40 MG tablet Take 1 tablet (40 mg total) by mouth daily. 90 tablet 3   guaiFENesin (MUCINEX) 600 MG 12 hr tablet Take 1 tablet (600 mg total) by mouth daily. 30 tablet 0   hydrALAZINE (APRESOLINE) 10 MG tablet Take 10 mg by mouth 3 (three) times daily.     levETIRAcetam (KEPPRA) 500 MG tablet Take 1 tablet (500 mg total) by mouth  2 (two) times daily. 60 tablet 3   loratadine (CLARITIN) 10 MG tablet TAKE 1 TABLET BY MOUTH ONCE A DAY AS NEEDED FOR ALLERGIES 30 tablet 3   metoCLOPramide (REGLAN) 10 MG tablet Take 1 tablet (10 mg total) by mouth every 6 (six) hours as needed. 30 tablet 0   Multiple Vitamin (MULTIVITAMIN WITH MINERALS) TABS tablet Take 1 tablet by mouth daily. 30 tablet 1   nitroGLYCERIN (NITROSTAT) 0.4 MG SL  tablet Place 1 tablet (0.4 mg total) under the tongue every 5 (five) minutes as needed for chest pain. 30 tablet 12   nystatin (MYCOSTATIN/NYSTOP) powder Apply 1 Application topically 3 (three) times daily. 60 g 1   ondansetron (ZOFRAN-ODT) 4 MG disintegrating tablet Take 1 tablet (4 mg total) by mouth every 8 (eight) hours as needed. 20 tablet 0   pantoprazole (PROTONIX) 20 MG tablet TAKE 1 TABLET BY MOUTH EVERY MORNING 90 tablet 0   polyethylene glycol (MIRALAX / GLYCOLAX) 17 g packet Take 17 g by mouth daily. 14 each 0   traMADol (ULTRAM) 50 MG tablet Take 1 tablet (50 mg total) by mouth every 12 (twelve) hours as needed. 15 tablet 0   No facility-administered medications prior to visit.    Allergies  Allergen Reactions   Bactrim [Sulfamethoxazole-Trimethoprim] Other (See Comments)    Hematuria, dysuria   Latex Rash   Penicillin G Itching   Atorvastatin Other (See Comments)    Myalgias    Other Hives    Adhesive tape   Amlodipine Swelling    Leg swelling   Doxycycline     Blood in urine    ROS Review of Systems  Constitutional:  Negative for chills and fever.  Respiratory:  Negative for cough and shortness of breath.   Cardiovascular:  Negative for chest pain, palpitations and leg swelling.  Gastrointestinal:  Negative for blood in stool.  Genitourinary:  Negative for hematuria.      Objective:    Physical Exam Constitutional:      Appearance: Normal appearance.  HENT:     Head: Normocephalic and atraumatic.  Eyes:     Conjunctiva/sclera: Conjunctivae normal.  Cardiovascular:     Rate and Rhythm: Normal rate and regular rhythm.  Pulmonary:     Effort: Pulmonary effort is normal.     Breath sounds: Normal breath sounds.  Skin:    General: Skin is warm and dry.  Neurological:     General: No focal deficit present.     Mental Status: She is alert. Mental status is at baseline.  Psychiatric:        Mood and Affect: Mood normal.        Behavior: Behavior normal.      There were no vitals taken for this visit. Wt Readings from Last 3 Encounters:  03/05/23 192 lb 9.6 oz (87.4 kg)  02/08/23 187 lb 6.3 oz (85 kg)  02/04/23 189 lb 2 oz (85.8 kg)     Health Maintenance Due  Topic Date Due   Fecal DNA (Cologuard)  Never done   Zoster Vaccines- Shingrix (1 of 2) Never done   FOOT EXAM  01/22/2020   INFLUENZA VACCINE  11/29/2022   COVID-19 Vaccine (2 - 2023-24 season) 12/30/2022    There are no preventive care reminders to display for this patient.  Lab Results  Component Value Date   TSH 0.451 11/20/2022   Lab Results  Component Value Date   WBC 4.4 11/20/2022   HGB 12.3 11/20/2022  HCT 37.3 11/20/2022   MCV 99.2 11/20/2022   PLT 254 11/20/2022   Lab Results  Component Value Date   NA 135 11/20/2022   K 5.1 11/20/2022   CO2 19 (L) 11/20/2022   GLUCOSE 86 11/20/2022   BUN 46 (H) 11/20/2022   CREATININE 2.42 (H) 11/20/2022   BILITOT 1.0 11/20/2022   ALKPHOS 84 11/20/2022   AST 37 11/20/2022   ALT 41 11/20/2022   PROT 7.3 11/20/2022   ALBUMIN 3.4 (L) 11/20/2022   CALCIUM 8.8 (L) 11/20/2022   ANIONGAP 7 11/20/2022   EGFR 18 (L) 05/03/2022   Lab Results  Component Value Date   CHOL 181 12/05/2021   Lab Results  Component Value Date   HDL 73 12/05/2021   Lab Results  Component Value Date   LDLCALC 87 12/05/2021   Lab Results  Component Value Date   TRIG 53 04/05/2022   Lab Results  Component Value Date   CHOLHDL 2.5 12/05/2021   Lab Results  Component Value Date   HGBA1C 4.8 11/30/2022      Assessment & Plan:   1. Hospital discharge follow-up/Deep vein thrombosis (DVT) of popliteal vein of left lower extremity, unspecified chronicity [facility]): Hospital discharge summary from 11/17/2022 reviewed along with labs and imaging.  Patient doing better with Eliquis now that she is on 5 mg twice daily, plan for now to continue for another 3 months until follow-up.  2. Bradycardia: Following with cardiology, note  reviewed from 11/21/2022.  Patient is currently wearing a ZIO monitor.  She does have first-degree AV block on EKG, asymptomatic.  Will continue to follow along.  3. Controlled type 2 diabetes mellitus without complication, without long-term current use of insulin (HCC): Having some new blurred vision, however she has not had an eye exam in several years.  Referral back to ophthalmology placed.  A1c here good at 4.7, diabetes continues to be well-controlled.  - Ambulatory referral to Ophthalmology - POCT HgB A1C  4. Encounter for screening mammogram for malignant neoplasm of breast: Mammogram due but patient can schedule after cardiac testing is complete.   - MM 3D SCREENING MAMMOGRAM BILATERAL BREAST; Future   Follow-up: No follow-ups on file.    [name], DO

## 2023-03-07 ENCOUNTER — Ambulatory Visit: Payer: Medicare Other | Admitting: Internal Medicine

## 2023-03-18 NOTE — Progress Notes (Unsigned)
 Established Patient Office Visit  Subjective:  Patient ID: Leslie Houston, female    DOB: August 01, 1950  Age: 72 y.o. MRN: [id number]  CC:  No chief complaint on file.   HPI Leslie Houston presents for follow up on chronic medical conditions.  Hypertension/CHF: -Medications: Hydralazine 10 twice daily, Torsemide 20 mg -Patient is compliant with above medications and reports no side effects. -Denies any SOB, CP, vision changes, LE edema or symptoms of hypotension -Following with Cardiology, Dr. Mariah Milling, last seen 06/23/2022 -Currently has ZIO monitor, planning for echo in the next several weeks as well  Hx of CVA/CAD: -Currently on Zetia and aspirin, statin intolerance history  Lipid Panel     Component Value Date/Time   CHOL 181 12/05/2021 1358   TRIG 53 04/05/2022 0642   HDL 73 12/05/2021 1358   CHOLHDL 2.5 12/05/2021 1358   VLDL 9 11/19/2016 1141   LDLCALC 87 12/05/2021 1358   CKD4: -Last creatinine 7/24 2.42 -Following with Nephrology, last seen 05/10/22   Seizure Disorder: -Keppra decreased to 500 mg twice daily due to renal function -Following with Neurology  GERD: -Currently on Protonix 20 mg, symptoms well controlled.  -Avoiding trigger foods and laying on 3 pillows at night  -Denies nausea, vomiting or abdominal pain.  Weight stable appetite good  MDD: -Mood status: stable -Current treatment: not taking, had been on Celexa 20 mg (increased at LOV) but patient's daughter uncomfortable with the medication      11/30/2022   11:00 AM 11/23/2022   11:36 AM 10/12/2022    3:05 PM 07/02/2022    2:19 PM 05/09/2022   11:12 AM  Depression screen PHQ 2/9  Decreased Interest 0 0 0 0 0  Down, Depressed, Hopeless 0 0 0 0 1  PHQ - 2 Score 0 0 0 0 1  Altered sleeping 0 0  0 0  Tired, decreased energy 0 0  0 0  Change in appetite 0 0  0 0  Feeling bad or failure about yourself  0 0  0 0  Trouble concentrating 0 0  0 0  Moving slowly or fidgety/restless 0 0  0 0   Suicidal thoughts 0 0  0 0  PHQ-9 Score 0 0  0 1  Difficult doing work/chores Not difficult at all Not difficult at all   Not difficult at all   Controlled diabetes, Type 2: -Last A1c 5.8% 8/23 -Medications: Nothing  Health Maintenance: -Blood work UTD -Mammogram due   Past Medical History:  Diagnosis Date   (HFpEF) heart failure with preserved ejection fraction (HCC)    a. Pt reports in 2000 she had CHF, details unclear, outside hospital; b. 06/2019 Echo: EF 50-55%, no rwma, Gr2 DD,  mildly dil LA, mild MR.   Acute left PCA stroke (HCC) 06/22/2014   Anxiety    a.) on BZO (alprazolam) PRN   CAD (coronary artery disease)    a. Pt reports in 2000 she had a stent to a coronary artery, details unclear, outside hospital.   Carotid stenosis    a. CT angio head 07/2014 - 50-60% stenoses of prox ICA bilaterally.   CKD (chronic kidney disease), stage IV (HCC)    Depression    Diabetes mellitus (HCC)    Denies.   Edentulous    No upper teeth   Family hx of colon cancer 06/13/2018   Father diagnosed in his 101's   Gout    History of cataract extraction, left 03/21/2021   HOH (hard of hearing)  usually needs to read lips   Hyperlipidemia    Hypertension    Intellectual disability    a.) born premature, limited formal education, has never driven   Myalgia due to statin    Orthostatic hypotension    OSA (obstructive sleep apnea)    a.) not on nocturnal PAP therapy; old/nonfuntioning DME - referred for repeat PSG 01/2022 by neurology   Osteopenia 01/24/2018   DEXA Sept 2019   PONV (postoperative nausea and vomiting)    PTSD (post-traumatic stress disorder)        PUD (peptic ulcer disease)    PVC's (premature ventricular contractions)    Right sided weakness 05/2014   a.) s/p CVA   Seizures (HCC)    Sinus bradycardia    Syncope    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)     Past Surgical History:  Procedure Laterality Date   BLADDER SURGERY     CATARACT  EXTRACTION W/PHACO Left 03/21/2021   Procedure: CATARACT EXTRACTION PHACO AND INTRAOCULAR LENS PLACEMENT (IOC) left 5.86 00:38.0;  Surgeon: Galen Manila, MD;  Location: [facility];  Service: Ophthalmology;  Laterality: Left;  Latex   CESAREAN SECTION     CORONARY ANGIOPLASTY WITH STENT PLACEMENT     CYSTOSCOPY N/A 03/12/2022   Procedure: CYSTOSCOPY;  Surgeon: Vanna Scotland, MD;  Location: [facility];  Service: Urology;  Laterality: N/A;   DENTAL SURGERY     INSERTION OF SUPRAPUBIC CATHETER N/A 03/12/2022   Procedure: INSERTION OF SUPRAPUBIC CATHETER;  Surgeon: Vanna Scotland, MD;  Location: [facility];  Service: Urology;  Laterality: N/A;   LAPAROTOMY N/A 03/22/2022   Procedure: EXPLORATORY LAPAROTOMY; LYSIS OF ADHESIONS;  Surgeon: Sung Amabile, DO;  Location: [facility];  Service: General;  Laterality: N/A;   RECTAL EXAM UNDER ANESTHESIA N/A 03/12/2022   Procedure: PELVIC EXAM UNDER ANESTHESIA;  Surgeon: Vanna Scotland, MD;  Location: [facility];  Service: Urology;  Laterality: N/A;    Family History  Problem Relation Age of Onset   Stroke Mother    Heart attack Mother    Lung cancer Mother    Stroke Father    Prostate cancer Father    Colon cancer Father    Diabetes Mellitus II Daughter    Multiple sclerosis Daughter    Cancer Paternal Grandfather    Kidney disease Son    Heart failure Son    Breast cancer Neg Hx     Social History   Socioeconomic History   Marital status: Single    Spouse name: Not on file   Number of children: 2   Years of education: 12   Highest education level: Not on file  Occupational History   Occupation: Disabled  Tobacco Use   Smoking status: Former    Current packs/day: 0.00    Types: Cigarettes    Quit date: 03/30/2014    Years since quitting: 8.9    Passive exposure: Past   Smokeless tobacco: Never  Vaping Use   Vaping status: Never Used  Substance and Sexual Activity   Alcohol use: No   Drug use: No   Sexual activity: Not  Currently  Other Topics Concern   Not on file  Social History Narrative   Lives at home with her son's father.   Right-handed.   1 cup coffee per day.   Social Determinants of Health   Financial Resource Strain: Low Risk  (10/12/2022)   Overall Financial Resource Strain (CARDIA)    Difficulty of Paying Living  Expenses: Not hard at all  Food Insecurity: No Food Insecurity (11/14/2022)   Hunger Vital Sign    Worried About Running Out of Food in the Last Year: Never true    Ran Out of Food in the Last Year: Never true  Recent Concern: Food Insecurity - Food Insecurity Present (10/12/2022)   Hunger Vital Sign    Worried About Running Out of Food in the Last Year: Never true    Ran Out of Food in the Last Year: Sometimes true  Transportation Needs: No Transportation Needs (11/14/2022)   PRAPARE - Administrator, Civil Service (Medical): No    Lack of Transportation (Non-Medical): No  Physical Activity: Insufficiently Active (10/12/2022)   Exercise Vital Sign    Days of Exercise per Week: 4 days    Minutes of Exercise per Session: 30 min  Stress: No Stress Concern Present (10/10/2021)   [employer] of Occupational Health - Occupational Stress Questionnaire    Feeling of Stress : Only a little  Social Connections: Socially Isolated (10/12/2022)   Social Connection and Isolation Panel [NHANES]    Frequency of Communication with Friends and Family: More than three times a week    Frequency of Social Gatherings with Friends and Family: Three times a week    Attends Religious Services: Never    Active Member of Clubs or Organizations: No    Attends Banker Meetings: Never    Marital Status: Divorced  Catering manager Violence: Not At Risk (11/14/2022)   Humiliation, Afraid, Rape, and Kick questionnaire    Fear of Current or Ex-Partner: No    Emotionally Abused: No    Physically Abused: No    Sexually Abused: No    Outpatient Medications Prior to Visit   Medication Sig Dispense Refill   aluminum-magnesium hydroxide-simethicone (MAALOX) 200-200-20 MG/5ML SUSP Take 30 mLs by mouth 4 (four) times daily -  before meals and at bedtime. 355 mL 0   apixaban (ELIQUIS) 5 MG TABS tablet Take 1 tablet (5 mg total) by mouth 2 (two) times daily. Please take this prescription once the eliquis starter pack has been completed. 60 tablet 4   benzonatate (TESSALON) 100 MG capsule Take 1 capsule (100 mg total) by mouth 2 (two) times daily as needed for cough. 20 capsule 0   Cholecalciferol (VITAMIN D3) 25 MCG (1000 UT) CAPS Take 1 capsule by mouth daily.     citalopram (CELEXA) 20 MG tablet TAKE ONE TABLET BY MOUTH ONCE A DAY 90 tablet 0   feeding supplement (ENSURE ENLIVE / ENSURE PLUS) LIQD Take 237 mLs by mouth 2 (two) times daily between meals. 237 mL 12   ferrous sulfate 325 (65 FE) MG tablet Take 325 mg by mouth daily with breakfast.     fluconazole (DIFLUCAN) 150 MG tablet Take 1 tablet (150 mg total) by mouth every 3 (three) days as needed (for vaginal itching/yeast infection sx). 2 tablet 0   fluticasone (FLONASE) 50 MCG/ACT nasal spray Place 2 sprays into both nostrils daily as needed for allergies (allergies).     furosemide (LASIX) 40 MG tablet Take 1 tablet (40 mg total) by mouth daily. 90 tablet 3   guaiFENesin (MUCINEX) 600 MG 12 hr tablet Take 1 tablet (600 mg total) by mouth daily. 30 tablet 0   hydrALAZINE (APRESOLINE) 10 MG tablet Take 10 mg by mouth 3 (three) times daily.     levETIRAcetam (KEPPRA) 500 MG tablet Take 1 tablet (500 mg total) by mouth  2 (two) times daily. 60 tablet 3   loratadine (CLARITIN) 10 MG tablet TAKE 1 TABLET BY MOUTH ONCE A DAY AS NEEDED FOR ALLERGIES 30 tablet 3   metoCLOPramide (REGLAN) 10 MG tablet Take 1 tablet (10 mg total) by mouth every 6 (six) hours as needed. 30 tablet 0   Multiple Vitamin (MULTIVITAMIN WITH MINERALS) TABS tablet Take 1 tablet by mouth daily. 30 tablet 1   nitroGLYCERIN (NITROSTAT) 0.4 MG SL  tablet Place 1 tablet (0.4 mg total) under the tongue every 5 (five) minutes as needed for chest pain. 30 tablet 12   nystatin (MYCOSTATIN/NYSTOP) powder Apply 1 Application topically 3 (three) times daily. 60 g 1   ondansetron (ZOFRAN-ODT) 4 MG disintegrating tablet Take 1 tablet (4 mg total) by mouth every 8 (eight) hours as needed. 20 tablet 0   pantoprazole (PROTONIX) 20 MG tablet TAKE 1 TABLET BY MOUTH EVERY MORNING 90 tablet 0   polyethylene glycol (MIRALAX / GLYCOLAX) 17 g packet Take 17 g by mouth daily. 14 each 0   traMADol (ULTRAM) 50 MG tablet Take 1 tablet (50 mg total) by mouth every 12 (twelve) hours as needed. 15 tablet 0   No facility-administered medications prior to visit.    Allergies  Allergen Reactions   Bactrim [Sulfamethoxazole-Trimethoprim] Other (See Comments)    Hematuria, dysuria   Latex Rash   Penicillin G Itching   Atorvastatin Other (See Comments)    Myalgias    Other Hives    Adhesive tape   Amlodipine Swelling    Leg swelling   Doxycycline     Blood in urine    ROS Review of Systems  Constitutional:  Negative for chills and fever.  Respiratory:  Negative for cough and shortness of breath.   Cardiovascular:  Negative for chest pain, palpitations and leg swelling.  Gastrointestinal:  Negative for blood in stool.  Genitourinary:  Negative for hematuria.      Objective:    Physical Exam Constitutional:      Appearance: Normal appearance.  HENT:     Head: Normocephalic and atraumatic.  Eyes:     Conjunctiva/sclera: Conjunctivae normal.  Cardiovascular:     Rate and Rhythm: Normal rate and regular rhythm.  Pulmonary:     Effort: Pulmonary effort is normal.     Breath sounds: Normal breath sounds.  Skin:    General: Skin is warm and dry.  Neurological:     General: No focal deficit present.     Mental Status: She is alert. Mental status is at baseline.  Psychiatric:        Mood and Affect: Mood normal.        Behavior: Behavior normal.      There were no vitals taken for this visit. Wt Readings from Last 3 Encounters:  03/05/23 192 lb 9.6 oz (87.4 kg)  02/08/23 187 lb 6.3 oz (85 kg)  02/04/23 189 lb 2 oz (85.8 kg)     Health Maintenance Due  Topic Date Due   Fecal DNA (Cologuard)  Never done   Zoster Vaccines- Shingrix (1 of 2) Never done   FOOT EXAM  01/22/2020   INFLUENZA VACCINE  11/29/2022   COVID-19 Vaccine (2 - 2023-24 season) 12/30/2022    There are no preventive care reminders to display for this patient.  Lab Results  Component Value Date   TSH 0.451 11/20/2022   Lab Results  Component Value Date   WBC 4.4 11/20/2022   HGB 12.3 11/20/2022  HCT 37.3 11/20/2022   MCV 99.2 11/20/2022   PLT 254 11/20/2022   Lab Results  Component Value Date   NA 135 11/20/2022   K 5.1 11/20/2022   CO2 19 (L) 11/20/2022   GLUCOSE 86 11/20/2022   BUN 46 (H) 11/20/2022   CREATININE 2.42 (H) 11/20/2022   BILITOT 1.0 11/20/2022   ALKPHOS 84 11/20/2022   AST 37 11/20/2022   ALT 41 11/20/2022   PROT 7.3 11/20/2022   ALBUMIN 3.4 (L) 11/20/2022   CALCIUM 8.8 (L) 11/20/2022   ANIONGAP 7 11/20/2022   EGFR 18 (L) 05/03/2022   Lab Results  Component Value Date   CHOL 181 12/05/2021   Lab Results  Component Value Date   HDL 73 12/05/2021   Lab Results  Component Value Date   LDLCALC 87 12/05/2021   Lab Results  Component Value Date   TRIG 53 04/05/2022   Lab Results  Component Value Date   CHOLHDL 2.5 12/05/2021   Lab Results  Component Value Date   HGBA1C 4.8 11/30/2022      Assessment & Plan:   1. Hospital discharge follow-up/Deep vein thrombosis (DVT) of popliteal vein of left lower extremity, unspecified chronicity [facility]): Hospital discharge summary from 11/17/2022 reviewed along with labs and imaging.  Patient doing better with Eliquis now that she is on 5 mg twice daily, plan for now to continue for another 3 months until follow-up.  2. Bradycardia: Following with cardiology, note  reviewed from 11/21/2022.  Patient is currently wearing a ZIO monitor.  She does have first-degree AV block on EKG, asymptomatic.  Will continue to follow along.  3. Controlled type 2 diabetes mellitus without complication, without long-term current use of insulin (HCC): Having some new blurred vision, however she has not had an eye exam in several years.  Referral back to ophthalmology placed.  A1c here good at 4.7, diabetes continues to be well-controlled.  - Ambulatory referral to Ophthalmology - POCT HgB A1C  4. Encounter for screening mammogram for malignant neoplasm of breast: Mammogram due but patient can schedule after cardiac testing is complete.   - MM 3D SCREENING MAMMOGRAM BILATERAL BREAST; Future   Follow-up: No follow-ups on file.    [name], DO

## 2023-03-19 ENCOUNTER — Encounter: Payer: Self-pay | Admitting: Internal Medicine

## 2023-03-19 ENCOUNTER — Ambulatory Visit (INDEPENDENT_AMBULATORY_CARE_PROVIDER_SITE_OTHER): Payer: Medicare Other | Admitting: Internal Medicine

## 2023-03-19 VITALS — BP 130/70 | HR 69 | Resp 18 | Ht 61.0 in | Wt 191.0 lb

## 2023-03-19 DIAGNOSIS — Z86718 Personal history of other venous thrombosis and embolism: Secondary | ICD-10-CM | POA: Diagnosis not present

## 2023-03-19 DIAGNOSIS — M1990 Unspecified osteoarthritis, unspecified site: Secondary | ICD-10-CM

## 2023-03-19 DIAGNOSIS — I10 Essential (primary) hypertension: Secondary | ICD-10-CM

## 2023-03-19 NOTE — Patient Instructions (Addendum)
It was great seeing you today!  Plan discussed at today's visit: -Monitor blood pressure, if blood pressure is > 140/90 on average please call me and we will add something else to her medications. For now continue the same medications -Continue Eliquis 5 mg twice daily for 6 months total - can discontinue on May 16, 2023 -Can use over the counter Voltaren gel for arthritis pain   Follow up in: 3 months  Take care and let us know if you have any questions or concerns prior to your next visit.  Dr. Caralee Ates

## 2023-03-20 DIAGNOSIS — H353134 Nonexudative age-related macular degeneration, bilateral, advanced atrophic with subfoveal involvement: Secondary | ICD-10-CM | POA: Diagnosis not present

## 2023-03-22 ENCOUNTER — Ambulatory Visit: Payer: Medicare Other | Admitting: Cardiovascular Disease

## 2023-04-02 ENCOUNTER — Ambulatory Visit: Payer: Medicare Other | Admitting: Physician Assistant

## 2023-04-02 ENCOUNTER — Encounter: Payer: Self-pay | Admitting: Physician Assistant

## 2023-04-02 VITALS — BP 151/71 | HR 62

## 2023-04-02 DIAGNOSIS — R339 Retention of urine, unspecified: Secondary | ICD-10-CM | POA: Diagnosis not present

## 2023-04-02 DIAGNOSIS — Z435 Encounter for attention to cystostomy: Secondary | ICD-10-CM

## 2023-04-02 NOTE — Progress Notes (Signed)
Suprapubic Cath Change  Patient is present today for a suprapubic catheter change due to urinary retention.  8ml of water was drained from the balloon, a 16FR Silastic foley cath was removed from the tract with out difficulty.  Site was cleaned and prepped in a sterile fashion with betadine.  A 16FR Silastic foley cath was replaced into the tract no complications were noted. Urine return was noted, 10 ml of sterile water was inflated into the balloon and a leg bag was attached for drainage.  Patient tolerated well.     Performed by: Carman Ching, PA-C   Follow up: Return in about 4 weeks (around 04/30/2023) for SPT exchange.

## 2023-04-08 ENCOUNTER — Other Ambulatory Visit: Payer: Self-pay | Admitting: Internal Medicine

## 2023-04-08 NOTE — Telephone Encounter (Signed)
Medication Refill -  Most Recent Primary Care Visit:  Provider: [name]  Department: [facility]  Visit Type: OFFICE VISIT  Date: 03/19/2023  Medication: levETIRAcetam (KEPPRA) 500 MG tablet [[id number]]   Prescribed 11/17/2022 Historical Provider  Has the patient contacted their pharmacy? Yes (Agent: If no, request that the patient contact the pharmacy for the refill. If patient does not wish to contact the pharmacy document the reason why and proceed with request.) (Agent: If yes, when and what did the pharmacy advise?)  Is this the correct pharmacy for this prescription? Yes If no, delete pharmacy and type the correct one.  This is the patient's preferred pharmacy:  [facility] [address], [place] - [address] [place] [address] Phone: [phone] Fax: [phone]   Has the prescription been filled recently? Yes  Is the patient out of the medication? Yes  Has the patient been seen for an appointment in the last year OR does the patient have an upcoming appointment? Yes  Can we respond through MyChart? Yes  Agent: Please be advised that Rx refills may take up to 3 business days. We ask that you follow-up with your pharmacy.

## 2023-04-09 NOTE — Telephone Encounter (Signed)
Requested medication (s) are due for refill today: Yes  Requested medication (s) are on the active medication list: Yes  Last refill:  11/17/22, #60, 2RF  Future visit scheduled: Yes  Notes to clinic:  Unable to refill per protocol, last refill by another provider.      Requested Prescriptions  Pending Prescriptions Disp Refills   levETIRAcetam (KEPPRA) 500 MG tablet 60 tablet 3    Sig: Take 1 tablet (500 mg total) by mouth 2 (two) times daily.     Neurology:  Anticonvulsants - levetiracetam Failed - 04/08/2023  3:19 PM      Failed - Cr in normal range and within 360 days    Creat  Date Value Ref Range Status  05/09/2022 2.48 (H) 0.60 - 1.00 mg/dL Final   Creatinine, Ser  Date Value Ref Range Status  11/20/2022 2.42 (H) 0.44 - 1.00 mg/dL Final   Creatinine, Urine  Date Value Ref Range Status  05/09/2022 29 20 - 275 mg/dL Final         Passed - Completed PHQ-2 or PHQ-9 in the last 360 days      Passed - Valid encounter within last 12 months    Recent Outpatient Visits           3 weeks ago Essential hypertension   [facility] [name], DO   4 months ago Hospital discharge follow-up   [facility] [name], DO   4 months ago Lower abdominal pain   [facility] Danelle Berry, PA-C   9 months ago Acute cough   [facility] [name], [place]   11 months ago Hospital discharge follow-up   [facility] [name], DO       Future Appointments             In 4 weeks Carman Ching, PA-C [facility] [place]   In 2 months [name], DO [facility]

## 2023-04-09 NOTE — Telephone Encounter (Signed)
This refill med, levETIRAcetam (KEPPRA) 500 MG tablet  was prescribed a Dr when pt was in the hospital, not a Dr she sees normally

## 2023-04-09 NOTE — Telephone Encounter (Signed)
Requested medications are due for refill today.  yes  Requested medications are on the active medications list.  yes  Last refill. 11/17/2022 #60 3 rf  Future visit scheduled.   yes  Notes to clinic.  Rx is signed by Ripudeep Rai.    Requested Prescriptions  Pending Prescriptions Disp Refills   levETIRAcetam (KEPPRA) 500 MG tablet 60 tablet 3    Sig: Take 1 tablet (500 mg total) by mouth 2 (two) times daily.     Neurology:  Anticonvulsants - levetiracetam Failed - 04/09/2023  4:52 PM      Failed - Cr in normal range and within 360 days    Creat  Date Value Ref Range Status  05/09/2022 2.48 (H) 0.60 - 1.00 mg/dL Final   Creatinine, Ser  Date Value Ref Range Status  11/20/2022 2.42 (H) 0.44 - 1.00 mg/dL Final   Creatinine, Urine  Date Value Ref Range Status  05/09/2022 29 20 - 275 mg/dL Final         Passed - Completed PHQ-2 or PHQ-9 in the last 360 days      Passed - Valid encounter within last 12 months    Recent Outpatient Visits           3 weeks ago Essential hypertension   [facility] [name], DO   4 months ago Hospital discharge follow-up   [facility] [name], DO   4 months ago Lower abdominal pain   [facility] Danelle Berry, PA-C   9 months ago Acute cough   [facility] [name], [place]   11 months ago Hospital discharge follow-up   [facility] [name], DO       Future Appointments             In 4 weeks Carman Ching, PA-C [facility] [place]   In 2 months [name], DO [facility]

## 2023-04-11 ENCOUNTER — Telehealth: Payer: Self-pay | Admitting: Medical

## 2023-04-11 ENCOUNTER — Telehealth: Payer: Self-pay | Admitting: Internal Medicine

## 2023-04-11 NOTE — Telephone Encounter (Signed)
Left message for patient family to call office. Patient need to   have Neurologist refill. Patient got #60 with 3 refills in July. She maybe a month behind on medication

## 2023-04-11 NOTE — Telephone Encounter (Addendum)
Pt daughter Leslie Houston is calling upset due to pt's medication for seizures being denied: levETIRAcetam (KEPPRA) 500 mg tablet.  Explained to Leslie Houston that this was prescribed by neuro and the request needs to go to them. She is upset, stating that Dr. Caralee Ates or her nurse should have called them and explained this to them as it's been almost a week. She is asking that the nurse call them and ask for the refill. I explained that it needs to come from the patient, or she can reach out. Leslie Houston insists that we need to reach out to them.  Requesting a callback today.

## 2023-04-11 NOTE — Telephone Encounter (Signed)
*  STAT* If patient is at the pharmacy, call can be transferred to refill team.   1. Which medications need to be refilled? (please list name of each medication and dose if known) new prescription for Levetira 500 mg   2. Would you like to learn more about the convenience, safety, & potential cost savings by using the [facility]?      3. Are you open to using the [facility] (Type [facility].   4. Which pharmacy/location (including street and city if local pharmacy) is medication to be sent to? [facility] [address] [place]  RX# [id number]   5. Do they need a 30 day or 90 day supply? #60 and refills

## 2023-04-12 ENCOUNTER — Telehealth: Payer: Self-pay | Admitting: Neurology

## 2023-04-12 DIAGNOSIS — G40209 Localization-related (focal) (partial) symptomatic epilepsy and epileptic syndromes with complex partial seizures, not intractable, without status epilepticus: Secondary | ICD-10-CM

## 2023-04-12 DIAGNOSIS — I639 Cerebral infarction, unspecified: Secondary | ICD-10-CM

## 2023-04-12 NOTE — Telephone Encounter (Signed)
Synetta Fail pt's caregiver states pt has been very sick and in and out of the hospital a lot recently. Would like to speak with someone. Requesting call back

## 2023-04-12 NOTE — Telephone Encounter (Signed)
I spoke to pt's daughter she is aware that she will have to contact prescribing provider or her pcp. We don't refill non cardiac medications.

## 2023-04-15 MED ORDER — LEVETIRACETAM 500 MG PO TABS: 500.0000 mg | ORAL_TABLET | Freq: Two times a day (BID) | ORAL | 1 refills | Status: DC

## 2023-04-15 NOTE — Telephone Encounter (Signed)
Meds ordered this encounter  Medications   levETIRAcetam (KEPPRA) 500 MG tablet    Sig: Take 1 tablet (500 mg total) by mouth 2 (two) times daily.    Dispense:  180 tablet    Refill:  1      Check Keppra level  Orders Placed This Encounter  Procedures   Levetiracetam level      Add her to my clinic in Jan 2025, ok virtual visit

## 2023-04-15 NOTE — Telephone Encounter (Signed)
Call to daughter and to caregiver anita, they both state that patient ha been in and out of hospital with UTI and infections and has not been able to get up here for a visit. She is needing a refill on her Keppra. Last note says 1000 mg twice daily but cardiologist change it to 500 mg twice daily in July. They report no seizures. Advised I would send to Dr. Terrace Arabia for review and dosing. Also needs am appointment, can they do a virtual visit?

## 2023-04-15 NOTE — Telephone Encounter (Signed)
Called and spoke to anita and stated keppra ordered, lab for keppra level  ordered, scheduled for 05/07/22 at 10am w/9:30 am check in time

## 2023-04-29 DIAGNOSIS — G40209 Localization-related (focal) (partial) symptomatic epilepsy and epileptic syndromes with complex partial seizures, not intractable, without status epilepticus: Secondary | ICD-10-CM | POA: Diagnosis not present

## 2023-04-29 DIAGNOSIS — I639 Cerebral infarction, unspecified: Secondary | ICD-10-CM | POA: Diagnosis not present

## 2023-04-30 LAB — LEVETIRACETAM LEVEL: Levetiracetam Lvl: 51.6 ug/mL — ABNORMAL HIGH (ref 10.0–40.0)

## 2023-05-07 ENCOUNTER — Ambulatory Visit: Payer: Medicare Other | Admitting: Physician Assistant

## 2023-05-08 ENCOUNTER — Encounter: Payer: Self-pay | Admitting: Neurology

## 2023-05-08 ENCOUNTER — Ambulatory Visit: Payer: Medicare Other | Admitting: Neurology

## 2023-05-08 VITALS — BP 166/74 | HR 56 | Resp 15 | Ht 61.0 in | Wt 195.5 lb

## 2023-05-08 DIAGNOSIS — R269 Unspecified abnormalities of gait and mobility: Secondary | ICD-10-CM | POA: Diagnosis not present

## 2023-05-08 DIAGNOSIS — I639 Cerebral infarction, unspecified: Secondary | ICD-10-CM

## 2023-05-08 DIAGNOSIS — G40209 Localization-related (focal) (partial) symptomatic epilepsy and epileptic syndromes with complex partial seizures, not intractable, without status epilepticus: Secondary | ICD-10-CM

## 2023-05-08 MED ORDER — LEVETIRACETAM 500 MG PO TABS: 500.0000 mg | ORAL_TABLET | Freq: Two times a day (BID) | ORAL | 3 refills | Status: DC

## 2023-05-08 NOTE — Progress Notes (Signed)
 Chief Complaint  Patient presents with   Hospitalization Follow-up    Rm14, caregiver anita present, was hospitalized, doing much better, last sz was 1 year ago, had kidney issues, dvt left leg placed on eliquis ,  was told overdue to be seen and that's why they came.       ASSESSMENT AND PLAN  Leslie Houston is a 73 y.o. female   Epilepsy, mild mental retardation, born premature Large left occipital stroke in 2016  Last recurrent seizure while taking Keppra  500/1000 on February 01, 2022  She was on Keppra  1000 mg twice a day, dosage was renally adjusted to current 500 mg twice a day since July, with GFR of 21, Keppra  level was 51 Newly diagnosed left lower extremity DVT  On Eliquis  now  Return To Clinic With NP In 12 Months okay with virtual visit   DIAGNOSTIC DATA (LABS, IMAGING, TESTING) - I reviewed patient records, labs, notes, testing and imaging myself where available.   MEDICAL HISTORY:  Leslie Houston, is a 73 year old female, accompanied by her friends, follow-up for epilepsy, her primary care physician is Dr. Bernardo, [place],  I reviewed and summarized the referring note. Depression Mild mental retardation Incontinence Stroke Obstructive sleep apnea Coronary artery disease, history of stent, Hypertension Hyperlipidemia  She has been patient of our clinic for a long time for epilepsy. 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   She had a history of epilepsy, generalized tonic-clonic seizure when she was young, since her most recent left occipital stroke early 2016, she began to have frequent spells, proceeding by smelling dusty smell, eyes crossed, transient confusion , she sometimes fell to the ground, lasting  less than a minute, she can have few spells in one day.She presented to hospital multiple times for similar presentation, I have   MRI of brain in May 2016, large left occipital stroke, mild small vessel disease at supratentorium.  Echocardiogram April 2016, normal ejection fraction 55-60%, no significant structural abnormality,   Cardiac monitoring showed no significant abnormality,  She was started on Keppra  since 2015, initially complaint side effect, had a shorter trial of Topamax , because even worse GI side effect, went back on Keppra , on titrating dose, now taking Keppra  500-1 solvent  Hospital admission in 2021 for worsening kidney function, found to have urinary retention, unknown etiology, entertaining suprapubic catheter, which has caused a lot of stress  Most recurrent seizure was on February 01, 2022, walking out of the bathroom, she suddenly went into a trance, confused, was cought by her family at a standing position   She also complains of slow worsening gait abnormality, denies significant upper or lower extremity paresthesia  Was diagnosed with obstructive sleep apnea in the past, last sleep study was 10 years ago, old CPAP machine is no longer function  UPDATE Jan 8th 2025: She is accompanied by longtime friends at today's visit, from seizure standpoint, doing well tolerating Keppra  500 mg twice a day, last seizure was more than a year ago,  She had a rough 2024, multiple ED presentation, hospital admission in July, had a superior pubic catheter for urinary retention, had recurrent UTI, later diagnosed with DVT of left lower extremity, now on Eliquis , she had worsening chronic kidney disease, Keppra  dosage was  decreased from previous once 1000 mg twice a day to current 500 mg twice a day, tolerating lower dose with no recurrent seizure  PHYSICAL EXAM:   Vitals:   05/08/23 0933 05/08/23 0939  BP: (!) 174/81 (!) 166/74  Pulse: (!) 56   Resp: 15   SpO2:  95%  Weight: 195 lb  8 oz (88.7 kg)   Height: 5' 1 (1.549 m)      Body mass index is 36.94 kg/m.  PHYSICAL EXAMNIATION:  Gen: NAD, conversant, well nourised, well groomed                     Cardiovascular: Regular rate rhythm, no peripheral edema, warm, nontender. Eyes: Conjunctivae clear without exudates or hemorrhage Neck: Supple, no carotid bruits. Pulmonary: Clear to auscultation bilaterally   NEUROLOGICAL EXAM:  MENTAL STATUS: Speech/cognition: Awake alert oriented, rely on her friend Donzell to provide history, hard of hearing, cooperative on examination CRANIAL NERVES: CN II:  Pupils are round equal and briskly reactive to light.  Right hemivisual field deficit CN III, IV, VI: extraocular movement are normal. No ptosis. CN V: Facial sensation is intact to light touch CN VII: Face is symmetric with normal eye closure  CN VIII: Hearing is normal to causal conversation. CN IX, X: Phonation is normal. CN XI: Head turning and shoulder shrug are intact  MOTOR: There is no pronator drift of out-stretched arms. Muscle bulk and tone are normal. Muscle strength is normal.  REFLEXES: Reflexes are 2+ and symmetric at the biceps, triceps, 3/3 knees, and ankles. Plantar responses are extensor bilaterally  SENSORY: Intact to light touch, pinprick and vibratory sensation are intact in fingers and toes.  COORDINATION: There is no trunk or limb dysmetria noted.  GAIT/STANCE: Need push-up to get up from seated position, wide-based, stiff, cautious, valgus knee  REVIEW OF SYSTEMS:  Full 14 system review of systems performed and notable only for as above All other review of systems were negative.   ALLERGIES: Allergies  Allergen Reactions   Bactrim  [Sulfamethoxazole -Trimethoprim ] Other (See Comments)    Hematuria, dysuria   Latex Rash   Penicillin G Itching   Atorvastatin  Other (See Comments)    Myalgias    Other Hives    Adhesive tape   Amlodipine  Swelling    Leg swelling   Doxycycline       Blood in urine    HOME MEDICATIONS: Current Outpatient Medications  Medication Sig Dispense Refill   aluminum -magnesium  hydroxide-simethicone  (MAALOX) 200-200-20 MG/5ML SUSP Take 30 mLs by mouth 4 (four) times daily -  before meals and at bedtime. 355 mL 0   apixaban  (ELIQUIS ) 5 MG TABS tablet Take 1 tablet (5 mg total) by mouth 2 (two) times daily. Please take this prescription once the eliquis  starter pack has been completed. 60 tablet 4   benzonatate  (TESSALON ) 100 MG capsule Take 1 capsule (100 mg total) by mouth 2 (two) times daily as needed for cough. 20 capsule 0   Cholecalciferol  (VITAMIN D3) 25 MCG (1000 UT) CAPS Take 1 capsule by mouth daily.     citalopram  (CELEXA ) 20 MG tablet TAKE ONE TABLET BY MOUTH ONCE A DAY 90 tablet 0   feeding supplement (ENSURE ENLIVE / ENSURE PLUS) LIQD Take 237 mLs by mouth 2 (two) times daily between meals. 237 mL 12   ferrous sulfate  325 (65 FE) MG tablet Take 325 mg by mouth daily with breakfast.     fluconazole  (DIFLUCAN ) 150 MG tablet Take 1 tablet (150 mg  total) by mouth every 3 (three) days as needed (for vaginal itching/yeast infection sx). 2 tablet 0   fluticasone  (FLONASE ) 50 MCG/ACT nasal spray Place 2 sprays into both nostrils daily as needed for allergies (allergies).     furosemide  (LASIX ) 40 MG tablet Take 1 tablet (40 mg total) by mouth daily. 90 tablet 3   guaiFENesin  (MUCINEX ) 600 MG 12 hr tablet Take 1 tablet (600 mg total) by mouth daily. 30 tablet 0   hydrALAZINE  (APRESOLINE ) 10 MG tablet Take 10 mg by mouth 3 (three) times daily.     levETIRAcetam  (KEPPRA ) 500 MG tablet Take 1 tablet (500 mg total) by mouth 2 (two) times daily. 180 tablet 1   loratadine  (CLARITIN ) 10 MG tablet TAKE 1 TABLET BY MOUTH ONCE A DAY AS NEEDED FOR ALLERGIES 30 tablet 3   metoCLOPramide  (REGLAN ) 10 MG tablet Take 1 tablet (10 mg total) by mouth every 6 (six) hours as needed. 30 tablet 0   Multiple Vitamin (MULTIVITAMIN WITH MINERALS) TABS tablet Take 1 tablet  by mouth daily. 30 tablet 1   nitroGLYCERIN  (NITROSTAT ) 0.4 MG SL tablet Place 1 tablet (0.4 mg total) under the tongue every 5 (five) minutes as needed for chest pain. 30 tablet 12   nystatin  (MYCOSTATIN /NYSTOP ) powder Apply 1 Application topically 3 (three) times daily. 60 g 1   ondansetron  (ZOFRAN -ODT) 4 MG disintegrating tablet Take 1 tablet (4 mg total) by mouth every 8 (eight) hours as needed. 20 tablet 0   pantoprazole  (PROTONIX ) 20 MG tablet TAKE 1 TABLET BY MOUTH EVERY MORNING 90 tablet 0   polyethylene glycol (MIRALAX  / GLYCOLAX ) 17 g packet Take 17 g by mouth daily. 14 each 0   traMADol  (ULTRAM ) 50 MG tablet Take 1 tablet (50 mg total) by mouth every 12 (twelve) hours as needed. 15 tablet 0   No current facility-administered medications for this visit.    PAST MEDICAL HISTORY: Past Medical History:  Diagnosis Date   (HFpEF) heart failure with preserved ejection fraction (HCC)    a. Pt reports in 2000 she had CHF, details unclear, outside hospital; b. 06/2019 Echo: EF 50-55%, no rwma, Gr2 DD,  mildly dil LA, mild MR.   Acute left PCA stroke (HCC) 06/22/2014   Anxiety    a.) on BZO (alprazolam ) PRN   CAD (coronary artery disease)    a. Pt reports in 2000 she had a stent to a coronary artery, details unclear, outside hospital.   Carotid stenosis    a. CT angio head 07/2014 - 50-60% stenoses of prox ICA bilaterally.   CKD (chronic kidney disease), stage IV (HCC)    Depression    Diabetes mellitus (HCC)    Denies.   Edentulous    No upper teeth   Family hx of colon cancer 06/13/2018   Father diagnosed in his 20's   Gout    History of cataract extraction, left 03/21/2021   HOH (hard of hearing)    usually needs to read lips   Hyperlipidemia    Hypertension    Intellectual disability    a.) born premature, limited formal education, has never driven   Myalgia due to statin    Orthostatic hypotension    OSA (obstructive sleep apnea)    a.) not on nocturnal PAP therapy;  old/nonfuntioning DME - referred for repeat PSG 01/2022 by neurology   Osteopenia 01/24/2018   DEXA Sept 2019   PONV (postoperative nausea and vomiting)    PTSD (post-traumatic stress disorder)  PUD (peptic ulcer disease)    PVC's (premature ventricular contractions)    Right sided weakness 05/2014   a.) s/p CVA   Seizures (HCC)    Sinus bradycardia    Syncope    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)     PAST SURGICAL HISTORY: Past Surgical History:  Procedure Laterality Date   BLADDER SURGERY     CATARACT EXTRACTION W/PHACO Left 03/21/2021   Procedure: CATARACT EXTRACTION PHACO AND INTRAOCULAR LENS PLACEMENT (IOC) left 5.86 00:38.0;  Surgeon: Jaye Fallow, MD;  Location: [facility];  Service: Ophthalmology;  Laterality: Left;  Latex   CESAREAN SECTION     CORONARY ANGIOPLASTY WITH STENT PLACEMENT     CYSTOSCOPY N/A 03/12/2022   Procedure: CYSTOSCOPY;  Surgeon: Penne Knee, MD;  Location: [facility];  Service: Urology;  Laterality: N/A;   DENTAL SURGERY     INSERTION OF SUPRAPUBIC CATHETER N/A 03/12/2022   Procedure: INSERTION OF SUPRAPUBIC CATHETER;  Surgeon: Penne Knee, MD;  Location: [facility];  Service: Urology;  Laterality: N/A;   LAPAROTOMY N/A 03/22/2022   Procedure: EXPLORATORY LAPAROTOMY; LYSIS OF ADHESIONS;  Surgeon: Tye Millet, DO;  Location: [facility];  Service: General;  Laterality: N/A;   RECTAL EXAM UNDER ANESTHESIA N/A 03/12/2022   Procedure: PELVIC EXAM UNDER ANESTHESIA;  Surgeon: Penne Knee, MD;  Location: [facility];  Service: Urology;  Laterality: N/A;    FAMILY HISTORY: Family History  Problem Relation Age of Onset   Stroke Mother    Heart attack Mother    Lung cancer Mother    Stroke Father    Prostate cancer Father    Colon cancer Father    Diabetes Mellitus II Daughter    Multiple sclerosis Daughter    Cancer Paternal Grandfather    Kidney disease Son    Heart failure Son    Breast cancer Neg Hx      SOCIAL HISTORY: Social History   Socioeconomic History   Marital status: Single    Spouse name: Not on file   Number of children: 2   Years of education: 12   Highest education level: Not on file  Occupational History   Occupation: Disabled  Tobacco Use   Smoking status: Former    Current packs/day: 0.00    Types: Cigarettes    Quit date: 03/30/2014    Years since quitting: 9.1    Passive exposure: Past   Smokeless tobacco: Never  Vaping Use   Vaping status: Never Used  Substance and Sexual Activity   Alcohol use: No   Drug use: No   Sexual activity: Not Currently  Other Topics Concern   Not on file  Social History Narrative   Lives at home with her son's father.   Right-handed.   1 cup coffee per day.   Social Drivers of Corporate Investment Banker Strain: Low Risk  (10/12/2022)   Overall Financial Resource Strain (CARDIA)    Difficulty of Paying Living Expenses: Not hard at all  Food Insecurity: No Food Insecurity (11/14/2022)   Hunger Vital Sign    Worried About Running Out of Food in the Last Year: Never true    Ran Out of Food in the Last Year: Never true  Recent Concern: Food Insecurity - Food Insecurity Present (10/12/2022)   Hunger Vital Sign    Worried About Running Out of Food in the Last Year: Never true    Ran Out of Food in the Last Year: Sometimes true  Transportation  Needs: No Transportation Needs (11/14/2022)   PRAPARE - Administrator, Civil Service (Medical): No    Lack of Transportation (Non-Medical): No  Physical Activity: Insufficiently Active (10/12/2022)   Exercise Vital Sign    Days of Exercise per Week: 4 days    Minutes of Exercise per Session: 30 min  Stress: No Stress Concern Present (10/10/2021)   [employer] of Occupational Health - Occupational Stress Questionnaire    Feeling of Stress : Only a little  Social Connections: Socially Isolated (10/12/2022)   Social Connection and Isolation Panel [NHANES]    Frequency  of Communication with Friends and Family: More than three times a week    Frequency of Social Gatherings with Friends and Family: Three times a week    Attends Religious Services: Never    Active Member of Clubs or Organizations: No    Attends Banker Meetings: Never    Marital Status: Divorced  Catering Manager Violence: Not At Risk (11/14/2022)   Humiliation, Afraid, Rape, and Kick questionnaire    Fear of Current or Ex-Partner: No    Emotionally Abused: No    Physically Abused: No    Sexually Abused: No      Modena Callander, M.D. Ph.D.  [facility] [address] [place], [place] [address] Ph: [phone] Fax: [phone]  CC:  Bernardo Fend, DO [address] [place],  [place] [address]  Bernardo Fend, DO

## 2023-05-09 ENCOUNTER — Ambulatory Visit (INDEPENDENT_AMBULATORY_CARE_PROVIDER_SITE_OTHER): Payer: Medicare Other | Admitting: Physician Assistant

## 2023-05-09 ENCOUNTER — Other Ambulatory Visit: Payer: Self-pay | Admitting: Internal Medicine

## 2023-05-09 ENCOUNTER — Encounter: Payer: Self-pay | Admitting: Physician Assistant

## 2023-05-09 VITALS — Ht 61.0 in | Wt 195.0 lb

## 2023-05-09 DIAGNOSIS — R339 Retention of urine, unspecified: Secondary | ICD-10-CM | POA: Diagnosis not present

## 2023-05-09 DIAGNOSIS — F331 Major depressive disorder, recurrent, moderate: Secondary | ICD-10-CM

## 2023-05-09 DIAGNOSIS — K219 Gastro-esophageal reflux disease without esophagitis: Secondary | ICD-10-CM

## 2023-05-09 DIAGNOSIS — Z435 Encounter for attention to cystostomy: Secondary | ICD-10-CM

## 2023-05-09 NOTE — Progress Notes (Addendum)
 Suprapubic Cath Change  Patient is present today for a suprapubic catheter change due to urinary retention.  8ml of water  was drained from the balloon, a 16FR Silastic foley cath was removed from the tract without difficulty.  Site was cleaned and prepped in a sterile fashion with betadine.  A 16FR foley cath was replaced into the tract no complications were noted. Urine return was noted, 10 ml of sterile water  was inflated into the balloon and a night bag was attached for drainage.  Patient tolerated well.     Performed by: Alverna Fawley, PA-C   Follow up: Return in about 4 weeks (around 06/06/2023) for SPT exchange.    ADDENDUM: Patient continues to require incontinence supplies due to urinary leakage from her urethra despite suprapubic catheter in place.

## 2023-05-13 ENCOUNTER — Ambulatory Visit: Payer: Medicare Other | Admitting: Physician Assistant

## 2023-05-13 NOTE — Telephone Encounter (Signed)
 Requested Prescriptions  Pending Prescriptions Disp Refills   pantoprazole  (PROTONIX ) 20 MG tablet [Pharmacy Med Name: PANTOPRAZOLE  SOD DR 20 MG TAB] 90 tablet 0    Sig: TAKE 1 TABLET BY MOUTH EVERY MORNING     Gastroenterology: Proton Pump Inhibitors Passed - 05/13/2023 10:55 AM      Passed - Valid encounter within last 12 months    Recent Outpatient Visits           1 month ago Essential hypertension   [facility] Bernardo Fend, DO   5 months ago Hospital discharge follow-up   [facility] Bernardo Fend, DO   5 months ago Lower abdominal pain   [facility] Leavy Mole, PA-C   10 months ago Acute cough   [facility] Bernardo Fend, DO   1 year ago Hospital discharge follow-up   [facility] Bernardo Fend, DO       Future Appointments             In 4 weeks Maurine Lukes, PA-C [facility] [place]   In 1 month Bernardo Fend, DO [facility]

## 2023-05-13 NOTE — Telephone Encounter (Signed)
 Requested Prescriptions  Pending Prescriptions Disp Refills   citalopram  (CELEXA ) 20 MG tablet [Pharmacy Med Name: CITALOPRAM  HYDROBROMIDE 20MG  TABLET] 90 tablet 1    Sig: TAKE ONE TABLET BY MOUTH ONCE A DAY     Psychiatry:  Antidepressants - SSRI Passed - 05/13/2023 10:52 AM      Passed - Completed PHQ-2 or PHQ-9 in the last 360 days      Passed - Valid encounter within last 6 months    Recent Outpatient Visits           1 month ago Essential hypertension   [facility] Bernardo Fend, DO   5 months ago Hospital discharge follow-up   [facility] Bernardo Fend, DO   5 months ago Lower abdominal pain   [facility] Leavy Mole, PA-C   10 months ago Acute cough   [facility] Bernardo Fend, DO   1 year ago Hospital discharge follow-up   [facility] Bernardo Fend, DO       Future Appointments             In 4 weeks Maurine Lukes, PA-C [facility] [place]   In 1 month Bernardo Fend, DO [facility], [facility]

## 2023-05-14 ENCOUNTER — Telehealth: Payer: Self-pay | Admitting: Medical

## 2023-05-14 ENCOUNTER — Ambulatory Visit: Payer: Self-pay

## 2023-05-14 DIAGNOSIS — I82402 Acute embolism and thrombosis of unspecified deep veins of left lower extremity: Secondary | ICD-10-CM

## 2023-05-14 NOTE — Telephone Encounter (Signed)
 Chief Complaint: Medication Question   Disposition: [] ED /[] Urgent Care (no appt availability in office) / [] Appointment(In office/virtual)/ []  [facility]/ [x] Home Care/ [] Refused Recommended Disposition /[] [facility]/ []  Follow-up with PCP Additional Notes: Spoke to patient's daughter Gwenlyn (signed DPR on file). Gwenlyn is asking who should she contact to receive a refill for the patient's Eliquis  5 MG Rx or find out if a refill is needed. Patient states she is unfamiliar with prescriber. Care advice was given and notified Gwenlyn that the Rx was ordered by Dr. Davia in ED at [facility] on 11/13/22. Advised patient to call the patient's cardiology office to request a refill or find out if a refill is needed at this time. Gwenlyn verbalized understanding and stated she will call today.   Summary: rx concern   The patient's daughter would like to speak with a member of clinical staff when possible about cessation of the patient's prescription for apixaban  (ELIQUIS ) 5 MG TABS tablet [[id number]]  The patient's daughter is uncertain of who the original prescriber of the medication is (Dr. Davia) and would like to discuss their concerns related to the prescription  Please contact the patient's family further when available     Reason for Disposition  [1] Caller has medicine question about med NOT prescribed by PCP AND [2] triager unable to answer question (e.g., compatibility with other med, storage)  Answer Assessment - Initial Assessment Questions 1. NAME of MEDICINE: What medicine(s) are you calling about?     apixaban  (ELIQUIS ) 5 MG TABS tablet 2. QUESTION: What is your question? (e.g., double dose of medicine, side effect)     Who do I call to get this medication refilled or find out if it needs to be refilled?  3. PRESCRIBER: Who prescribed the medicine? Reason: if prescribed by specialist, call should be referred to that group.     Dr. Davia  Protocols used:  Medication Question Call-A-AH

## 2023-05-14 NOTE — Telephone Encounter (Signed)
  Pt c/o medication issue:  1. Name of Medication:   apixaban  (ELIQUIS ) 5 MG TABS tablet    2. How are you currently taking this medication (dosage and times per day)? As written   3. Are you having a reaction (difficulty breathing--STAT)? No   4. What is your medication issue? Pt daughter called in stating pt needs a refill on this med but they were told she only needed to be on for 6 months. She asked when is she supposed to stop.   If she still needs to continue, she asked that refill be sent to [facility] [address] [address]

## 2023-05-16 NOTE — Telephone Encounter (Signed)
The patient's daughter called, expressing concern as the patient will run out of her Eliquis on Sunday. She mentioned she had been trying to get a refill but was told that the patient only needed to take the medication for six months. The daughter requested confirmation on how long the patient needs to take it, and if she needs to continue, a refill will be required.  Will forward to Cadence for recommendations.

## 2023-05-17 NOTE — Telephone Encounter (Signed)
Patient's daughter was informed that after the patient completes the dose on Sunday, it is okay to stop Eliquis. However, Cadence would like to repeat the study.  Order placed and message sent to scheduling.    Furth, Cadence H, PA-C  You1 hour ago (8:08 AM)    Yes, 6 months. We can repeat a DVT study as well.

## 2023-05-21 ENCOUNTER — Ambulatory Visit: Payer: Medicare Other

## 2023-05-28 ENCOUNTER — Ambulatory Visit
Admission: RE | Admit: 2023-05-28 | Discharge: 2023-05-28 | Disposition: A | Discharge status: Home / Self Care | Payer: Medicare Other | Source: Ambulatory Visit | Attending: Medical | Admitting: Medical

## 2023-05-28 DIAGNOSIS — I82402 Acute embolism and thrombosis of unspecified deep veins of left lower extremity: Secondary | ICD-10-CM | POA: Insufficient documentation

## 2023-05-28 DIAGNOSIS — Z86718 Personal history of other venous thrombosis and embolism: Secondary | ICD-10-CM | POA: Diagnosis not present

## 2023-06-07 ENCOUNTER — Telehealth: Payer: Self-pay | Admitting: Internal Medicine

## 2023-06-07 NOTE — Telephone Encounter (Signed)
 Copied from CRM [id number]. Topic: General - Other >> Jun 07, 2023 10:23 AM Rosaria BRAVO wrote: Reason for CRM: Garrel from [facility] called reporting that they are missing office notes   Fax: [phone]  Best contact: [phone]  Calling from [facility] in [place]   ..... Caller seems suspicious, please investigate prior to sending any records.

## 2023-06-07 NOTE — Telephone Encounter (Signed)
 Will wait on record request with patient signature

## 2023-06-10 ENCOUNTER — Ambulatory Visit (INDEPENDENT_AMBULATORY_CARE_PROVIDER_SITE_OTHER): Payer: Medicare Other | Admitting: Physician Assistant

## 2023-06-10 VITALS — BP 152/83 | HR 60 | Ht 61.0 in | Wt 198.4 lb

## 2023-06-10 DIAGNOSIS — R339 Retention of urine, unspecified: Secondary | ICD-10-CM

## 2023-06-10 DIAGNOSIS — Z435 Encounter for attention to cystostomy: Secondary | ICD-10-CM

## 2023-06-10 NOTE — Progress Notes (Signed)
 Suprapubic Cath Change  Patient is present today for a suprapubic catheter change due to urinary retention.  8ml of water  was drained from the balloon, a 16FR foley cath was removed from the tract with out difficulty.  Site was cleaned and prepped in a sterile fashion with betadine.  A 16FR foley cath was replaced into the tract no complications were noted. Urine return was noted, 10 ml of sterile water  was inflated into the balloon and a night bag was attached for drainage.  Patient tolerated well.  Performed by: Briannon Boggio, PA-C   Follow up: Return in about 4 weeks (around 07/08/2023) for SPT exchange.

## 2023-06-25 ENCOUNTER — Ambulatory Visit (INDEPENDENT_AMBULATORY_CARE_PROVIDER_SITE_OTHER): Payer: Medicare Other | Admitting: Internal Medicine

## 2023-06-25 ENCOUNTER — Other Ambulatory Visit: Payer: Self-pay

## 2023-06-25 ENCOUNTER — Encounter: Payer: Self-pay | Admitting: Internal Medicine

## 2023-06-25 VITALS — BP 130/72 | HR 68 | Temp 98.1°F | Resp 16 | Ht 61.0 in | Wt 195.9 lb

## 2023-06-25 DIAGNOSIS — I10 Essential (primary) hypertension: Secondary | ICD-10-CM

## 2023-06-25 DIAGNOSIS — E559 Vitamin D deficiency, unspecified: Secondary | ICD-10-CM

## 2023-06-25 DIAGNOSIS — E119 Type 2 diabetes mellitus without complications: Secondary | ICD-10-CM

## 2023-06-25 DIAGNOSIS — Z8673 Personal history of transient ischemic attack (TIA), and cerebral infarction without residual deficits: Secondary | ICD-10-CM

## 2023-06-25 DIAGNOSIS — I2510 Atherosclerotic heart disease of native coronary artery without angina pectoris: Secondary | ICD-10-CM

## 2023-06-25 DIAGNOSIS — E1169 Type 2 diabetes mellitus with other specified complication: Secondary | ICD-10-CM | POA: Diagnosis not present

## 2023-06-25 DIAGNOSIS — F419 Anxiety disorder, unspecified: Secondary | ICD-10-CM

## 2023-06-25 DIAGNOSIS — Z86718 Personal history of other venous thrombosis and embolism: Secondary | ICD-10-CM | POA: Insufficient documentation

## 2023-06-25 MED ORDER — CITALOPRAM HYDROBROMIDE 20 MG PO TABS
20.0000 mg | ORAL_TABLET | Freq: Every day | ORAL | 1 refills | Status: DC
Start: 2023-06-25 — End: 2023-12-23

## 2023-06-25 NOTE — Progress Notes (Signed)
 Established Patient Office Visit  Subjective:  Patient ID: Leslie Houston, female    DOB: 1951-02-07  Age: 73 y.o. MRN: [id number]  CC:  Chief Complaint  Patient presents with   Medical Management of Chronic Issues    3 month follow up    HPI Leslie Houston presents for follow up on chronic medical conditions. She is here with her daughter.   Hypertension/CHF: -Medications: Hydralazine 10 TID, Torsemide switched to Lasix 40 mg daily by Cardiology -Patient is compliant with above medications and reports no side effects. -Denies any SOB, CP, vision changes, LE edema or symptoms of hypotension -Following with Cardiology, Dr. Mariah Milling -Last echo 9/24 EF 60 to 65% with no wall motion abnormalities  Hx of CVA/CAD/Hx of DVT: -Currently on Zetia and aspirin, statin intolerance history  -Had been on Eliquis after DVT last summer but she finished this back in January Lipid Panel     Component Value Date/Time   CHOL 181 12/05/2021 1358   TRIG 53 04/05/2022 0642   HDL 73 12/05/2021 1358   CHOLHDL 2.5 12/05/2021 1358   VLDL 9 11/19/2016 1141   LDLCALC 87 12/05/2021 1358   CKD4: -Last creatinine 7/24 2.42 -Following with Nephrology, last seen 05/10/22   Seizure Disorder: -Keppra decreased to 500 mg twice daily due to renal function -Following with Neurology  GERD: -Currently on Protonix 20 mg, symptoms well controlled.  -Avoiding trigger foods and laying on 3 pillows at night  -Denies nausea, vomiting or abdominal pain.  Weight stable appetite good  MDD: -Mood status: stable -Current treatment: back on the Celexa 20 mg at bedtime     03/19/2023   11:28 AM 11/30/2022   11:00 AM 11/23/2022   11:36 AM 10/12/2022    3:05 PM 07/02/2022    2:19 PM  Depression screen PHQ 2/9  Decreased Interest 0 0 0 0 0  Down, Depressed, Hopeless 0 0 0 0 0  PHQ - 2 Score 0 0 0 0 0  Altered sleeping 0 0 0  0  Tired, decreased energy 0 0 0  0  Change in appetite 0 0 0  0  Feeling bad or  failure about yourself  0 0 0  0  Trouble concentrating 0 0 0  0  Moving slowly or fidgety/restless 0 0 0  0  Suicidal thoughts 0 0 0  0  PHQ-9 Score 0 0 0  0  Difficult doing work/chores Not difficult at all Not difficult at all Not difficult at all     Controlled diabetes, Type 2: -Last A1c 4.8% 8/24 -Medications: Nothing  Arthritis:  -Having some pain in hands and knees, taking Tylenol but not well controlled.  Health Maintenance: -Blood work due -Mammogram up-to-date 10/24, BI-RADS 1  Past Medical History:  Diagnosis Date   (HFpEF) heart failure with preserved ejection fraction (HCC)    a. Pt reports in 2000 she had CHF, details unclear, outside hospital; b. 06/2019 Echo: EF 50-55%, no rwma, Gr2 DD,  mildly dil LA, mild MR.   Acute left PCA stroke (HCC) 06/22/2014   Anxiety    a.) on BZO (alprazolam) PRN   CAD (coronary artery disease)    a. Pt reports in 2000 she had a stent to a coronary artery, details unclear, outside hospital.   Carotid stenosis    a. CT angio head 07/2014 - 50-60% stenoses of prox ICA bilaterally.   CKD (chronic kidney disease), stage IV (HCC)    Depression    Diabetes mellitus (  HCC)    Denies.   Edentulous    No upper teeth   Family hx of colon cancer 06/13/2018   Father diagnosed in his 1's   Gout    History of cataract extraction, left 03/21/2021   HOH (hard of hearing)    usually needs to read lips   Hyperlipidemia    Hypertension    Intellectual disability    a.) born premature, limited formal education, has never driven   Myalgia due to statin    Orthostatic hypotension    OSA (obstructive sleep apnea)    a.) not on nocturnal PAP therapy; old/nonfuntioning DME - referred for repeat PSG 01/2022 by neurology   Osteopenia 01/24/2018   DEXA Sept 2019   PONV (postoperative nausea and vomiting)    PTSD (post-traumatic stress disorder)        PUD (peptic ulcer disease)    PVC's (premature ventricular contractions)    Right sided  weakness 05/2014   a.) s/p CVA   Seizures (HCC)    Sinus bradycardia    Syncope    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)     Past Surgical History:  Procedure Laterality Date   BLADDER SURGERY     CATARACT EXTRACTION W/PHACO Left 03/21/2021   Procedure: CATARACT EXTRACTION PHACO AND INTRAOCULAR LENS PLACEMENT (IOC) left 5.86 00:38.0;  Surgeon: Galen Manila, MD;  Location: [facility];  Service: Ophthalmology;  Laterality: Left;  Latex   CESAREAN SECTION     CORONARY ANGIOPLASTY WITH STENT PLACEMENT     CYSTOSCOPY N/A 03/12/2022   Procedure: CYSTOSCOPY;  Surgeon: Vanna Scotland, MD;  Location: [facility];  Service: Urology;  Laterality: N/A;   DENTAL SURGERY     INSERTION OF SUPRAPUBIC CATHETER N/A 03/12/2022   Procedure: INSERTION OF SUPRAPUBIC CATHETER;  Surgeon: Vanna Scotland, MD;  Location: [facility];  Service: Urology;  Laterality: N/A;   LAPAROTOMY N/A 03/22/2022   Procedure: EXPLORATORY LAPAROTOMY; LYSIS OF ADHESIONS;  Surgeon: Sung Amabile, DO;  Location: [facility];  Service: General;  Laterality: N/A;   RECTAL EXAM UNDER ANESTHESIA N/A 03/12/2022   Procedure: PELVIC EXAM UNDER ANESTHESIA;  Surgeon: Vanna Scotland, MD;  Location: [facility];  Service: Urology;  Laterality: N/A;    Family History  Problem Relation Age of Onset   Stroke Mother    Heart attack Mother    Lung cancer Mother    Stroke Father    Prostate cancer Father    Colon cancer Father    Diabetes Mellitus II Daughter    Multiple sclerosis Daughter    Cancer Paternal Grandfather    Kidney disease Son    Heart failure Son    Breast cancer Neg Hx     Social History   Socioeconomic History   Marital status: Single    Spouse name: Not on file   Number of children: 2   Years of education: 12   Highest education level: Not on file  Occupational History   Occupation: Disabled  Tobacco Use   Smoking status: Former    Current packs/day: 0.00    Types: Cigarettes     Quit date: 03/30/2014    Years since quitting: 9.2    Passive exposure: Past   Smokeless tobacco: Never  Vaping Use   Vaping status: Never Used  Substance and Sexual Activity   Alcohol use: No   Drug use: No   Sexual activity: Not Currently  Other Topics Concern   Not on file  Social  History Narrative   Lives at home with her son's father.   Right-handed.   1 cup coffee per day.   Social Drivers of Corporate investment banker Strain: Low Risk  (10/12/2022)   Overall Financial Resource Strain (CARDIA)    Difficulty of Paying Living Expenses: Not hard at all  Food Insecurity: No Food Insecurity (11/14/2022)   Hunger Vital Sign    Worried About Running Out of Food in the Last Year: Never true    Ran Out of Food in the Last Year: Never true  Recent Concern: Food Insecurity - Food Insecurity Present (10/12/2022)   Hunger Vital Sign    Worried About Running Out of Food in the Last Year: Never true    Ran Out of Food in the Last Year: Sometimes true  Transportation Needs: No Transportation Needs (11/14/2022)   PRAPARE - Administrator, Civil Service (Medical): No    Lack of Transportation (Non-Medical): No  Physical Activity: Insufficiently Active (10/12/2022)   Exercise Vital Sign    Days of Exercise per Week: 4 days    Minutes of Exercise per Session: 30 min  Stress: No Stress Concern Present (10/10/2021)   [employer] of Occupational Health - Occupational Stress Questionnaire    Feeling of Stress : Only a little  Social Connections: Socially Isolated (10/12/2022)   Social Connection and Isolation Panel [NHANES]    Frequency of Communication with Friends and Family: More than three times a week    Frequency of Social Gatherings with Friends and Family: Three times a week    Attends Religious Services: Never    Active Member of Clubs or Organizations: No    Attends Banker Meetings: Never    Marital Status: Divorced  Catering manager Violence: Not At  Risk (11/14/2022)   Humiliation, Afraid, Rape, and Kick questionnaire    Fear of Current or Ex-Partner: No    Emotionally Abused: No    Physically Abused: No    Sexually Abused: No    Outpatient Medications Prior to Visit  Medication Sig Dispense Refill   furosemide (LASIX) 40 MG tablet Take 1 tablet (40 mg total) by mouth daily. 90 tablet 3   hydrALAZINE (APRESOLINE) 10 MG tablet Take 10 mg by mouth 3 (three) times daily.     levETIRAcetam (KEPPRA) 500 MG tablet Take 1 tablet (500 mg total) by mouth 2 (two) times daily. 180 tablet 3   metoCLOPramide (REGLAN) 10 MG tablet Take 1 tablet (10 mg total) by mouth every 6 (six) hours as needed. 30 tablet 0   nitroGLYCERIN (NITROSTAT) 0.4 MG SL tablet Place 1 tablet (0.4 mg total) under the tongue every 5 (five) minutes as needed for chest pain. 30 tablet 12   pantoprazole (PROTONIX) 20 MG tablet TAKE 1 TABLET BY MOUTH EVERY MORNING 90 tablet 0   No facility-administered medications prior to visit.    Allergies  Allergen Reactions   Bactrim [Sulfamethoxazole-Trimethoprim] Other (See Comments)    Hematuria, dysuria   Latex Rash   Penicillin G Itching   Atorvastatin Other (See Comments)    Myalgias    Other Hives    Adhesive tape   Amlodipine Swelling    Leg swelling   Doxycycline     Blood in urine    ROS Review of Systems  All other systems reviewed and are negative.     Objective:    Physical Exam Constitutional:      Appearance: Normal appearance.  HENT:  Head: Normocephalic and atraumatic.  Eyes:     Conjunctiva/sclera: Conjunctivae normal.  Cardiovascular:     Rate and Rhythm: Normal rate and regular rhythm.  Pulmonary:     Effort: Pulmonary effort is normal.     Breath sounds: Normal breath sounds.  Skin:    General: Skin is warm and dry.     Comments: Abdominal scar healing well  Neurological:     General: No focal deficit present.     Mental Status: She is alert. Mental status is at baseline.   Psychiatric:        Mood and Affect: Mood normal.        Behavior: Behavior normal.     BP 130/72 (Cuff Size: Large)   Pulse 68   Temp 98.1 F (36.7 C) (Oral)   Resp 16   Ht 5\' 1"  (1.549 m)   Wt 195 lb 14.4 oz (88.9 kg)   SpO2 94%   BMI 37.01 kg/m  Wt Readings from Last 3 Encounters:  06/25/23 195 lb 14.4 oz (88.9 kg)  06/10/23 198 lb 6 oz (90 kg)  05/09/23 195 lb (88.5 kg)     Health Maintenance Due  Topic Date Due   Fecal DNA (Cologuard)  Never done   Zoster Vaccines- Shingrix (1 of 2) Never done   FOOT EXAM  01/22/2020   INFLUENZA VACCINE  11/29/2022   COVID-19 Vaccine (2 - 2024-25 season) 12/30/2022   Diabetic kidney evaluation - Urine ACR  05/10/2023   HEMOGLOBIN A1C  06/02/2023    There are no preventive care reminders to display for this patient.  Lab Results  Component Value Date   TSH 0.451 11/20/2022   Lab Results  Component Value Date   WBC 4.4 11/20/2022   HGB 12.3 11/20/2022   HCT 37.3 11/20/2022   MCV 99.2 11/20/2022   PLT 254 11/20/2022   Lab Results  Component Value Date   NA 135 11/20/2022   K 5.1 11/20/2022   CO2 19 (L) 11/20/2022   GLUCOSE 86 11/20/2022   BUN 46 (H) 11/20/2022   CREATININE 2.42 (H) 11/20/2022   BILITOT 1.0 11/20/2022   ALKPHOS 84 11/20/2022   AST 37 11/20/2022   ALT 41 11/20/2022   PROT 7.3 11/20/2022   ALBUMIN 3.4 (L) 11/20/2022   CALCIUM 8.8 (L) 11/20/2022   ANIONGAP 7 11/20/2022   EGFR 18 (L) 05/03/2022   Lab Results  Component Value Date   CHOL 181 12/05/2021   Lab Results  Component Value Date   HDL 73 12/05/2021   Lab Results  Component Value Date   LDLCALC 87 12/05/2021   Lab Results  Component Value Date   TRIG 53 04/05/2022   Lab Results  Component Value Date   CHOLHDL 2.5 12/05/2021   Lab Results  Component Value Date   HGBA1C 4.8 11/30/2022      Assessment & Plan:   Essential hypertension Assessment & Plan: Blood pressure well-controlled, no changes to medications made  today.  Orders: -     COMPLETE METABOLIC PANEL WITH GFR  Controlled type 2 diabetes mellitus without complication, without long-term current use of insulin (HCC) Assessment & Plan: A1c well-controlled last summer, will recheck again today.  Not on medications.  Orders: -     Hemoglobin A1c  History of CVA (cerebrovascular accident) Assessment & Plan: History of statin intolerance, on Zetia and aspirin.  Orders: -     Lipid panel  Coronary artery disease involving native coronary artery of native  heart without angina pectoris Assessment & Plan: On Zetia and aspirin, will recheck lipid panel today.  Orders: -     Lipid panel  History of DVT (deep vein thrombosis) Assessment & Plan: Completed Eliquis in January.   Anxiety Assessment & Plan: Stable, back on Celexa 20 mg at night and doing well.  Orders: -     Citalopram Hydrobromide; Take 1 tablet (20 mg total) by mouth daily.  Dispense: 90 tablet; Refill: 1  Vitamin D deficiency -     VITAMIN D 25 Hydroxy (Vit-D Deficiency, Fractures)  Recheck vitamin D levels, not currently on any supplements.  Follow-up: Return in about 6 months (around 12/23/2023).    [name], DO

## 2023-06-25 NOTE — Assessment & Plan Note (Signed)
 On Zetia and aspirin, will recheck lipid panel today.

## 2023-06-25 NOTE — Assessment & Plan Note (Signed)
 Stable, back on Celexa 20 mg at night and doing well.

## 2023-06-25 NOTE — Assessment & Plan Note (Signed)
 Completed Eliquis in January.

## 2023-06-25 NOTE — Assessment & Plan Note (Signed)
 A1c well-controlled last summer, will recheck again today.  Not on medications.

## 2023-06-25 NOTE — Assessment & Plan Note (Signed)
 Blood pressure well-controlled, no changes to medications made today.

## 2023-06-25 NOTE — Assessment & Plan Note (Signed)
 History of statin intolerance, on Zetia and aspirin.

## 2023-06-26 LAB — LIPID PANEL
Cholesterol: 194 mg/dL (ref <200)
HDL: 50 mg/dL (ref >=50)
LDL Cholesterol (Calc): 123 mg/dL — ABNORMAL HIGH
Non-HDL Cholesterol (Calc): 144 mg/dL — ABNORMAL HIGH (ref <130)
Total CHOL/HDL Ratio: 3.9 (calc) (ref <5.0)
Triglycerides: 100 mg/dL (ref <150)

## 2023-06-26 LAB — COMPLETE METABOLIC PANEL WITH GFR
AG Ratio: 1.2 (calc) (ref 1.0–2.5)
ALT: 6 U/L (ref 6–29)
AST: 12 U/L (ref 10–35)
Albumin: 4.1 g/dL (ref 3.6–5.1)
Alkaline phosphatase (APISO): 99 U/L (ref 37–153)
BUN/Creatinine Ratio: 10 (calc) (ref 6–22)
BUN: 27 mg/dL — ABNORMAL HIGH (ref 7–25)
CO2: 28 mmol/L (ref 20–32)
Calcium: 9.8 mg/dL (ref 8.6–10.4)
Chloride: 101 mmol/L (ref 98–110)
Creat: 2.63 mg/dL — ABNORMAL HIGH (ref 0.60–1.00)
Globulin: 3.5 g/dL (ref 1.9–3.7)
Glucose, Bld: 85 mg/dL (ref 65–99)
Potassium: 3.4 mmol/L — ABNORMAL LOW (ref 3.5–5.3)
Sodium: 142 mmol/L (ref 135–146)
Total Bilirubin: 0.5 mg/dL (ref 0.2–1.2)
Total Protein: 7.6 g/dL (ref 6.1–8.1)
eGFR: 19 mL/min/{1.73_m2} — ABNORMAL LOW (ref >=60)

## 2023-06-26 LAB — VITAMIN D 25 HYDROXY (VIT D DEFICIENCY, FRACTURES): Vit D, 25-Hydroxy: 36 ng/mL (ref 30–100)

## 2023-06-26 LAB — HEMOGLOBIN A1C
Hgb A1c MFr Bld: 5 %{Hb} (ref <5.7)
Mean Plasma Glucose: 97 mg/dL
eAG (mmol/L): 5.4 mmol/L

## 2023-07-08 ENCOUNTER — Ambulatory Visit: Payer: Medicare Other | Admitting: Physician Assistant

## 2023-07-08 ENCOUNTER — Ambulatory Visit (INDEPENDENT_AMBULATORY_CARE_PROVIDER_SITE_OTHER): Admitting: Physician Assistant

## 2023-07-08 VITALS — BP 176/87 | HR 60 | Ht 61.0 in | Wt 195.2 lb

## 2023-07-08 DIAGNOSIS — Z435 Encounter for attention to cystostomy: Secondary | ICD-10-CM

## 2023-07-08 DIAGNOSIS — N184 Chronic kidney disease, stage 4 (severe): Secondary | ICD-10-CM | POA: Diagnosis not present

## 2023-07-08 NOTE — Progress Notes (Signed)
 Suprapubic Cath Change  Patient is present today for a suprapubic catheter change due to urinary retention.  8ml of water was drained from the balloon, a 16FR foley cath was removed from the tract without difficulty.  Site was cleaned and prepped in a sterile fashion with betadine.  A 16FR foley cath was replaced into the tract no complications were noted. Urine return was noted, 10 ml of sterile water was inflated into the balloon and a night bag was attached for drainage.  Patient tolerated well.  Performed by: Carman Ching, PA-C   Additional notes: She recently had labs drawn by her PCP, which were notable for hypokalemia and worsening creatinine. They have been making some lifestyle changes and request repeat labs today. Will obtain BMP and call with results.  Follow up: Return in about 4 weeks (around 08/05/2023) for SPT exchange, Will call with results.

## 2023-07-09 LAB — BASIC METABOLIC PANEL
BUN/Creatinine Ratio: 11 — ABNORMAL LOW (ref 12–28)
BUN: 27 mg/dL (ref 8–27)
CO2: 22 mmol/L (ref 20–29)
Calcium: 9.7 mg/dL (ref 8.7–10.3)
Chloride: 102 mmol/L (ref 96–106)
Creatinine, Ser: 2.5 mg/dL — ABNORMAL HIGH (ref 0.57–1.00)
Glucose: 82 mg/dL (ref 70–99)
Potassium: 3.8 mmol/L (ref 3.5–5.2)
Sodium: 142 mmol/L (ref 134–144)
eGFR: 20 mL/min/{1.73_m2} — ABNORMAL LOW (ref >59)

## 2023-08-05 ENCOUNTER — Encounter: Payer: Self-pay | Admitting: Physician Assistant

## 2023-08-05 ENCOUNTER — Ambulatory Visit (INDEPENDENT_AMBULATORY_CARE_PROVIDER_SITE_OTHER): Admitting: Physician Assistant

## 2023-08-05 VITALS — BP 149/75 | HR 55 | Ht 61.0 in | Wt 194.1 lb

## 2023-08-05 DIAGNOSIS — Z435 Encounter for attention to cystostomy: Secondary | ICD-10-CM

## 2023-08-05 NOTE — Progress Notes (Signed)
 Suprapubic Cath Change  Patient is present today for a suprapubic catheter change due to urinary retention.  8ml of water was drained from the balloon, a 16FR foley cath was removed from the tract with out difficulty.  Site was cleaned and prepped in a sterile fashion with betadine.  A 16FR foley cath was replaced into the tract no complications were noted. Urine return was noted, 10 ml of sterile water was inflated into the balloon and a night bag was attached for drainage.  Patient tolerated well.   Performed by: Carman Ching, PA-C   Follow up: Return in about 4 weeks (around 09/02/2023) for SPT exchange.

## 2023-08-08 ENCOUNTER — Other Ambulatory Visit: Payer: Self-pay | Admitting: Internal Medicine

## 2023-08-08 DIAGNOSIS — K219 Gastro-esophageal reflux disease without esophagitis: Secondary | ICD-10-CM

## 2023-08-08 NOTE — Telephone Encounter (Signed)
 Requested Prescriptions  Pending Prescriptions Disp Refills   pantoprazole (PROTONIX) 20 MG tablet [Pharmacy Med Name: PANTOPRAZOLE SOD DR 20 MG TAB] 90 tablet 1    Sig: TAKE 1 TABLET BY MOUTH EVERY MORNING     Gastroenterology: Proton Pump Inhibitors Passed - 08/08/2023  2:43 PM      Passed - Valid encounter within last 12 months    Recent Outpatient Visits           1 month ago Essential hypertension   [facility] [name], DO       Future Appointments             In 4 months [name], DO [facility]   In 12 months Carman Ching, [facility] [place]

## 2023-09-06 ENCOUNTER — Ambulatory Visit (INDEPENDENT_AMBULATORY_CARE_PROVIDER_SITE_OTHER): Admitting: Physician Assistant

## 2023-09-06 ENCOUNTER — Encounter: Payer: Self-pay | Admitting: Physician Assistant

## 2023-09-06 VITALS — BP 142/79 | HR 73 | Ht 61.0 in | Wt 199.6 lb

## 2023-09-06 DIAGNOSIS — Z435 Encounter for attention to cystostomy: Secondary | ICD-10-CM | POA: Diagnosis not present

## 2023-09-06 DIAGNOSIS — N184 Chronic kidney disease, stage 4 (severe): Secondary | ICD-10-CM

## 2023-09-06 DIAGNOSIS — N3289 Other specified disorders of bladder: Secondary | ICD-10-CM | POA: Diagnosis not present

## 2023-09-06 NOTE — Progress Notes (Signed)
 Suprapubic Cath Change  Patient is present today for a suprapubic catheter change due to urinary retention.  8ml of water  was drained from the balloon, a 16FR foley cath was removed from the tract with out difficulty.  Site was cleaned and prepped in a sterile fashion with betadine.  A 16FR foley cath was replaced into the tract no complications were noted. Urine return was noted, 10 ml of sterile water  was inflated into the balloon and a night bag was attached for drainage.  Patient tolerated well.  Performed by: Onetta Spainhower, PA-C   Follow up: Return in about 4 weeks (around 10/04/2023) for SPT exchange.

## 2023-09-07 LAB — BASIC METABOLIC PANEL WITH GFR
BUN/Creatinine Ratio: 13 (ref 12–28)
BUN: 33 mg/dL — ABNORMAL HIGH (ref 8–27)
CO2: 18 mmol/L — ABNORMAL LOW (ref 20–29)
Calcium: 9.8 mg/dL (ref 8.7–10.3)
Chloride: 104 mmol/L (ref 96–106)
Creatinine, Ser: 2.52 mg/dL — ABNORMAL HIGH (ref 0.57–1.00)
Glucose: 78 mg/dL (ref 70–99)
Potassium: 4.4 mmol/L (ref 3.5–5.2)
Sodium: 141 mmol/L (ref 134–144)
eGFR: 19 mL/min/{1.73_m2} — ABNORMAL LOW (ref >59)

## 2023-10-07 ENCOUNTER — Encounter: Payer: Self-pay | Admitting: Physician Assistant

## 2023-10-07 ENCOUNTER — Ambulatory Visit (INDEPENDENT_AMBULATORY_CARE_PROVIDER_SITE_OTHER): Admitting: Physician Assistant

## 2023-10-07 VITALS — BP 152/77 | HR 60 | Ht 61.0 in | Wt 200.4 lb

## 2023-10-07 DIAGNOSIS — Z435 Encounter for attention to cystostomy: Secondary | ICD-10-CM | POA: Diagnosis not present

## 2023-10-07 NOTE — Progress Notes (Signed)
 Suprapubic Cath Change  Patient is present today for a suprapubic catheter change due to urinary retention.  8ml of water  was drained from the balloon, a 16FR foley cath was removed from the tract with out difficulty.  Site was cleaned and prepped in a sterile fashion with betadine.  A 16FR foley cath was replaced into the tract no complications were noted. Urine return was noted, 10 ml of sterile water  was inflated into the balloon and a leg bag was attached for drainage.  Patient tolerated well.  Performed by: Jamita Mckelvin, PA-C   Follow up: Return in about 4 weeks (around 11/04/2023) for SPT exchange.

## 2023-10-24 ENCOUNTER — Ambulatory Visit: Payer: Medicare Other

## 2023-10-24 DIAGNOSIS — Z0000 Encounter for general adult medical examination without abnormal findings: Secondary | ICD-10-CM

## 2023-10-24 NOTE — Progress Notes (Signed)
 Subjective:   Leslie Houston is a 73 y.o. who presents for a Medicare Wellness preventive visit.  As a reminder, Annual Wellness Visits don't include a physical exam, and some assessments may be limited, especially if this visit is performed virtually. We may recommend an in-person follow-up visit with your provider if needed.  Visit Complete: Virtual I connected with  Leslie Houston on 10/24/23 by a audio enabled telemedicine application and verified that I am speaking with the correct person using two identifiers.  Patient Location: Home  Provider Location: Home Office  I discussed the limitations of evaluation and management by telemedicine. The patient expressed understanding and agreed to proceed.  Vital Signs: Because this visit was a virtual/telehealth visit, some criteria may be missing or patient reported. Any vitals not documented were not able to be obtained and vitals that have been documented are patient reported.  VideoDeclined- This patient declined Librarian, academic. Therefore the visit was completed with audio only.  Persons Participating in Visit: Patient.  AWV Questionnaire: No: Patient Medicare AWV questionnaire was not completed prior to this visit.  Cardiac Risk Factors include: advanced age (>13men, >36 women);hypertension;obesity (BMI >30kg/m2)     Objective:    There were no vitals filed for this visit. There is no height or weight on file to calculate BMI.     10/24/2023    3:34 PM 02/08/2023    6:48 PM 11/20/2022    1:14 PM 11/14/2022   11:00 AM 11/13/2022    2:38 PM 10/12/2022    3:10 PM 09/11/2022    1:21 PM  Advanced Directives  Does Patient Have a Medical Advance Directive? No No No No No No No  Would patient like information on creating a medical advance directive? No - Patient declined   No - Patient declined   No - Patient declined    Current Medications (verified) Outpatient Encounter Medications as of  10/24/2023  Medication Sig   cholecalciferol  (VITAMIN D3) 25 MCG (1000 UNIT) tablet Take 1,000 Units by mouth daily.   citalopram  (CELEXA ) 20 MG tablet Take 1 tablet (20 mg total) by mouth daily.   furosemide  (LASIX ) 40 MG tablet Take 1 tablet (40 mg total) by mouth daily.   hydrALAZINE  (APRESOLINE ) 10 MG tablet Take 10 mg by mouth 3 (three) times daily.   levETIRAcetam  (KEPPRA ) 500 MG tablet Take 1 tablet (500 mg total) by mouth 2 (two) times daily.   nitroGLYCERIN  (NITROSTAT ) 0.4 MG SL tablet Place 1 tablet (0.4 mg total) under the tongue every 5 (five) minutes as needed for chest pain.   pantoprazole  (PROTONIX ) 20 MG tablet TAKE 1 TABLET BY MOUTH EVERY MORNING   metoCLOPramide  (REGLAN ) 10 MG tablet Take 1 tablet (10 mg total) by mouth every 6 (six) hours as needed. (Patient not taking: Reported on 10/24/2023)   No facility-administered encounter medications on file as of 10/24/2023.    Allergies (verified) Bactrim  [sulfamethoxazole -trimethoprim ], Latex, Penicillin g, Atorvastatin , Other, Amlodipine , and Doxycycline    History: Past Medical History:  Diagnosis Date   (HFpEF) heart failure with preserved ejection fraction (HCC)    a. Pt reports in 2000 she had CHF, details unclear, outside hospital; b. 06/2019 Echo: EF 50-55%, no rwma, Gr2 DD,  mildly dil LA, mild MR.   Acute left PCA stroke (HCC) 06/22/2014   Anxiety    a.) on BZO (alprazolam ) PRN   CAD (coronary artery disease)    a. Pt reports in 2000 she had a stent to a coronary  artery, details unclear, outside hospital.   Carotid stenosis    a. CT angio head 07/2014 - 50-60% stenoses of prox ICA bilaterally.   CKD (chronic kidney disease), stage IV (HCC)    Depression    Diabetes mellitus (HCC)    Denies.   Edentulous    No upper teeth   Family hx of colon cancer 06/13/2018   Father diagnosed in his 24's   Gout    History of cataract extraction, left 03/21/2021   HOH (hard of hearing)    usually needs to read lips    Hyperlipidemia    Hypertension    Intellectual disability    a.) born premature, limited formal education, has never driven   Myalgia due to statin    Orthostatic hypotension    OSA (obstructive sleep apnea)    a.) not on nocturnal PAP therapy; old/nonfuntioning DME - referred for repeat PSG 01/2022 by neurology   Osteopenia 01/24/2018   DEXA Sept 2019   PONV (postoperative nausea and vomiting)    PTSD (post-traumatic stress disorder)        PUD (peptic ulcer disease)    PVC's (premature ventricular contractions)    Right sided weakness 05/2014   a.) s/p CVA   Seizures (HCC)    Sinus bradycardia    Syncope    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)    Past Surgical History:  Procedure Laterality Date   BLADDER SURGERY     CATARACT EXTRACTION W/PHACO Left 03/21/2021   Procedure: CATARACT EXTRACTION PHACO AND INTRAOCULAR LENS PLACEMENT (IOC) left 5.86 00:38.0;  Surgeon: Jaye Fallow, MD;  Location: [facility];  Service: Ophthalmology;  Laterality: Left;  Latex   CESAREAN SECTION     CORONARY ANGIOPLASTY WITH STENT PLACEMENT     CYSTOSCOPY N/A 03/12/2022   Procedure: CYSTOSCOPY;  Surgeon: Penne Knee, MD;  Location: [facility];  Service: Urology;  Laterality: N/A;   DENTAL SURGERY     INSERTION OF SUPRAPUBIC CATHETER N/A 03/12/2022   Procedure: INSERTION OF SUPRAPUBIC CATHETER;  Surgeon: Penne Knee, MD;  Location: [facility];  Service: Urology;  Laterality: N/A;   LAPAROTOMY N/A 03/22/2022   Procedure: EXPLORATORY LAPAROTOMY; LYSIS OF ADHESIONS;  Surgeon: Tye Millet, DO;  Location: [facility];  Service: General;  Laterality: N/A;   RECTAL EXAM UNDER ANESTHESIA N/A 03/12/2022   Procedure: PELVIC EXAM UNDER ANESTHESIA;  Surgeon: Penne Knee, MD;  Location: [facility];  Service: Urology;  Laterality: N/A;   Family History  Problem Relation Age of Onset   Stroke Mother    Heart attack Mother    Lung cancer Mother    Stroke Father    Prostate  cancer Father    Colon cancer Father    Diabetes Mellitus II Daughter    Multiple sclerosis Daughter    Cancer Paternal Grandfather    Kidney disease Son    Heart failure Son    Breast cancer Neg Hx    Social History   Socioeconomic History   Marital status: Single    Spouse name: Not on file   Number of children: 2   Years of education: 12   Highest education level: Not on file  Occupational History   Occupation: Disabled  Tobacco Use   Smoking status: Former    Current packs/day: 0.00    Types: Cigarettes    Quit date: 03/30/2014    Years since quitting: 9.5    Passive exposure: Past   Smokeless tobacco: Never  Vaping  Use   Vaping status: Never Used  Substance and Sexual Activity   Alcohol use: No   Drug use: No   Sexual activity: Not Currently  Other Topics Concern   Not on file  Social History Narrative   Lives at home with her son's father.   Right-handed.   1 cup coffee per day.   Social Drivers of Corporate investment banker Strain: Low Risk  (10/24/2023)   Overall Financial Resource Strain (CARDIA)    Difficulty of Paying Living Expenses: Not hard at all  Food Insecurity: No Food Insecurity (10/24/2023)   Hunger Vital Sign    Worried About Running Out of Food in the Last Year: Never true    Ran Out of Food in the Last Year: Never true  Transportation Needs: No Transportation Needs (10/24/2023)   PRAPARE - Administrator, Civil Service (Medical): No    Lack of Transportation (Non-Medical): No  Physical Activity: Sufficiently Active (10/24/2023)   Exercise Vital Sign    Days of Exercise per Week: 3 days    Minutes of Exercise per Session: 60 min  Stress: No Stress Concern Present (10/24/2023)   [employer] of Occupational Health - Occupational Stress Questionnaire    Feeling of Stress: Not at all  Social Connections: Socially Isolated (10/24/2023)   Social Connection and Isolation Panel    Frequency of Communication with Friends and  Family: More than three times a week    Frequency of Social Gatherings with Friends and Family: More than three times a week    Attends Religious Services: Never    Database administrator or Organizations: No    Attends Engineer, structural: Never    Marital Status: Divorced    Tobacco Counseling Counseling given: Not Answered    Clinical Intake:  Pre-visit preparation completed: Yes  Pain : No/denies pain     BMI - recorded: 37.8 Nutritional Status: BMI > 30  Obese Nutritional Risks: None Diabetes: No  Lab Results  Component Value Date   HGBA1C 5.0 06/25/2023   HGBA1C 4.8 11/30/2022   HGBA1C 4.7 12/05/2021     How often do you need to have someone help you when you read instructions, pamphlets, or other written materials from your doctor or pharmacy?: 1 - Never  Interpreter Needed?: No  Information entered by :: JHONNIE DAS, LPN   Activities of Daily Living    10/24/2023    3:35 PM 03/19/2023   11:28 AM  In your present state of health, do you have any difficulty performing the following activities:  Hearing? 1 0  Vision? 0 1  Difficulty concentrating or making decisions? 0 0  Walking or climbing stairs? 1 1  Dressing or bathing? 0 0  Doing errands, shopping? 1 1  Preparing Food and eating ? N   Using the Toilet? N   In the past six months, have you accidently leaked urine? N   Do you have problems with loss of bowel control? N   Managing your Medications? N   Managing your Finances? N   Housekeeping or managing your Housekeeping? N     Patient Care Team: Bernardo Fend, DO as PCP - General (Internal Medicine) Perla Evalene PARAS, MD as PCP - Cardiology (Cardiology) Cindie Ole DASEN, MD as PCP - Electrophysiology (Cardiology) Penne Knee, MD as Consulting Physician (Urology) Dennise Capri, MD (Nephrology) [facility], [facility] (Optometry)  I have updated your Care Teams any recent Medical Services you may  have received from  other providers in the past year.     Assessment:   This is a routine wellness examination for Leslie Houston.  Hearing/Vision screen Hearing Screening - Comments:: WEARS AIDS, LEFT EAR Vision Screening - Comments:: WEARS GLASSES- [facility]   Goals Addressed             This Visit's Progress    DIET - EAT MORE FRUITS AND VEGETABLES         Depression Screen     10/24/2023    3:30 PM 03/19/2023   11:28 AM 11/30/2022   11:00 AM 11/23/2022   11:36 AM 10/12/2022    3:05 PM 07/02/2022    2:19 PM 05/09/2022   11:12 AM  PHQ 2/9 Scores  PHQ - 2 Score 0 0 0 0 0 0 1  PHQ- 9 Score 0 0 0 0  0 1    Fall Risk     10/24/2023    3:35 PM 03/19/2023   11:28 AM 11/30/2022   11:00 AM 11/23/2022   11:36 AM 10/12/2022    2:59 PM  Fall Risk   Falls in the past year? 0 0 1 1 1   Number falls in past yr: 0 0 1 1 0  Injury with Fall? 0 0 1 1 1   Risk for fall due to : No Fall Risks    Impaired balance/gait;Impaired mobility  Follow up Falls evaluation completed    Education provided;Falls prevention discussed    MEDICARE RISK AT HOME:  Medicare Risk at Home Any stairs in or around the home?: No If so, are there any without handrails?: No Home free of loose throw rugs in walkways, pet beds, electrical cords, etc?: Yes Adequate lighting in your home to reduce risk of falls?: Yes Life alert?: No Use of a cane, walker or w/c?: Yes (CANE SOMETIMES) Grab bars in the bathroom?: Yes Shower chair or bench in shower?: Yes Elevated toilet seat or a handicapped toilet?: Yes  TIMED UP AND GO:  Was the test performed?  No  Cognitive Function: 6CIT completed        10/24/2023    3:37 PM 10/12/2022    3:17 PM  6CIT Screen  What Year? 0 points 0 points  What month?  0 points  What time?  0 points  Count back from 20  0 points  Months in reverse  2 points  Repeat phrase  2 points  Total Score  4 points    Immunizations Immunization History  Administered Date(s) Administered   Fluad Quad(high  Dose 65+) 01/22/2019, 03/04/2020   Influenza-Unspecified 01/28/2021   Moderna Sars-Covid-2 Vaccination 01/26/2020   Pneumococcal Conjugate-13 04/18/2016   Pneumococcal Polysaccharide-23 01/22/2019    Screening Tests Health Maintenance  Topic Date Due   Fecal DNA (Cologuard)  Never done   Zoster Vaccines- Shingrix (1 of 2) Never done   FOOT EXAM  01/22/2020   COVID-19 Vaccine (2 - 2024-25 season) 12/30/2022   DEXA SCAN  01/24/2023   Diabetic kidney evaluation - Urine ACR  05/04/2023   INFLUENZA VACCINE  11/29/2023   HEMOGLOBIN A1C  12/23/2023   OPHTHALMOLOGY EXAM  02/13/2024   MAMMOGRAM  02/14/2024   Diabetic kidney evaluation - eGFR measurement  09/05/2024   Medicare Annual Wellness (AWV)  10/23/2024   Pneumococcal Vaccine: 50+ Years  Completed   Hepatitis C Screening  Completed   Hepatitis B Vaccines  Aged Out   HPV VACCINES  Aged Out   Meningococcal B Vaccine  Aged Out   DTaP/Tdap/Td  Discontinued    Health Maintenance  Health Maintenance Due  Topic Date Due   Fecal DNA (Cologuard)  Never done   Zoster Vaccines- Shingrix (1 of 2) Never done   FOOT EXAM  01/22/2020   COVID-19 Vaccine (2 - 2024-25 season) 12/30/2022   DEXA SCAN  01/24/2023   Diabetic kidney evaluation - Urine ACR  05/04/2023   Health Maintenance Items Addressed: UP TO DATE ON PNA, NEEDS TDAP, SHINGRIX- WANTS NO MORE COVID- UP TO DATE ON MAMMOGRAM, AGED OUT OF COLONOSCOPY, UP TO DATE ON BDS  Additional Screening:  Vision Screening: Recommended annual ophthalmology exams for early detection of glaucoma and other disorders of the eye. Would you like a referral to an eye doctor? No    Dental Screening: Recommended annual dental exams for proper oral hygiene  Community Resource Referral / Chronic Care Management: CRR required this visit?  No   CCM required this visit?  No   Plan:    I have personally reviewed and noted the following in the patient's chart:   Medical and social history Use of  alcohol, tobacco or illicit drugs  Current medications and supplements including opioid prescriptions. Patient is not currently taking opioid prescriptions. Functional ability and status Nutritional status Physical activity Advanced directives List of other physicians Hospitalizations, surgeries, and ER visits in previous 12 months Vitals Screenings to include cognitive, depression, and falls Referrals and appointments  In addition, I have reviewed and discussed with patient certain preventive protocols, quality metrics, and best practice recommendations. A written personalized care plan for preventive services as well as general preventive health recommendations were provided to patient.   Jhonnie GORMAN Das, LPN   [id number]   After Visit Summary: (MyChart) Due to this being a telephonic visit, the after visit summary with patients personalized plan was offered to patient via MyChart   Notes: Nothing significant to report at this time.

## 2023-10-24 NOTE — Patient Instructions (Addendum)
 Leslie Houston , Thank you for taking time out of your busy schedule to complete your Annual Wellness Visit with me. I enjoyed our conversation and look forward to speaking with you again next year. I, as well as your care team,  appreciate your ongoing commitment to your health goals. Please review the following plan we discussed and let me know if I can assist you in the future.   Follow up Visits: Next Medicare AWV with our clinical staff:   11/05/24 @ 2:00 PM BY PHONE Have you seen your provider in the last 6 months (3 months if uncontrolled diabetes)? Yes   Clinician Recommendations:  Aim for 30 minutes of exercise or brisk walking, 6-8 glasses of water , and 5 servings of fruits and vegetables each day. TAKE CARE!      This is a list of the screening recommended for you and due dates:  Health Maintenance  Topic Date Due   Cologuard (Stool DNA test)  Never done   Zoster (Shingles) Vaccine (1 of 2) Never done   Complete foot exam   01/22/2020   COVID-19 Vaccine (2 - 2024-25 season) 12/30/2022   DEXA scan (bone density measurement)  01/24/2023   Yearly kidney health urinalysis for diabetes  05/04/2023   Flu Shot  11/29/2023   Hemoglobin A1C  12/23/2023   Eye exam for diabetics  02/13/2024   Mammogram  02/14/2024   Yearly kidney function blood test for diabetes  09/05/2024   Medicare Annual Wellness Visit  10/23/2024   Pneumococcal Vaccine for age over 25  Completed   Hepatitis C Screening  Completed   Hepatitis B Vaccine  Aged Out   HPV Vaccine  Aged Out   Meningitis B Vaccine  Aged Out   DTaP/Tdap/Td vaccine  Discontinued    Advanced directives: (ACP Link)Information on Advanced Care Planning can be found at [place]  Secretary of [facility] Advance Health Care Directives Advance Health Care Directives. [email or web address]  Advance Care Planning is important because it:  [x]  Makes sure you receive the medical care that is consistent with your values, goals, and preferences  [x]  It provides  guidance to your family and loved ones and reduces their decisional burden about whether or not they are making the right decisions based on your wishes.  Follow the link provided in your after visit summary or read over the paperwork we have mailed to you to help you started getting your Advance Directives in place. If you need assistance in completing these, please reach out to us  so that we can help you!

## 2023-11-07 ENCOUNTER — Ambulatory Visit (INDEPENDENT_AMBULATORY_CARE_PROVIDER_SITE_OTHER): Admitting: Physician Assistant

## 2023-11-07 VITALS — BP 132/77 | HR 78 | Ht 61.0 in | Wt 203.0 lb

## 2023-11-07 DIAGNOSIS — Z435 Encounter for attention to cystostomy: Secondary | ICD-10-CM | POA: Diagnosis not present

## 2023-11-07 NOTE — Progress Notes (Signed)
 Suprapubic Cath Change  Patient is present today for a suprapubic catheter change due to urinary retention.  8ml of water  was drained from the balloon, a 16FR foley cath was removed from the tract with out difficulty.  Site was cleaned and prepped in a sterile fashion with betadine.  A 16FR foley cath was replaced into the tract no complications were noted. Urine return was noted, 10 ml of sterile water  was inflated into the balloon and a night bag was attached for drainage.  Patient tolerated well.   Performed by: Yeraldine Forney, PA-C   Follow up: Return in about 4 weeks (around 12/05/2023) for SPT exchange.

## 2023-12-05 ENCOUNTER — Encounter: Payer: Self-pay | Admitting: Physician Assistant

## 2023-12-05 ENCOUNTER — Ambulatory Visit (INDEPENDENT_AMBULATORY_CARE_PROVIDER_SITE_OTHER): Admitting: Physician Assistant

## 2023-12-05 VITALS — BP 147/80 | HR 63 | Ht 61.0 in | Wt 200.1 lb

## 2023-12-05 DIAGNOSIS — Z435 Encounter for attention to cystostomy: Secondary | ICD-10-CM

## 2023-12-05 NOTE — Progress Notes (Signed)
 Suprapubic Cath Change  Patient is present today for a suprapubic catheter change due to urinary retention.  8ml of water  was drained from the balloon, a 16FR foley cath was removed from the tract with out difficulty.  Site was cleaned and prepped in a sterile fashion with betadine.  A 16FR foley cath was replaced into the tract no complications were noted. Urine return was noted, 10 ml of sterile water  was inflated into the balloon and a night bag was attached for drainage.  Patient tolerated well.   Performed by: Saulo Anthis, PA-C  Additional notes: Rosemary requests routine labs at next visit to make sure she's managing her mom's SPT appropriately; she has no acute concerns. Fine to get UA or CBC based on her preference next time.  Follow up: Return in about 4 weeks (around 01/02/2024) for SPT exchange.

## 2023-12-23 ENCOUNTER — Other Ambulatory Visit: Payer: Self-pay

## 2023-12-23 ENCOUNTER — Encounter: Payer: Self-pay | Admitting: Internal Medicine

## 2023-12-23 ENCOUNTER — Ambulatory Visit (INDEPENDENT_AMBULATORY_CARE_PROVIDER_SITE_OTHER): Payer: Medicare Other | Admitting: Internal Medicine

## 2023-12-23 VITALS — BP 130/72 | HR 63 | Temp 97.9°F | Resp 16 | Ht 61.0 in | Wt 203.3 lb

## 2023-12-23 DIAGNOSIS — E119 Type 2 diabetes mellitus without complications: Secondary | ICD-10-CM | POA: Diagnosis not present

## 2023-12-23 DIAGNOSIS — I10 Essential (primary) hypertension: Secondary | ICD-10-CM

## 2023-12-23 DIAGNOSIS — N184 Chronic kidney disease, stage 4 (severe): Secondary | ICD-10-CM | POA: Diagnosis not present

## 2023-12-23 DIAGNOSIS — M1990 Unspecified osteoarthritis, unspecified site: Secondary | ICD-10-CM | POA: Diagnosis not present

## 2023-12-23 DIAGNOSIS — F419 Anxiety disorder, unspecified: Secondary | ICD-10-CM | POA: Diagnosis not present

## 2023-12-23 DIAGNOSIS — Z960 Presence of urogenital implants: Secondary | ICD-10-CM | POA: Diagnosis not present

## 2023-12-23 MED ORDER — CITALOPRAM HYDROBROMIDE 20 MG PO TABS: 20.0000 mg | ORAL_TABLET | Freq: Every day | ORAL | 1 refills | Status: AC

## 2023-12-23 NOTE — Progress Notes (Signed)
 Established Patient Office Visit  Subjective:  Patient ID: Leslie Houston, female    DOB: 12-18-1950  Age: 73 y.o. MRN: [id number]  CC:  Chief Complaint  Patient presents with   Medical Management of Chronic Issues    HPI [name] presents for follow up on chronic medical conditions. She is here with her daughter.   Discussed the use of AI scribe software for clinical note transcription with the patient, who gave verbal consent to proceed.  History of Present Illness Leslie Houston is a 73 year old female who presents with foot pain and concerns about a possible callus or corn.  The foot pain is described as very painful, located at the joint, and possibly related to a callus or corn. She feels it might be fluid-filled. She has not used any over-the-counter medication due to concerns about interactions with her existing conditions.  She has arthritis and uses Tylenol  pain reliever cream for management. She avoids oral arthritis medications due to kidney function concerns and uses Tylenol  arthritis pills cautiously. Her daughter notes past use of Voltaren  cream, an anti-inflammatory topical treatment.  She has a history of a blood clot in her leg and is cautious with medications that might affect this condition. She experiences foot swelling, worsened by certain shoes, and manages it by elevating her legs and using a footstool.   Hypertension/CHF: -Medications: Hydralazine  10 TID, Torsemide  switched to Lasix  40 mg daily by Cardiology -Patient is compliant with above medications and reports no side effects. -Denies any SOB, CP, vision changes, LE edema or symptoms of hypotension -Following with Cardiology, Dr. Gollan -Last echo 9/24 EF 60 to 65% with no wall motion abnormalities  Hx of CVA/CAD/Hx of DVT: -Currently on Zetia  and aspirin , statin intolerance history  -Had been on Eliquis  after DVT last summer but she finished this back in January Lipid Panel      Component Value Date/Time   CHOL 194 06/25/2023 1125   TRIG 100 06/25/2023 1125   HDL 50 06/25/2023 1125   CHOLHDL 3.9 06/25/2023 1125   VLDL 9 11/19/2016 1141   LDLCALC 123 (H) 06/25/2023 1125   CKD4: -Last creatinine 5/25 2.52, GFR 19 -Following with Nephrology  Seizure Disorder: -Keppra  decreased to 500 mg twice daily due to renal function -Following with Neurology  GERD: -Currently on Protonix  20 mg, symptoms well controlled.  -Avoiding trigger foods and laying on 3 pillows at night  -Denies nausea, vomiting or abdominal pain.  Weight stable appetite good  MDD: -Mood status: stable -Current treatment: back on the Celexa  20 mg at bedtime     12/23/2023   11:06 AM 10/24/2023    3:30 PM 03/19/2023   11:28 AM 11/30/2022   11:00 AM 11/23/2022   11:36 AM  Depression screen PHQ 2/9  Decreased Interest 0 0 0 0 0  Down, Depressed, Hopeless 0 0 0 0 0  PHQ - 2 Score 0 0 0 0 0  Altered sleeping 0 0 0 0 0  Tired, decreased energy 0 0 0 0 0  Change in appetite 0 0 0 0 0  Feeling bad or failure about yourself  0 0 0 0 0  Trouble concentrating 0 0 0 0 0  Moving slowly or fidgety/restless 0 0 0 0 0  Suicidal thoughts 0 0 0 0 0  PHQ-9 Score 0 0 0 0 0  Difficult doing work/chores Not difficult at all Not difficult at all Not difficult at all Not difficult at all Not difficult at  all   Controlled diabetes, Type 2: -Last A1c 4.8% 8/24 -Medications: Nothing -Foot exam due today  Arthritis:  -Having some pain in hands and knees, taking Tylenol  but not well controlled. -Cannot take NSAID;s   Health Maintenance: -Blood work UTD -Mammogram up-to-date 10/24, BI-RADS 1  Past Medical History:  Diagnosis Date   (HFpEF) heart failure with preserved ejection fraction (HCC)    a. Pt reports in 2000 she had CHF, details unclear, outside hospital; b. 06/2019 Echo: EF 50-55%, no rwma, Gr2 DD,  mildly dil LA, mild MR.   Acute left PCA stroke (HCC) 06/22/2014   Anxiety    a.) on BZO  (alprazolam ) PRN   CAD (coronary artery disease)    a. Pt reports in 2000 she had a stent to a coronary artery, details unclear, outside hospital.   Carotid stenosis    a. CT angio head 07/2014 - 50-60% stenoses of prox ICA bilaterally.   CKD (chronic kidney disease), stage IV (HCC)    Depression    Diabetes mellitus (HCC)    Denies.   Edentulous    No upper teeth   Family hx of colon cancer 06/13/2018   Father diagnosed in his 31's   Gout    History of cataract extraction, left 03/21/2021   HOH (hard of hearing)    usually needs to read lips   Hyperlipidemia    Hypertension    Intellectual disability    a.) born premature, limited formal education, has never driven   Myalgia due to statin    Orthostatic hypotension    OSA (obstructive sleep apnea)    a.) not on nocturnal PAP therapy; old/nonfuntioning DME - referred for repeat PSG 01/2022 by neurology   Osteopenia 01/24/2018   DEXA Sept 2019   PONV (postoperative nausea and vomiting)    PTSD (post-traumatic stress disorder)        PUD (peptic ulcer disease)    PVC's (premature ventricular contractions)    Right sided weakness 05/2014   a.) s/p CVA   Seizures (HCC)    Sinus bradycardia    Syncope    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)     Past Surgical History:  Procedure Laterality Date   BLADDER SURGERY     CATARACT EXTRACTION W/PHACO Left 03/21/2021   Procedure: CATARACT EXTRACTION PHACO AND INTRAOCULAR LENS PLACEMENT (IOC) left 5.86 00:38.0;  Surgeon: Jaye Fallow, MD;  Location: [facility];  Service: Ophthalmology;  Laterality: Left;  Latex   CESAREAN SECTION     CORONARY ANGIOPLASTY WITH STENT PLACEMENT     CYSTOSCOPY N/A 03/12/2022   Procedure: CYSTOSCOPY;  Surgeon: Penne Knee, MD;  Location: [facility];  Service: Urology;  Laterality: N/A;   DENTAL SURGERY     INSERTION OF SUPRAPUBIC CATHETER N/A 03/12/2022   Procedure: INSERTION OF SUPRAPUBIC CATHETER;  Surgeon: Penne Knee, MD;  Location: [facility];  Service: Urology;  Laterality: N/A;   LAPAROTOMY N/A 03/22/2022   Procedure: EXPLORATORY LAPAROTOMY; LYSIS OF ADHESIONS;  Surgeon: Tye Millet, DO;  Location: [facility];  Service: General;  Laterality: N/A;   RECTAL EXAM UNDER ANESTHESIA N/A 03/12/2022   Procedure: PELVIC EXAM UNDER ANESTHESIA;  Surgeon: Penne Knee, MD;  Location: [facility];  Service: Urology;  Laterality: N/A;    Family History  Problem Relation Age of Onset   Stroke Mother    Heart attack Mother    Lung cancer Mother    Stroke Father    Prostate cancer Father  Colon cancer Father    Diabetes Mellitus II Daughter    Multiple sclerosis Daughter    Cancer Paternal Grandfather    Kidney disease Son    Heart failure Son    Breast cancer Neg Hx     Social History   Socioeconomic History   Marital status: Single    Spouse name: Not on file   Number of children: 2   Years of education: 12   Highest education level: Not on file  Occupational History   Occupation: Disabled  Tobacco Use   Smoking status: Former    Current packs/day: 0.00    Types: Cigarettes    Quit date: 03/30/2014    Years since quitting: 9.7    Passive exposure: Past   Smokeless tobacco: Never  Vaping Use   Vaping status: Never Used  Substance and Sexual Activity   Alcohol use: No   Drug use: No   Sexual activity: Not Currently  Other Topics Concern   Not on file  Social History Narrative   Lives at home with her son's father.   Right-handed.   1 cup coffee per day.   Social Drivers of Corporate investment banker Strain: Low Risk  (10/24/2023)   Overall Financial Resource Strain (CARDIA)    Difficulty of Paying Living Expenses: Not hard at all  Food Insecurity: No Food Insecurity (10/24/2023)   Hunger Vital Sign    Worried About Running Out of Food in the Last Year: Never true    Ran Out of Food in the Last Year: Never true  Transportation Needs: No Transportation Needs (10/24/2023)    PRAPARE - Administrator, Civil Service (Medical): No    Lack of Transportation (Non-Medical): No  Physical Activity: Sufficiently Active (10/24/2023)   Exercise Vital Sign    Days of Exercise per Week: 3 days    Minutes of Exercise per Session: 60 min  Stress: No Stress Concern Present (10/24/2023)   [employer] of Occupational Health - Occupational Stress Questionnaire    Feeling of Stress: Not at all  Social Connections: Socially Isolated (10/24/2023)   Social Connection and Isolation Panel    Frequency of Communication with Friends and Family: More than three times a week    Frequency of Social Gatherings with Friends and Family: More than three times a week    Attends Religious Services: Never    Database administrator or Organizations: No    Attends Banker Meetings: Never    Marital Status: Divorced  Catering manager Violence: Not At Risk (10/24/2023)   Humiliation, Afraid, Rape, and Kick questionnaire    Fear of Current or Ex-Partner: No    Emotionally Abused: No    Physically Abused: No    Sexually Abused: No    Outpatient Medications Prior to Visit  Medication Sig Dispense Refill   cholecalciferol  (VITAMIN D3) 25 MCG (1000 UNIT) tablet Take 1,000 Units by mouth daily.     citalopram  (CELEXA ) 20 MG tablet Take 1 tablet (20 mg total) by mouth daily. 90 tablet 1   furosemide  (LASIX ) 40 MG tablet Take 1 tablet (40 mg total) by mouth daily. 90 tablet 3   hydrALAZINE  (APRESOLINE ) 10 MG tablet Take 10 mg by mouth 3 (three) times daily.     levETIRAcetam  (KEPPRA ) 500 MG tablet Take 1 tablet (500 mg total) by mouth 2 (two) times daily. 180 tablet 3   metoCLOPramide  (REGLAN ) 10 MG tablet Take 1 tablet (10 mg total)  by mouth every 6 (six) hours as needed. 30 tablet 0   nitroGLYCERIN  (NITROSTAT ) 0.4 MG SL tablet Place 1 tablet (0.4 mg total) under the tongue every 5 (five) minutes as needed for chest pain. 30 tablet 12   pantoprazole  (PROTONIX ) 20 MG tablet  TAKE 1 TABLET BY MOUTH EVERY MORNING 90 tablet 1   No facility-administered medications prior to visit.    Allergies  Allergen Reactions   Bactrim  [Sulfamethoxazole -Trimethoprim ] Other (See Comments)    Hematuria, dysuria   Latex Rash   Penicillin G Itching   Atorvastatin  Other (See Comments)    Myalgias    Other Hives    Adhesive tape   Amlodipine  Swelling    Leg swelling   Doxycycline      Blood in urine    ROS Review of Systems  Musculoskeletal:  Positive for arthralgias.      Objective:    Physical Exam Constitutional:      Appearance: Normal appearance.  HENT:     Head: Normocephalic and atraumatic.  Eyes:     Conjunctiva/sclera: Conjunctivae normal.  Cardiovascular:     Rate and Rhythm: Normal rate and regular rhythm.     Pulses:          Dorsalis pedis pulses are 2+ on the right side and 2+ on the left side.  Pulmonary:     Effort: Pulmonary effort is normal.     Breath sounds: Normal breath sounds.  Musculoskeletal:     Right foot: Normal range of motion. No deformity, bunion, Charcot foot, foot drop or prominent metatarsal heads.     Left foot: Normal range of motion. No deformity, bunion, Charcot foot, foot drop or prominent metatarsal heads.  Feet:     Right foot:     Protective Sensation: 6 sites tested.  6 sites sensed.     Skin integrity: Skin integrity normal.     Toenail Condition: Right toenails are normal.     Left foot:     Protective Sensation: 6 sites tested.  6 sites sensed.     Skin integrity: Callus present.     Toenail Condition: Left toenails are normal.  Skin:    General: Skin is warm and dry.  Neurological:     General: No focal deficit present.     Mental Status: She is alert. Mental status is at baseline.  Psychiatric:        Mood and Affect: Mood normal.        Behavior: Behavior normal.     BP 130/72 (Cuff Size: Large)   Pulse 63   Temp 97.9 F (36.6 C) (Oral)   Resp 16   Ht 5' 1 (1.549 m)   Wt 203 lb 4.8 oz (92.2  kg)   BMI 38.41 kg/m  Wt Readings from Last 3 Encounters:  12/23/23 203 lb 4.8 oz (92.2 kg)  12/05/23 200 lb 1.6 oz (90.8 kg)  11/07/23 203 lb (92.1 kg)     Health Maintenance Due  Topic Date Due   Fecal DNA (Cologuard)  Never done   Zoster Vaccines- Shingrix (1 of 2) Never done   FOOT EXAM  01/22/2020   COVID-19 Vaccine (2 - 2024-25 season) 12/30/2022   DEXA SCAN  01/24/2023   Diabetic kidney evaluation - Urine ACR  05/04/2023   INFLUENZA VACCINE  11/29/2023   HEMOGLOBIN A1C  12/23/2023    There are no preventive care reminders to display for this patient.  Lab Results  Component Value Date  TSH 0.451 11/20/2022   Lab Results  Component Value Date   WBC 4.4 11/20/2022   HGB 12.3 11/20/2022   HCT 37.3 11/20/2022   MCV 99.2 11/20/2022   PLT 254 11/20/2022   Lab Results  Component Value Date   NA 141 09/06/2023   K 4.4 09/06/2023   CO2 18 (L) 09/06/2023   GLUCOSE 78 09/06/2023   BUN 33 (H) 09/06/2023   CREATININE 2.52 (H) 09/06/2023   BILITOT 0.5 06/25/2023   ALKPHOS 84 11/20/2022   AST 12 06/25/2023   ALT 6 06/25/2023   PROT 7.6 06/25/2023   ALBUMIN  3.4 (L) 11/20/2022   CALCIUM  9.8 09/06/2023   ANIONGAP 7 11/20/2022   EGFR 19 (L) 09/06/2023   Lab Results  Component Value Date   CHOL 194 06/25/2023   Lab Results  Component Value Date   HDL 50 06/25/2023   Lab Results  Component Value Date   LDLCALC 123 (H) 06/25/2023   Lab Results  Component Value Date   TRIG 100 06/25/2023   Lab Results  Component Value Date   CHOLHDL 3.9 06/25/2023   Lab Results  Component Value Date   HGBA1C 5.0 06/25/2023      Assessment & Plan:   Assessment & Plan Foot lesion (callus/corn/cyst) Painful foot lesion, likely callus, corn, or cyst. No infection. Prefers conservative management. - Recommend cushioning and support in shoes. - Consider over-the-counter wart remover if appropriate. - Refer to podiatry if conservative measures fail.  Unspecified  osteoarthritis Chronic osteoarthritis with pain management limited by chronic kidney disease. Tylenol  and Voltaren  gel discussed as safest options. Tramadol  declined. - Continue Tylenol  for pain management. - Use Voltaren  gel for topical relief. - Encourage weight management and low-impact exercise.  Chronic kidney disease Chronic kidney disease limits medication options for osteoarthritis and edema. Avoid NSAIDs and cautious diuretic use advised. - Avoid NSAIDs. - Continue current diuretic regimen without increasing dosage.  Edema of lower extremities Edema likely due to fluid retention, complicated by chronic kidney disease. - Encourage leg elevation. - Continue current diuretic regimen without increasing dosage.  Urinary incontinence with indwelling urinary catheter Managed with indwelling catheter. Recent improvement in urinary function noted. - Continue monthly catheter changes. - Inform urologist of recent improvement.  Major depressive disorder, single episode, unspecified - Continue current medication regimen.  Hypertension -Blood pressure stable here today, no changes made to medications.   - HM Diabetes Foot Exam - citalopram  (CELEXA ) 20 MG tablet; Take 1 tablet (20 mg total) by mouth daily.  Dispense: 90 tablet; Refill: 1   Follow-up: Return in about 6 months (around 06/24/2024).    Sharyle Fischer, DO

## 2023-12-30 NOTE — Progress Notes (Deleted)
 Cardiology Office Note  Date:  12/30/2023   ID:  Leslie Houston, DOB 28-Mar-1951, MRN [id number]  PCP:  Bernardo Fend, DO   No chief complaint on file.   HPI:  Leslie Houston is a 73 y.o. female with a hx of  Coronary artery disease, history of stenting 2000 (details unavailable) chronic kidney disease,  Former smoker  seizure disorder,  prior stroke residual right-sided vision loss/hearing loss,  hospital March 2021 with worsening renal function, worsening leg swelling shortness of breath symptoms over the past 2 months Who presents for office follow-up of her diastolic CHF  Last seen in clinic by myself 4/24 Seen by one of our providers September 2024      In follow-up today she reports feeling well Denies significant leg swelling/edema, no abdominal distention Feels her breathing is good Daughter who presents with her reports that her mother can out walk her Previously reported Difficulty walking, residual deficits on the right from her prior stroke, right leg weakness  Followed by Dr. Dennise, CR 2.5  Has suprapubic cath Recent urinary tract infection, treated  No regular exercise program Continues on torsemide  20 daily  CT scan abdomen February 2024 reviewed, images pulled up Moderate diffuse aortic atherosclerosis noted  Reports previously did not tolerate Zetia , lovastatin  secondary to side effects, myalgias, skin rash  EKG personally reviewed by myself on todays visit Sinus bradycardia rate 55 bpm no significant ST-T wave changes  Other past medical history reviewed  hospital March 2021 massive leg swelling, shortness of breath,anemic hematocrit 26/hemoglobin 8.7 Amlodipine  was held, Echo with normal ejection fraction It was felt her anemia was secondary to renal disease and iron deficiency  Blood pressure medication changes made Had obstructive uropathy Sinus pauses initially noted on telemetry resolved Felt vagal reaction, no indication for  pacing Recommendation made for outpatient sleep study It was recommended that she avoid beta-blockers, clonidine , verapamil/diltiazem  2D echo 07/03/2019:  1. Left ventricular ejection fraction, by estimation, is 50 to 55%.   PMH:   has a past medical history of (HFpEF) heart failure with preserved ejection fraction (HCC), Acute left PCA stroke (HCC) (06/22/2014), Anxiety, CAD (coronary artery disease), Carotid stenosis, CKD (chronic kidney disease), stage IV (HCC), Depression, Diabetes mellitus (HCC), Edentulous, Family hx of colon cancer (06/13/2018), Gout, History of cataract extraction, left (03/21/2021), HOH (hard of hearing), Hyperlipidemia, Hypertension, Intellectual disability, Myalgia due to statin, Orthostatic hypotension, OSA (obstructive sleep apnea), Osteopenia (01/24/2018), PONV (postoperative nausea and vomiting), PTSD (post-traumatic stress disorder), PUD (peptic ulcer disease), PVC's (premature ventricular contractions), Right sided weakness (05/2014), Seizures (HCC), Sinus bradycardia, Syncope, and 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).  PSH:    Past Surgical History:  Procedure Laterality Date   BLADDER SURGERY     CATARACT EXTRACTION W/PHACO Left 03/21/2021   Procedure: CATARACT EXTRACTION PHACO AND INTRAOCULAR LENS PLACEMENT (IOC) left 5.86 00:38.0;  Surgeon: Jaye Fallow, MD;  Location: [facility];  Service: Ophthalmology;  Laterality: Left;  Latex   CESAREAN SECTION     CORONARY ANGIOPLASTY WITH STENT PLACEMENT     CYSTOSCOPY N/A 03/12/2022   Procedure: CYSTOSCOPY;  Surgeon: Penne Knee, MD;  Location: [facility];  Service: Urology;  Laterality: N/A;   DENTAL SURGERY     INSERTION OF SUPRAPUBIC CATHETER N/A 03/12/2022   Procedure: INSERTION OF SUPRAPUBIC CATHETER;  Surgeon: Penne Knee, MD;  Location: [facility];  Service: Urology;  Laterality: N/A;   LAPAROTOMY N/A 03/22/2022   Procedure: EXPLORATORY LAPAROTOMY; LYSIS OF ADHESIONS;   Surgeon:  Tye Millet, DO;  Location: [facility];  Service: General;  Laterality: N/A;   RECTAL EXAM UNDER ANESTHESIA N/A 03/12/2022   Procedure: PELVIC EXAM UNDER ANESTHESIA;  Surgeon: Penne Knee, MD;  Location: [facility];  Service: Urology;  Laterality: N/A;    Current Outpatient Medications  Medication Sig Dispense Refill   cholecalciferol  (VITAMIN D3) 25 MCG (1000 UNIT) tablet Take 1,000 Units by mouth daily.     citalopram  (CELEXA ) 20 MG tablet Take 1 tablet (20 mg total) by mouth daily. 90 tablet 1   furosemide  (LASIX ) 40 MG tablet Take 1 tablet (40 mg total) by mouth daily. 90 tablet 3   hydrALAZINE  (APRESOLINE ) 10 MG tablet Take 10 mg by mouth 3 (three) times daily.     levETIRAcetam  (KEPPRA ) 500 MG tablet Take 1 tablet (500 mg total) by mouth 2 (two) times daily. 180 tablet 3   metoCLOPramide  (REGLAN ) 10 MG tablet Take 1 tablet (10 mg total) by mouth every 6 (six) hours as needed. 30 tablet 0   nitroGLYCERIN  (NITROSTAT ) 0.4 MG SL tablet Place 1 tablet (0.4 mg total) under the tongue every 5 (five) minutes as needed for chest pain. 30 tablet 12   pantoprazole  (PROTONIX ) 20 MG tablet TAKE 1 TABLET BY MOUTH EVERY MORNING 90 tablet 1   No current facility-administered medications for this visit.    Allergies:   Bactrim  [sulfamethoxazole -trimethoprim ], Latex, Penicillin g, Atorvastatin , Other, Amlodipine , and Doxycycline    Social History:  The patient  reports that she quit smoking about 9 years ago. Her smoking use included cigarettes. She has been exposed to tobacco smoke. She has never used smokeless tobacco. She reports that she does not drink alcohol and does not use drugs.   Family History:   family history includes Cancer in her paternal grandfather; Colon cancer in her father; Diabetes Mellitus II in her daughter; Heart attack in her mother; Heart failure in her son; Kidney disease in her son; Lung cancer in her mother; Multiple sclerosis in her daughter; Prostate cancer in her  father; Stroke in her father and mother.    Review of Systems: Review of Systems  Constitutional: Negative.   HENT: Negative.    Eyes: Negative.   Respiratory: Negative.    Cardiovascular: Negative.   Gastrointestinal: Negative.   Musculoskeletal: Negative.   Neurological: Negative.   Psychiatric/Behavioral: Negative.    All other systems reviewed and are negative.   PHYSICAL EXAM: VS:  There were no vitals taken for this visit. , BMI There is no height or weight on file to calculate BMI. Constitutional:  oriented to person, place, and time. No distress.  HENT:  Head: Grossly normal Eyes:  no discharge. No scleral icterus.  Neck: No JVD, no carotid bruits  Cardiovascular: Regular rate and rhythm, no murmurs appreciated Pulmonary/Chest: Clear to auscultation bilaterally, no wheezes or rails Abdominal: Soft.  no distension.  no tenderness.  Musculoskeletal: Normal range of motion Neurological:  normal muscle tone. Coordination normal. No atrophy Skin: Skin warm and dry Psychiatric: normal affect, pleasant  Recent Labs: 06/25/2023: ALT 6 09/06/2023: BUN 33; Creatinine, Ser 2.52; Potassium 4.4; Sodium 141    Lipid Panel Lab Results  Component Value Date   CHOL 194 06/25/2023   HDL 50 06/25/2023   LDLCALC 123 (H) 06/25/2023   TRIG 100 06/25/2023      Wt Readings from Last 3 Encounters:  12/23/23 203 lb 4.8 oz (92.2 kg)  12/05/23 200 lb 1.6 oz (90.8 kg)  11/07/23 203 lb (92.1 kg)  ASSESSMENT AND PLAN:  Problem List Items Addressed This Visit   None   Acute on chronic diastolic CHF Appears euvolemic Torsemide  20 daily Weight stable Creatinine stable 2.5, followed by nephrology Denies high salt or fluid intake  Chronic kidney disease Followed by nephrology, creatinine 2.5  Anemia Hemoglobin stable, hemoglobin 9.5  History of stroke On aspirin ,  Did not tolerate statins Residual right-sided deficits Normal sinus rhythm  History of obstructive  uropathy Followed by urology  Coronary artery disease with prior stenting Currently with no symptoms of angina. No further workup at this time.  Recommend she start Repatha  for hyperlipidemia as she did not tolerate Zetia , lovastatin    Total encounter time more than 30 minutes  Greater than 50% was spent in counseling and coordination of care with the patient    Signed, Velinda Lunger, M.D., Ph.D. [facility] [place], [place] [phone]

## 2023-12-31 ENCOUNTER — Ambulatory Visit: Admitting: Cardiovascular Disease

## 2023-12-31 DIAGNOSIS — I493 Ventricular premature depolarization: Secondary | ICD-10-CM

## 2023-12-31 DIAGNOSIS — E782 Mixed hyperlipidemia: Secondary | ICD-10-CM

## 2023-12-31 DIAGNOSIS — I10 Essential (primary) hypertension: Secondary | ICD-10-CM

## 2023-12-31 DIAGNOSIS — N185 Chronic kidney disease, stage 5: Secondary | ICD-10-CM

## 2023-12-31 DIAGNOSIS — I6529 Occlusion and stenosis of unspecified carotid artery: Secondary | ICD-10-CM

## 2023-12-31 DIAGNOSIS — I25118 Atherosclerotic heart disease of native coronary artery with other forms of angina pectoris: Secondary | ICD-10-CM

## 2023-12-31 DIAGNOSIS — N184 Chronic kidney disease, stage 4 (severe): Secondary | ICD-10-CM

## 2023-12-31 DIAGNOSIS — I5032 Chronic diastolic (congestive) heart failure: Secondary | ICD-10-CM

## 2024-01-06 ENCOUNTER — Ambulatory Visit (INDEPENDENT_AMBULATORY_CARE_PROVIDER_SITE_OTHER): Admitting: Physician Assistant

## 2024-01-06 ENCOUNTER — Encounter: Payer: Self-pay | Admitting: Cardiology

## 2024-01-06 ENCOUNTER — Ambulatory Visit: Attending: Cardiology | Admitting: Cardiology

## 2024-01-06 VITALS — BP 140/74 | HR 49 | Ht 61.0 in | Wt 201.6 lb

## 2024-01-06 VITALS — BP 147/77 | HR 67 | Ht 61.0 in | Wt 201.8 lb

## 2024-01-06 DIAGNOSIS — I25118 Atherosclerotic heart disease of native coronary artery with other forms of angina pectoris: Secondary | ICD-10-CM | POA: Diagnosis not present

## 2024-01-06 DIAGNOSIS — Z435 Encounter for attention to cystostomy: Secondary | ICD-10-CM | POA: Insufficient documentation

## 2024-01-06 DIAGNOSIS — N184 Chronic kidney disease, stage 4 (severe): Secondary | ICD-10-CM | POA: Diagnosis not present

## 2024-01-06 DIAGNOSIS — E782 Mixed hyperlipidemia: Secondary | ICD-10-CM | POA: Diagnosis not present

## 2024-01-06 DIAGNOSIS — Z86718 Personal history of other venous thrombosis and embolism: Secondary | ICD-10-CM | POA: Diagnosis not present

## 2024-01-06 DIAGNOSIS — I10 Essential (primary) hypertension: Secondary | ICD-10-CM | POA: Diagnosis not present

## 2024-01-06 DIAGNOSIS — I5032 Chronic diastolic (congestive) heart failure: Secondary | ICD-10-CM | POA: Diagnosis not present

## 2024-01-06 DIAGNOSIS — R001 Bradycardia, unspecified: Secondary | ICD-10-CM | POA: Insufficient documentation

## 2024-01-06 MED ORDER — EZETIMIBE 10 MG PO TABS
10.0000 mg | ORAL_TABLET | Freq: Every day | ORAL | 3 refills | Status: DC
Start: 2024-01-06 — End: 2024-01-13

## 2024-01-06 NOTE — Progress Notes (Signed)
 Cardiology Office Note   Date:  01/06/2024  ID:  Aliena Ghrist, DOB 1950-11-14, MRN [id number] PCP: Bernardo Fend, DO  [facility] Providers Cardiologist:  Evalene Lunger, MD Electrophysiologist:  OLE ONEIDA HOLTS, MD     History of Present Illness Armani Gawlik is a 73 y.o. female with past medical history of HFpEF, CAD status post remote stenting, carotid artery stenosis, hypertension, hyperlipidemia, CKD stage IV, CVA, and anemia, who presents today for follow-up of her chronic HFpEF.   Patient underwent coronary stenting in 2000 was unclear details.  March 2021 she was admitted with HFpEF with worsening renal function and noted to be anemic.  Echocardiogram revealed an LVEF of 50-55%, no significant valvular abnormalities, G2 DD.  Sinus pauses noted on telemetry but was felt to be vasovagal in etiology and self resolved.  She was hospitalized 7/19 - 7/20 for UTI and DVT.  She was started on apixaban  therapy for DVT.  She reported atypical chest pain in the hospital with negative troponins.  Echocardiogram was ordered but not performed.  She was subsequently evaluated in the emergency department 11/17/2022 for bradycardia.  It was noted the patient had heart rates in the 40s and was recommended she go to the emergency department.  Baseline heart rate was low in the 50s.  Patient denies any symptoms.  In the emergency department heart rate is 45 bpm.  EKG showed sinus bradycardia with a rate of 47 and prolonged PR interval.  Labs were unremarkable and she was discharged home.  Patient was evaluated by EP 01/02/23 she was asymptomatic.  Echocardiogram previously revealed LVEF of 60-65%, G1 DD, mild MR.  It was recommended at that time she continue to monitor symptoms.   She was last seen in clinic on 01/18/2023 reporting overall she was feeling okay.  Blood pressure was mildly elevated.  She denied any chest pain, shortness of breath, lightheadedness or dizziness.  States that  she had a headache.  Caregiver said she had a new Rx for torsemide  that caused her to go to the hospital.  She thought that she had a reaction to the new type of torsemide .  I will.  She reports lower leg edema since torsemide  had been stopped.  She was restarted on furosemide  40 mg daily with appointment in 2 weeks.  She returns to clinic today states that she has been feeling well from the cardiac perspective.  She just come from upstairs at the urology clinic having her suprapubic catheter changed.  She is accompanied today by her caregiver.  She states that during her last hospitalization approximately a year ago large quantity of her medications were discontinued her cholesterol medication was one of them.  She denies any lightheadedness or dizziness.  She denies any chest pain, shortness of breath or dyspnea on exertion.  States that she has been compliant with current medications that she has.  She denies any hospitalizations or visits to the emergency department.  ROS: 10 point review of systems has been reviewed and considered negative the exception was been listed in the HPI  Studies Reviewed EKG Interpretation Date/Time:  Monday January 06 2024 11:10:38 EDT Ventricular Rate:  46 PR Interval:  240 QRS Duration:  96 QT Interval:  466 QTC Calculation: 407 R Axis:   56  Text Interpretation: Sinus bradycardia with 1st degree A-V block ST & T wave abnormality, consider inferolateral ischemia When compared with ECG of 18-Jan-2023 14:30, No significant change since last tracing Confirmed by Gerard Frederick ([id number]) on  01/06/2024 11:17:42 AM    Event Monitor (Zio) 02/01/2023 HR 34 - 125, average 49 bpm. 7 nonsustained SVT, longest 8 beats. Rare supraventricular and ventricular ectopy. No sustained arrhythmias. No atrial fibrillation.  Echo 12/2022  1. Left ventricular ejection fraction, by estimation, is 60 to 65%. Left  ventricular ejection fraction by 2D MOD biplane is 61.7 %. The left   ventricle has normal function. The left ventricle has no regional wall  motion abnormalities. Left ventricular  diastolic parameters are consistent with Grade I diastolic dysfunction  (impaired relaxation). The average left ventricular global longitudinal  strain is -15.5 %.   2. Right ventricular systolic function is normal. The right ventricular  size is normal.   3. The mitral valve is normal in structure. Mild mitral valve  regurgitation. No evidence of mitral stenosis.   4. The aortic valve has an indeterminant number of cusps. Aortic valve  regurgitation is not visualized. No aortic stenosis is present.   5. The inferior vena cava is normal in size with greater than 50%  respiratory variability, suggesting right atrial pressure of 3 mmHg.   6. Heart rate in the 40s   Comparison(s): Previous Echo showed LV EF 50-55%, Grade II diastolic  dysfunction, and a degenerative MV. Risk Assessment/Calculations   HYPERTENSION CONTROL Vitals:   01/06/24 1105  BP: (!) 140/74    The patient's blood pressure is elevated above target today.  In order to address the patient's elevated BP: Blood pressure will be monitored at home to determine if medication changes need to be made. (patient had just finished a procedure in urology)          Physical Exam VS:  BP (!) 140/74   Pulse (!) 49   Ht 5' 1 (1.549 m)   Wt 201 lb 9.6 oz (91.4 kg)   SpO2 97%   BMI 38.09 kg/m        Wt Readings from Last 3 Encounters:  01/06/24 201 lb 9.6 oz (91.4 kg)  01/06/24 201 lb 12.8 oz (91.5 kg)  12/23/23 203 lb 4.8 oz (92.2 kg)    GEN: Well nourished, well developed in no acute distress NECK: No JVD; No carotid bruits CARDIAC: RRR, no murmurs, rubs, gallops RESPIRATORY:  Clear to auscultation without rales, wheezing or rhonchi  ABDOMEN: Soft, non-tender, non-distended EXTREMITIES:  No edema; No deformity   ASSESSMENT AND PLAN CAD with remote stenting with the patient continues to deny any chest  pain or shortness of breath.  EKG today reveals sinus bradycardia with a rate of 46 with first-degree AV block with nonspecific ST and T wave abnormality with no significant change from prior studies.  No further ischemic testing needed at this time.  Previously was not on aspirin  because she was on apixaban .  No longer on statin medications started on ezetimibe  10 mg daily today and aspirin  81 mg daily.  Chronic HFpEF states that she has been doing well without decompensation.  The last echocardiogram revealed an LVEF of 60-65% in 12/2022.  She does appear to be euvolemic on exam.  She is continued on furosemide  40 mg daily.  No further escalation of GDMT due to elevated serum creatinine.  Sinus bradycardia with sinus rhythm at rate of 46 with a first-degree AV block noted on EKG today.  Patient is not on any AV nodal blocking agents.  She overall is asymptomatic and chronotropically appropriate.  Evaluated by EP last year who recommended monitoring blood pressure and symptoms.  Patient has been  stable with no changes.  Will continue to monitor at this time.  Hypertension with a blood pressure today 140/74.  Blood pressure slightly elevated as she has just recently had suprapubic catheter changed at urology.  She takes hydralazine  10 mg 3 times daily and furosemide  40 mg daily encouraged to continue to monitor pressures 1 to 2 hours postmedication administration as well.  Hyperlipidemia with a last LDL of 123 with a goal of less than 70.  She has started on ezetimibe  10 mg daily with repeat lipid and hepatic panel in 3 months.  Previously was on Praluent  which was discontinued during hospitalization in 2024.  CKD with last creatinine 2.63.  Ongoing management per nephrology.  Recommended avoiding NSAIDs.  DVT previously was on apixaban  5 mg twice daily.  Treatment was completed apixaban  was discontinued recommend restarting aspirin  81 mg daily.       Dispo: Patient to return to clinic as MD/APP in 6  months or sooner if needed for further evaluation.  Signed, Hibba Schram, NP

## 2024-01-06 NOTE — Progress Notes (Signed)
 Suprapubic Cath Change  Patient is present today for a suprapubic catheter change due to urinary retention.  8ml of water  was drained from the balloon, a 16FR foley cath was removed from the tract without difficulty.  Site was cleaned and prepped in a sterile fashion with betadine.  A 16FR foley cath was replaced into the tract no complications were noted. Urine return was noted, 10 ml of sterile water  was inflated into the balloon and a night bag was attached for drainage.  Patient tolerated well.    Performed by: Mukund Weinreb, PA-C   Follow up: Return in about 4 weeks (around 02/03/2024) for SPT exchange.

## 2024-01-06 NOTE — Patient Instructions (Signed)
 Medication Instructions:  Your physician recommends the following medication changes.  START TAKING: Zetia  10 mg Daily  *If you need a refill on your cardiac medications before your next appointment, please call your pharmacy*  Lab Work: Your provider would like for you to return in 3 months to have the following labs drawn: lipid and hepatic panel.   Please go to [facility] [address] ([facility] [address], [place] [address] You do not need an appointment.  They are open from 8 am- 4:30 pm.  Lunch from 1:00 pm- 2:00 pm You DO NEED need to be fasting.  If you have labs (blood work) drawn today and your tests are completely normal, you will receive your results only by: MyChart Message (if you have MyChart) OR A paper copy in the mail If you have any lab test that is abnormal or we need to change your treatment, we will call you to review the results.  Testing/Procedures: No test ordered today   Follow-Up: At [facility], you and your health needs are our priority.  As part of our continuing mission to provide you with exceptional heart care, our providers are all part of one team.  This team includes your primary Cardiologist (physician) and Advanced Practice Providers or APPs (Physician Assistants and Nurse Practitioners) who all work together to provide you with the care you need, when you need it.  Your next appointment:   12 month(s)  Provider:   You may see Timothy Gollan, MD or one of the following Advanced Practice Providers on your designated Care Team:   Lonni Meager, NP Lesley Maffucci, PA-C Bernardino Bring, PA-C Cadence Pronghorn, PA-C Tylene Lunch, NP Barnie Hila, NP

## 2024-01-13 ENCOUNTER — Telehealth: Payer: Self-pay | Admitting: Cardiology

## 2024-01-13 DIAGNOSIS — Z79899 Other long term (current) drug therapy: Secondary | ICD-10-CM

## 2024-01-13 NOTE — Telephone Encounter (Signed)
 With the side effects stopping the medication is the best option. Recommend having an updated BMP done since Zetia  is primarily metabolized by the liver and not the kidneys.

## 2024-01-13 NOTE — Telephone Encounter (Signed)
 Gwenlyn, pt's daughter, called back: Reports that pt been off med since Saturday and at this time still feeling stomach pains but much improved and taking some fluids PO  Daughter reports that in addition to the Nauseous, vomiting, diarrhea, upset stomach, running nose, loss of appetite pt also experienced dark urine Urine has now returned to clear.  Ezetimibe  removed from med list and added to allergy/intolerance list.  BRAT diet and other suggestions discussed to slow motility, ED and callback precautions given

## 2024-01-13 NOTE — Telephone Encounter (Signed)
 Pt c/o medication issue:  1. Name of Medication: ezetimibe  (ZETIA ) 10 MG tablet   2. How are you currently taking this medication (dosage and times per day)? Take 1 tablet (10 mg total) by mouth daily.   3. Are you having a reaction (difficulty breathing--STAT)? Nauseous, vomiting, diarrhea, upset stomach, running nose, loss of appetite.   4. What is your medication issue? Daughter believes medication is reacting poorly with kidneys

## 2024-01-13 NOTE — Telephone Encounter (Signed)
 Pt's daughter made aware and verbalized understanding. BMP order placed

## 2024-01-14 ENCOUNTER — Other Ambulatory Visit: Payer: Self-pay | Admitting: Medical

## 2024-01-17 DIAGNOSIS — Z79899 Other long term (current) drug therapy: Secondary | ICD-10-CM | POA: Diagnosis not present

## 2024-01-17 LAB — BASIC METABOLIC PANEL WITH GFR
BUN/Creatinine Ratio: 12 (ref 12–28)
BUN: 37 mg/dL — ABNORMAL HIGH (ref 8–27)
CO2: 19 mmol/L — ABNORMAL LOW (ref 20–29)
Calcium: 10.1 mg/dL (ref 8.7–10.3)
Chloride: 102 mmol/L (ref 96–106)
Creatinine, Ser: 3.02 mg/dL — ABNORMAL HIGH (ref 0.57–1.00)
Glucose: 113 mg/dL — ABNORMAL HIGH (ref 70–99)
Potassium: 4.1 mmol/L (ref 3.5–5.2)
Sodium: 143 mmol/L (ref 134–144)
eGFR: 16 mL/min/1.73 — ABNORMAL LOW (ref >59)

## 2024-01-21 ENCOUNTER — Ambulatory Visit: Payer: Self-pay | Admitting: Cardiology

## 2024-01-21 DIAGNOSIS — I10 Essential (primary) hypertension: Secondary | ICD-10-CM

## 2024-01-21 NOTE — Progress Notes (Signed)
 Recommend decreasing furosemide  and from 40 mg daily to 20 mg daily.  Slight bump in serum creatinine.  Would recommend repeating BMP in 2 weeks after reduced furosemide  dosing and forwarding results to her primary nephrologist.  Continue to avoid NSAIDs and maintain adequate hydration.

## 2024-02-02 ENCOUNTER — Other Ambulatory Visit: Payer: Self-pay | Admitting: Internal Medicine

## 2024-02-02 DIAGNOSIS — K219 Gastro-esophageal reflux disease without esophagitis: Secondary | ICD-10-CM

## 2024-02-04 NOTE — Telephone Encounter (Signed)
 Requested Prescriptions  Pending Prescriptions Disp Refills   pantoprazole  (PROTONIX ) 20 MG tablet [Pharmacy Med Name: PANTOPRAZOLE  SOD DR 20 MG TAB] 90 tablet 0    Sig: TAKE 1 TABLET BY MOUTH EVERY MORNING     Gastroenterology: Proton Pump Inhibitors Passed - 02/04/2024  9:18 AM      Passed - Valid encounter within last 12 months    Recent Outpatient Visits           1 month ago Controlled type 2 diabetes mellitus without complication, without long-term current use of insulin  [facility])   [facility] Bernardo Fend, DO   7 months ago Essential hypertension   [facility] Bernardo Fend, DO       Future Appointments             Tomorrow Maurine Lukes, PA-C [facility] [place]   In 4 months Bernardo Fend, DO [facility] [place]

## 2024-02-05 ENCOUNTER — Encounter: Payer: Self-pay | Admitting: Physician Assistant

## 2024-02-05 ENCOUNTER — Other Ambulatory Visit: Payer: Self-pay

## 2024-02-05 ENCOUNTER — Ambulatory Visit (INDEPENDENT_AMBULATORY_CARE_PROVIDER_SITE_OTHER): Admitting: Physician Assistant

## 2024-02-05 VITALS — BP 122/74 | HR 74 | Ht 61.0 in | Wt 197.8 lb

## 2024-02-05 DIAGNOSIS — I10 Essential (primary) hypertension: Secondary | ICD-10-CM | POA: Diagnosis not present

## 2024-02-05 DIAGNOSIS — Z435 Encounter for attention to cystostomy: Secondary | ICD-10-CM

## 2024-02-05 NOTE — Progress Notes (Signed)
 Suprapubic Cath Change  Patient is present today for a suprapubic catheter change due to urinary retention.  9ml of water  was drained from the balloon, a 16FR foley cath was removed from the tract with out difficulty.  Site was cleaned and prepped in a sterile fashion with betadine.  A 16FR foley cath was replaced into the tract no complications were noted. Urine return was noted, 10 ml of sterile water  was inflated into the balloon and a night bag was attached for drainage.  Patient tolerated well.   Performed by: Alexander Mcauley, PA-C   Follow up: Return in about 4 weeks (around 03/04/2024) for SPT exchange.

## 2024-02-05 NOTE — Addendum Note (Signed)
 Addended by: Lekeisha Arenas E on: 02/05/2024 11:13 AM   Modules accepted: Orders

## 2024-02-06 ENCOUNTER — Ambulatory Visit: Payer: Self-pay | Admitting: Cardiology

## 2024-02-06 DIAGNOSIS — Z79899 Other long term (current) drug therapy: Secondary | ICD-10-CM

## 2024-02-06 LAB — LIPID PANEL
Chol/HDL Ratio: 3.4 ratio (ref 0.0–4.4)
Cholesterol, Total: 205 mg/dL — ABNORMAL HIGH (ref 100–199)
HDL: 61 mg/dL (ref >39)
LDL Chol Calc (NIH): 127 mg/dL — ABNORMAL HIGH (ref 0–99)
Triglycerides: 93 mg/dL (ref 0–149)
VLDL Cholesterol Cal: 17 mg/dL (ref 5–40)

## 2024-02-06 LAB — BASIC METABOLIC PANEL WITH GFR
BUN/Creatinine Ratio: 9 — ABNORMAL LOW (ref 12–28)
BUN: 22 mg/dL (ref 8–27)
CO2: 20 mmol/L (ref 20–29)
Calcium: 9.7 mg/dL (ref 8.7–10.3)
Chloride: 102 mmol/L (ref 96–106)
Creatinine, Ser: 2.55 mg/dL — ABNORMAL HIGH (ref 0.57–1.00)
Glucose: 83 mg/dL (ref 70–99)
Potassium: 3.9 mmol/L (ref 3.5–5.2)
Sodium: 141 mmol/L (ref 134–144)
eGFR: 19 mL/min/1.73 — ABNORMAL LOW (ref >59)

## 2024-02-06 LAB — HEPATIC FUNCTION PANEL
ALT: 6 IU/L (ref 0–32)
AST: 16 IU/L (ref 0–40)
Albumin: 4.1 g/dL (ref 3.8–4.8)
Alkaline Phosphatase: 113 IU/L (ref 49–135)
Bilirubin Total: 0.5 mg/dL (ref 0.0–1.2)
Bilirubin, Direct: 0.15 mg/dL (ref 0.00–0.40)
Total Protein: 7.5 g/dL (ref 6.0–8.5)

## 2024-02-06 NOTE — Progress Notes (Signed)
 Kidney function has returned to baseline with reduction in diuretic.  Liver functions remain stable.  Bad cholesterol or LDL remains elevated.  Ensure taking previously prescribed ezetimibe  10 mg daily.  Will need repeat lipid and hepatic panel in 10 to 12 weeks.

## 2024-03-03 ENCOUNTER — Ambulatory Visit (INDEPENDENT_AMBULATORY_CARE_PROVIDER_SITE_OTHER): Admitting: Physician Assistant

## 2024-03-03 ENCOUNTER — Encounter: Payer: Self-pay | Admitting: Physician Assistant

## 2024-03-03 VITALS — BP 157/86 | HR 54 | Ht 61.0 in | Wt 198.8 lb

## 2024-03-03 DIAGNOSIS — R339 Retention of urine, unspecified: Secondary | ICD-10-CM | POA: Diagnosis not present

## 2024-03-03 DIAGNOSIS — R32 Unspecified urinary incontinence: Secondary | ICD-10-CM

## 2024-03-03 DIAGNOSIS — Z435 Encounter for attention to cystostomy: Secondary | ICD-10-CM

## 2024-03-03 NOTE — Progress Notes (Signed)
 Suprapubic Cath Change  Patient is present today for a suprapubic catheter change due to urinary retention.  8ml of water  was drained from the balloon, a 16FR foley cath was removed from the tract with out difficulty.  Site was cleaned and prepped in a sterile fashion with betadine.  A 16FR foley cath was replaced into the tract no complications were noted. Urine return was noted, 10 ml of sterile water  was inflated into the balloon and a night bag was attached for drainage.  Patient tolerated well.   Performed by: Sommer Spickard, PA-C   Additional notes: Rosemary requests annual refill of Ms. Strauss's incontinence supplies from [employer]. She is requesting the same products with some minor modifications: Increase glove size to large from medium, increase absorbent underwear size by one size, and consider an alternative style of absorbent underwear with a wider area of absorbency.  Follow up: Return in about 4 weeks (around 03/31/2024) for SPT exchange.

## 2024-04-02 ENCOUNTER — Ambulatory Visit: Admitting: Urology

## 2024-04-02 DIAGNOSIS — R339 Retention of urine, unspecified: Secondary | ICD-10-CM

## 2024-04-02 DIAGNOSIS — Z435 Encounter for attention to cystostomy: Secondary | ICD-10-CM

## 2024-04-02 NOTE — Progress Notes (Signed)
 Suprapubic Cath Change  Patient is present today for a suprapubic catheter change due to urinary retention.  8 ml of water  was drained from the balloon, a 16 FR foley cath was removed from the tract with out difficulty.  Site was cleaned and prepped in a sterile fashion with betadine.  A 16 FR coude foley cath was replaced into the tract no complications were noted. Urine return was noted, 10 ml of sterile water  was inflated into the balloon and a night bag was attached for drainage.  Patient tolerated well. A night bag was given to patient and proper instruction was given on how to switch bags.    Performed by: CLOTILDA CORNWALL, PA-C   Follow up: Return in about 1 month (around 05/03/2024) for SPT exchange.

## 2024-05-01 ENCOUNTER — Other Ambulatory Visit: Payer: Self-pay | Admitting: Internal Medicine

## 2024-05-01 DIAGNOSIS — K219 Gastro-esophageal reflux disease without esophagitis: Secondary | ICD-10-CM

## 2024-05-01 NOTE — Telephone Encounter (Signed)
 Requested Prescriptions  Pending Prescriptions Disp Refills   pantoprazole  (PROTONIX ) 20 MG tablet [Pharmacy Med Name: PANTOPRAZOLE  SOD DR 20 MG TAB] 90 tablet 1    Sig: TAKE 1 TABLET BY MOUTH EVERY MORNING     Gastroenterology: Proton Pump Inhibitors Passed - 05/01/2024  2:00 PM      Passed - Valid encounter within last 12 months    Recent Outpatient Visits           4 months ago Controlled type 2 diabetes mellitus without complication, without long-term current use of insulin  [facility])   [facility] Bernardo Fend, DO   10 months ago Essential hypertension   [facility] Bernardo Fend, DO       Future Appointments             In 6 days McGowan, Leslie [facility] [place]   In 1 month Bernardo Fend, DO [facility] [place]

## 2024-05-04 ENCOUNTER — Ambulatory Visit: Admitting: Urology

## 2024-05-05 NOTE — Progress Notes (Signed)
 Suprapubic Cath Change  Patient is present today for a suprapubic catheter change due to urinary retention.  8 ml of water  was drained from the balloon, a 16 FR foley cath was removed from the tract with out difficulty.  Site was cleaned and prepped in a sterile fashion with betadine.  A 16 FR foley cath was replaced into the tract no complications were noted. Urine return was noted, 10 ml of sterile water  was inflated into the balloon and a night bag was attached for drainage.  Patient tolerated well.   Performed by: CLOTILDA CORNWALL, PA-C and Andrea DELENA Kirks, LPN   Follow up: Return in about 1 month (around 06/07/2024) for SPT exchange.

## 2024-05-07 ENCOUNTER — Ambulatory Visit: Payer: Medicare Other | Admitting: Neurology

## 2024-05-07 ENCOUNTER — Ambulatory Visit (INDEPENDENT_AMBULATORY_CARE_PROVIDER_SITE_OTHER): Admitting: Urology

## 2024-05-07 ENCOUNTER — Telehealth: Payer: Self-pay | Admitting: Neurology

## 2024-05-07 ENCOUNTER — Encounter: Payer: Self-pay | Admitting: Neurology

## 2024-05-07 VITALS — BP 177/84 | HR 52 | Wt 200.5 lb

## 2024-05-07 DIAGNOSIS — Z435 Encounter for attention to cystostomy: Secondary | ICD-10-CM | POA: Diagnosis not present

## 2024-05-07 NOTE — Telephone Encounter (Signed)
 Sent Mychart and mailed letter informing pt of missed appt 05/07/24

## 2024-05-07 NOTE — Progress Notes (Unsigned)
 "  No chief complaint on file.     ASSESSMENT AND PLAN  Leslie Houston is a 74 y.o. female   Epilepsy, mild mental retardation, born premature Large left occipital stroke in 2016  Last recurrent seizure while taking Keppra  500/1000 on February 01, 2022  She was on Keppra  1000 mg twice a day, dosage was renally adjusted to current 500 mg twice a day since July, with GFR of 21, Keppra  level was 51 Newly diagnosed left lower extremity DVT  On Eliquis  now  Return To Clinic With NP In 12 Months okay with virtual visit   DIAGNOSTIC DATA (LABS, IMAGING, TESTING) - I reviewed patient records, labs, notes, testing and imaging myself where available.   MEDICAL HISTORY:  Leslie Houston, is a 74 year old female, accompanied by her friends, follow-up for epilepsy, her primary care physician is Dr. Bernardo, [place],  I reviewed and summarized the referring note. Depression Mild mental retardation Incontinence Stroke Obstructive sleep apnea Coronary artery disease, history of stent, Hypertension Hyperlipidemia  She has been patient of our clinic for a long time for epilepsy. 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   She had a history of epilepsy, generalized tonic-clonic seizure when she was young, since her most recent left occipital stroke early 2016, she began to have frequent spells, proceeding by smelling dusty smell, eyes crossed, transient confusion , she sometimes fell to the ground, lasting less than a minute, she can have few spells in one day.She presented to hospital multiple times for similar presentation, I have   MRI of brain in May 2016, large left occipital stroke, mild small vessel disease at supratentorium.   Echocardiogram April 2016, normal ejection fraction 55-60%, no significant structural abnormality,   Cardiac monitoring showed no significant abnormality,  She was started on Keppra  since 2015, initially complaint side effect, had a shorter trial of Topamax , because even worse GI side effect, went back on Keppra , on titrating dose, now taking Keppra  500-1 solvent  Hospital admission in 2021 for worsening kidney function, found to have urinary retention, unknown etiology, entertaining suprapubic catheter, which has caused a lot of stress  Most recurrent seizure was on February 01, 2022, walking out of the bathroom, she suddenly went into a trance, confused, was cought by her family at a standing position   She also complains of slow worsening gait abnormality, denies significant upper or lower extremity paresthesia  Was diagnosed with obstructive sleep apnea in the past, last sleep study was 10 years ago, old CPAP machine is no longer function  UPDATE Jan 8th 2025: She is accompanied by longtime friends at today's visit, from seizure standpoint, doing well tolerating Keppra  500 mg twice a day, last seizure was more than a year ago,  She had a rough 2024, multiple ED presentation, hospital admission in July, had a superior pubic catheter for urinary retention, had recurrent UTI, later diagnosed with DVT of left lower extremity, now on Eliquis , she had worsening chronic kidney disease, Keppra  dosage was decreased from previous once 1000 mg twice a day to current 500 mg twice a day, tolerating lower dose with no recurrent seizure  Update 05/07/24 SS:   PHYSICAL EXAM:   There were no vitals filed for this visit.    There  is no height or weight on file to calculate BMI.  PHYSICAL EXAMNIATION:  Gen: NAD, conversant, well nourised, well groomed                     Cardiovascular: Regular rate rhythm, no peripheral edema, warm, nontender. Eyes: Conjunctivae clear without exudates or  hemorrhage Neck: Supple, no carotid bruits. Pulmonary: Clear to auscultation bilaterally   NEUROLOGICAL EXAM:  MENTAL STATUS: Speech/cognition: Awake alert oriented, rely on her friend Donzell to provide history, hard of hearing, cooperative on examination CRANIAL NERVES: CN II:  Pupils are round equal and briskly reactive to light.  Right hemivisual field deficit CN III, IV, VI: extraocular movement are normal. No ptosis. CN V: Facial sensation is intact to light touch CN VII: Face is symmetric with normal eye closure  CN VIII: Hearing is normal to causal conversation. CN IX, X: Phonation is normal. CN XI: Head turning and shoulder shrug are intact  MOTOR: There is no pronator drift of out-stretched arms. Muscle bulk and tone are normal. Muscle strength is normal.  REFLEXES: Reflexes are 2+ and symmetric at the biceps, triceps, 3/3 knees, and ankles. Plantar responses are extensor bilaterally  SENSORY: Intact to light touch, pinprick and vibratory sensation are intact in fingers and toes.  COORDINATION: There is no trunk or limb dysmetria noted.  GAIT/STANCE: Need push-up to get up from seated position, wide-based, stiff, cautious, valgus knee  REVIEW OF SYSTEMS:  Full 14 system review of systems performed and notable only for as above All other review of systems were negative.   ALLERGIES: Allergies  Allergen Reactions   Bactrim  [Sulfamethoxazole -Trimethoprim ] Other (See Comments)    Hematuria, dysuria   Ezetimibe  Diarrhea, Nausea And Vomiting and Other (See Comments)    Nauseous, vomiting, diarrhea, upset stomach, running nose, loss of appetite symptoms appeared approx 3-4 days after starting meds   Latex Rash   Penicillin G Itching   Atorvastatin  Other (See Comments)    Myalgias    Other Hives    Adhesive tape   Amlodipine  Swelling    Leg swelling   Doxycycline      Blood in urine    HOME MEDICATIONS: Current Outpatient Medications  Medication Sig Dispense  Refill   cholecalciferol  (VITAMIN D3) 25 MCG (1000 UNIT) tablet Take 1,000 Units by mouth daily.     citalopram  (CELEXA ) 20 MG tablet Take 1 tablet (20 mg total) by mouth daily. 90 tablet 1   furosemide  (LASIX ) 40 MG tablet TAKE ONE TABLET (40 MG TOTAL) BY MOUTH DAILY. 90 tablet 3   hydrALAZINE  (APRESOLINE ) 10 MG tablet Take 10 mg by mouth 3 (three) times daily.     levETIRAcetam  (KEPPRA ) 500 MG tablet Take 1 tablet (500 mg total) by mouth 2 (two) times daily. 180 tablet 3   metoCLOPramide  (REGLAN ) 10 MG tablet Take 1 tablet (10 mg total) by mouth every 6 (six) hours as needed. 30 tablet 0   nitroGLYCERIN  (NITROSTAT ) 0.4 MG SL tablet Place 1 tablet (0.4 mg total) under the tongue every 5 (five) minutes as needed for chest pain. 30 tablet 12   pantoprazole  (PROTONIX ) 20 MG tablet TAKE 1 TABLET BY MOUTH EVERY MORNING 90 tablet 1   No current facility-administered medications for this visit.    PAST MEDICAL HISTORY: Past Medical History:  Diagnosis Date   (HFpEF) heart failure with preserved ejection fraction (HCC)    a. Pt reports in 2000 she had CHF, details unclear, outside hospital; b. 06/2019 Echo: EF 50-55%,  no rwma, Gr2 DD,  mildly dil LA, mild MR.   Acute left PCA stroke (HCC) 06/22/2014   Anxiety    a.) on BZO (alprazolam ) PRN   CAD (coronary artery disease)    a. Pt reports in 2000 she had a stent to a coronary artery, details unclear, outside hospital.   Carotid stenosis    a. CT angio head 07/2014 - 50-60% stenoses of prox ICA bilaterally.   CKD (chronic kidney disease), stage IV (HCC)    Depression    Diabetes mellitus (HCC)    Denies.   Edentulous    No upper teeth   Family hx of colon cancer 06/13/2018   Father diagnosed in his 65's   Gout    History of cataract extraction, left 03/21/2021   HOH (hard of hearing)    usually needs to read lips   Hyperlipidemia    Hypertension    Intellectual disability    a.) born premature, limited formal education, has never driven    Myalgia due to statin    Orthostatic hypotension    OSA (obstructive sleep apnea)    a.) not on nocturnal PAP therapy; old/nonfuntioning DME - referred for repeat PSG 01/2022 by neurology   Osteopenia 01/24/2018   DEXA Sept 2019   PONV (postoperative nausea and vomiting)    PTSD (post-traumatic stress disorder)        PUD (peptic ulcer disease)    PVC's (premature ventricular contractions)    Right sided weakness 05/2014   a.) s/p CVA   Seizures (HCC)    Sinus bradycardia    Syncope    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)     PAST SURGICAL HISTORY: Past Surgical History:  Procedure Laterality Date   BLADDER SURGERY     CATARACT EXTRACTION W/PHACO Left 03/21/2021   Procedure: CATARACT EXTRACTION PHACO AND INTRAOCULAR LENS PLACEMENT (IOC) left 5.86 00:38.0;  Surgeon: Jaye Fallow, MD;  Location: [facility];  Service: Ophthalmology;  Laterality: Left;  Latex   CESAREAN SECTION     CORONARY ANGIOPLASTY WITH STENT PLACEMENT     CYSTOSCOPY N/A 03/12/2022   Procedure: CYSTOSCOPY;  Surgeon: Penne Knee, MD;  Location: [facility];  Service: Urology;  Laterality: N/A;   DENTAL SURGERY     INSERTION OF SUPRAPUBIC CATHETER N/A 03/12/2022   Procedure: INSERTION OF SUPRAPUBIC CATHETER;  Surgeon: Penne Knee, MD;  Location: [facility];  Service: Urology;  Laterality: N/A;   LAPAROTOMY N/A 03/22/2022   Procedure: EXPLORATORY LAPAROTOMY; LYSIS OF ADHESIONS;  Surgeon: Tye Millet, DO;  Location: [facility];  Service: General;  Laterality: N/A;   RECTAL EXAM UNDER ANESTHESIA N/A 03/12/2022   Procedure: PELVIC EXAM UNDER ANESTHESIA;  Surgeon: Penne Knee, MD;  Location: [facility];  Service: Urology;  Laterality: N/A;    FAMILY HISTORY: Family History  Problem Relation Age of Onset   Stroke Mother    Heart attack Mother    Lung cancer Mother    Stroke Father    Prostate cancer Father    Colon cancer Father    Diabetes Mellitus II Daughter     Multiple sclerosis Daughter    Cancer Paternal Grandfather    Kidney disease Son    Heart failure Son    Breast cancer Neg Hx     SOCIAL HISTORY: Social History   Socioeconomic History   Marital status: Single    Spouse name: Not on file   Number of children: 2   Years of education: 11  Highest education level: Not on file  Occupational History   Occupation: Disabled  Tobacco Use   Smoking status: Former    Current packs/day: 0.00    Types: Cigarettes    Quit date: 03/30/2014    Years since quitting: 10.1    Passive exposure: Past   Smokeless tobacco: Never  Vaping Use   Vaping status: Never Used  Substance and Sexual Activity   Alcohol use: No   Drug use: No   Sexual activity: Not Currently  Other Topics Concern   Not on file  Social History Narrative   Lives at home with her son's father.   Right-handed.   1 cup coffee per day.   Social Drivers of Health   Tobacco Use: Medium Risk (02/05/2024)   Patient History    Smoking Tobacco Use: Former    Smokeless Tobacco Use: Never    Passive Exposure: Past  Physicist, Medical Strain: Low Risk (10/24/2023)   Overall Financial Resource Strain (CARDIA)    Difficulty of Paying Living Expenses: Not hard at all  Food Insecurity: No Food Insecurity (10/24/2023)   Epic    Worried About Programme Researcher, Broadcasting/film/video in the Last Year: Never true    Ran Out of Food in the Last Year: Never true  Transportation Needs: No Transportation Needs (10/24/2023)   Epic    Lack of Transportation (Medical): No    Lack of Transportation (Non-Medical): No  Physical Activity: Sufficiently Active (10/24/2023)   Exercise Vital Sign    Days of Exercise per Week: 3 days    Minutes of Exercise per Session: 60 min  Stress: No Stress Concern Present (10/24/2023)   [employer] of Occupational Health - Occupational Stress Questionnaire    Feeling of Stress: Not at all  Social Connections: Socially Isolated (10/24/2023)   Social Connection and Isolation  Panel    Frequency of Communication with Friends and Family: More than three times a week    Frequency of Social Gatherings with Friends and Family: More than three times a week    Attends Religious Services: Never    Database Administrator or Organizations: No    Attends Banker Meetings: Never    Marital Status: Divorced  Catering Manager Violence: Not At Risk (10/24/2023)   Epic    Fear of Current or Ex-Partner: No    Emotionally Abused: No    Physically Abused: No    Sexually Abused: No  Depression (PHQ2-9): Low Risk (12/23/2023)   Depression (PHQ2-9)    PHQ-2 Score: 0  Alcohol Screen: Low Risk (10/24/2023)   Alcohol Screen    Last Alcohol Screening Score (AUDIT): 0  Housing: Unknown (10/24/2023)   Epic    Unable to Pay for Housing in the Last Year: No    Number of Times Moved in the Last Year: Not on file    Homeless in the Last Year: No  Utilities: Not At Risk (10/24/2023)   Epic    Threatened with loss of utilities: No  Health Literacy: Adequate Health Literacy (10/24/2023)   B1300 Health Literacy    Frequency of need for help with medical instructions: Never   Lauraine Gayland MANDES, DNP  [facility] [address] [place], [place] [address] [phone]  "

## 2024-05-08 ENCOUNTER — Other Ambulatory Visit: Payer: Self-pay | Admitting: Neurology

## 2024-05-08 NOTE — Telephone Encounter (Signed)
 Last seen on 05/08/23 No follow up scheduled   Rx has been approved, phone room please call patient and schedule updated visit.

## 2024-06-02 ENCOUNTER — Telehealth: Payer: Self-pay | Admitting: Neurology

## 2024-06-02 NOTE — Telephone Encounter (Signed)
 Appointment R/S due to weather

## 2024-06-03 ENCOUNTER — Ambulatory Visit: Admitting: Neurology

## 2024-06-04 ENCOUNTER — Ambulatory Visit: Admitting: Urology

## 2024-06-10 ENCOUNTER — Ambulatory Visit: Admitting: Urology

## 2024-06-24 ENCOUNTER — Ambulatory Visit: Admitting: Internal Medicine

## 2024-08-03 ENCOUNTER — Ambulatory Visit: Admitting: Physician Assistant

## 2024-10-21 ENCOUNTER — Ambulatory Visit: Admitting: Neurology

## 2024-11-05 ENCOUNTER — Ambulatory Visit
